# Patient Record
Sex: Male | Born: 1940 | Race: White | Hispanic: No | Marital: Married | Smoking: Never smoker
Health system: Southern US, Community
[De-identification: ages and names within clinical notes are randomized; demographics above are authoritative.]

## PROBLEM LIST (undated history)

## (undated) DIAGNOSIS — N200 Calculus of kidney: Secondary | ICD-10-CM

## (undated) DIAGNOSIS — M1990 Unspecified osteoarthritis, unspecified site: Secondary | ICD-10-CM

## (undated) DIAGNOSIS — I10 Essential (primary) hypertension: Secondary | ICD-10-CM

## (undated) DIAGNOSIS — E039 Hypothyroidism, unspecified: Secondary | ICD-10-CM

## (undated) DIAGNOSIS — E119 Type 2 diabetes mellitus without complications: Secondary | ICD-10-CM

## (undated) DIAGNOSIS — T8859XA Other complications of anesthesia, initial encounter: Secondary | ICD-10-CM

## (undated) DIAGNOSIS — K219 Gastro-esophageal reflux disease without esophagitis: Secondary | ICD-10-CM

## (undated) DIAGNOSIS — I4891 Unspecified atrial fibrillation: Secondary | ICD-10-CM

## (undated) DIAGNOSIS — N183 Chronic kidney disease, stage 3 unspecified: Secondary | ICD-10-CM

## (undated) DIAGNOSIS — I509 Heart failure, unspecified: Secondary | ICD-10-CM

## (undated) HISTORY — DX: Unspecified osteoarthritis, unspecified site: M19.90

## (undated) HISTORY — DX: Type 2 diabetes mellitus without complications: E11.9

---

## 1992-01-21 HISTORY — PX: NASAL FRACTURE SURGERY: SHX718

## 2004-08-26 ENCOUNTER — Ambulatory Visit: Payer: Self-pay | Admitting: Family Medicine

## 2006-12-15 ENCOUNTER — Ambulatory Visit: Payer: Self-pay | Admitting: Unknown Physician Specialty

## 2008-04-15 ENCOUNTER — Emergency Department (HOSPITAL_COMMUNITY): Admission: EM | Admit: 2008-04-15 | Discharge: 2008-04-16 | Payer: Self-pay | Admitting: Emergency Medicine

## 2008-12-11 ENCOUNTER — Ambulatory Visit: Payer: Self-pay | Admitting: Internal Medicine

## 2008-12-20 ENCOUNTER — Ambulatory Visit: Payer: Self-pay | Admitting: Internal Medicine

## 2008-12-29 ENCOUNTER — Ambulatory Visit: Payer: Self-pay | Admitting: Internal Medicine

## 2009-01-10 ENCOUNTER — Ambulatory Visit: Payer: Self-pay | Admitting: Internal Medicine

## 2009-01-24 ENCOUNTER — Ambulatory Visit: Payer: Self-pay | Admitting: Internal Medicine

## 2009-02-05 ENCOUNTER — Ambulatory Visit: Payer: Self-pay | Admitting: General Practice

## 2009-05-24 ENCOUNTER — Ambulatory Visit: Payer: Self-pay | Admitting: Urology

## 2010-05-02 LAB — URINALYSIS, ROUTINE W REFLEX MICROSCOPIC
Nitrite: NEGATIVE
Specific Gravity, Urine: 1.026 (ref 1.005–1.030)
Urobilinogen, UA: 0.2 mg/dL (ref 0.0–1.0)
pH: 5.5 (ref 5.0–8.0)

## 2010-05-02 LAB — POCT I-STAT, CHEM 8
Chloride: 105 mEq/L (ref 96–112)
Creatinine, Ser: 1.6 mg/dL — ABNORMAL HIGH (ref 0.4–1.5)
HCT: 39 % (ref 39.0–52.0)
Hemoglobin: 13.3 g/dL (ref 13.0–17.0)
Potassium: 3.8 mEq/L (ref 3.5–5.1)
Sodium: 140 mEq/L (ref 135–145)

## 2010-05-02 LAB — URINE MICROSCOPIC-ADD ON

## 2011-04-04 DIAGNOSIS — L299 Pruritus, unspecified: Secondary | ICD-10-CM | POA: Diagnosis not present

## 2011-04-04 DIAGNOSIS — L738 Other specified follicular disorders: Secondary | ICD-10-CM | POA: Diagnosis not present

## 2011-04-04 DIAGNOSIS — L280 Lichen simplex chronicus: Secondary | ICD-10-CM | POA: Diagnosis not present

## 2011-04-15 DIAGNOSIS — M25569 Pain in unspecified knee: Secondary | ICD-10-CM | POA: Diagnosis not present

## 2011-04-24 DIAGNOSIS — L280 Lichen simplex chronicus: Secondary | ICD-10-CM | POA: Diagnosis not present

## 2011-05-07 DIAGNOSIS — D649 Anemia, unspecified: Secondary | ICD-10-CM | POA: Diagnosis not present

## 2011-05-07 DIAGNOSIS — G4762 Sleep related leg cramps: Secondary | ICD-10-CM | POA: Diagnosis not present

## 2011-05-07 DIAGNOSIS — M2550 Pain in unspecified joint: Secondary | ICD-10-CM | POA: Diagnosis not present

## 2011-07-09 DIAGNOSIS — N401 Enlarged prostate with lower urinary tract symptoms: Secondary | ICD-10-CM | POA: Diagnosis not present

## 2011-07-09 DIAGNOSIS — N400 Enlarged prostate without lower urinary tract symptoms: Secondary | ICD-10-CM | POA: Diagnosis not present

## 2011-07-09 DIAGNOSIS — N529 Male erectile dysfunction, unspecified: Secondary | ICD-10-CM | POA: Diagnosis not present

## 2011-08-08 DIAGNOSIS — E119 Type 2 diabetes mellitus without complications: Secondary | ICD-10-CM | POA: Diagnosis not present

## 2011-11-10 DIAGNOSIS — E119 Type 2 diabetes mellitus without complications: Secondary | ICD-10-CM | POA: Diagnosis not present

## 2011-11-10 DIAGNOSIS — M549 Dorsalgia, unspecified: Secondary | ICD-10-CM | POA: Diagnosis not present

## 2011-11-12 DIAGNOSIS — H2510 Age-related nuclear cataract, unspecified eye: Secondary | ICD-10-CM | POA: Diagnosis not present

## 2012-03-19 DIAGNOSIS — R109 Unspecified abdominal pain: Secondary | ICD-10-CM | POA: Diagnosis not present

## 2012-03-19 DIAGNOSIS — E119 Type 2 diabetes mellitus without complications: Secondary | ICD-10-CM | POA: Diagnosis not present

## 2012-03-22 DIAGNOSIS — R195 Other fecal abnormalities: Secondary | ICD-10-CM | POA: Diagnosis not present

## 2012-03-22 DIAGNOSIS — K5730 Diverticulosis of large intestine without perforation or abscess without bleeding: Secondary | ICD-10-CM | POA: Diagnosis not present

## 2012-03-22 DIAGNOSIS — R1032 Left lower quadrant pain: Secondary | ICD-10-CM | POA: Diagnosis not present

## 2012-03-30 DIAGNOSIS — R7989 Other specified abnormal findings of blood chemistry: Secondary | ICD-10-CM | POA: Diagnosis not present

## 2012-03-30 DIAGNOSIS — R198 Other specified symptoms and signs involving the digestive system and abdomen: Secondary | ICD-10-CM | POA: Diagnosis not present

## 2012-03-30 DIAGNOSIS — R1032 Left lower quadrant pain: Secondary | ICD-10-CM | POA: Diagnosis not present

## 2012-03-30 DIAGNOSIS — R7982 Elevated C-reactive protein (CRP): Secondary | ICD-10-CM | POA: Diagnosis not present

## 2012-04-01 ENCOUNTER — Ambulatory Visit: Payer: Self-pay | Admitting: Unknown Physician Specialty

## 2012-04-01 DIAGNOSIS — N200 Calculus of kidney: Secondary | ICD-10-CM | POA: Diagnosis not present

## 2012-04-01 DIAGNOSIS — IMO0002 Reserved for concepts with insufficient information to code with codable children: Secondary | ICD-10-CM | POA: Diagnosis not present

## 2012-04-01 DIAGNOSIS — R198 Other specified symptoms and signs involving the digestive system and abdomen: Secondary | ICD-10-CM | POA: Diagnosis not present

## 2012-04-01 DIAGNOSIS — K4090 Unilateral inguinal hernia, without obstruction or gangrene, not specified as recurrent: Secondary | ICD-10-CM | POA: Diagnosis not present

## 2012-04-03 DIAGNOSIS — Z79899 Other long term (current) drug therapy: Secondary | ICD-10-CM | POA: Diagnosis not present

## 2012-04-06 DIAGNOSIS — N200 Calculus of kidney: Secondary | ICD-10-CM | POA: Diagnosis not present

## 2012-04-13 ENCOUNTER — Ambulatory Visit: Payer: Self-pay | Admitting: Urology

## 2012-04-13 DIAGNOSIS — N200 Calculus of kidney: Secondary | ICD-10-CM | POA: Diagnosis not present

## 2012-04-13 DIAGNOSIS — Z01812 Encounter for preprocedural laboratory examination: Secondary | ICD-10-CM | POA: Diagnosis not present

## 2012-04-13 LAB — BASIC METABOLIC PANEL
Chloride: 106 mmol/L (ref 98–107)
Co2: 30 mmol/L (ref 21–32)
EGFR (African American): >60
Osmolality: 282 (ref 275–301)
Sodium: 139 mmol/L (ref 136–145)

## 2012-04-15 ENCOUNTER — Ambulatory Visit: Payer: Self-pay | Admitting: Urology

## 2012-04-15 DIAGNOSIS — Z79899 Other long term (current) drug therapy: Secondary | ICD-10-CM | POA: Diagnosis not present

## 2012-04-15 DIAGNOSIS — N200 Calculus of kidney: Secondary | ICD-10-CM | POA: Diagnosis not present

## 2012-04-15 DIAGNOSIS — E119 Type 2 diabetes mellitus without complications: Secondary | ICD-10-CM | POA: Diagnosis not present

## 2012-04-15 DIAGNOSIS — N400 Enlarged prostate without lower urinary tract symptoms: Secondary | ICD-10-CM | POA: Diagnosis not present

## 2012-04-21 DIAGNOSIS — N200 Calculus of kidney: Secondary | ICD-10-CM | POA: Diagnosis not present

## 2012-05-10 DIAGNOSIS — R109 Unspecified abdominal pain: Secondary | ICD-10-CM | POA: Diagnosis not present

## 2012-05-10 DIAGNOSIS — E119 Type 2 diabetes mellitus without complications: Secondary | ICD-10-CM | POA: Diagnosis not present

## 2012-05-18 DIAGNOSIS — R944 Abnormal results of kidney function studies: Secondary | ICD-10-CM | POA: Diagnosis not present

## 2012-05-18 DIAGNOSIS — E119 Type 2 diabetes mellitus without complications: Secondary | ICD-10-CM | POA: Diagnosis not present

## 2012-05-18 DIAGNOSIS — R109 Unspecified abdominal pain: Secondary | ICD-10-CM | POA: Diagnosis not present

## 2012-05-18 DIAGNOSIS — D649 Anemia, unspecified: Secondary | ICD-10-CM | POA: Diagnosis not present

## 2012-05-26 DIAGNOSIS — Z0189 Encounter for other specified special examinations: Secondary | ICD-10-CM | POA: Diagnosis not present

## 2012-05-26 DIAGNOSIS — Z85828 Personal history of other malignant neoplasm of skin: Secondary | ICD-10-CM | POA: Diagnosis not present

## 2012-05-26 DIAGNOSIS — L570 Actinic keratosis: Secondary | ICD-10-CM | POA: Diagnosis not present

## 2012-05-26 DIAGNOSIS — L821 Other seborrheic keratosis: Secondary | ICD-10-CM | POA: Diagnosis not present

## 2012-06-09 DIAGNOSIS — D649 Anemia, unspecified: Secondary | ICD-10-CM | POA: Diagnosis not present

## 2012-06-09 DIAGNOSIS — R944 Abnormal results of kidney function studies: Secondary | ICD-10-CM | POA: Diagnosis not present

## 2012-08-09 DIAGNOSIS — D649 Anemia, unspecified: Secondary | ICD-10-CM | POA: Diagnosis not present

## 2012-08-09 DIAGNOSIS — E119 Type 2 diabetes mellitus without complications: Secondary | ICD-10-CM | POA: Diagnosis not present

## 2012-08-09 DIAGNOSIS — R109 Unspecified abdominal pain: Secondary | ICD-10-CM | POA: Diagnosis not present

## 2012-11-18 DIAGNOSIS — H2510 Age-related nuclear cataract, unspecified eye: Secondary | ICD-10-CM | POA: Diagnosis not present

## 2012-12-13 DIAGNOSIS — E119 Type 2 diabetes mellitus without complications: Secondary | ICD-10-CM | POA: Diagnosis not present

## 2012-12-13 DIAGNOSIS — M549 Dorsalgia, unspecified: Secondary | ICD-10-CM | POA: Diagnosis not present

## 2012-12-13 DIAGNOSIS — R109 Unspecified abdominal pain: Secondary | ICD-10-CM | POA: Diagnosis not present

## 2013-01-20 HISTORY — PX: URETEROLITHOTOMY: SHX71

## 2013-04-22 DIAGNOSIS — S01309A Unspecified open wound of unspecified ear, initial encounter: Secondary | ICD-10-CM | POA: Diagnosis not present

## 2013-04-22 DIAGNOSIS — Z23 Encounter for immunization: Secondary | ICD-10-CM | POA: Diagnosis not present

## 2013-04-22 DIAGNOSIS — J029 Acute pharyngitis, unspecified: Secondary | ICD-10-CM | POA: Diagnosis not present

## 2013-05-18 DIAGNOSIS — N200 Calculus of kidney: Secondary | ICD-10-CM | POA: Diagnosis not present

## 2013-05-18 DIAGNOSIS — N401 Enlarged prostate with lower urinary tract symptoms: Secondary | ICD-10-CM | POA: Diagnosis not present

## 2013-05-18 DIAGNOSIS — N138 Other obstructive and reflux uropathy: Secondary | ICD-10-CM | POA: Diagnosis not present

## 2013-06-08 DIAGNOSIS — E119 Type 2 diabetes mellitus without complications: Secondary | ICD-10-CM | POA: Diagnosis not present

## 2013-06-08 DIAGNOSIS — R109 Unspecified abdominal pain: Secondary | ICD-10-CM | POA: Diagnosis not present

## 2013-07-18 DIAGNOSIS — M5116 Intervertebral disc disorders with radiculopathy, lumbar region: Secondary | ICD-10-CM | POA: Insufficient documentation

## 2013-07-18 DIAGNOSIS — D638 Anemia in other chronic diseases classified elsewhere: Secondary | ICD-10-CM | POA: Insufficient documentation

## 2013-07-18 DIAGNOSIS — IMO0002 Reserved for concepts with insufficient information to code with codable children: Secondary | ICD-10-CM | POA: Diagnosis not present

## 2013-07-18 DIAGNOSIS — M47817 Spondylosis without myelopathy or radiculopathy, lumbosacral region: Secondary | ICD-10-CM | POA: Diagnosis not present

## 2013-07-18 DIAGNOSIS — M47816 Spondylosis without myelopathy or radiculopathy, lumbar region: Secondary | ICD-10-CM | POA: Insufficient documentation

## 2013-07-18 DIAGNOSIS — D649 Anemia, unspecified: Secondary | ICD-10-CM | POA: Insufficient documentation

## 2013-08-02 DIAGNOSIS — R109 Unspecified abdominal pain: Secondary | ICD-10-CM | POA: Diagnosis not present

## 2013-08-02 DIAGNOSIS — R319 Hematuria, unspecified: Secondary | ICD-10-CM | POA: Diagnosis not present

## 2013-08-02 DIAGNOSIS — N400 Enlarged prostate without lower urinary tract symptoms: Secondary | ICD-10-CM | POA: Diagnosis not present

## 2013-08-10 DIAGNOSIS — N200 Calculus of kidney: Secondary | ICD-10-CM | POA: Diagnosis not present

## 2013-08-11 ENCOUNTER — Ambulatory Visit: Payer: Self-pay | Admitting: Urology

## 2013-08-11 DIAGNOSIS — K5710 Diverticulosis of small intestine without perforation or abscess without bleeding: Secondary | ICD-10-CM | POA: Diagnosis not present

## 2013-08-11 DIAGNOSIS — R109 Unspecified abdominal pain: Secondary | ICD-10-CM | POA: Diagnosis not present

## 2013-08-11 DIAGNOSIS — N1330 Unspecified hydronephrosis: Secondary | ICD-10-CM | POA: Diagnosis not present

## 2013-08-11 DIAGNOSIS — E119 Type 2 diabetes mellitus without complications: Secondary | ICD-10-CM | POA: Diagnosis not present

## 2013-08-11 DIAGNOSIS — Z01810 Encounter for preprocedural cardiovascular examination: Secondary | ICD-10-CM | POA: Diagnosis not present

## 2013-08-11 DIAGNOSIS — Z01812 Encounter for preprocedural laboratory examination: Secondary | ICD-10-CM | POA: Diagnosis not present

## 2013-08-11 LAB — BASIC METABOLIC PANEL
ANION GAP: 6 — AB (ref 7–16)
BUN: 35 mg/dL — ABNORMAL HIGH (ref 7–18)
CALCIUM: 9 mg/dL (ref 8.5–10.1)
CHLORIDE: 102 mmol/L (ref 98–107)
CO2: 31 mmol/L (ref 21–32)
Creatinine: 2.11 mg/dL — ABNORMAL HIGH (ref 0.60–1.30)
GFR CALC AF AMER: 35 — AB
GFR CALC NON AF AMER: 30 — AB
Glucose: 129 mg/dL — ABNORMAL HIGH (ref 65–99)
Osmolality: 287 (ref 275–301)
POTASSIUM: 4 mmol/L (ref 3.5–5.1)
SODIUM: 139 mmol/L (ref 136–145)

## 2013-08-15 ENCOUNTER — Inpatient Hospital Stay: Payer: Self-pay | Admitting: Internal Medicine

## 2013-08-15 DIAGNOSIS — I10 Essential (primary) hypertension: Secondary | ICD-10-CM | POA: Diagnosis not present

## 2013-08-15 DIAGNOSIS — N400 Enlarged prostate without lower urinary tract symptoms: Secondary | ICD-10-CM | POA: Diagnosis not present

## 2013-08-15 DIAGNOSIS — E119 Type 2 diabetes mellitus without complications: Secondary | ICD-10-CM | POA: Diagnosis present

## 2013-08-15 DIAGNOSIS — N201 Calculus of ureter: Secondary | ICD-10-CM | POA: Diagnosis not present

## 2013-08-15 DIAGNOSIS — M129 Arthropathy, unspecified: Secondary | ICD-10-CM | POA: Diagnosis present

## 2013-08-15 DIAGNOSIS — N1330 Unspecified hydronephrosis: Secondary | ICD-10-CM | POA: Diagnosis not present

## 2013-08-15 DIAGNOSIS — N179 Acute kidney failure, unspecified: Secondary | ICD-10-CM | POA: Diagnosis not present

## 2013-08-15 DIAGNOSIS — Z4682 Encounter for fitting and adjustment of non-vascular catheter: Secondary | ICD-10-CM | POA: Diagnosis not present

## 2013-08-15 DIAGNOSIS — J9600 Acute respiratory failure, unspecified whether with hypoxia or hypercapnia: Secondary | ICD-10-CM | POA: Diagnosis not present

## 2013-08-15 DIAGNOSIS — R918 Other nonspecific abnormal finding of lung field: Secondary | ICD-10-CM | POA: Diagnosis not present

## 2013-08-15 DIAGNOSIS — J95821 Acute postprocedural respiratory failure: Secondary | ICD-10-CM | POA: Diagnosis present

## 2013-08-15 LAB — CBC WITH DIFFERENTIAL/PLATELET
Basophil #: 0 10*3/uL (ref 0.0–0.1)
Basophil %: 0.5 %
Eosinophil #: 0.1 10*3/uL (ref 0.0–0.7)
Eosinophil %: 0.7 %
HCT: 38 % — ABNORMAL LOW (ref 40.0–52.0)
HGB: 12.6 g/dL — ABNORMAL LOW (ref 13.0–18.0)
Lymphocyte #: 0.5 10*3/uL — ABNORMAL LOW (ref 1.0–3.6)
Lymphocyte %: 4.7 %
MCH: 31.6 pg (ref 26.0–34.0)
MCHC: 33.1 g/dL (ref 32.0–36.0)
MCV: 96 fL (ref 80–100)
Monocyte #: 0.3 x10 3/mm (ref 0.2–1.0)
Monocyte %: 2.5 %
Neutrophil #: 9.6 10*3/uL — ABNORMAL HIGH (ref 1.4–6.5)
Neutrophil %: 91.6 %
Platelet: 290 10*3/uL (ref 150–440)
RBC: 3.98 10*6/uL — ABNORMAL LOW (ref 4.40–5.90)
RDW: 13.1 % (ref 11.5–14.5)
WBC: 10.5 10*3/uL (ref 3.8–10.6)

## 2013-08-15 LAB — BASIC METABOLIC PANEL
Anion Gap: 9 (ref 7–16)
BUN: 18 mg/dL (ref 7–18)
CALCIUM: 8.4 mg/dL — AB (ref 8.5–10.1)
CHLORIDE: 106 mmol/L (ref 98–107)
Co2: 23 mmol/L (ref 21–32)
Creatinine: 1.58 mg/dL — ABNORMAL HIGH (ref 0.60–1.30)
EGFR (Non-African Amer.): 43 — ABNORMAL LOW
GFR CALC AF AMER: 50 — AB
Glucose: 169 mg/dL — ABNORMAL HIGH (ref 65–99)
Osmolality: 281 (ref 275–301)
Potassium: 3.8 mmol/L (ref 3.5–5.1)
Sodium: 138 mmol/L (ref 136–145)

## 2013-08-15 LAB — MAGNESIUM: Magnesium: 1.8 mg/dL

## 2013-08-16 LAB — BASIC METABOLIC PANEL
Anion Gap: 13 (ref 7–16)
BUN: 23 mg/dL — AB (ref 7–18)
Calcium, Total: 8.3 mg/dL — ABNORMAL LOW (ref 8.5–10.1)
Chloride: 104 mmol/L (ref 98–107)
Co2: 20 mmol/L — ABNORMAL LOW (ref 21–32)
Creatinine: 1.84 mg/dL — ABNORMAL HIGH (ref 0.60–1.30)
EGFR (African American): 42 — ABNORMAL LOW
GFR CALC NON AF AMER: 36 — AB
Glucose: 227 mg/dL — ABNORMAL HIGH (ref 65–99)
Osmolality: 285 (ref 275–301)
Potassium: 4.1 mmol/L (ref 3.5–5.1)
Sodium: 137 mmol/L (ref 136–145)

## 2013-08-16 LAB — CBC WITH DIFFERENTIAL/PLATELET
Basophil #: 0 10*3/uL (ref 0.0–0.1)
Basophil %: 0.1 %
Eosinophil #: 0 10*3/uL (ref 0.0–0.7)
Eosinophil %: 0.1 %
HCT: 34.7 % — AB (ref 40.0–52.0)
HGB: 12 g/dL — AB (ref 13.0–18.0)
LYMPHS PCT: 4 %
Lymphocyte #: 0.4 10*3/uL — ABNORMAL LOW (ref 1.0–3.6)
MCH: 32.4 pg (ref 26.0–34.0)
MCHC: 34.4 g/dL (ref 32.0–36.0)
MCV: 94 fL (ref 80–100)
Monocyte #: 0.2 x10 3/mm (ref 0.2–1.0)
Monocyte %: 1.8 %
NEUTROS PCT: 94 %
Neutrophil #: 8.3 10*3/uL — ABNORMAL HIGH (ref 1.4–6.5)
PLATELETS: 298 10*3/uL (ref 150–440)
RBC: 3.69 10*6/uL — ABNORMAL LOW (ref 4.40–5.90)
RDW: 13 % (ref 11.5–14.5)
WBC: 8.9 10*3/uL (ref 3.8–10.6)

## 2013-08-17 LAB — BASIC METABOLIC PANEL
ANION GAP: 10 (ref 7–16)
BUN: 30 mg/dL — AB (ref 7–18)
CALCIUM: 8.7 mg/dL (ref 8.5–10.1)
CHLORIDE: 107 mmol/L (ref 98–107)
Co2: 21 mmol/L (ref 21–32)
Creatinine: 1.74 mg/dL — ABNORMAL HIGH (ref 0.60–1.30)
GFR CALC AF AMER: 44 — AB
GFR CALC NON AF AMER: 38 — AB
GLUCOSE: 163 mg/dL — AB (ref 65–99)
OSMOLALITY: 285 (ref 275–301)
Potassium: 4.1 mmol/L (ref 3.5–5.1)
Sodium: 138 mmol/L (ref 136–145)

## 2013-08-22 DIAGNOSIS — N201 Calculus of ureter: Secondary | ICD-10-CM | POA: Diagnosis not present

## 2013-08-22 DIAGNOSIS — N1330 Unspecified hydronephrosis: Secondary | ICD-10-CM | POA: Diagnosis not present

## 2013-08-22 DIAGNOSIS — N401 Enlarged prostate with lower urinary tract symptoms: Secondary | ICD-10-CM | POA: Diagnosis not present

## 2013-09-05 DIAGNOSIS — R109 Unspecified abdominal pain: Secondary | ICD-10-CM | POA: Diagnosis not present

## 2013-09-05 DIAGNOSIS — E119 Type 2 diabetes mellitus without complications: Secondary | ICD-10-CM | POA: Diagnosis not present

## 2013-09-05 DIAGNOSIS — R5381 Other malaise: Secondary | ICD-10-CM | POA: Diagnosis not present

## 2013-11-09 DIAGNOSIS — E119 Type 2 diabetes mellitus without complications: Secondary | ICD-10-CM | POA: Diagnosis not present

## 2013-11-09 DIAGNOSIS — Z1389 Encounter for screening for other disorder: Secondary | ICD-10-CM | POA: Diagnosis not present

## 2013-11-09 DIAGNOSIS — R109 Unspecified abdominal pain: Secondary | ICD-10-CM | POA: Diagnosis not present

## 2013-11-09 DIAGNOSIS — M549 Dorsalgia, unspecified: Secondary | ICD-10-CM | POA: Diagnosis not present

## 2013-11-09 DIAGNOSIS — R296 Repeated falls: Secondary | ICD-10-CM | POA: Diagnosis not present

## 2013-11-21 DIAGNOSIS — E139 Other specified diabetes mellitus without complications: Secondary | ICD-10-CM | POA: Diagnosis not present

## 2014-02-21 DIAGNOSIS — M25561 Pain in right knee: Secondary | ICD-10-CM | POA: Diagnosis not present

## 2014-04-17 DIAGNOSIS — R109 Unspecified abdominal pain: Secondary | ICD-10-CM | POA: Diagnosis not present

## 2014-04-17 DIAGNOSIS — E119 Type 2 diabetes mellitus without complications: Secondary | ICD-10-CM | POA: Diagnosis not present

## 2014-05-13 NOTE — H&P (Signed)
PATIENT NAME:  SHOGO, LARKEY MR#:  [id number] DATE OF BIRTH:  06-16-1940  DATE OF ADMISSION:  08/15/2013  REFERRING PHYSICIAN:  Dr. Maryan Puls.   CHIEF COMPLAINT: Respiratory distress.   HISTORY OF PRESENT ILLNESS: A 74 year old male with past history of hypertension, benign prostatic hypertrophy and  diabetes, who had renal stone issues on the right side and was scheduled for outpatient surgery, taken for the procedure today and stenting in the right side ureter was done by Dr. Maryan Puls. Postprocedure, the patient had respiratory distress and had some stridor, as per the nursing staff and anesthesiologist, and so finally they decided to intubate the patient and put him on the ventilator machine and medical consult was called in for further management of this issue. History obtained from patient's wife and daughter and nursing staff and postsurgical unit.    REVIEW OF SYSTEMS:  Unable to get as the patient is intubated on ventilator.   PAST MEDICAL HISTORY: 1.  Benign prostatic hypertrophy.  2.  Renal stone.  3.  Diabetes.  4.  Arthritis.   SOCIAL HISTORY: Obtained from patient's wife; denies smoking, drinking alcohol or doing any drugs. Lives active and healthy life with family.   FAMILY HISTORY: Positive for emphysema in the patient's mother who was a known smoker.   HOME MEDICATIONS:  1.  Tramadol 50 mg oral tablet once a day.  2.  TheraLith XR multivitamin, 2 tablets once a day.  3.  Tamsulosin 0.4 mg oral once a day.  4.  Ondansetron 8 mg oral every 4 hours.  5.  Omeprazole 20 mg oral once a day.  6.  Nucynta 50 mg oral tablet every 6 hours.  7.  Multivitamin with minerals once a day.  8.  Metformin 500 mg oral 2 times a day.  9.  Hyoscine 0.125 mg oral sublingual as needed.  10.  Docusate sodium 50 mg oral 2 times a day.  11.  Acetaminophen 500 mg oral tablet every 6 hours as needed for pain.   PHYSICAL EXAMINATION: VITAL SIGNS: In ER, temperature 97.6, pulse 68,  respirations 18, blood pressure 145/79. Pulse ox is 100% on ventilator support.  GENERAL: The patient is on ventilator and slightly responsive to stimuli. He received sedation medications approximately 30 minutes before my examination and not on any IV drip for sedation. I examined the patient in postprocedure in PACU.  HEENT: Head and neck atraumatic. Conjunctivae pink. Oral mucosa moist. ET tube in place.  RESPIRATORY: Bilateral equal air entry. No crepitation. No wheezing.  CARDIOVASCULAR: S1, S2 present, regular. No murmur.  ABDOMEN: Soft, nontender. Bowel sounds normal. No organomegaly felt.  SKIN: No rashes.  EXTREMITIES:  Legs: No edema.  NEUROLOGICAL: Minimal response to stimuli under the effect of sedating medications after intubation. No rigidity or tremor or abnormal movements noted.  PSYCHIATRIC: Unable to assess because the patient is intubated.   IMPORTANT LABORATORY RESULTS: Glucose 169, BUN 18, creatinine 1.58, sodium 138. Potassium is 3.8. Chloride is 106, CO2 of 23, osmolality 281 and calcium is 8.4. WBC 10.5, hemoglobin 12.6; platelet 290. MCV is 96. On ABG, pH is 7.38. PCO2 is 40 and pO2 is 44 on 40% oxygen; most likely looks like venous blood.   CHEST X-RAY, PORTABLE, SINGLE VIEW: Endotracheal tube noted 6.5 cm above carina.  No acute cardiopulmonary disease. Stable cardiomegaly.   ASSESSMENT AND PLAN: A 74 year old male with history of diabetes, benign prostatic hypertrophy, admitted to medical service after having some respiratory distress postprocedure and so  intubated.  1.  Acute respiratory failure. The patient does not have any history of COPD or respiratory-related issues. He was not a smoker, and in the past, during the procedures like colonoscopy, he had sedating medications, but never had this type of issue before. Maybe this is the effect of anesthetic medications or might have some reaction to some medicines, which is unclear at this time. He is on ventilator and  currently stable. We will continue the same. Start him on some medication for sedation like propofol and keep him in CCU. Pulmonary consult to help with weaning the ventilator support and extubation tomorrow. Discussed with family. Meanwhile, we will give him IV Solu-Medrol and nebulizer treatment to help him, as the nurses noticed some stridor and he received racemic epinephrine postprocedure.  2.  Diabetes. As the patient is intubated on ventilator, will be on steroid. We will just keep him on fingerstick checks and coverage.  3.  Acute renal failure, likely this is secondary to right renal stone and blockage. He is postprocedure now, and we will continue monitoring the renal function.  4.  Benign prostatic hypertrophy. Continue Flomax.  5.  Arthritis. Currently hold all pain medications.   CODE STATUS: FULL CODE.   Total time spent on this admission is 50 minutes critical care.   Plan explained to patient's family in CCU waiting area.    ____________________________ Ceasar Lund. Anselm Jungling, MD vgv:dmm D: 08/15/2013 21:10:00 ET T: 08/15/2013 22:05:58 ET JOB#: [id number]  cc: Ceasar Lund. Anselm Jungling, MD, <Dictator> Vaughan Basta MD ELECTRONICALLY SIGNED 08/16/2013 16:18

## 2014-05-13 NOTE — Op Note (Signed)
PATIENT NAME:  Billy Franco, GERMER MR#:  [id number] DATE OF BIRTH:  April 12, 1940  DATE OF PROCEDURE:  08/15/2013  PREOPERATIVE DIAGNOSIS: Right hydronephrosis.   POSTOPERATIVE DIAGNOSES:  1.  Right hydronephrosis.  2.  Right ureterolithiasis.   PROCEDURES:  1.  Right ureteroscopic ureterolithotomy with holmium laser lithotripsy.  2.  Right retrograde pyelogram.  3.  Right double pigtail stent placement.  4.  Fluoroscopy.   SURGEON: Otelia Limes. Yves Dill, MD   ANESTHETIST: Amy M. Rice, MD  ANESTHETIC METHOD: General.   INDICATIONS: See the dictated history and physical. After informed consent, the patient requests the above procedure.   OPERATIVE SUMMARY: After adequate general anesthesia had been obtained, the patient was placed into dorsal lithotomy position, and the perineum was prepped and draped in the usual fashion. A 21-French cystoscope was coupled with the camera and then visually advanced into the bladder. The patient had numerous less than 1-mm stone fragments present in the bladder. The right ureteral orifice was then cannulated with an 8-French cone-tipped ureteral catheter and retrograde pyelography was performed using fluoroscopy. The patient had 2 stones obstructing the right ureter. The most proximal stone was located at the level of L2 and measured 10 x 10 mm. The distal stone measured 6 x 6 mm and was located in the distal ureter. At this point,  a 0.035 Glidewire was then advanced into fluoroscopically up the right ureter, beyond the stones, and curled into the kidney. A 7 mm x 4 cm balloon ureteral dilating catheter was then advanced over the guidewire and positioned in the intramural ureter. The intramural ureter was then dilated up to 7 mm. The balloon dilating catheter was then removed, taking care to leave the guidewire in position. The cystoscope was also removed. The mini Storz ureteroscope was then coupled with the camera and visually advanced into the ureter. The most distal  stone was encountered. The 325 micron holmium laser fiber was then passed through the scope, and the stone was engaged and fully disintegrated. The ureteroscope was then advanced to the more proximal stone and in likewise fashion it was disintegrated with the holmium laser. At this point, ureteroscope was removed and the cystoscope back loaded over the guidewire. A 6 x 26 cm double pigtail stent with suture attached distally was advanced over the guidewire and positioned in the ureter. Guidewire was then removed taking care to leave the stent in position. The bladder was drained and cystoscope was removed. 10 mL of viscous Xylocaine was instilled within the urethra and the bladder. B and O suppository was placed. The procedure was then terminated and the patient was transferred to the recovery room in stable condition.     ____________________________ Otelia Limes. Yves Dill, MD mrw:am D: 08/15/2013 15:32:01 ET T: 08/16/2013 03:00:49 ET JOB#: [id number]  cc: Otelia Limes. Yves Dill, MD, <Dictator> Royston Cowper MD ELECTRONICALLY SIGNED 08/17/2013 8:45

## 2014-05-13 NOTE — Discharge Summary (Signed)
PATIENT NAME:  Billy Franco, Billy Franco MR#:  [id number] DATE OF BIRTH:  Aug 04, 1940  DATE OF ADMISSION:  08/15/2013 DATE OF DISCHARGE:  08/17/2013  FINAL DIAGNOSES: 1.  Acute respiratory failure.  2.  Right ureterolithiasis, hydronephrosis, acute renal failure.  3.  Weakness.  4.  Diabetes.   MEDICATIONS ON DISCHARGE: Include acetaminophen 500 mg every 6 hours as needed for pain, Colace 50 mg twice a day as needed for constipation, multivitamin with minerals 1 tablet at bedtime, omeprazole 20 mg at bedtime, Flomax 0.4 mg at bedtime, tramadol 50 mg once a day as needed for pain, Nucynta 50 mg q. 6 hours as needed for pain, TheraLith XR multivitamin with minerals 2 tablets once a day, Zofran 8 mg disintegrating tablet every 4 hours as needed for nausea and vomiting, hyoscyamine 125 mg sublingually every 4-6 hours as needed for bladder spasm. Stop taking metformin.  TREATMENT:  Strain urine, save stone.  Do not pull on string.  DIET:  Carbohydrate-controlled diet.  Eat light for the first meal. Diet consistency:  Regular consistency.   REFERRAL: Dr. Yves Dill 1, 1-2 weeks, Dr. Randel Books.   HOSPITAL COURSE: The patient was admitted to the hospital on 08/15/2013 for respiratory distress after procedure by Dr. Yves Dill.  The patient was intubated. No history of respiratory issues, but of note, he did have a tight anatomy and was unable to have an endoscopy a few years back. The patient was admitted to the CCU and watched overnight on the ventilator.  High-dose steroids were given just in case inflammation. The patient was extubated on 08/16/2013, respiratory status stable on 08/17/2013.  Lungs were clear. The patient was discharged home.   LABORATORY AND RADIOLOGICAL DATA: On July 27,  showed a pH of 7.38, pCO2 of 40, pO2 of 44, oxygen saturation 86% on the ventilator. Chest x-ray showed ET tube 6.4 cm above the carina. No acute cardiopulmonary disease. Stable cardiomegaly. Magnesium 1.8, white blood cell count 10.5, H  and H 12.6 and 38.0, platelet count of 290,000.  Glucose 169, BUN 18, creatinine 1.58, sodium 138, potassium 3.8, chloride 106, CO2 23, calcium 8.4. Creatinine upon discharge 1.74, glucose 141.   HOSPITAL COURSE PER PROBLEM LIST:  1.  For the patient's acute respiratory failure on the ventilator on July 27, extubated on July 28, on room air on July 29, stable for discharge.  2.  For the patient's acute renal, right ureterolithiasis and hydronephrosis. The patient was brought to the OR by Dr. Yves Dill on July 27, for right ureteroscopic ureterolithotomy with holmium laser lithotripsy and stent placement under fluoroscopy. Please see operative report for that. As per Dr. Yves Dill, everything went well with the procedure and from a urology standpoint, was stable for discharge when they were medically stable for discharge. Creatinine still borderline discharge so I stopped the patient's metformin. Recommend checking a BMP as an outpatient.  3.  Weakness: PT recommended outpatient physical therapy services which the patient refused.  4.  Diabetes. I held the patient's metformin. Sugars have been good during the hospital course off the Glucophage.  TIME SPENT ON DISCHARGE: 35 minutes.    ____________________________ Tana Conch. Leslye Peer, MD rjw:ds D: 08/17/2013 15:44:14 ET T: 08/17/2013 16:00:05 ET JOB#: [id number]  cc: Tana Conch. Leslye Peer, MD, <Dictator> Marisue Brooklyn MD ELECTRONICALLY SIGNED 08/18/2013 15:46

## 2014-05-13 NOTE — H&P (Signed)
PATIENT NAME:  Billy Franco, TURNEY MR#:  [id number] DATE OF BIRTH:  1940/09/18  DATE OF ADMISSION: 08/11/2013.   DATE OF PLANNED SURGERY: 08/15/2012.   CHIEF COMPLAINT: Right flank pain.   HISTORY OF PRESENT ILLNESS: Billy Franco is a 74 year old white male who presented to the office July 14 with a 10-day history of intermittent right flank pain and hematuria. He was found to have high-grade obstruction of the right ureter on IVP and dilated intramural ureter on cystoscopy. He most likely has a distal ureteral stone. He comes in now for right retrograde pyelogram with ureteroscopic ureterolithotomy with holmium laser lithotripsy.   ALLERGIES: No drug allergies.   CURRENT MEDICATIONS: Include: Tylenol, Colace, metformin, multivitamins, omeprazole, Flomax, tramadol, Nucynta, and TheraLith XR.   PAST SURGICAL HISTORY:  Vasectomy in 1985, ESWL 2011, ESWL 2014.  Left knee repair January 2015.   PAST AND CURRENT MEDICAL CONDITIONS:  1.  Diabetes.  2.  GERD.   REVIEW OF SYSTEMS: The patient denies chest pain, shortness of breath, stroke, or heart disease.   PHYSICAL EXAMINATION:  GENERAL: Elderly white male in no acute distress.  HEENT: Sclerae were clear. Pupils were equally round, reactive to light and accommodation. Extraocular movements were intact.  NECK: Supple. No palpable cervical adenopathy. No audible carotid bruits.  LUNGS: Clear to auscultation.  CARDIOVASCULAR: Regular rhythm and rate without audible murmurs.  ABDOMEN: Soft, nontender abdomen.  GENITOURINARY: Uncircumcised.  Testes atrophic, 18-cc size each.  RECTAL: 40 grams, smooth nontender prostate.  NEUROMUSCULAR: Alert and oriented x 3.   IMPRESSION: Right hydronephrosis due to right distal stone.   PLAN: Cystoscopy with right retrograde pyelogram and ureteroscopic ureterolithotomy with holmium laser lithotripsy.    ____________________________ Otelia Limes. Yves Dill, MD mrw:lt D: 08/11/2013 11:37:00 ET T: 08/11/2013  12:12:01 ET JOB#: [id number]  cc: Otelia Limes. Yves Dill, MD, <Dictator> Royston Cowper MD ELECTRONICALLY SIGNED 08/11/2013 19:09

## 2014-05-19 DIAGNOSIS — N401 Enlarged prostate with lower urinary tract symptoms: Secondary | ICD-10-CM | POA: Diagnosis not present

## 2014-05-19 DIAGNOSIS — N138 Other obstructive and reflux uropathy: Secondary | ICD-10-CM | POA: Diagnosis not present

## 2014-05-22 DIAGNOSIS — R1013 Epigastric pain: Secondary | ICD-10-CM | POA: Diagnosis not present

## 2014-05-22 DIAGNOSIS — E119 Type 2 diabetes mellitus without complications: Secondary | ICD-10-CM | POA: Diagnosis not present

## 2014-05-29 DIAGNOSIS — T6591XA Toxic effect of unspecified substance, accidental (unintentional), initial encounter: Secondary | ICD-10-CM | POA: Diagnosis not present

## 2014-05-29 DIAGNOSIS — T304 Corrosion of unspecified body region, unspecified degree: Secondary | ICD-10-CM | POA: Diagnosis not present

## 2014-07-10 DIAGNOSIS — L570 Actinic keratosis: Secondary | ICD-10-CM | POA: Diagnosis not present

## 2014-07-10 DIAGNOSIS — D489 Neoplasm of uncertain behavior, unspecified: Secondary | ICD-10-CM | POA: Diagnosis not present

## 2014-07-10 DIAGNOSIS — D0422 Carcinoma in situ of skin of left ear and external auricular canal: Secondary | ICD-10-CM | POA: Diagnosis not present

## 2014-07-31 ENCOUNTER — Encounter: Payer: Self-pay | Admitting: Family Medicine

## 2014-07-31 ENCOUNTER — Ambulatory Visit (INDEPENDENT_AMBULATORY_CARE_PROVIDER_SITE_OTHER): Payer: Medicare Other | Admitting: Family Medicine

## 2014-07-31 VITALS — BP 110/60 | HR 65 | Temp 97.6°F | Resp 17 | Ht 69.0 in | Wt 168.9 lb

## 2014-07-31 DIAGNOSIS — E119 Type 2 diabetes mellitus without complications: Secondary | ICD-10-CM

## 2014-07-31 DIAGNOSIS — M545 Low back pain, unspecified: Secondary | ICD-10-CM | POA: Insufficient documentation

## 2014-07-31 DIAGNOSIS — R748 Abnormal levels of other serum enzymes: Secondary | ICD-10-CM

## 2014-07-31 DIAGNOSIS — E785 Hyperlipidemia, unspecified: Secondary | ICD-10-CM | POA: Diagnosis not present

## 2014-07-31 DIAGNOSIS — K219 Gastro-esophageal reflux disease without esophagitis: Secondary | ICD-10-CM | POA: Diagnosis not present

## 2014-07-31 DIAGNOSIS — E1169 Type 2 diabetes mellitus with other specified complication: Secondary | ICD-10-CM

## 2014-07-31 DIAGNOSIS — G8929 Other chronic pain: Secondary | ICD-10-CM | POA: Diagnosis not present

## 2014-07-31 DIAGNOSIS — R7989 Other specified abnormal findings of blood chemistry: Secondary | ICD-10-CM | POA: Insufficient documentation

## 2014-07-31 MED ORDER — OMEPRAZOLE 20 MG PO CPDR: 20.0000 mg | DELAYED_RELEASE_CAPSULE | Freq: Every day | ORAL | Status: DC

## 2014-07-31 MED ORDER — METFORMIN HCL 500 MG PO TABS: 500.0000 mg | ORAL_TABLET | Freq: Two times a day (BID) | ORAL | Status: DC

## 2014-07-31 NOTE — Progress Notes (Signed)
Name: Billy Franco   MRN: [id number]    DOB: 1940/11/26   Date:07/31/2014       Progress Note  Subjective  Chief Complaint  Chief Complaint  Patient presents with  . Follow-up    2 mo  . Diabetes  . Back Pain  . Fatigue  . Abdominal Pain    Diabetes He presents for his follow-up diabetic visit. He has type 2 diabetes mellitus. His disease course has been stable. Pertinent negatives for diabetes include no chest pain, no fatigue, no polydipsia and no polyuria. Diabetic complications include nephropathy. Pertinent negatives for diabetic complications include no CVA or heart disease. Current diabetic treatment includes oral agent (monotherapy). His breakfast blood glucose range is generally 110-130 mg/dl.  Heartburn He reports no abdominal pain, no belching, no chest pain, no coughing, no dysphagia, no heartburn or no sore throat. This is a chronic problem. Pertinent negatives include no fatigue. He has tried a PPI for the symptoms. Past procedures do not include an EGD.  Patient is requesting a DMV handicap placard form to be signed for continuation of his placard. He has been using the placard authorized initially by Dr. Jacqualine Code.    Past Medical History  Diagnosis Date  . Diabetes mellitus without complication   . Arthritis     Past Surgical History  Procedure Laterality Date  . Nasal fracture surgery  1994  . Ureterolithotomy Right 2015    Family History  Problem Relation Age of Onset  . Heart disease Father     History   Social History  . Marital Status: Married    Spouse Name: N/A  . Number of Children: N/A  . Years of Education: N/A   Occupational History  . Not on file.   Social History Main Topics  . Smoking status: Never Smoker   . Smokeless tobacco: Never Used  . Alcohol Use: No  . Drug Use: No  . Sexual Activity: No   Other Topics Concern  . Not on file   Social History Narrative  . No narrative on file     Current outpatient prescriptions:    .  acetaminophen (TYLENOL) 500 MG tablet, Take by mouth., Disp: , Rfl:  .  BLOOD GLUCOSE MONITORING SUPPL, ONETOUCH ULTRA (Device) - Historical Medication Active, Disp: , Rfl:  .  docusate sodium (COLACE) 50 MG capsule, Take by mouth., Disp: , Rfl:  .  glucose blood (ONE TOUCH ULTRA TEST) test strip, , Disp: , Rfl:  .  metFORMIN (GLUCOPHAGE) 500 MG tablet, Take 1 tablet (500 mg total) by mouth 2 (two) times daily with a meal., Disp: 180 tablet, Rfl: 0 .  MULTIPLE VITAMIN PO, Take by mouth., Disp: , Rfl:  .  Nutritional Supplements (THERALITH XR) TABS, Take by mouth., Disp: , Rfl:  .  omeprazole (PRILOSEC) 20 MG capsule, Take 1 capsule (20 mg total) by mouth daily., Disp: 90 capsule, Rfl: 0 .  ONETOUCH DELICA LANCETS 74B MISC, , Disp: , Rfl:  .  silver sulfADIAZINE (SILVADENE) 1 % cream, , Disp: , Rfl:  .  sulfamethoxazole-trimethoprim (BACTRIM DS,SEPTRA DS) 800-160 MG per tablet, , Disp: , Rfl:  .  tamsulosin (FLOMAX) 0.4 MG CAPS capsule, Take 1 capsule by mouth daily., Disp: , Rfl:  .  tapentadol (NUCYNTA) 50 MG TABS tablet, Take 1 tablet by mouth as needed., Disp: , Rfl:  .  traMADol (ULTRAM) 50 MG tablet, 1 po qday prn, Disp: , Rfl:   Allergies  Allergen Reactions  . Codeine  Rash    Per pt "hard on my kidneys"   . Lovastatin Rash     Review of Systems  Constitutional: Negative for fatigue.  HENT: Negative for sore throat.   Respiratory: Negative for cough.   Cardiovascular: Negative for chest pain.  Gastrointestinal: Negative for heartburn, dysphagia and abdominal pain.  Musculoskeletal: Positive for back pain and joint pain.  Endo/Heme/Allergies: Negative for polydipsia.      Objective  Filed Vitals:   07/31/14 0909  BP: 110/60  Pulse: 65  Temp: 97.6 F (36.4 C)  TempSrc: Oral  Resp: 17  Height: 5\' 9"  (1.753 m)  Weight: 168 lb 14.4 oz (76.613 kg)  SpO2: 98%    Physical Exam  Constitutional: He is oriented to person, place, and time and well-developed,  well-nourished, and in no distress.  HENT:  Head: Normocephalic and atraumatic.  Cardiovascular: Normal rate and regular rhythm.   Pulmonary/Chest: Effort normal and breath sounds normal.  Abdominal: Soft. Bowel sounds are normal.  Musculoskeletal:       Lumbar back: He exhibits pain.       Back:  Neurological: He is alert and oriented to person, place, and time.  Nursing note and vitals reviewed.      Assessment & Plan 1. Diabetes mellitus type 2, controlled  - HgB A1c - metFORMIN (GLUCOPHAGE) 500 MG tablet; Take 1 tablet (500 mg total) by mouth 2 (two) times daily with a meal.  Dispense: 180 tablet; Refill: 0  2. Dyslipidemia associated with type 2 diabetes mellitus  - Lipid Profile  3. Elevated serum creatinine  - Comprehensive metabolic panel  4. Gastroesophageal reflux disease, esophagitis presence not specified  - omeprazole (PRILOSEC) 20 MG capsule; Take 1 capsule (20 mg total) by mouth daily.  Dispense: 90 capsule; Refill: 0  5. Chronic low back pain Records reviewed from last PCP including referral to a specialist. 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 Sharlet Salina. DMV form completed and signed.   [name] Asad A. [facility] 07/31/2014 7:09 PM

## 2014-08-01 LAB — COMPREHENSIVE METABOLIC PANEL
ALK PHOS: 112 IU/L (ref 39–117)
ALT: 19 IU/L (ref 0–44)
AST: 19 IU/L (ref 0–40)
Albumin/Globulin Ratio: 1.6 (ref 1.1–2.5)
Albumin: 4.6 g/dL (ref 3.5–4.8)
BUN/Creatinine Ratio: 17 (ref 10–22)
BUN: 18 mg/dL (ref 8–27)
Bilirubin Total: 0.6 mg/dL (ref 0.0–1.2)
CHLORIDE: 102 mmol/L (ref 97–108)
CO2: 25 mmol/L (ref 18–29)
Calcium: 9.7 mg/dL (ref 8.6–10.2)
Creatinine, Ser: 1.09 mg/dL (ref 0.76–1.27)
GFR, EST AFRICAN AMERICAN: 77 mL/min/{1.73_m2} (ref >59)
GFR, EST NON AFRICAN AMERICAN: 67 mL/min/{1.73_m2} (ref >59)
GLOBULIN, TOTAL: 2.8 g/dL (ref 1.5–4.5)
GLUCOSE: 146 mg/dL — AB (ref 65–99)
POTASSIUM: 4.5 mmol/L (ref 3.5–5.2)
SODIUM: 142 mmol/L (ref 134–144)
Total Protein: 7.4 g/dL (ref 6.0–8.5)

## 2014-08-01 LAB — LIPID PANEL
Chol/HDL Ratio: 3.6 ratio units (ref 0.0–5.0)
Cholesterol, Total: 181 mg/dL (ref 100–199)
HDL: 50 mg/dL (ref >39)
LDL Calculated: 113 mg/dL — ABNORMAL HIGH (ref 0–99)
TRIGLYCERIDES: 92 mg/dL (ref 0–149)
VLDL CHOLESTEROL CAL: 18 mg/dL (ref 5–40)

## 2014-08-01 LAB — HEMOGLOBIN A1C
Est. average glucose Bld gHb Est-mCnc: 137 mg/dL
HEMOGLOBIN A1C: 6.4 % — AB (ref 4.8–5.6)

## 2014-08-28 DIAGNOSIS — D049 Carcinoma in situ of skin, unspecified: Secondary | ICD-10-CM | POA: Diagnosis not present

## 2014-10-23 DIAGNOSIS — D489 Neoplasm of uncertain behavior, unspecified: Secondary | ICD-10-CM | POA: Diagnosis not present

## 2014-10-23 DIAGNOSIS — Z85828 Personal history of other malignant neoplasm of skin: Secondary | ICD-10-CM | POA: Diagnosis not present

## 2014-10-23 DIAGNOSIS — L738 Other specified follicular disorders: Secondary | ICD-10-CM | POA: Diagnosis not present

## 2014-10-23 DIAGNOSIS — L570 Actinic keratosis: Secondary | ICD-10-CM | POA: Diagnosis not present

## 2014-10-30 ENCOUNTER — Ambulatory Visit (INDEPENDENT_AMBULATORY_CARE_PROVIDER_SITE_OTHER): Payer: Medicare Other | Admitting: Family Medicine

## 2014-10-30 ENCOUNTER — Encounter: Payer: Self-pay | Admitting: Family Medicine

## 2014-10-30 VITALS — BP 110/60 | HR 54 | Temp 97.7°F | Resp 21 | Ht 69.0 in | Wt 171.6 lb

## 2014-10-30 DIAGNOSIS — E119 Type 2 diabetes mellitus without complications: Secondary | ICD-10-CM

## 2014-10-30 DIAGNOSIS — K219 Gastro-esophageal reflux disease without esophagitis: Secondary | ICD-10-CM | POA: Diagnosis not present

## 2014-10-30 DIAGNOSIS — E1169 Type 2 diabetes mellitus with other specified complication: Secondary | ICD-10-CM

## 2014-10-30 DIAGNOSIS — M47816 Spondylosis without myelopathy or radiculopathy, lumbar region: Secondary | ICD-10-CM | POA: Diagnosis not present

## 2014-10-30 DIAGNOSIS — E785 Hyperlipidemia, unspecified: Secondary | ICD-10-CM | POA: Diagnosis not present

## 2014-10-30 DIAGNOSIS — Z9181 History of falling: Secondary | ICD-10-CM | POA: Insufficient documentation

## 2014-10-30 LAB — POCT GLYCOSYLATED HEMOGLOBIN (HGB A1C): HEMOGLOBIN A1C: 6.3

## 2014-10-30 MED ORDER — DICLOFENAC-MISOPROSTOL 50-0.2 MG PO TBEC: 1.0000 | DELAYED_RELEASE_TABLET | Freq: Two times a day (BID) | ORAL | Status: DC

## 2014-10-30 MED ORDER — METFORMIN HCL 500 MG PO TABS: 500.0000 mg | ORAL_TABLET | Freq: Two times a day (BID) | ORAL | Status: DC

## 2014-10-30 MED ORDER — OMEPRAZOLE 20 MG PO CPDR: 20.0000 mg | DELAYED_RELEASE_CAPSULE | Freq: Every day | ORAL | Status: DC

## 2014-10-30 NOTE — Progress Notes (Signed)
Name: Billy Franco   MRN: [id number]    DOB: May 05, 1940   Date:10/30/2014       Progress Note  Subjective  Chief Complaint  Chief Complaint  Patient presents with  . Follow-up    3 mo  . Diabetes  . Hyperlipidemia  . Medication Refill    metformin 500 mg / omeprazole 20 mg    Diabetes He presents for his follow-up diabetic visit. He has type 2 diabetes mellitus. His disease course has been stable. Pertinent negatives for diabetes include no chest pain and no weight loss. Current diabetic treatment includes oral agent (monotherapy). He is following a diabetic diet. His breakfast blood glucose range is generally 110-130 mg/dl. An ACE inhibitor/angiotensin II receptor blocker is not being taken. Eye exam is current.  Hyperlipidemia This is a chronic problem. Recent lipid tests were reviewed and are high. Pertinent negatives include no chest pain or shortness of breath. He is currently on no antihyperlipidemic treatment. Compliance problems: Pt. does not wish to be started on a stain therapy for primary prevention.   Heartburn He reports no chest pain, no choking, no coughing, no dysphagia, no heartburn or no nausea. This is a chronic problem. Pertinent negatives include no weight loss. He has tried a PPI for the symptoms. Past procedures include an EGD.  Arthritis Presents for follow-up visit. He complains of pain and stiffness. Affected locations include the neck. Pertinent negatives include no fever or weight loss. His past medical history is significant for osteoarthritis.   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 Patient has pain, stiffness, and decreased range of motion. He has in the past taken tramadol with no relief. He is requesting that a different medication be tried for his symptoms. Past Medical History  Diagnosis Date  . Diabetes mellitus without complication ([place])   . Arthritis    Past Surgical History   Procedure Laterality Date  . Nasal fracture surgery  1994  . Ureterolithotomy Right 2015    Family History  Problem Relation Age of Onset  . Heart disease Father     Social History   Social History  . Marital Status: Married    Spouse Name: N/A  . Number of Children: N/A  . Years of Education: N/A   Occupational History  . Not on file.   Social History Main Topics  . Smoking status: Never Smoker   . Smokeless tobacco: Never Used  . Alcohol Use: No  . Drug Use: No  . Sexual Activity: No   Other Topics Concern  . Not on file   Social History Narrative    Current outpatient prescriptions:  .  acetaminophen (TYLENOL) 500 MG tablet, Take by mouth., Disp: , Rfl:  .  BLOOD GLUCOSE MONITORING SUPPL, ONETOUCH ULTRA (Device) - Historical Medication Active, Disp: , Rfl:  .  docusate sodium (COLACE) 50 MG capsule, Take by mouth., Disp: , Rfl:  .  glucose blood (ONE TOUCH ULTRA TEST) test strip, , Disp: , Rfl:  .  metFORMIN (GLUCOPHAGE) 500 MG tablet, Take 1 tablet (500 mg total) by mouth 2 (two) times daily with a meal., Disp: 180 tablet, Rfl: 0 .  MULTIPLE VITAMIN PO, Take by mouth., Disp: , Rfl:  .  Nutritional Supplements (THERALITH XR) TABS, Take by mouth., Disp: , Rfl:  .  omeprazole (PRILOSEC) 20 MG capsule, Take 1 capsule (20 mg total) by mouth daily., Disp: 90 capsule, Rfl: 0 .  ONETOUCH DELICA  LANCETS 33G MISC, , Disp: , Rfl:  .  silver sulfADIAZINE (SILVADENE) 1 % cream, , Disp: , Rfl:  .  sulfamethoxazole-trimethoprim (BACTRIM DS,SEPTRA DS) 800-160 MG per tablet, , Disp: , Rfl:  .  tamsulosin (FLOMAX) 0.4 MG CAPS capsule, Take 1 capsule by mouth daily., Disp: , Rfl:  .  tapentadol (NUCYNTA) 50 MG TABS tablet, Take 1 tablet by mouth as needed., Disp: , Rfl:  .  traMADol (ULTRAM) 50 MG tablet, 1 po qday prn, Disp: , Rfl:   Allergies  Allergen Reactions  . Codeine Rash and Other (See Comments)    Per pt "hard on my kidneys"  Per pt "hard on my kidneys"  Per pt  "hard on my kidneys"   . Lovastatin Rash and Other (See Comments)   Review of Systems  Constitutional: Negative for fever, chills, weight loss and malaise/fatigue.  Respiratory: Negative for cough, choking and shortness of breath.   Cardiovascular: Negative for chest pain and palpitations.  Gastrointestinal: Negative for heartburn, dysphagia and nausea.  Musculoskeletal: Positive for arthritis and stiffness.   Objective  Filed Vitals:   10/30/14 0844  BP: 110/60  Pulse: 54  Temp: 97.7 F (36.5 C)  TempSrc: Oral  Resp: 21  Height: 5\' 9"  (1.753 m)  Weight: 171 lb 9.6 oz (77.837 kg)  SpO2: 98%   Physical Exam  Constitutional: He is well-developed, well-nourished, and in no distress.  Cardiovascular: Normal rate and regular rhythm.   Pulmonary/Chest: Effort normal and breath sounds normal.  Abdominal: Soft. Bowel sounds are normal.  Nursing note and vitals reviewed.  Assessment & Plan  1. Controlled type 2 diabetes mellitus without complication, without long-term current use of insulin (HCC)  A1c is 6.3%, consistent with well-controlled diabetes. Continue on daily metformin therapy and recheck in 4 months. - POCT HgB A1C - POCT Glucose (CBG) - metFORMIN (GLUCOPHAGE) 500 MG tablet; Take 1 tablet (500 mg total) by mouth 2 (two) times daily with a meal.  Dispense: 180 tablet; Refill: 0  2. Gastroesophageal reflux disease, esophagitis presence not specified Symptoms stable on daily PPI therapy. - omeprazole (PRILOSEC) 20 MG capsule; Take 1 capsule (20 mg total) by mouth daily.  Dispense: 90 capsule; Refill: 0  3. Dyslipidemia associated with type 2 diabetes mellitus ([place]) patient does not wish to be started on statin therapy at this time. Repeat fasting lipid panel and follow-up. - Lipid Profile - Comprehensive Metabolic Panel (CMET)  4. Spondylosis of lumbar region without myelopathy or radiculopathy Started patient on Arthrotec for relief of pain and stiffness due to  osteoarthritis. Recommended that the medicine be taken after meals. Continue PPI therapy. Patient educated on adverse effects. Recheck in one month. - Diclofenac-Misoprostol 50-0.2 MG TBEC; Take 1 tablet by mouth 2 (two) times daily after a meal.  Dispense: 60 tablet; Refill: 0    Oluwanifemi Petitti [facility] 10/30/2014 9:07 AM

## 2014-10-31 ENCOUNTER — Ambulatory Visit: Payer: Medicare Other | Admitting: Family Medicine

## 2014-10-31 LAB — LIPID PANEL
CHOL/HDL RATIO: 3.5 ratio (ref 0.0–5.0)
Cholesterol, Total: 175 mg/dL (ref 100–199)
HDL: 50 mg/dL (ref >39)
LDL Calculated: 98 mg/dL (ref 0–99)
Triglycerides: 134 mg/dL (ref 0–149)
VLDL Cholesterol Cal: 27 mg/dL (ref 5–40)

## 2014-10-31 LAB — COMPREHENSIVE METABOLIC PANEL
ALBUMIN: 4.4 g/dL (ref 3.5–4.8)
ALK PHOS: 114 IU/L (ref 39–117)
ALT: 20 IU/L (ref 0–44)
AST: 21 IU/L (ref 0–40)
Albumin/Globulin Ratio: 1.5 (ref 1.1–2.5)
BUN / CREAT RATIO: 19 (ref 10–22)
BUN: 22 mg/dL (ref 8–27)
Bilirubin Total: 0.5 mg/dL (ref 0.0–1.2)
CO2: 27 mmol/L (ref 18–29)
CREATININE: 1.14 mg/dL (ref 0.76–1.27)
Calcium: 10.2 mg/dL (ref 8.6–10.2)
Chloride: 100 mmol/L (ref 97–108)
GFR calc non Af Amer: 63 mL/min/{1.73_m2} (ref >59)
GFR, EST AFRICAN AMERICAN: 73 mL/min/{1.73_m2} (ref >59)
GLUCOSE: 151 mg/dL — AB (ref 65–99)
Globulin, Total: 2.9 g/dL (ref 1.5–4.5)
Potassium: 4.7 mmol/L (ref 3.5–5.2)
Sodium: 140 mmol/L (ref 134–144)
TOTAL PROTEIN: 7.3 g/dL (ref 6.0–8.5)

## 2014-11-09 ENCOUNTER — Telehealth: Payer: Self-pay | Admitting: Family Medicine

## 2014-11-09 DIAGNOSIS — E1169 Type 2 diabetes mellitus with other specified complication: Secondary | ICD-10-CM

## 2014-11-09 DIAGNOSIS — E785 Hyperlipidemia, unspecified: Principal | ICD-10-CM

## 2014-11-09 MED ORDER — ATORVASTATIN CALCIUM 10 MG PO TABS: 10.0000 mg | ORAL_TABLET | Freq: Every day | ORAL | Status: DC

## 2014-11-09 NOTE — Telephone Encounter (Signed)
Routed to Dr. Shah for return call 

## 2014-11-09 NOTE — Telephone Encounter (Signed)
Patient received a call from Dr Manuella Ghazi last night but they where not home. States that Billy Franco (husband) has decided to go ahead and try the cholesterol medication that was suggested. Please send to [place]. Also patient has decided wait on the arthritis prescription. Appointment has been changed to 11-17-14 @ 8:45 to make sure the new cholesterol medication is in his system.

## 2014-11-09 NOTE — Telephone Encounter (Signed)
Spoke with patient's wife and discussed statin therapy for patient for primary prevention. We will start on Lipitor 10 mg at bedtime.

## 2014-11-10 NOTE — Telephone Encounter (Signed)
Dr. Manuella Ghazi has spoken with patient's wife and discussed statin therapy for patient for primary prevention. We will start on Lipitor 10 mg at bedtime.

## 2014-11-17 DIAGNOSIS — T2020XA Burn of second degree of head, face, and neck, unspecified site, initial encounter: Secondary | ICD-10-CM | POA: Diagnosis not present

## 2014-11-17 DIAGNOSIS — T22212A Burn of second degree of left forearm, initial encounter: Secondary | ICD-10-CM | POA: Diagnosis not present

## 2014-11-18 ENCOUNTER — Emergency Department
Admission: EM | Admit: 2014-11-18 | Discharge: 2014-11-18 | Disposition: A | Discharge status: Home / Self Care | Payer: Medicare Other | Attending: Emergency Medicine | Admitting: Emergency Medicine

## 2014-11-18 DIAGNOSIS — T22011A Burn of unspecified degree of right forearm, initial encounter: Secondary | ICD-10-CM | POA: Diagnosis present

## 2014-11-18 DIAGNOSIS — T2029XA Burn of second degree of multiple sites of head, face, and neck, initial encounter: Secondary | ICD-10-CM | POA: Diagnosis not present

## 2014-11-18 DIAGNOSIS — Y9289 Other specified places as the place of occurrence of the external cause: Secondary | ICD-10-CM | POA: Insufficient documentation

## 2014-11-18 DIAGNOSIS — X030XXA Exposure to flames in controlled fire, not in building or structure, initial encounter: Secondary | ICD-10-CM | POA: Insufficient documentation

## 2014-11-18 DIAGNOSIS — Y998 Other external cause status: Secondary | ICD-10-CM | POA: Diagnosis not present

## 2014-11-18 DIAGNOSIS — Z79899 Other long term (current) drug therapy: Secondary | ICD-10-CM | POA: Insufficient documentation

## 2014-11-18 DIAGNOSIS — E119 Type 2 diabetes mellitus without complications: Secondary | ICD-10-CM | POA: Insufficient documentation

## 2014-11-18 DIAGNOSIS — Y9389 Activity, other specified: Secondary | ICD-10-CM | POA: Diagnosis not present

## 2014-11-18 DIAGNOSIS — Z791 Long term (current) use of non-steroidal anti-inflammatories (NSAID): Secondary | ICD-10-CM | POA: Insufficient documentation

## 2014-11-18 DIAGNOSIS — T2020XA Burn of second degree of head, face, and neck, unspecified site, initial encounter: Secondary | ICD-10-CM | POA: Diagnosis not present

## 2014-11-18 DIAGNOSIS — T22211A Burn of second degree of right forearm, initial encounter: Secondary | ICD-10-CM | POA: Insufficient documentation

## 2014-11-18 DIAGNOSIS — Z792 Long term (current) use of antibiotics: Secondary | ICD-10-CM | POA: Diagnosis not present

## 2014-11-18 MED ORDER — MUPIROCIN 2 % EX OINT: TOPICAL_OINTMENT | CUTANEOUS | Status: DC
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
## 2014-11-18 NOTE — Discharge Instructions (Signed)
Burn Care °Your skin is a natural barrier to infection. It is the largest organ of your body. Burns damage this natural protection. To help prevent infection, it is very important to follow your caregiver's instructions in the care of your burn. °Burns are classified as: °· First degree. There is only redness of the skin (erythema). No scarring is expected. °· Second degree. There is blistering of the skin. Scarring may occur with deeper burns. °· Third degree. All layers of the skin are injured, and scarring is expected. °HOME CARE INSTRUCTIONS  °· Wash your hands well before changing your bandage. °· Change your bandage as often as directed by your caregiver. °· Remove the old bandage. If the bandage sticks, you may soak it off with cool, clean water. °· Cleanse the burn thoroughly but gently with mild soap and water. °· Pat the area dry with a clean, dry cloth. °· Apply a thin layer of antibacterial cream to the burn. °· Apply a clean bandage as instructed by your caregiver. °· Keep the bandage as clean and dry as possible. °· Elevate the affected area for the first 24 hours, then as instructed by your caregiver. °· Only take over-the-counter or prescription medicines for pain, discomfort, or fever as directed by your caregiver. °SEEK IMMEDIATE MEDICAL CARE IF:  °· You develop excessive pain. °· You develop redness, tenderness, swelling, or red streaks near the burn. °· The burned area develops yellowish-white fluid (pus) or a bad smell. °· You have a fever. °MAKE SURE YOU:  °· Understand these instructions. °· Will watch your condition. °· Will get help right away if you are not doing well or get worse. °  °This information is not intended to replace advice given to you by your health care provider. Make sure you discuss any questions you have with your health care provider. °  °Document Released: 01/06/2005 Document Revised: 03/31/2011 Document Reviewed: 05/29/2010 °Elsevier Interactive Patient Education ©2016  [employer] ° °Second-Degree Burn °A second-degree burn affects the 2 outer layers of skin. The outer layer (epidermis) and the layer underneath it (dermis) are both burned. Another name for this type of burn is a partial thickness burn. A second-degree burn may be called minor or major. This depends on the size of the burn. It also depends on what parts of the skin are burned. Minor burns may be treated with first aid. Major burns are a medical emergency. °A second-degree burn is worse than a first-degree burn, but not as bad as a third-degree burn. A first-degree burn affects only the epidermis. A third-degree burn goes through all the layers of skin. A second-degree burn usually heals in 3 to 4 weeks. A minor second-degree burn usually does not leave a scar. Deeper second-degree burns may lead to scarring of the skin or contractures over joints. Contractures are scars that form over joints and may lead to reduced mobility at those joints. °CAUSES °· Heat (thermal) injury. This happens when skin comes in contact with something very hot. It could be a flame, a hot object, hot liquid, or steam. Most second-degree burns are thermal injuries. °· Radiation. Sunlight is one type of radiation that can burn the skin. Another type of radiation is used to heat food. Radiation is also used to treat some diseases, such as cancer. All types of radiation can burn the skin. Sunlight usually causes a first-degree burn. Radiation used for heating food or treating a disease can cause a second-degree burn. °· Electricity. Electrical burns can cause more   damage under the skin than on the surface. They should always be treated as major burns. °· Chemicals. Many chemicals can burn the skin. The burn should be flushed with cool water and checked by an emergency caregiver. °SYMPTOMS °Symptoms of second-degree burns include: °· Severe pain. °· Extreme tenderness. °· Deep redness. °· Blistered skin. °· Skin that has changed color. It  might look blotchy, wet, or shiny. °· Swelling. °TREATMENT °Some second-degree burns may need to be treated in a hospital. These include major burns, electrical burns, and chemical burns. Many other second-degree burns can be treated with regular first aid, such as: °· Cooling the burn. Use cool, germ-free (sterile) salt water. Place the burned area of skin into a tub of water, or cover the burned area with clean, wet towels. °· Taking pain medicine. °· Removing the dead skin from broken blisters. A trained caregiver may do this. Do not pop blisters. °· Gently washing your skin with mild soap. °· Covering the burned area with a cream. Silver sulfadiazine is a cream for burns. An antibiotic cream, such as bacitracin, may also be used to fight infection. Do not use other ointments or creams unless your caregiver says it is okay. °· Protecting the burn with a sterile, non-sticky bandage. °· Bandaging fingers and toes separately. This keeps them from sticking together. °· Taking an antibiotic. This can help prevent infection. °· Getting a tetanus shot. °HOME CARE INSTRUCTIONS °Medication °· Take any medicine prescribed by your caregiver. Follow the directions carefully. °· Ask your caregiver if you can take over-the-counter medicine to relieve pain and swelling. Do not give aspirin to children. °· Make sure your caregiver knows about all other medicines you take. This includes over-the-counter medicines. °Burn care °· You will need to change the bandage on your burn. You may need to do this 2 or 3 times each day. °¨ Gently clean the burned area. °¨ Put ointment on it. °¨ Cover the burn with a sterile bandage. °· For some deeper burns or burns that cover a large area, compression garments may be prescribed. These garments can help minimize scarring and protect your mobility. °· Do not put butter or oil on your skin. Use only the cream prescribed by your caregiver. °· Do not put ice on your burn. °· Do not break blisters  on your skin. °· Keep the bandaged area dry. You might need to take a sponge bath for awhile. Ask your caregiver when you can take a shower or a tub bath again. °· Do not scratch an itchy burn. Your caregiver may give you medicine to relieve very bad itching. °· Infection is a big danger after a second-degree burn. Tell your caregiver right away if you have signs of infection, such as: °¨ Redness or changing color in the burned area. °¨ Fluid leaking from the burn. °¨ Swelling in the burn area. °¨ A bad smell coming from the wound. °Follow-up °· Keep all follow-up appointments. This is important. This is how your caregiver can tell if your treatment is working. °· Protect your burn from sunlight. Use sunscreen whenever you go outside. Burned areas may be sensitive to the sun for up to 1 year. Exposure to the sun may also cause permanent darkening of scars. °SEEK MEDICAL CARE IF: °· You have any questions about medicines. °· You have any questions about your treatment. °· You wonder if it is okay to do a particular activity. °· You develop a fever of more than 100.5° F (38.1° C). °SEEK IMMEDIATE MEDICAL CARE IF: °·   You think your burn might be infected. It may change color, become red, leak fluid, swell, or smell bad. °· You develop a fever of more than 102° F (38.9° C). °  °This information is not intended to replace advice given to you by your health care provider. Make sure you discuss any questions you have with your health care provider. °  °Document Released: 06/10/2010 Document Revised: 03/31/2011 Document Reviewed: 06/10/2010 °Elsevier Interactive Patient Education ©2016 [employer] ° °

## 2014-11-20 DIAGNOSIS — T2027XD Burn of second degree of neck, subsequent encounter: Secondary | ICD-10-CM | POA: Diagnosis not present

## 2014-11-20 DIAGNOSIS — E785 Hyperlipidemia, unspecified: Secondary | ICD-10-CM | POA: Diagnosis not present

## 2014-11-20 DIAGNOSIS — T2000XD Burn of unspecified degree of head, face, and neck, unspecified site, subsequent encounter: Secondary | ICD-10-CM | POA: Diagnosis not present

## 2014-11-20 DIAGNOSIS — T310 Burns involving less than 10% of body surface: Secondary | ICD-10-CM | POA: Diagnosis not present

## 2014-11-20 DIAGNOSIS — Z5189 Encounter for other specified aftercare: Secondary | ICD-10-CM | POA: Diagnosis not present

## 2014-11-20 DIAGNOSIS — T22231A Burn of second degree of right upper arm, initial encounter: Secondary | ICD-10-CM | POA: Diagnosis not present

## 2014-11-20 DIAGNOSIS — I998 Other disorder of circulatory system: Secondary | ICD-10-CM | POA: Diagnosis not present

## 2014-11-20 DIAGNOSIS — E119 Type 2 diabetes mellitus without complications: Secondary | ICD-10-CM | POA: Diagnosis not present

## 2014-11-20 DIAGNOSIS — T20211D Burn of second degree of right ear [any part, except ear drum], subsequent encounter: Secondary | ICD-10-CM | POA: Diagnosis not present

## 2014-11-20 DIAGNOSIS — T2020XA Burn of second degree of head, face, and neck, unspecified site, initial encounter: Secondary | ICD-10-CM | POA: Diagnosis not present

## 2014-11-20 DIAGNOSIS — K219 Gastro-esophageal reflux disease without esophagitis: Secondary | ICD-10-CM | POA: Diagnosis not present

## 2014-11-20 DIAGNOSIS — M1991 Primary osteoarthritis, unspecified site: Secondary | ICD-10-CM | POA: Diagnosis not present

## 2014-11-20 DIAGNOSIS — Z87828 Personal history of other (healed) physical injury and trauma: Secondary | ICD-10-CM | POA: Insufficient documentation

## 2014-11-27 DIAGNOSIS — K219 Gastro-esophageal reflux disease without esophagitis: Secondary | ICD-10-CM | POA: Diagnosis not present

## 2014-11-27 DIAGNOSIS — T2027XD Burn of second degree of neck, subsequent encounter: Secondary | ICD-10-CM | POA: Diagnosis not present

## 2014-11-27 DIAGNOSIS — I998 Other disorder of circulatory system: Secondary | ICD-10-CM | POA: Diagnosis not present

## 2014-11-27 DIAGNOSIS — E785 Hyperlipidemia, unspecified: Secondary | ICD-10-CM | POA: Diagnosis not present

## 2014-11-27 DIAGNOSIS — T2026XD Burn of second degree of forehead and cheek, subsequent encounter: Secondary | ICD-10-CM | POA: Diagnosis not present

## 2014-11-27 DIAGNOSIS — T310 Burns involving less than 10% of body surface: Secondary | ICD-10-CM | POA: Diagnosis not present

## 2014-11-27 DIAGNOSIS — M1990 Unspecified osteoarthritis, unspecified site: Secondary | ICD-10-CM | POA: Diagnosis not present

## 2014-11-27 DIAGNOSIS — E119 Type 2 diabetes mellitus without complications: Secondary | ICD-10-CM | POA: Diagnosis not present

## 2014-11-27 DIAGNOSIS — T20211D Burn of second degree of right ear [any part, except ear drum], subsequent encounter: Secondary | ICD-10-CM | POA: Diagnosis not present

## 2014-11-27 DIAGNOSIS — Z6826 Body mass index (BMI) 26.0-26.9, adult: Secondary | ICD-10-CM | POA: Diagnosis not present

## 2014-11-27 DIAGNOSIS — T22231A Burn of second degree of right upper arm, initial encounter: Secondary | ICD-10-CM | POA: Diagnosis not present

## 2014-11-27 DIAGNOSIS — T2020XD Burn of second degree of head, face, and neck, unspecified site, subsequent encounter: Secondary | ICD-10-CM | POA: Diagnosis not present

## 2014-11-27 DIAGNOSIS — T22031D Burn of unspecified degree of right upper arm, subsequent encounter: Secondary | ICD-10-CM | POA: Diagnosis not present

## 2014-12-04 ENCOUNTER — Ambulatory Visit: Payer: Medicare Other | Admitting: Family Medicine

## 2014-12-04 DIAGNOSIS — T22231A Burn of second degree of right upper arm, initial encounter: Secondary | ICD-10-CM | POA: Diagnosis not present

## 2014-12-04 DIAGNOSIS — T22231S Burn of second degree of right upper arm, sequela: Secondary | ICD-10-CM | POA: Diagnosis not present

## 2014-12-04 DIAGNOSIS — L905 Scar conditions and fibrosis of skin: Secondary | ICD-10-CM | POA: Diagnosis not present

## 2014-12-04 DIAGNOSIS — E119 Type 2 diabetes mellitus without complications: Secondary | ICD-10-CM | POA: Diagnosis not present

## 2014-12-04 DIAGNOSIS — T2020XS Burn of second degree of head, face, and neck, unspecified site, sequela: Secondary | ICD-10-CM | POA: Diagnosis not present

## 2014-12-04 DIAGNOSIS — T2020XD Burn of second degree of head, face, and neck, unspecified site, subsequent encounter: Secondary | ICD-10-CM | POA: Diagnosis not present

## 2014-12-04 DIAGNOSIS — E785 Hyperlipidemia, unspecified: Secondary | ICD-10-CM | POA: Diagnosis not present

## 2014-12-04 DIAGNOSIS — I998 Other disorder of circulatory system: Secondary | ICD-10-CM | POA: Diagnosis not present

## 2014-12-04 DIAGNOSIS — Z6826 Body mass index (BMI) 26.0-26.9, adult: Secondary | ICD-10-CM | POA: Diagnosis not present

## 2014-12-18 ENCOUNTER — Ambulatory Visit: Payer: Medicare Other | Admitting: Family Medicine

## 2014-12-18 DIAGNOSIS — T22231A Burn of second degree of right upper arm, initial encounter: Secondary | ICD-10-CM | POA: Diagnosis not present

## 2014-12-18 DIAGNOSIS — T2007XA Burn of unspecified degree of neck, initial encounter: Secondary | ICD-10-CM | POA: Diagnosis not present

## 2014-12-18 DIAGNOSIS — E119 Type 2 diabetes mellitus without complications: Secondary | ICD-10-CM | POA: Diagnosis not present

## 2014-12-18 DIAGNOSIS — B369 Superficial mycosis, unspecified: Secondary | ICD-10-CM | POA: Diagnosis not present

## 2014-12-18 DIAGNOSIS — E785 Hyperlipidemia, unspecified: Secondary | ICD-10-CM | POA: Diagnosis not present

## 2014-12-18 DIAGNOSIS — K219 Gastro-esophageal reflux disease without esophagitis: Secondary | ICD-10-CM | POA: Diagnosis not present

## 2014-12-18 DIAGNOSIS — Z6826 Body mass index (BMI) 26.0-26.9, adult: Secondary | ICD-10-CM | POA: Diagnosis not present

## 2014-12-18 DIAGNOSIS — T2230XA Burn of third degree of shoulder and upper limb, except wrist and hand, unspecified site, initial encounter: Secondary | ICD-10-CM | POA: Diagnosis not present

## 2014-12-18 DIAGNOSIS — M1990 Unspecified osteoarthritis, unspecified site: Secondary | ICD-10-CM | POA: Diagnosis not present

## 2014-12-18 DIAGNOSIS — T20011A Burn of unspecified degree of right ear [any part, except ear drum], initial encounter: Secondary | ICD-10-CM | POA: Diagnosis not present

## 2014-12-18 DIAGNOSIS — T310 Burns involving less than 10% of body surface: Secondary | ICD-10-CM | POA: Diagnosis not present

## 2014-12-18 DIAGNOSIS — T2020XA Burn of second degree of head, face, and neck, unspecified site, initial encounter: Secondary | ICD-10-CM | POA: Diagnosis not present

## 2014-12-20 ENCOUNTER — Encounter: Payer: Self-pay | Admitting: Family Medicine

## 2014-12-20 ENCOUNTER — Ambulatory Visit (INDEPENDENT_AMBULATORY_CARE_PROVIDER_SITE_OTHER): Payer: Medicare Other | Admitting: Family Medicine

## 2014-12-20 VITALS — BP 118/70 | HR 79 | Temp 98.2°F | Resp 17 | Ht 67.0 in | Wt 167.6 lb

## 2014-12-20 DIAGNOSIS — E785 Hyperlipidemia, unspecified: Secondary | ICD-10-CM | POA: Diagnosis not present

## 2014-12-20 DIAGNOSIS — K219 Gastro-esophageal reflux disease without esophagitis: Secondary | ICD-10-CM

## 2014-12-20 DIAGNOSIS — E119 Type 2 diabetes mellitus without complications: Secondary | ICD-10-CM

## 2014-12-20 DIAGNOSIS — E1169 Type 2 diabetes mellitus with other specified complication: Secondary | ICD-10-CM | POA: Diagnosis not present

## 2014-12-20 MED ORDER — ATORVASTATIN CALCIUM 10 MG PO TABS: 10.0000 mg | ORAL_TABLET | Freq: Every day | ORAL | Status: DC

## 2014-12-20 MED ORDER — OMEPRAZOLE 20 MG PO CPDR: 20.0000 mg | DELAYED_RELEASE_CAPSULE | Freq: Every day | ORAL | Status: DC

## 2014-12-20 MED ORDER — METFORMIN HCL 500 MG PO TABS: 500.0000 mg | ORAL_TABLET | Freq: Two times a day (BID) | ORAL | Status: DC

## 2014-12-20 NOTE — Progress Notes (Signed)
Name: Billy Franco   MRN: [id number]    DOB: 1940-10-05   Date:12/20/2014       Progress Note  Subjective  Chief Complaint  Chief Complaint  Patient presents with  . Follow-up    1 mo  . Diabetes  . Hyperlipidemia  . Medication Refill    atorvastatin 10 mg / metformin 500 mg / omeprazole 20 mg    Diabetes He presents for his follow-up diabetic visit. He has type 2 diabetes mellitus. His disease course has been stable. There are no hypoglycemic associated symptoms. Pertinent negatives for diabetes include no chest pain. Current diabetic treatment includes oral agent (monotherapy). He is following a generally healthy diet. His breakfast blood glucose range is generally 110-130 mg/dl.  Hyperlipidemia This is a chronic problem. The problem is controlled. Recent lipid tests were reviewed and are normal. Pertinent negatives include no chest pain, leg pain, myalgias or shortness of breath. Current antihyperlipidemic treatment includes statins.  Heartburn He reports no abdominal pain, no chest pain, no coughing, no dysphagia, no heartburn, no nausea or no sore throat. This is a chronic problem. The problem has been unchanged. He has tried a PPI for the symptoms. The treatment provided significant relief.    Past Medical History  Diagnosis Date  . Diabetes mellitus without complication ([place])   . Arthritis     Past Surgical History  Procedure Laterality Date  . Nasal fracture surgery  1994  . Ureterolithotomy Right 2015    Family History  Problem Relation Age of Onset  . Heart disease Father     Social History   Social History  . Marital Status: Married    Spouse Name: N/A  . Number of Children: N/A  . Years of Education: N/A   Occupational History  . Not on file.   Social History Main Topics  . Smoking status: Never Smoker   . Smokeless tobacco: Never Used  . Alcohol Use: No  . Drug Use: No  . Sexual Activity: No   Other Topics Concern  . Not on file   Social  History Narrative     Current outpatient prescriptions:  .  acetaminophen (TYLENOL) 500 MG tablet, Take by mouth., Disp: , Rfl:  .  atorvastatin (LIPITOR) 10 MG tablet, Take 1 tablet (10 mg total) by mouth daily at 6 PM., Disp: 90 tablet, Rfl: 0 .  BLOOD GLUCOSE MONITORING SUPPL, ONETOUCH ULTRA (Device) - Historical Medication Active, Disp: , Rfl:  .  Diclofenac-Misoprostol 50-0.2 MG TBEC, Take 1 tablet by mouth 2 (two) times daily after a meal., Disp: 60 tablet, Rfl: 0 .  docusate sodium (COLACE) 50 MG capsule, Take by mouth., Disp: , Rfl:  .  doxycycline (VIBRAMYCIN) 100 MG capsule, Take 1 capsule by mouth 2 (two) times daily., Disp: , Rfl:  .  glucose blood (ONE TOUCH ULTRA TEST) test strip, , Disp: , Rfl:  .  HYDROcodone-acetaminophen (NORCO/VICODIN) 5-325 MG tablet, Take 1 tablet by mouth every 4 (four) hours as needed., Disp: , Rfl:  .  metFORMIN (GLUCOPHAGE) 500 MG tablet, Take 1 tablet (500 mg total) by mouth 2 (two) times daily with a meal., Disp: 180 tablet, Rfl: 0 .  MULTIPLE VITAMIN PO, Take by mouth., Disp: , Rfl:  .  mupirocin ointment (BACTROBAN) 2 %, Apply to affected area 3 times daily, Disp: 22 g, Rfl: 0 .  Nutritional Supplements (THERALITH XR) TABS, Take by mouth., Disp: , Rfl:  .  omeprazole (PRILOSEC) 20 MG capsule, Take 1 capsule (  20 mg total) by mouth daily., Disp: 90 capsule, Rfl: 0 .  ONETOUCH DELICA LANCETS [id number] MISC, , Disp: , Rfl:  .  silver sulfADIAZINE (SILVADENE) 1 % cream, , Disp: , Rfl:  .  tamsulosin (FLOMAX) 0.4 MG CAPS capsule, Take 1 capsule by mouth daily., Disp: , Rfl:  .  tapentadol (NUCYNTA) 50 MG TABS tablet, Take 1 tablet by mouth as needed., Disp: , Rfl:   Allergies  Allergen Reactions  . Codeine Rash and Other (See Comments)    Per pt "hard on my kidneys"  Per pt "hard on my kidneys"  Per pt "hard on my kidneys"   . Lovastatin Rash and Other (See Comments)     Review of Systems  Constitutional: Negative for fever and chills.  HENT:  Negative for sore throat.   Respiratory: Negative for cough and shortness of breath.   Cardiovascular: Negative for chest pain.  Gastrointestinal: Negative for heartburn, dysphagia, nausea, vomiting and abdominal pain.  Musculoskeletal: Negative for myalgias.     Objective  Filed Vitals:   12/20/14 0911  BP: 118/70  Pulse: 79  Temp: 98.2 F (36.8 C)  TempSrc: Oral  Resp: 17  Height: 5\' 7"  (1.702 m)  Weight: 167 lb 9.6 oz (76.023 kg)  SpO2: 96%    Physical Exam  Constitutional: He is oriented to person, place, and time and well-developed, well-nourished, and in no distress.  HENT:  Head: Normocephalic and atraumatic.  Cardiovascular: Normal rate, regular rhythm and normal heart sounds.   Pulmonary/Chest: Effort normal and breath sounds normal.  Abdominal: Soft. Bowel sounds are normal.  Musculoskeletal:       Right ankle: He exhibits no swelling.       Left ankle: He exhibits no swelling.  Neurological: He is alert and oriented to person, place, and time.  Nursing note and vitals reviewed.  Assessment & Plan  1. Gastroesophageal reflux disease, esophagitis presence not specified Symptoms responsive to therapy. Continue. - omeprazole (PRILOSEC) 20 MG capsule; Take 1 capsule (20 mg total) by mouth daily.  Dispense: 90 capsule; Refill: 0  2. Controlled type 2 diabetes mellitus without complication, without long-term current use of insulin (HCC) Stable and controlled on therapy. - metFORMIN (GLUCOPHAGE) 500 MG tablet; Take 1 tablet (500 mg total) by mouth 2 (two) times daily with a meal.  Dispense: 180 tablet; Refill: 0  3. Dyslipidemia associated with type 2 diabetes mellitus ([place]) Recheck FLP in March 2017. - atorvastatin (LIPITOR) 10 MG tablet; Take 1 tablet (10 mg total) by mouth daily at 6 PM.  Dispense: 90 tablet; Refill: 0   Billy Franco A. [facility] 12/20/2014 9:22 AM

## 2015-01-01 DIAGNOSIS — T2230XA Burn of third degree of shoulder and upper limb, except wrist and hand, unspecified site, initial encounter: Secondary | ICD-10-CM | POA: Diagnosis not present

## 2015-01-01 DIAGNOSIS — T310 Burns involving less than 10% of body surface: Secondary | ICD-10-CM | POA: Diagnosis not present

## 2015-01-01 DIAGNOSIS — Z6826 Body mass index (BMI) 26.0-26.9, adult: Secondary | ICD-10-CM | POA: Diagnosis not present

## 2015-01-01 DIAGNOSIS — T2230XD Burn of third degree of shoulder and upper limb, except wrist and hand, unspecified site, subsequent encounter: Secondary | ICD-10-CM | POA: Diagnosis not present

## 2015-02-14 DIAGNOSIS — E119 Type 2 diabetes mellitus without complications: Secondary | ICD-10-CM | POA: Diagnosis not present

## 2015-02-19 DIAGNOSIS — R609 Edema, unspecified: Secondary | ICD-10-CM | POA: Diagnosis not present

## 2015-02-19 DIAGNOSIS — T2020XD Burn of second degree of head, face, and neck, unspecified site, subsequent encounter: Secondary | ICD-10-CM | POA: Diagnosis not present

## 2015-02-19 DIAGNOSIS — M1990 Unspecified osteoarthritis, unspecified site: Secondary | ICD-10-CM | POA: Diagnosis not present

## 2015-02-19 DIAGNOSIS — T22231A Burn of second degree of right upper arm, initial encounter: Secondary | ICD-10-CM | POA: Diagnosis not present

## 2015-02-19 DIAGNOSIS — Z7984 Long term (current) use of oral hypoglycemic drugs: Secondary | ICD-10-CM | POA: Diagnosis not present

## 2015-02-19 DIAGNOSIS — L905 Scar conditions and fibrosis of skin: Secondary | ICD-10-CM | POA: Diagnosis not present

## 2015-02-19 DIAGNOSIS — E119 Type 2 diabetes mellitus without complications: Secondary | ICD-10-CM | POA: Diagnosis not present

## 2015-02-19 DIAGNOSIS — T22231D Burn of second degree of right upper arm, subsequent encounter: Secondary | ICD-10-CM | POA: Diagnosis not present

## 2015-02-19 DIAGNOSIS — T2020XA Burn of second degree of head, face, and neck, unspecified site, initial encounter: Secondary | ICD-10-CM | POA: Diagnosis not present

## 2015-02-19 DIAGNOSIS — T2230XA Burn of third degree of shoulder and upper limb, except wrist and hand, unspecified site, initial encounter: Secondary | ICD-10-CM | POA: Diagnosis not present

## 2015-02-19 DIAGNOSIS — Z6826 Body mass index (BMI) 26.0-26.9, adult: Secondary | ICD-10-CM | POA: Diagnosis not present

## 2015-02-19 DIAGNOSIS — E785 Hyperlipidemia, unspecified: Secondary | ICD-10-CM | POA: Diagnosis not present

## 2015-02-19 DIAGNOSIS — Z79899 Other long term (current) drug therapy: Secondary | ICD-10-CM | POA: Diagnosis not present

## 2015-02-19 DIAGNOSIS — T2230XD Burn of third degree of shoulder and upper limb, except wrist and hand, unspecified site, subsequent encounter: Secondary | ICD-10-CM | POA: Diagnosis not present

## 2015-02-19 DIAGNOSIS — Z79891 Long term (current) use of opiate analgesic: Secondary | ICD-10-CM | POA: Diagnosis not present

## 2015-02-20 DIAGNOSIS — J069 Acute upper respiratory infection, unspecified: Secondary | ICD-10-CM | POA: Diagnosis not present

## 2015-02-26 ENCOUNTER — Emergency Department (HOSPITAL_COMMUNITY)
Admission: EM | Admit: 2015-02-26 | Discharge: 2015-02-26 | Disposition: A | Discharge status: Home / Self Care | Payer: Medicare Other | Attending: Emergency Medicine | Admitting: Emergency Medicine

## 2015-02-26 ENCOUNTER — Emergency Department (HOSPITAL_COMMUNITY): Payer: Medicare Other

## 2015-02-26 ENCOUNTER — Encounter (HOSPITAL_COMMUNITY): Payer: Self-pay | Admitting: Cardiology

## 2015-02-26 DIAGNOSIS — E119 Type 2 diabetes mellitus without complications: Secondary | ICD-10-CM | POA: Insufficient documentation

## 2015-02-26 DIAGNOSIS — Z791 Long term (current) use of non-steroidal anti-inflammatories (NSAID): Secondary | ICD-10-CM | POA: Diagnosis not present

## 2015-02-26 DIAGNOSIS — M1990 Unspecified osteoarthritis, unspecified site: Secondary | ICD-10-CM | POA: Diagnosis not present

## 2015-02-26 DIAGNOSIS — R0789 Other chest pain: Secondary | ICD-10-CM | POA: Diagnosis not present

## 2015-02-26 DIAGNOSIS — Z79899 Other long term (current) drug therapy: Secondary | ICD-10-CM | POA: Diagnosis not present

## 2015-02-26 DIAGNOSIS — Z792 Long term (current) use of antibiotics: Secondary | ICD-10-CM | POA: Insufficient documentation

## 2015-02-26 DIAGNOSIS — R079 Chest pain, unspecified: Secondary | ICD-10-CM | POA: Diagnosis present

## 2015-02-26 DIAGNOSIS — Z7984 Long term (current) use of oral hypoglycemic drugs: Secondary | ICD-10-CM | POA: Insufficient documentation

## 2015-02-26 LAB — I-STAT TROPONIN, ED: Troponin i, poc: 0.01 ng/mL (ref 0.00–0.08)

## 2015-02-26 LAB — BASIC METABOLIC PANEL
Anion gap: 13 (ref 5–15)
BUN: 15 mg/dL (ref 6–20)
CO2: 28 mmol/L (ref 22–32)
Calcium: 9.9 mg/dL (ref 8.9–10.3)
Chloride: 103 mmol/L (ref 101–111)
Creatinine, Ser: 1 mg/dL (ref 0.61–1.24)
GFR calc Af Amer: >60 mL/min (ref >60)
GFR calc non Af Amer: >60 mL/min (ref >60)
Glucose, Bld: 173 mg/dL — ABNORMAL HIGH (ref 65–99)
Potassium: 4.2 mmol/L (ref 3.5–5.1)
Sodium: 144 mmol/L (ref 135–145)

## 2015-02-26 LAB — CBC
HEMATOCRIT: 40.5 % (ref 39.0–52.0)
HEMOGLOBIN: 13.5 g/dL (ref 13.0–17.0)
MCH: 31.4 pg (ref 26.0–34.0)
MCHC: 33.3 g/dL (ref 30.0–36.0)
MCV: 94.2 fL (ref 78.0–100.0)
Platelets: 187 10*3/uL (ref 150–400)
RBC: 4.3 MIL/uL (ref 4.22–5.81)
RDW: 13.1 % (ref 11.5–15.5)
WBC: 6.8 10*3/uL (ref 4.0–10.5)

## 2015-02-26 MED ORDER — FENTANYL CITRATE (PF) 100 MCG/2ML IJ SOLN
25.0000 ug | Freq: Once | INTRAMUSCULAR | Status: AC
  Administered 2015-02-26: 25 ug via INTRAVENOUS
  Filled 2015-02-26: qty 2

## 2015-02-26 MED ORDER — ACETAMINOPHEN ER 650 MG PO TBCR: 650.0000 mg | EXTENDED_RELEASE_TABLET | Freq: Three times a day (TID) | ORAL | Status: DC | PRN

## 2015-02-26 NOTE — Discharge Instructions (Signed)
As discussed, today's evaluation has been largely reassuring.  There is no evidence for ongoing heart attack, pneumonia, blood clot.  Your pain is likely due to musculoskeletal causes, like arthritis.  Please use the prescribed medication, and ice packs for relief.  Please be sure to follow-up with your physician this week to ensure improvement in your condition.  Return here for concerning changes in your condition.
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
## 2015-02-26 NOTE — ED Notes (Signed)
Pt reports chest pain intermittently for the past 3 months. States the pain became worse last night. Reports some SOB, and left arm numbness with the pain.

## 2015-03-19 ENCOUNTER — Ambulatory Visit (INDEPENDENT_AMBULATORY_CARE_PROVIDER_SITE_OTHER): Payer: Medicare Other | Admitting: Family Medicine

## 2015-03-19 ENCOUNTER — Encounter: Payer: Self-pay | Admitting: Family Medicine

## 2015-03-19 VITALS — BP 122/68 | HR 61 | Temp 98.6°F | Resp 17 | Ht 67.0 in | Wt 167.3 lb

## 2015-03-19 DIAGNOSIS — K219 Gastro-esophageal reflux disease without esophagitis: Secondary | ICD-10-CM | POA: Diagnosis not present

## 2015-03-19 DIAGNOSIS — E119 Type 2 diabetes mellitus without complications: Secondary | ICD-10-CM

## 2015-03-19 DIAGNOSIS — E785 Hyperlipidemia, unspecified: Secondary | ICD-10-CM

## 2015-03-19 DIAGNOSIS — E1169 Type 2 diabetes mellitus with other specified complication: Secondary | ICD-10-CM | POA: Diagnosis not present

## 2015-03-19 LAB — POCT GLYCOSYLATED HEMOGLOBIN (HGB A1C): HEMOGLOBIN A1C: 7

## 2015-03-19 MED ORDER — METFORMIN HCL 500 MG PO TABS: 500.0000 mg | ORAL_TABLET | Freq: Two times a day (BID) | ORAL | Status: DC

## 2015-03-19 MED ORDER — ATORVASTATIN CALCIUM 10 MG PO TABS: 10.0000 mg | ORAL_TABLET | Freq: Every day | ORAL | Status: DC

## 2015-03-19 MED ORDER — GLUCOSE BLOOD VI STRP: ORAL_STRIP | Status: DC

## 2015-03-19 MED ORDER — OMEPRAZOLE 20 MG PO CPDR: 20.0000 mg | DELAYED_RELEASE_CAPSULE | Freq: Every day | ORAL | Status: DC

## 2015-03-19 NOTE — Progress Notes (Signed)
Name: Billy Franco   MRN: [id number]    DOB: August 11, 1940   Date:03/19/2015       Progress Note  Subjective  Chief Complaint  Chief Complaint  Patient presents with  . Follow-up    3 mo / Discuss stepdown reflux therapy  . Diabetes  . Hyperlipidemia  . Gastroesophageal Reflux    Diabetes He has type 2 diabetes mellitus. His disease course has been stable. There are no hypoglycemic associated symptoms. Pertinent negatives for diabetes include no chest pain, no polydipsia and no polyuria. Symptoms are stable. Current diabetic treatment includes oral agent (monotherapy). His breakfast blood glucose range is generally 90-110 mg/dl. An ACE inhibitor/angiotensin II receptor blocker is not being taken. Eye exam is current.  Hyperlipidemia This is a chronic problem. The problem is controlled. Recent lipid tests were reviewed and are normal. Pertinent negatives include no chest pain, leg pain, myalgias or shortness of breath. Current antihyperlipidemic treatment includes statins.  Gastroesophageal Reflux He reports no abdominal pain, no chest pain, no dysphagia, no heartburn or no sore throat. This is a chronic problem. He has tried a PPI for the symptoms.    Past Medical History  Diagnosis Date  . Diabetes mellitus without complication ([place])   . Arthritis     Past Surgical History  Procedure Laterality Date  . Nasal fracture surgery  1994  . Ureterolithotomy Right 2015    Family History  Problem Relation Age of Onset  . Heart disease Father     Social History   Social History  . Marital Status: Married    Spouse Name: N/A  . Number of Children: N/A  . Years of Education: N/A   Occupational History  . Not on file.   Social History Main Topics  . Smoking status: Never Smoker   . Smokeless tobacco: Never Used  . Alcohol Use: No  . Drug Use: No  . Sexual Activity: No   Other Topics Concern  . Not on file   Social History Narrative     Current outpatient  prescriptions:  .  acetaminophen (TYLENOL) 650 MG CR tablet, Take 1 tablet (650 mg total) by mouth every 8 (eight) hours as needed for pain., Disp: 30 tablet, Rfl: 0 .  atorvastatin (LIPITOR) 10 MG tablet, Take 1 tablet (10 mg total) by mouth daily at 6 PM., Disp: 90 tablet, Rfl: 0 .  B-D ULTRA-FINE 33 LANCETS MISC, , Disp: , Rfl:  .  BLOOD GLUCOSE MONITORING SUPPL, ONETOUCH ULTRA (Device) - Historical Medication Active, Disp: , Rfl:  .  docusate sodium (COLACE) 50 MG capsule, Take 50 mg by mouth daily as needed for mild constipation. , Disp: , Rfl:  .  fluticasone (FLONASE) 50 MCG/ACT nasal spray, Place 1 spray into both nostrils 2 (two) times daily., Disp: , Rfl:  .  glucose blood (ONE TOUCH ULTRA TEST) test strip, , Disp: , Rfl:  .  metFORMIN (GLUCOPHAGE) 500 MG tablet, Take 1 tablet (500 mg total) by mouth 2 (two) times daily with a meal., Disp: 180 tablet, Rfl: 0 .  MULTIPLE VITAMIN PO, Take by mouth., Disp: , Rfl:  .  Nutritional Supplements (THERALITH XR) TABS, Take 2 tablets by mouth daily. , Disp: , Rfl:  .  omeprazole (PRILOSEC) 20 MG capsule, Take 1 capsule (20 mg total) by mouth daily., Disp: 90 capsule, Rfl: 0 .  ONETOUCH DELICA LANCETS [id number] MISC, , Disp: , Rfl:  .  tamsulosin (FLOMAX) 0.4 MG CAPS capsule, Take 1 capsule by mouth  daily., Disp: , Rfl:  .  tapentadol (NUCYNTA) 50 MG TABS tablet, Take 1 tablet by mouth as needed., Disp: , Rfl:   Allergies  Allergen Reactions  . Codeine Rash and Other (See Comments)    Per pt "hard on my kidneys"  Per pt "hard on my kidneys"  Per pt "hard on my kidneys"   . Lovastatin Rash and Other (See Comments)     Review of Systems  HENT: Negative for sore throat.   Respiratory: Negative for shortness of breath.   Cardiovascular: Negative for chest pain.  Gastrointestinal: Negative for heartburn, dysphagia and abdominal pain.  Musculoskeletal: Negative for myalgias.  Endo/Heme/Allergies: Negative for polydipsia.    Objective  Filed  Vitals:   03/19/15 0838  BP: 122/68  Pulse: 61  Temp: 98.6 F (37 C)  TempSrc: Oral  Resp: 17  Height: 5\' 7"  (1.702 m)  Weight: 167 lb 4.8 oz (75.887 kg)  SpO2: 98%    Physical Exam  Constitutional: He is oriented to person, place, and time and well-developed, well-nourished, and in no distress.  HENT:  Head: Normocephalic and atraumatic.  Cardiovascular: Normal rate and regular rhythm.   Pulmonary/Chest: Effort normal and breath sounds normal.  Abdominal: Soft. Bowel sounds are normal.  Neurological: He is alert and oriented to person, place, and time.  Nursing note and vitals reviewed.    Assessment & Plan  1. Controlled type 2 diabetes mellitus without complication, without long-term current use of insulin (HCC)  - metFORMIN (GLUCOPHAGE) 500 MG tablet; Take 1 tablet (500 mg total) by mouth 2 (two) times daily with a meal.  Dispense: 180 tablet; Refill: 0 - POCT HgB A1C - POCT Glucose (CBG) - Urine Microalbumin w/creat. ratio - glucose blood (ONE TOUCH ULTRA TEST) test strip; Use as directed to check BG daily.  Dispense: 100 each; Refill: 0  2. Dyslipidemia associated with type 2 diabetes mellitus (HCC)  - atorvastatin (LIPITOR) 10 MG tablet; Take 1 tablet (10 mg total) by mouth daily at 6 PM.  Dispense: 90 tablet; Refill: 0 - Lipid Profile  3. Gastroesophageal reflux disease, esophagitis presence not specified Discussed the potential for kidney damage with long-term PPI use. Patient's last kidney function was reassuring. Will follow-up - omeprazole (PRILOSEC) 20 MG capsule; Take 1 capsule (20 mg total) by mouth daily.  Dispense: 90 capsule; Refill: 0   Cobin Cadavid Asad [facility] 03/19/2015 8:49 AM

## 2015-03-20 LAB — LIPID PANEL
Chol/HDL Ratio: 2.3 ratio units (ref 0.0–5.0)
Cholesterol, Total: 115 mg/dL (ref 100–199)
HDL: 51 mg/dL (ref >39)
LDL CALC: 44 mg/dL (ref 0–99)
Triglycerides: 99 mg/dL (ref 0–149)
VLDL CHOLESTEROL CAL: 20 mg/dL (ref 5–40)

## 2015-03-20 LAB — MICROALBUMIN / CREATININE URINE RATIO
CREATININE, UR: 89.8 mg/dL
MICROALB/CREAT RATIO: 20.9 mg/g creat (ref 0.0–30.0)
Microalbumin, Urine: 18.8 ug/mL

## 2015-04-16 DIAGNOSIS — T2230XA Burn of third degree of shoulder and upper limb, except wrist and hand, unspecified site, initial encounter: Secondary | ICD-10-CM | POA: Diagnosis not present

## 2015-04-16 DIAGNOSIS — T2230XS Burn of third degree of shoulder and upper limb, except wrist and hand, unspecified site, sequela: Secondary | ICD-10-CM | POA: Diagnosis not present

## 2015-04-16 DIAGNOSIS — L905 Scar conditions and fibrosis of skin: Secondary | ICD-10-CM | POA: Diagnosis not present

## 2015-04-16 DIAGNOSIS — Z6826 Body mass index (BMI) 26.0-26.9, adult: Secondary | ICD-10-CM | POA: Diagnosis not present

## 2015-04-16 DIAGNOSIS — T310 Burns involving less than 10% of body surface: Secondary | ICD-10-CM | POA: Diagnosis not present

## 2015-04-16 DIAGNOSIS — T2020XS Burn of second degree of head, face, and neck, unspecified site, sequela: Secondary | ICD-10-CM | POA: Diagnosis not present

## 2015-04-17 ENCOUNTER — Ambulatory Visit
Admission: RE | Admit: 2015-04-17 | Discharge: 2015-04-17 | Disposition: A | Discharge status: Home / Self Care | Payer: Medicare Other | Source: Ambulatory Visit | Attending: Family Medicine | Admitting: Family Medicine

## 2015-04-17 ENCOUNTER — Other Ambulatory Visit: Payer: Self-pay | Admitting: Family Medicine

## 2015-04-17 DIAGNOSIS — M79661 Pain in right lower leg: Secondary | ICD-10-CM

## 2015-04-17 DIAGNOSIS — M25561 Pain in right knee: Secondary | ICD-10-CM | POA: Diagnosis not present

## 2015-04-17 DIAGNOSIS — M7989 Other specified soft tissue disorders: Secondary | ICD-10-CM | POA: Diagnosis not present

## 2015-05-01 DIAGNOSIS — M25561 Pain in right knee: Secondary | ICD-10-CM | POA: Diagnosis not present

## 2015-05-01 DIAGNOSIS — M2391 Unspecified internal derangement of right knee: Secondary | ICD-10-CM | POA: Diagnosis not present

## 2015-05-14 DIAGNOSIS — T2020XS Burn of second degree of head, face, and neck, unspecified site, sequela: Secondary | ICD-10-CM | POA: Diagnosis not present

## 2015-05-14 DIAGNOSIS — T2230XS Burn of third degree of shoulder and upper limb, except wrist and hand, unspecified site, sequela: Secondary | ICD-10-CM | POA: Diagnosis not present

## 2015-05-14 DIAGNOSIS — N401 Enlarged prostate with lower urinary tract symptoms: Secondary | ICD-10-CM | POA: Diagnosis not present

## 2015-05-14 DIAGNOSIS — N5201 Erectile dysfunction due to arterial insufficiency: Secondary | ICD-10-CM | POA: Diagnosis not present

## 2015-05-14 DIAGNOSIS — I998 Other disorder of circulatory system: Secondary | ICD-10-CM | POA: Diagnosis not present

## 2015-05-14 DIAGNOSIS — L905 Scar conditions and fibrosis of skin: Secondary | ICD-10-CM | POA: Diagnosis not present

## 2015-05-14 DIAGNOSIS — R351 Nocturia: Secondary | ICD-10-CM | POA: Diagnosis not present

## 2015-06-25 ENCOUNTER — Ambulatory Visit (INDEPENDENT_AMBULATORY_CARE_PROVIDER_SITE_OTHER): Payer: Medicare Other | Admitting: Family Medicine

## 2015-06-25 ENCOUNTER — Encounter: Payer: Self-pay | Admitting: Family Medicine

## 2015-06-25 VITALS — BP 122/70 | HR 62 | Temp 97.8°F | Resp 16 | Ht 67.0 in | Wt 164.2 lb

## 2015-06-25 DIAGNOSIS — E1169 Type 2 diabetes mellitus with other specified complication: Secondary | ICD-10-CM

## 2015-06-25 DIAGNOSIS — E785 Hyperlipidemia, unspecified: Secondary | ICD-10-CM | POA: Diagnosis not present

## 2015-06-25 DIAGNOSIS — L905 Scar conditions and fibrosis of skin: Secondary | ICD-10-CM | POA: Diagnosis not present

## 2015-06-25 DIAGNOSIS — I998 Other disorder of circulatory system: Secondary | ICD-10-CM | POA: Diagnosis not present

## 2015-06-25 DIAGNOSIS — E119 Type 2 diabetes mellitus without complications: Secondary | ICD-10-CM | POA: Diagnosis not present

## 2015-06-25 DIAGNOSIS — K219 Gastro-esophageal reflux disease without esophagitis: Secondary | ICD-10-CM | POA: Diagnosis not present

## 2015-06-25 LAB — GLUCOSE, POCT (MANUAL RESULT ENTRY): POC Glucose: 153 mg/dl — AB (ref 70–99)

## 2015-06-25 LAB — POCT GLYCOSYLATED HEMOGLOBIN (HGB A1C): Hemoglobin A1C: 6.6

## 2015-06-25 MED ORDER — GLUCOSE BLOOD VI STRP: ORAL_STRIP | Status: DC

## 2015-06-25 MED ORDER — ATORVASTATIN CALCIUM 10 MG PO TABS: 10.0000 mg | ORAL_TABLET | Freq: Every day | ORAL | Status: DC

## 2015-06-25 MED ORDER — METFORMIN HCL 500 MG PO TABS: 500.0000 mg | ORAL_TABLET | Freq: Two times a day (BID) | ORAL | Status: DC

## 2015-06-25 MED ORDER — OMEPRAZOLE 20 MG PO CPDR: 20.0000 mg | DELAYED_RELEASE_CAPSULE | Freq: Every day | ORAL | Status: DC

## 2015-06-25 NOTE — Progress Notes (Signed)
Name: Billy Franco   MRN: [id number]    DOB: 1940-08-12   Date:06/25/2015       Progress Note  Subjective  Chief Complaint  Chief Complaint  Patient presents with  . Hypertension    medication refills  . Hyperlipidemia  . Diabetes    blood sugar 117 to 133 at home    Hyperlipidemia This is a chronic problem. The problem is controlled. Recent lipid tests were reviewed and are normal. Pertinent negatives include no chest pain, leg pain or myalgias (have leg cramps occasionally but leg cramps were present before started on Lipitor.). Current antihyperlipidemic treatment includes statins. The current treatment provides significant improvement of lipids. There are no compliance problems.   Diabetes He presents for his follow-up diabetic visit. He has type 2 diabetes mellitus. His disease course has been stable. Pertinent negatives for diabetes include no chest pain, no fatigue, no polydipsia and no polyuria. Current diabetic treatment includes oral agent (monotherapy). His breakfast blood glucose range is generally 110-130 mg/dl. An ACE inhibitor/angiotensin II receptor blocker is not being taken. Eye exam is current.  Gastroesophageal Reflux He reports no abdominal pain, no chest pain, no choking, no dysphagia or no heartburn. This is a chronic problem. The problem has been unchanged. Pertinent negatives include no fatigue. He has tried a PPI for the symptoms. The treatment provided significant relief.     Past Medical History  Diagnosis Date  . Diabetes mellitus without complication ([place])   . Arthritis     Past Surgical History  Procedure Laterality Date  . Nasal fracture surgery  1994  . Ureterolithotomy Right 2015    Family History  Problem Relation Age of Onset  . Heart disease Father     Social History   Social History  . Marital Status: Married    Spouse Name: N/A  . Number of Children: N/A  . Years of Education: N/A   Occupational History  . Not on file.    Social History Main Topics  . Smoking status: Never Smoker   . Smokeless tobacco: Never Used  . Alcohol Use: No  . Drug Use: No  . Sexual Activity: No   Other Topics Concern  . Not on file   Social History Narrative     Current outpatient prescriptions:  .  acetaminophen (TYLENOL) 650 MG CR tablet, Take 1 tablet (650 mg total) by mouth every 8 (eight) hours as needed for pain., Disp: 30 tablet, Rfl: 0 .  atorvastatin (LIPITOR) 10 MG tablet, Take 1 tablet (10 mg total) by mouth daily at 6 PM., Disp: 90 tablet, Rfl: 0 .  B-D ULTRA-FINE 33 LANCETS MISC, , Disp: , Rfl:  .  BLOOD GLUCOSE MONITORING SUPPL, ONETOUCH ULTRA (Device) - Historical Medication Active, Disp: , Rfl:  .  docusate sodium (COLACE) 50 MG capsule, Take 50 mg by mouth daily as needed for mild constipation. , Disp: , Rfl:  .  fluticasone (FLONASE) 50 MCG/ACT nasal spray, Place 1 spray into both nostrils 2 (two) times daily., Disp: , Rfl:  .  glucose blood (ONE TOUCH ULTRA TEST) test strip, Use as directed to check BG daily., Disp: 100 each, Rfl: 0 .  metFORMIN (GLUCOPHAGE) 500 MG tablet, Take 1 tablet (500 mg total) by mouth 2 (two) times daily with a meal., Disp: 180 tablet, Rfl: 0 .  MULTIPLE VITAMIN PO, Take by mouth., Disp: , Rfl:  .  Nutritional Supplements (THERALITH XR) TABS, Take 2 tablets by mouth daily. , Disp: , Rfl:  .  omeprazole (PRILOSEC) 20 MG capsule, Take 1 capsule (20 mg total) by mouth daily., Disp: 90 capsule, Rfl: 0 .  ONETOUCH DELICA LANCETS 99991111 MISC, , Disp: , Rfl:  .  tamsulosin (FLOMAX) 0.4 MG CAPS capsule, Take 1 capsule by mouth daily., Disp: , Rfl:  .  tapentadol (NUCYNTA) 50 MG TABS tablet, Take 1 tablet by mouth as needed., Disp: , Rfl:   Allergies  Allergen Reactions  . Codeine Rash and Other (See Comments)    Per pt "hard on my kidneys"  Per pt "hard on my kidneys"  Per pt "hard on my kidneys"   . Lovastatin Rash and Other (See Comments)    Review of Systems  Constitutional:  Negative for fatigue.  Respiratory: Negative for choking.   Cardiovascular: Negative for chest pain.  Gastrointestinal: Negative for heartburn, dysphagia and abdominal pain.  Musculoskeletal: Negative for myalgias (have leg cramps occasionally but leg cramps were present before started on Lipitor.).  Endo/Heme/Allergies: Negative for polydipsia.    Objective  Filed Vitals:   06/25/15 0842  BP: 122/70  Pulse: 62  Temp: 97.8 F (36.6 C)  TempSrc: Oral  Resp: 16  Height: 5\' 7"  (1.702 m)  Weight: 164 lb 3.2 oz (74.481 kg)  SpO2: 94%    Physical Exam  Constitutional: He is well-developed, well-nourished, and in no distress.  Cardiovascular: Normal rate, regular rhythm, S1 normal and S2 normal.   No murmur heard. Pulmonary/Chest: Breath sounds normal. No respiratory distress. He has no decreased breath sounds. He has no wheezes.  Abdominal: Soft. Bowel sounds are normal. There is no tenderness.  Nursing note and vitals reviewed.      Assessment & Plan  1. Controlled type 2 diabetes mellitus without complication, without long-term current use of insulin (HCC) A1c is at goal at 6.6%, refill for metformin provided. - glucose blood (ONE TOUCH ULTRA TEST) test strip; Use as directed to check BG daily.  Dispense: 100 each; Refill: 0 - metFORMIN (GLUCOPHAGE) 500 MG tablet; Take 1 tablet (500 mg total) by mouth 2 (two) times daily with a meal.  Dispense: 180 tablet; Refill: 0 - POCT HgB A1C - POCT Glucose (CBG)  2. Dyslipidemia associated with type 2 diabetes mellitus ([place]) FLP from February 2017 reviewed and is at goal. Refills for Lipitor provided - atorvastatin (LIPITOR) 10 MG tablet; Take 1 tablet (10 mg total) by mouth daily at 6 PM.  Dispense: 90 tablet; Refill: 0  3. Gastroesophageal reflux disease, esophagitis presence not specified  - omeprazole (PRILOSEC) 20 MG capsule; Take 1 capsule (20 mg total) by mouth daily.  Dispense: 90 capsule; Refill: 0   Kavi Almquist Asad A.  [facility] 06/25/2015 8:53 AM

## 2015-10-01 ENCOUNTER — Ambulatory Visit (INDEPENDENT_AMBULATORY_CARE_PROVIDER_SITE_OTHER): Payer: Medicare Other | Admitting: Family Medicine

## 2015-10-01 ENCOUNTER — Encounter: Payer: Self-pay | Admitting: Family Medicine

## 2015-10-01 VITALS — BP 118/70 | HR 68 | Temp 98.3°F | Resp 16 | Ht 67.0 in | Wt 165.0 lb

## 2015-10-01 DIAGNOSIS — E785 Hyperlipidemia, unspecified: Secondary | ICD-10-CM | POA: Diagnosis not present

## 2015-10-01 DIAGNOSIS — E119 Type 2 diabetes mellitus without complications: Secondary | ICD-10-CM | POA: Diagnosis not present

## 2015-10-01 DIAGNOSIS — E1169 Type 2 diabetes mellitus with other specified complication: Secondary | ICD-10-CM | POA: Diagnosis not present

## 2015-10-01 DIAGNOSIS — K219 Gastro-esophageal reflux disease without esophagitis: Secondary | ICD-10-CM

## 2015-10-01 LAB — LIPID PANEL
CHOL/HDL RATIO: 2 ratio (ref <=5.0)
CHOLESTEROL: 111 mg/dL — AB (ref 125–200)
HDL: 55 mg/dL (ref >=40)
LDL Cholesterol: 40 mg/dL (ref <130)
TRIGLYCERIDES: 80 mg/dL (ref <150)
VLDL: 16 mg/dL (ref <30)

## 2015-10-01 LAB — COMPLETE METABOLIC PANEL WITH GFR
ALBUMIN: 4.4 g/dL (ref 3.6–5.1)
ALK PHOS: 85 U/L (ref 40–115)
ALT: 22 U/L (ref 9–46)
AST: 25 U/L (ref 10–35)
BILIRUBIN TOTAL: 0.9 mg/dL (ref 0.2–1.2)
BUN: 20 mg/dL (ref 7–25)
CALCIUM: 9.8 mg/dL (ref 8.6–10.3)
CO2: 29 mmol/L (ref 20–31)
Chloride: 102 mmol/L (ref 98–110)
Creat: 0.9 mg/dL (ref 0.70–1.18)
GFR, EST NON AFRICAN AMERICAN: 83 mL/min (ref >=60)
Glucose, Bld: 144 mg/dL — ABNORMAL HIGH (ref 65–99)
POTASSIUM: 5 mmol/L (ref 3.5–5.3)
Sodium: 139 mmol/L (ref 135–146)
Total Protein: 7.2 g/dL (ref 6.1–8.1)

## 2015-10-01 LAB — POCT GLYCOSYLATED HEMOGLOBIN (HGB A1C): Hemoglobin A1C: 6.6

## 2015-10-01 LAB — GLUCOSE, POCT (MANUAL RESULT ENTRY): POC GLUCOSE: 122 mg/dL — AB (ref 70–99)

## 2015-10-01 MED ORDER — GLUCOSE BLOOD VI STRP: ORAL_STRIP | 0 refills | Status: DC

## 2015-10-01 MED ORDER — OMEPRAZOLE 20 MG PO CPDR: 20.0000 mg | DELAYED_RELEASE_CAPSULE | Freq: Every day | ORAL | 0 refills | Status: DC

## 2015-10-01 MED ORDER — METFORMIN HCL 500 MG PO TABS: 500.0000 mg | ORAL_TABLET | Freq: Two times a day (BID) | ORAL | 0 refills | Status: DC

## 2015-10-01 MED ORDER — ATORVASTATIN CALCIUM 10 MG PO TABS: 10.0000 mg | ORAL_TABLET | Freq: Every day | ORAL | 0 refills | Status: DC

## 2015-10-01 NOTE — Progress Notes (Signed)
Name: Billy Franco   MRN: [id number]    DOB: Jan 13, 1941   Date:10/01/2015       Progress Note  Subjective  Chief Complaint  Chief Complaint  Patient presents with  . Diabetes    3 month follow up, medication refills  . Hypertension  . Hyperlipidemia    Diabetes  He presents for his follow-up diabetic visit. He has type 2 diabetes mellitus. His disease course has been stable. Pertinent negatives for diabetes include no fatigue, no polydipsia and no polyuria. Current diabetic treatment includes oral agent (monotherapy). His breakfast blood glucose range is generally 110-130 mg/dl. An ACE inhibitor/angiotensin II receptor blocker is not being taken. Eye exam is current.  Hyperlipidemia  This is a chronic problem. The problem is controlled. Recent lipid tests were reviewed and are normal. Pertinent negatives include no leg pain or myalgias. Current antihyperlipidemic treatment includes statins. The current treatment provides significant improvement of lipids. There are no compliance problems.   Gastroesophageal Reflux  He reports no abdominal pain, no choking, no dysphagia or no heartburn. This is a chronic problem. The problem has been unchanged. Pertinent negatives include no fatigue. He has tried a PPI for the symptoms. The treatment provided significant relief.     Past Medical History:  Diagnosis Date  . Arthritis   . Diabetes mellitus without complication [facility])     Past Surgical History:  Procedure Laterality Date  . NASAL FRACTURE SURGERY  1994  . URETEROLITHOTOMY Right 2015    Family History  Problem Relation Age of Onset  . Heart disease Father     Social History   Social History  . Marital status: Married    Spouse name: N/A  . Number of children: N/A  . Years of education: N/A   Occupational History  . Not on file.   Social History Main Topics  . Smoking status: Never Smoker  . Smokeless tobacco: Never Used  . Alcohol use No  . Drug use: No  . Sexual  activity: No   Other Topics Concern  . Not on file   Social History Narrative  . No narrative on file     Current Outpatient Prescriptions:  .  acetaminophen (TYLENOL) 650 MG CR tablet, Take 1 tablet (650 mg total) by mouth every 8 (eight) hours as needed for pain., Disp: 30 tablet, Rfl: 0 .  atorvastatin (LIPITOR) 10 MG tablet, Take 1 tablet (10 mg total) by mouth daily at 6 PM., Disp: 90 tablet, Rfl: 0 .  B-D ULTRA-FINE 33 LANCETS MISC, , Disp: , Rfl:  .  BLOOD GLUCOSE MONITORING SUPPL, ONETOUCH ULTRA (Device) - Historical Medication Active, Disp: , Rfl:  .  docusate sodium (COLACE) 50 MG capsule, Take 50 mg by mouth daily as needed for mild constipation. , Disp: , Rfl:  .  fluticasone (FLONASE) 50 MCG/ACT nasal spray, Place 1 spray into both nostrils 2 (two) times daily., Disp: , Rfl:  .  glucose blood (ONE TOUCH ULTRA TEST) test strip, Use as directed to check BG daily., Disp: 100 each, Rfl: 0 .  metFORMIN (GLUCOPHAGE) 500 MG tablet, Take 1 tablet (500 mg total) by mouth 2 (two) times daily with a meal., Disp: 180 tablet, Rfl: 0 .  MULTIPLE VITAMIN PO, Take by mouth., Disp: , Rfl:  .  Nutritional Supplements (THERALITH XR) TABS, Take 2 tablets by mouth daily. , Disp: , Rfl:  .  omeprazole (PRILOSEC) 20 MG capsule, Take 1 capsule (20 mg total) by mouth daily., Disp: 90  capsule, Rfl: 0 .  ONETOUCH DELICA LANCETS 99991111 MISC, , Disp: , Rfl:  .  tamsulosin (FLOMAX) 0.4 MG CAPS capsule, Take 1 capsule by mouth daily., Disp: , Rfl:  .  tapentadol (NUCYNTA) 50 MG TABS tablet, Take 1 tablet by mouth as needed., Disp: , Rfl:   Allergies  Allergen Reactions  . Codeine Rash and Other (See Comments)    Per pt "hard on my kidneys"  Per pt "hard on my kidneys"  Per pt "hard on my kidneys"   . Lovastatin Rash and Other (See Comments)     Review of Systems  Constitutional: Negative for fatigue.  Respiratory: Negative for choking.   Gastrointestinal: Negative for abdominal pain, dysphagia and  heartburn.  Musculoskeletal: Negative for myalgias.  Endo/Heme/Allergies: Negative for polydipsia.    Objective  Vitals:   10/01/15 0818  BP: 118/70  Pulse: 68  Resp: 16  Temp: 98.3 F (36.8 C)  TempSrc: Oral  SpO2: 96%  Weight: 165 lb (74.8 kg)  Height: 5\' 7"  (1.702 m)    Physical Exam  Constitutional: He is well-developed, well-nourished, and in no distress.  Cardiovascular: Normal rate, regular rhythm, S1 normal, S2 normal and normal heart sounds.   No murmur heard. Pulmonary/Chest: Breath sounds normal. No respiratory distress. He has no decreased breath sounds. He has no wheezes.  Abdominal: Soft. Bowel sounds are normal. There is no tenderness.  Musculoskeletal:       Right ankle: He exhibits no swelling.       Left ankle: He exhibits no swelling.  Psychiatric: Mood, memory, affect and judgment normal.  Nursing note and vitals reviewed.    Recent Results (from the past 2160 hour(s))  POCT HgB A1C     Status: None   Collection Time: 10/01/15  8:24 AM  Result Value Ref Range   Hemoglobin A1C 6.6   POCT Glucose (CBG)     Status: Abnormal   Collection Time: 10/01/15  8:24 AM  Result Value Ref Range   POC Glucose 122 (A) 70 - 99 mg/dl     Assessment & Plan 1. Controlled type 2 diabetes mellitus without complication, without long-term current use of insulin (HCC)  - POCT HgB A1C - POCT Glucose (CBG) - glucose blood (ONE TOUCH ULTRA TEST) test strip; Use as directed to check BG daily.  Dispense: 100 each; Refill: 0 - metFORMIN (GLUCOPHAGE) 500 MG tablet; Take 1 tablet (500 mg total) by mouth 2 (two) times daily with a meal.  Dispense: 180 tablet; Refill: 0  2. Dyslipidemia associated with type 2 diabetes mellitus (HCC)  - atorvastatin (LIPITOR) 10 MG tablet; Take 1 tablet (10 mg total) by mouth daily at 6 PM.  Dispense: 90 tablet; Refill: 0 - Lipid Profile - COMPLETE METABOLIC PANEL WITH GFR  3. Gastroesophageal reflux disease, esophagitis presence not  specified  - omeprazole (PRILOSEC) 20 MG capsule; Take 1 capsule (20 mg total) by mouth daily.  Dispense: 90 capsule; Refill: 0   Joellyn Grandt Asad A. [facility] 10/01/2015 8:40 AM

## 2015-12-31 ENCOUNTER — Ambulatory Visit (INDEPENDENT_AMBULATORY_CARE_PROVIDER_SITE_OTHER): Payer: Medicare Other | Admitting: Family Medicine

## 2015-12-31 ENCOUNTER — Encounter: Payer: Self-pay | Admitting: Family Medicine

## 2015-12-31 VITALS — BP 122/76 | HR 76 | Temp 97.9°F | Resp 16 | Ht 67.0 in | Wt 167.0 lb

## 2015-12-31 DIAGNOSIS — E785 Hyperlipidemia, unspecified: Secondary | ICD-10-CM | POA: Diagnosis not present

## 2015-12-31 DIAGNOSIS — E119 Type 2 diabetes mellitus without complications: Secondary | ICD-10-CM

## 2015-12-31 DIAGNOSIS — K219 Gastro-esophageal reflux disease without esophagitis: Secondary | ICD-10-CM

## 2015-12-31 DIAGNOSIS — E1169 Type 2 diabetes mellitus with other specified complication: Secondary | ICD-10-CM

## 2015-12-31 MED ORDER — OMEPRAZOLE 20 MG PO CPDR: 20.0000 mg | DELAYED_RELEASE_CAPSULE | Freq: Every day | ORAL | 0 refills | Status: DC

## 2015-12-31 MED ORDER — ATORVASTATIN CALCIUM 10 MG PO TABS: 10.0000 mg | ORAL_TABLET | Freq: Every day | ORAL | 0 refills | Status: DC

## 2015-12-31 MED ORDER — METFORMIN HCL 500 MG PO TABS: 500.0000 mg | ORAL_TABLET | Freq: Two times a day (BID) | ORAL | 0 refills | Status: DC

## 2015-12-31 NOTE — Progress Notes (Signed)
Name: Billy Franco   MRN: [id number]    DOB: [birth year]   Date:12/31/2015       Progress Note  Subjective  Chief Complaint  Chief Complaint  Patient presents with  . Diabetes    follow up BS at home 135   . Follow-up    medication refills    Diabetes  He presents for his follow-up diabetic visit. He has type 2 diabetes mellitus. His disease course has been stable. Pertinent negatives for diabetes include no fatigue, no polydipsia and no polyuria. Pertinent negatives for diabetic complications include no CVA, heart disease or nephropathy. Current diabetic treatment includes oral agent (monotherapy). He is following a diabetic diet. His breakfast blood glucose range is generally 110-130 mg/dl. An ACE inhibitor/angiotensin II receptor blocker is not being taken. Eye exam is current.  Hyperlipidemia  This is a chronic problem. The problem is controlled. Recent lipid tests were reviewed and are normal. Pertinent negatives include no leg pain or myalgias. Current antihyperlipidemic treatment includes statins. The current treatment provides significant improvement of lipids. There are no compliance problems.   Gastroesophageal Reflux  He reports no abdominal pain, no choking, no dysphagia or no heartburn. This is a chronic problem. The problem has been unchanged. Pertinent negatives include no fatigue. He has tried a PPI for the symptoms. The treatment provided significant relief.     Past Medical History:  Diagnosis Date  . Arthritis   . Diabetes mellitus without complication [facility])     Past Surgical History:  Procedure Laterality Date  . NASAL FRACTURE SURGERY  1994  . URETEROLITHOTOMY Right 2015    Family History  Problem Relation Age of Onset  . Heart disease Father     Social History   Social History  . Marital status: Married    Spouse name: N/A  . Number of children: N/A  . Years of education: N/A   Occupational History  . Not on file.   Social History Main Topics   . Smoking status: Never Smoker  . Smokeless tobacco: Never Used  . Alcohol use No  . Drug use: No  . Sexual activity: No   Other Topics Concern  . Not on file   Social History Narrative  . No narrative on file     Current Outpatient Prescriptions:  .  acetaminophen (TYLENOL) 650 MG CR tablet, Take 1 tablet (650 mg total) by mouth every 8 (eight) hours as needed for pain., Disp: 30 tablet, Rfl: 0 .  atorvastatin (LIPITOR) 10 MG tablet, Take 1 tablet (10 mg total) by mouth daily at 6 PM., Disp: 90 tablet, Rfl: 0 .  B-D ULTRA-FINE 33 LANCETS MISC, , Disp: , Rfl:  .  BLOOD GLUCOSE MONITORING SUPPL, ONETOUCH ULTRA (Device) - Historical Medication Active, Disp: , Rfl:  .  docusate sodium (COLACE) 50 MG capsule, Take 50 mg by mouth daily as needed for mild constipation. , Disp: , Rfl:  .  fluticasone (FLONASE) 50 MCG/ACT nasal spray, Place 1 spray into both nostrils 2 (two) times daily., Disp: , Rfl:  .  glucose blood (ONE TOUCH ULTRA TEST) test strip, Use as directed to check BG daily., Disp: 100 each, Rfl: 0 .  metFORMIN (GLUCOPHAGE) 500 MG tablet, Take 1 tablet (500 mg total) by mouth 2 (two) times daily with a meal., Disp: 180 tablet, Rfl: 0 .  MULTIPLE VITAMIN PO, Take by mouth., Disp: , Rfl:  .  Nutritional Supplements (THERALITH XR) TABS, Take 2 tablets by mouth daily. , Disp: ,  Rfl:  .  omeprazole (PRILOSEC) 20 MG capsule, Take 1 capsule (20 mg total) by mouth daily., Disp: 90 capsule, Rfl: 0 .  ONETOUCH DELICA LANCETS 99991111 MISC, , Disp: , Rfl:  .  tamsulosin (FLOMAX) 0.4 MG CAPS capsule, Take 1 capsule by mouth daily., Disp: , Rfl:  .  tapentadol (NUCYNTA) 50 MG TABS tablet, Take 1 tablet by mouth as needed., Disp: , Rfl:   Allergies  Allergen Reactions  . Codeine Rash and Other (See Comments)    Per pt "hard on my kidneys"  Per pt "hard on my kidneys"  Per pt "hard on my kidneys"   . Lovastatin Rash and Other (See Comments)     Review of Systems  Constitutional: Negative  for fatigue.  Respiratory: Negative for choking.   Gastrointestinal: Negative for abdominal pain, dysphagia and heartburn.  Musculoskeletal: Negative for myalgias.  Endo/Heme/Allergies: Negative for polydipsia.    Objective  Vitals:   12/31/15 0831  BP: 122/76  Pulse: 76  Resp: 16  Temp: 97.9 F (36.6 C)  TempSrc: Oral  SpO2: 94%  Weight: 167 lb (75.8 kg)  Height: 5\' 7"  (1.702 m)    Physical Exam  Constitutional: He is well-developed, well-nourished, and in no distress.  Cardiovascular: Normal rate, regular rhythm, S1 normal, S2 normal and normal heart sounds.   No murmur heard. Pulmonary/Chest: Breath sounds normal. No respiratory distress. He has no decreased breath sounds. He has no wheezes.  Abdominal: Soft. Bowel sounds are normal. There is no tenderness.  Psychiatric: Mood, memory, affect and judgment normal.  Nursing note and vitals reviewed.    Assessment & Plan  1. Controlled type 2 diabetes mellitus without complication, without long-term current use of insulin (HCC) A1c is 6.4%, well-controlled diabetes. Continue on metformin - POCT HgB A1C - metFORMIN (GLUCOPHAGE) 500 MG tablet; Take 1 tablet (500 mg total) by mouth 2 (two) times daily with a meal.  Dispense: 180 tablet; Refill: 0 - POCT Glucose (CBG)  2. Dyslipidemia associated with type 2 diabetes mellitus (HCC)  - atorvastatin (LIPITOR) 10 MG tablet; Take 1 tablet (10 mg total) by mouth daily at 6 PM.  Dispense: 90 tablet; Refill: 0  3. Gastroesophageal reflux disease, esophagitis presence not specified  - omeprazole (PRILOSEC) 20 MG capsule; Take 1 capsule (20 mg total) by mouth daily.  Dispense: 90 capsule; Refill: 0   Akeria Hedstrom Asad A. [facility] 12/31/2015 8:40 AM

## 2016-01-03 LAB — POCT GLYCOSYLATED HEMOGLOBIN (HGB A1C): HEMOGLOBIN A1C: 6.4

## 2016-02-18 DIAGNOSIS — E119 Type 2 diabetes mellitus without complications: Secondary | ICD-10-CM | POA: Diagnosis not present

## 2016-02-25 DIAGNOSIS — Z85828 Personal history of other malignant neoplasm of skin: Secondary | ICD-10-CM | POA: Diagnosis not present

## 2016-02-25 DIAGNOSIS — L570 Actinic keratosis: Secondary | ICD-10-CM | POA: Diagnosis not present

## 2016-03-30 ENCOUNTER — Other Ambulatory Visit: Payer: Self-pay | Admitting: Family Medicine

## 2016-03-30 DIAGNOSIS — E119 Type 2 diabetes mellitus without complications: Secondary | ICD-10-CM

## 2016-04-07 ENCOUNTER — Ambulatory Visit: Payer: Medicare Other | Admitting: Family Medicine

## 2016-04-14 ENCOUNTER — Ambulatory Visit: Payer: Medicare Other | Admitting: Family Medicine

## 2016-04-18 ENCOUNTER — Other Ambulatory Visit: Payer: Self-pay | Admitting: Family Medicine

## 2016-04-18 DIAGNOSIS — E785 Hyperlipidemia, unspecified: Principal | ICD-10-CM

## 2016-04-18 DIAGNOSIS — E1169 Type 2 diabetes mellitus with other specified complication: Secondary | ICD-10-CM

## 2016-04-21 ENCOUNTER — Encounter: Payer: Self-pay | Admitting: Family Medicine

## 2016-04-21 ENCOUNTER — Ambulatory Visit (INDEPENDENT_AMBULATORY_CARE_PROVIDER_SITE_OTHER): Payer: Medicare Other | Admitting: Family Medicine

## 2016-04-21 VITALS — BP 124/72 | HR 80 | Temp 97.4°F | Resp 16 | Ht 67.0 in | Wt 162.2 lb

## 2016-04-21 DIAGNOSIS — E1169 Type 2 diabetes mellitus with other specified complication: Secondary | ICD-10-CM

## 2016-04-21 DIAGNOSIS — E785 Hyperlipidemia, unspecified: Secondary | ICD-10-CM | POA: Diagnosis not present

## 2016-04-21 DIAGNOSIS — K219 Gastro-esophageal reflux disease without esophagitis: Secondary | ICD-10-CM

## 2016-04-21 DIAGNOSIS — E119 Type 2 diabetes mellitus without complications: Secondary | ICD-10-CM

## 2016-04-21 LAB — COMPLETE METABOLIC PANEL WITH GFR
ALBUMIN: 4.5 g/dL (ref 3.6–5.1)
ALK PHOS: 85 U/L (ref 40–115)
ALT: 18 U/L (ref 9–46)
AST: 22 U/L (ref 10–35)
BUN: 21 mg/dL (ref 7–25)
CHLORIDE: 104 mmol/L (ref 98–110)
CO2: 28 mmol/L (ref 20–31)
CREATININE: 1.04 mg/dL (ref 0.70–1.18)
Calcium: 10.2 mg/dL (ref 8.6–10.3)
GFR, Est African American: 81 mL/min (ref >=60)
GFR, Est Non African American: 70 mL/min (ref >=60)
GLUCOSE: 117 mg/dL — AB (ref 65–99)
POTASSIUM: 5.4 mmol/L — AB (ref 3.5–5.3)
SODIUM: 141 mmol/L (ref 135–146)
Total Bilirubin: 0.9 mg/dL (ref 0.2–1.2)
Total Protein: 7.5 g/dL (ref 6.1–8.1)

## 2016-04-21 LAB — LIPID PANEL
CHOL/HDL RATIO: 2.1 ratio (ref <5.0)
Cholesterol: 115 mg/dL (ref <200)
HDL: 56 mg/dL (ref >40)
LDL Cholesterol: 44 mg/dL (ref <100)
Triglycerides: 75 mg/dL (ref <150)
VLDL: 15 mg/dL (ref <30)

## 2016-04-21 LAB — POCT GLYCOSYLATED HEMOGLOBIN (HGB A1C): Hemoglobin A1C: 6.5

## 2016-04-21 LAB — GLUCOSE, POCT (MANUAL RESULT ENTRY): POC GLUCOSE: 98 mg/dL (ref 70–99)

## 2016-04-21 MED ORDER — OMEPRAZOLE 20 MG PO CPDR: 20.0000 mg | DELAYED_RELEASE_CAPSULE | Freq: Every day | ORAL | 0 refills | Status: DC

## 2016-04-21 MED ORDER — ATORVASTATIN CALCIUM 10 MG PO TABS: 10.0000 mg | ORAL_TABLET | Freq: Every day | ORAL | 0 refills | Status: DC

## 2016-04-21 MED ORDER — METFORMIN HCL 500 MG PO TABS: 500.0000 mg | ORAL_TABLET | Freq: Two times a day (BID) | ORAL | 0 refills | Status: DC

## 2016-04-21 NOTE — Progress Notes (Signed)
Name: Billy Franco   MRN: [id number]    DOB: Jul 04, 1940   Date:04/21/2016       Progress Note  Subjective  Chief Complaint  Chief Complaint  Patient presents with  . Diabetes    follow up    Diabetes  He presents for his follow-up diabetic visit. He has type 2 diabetes mellitus. His disease course has been stable. Pertinent negatives for diabetes include no fatigue, no polydipsia and no polyuria. Pertinent negatives for diabetic complications include no CVA, heart disease or nephropathy. Current diabetic treatment includes oral agent (monotherapy). He is following a diabetic diet. His breakfast blood glucose range is generally 110-130 mg/dl. An ACE inhibitor/angiotensin II receptor blocker is not being taken. Eye exam is current.  Hyperlipidemia  This is a chronic problem. The problem is controlled. Recent lipid tests were reviewed and are normal. Pertinent negatives include no leg pain or myalgias. Current antihyperlipidemic treatment includes statins. The current treatment provides significant improvement of lipids. There are no compliance problems.   Gastroesophageal Reflux  He reports no abdominal pain, no choking, no dysphagia or no heartburn. This is a chronic problem. The problem has been unchanged. Pertinent negatives include no fatigue. He has tried a PPI for the symptoms. The treatment provided significant relief.     Past Medical History:  Diagnosis Date  . Arthritis   . Diabetes mellitus without complication [facility])     Past Surgical History:  Procedure Laterality Date  . NASAL FRACTURE SURGERY  1994  . URETEROLITHOTOMY Right 2015    Family History  Problem Relation Age of Onset  . Heart disease Father     Social History   Social History  . Marital status: Married    Spouse name: N/A  . Number of children: N/A  . Years of education: N/A   Occupational History  . Not on file.   Social History Main Topics  . Smoking status: Never Smoker  . Smokeless tobacco:  Never Used  . Alcohol use No  . Drug use: No  . Sexual activity: No   Other Topics Concern  . Not on file   Social History Narrative  . No narrative on file     Current Outpatient Prescriptions:  .  acetaminophen (TYLENOL) 650 MG CR tablet, Take 1 tablet (650 mg total) by mouth every 8 (eight) hours as needed for pain., Disp: 30 tablet, Rfl: 0 .  atorvastatin (LIPITOR) 10 MG tablet, TAKE 1 TABLET (10 MG TOTAL) BY MOUTH DAILY AT 6 PM., Disp: 90 tablet, Rfl: 0 .  B-D ULTRA-FINE 33 LANCETS MISC, , Disp: , Rfl:  .  BLOOD GLUCOSE MONITORING SUPPL, ONETOUCH ULTRA (Device) - Historical Medication Active, Disp: , Rfl:  .  docusate sodium (COLACE) 50 MG capsule, Take 50 mg by mouth daily as needed for mild constipation. , Disp: , Rfl:  .  fluticasone (FLONASE) 50 MCG/ACT nasal spray, Place 1 spray into both nostrils 2 (two) times daily., Disp: , Rfl:  .  glucose blood (ONE TOUCH ULTRA TEST) test strip, Use as directed to check BG daily., Disp: 100 each, Rfl: 0 .  metFORMIN (GLUCOPHAGE) 500 MG tablet, TAKE 1 TABLET (500 MG TOTAL) BY MOUTH 2 (TWO) TIMES DAILY WITH A MEAL., Disp: 180 tablet, Rfl: 0 .  MULTIPLE VITAMIN PO, Take by mouth., Disp: , Rfl:  .  Nutritional Supplements (THERALITH XR) TABS, Take 2 tablets by mouth daily. , Disp: , Rfl:  .  omeprazole (PRILOSEC) 20 MG capsule, Take 1 capsule (  20 mg total) by mouth daily., Disp: 90 capsule, Rfl: 0 .  ONETOUCH DELICA LANCETS 82X MISC, , Disp: , Rfl:  .  tamsulosin (FLOMAX) 0.4 MG CAPS capsule, Take 1 capsule by mouth daily., Disp: , Rfl:   Allergies  Allergen Reactions  . Codeine Rash and Other (See Comments)    Per pt "hard on my kidneys"  Per pt "hard on my kidneys"  Per pt "hard on my kidneys"   . Lovastatin Rash and Other (See Comments)     Review of Systems  Constitutional: Negative for fatigue.  Respiratory: Negative for choking.   Gastrointestinal: Negative for abdominal pain, dysphagia and heartburn.  Musculoskeletal:  Negative for myalgias.  Endo/Heme/Allergies: Negative for polydipsia.    Objective  Vitals:   04/21/16 1114  BP: 124/72  Pulse: 80  Resp: 16  Temp: 97.4 F (36.3 C)  TempSrc: Oral  SpO2: 99%  Weight: 162 lb 3.2 oz (73.6 kg)  Height: 5\' 7"  (1.702 m)    Physical Exam  Constitutional: He is well-developed, well-nourished, and in no distress.  Cardiovascular: Normal rate, regular rhythm, S1 normal, S2 normal and normal heart sounds.   No murmur heard. Pulmonary/Chest: Breath sounds normal. No respiratory distress. He has no decreased breath sounds. He has no wheezes.  Abdominal: Soft. Bowel sounds are normal. There is no tenderness.  Musculoskeletal:       Right ankle: He exhibits no swelling.       Left ankle: He exhibits no swelling.  Psychiatric: Mood, memory, affect and judgment normal.  Nursing note and vitals reviewed.   Recent Results (from the past 2160 hour(s))  POCT HgB A1C     Status: None   Collection Time: 04/21/16 11:18 AM  Result Value Ref Range   Hemoglobin A1C 6.5   POCT Glucose (CBG)     Status: None   Collection Time: 04/21/16 11:18 AM  Result Value Ref Range   POC Glucose 98 70 - 99 mg/dl     Assessment & Plan  1. Controlled type 2 diabetes mellitus without complication, without long-term current use of insulin (HCC) Point-of-care A1c 6.5%, well-controlled diabetes - POCT HgB A1C - POCT Glucose (CBG) - metFORMIN (GLUCOPHAGE) 500 MG tablet; Take 1 tablet (500 mg total) by mouth 2 (two) times daily with a meal.  Dispense: 180 tablet; Refill: 0 - Urine Microalbumin w/creat. ratio  2. Dyslipidemia associated with type 2 diabetes mellitus (HCC)  - atorvastatin (LIPITOR) 10 MG tablet; Take 1 tablet (10 mg total) by mouth daily at 6 PM.  Dispense: 90 tablet; Refill: 0 - Lipid panel - COMPLETE METABOLIC PANEL WITH GFR  3. Gastroesophageal reflux disease, esophagitis presence not specified  - omeprazole (PRILOSEC) 20 MG capsule; Take 1 capsule (20  mg total) by mouth daily.  Dispense: 90 capsule; Refill: 0  Billy Franco Asad A. [facility] 04/21/2016 11:23 AM

## 2016-04-22 LAB — MICROALBUMIN / CREATININE URINE RATIO
CREATININE, URINE: 137 mg/dL (ref 20–370)
MICROALB/CREAT RATIO: 15 ug/mg{creat} (ref <30)
Microalb, Ur: 2 mg/dL

## 2016-05-12 DIAGNOSIS — R351 Nocturia: Secondary | ICD-10-CM | POA: Diagnosis not present

## 2016-05-12 DIAGNOSIS — N401 Enlarged prostate with lower urinary tract symptoms: Secondary | ICD-10-CM | POA: Diagnosis not present

## 2016-05-12 DIAGNOSIS — N5201 Erectile dysfunction due to arterial insufficiency: Secondary | ICD-10-CM | POA: Diagnosis not present

## 2016-06-30 ENCOUNTER — Other Ambulatory Visit: Payer: Self-pay | Admitting: Family Medicine

## 2016-06-30 DIAGNOSIS — E119 Type 2 diabetes mellitus without complications: Secondary | ICD-10-CM

## 2016-07-21 ENCOUNTER — Encounter: Payer: Self-pay | Admitting: Family Medicine

## 2016-07-21 ENCOUNTER — Ambulatory Visit (INDEPENDENT_AMBULATORY_CARE_PROVIDER_SITE_OTHER): Payer: Medicare Other | Admitting: Family Medicine

## 2016-07-21 VITALS — BP 120/78 | HR 67 | Temp 97.5°F | Resp 14 | Ht 67.0 in | Wt 156.3 lb

## 2016-07-21 DIAGNOSIS — E119 Type 2 diabetes mellitus without complications: Secondary | ICD-10-CM

## 2016-07-21 DIAGNOSIS — K219 Gastro-esophageal reflux disease without esophagitis: Secondary | ICD-10-CM

## 2016-07-21 DIAGNOSIS — E785 Hyperlipidemia, unspecified: Secondary | ICD-10-CM | POA: Diagnosis not present

## 2016-07-21 DIAGNOSIS — E1169 Type 2 diabetes mellitus with other specified complication: Secondary | ICD-10-CM

## 2016-07-21 LAB — POCT GLYCOSYLATED HEMOGLOBIN (HGB A1C): Hemoglobin A1C: 6.1

## 2016-07-21 MED ORDER — ATORVASTATIN CALCIUM 10 MG PO TABS: 10.0000 mg | ORAL_TABLET | Freq: Every day | ORAL | 0 refills | Status: DC

## 2016-07-21 MED ORDER — METFORMIN HCL 500 MG PO TABS: 500.0000 mg | ORAL_TABLET | Freq: Two times a day (BID) | ORAL | 0 refills | Status: DC

## 2016-07-21 MED ORDER — OMEPRAZOLE 20 MG PO CPDR: 20.0000 mg | DELAYED_RELEASE_CAPSULE | Freq: Every day | ORAL | 0 refills | Status: DC

## 2016-07-21 NOTE — Progress Notes (Signed)
Name: Billy Franco   MRN: [id number]    DOB: 1940-11-04   Date:07/21/2016       Progress Note  Subjective  Chief Complaint  Chief Complaint  Patient presents with  . Follow-up    3 month  . Diabetes  . Medication Refill    atorvastatin,metformin and omeprazole    Diabetes  He presents for his follow-up diabetic visit. He has type 2 diabetes mellitus. His disease course has been stable. Pertinent negatives for diabetes include no polydipsia and no polyuria. Pertinent negatives for diabetic complications include no CVA, heart disease or nephropathy. Current diabetic treatment includes oral agent (monotherapy). He is following a diabetic diet. His breakfast blood glucose range is generally 110-130 mg/dl. An ACE inhibitor/angiotensin II receptor blocker is not being taken. Eye exam is current.  Gastroesophageal Reflux  He reports no choking, no dysphagia or no heartburn. This is a chronic problem. The problem has been unchanged. He has tried a PPI for the symptoms. The treatment provided significant relief.  Hyperlipidemia  This is a chronic problem. The problem is controlled. Recent lipid tests were reviewed and are normal. Pertinent negatives include no leg pain, myalgias or shortness of breath. Current antihyperlipidemic treatment includes statins.      Past Medical History:  Diagnosis Date  . Arthritis   . Diabetes mellitus without complication [facility])     Past Surgical History:  Procedure Laterality Date  . NASAL FRACTURE SURGERY  1994  . URETEROLITHOTOMY Right 2015    Family History  Problem Relation Age of Onset  . Heart disease Father     Social History   Social History  . Marital status: Married    Spouse name: N/A  . Number of children: N/A  . Years of education: N/A   Occupational History  . Not on file.   Social History Main Topics  . Smoking status: Never Smoker  . Smokeless tobacco: Never Used  . Alcohol use No  . Drug use: No  . Sexual activity: No    Other Topics Concern  . Not on file   Social History Narrative  . No narrative on file     Current Outpatient Prescriptions:  .  ibuprofen (ADVIL,MOTRIN) 200 MG tablet, Take 200 mg by mouth daily., Disp: , Rfl:  .  atorvastatin (LIPITOR) 10 MG tablet, Take 1 tablet (10 mg total) by mouth daily at 6 PM., Disp: 90 tablet, Rfl: 0 .  B-D ULTRA-FINE 33 LANCETS MISC, , Disp: , Rfl:  .  BLOOD GLUCOSE MONITORING SUPPL, ONETOUCH ULTRA (Device) - Historical Medication Active, Disp: , Rfl:  .  docusate sodium (COLACE) 50 MG capsule, Take 50 mg by mouth daily as needed for mild constipation. , Disp: , Rfl:  .  fluticasone (FLONASE) 50 MCG/ACT nasal spray, Place 1 spray into both nostrils 2 (two) times daily., Disp: , Rfl:  .  glucose blood (ONE TOUCH ULTRA TEST) test strip, Use as directed to check BG daily., Disp: 100 each, Rfl: 0 .  metFORMIN (GLUCOPHAGE) 500 MG tablet, Take 1 tablet (500 mg total) by mouth 2 (two) times daily with a meal., Disp: 180 tablet, Rfl: 0 .  metFORMIN (GLUCOPHAGE) 500 MG tablet, TAKE 1 TABLET (500 MG TOTAL) BY MOUTH 2 (TWO) TIMES DAILY WITH A MEAL., Disp: 180 tablet, Rfl: 0 .  MULTIPLE VITAMIN PO, Take by mouth., Disp: , Rfl:  .  Nutritional Supplements (THERALITH XR) TABS, Take 2 tablets by mouth daily. , Disp: , Rfl:  .  omeprazole (PRILOSEC) 20 MG capsule, Take 1 capsule (20 mg total) by mouth daily., Disp: 90 capsule, Rfl: 0 .  ONETOUCH DELICA LANCETS 55M MISC, , Disp: , Rfl:  .  tamsulosin (FLOMAX) 0.4 MG CAPS capsule, Take 1 capsule by mouth daily., Disp: , Rfl:   Allergies  Allergen Reactions  . Codeine Rash and Other (See Comments)    Per pt "hard on my kidneys"  Per pt "hard on my kidneys"  Per pt "hard on my kidneys"   . Lovastatin Rash and Other (See Comments)     Review of Systems  Respiratory: Negative for choking and shortness of breath.   Gastrointestinal: Negative for dysphagia and heartburn.  Musculoskeletal: Negative for myalgias.   Endo/Heme/Allergies: Negative for polydipsia.      Objective  Vitals:   07/21/16 1004  BP: 120/78  Pulse: 67  Resp: 14  Temp: 97.5 F (36.4 C)  TempSrc: Oral  SpO2: 99%  Weight: 156 lb 4.8 oz (70.9 kg)  Height: 5\' 7"  (1.702 m)    Physical Exam  Constitutional: He is well-developed, well-nourished, and in no distress.  Cardiovascular: Normal rate, regular rhythm, S1 normal, S2 normal and normal heart sounds.   No murmur heard. Pulmonary/Chest: Breath sounds normal. No respiratory distress. He has no decreased breath sounds. He has no wheezes.  Abdominal: Soft. Bowel sounds are normal. There is no tenderness.  Musculoskeletal:       Right ankle: He exhibits no swelling.       Left ankle: He exhibits no swelling.  Psychiatric: Mood, memory, affect and judgment normal.  Nursing note and vitals reviewed.      Recent Results (from the past 2160 hour(s))  POCT HgB A1C     Status: Normal   Collection Time: 07/21/16 10:11 AM  Result Value Ref Range   Hemoglobin A1C 6.1      Assessment & Plan  1. Controlled type 2 diabetes mellitus without complication, without long-term current use of insulin (HCC)   A1c 6.1%, well-controlled diabetes - POCT HgB A1C - metFORMIN (GLUCOPHAGE) 500 MG tablet; Take 1 tablet (500 mg total) by mouth 2 (two) times daily with a meal.  Dispense: 180 tablet; Refill: 0  2. Dyslipidemia associated with type 2 diabetes mellitus (HCC)  - atorvastatin (LIPITOR) 10 MG tablet; Take 1 tablet (10 mg total) by mouth daily at 6 PM.  Dispense: 90 tablet; Refill: 0  3. Gastroesophageal reflux disease, esophagitis presence not specified  - omeprazole (PRILOSEC) 20 MG capsule; Take 1 capsule (20 mg total) by mouth daily.  Dispense: 90 capsule; Refill: 0   Joclyn Alsobrook [facility] 07/21/2016 10:17 AM

## 2016-10-27 ENCOUNTER — Encounter: Payer: Self-pay | Admitting: Family Medicine

## 2016-10-27 ENCOUNTER — Ambulatory Visit
Admission: RE | Admit: 2016-10-27 | Discharge: 2016-10-27 | Disposition: A | Discharge status: Home / Self Care | Payer: Medicare Other | Source: Ambulatory Visit | Attending: Family Medicine | Admitting: Family Medicine

## 2016-10-27 ENCOUNTER — Ambulatory Visit (INDEPENDENT_AMBULATORY_CARE_PROVIDER_SITE_OTHER): Payer: Medicare Other | Admitting: Family Medicine

## 2016-10-27 VITALS — BP 118/70 | HR 73 | Temp 97.5°F | Resp 16 | Ht 67.0 in | Wt 155.8 lb

## 2016-10-27 DIAGNOSIS — E785 Hyperlipidemia, unspecified: Secondary | ICD-10-CM | POA: Diagnosis not present

## 2016-10-27 DIAGNOSIS — K219 Gastro-esophageal reflux disease without esophagitis: Secondary | ICD-10-CM | POA: Diagnosis not present

## 2016-10-27 DIAGNOSIS — E1169 Type 2 diabetes mellitus with other specified complication: Secondary | ICD-10-CM | POA: Diagnosis not present

## 2016-10-27 DIAGNOSIS — E119 Type 2 diabetes mellitus without complications: Secondary | ICD-10-CM | POA: Diagnosis not present

## 2016-10-27 DIAGNOSIS — M47816 Spondylosis without myelopathy or radiculopathy, lumbar region: Secondary | ICD-10-CM

## 2016-10-27 DIAGNOSIS — M545 Low back pain: Secondary | ICD-10-CM | POA: Diagnosis not present

## 2016-10-27 LAB — COMPLETE METABOLIC PANEL WITH GFR
AG RATIO: 1.5 (calc) (ref 1.0–2.5)
ALT: 18 U/L (ref 9–46)
AST: 19 U/L (ref 10–35)
Albumin: 4.2 g/dL (ref 3.6–5.1)
Alkaline phosphatase (APISO): 93 U/L (ref 40–115)
BUN: 21 mg/dL (ref 7–25)
CALCIUM: 9.6 mg/dL (ref 8.6–10.3)
CO2: 31 mmol/L (ref 20–32)
Chloride: 105 mmol/L (ref 98–110)
Creat: 0.84 mg/dL (ref 0.70–1.18)
GFR, EST AFRICAN AMERICAN: 99 mL/min/{1.73_m2} (ref >=60)
GFR, EST NON AFRICAN AMERICAN: 85 mL/min/{1.73_m2} (ref >=60)
Globulin: 2.8 g/dL (calc) (ref 1.9–3.7)
Glucose, Bld: 130 mg/dL — ABNORMAL HIGH (ref 65–99)
Potassium: 4.9 mmol/L (ref 3.5–5.3)
Sodium: 141 mmol/L (ref 135–146)
TOTAL PROTEIN: 7 g/dL (ref 6.1–8.1)
Total Bilirubin: 0.6 mg/dL (ref 0.2–1.2)

## 2016-10-27 LAB — LIPID PANEL
Cholesterol: 122 mg/dL (ref <200)
HDL: 60 mg/dL (ref >40)
LDL Cholesterol (Calc): 50 mg/dL (calc)
NON-HDL CHOLESTEROL (CALC): 62 mg/dL (ref <130)
Total CHOL/HDL Ratio: 2 (calc) (ref <5.0)
Triglycerides: 50 mg/dL (ref <150)

## 2016-10-27 LAB — POCT GLYCOSYLATED HEMOGLOBIN (HGB A1C): Hemoglobin A1C: 6.3

## 2016-10-27 MED ORDER — OMEPRAZOLE 20 MG PO CPDR: 20.0000 mg | DELAYED_RELEASE_CAPSULE | Freq: Every day | ORAL | 0 refills | Status: DC

## 2016-10-27 MED ORDER — METFORMIN HCL 500 MG PO TABS: 500.0000 mg | ORAL_TABLET | Freq: Two times a day (BID) | ORAL | 0 refills | Status: DC

## 2016-10-27 MED ORDER — ATORVASTATIN CALCIUM 10 MG PO TABS
10.0000 mg | ORAL_TABLET | Freq: Every day | ORAL | 0 refills | Status: DC
Start: 2016-10-27 — End: 2017-01-25

## 2016-10-27 NOTE — Progress Notes (Signed)
Name: Billy Franco   MRN: [id number]    DOB: 1940/06/07   Date:10/27/2016       Progress Note  Subjective  Chief Complaint  Chief Complaint  Patient presents with  . Diabetes    follow up BS at home 153  . Hyperlipidemia    Diabetes  He presents for his follow-up diabetic visit. He has type 2 diabetes mellitus. His disease course has been stable. There are no hypoglycemic associated symptoms. Pertinent negatives for hypoglycemia include no dizziness, headaches or pallor. Pertinent negatives for diabetes include no chest pain, no fatigue, no foot paresthesias, no polydipsia and no polyuria. Pertinent negatives for diabetic complications include no CVA, heart disease or peripheral neuropathy. He is compliant with treatment all of the time. His weight is stable. He is following a diabetic diet. He rarely participates in exercise. He monitors blood glucose at home 1-2 x per day. His breakfast blood glucose range is generally 140-180 mg/dl. An ACE inhibitor/angiotensin II receptor blocker is being taken. Eye exam is current.  Hyperlipidemia  This is a chronic problem. The problem is controlled. Recent lipid tests were reviewed and are normal. Pertinent negatives include no chest pain, leg pain, myalgias or shortness of breath. Current antihyperlipidemic treatment includes statins.  Gastroesophageal Reflux  He reports no abdominal pain, no belching, no chest pain, no coughing, no heartburn or no sore throat. This is a chronic problem. The problem occurs frequently. The symptoms are aggravated by certain foods. Pertinent negatives include no fatigue. He has tried a PPI for the symptoms. The treatment provided significant relief.    Arthritis Lumbar Spine: Patient has degenerative arthritis of the lumbar spine, has difficulty getting up from bed or chair, getting in and out of a car and walking etc. He has no recent x rays of lumbar spine on file. Takes Advil 2 tabs in morning, which seems to help. No  history of lumbar spine surgery patient would like me to fill out and complete the application for renewal of handicap placard which was originally given in November 2013.Marland Kitchen   Past Medical History:  Diagnosis Date  . Arthritis   . Diabetes mellitus without complication [facility])     Past Surgical History:  Procedure Laterality Date  . NASAL FRACTURE SURGERY  1994  . URETEROLITHOTOMY Right 2015    Family History  Problem Relation Age of Onset  . Heart disease Father     Social History   Social History  . Marital status: Married    Spouse name: N/A  . Number of children: N/A  . Years of education: N/A   Occupational History  . Not on file.   Social History Main Topics  . Smoking status: Never Smoker  . Smokeless tobacco: Never Used  . Alcohol use No  . Drug use: No  . Sexual activity: No   Other Topics Concern  . Not on file   Social History Narrative  . No narrative on file     Current Outpatient Prescriptions:  .  atorvastatin (LIPITOR) 10 MG tablet, Take 1 tablet (10 mg total) by mouth daily at 6 PM., Disp: 90 tablet, Rfl: 0 .  B-D ULTRA-FINE 33 LANCETS MISC, , Disp: , Rfl:  .  BLOOD GLUCOSE MONITORING SUPPL, ONETOUCH ULTRA (Device) - Historical Medication Active, Disp: , Rfl:  .  docusate sodium (COLACE) 50 MG capsule, Take 50 mg by mouth daily as needed for mild constipation. , Disp: , Rfl:  .  fluticasone (FLONASE) 50 MCG/ACT nasal  spray, Place 1 spray into both nostrils 2 (two) times daily., Disp: , Rfl:  .  glucose blood (ONE TOUCH ULTRA TEST) test strip, Use as directed to check BG daily., Disp: 100 each, Rfl: 0 .  ibuprofen (ADVIL,MOTRIN) 200 MG tablet, Take 200 mg by mouth daily., Disp: , Rfl:  .  metFORMIN (GLUCOPHAGE) 500 MG tablet, TAKE 1 TABLET (500 MG TOTAL) BY MOUTH 2 (TWO) TIMES DAILY WITH A MEAL., Disp: 180 tablet, Rfl: 0 .  metFORMIN (GLUCOPHAGE) 500 MG tablet, Take 1 tablet (500 mg total) by mouth 2 (two) times daily with a meal., Disp: 180 tablet,  Rfl: 0 .  MULTIPLE VITAMIN PO, Take by mouth., Disp: , Rfl:  .  Nutritional Supplements (THERALITH XR) TABS, Take 2 tablets by mouth daily. , Disp: , Rfl:  .  omeprazole (PRILOSEC) 20 MG capsule, Take 1 capsule (20 mg total) by mouth daily., Disp: 90 capsule, Rfl: 0 .  ONETOUCH DELICA LANCETS 85O MISC, , Disp: , Rfl:  .  tamsulosin (FLOMAX) 0.4 MG CAPS capsule, Take 1 capsule by mouth daily., Disp: , Rfl:   Allergies  Allergen Reactions  . Codeine Rash and Other (See Comments)    Per pt "hard on my kidneys"  Per pt "hard on my kidneys"  Per pt "hard on my kidneys"   . Lovastatin Rash and Other (See Comments)     Review of Systems  Constitutional: Negative for fatigue.  HENT: Negative for sore throat.   Respiratory: Negative for cough and shortness of breath.   Cardiovascular: Negative for chest pain.  Gastrointestinal: Negative for abdominal pain and heartburn.  Musculoskeletal: Negative for myalgias.  Skin: Negative for pallor.  Neurological: Negative for dizziness and headaches.  Endo/Heme/Allergies: Negative for polydipsia.      Objective  Vitals:   10/27/16 0905  BP: 118/70  Pulse: 73  Resp: 16  Temp: (!) 97.5 F (36.4 C)  TempSrc: Oral  SpO2: 94%  Weight: 155 lb 12.8 oz (70.7 kg)  Height: 5\' 7"  (1.702 m)    Physical Exam  Constitutional: He is well-developed, well-nourished, and in no distress.  Cardiovascular: Normal rate, regular rhythm, S1 normal, S2 normal and normal heart sounds.   No murmur heard. Pulmonary/Chest: Breath sounds normal. No respiratory distress. He has no decreased breath sounds. He has no wheezes.  Abdominal: Soft. Bowel sounds are normal. There is no tenderness.  Musculoskeletal:       Right ankle: He exhibits no swelling.       Left ankle: He exhibits no swelling.  Psychiatric: Mood, memory, affect and judgment normal.  Nursing note and vitals reviewed.      Recent Results (from the past 2160 hour(s))  POCT HgB A1C      Status: None   Collection Time: 10/27/16  9:12 AM  Result Value Ref Range   Hemoglobin A1C 6.3      Assessment & Plan  1. Controlled type 2 diabetes mellitus without complication, without long-term current use of insulin (HCC) A1c 6.3%, well-controlled diabetes - POCT HgB A1C - metFORMIN (GLUCOPHAGE) 500 MG tablet; Take 1 tablet (500 mg total) by mouth 2 (two) times daily with a meal.  Dispense: 180 tablet; Refill: 0  2. Dyslipidemia associated with type 2 diabetes mellitus (HCC) Obtain FLP, continue on statin - atorvastatin (LIPITOR) 10 MG tablet; Take 1 tablet (10 mg total) by mouth daily at 6 PM.  Dispense: 90 tablet; Refill: 0 - Lipid panel - COMPLETE METABOLIC PANEL WITH GFR  3.  Gastroesophageal reflux disease, esophagitis presence not specified Symptoms of acid reflux are stable and controlled on omeprazole - omeprazole (PRILOSEC) 20 MG capsule; Take 1 capsule (20 mg total) by mouth daily.  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 10/27/2016 9:24 AM

## 2016-10-28 ENCOUNTER — Telehealth: Payer: Self-pay

## 2016-10-28 NOTE — Telephone Encounter (Signed)
-----   Message from Roselee Nova, MD sent at 10/27/2016  6:34 PM EDT ----- Fasting lipid panel: normal total cholesterol, HDL, triglycerides and LDL cholesterol CMP: Elevated glucose at 130 mg/dL, normal electrolytes, kidney function and liver enzymes

## 2016-10-28 NOTE — Telephone Encounter (Signed)
Called pt's wife informed her of the labs below, she gave verbal understanding requests that a copy of labs be mailed to her, will mail today.

## 2017-01-25 ENCOUNTER — Other Ambulatory Visit: Payer: Self-pay | Admitting: Family Medicine

## 2017-01-25 DIAGNOSIS — E785 Hyperlipidemia, unspecified: Principal | ICD-10-CM

## 2017-01-25 DIAGNOSIS — E1169 Type 2 diabetes mellitus with other specified complication: Secondary | ICD-10-CM

## 2017-02-02 ENCOUNTER — Ambulatory Visit (INDEPENDENT_AMBULATORY_CARE_PROVIDER_SITE_OTHER): Payer: Medicare Other | Admitting: Family Medicine

## 2017-02-02 ENCOUNTER — Encounter: Payer: Self-pay | Admitting: Family Medicine

## 2017-02-02 VITALS — BP 128/62 | HR 71 | Temp 97.7°F | Resp 14 | Ht 67.0 in | Wt 159.5 lb

## 2017-02-02 DIAGNOSIS — K219 Gastro-esophageal reflux disease without esophagitis: Secondary | ICD-10-CM

## 2017-02-02 DIAGNOSIS — E1169 Type 2 diabetes mellitus with other specified complication: Secondary | ICD-10-CM

## 2017-02-02 DIAGNOSIS — E785 Hyperlipidemia, unspecified: Secondary | ICD-10-CM

## 2017-02-02 DIAGNOSIS — E119 Type 2 diabetes mellitus without complications: Secondary | ICD-10-CM

## 2017-02-02 LAB — GLUCOSE, POCT (MANUAL RESULT ENTRY): POC Glucose: 114 mg/dl — AB (ref 70–99)

## 2017-02-02 LAB — POCT GLYCOSYLATED HEMOGLOBIN (HGB A1C): HEMOGLOBIN A1C: 6.4

## 2017-02-02 MED ORDER — BD LANCET ULTRAFINE 33G MISC: 3 refills | Status: DC

## 2017-02-02 MED ORDER — OMEPRAZOLE 20 MG PO CPDR: 20.0000 mg | DELAYED_RELEASE_CAPSULE | Freq: Every day | ORAL | 0 refills | Status: DC

## 2017-02-02 MED ORDER — GLUCOSE BLOOD VI STRP: ORAL_STRIP | 3 refills | Status: DC

## 2017-02-02 MED ORDER — METFORMIN HCL 500 MG PO TABS: 500.0000 mg | ORAL_TABLET | Freq: Two times a day (BID) | ORAL | 0 refills | Status: DC

## 2017-02-02 NOTE — Progress Notes (Signed)
Name: Billy Franco   MRN: [id number]    DOB: March 20, 1940   Date:02/02/2017       Progress Note  Subjective  Chief Complaint  Chief Complaint  Patient presents with  . Diabetes    Pt checks his sugars every morning fasting. Pt states its up and down depends on what he eats. highest 150; never run higher than that   . Hyperlipidemia  . Medication Refill    Diabetes  He presents for his follow-up diabetic visit. He has type 2 diabetes mellitus. His disease course has been stable. Pertinent negatives for diabetes include no chest pain, no fatigue, no foot paresthesias, no polydipsia and no polyuria. Symptoms are stable. Pertinent negatives for diabetic complications include no CVA, heart disease or nephropathy. Current diabetic treatment includes oral agent (monotherapy). He is following a diabetic and generally healthy diet. He participates in exercise weekly (works three times a week). His breakfast blood glucose range is generally 130-140 mg/dl. An ACE inhibitor/angiotensin II receptor blocker is not being taken. Eye exam is current.  Hyperlipidemia  This is a chronic problem. The problem is controlled. Recent lipid tests were reviewed and are normal. Pertinent negatives include no chest pain, leg pain or myalgias. Current antihyperlipidemic treatment includes statins. The current treatment provides significant improvement of lipids. There are no compliance problems.   Gastroesophageal Reflux  He reports no abdominal pain, no belching, no chest pain, no choking, no dysphagia or no heartburn. This is a chronic problem. The problem has been unchanged. Pertinent negatives include no fatigue. He has tried a PPI for the symptoms. The treatment provided significant relief.     Past Medical History:  Diagnosis Date  . Arthritis   . Diabetes mellitus without complication [facility])     Past Surgical History:  Procedure Laterality Date  . NASAL FRACTURE SURGERY  1994  . URETEROLITHOTOMY Right 2015     Family History  Problem Relation Age of Onset  . Heart disease Father     Social History   Socioeconomic History  . Marital status: Married    Spouse name: Not on file  . Number of children: Not on file  . Years of education: Not on file  . Highest education level: Not on file  Social Needs  . Financial resource strain: Not on file  . Food insecurity - worry: Not on file  . Food insecurity - inability: Not on file  . Transportation needs - medical: Not on file  . Transportation needs - non-medical: Not on file  Occupational History  . Not on file  Tobacco Use  . Smoking status: Never Smoker  . Smokeless tobacco: Never Used  Substance and Sexual Activity  . Alcohol use: No    Alcohol/week: 0.0 oz  . Drug use: No  . Sexual activity: No  Other Topics Concern  . Not on file  Social History Narrative  . Not on file     Current Outpatient Medications:  .  atorvastatin (LIPITOR) 10 MG tablet, TAKE 1 TABLET (10 MG TOTAL) BY MOUTH DAILY AT 6 PM., Disp: 90 tablet, Rfl: 0 .  B-D ULTRA-FINE 33 LANCETS MISC, , Disp: , Rfl:  .  BLOOD GLUCOSE MONITORING SUPPL, ONETOUCH ULTRA (Device) - Historical Medication Active, Disp: , Rfl:  .  glucose blood (ONE TOUCH ULTRA TEST) test strip, Use as directed to check BG daily., Disp: 100 each, Rfl: 0 .  ibuprofen (ADVIL,MOTRIN) 200 MG tablet, Take 200 mg by mouth daily., Disp: , Rfl:  .  metFORMIN (GLUCOPHAGE) 500 MG tablet, Take 1 tablet (500 mg total) by mouth 2 (two) times daily with a meal., Disp: 180 tablet, Rfl: 0 .  MULTIPLE VITAMIN PO, Take by mouth., Disp: , Rfl:  .  Nutritional Supplements (THERALITH XR) TABS, Take 2 tablets by mouth daily. , Disp: , Rfl:  .  omeprazole (PRILOSEC) 20 MG capsule, Take 1 capsule (20 mg total) by mouth daily., Disp: 90 capsule, Rfl: 0 .  ONETOUCH DELICA LANCETS 13K MISC, , Disp: , Rfl:  .  tamsulosin (FLOMAX) 0.4 MG CAPS capsule, Take 1 capsule by mouth daily., Disp: , Rfl:  .  docusate sodium  (COLACE) 50 MG capsule, Take 50 mg by mouth daily as needed for mild constipation. , Disp: , Rfl:  .  fluticasone (FLONASE) 50 MCG/ACT nasal spray, Place 1 spray into both nostrils 2 (two) times daily., Disp: , Rfl:   Allergies  Allergen Reactions  . Codeine Rash and Other (See Comments)    Per pt "hard on my kidneys"  Per pt "hard on my kidneys"  Per pt "hard on my kidneys"   . Lovastatin Rash and Other (See Comments)     Review of Systems  Constitutional: Negative for fatigue.  Respiratory: Negative for choking.   Cardiovascular: Negative for chest pain.  Gastrointestinal: Negative for abdominal pain, dysphagia and heartburn.  Musculoskeletal: Negative for myalgias.  Endo/Heme/Allergies: Negative for polydipsia.     Objective  Vitals:   02/02/17 0922  BP: 128/62  Pulse: 71  Resp: 14  Temp: 97.7 F (36.5 C)  TempSrc: Oral  SpO2: 99%  Weight: 159 lb 8 oz (72.3 kg)  Height: 5\' 7"  (1.702 m)    Physical Exam  Constitutional: He is oriented to person, place, and time and well-developed, well-nourished, and in no distress.  HENT:  Head: Normocephalic and atraumatic.  Cardiovascular: Normal rate, regular rhythm, S1 normal, S2 normal and normal heart sounds.  No murmur heard. Pulmonary/Chest: Breath sounds normal. No respiratory distress. He has no decreased breath sounds. He has no wheezes.  Abdominal: Soft. Bowel sounds are normal. There is no tenderness.  Musculoskeletal: He exhibits no edema.       Right ankle: He exhibits no swelling.       Left ankle: He exhibits no swelling.  Neurological: He is alert and oriented to person, place, and time.  Psychiatric: Mood, memory, affect and judgment normal.  Nursing note and vitals reviewed.      Recent Results (from the past 2160 hour(s))  POCT Glucose (CBG)     Status: Abnormal   Collection Time: 02/02/17  9:25 AM  Result Value Ref Range   POC Glucose 114 (A) 70 - 99 mg/dl  POCT HgB A1C     Status: Abnormal    Collection Time: 02/02/17  9:28 AM  Result Value Ref Range   Hemoglobin A1C 6.4      Assessment & Plan  1. Gastroesophageal reflux disease, esophagitis presence not specified Symptoms of reflux are stable on PPI treatment, refills provided - omeprazole (PRILOSEC) 20 MG capsule; Take 1 capsule (20 mg total) by mouth daily.  Dispense: 90 capsule; Refill: 0  2. Controlled type 2 diabetes mellitus without complication, without long-term current use of insulin (HCC) Point-of-care A1c 6.4%, which is worse from 6.1%, however overall well-controlled diabetes, no change in pharmacotherapy warranted at this time. Refills for lancets, test strips and metformin provided - POCT HgB A1C - POCT Glucose (CBG) - metFORMIN (GLUCOPHAGE) 500 MG tablet; Take 1  tablet (500 mg total) by mouth 2 (two) times daily with a meal.  Dispense: 180 tablet; Refill: 0 - glucose blood (ONE TOUCH ULTRA TEST) test strip; Use as directed to check BG once daily.  Dispense: 90 each; Refill: 3 - B-D ULTRA-FINE 33 LANCETS MISC; USe as directed to check BG once daily  Dispense: 90 each; Refill: 3  3. Dyslipidemia associated with type 2 diabetes mellitus ([place]) FLP at goal from October 2018, continue on statin   Kimberlly Norgard [facility] 02/02/2017 9:35 AM

## 2017-02-23 DIAGNOSIS — H40053 Ocular hypertension, bilateral: Secondary | ICD-10-CM | POA: Diagnosis not present

## 2017-02-23 LAB — HM DIABETES EYE EXAM

## 2017-02-27 ENCOUNTER — Encounter: Payer: Self-pay | Admitting: Family Medicine

## 2017-03-02 DIAGNOSIS — L570 Actinic keratosis: Secondary | ICD-10-CM | POA: Diagnosis not present

## 2017-03-02 DIAGNOSIS — Z85828 Personal history of other malignant neoplasm of skin: Secondary | ICD-10-CM | POA: Diagnosis not present

## 2017-03-19 DIAGNOSIS — J069 Acute upper respiratory infection, unspecified: Secondary | ICD-10-CM | POA: Diagnosis not present

## 2017-03-19 DIAGNOSIS — R05 Cough: Secondary | ICD-10-CM | POA: Diagnosis not present

## 2017-03-19 DIAGNOSIS — R509 Fever, unspecified: Secondary | ICD-10-CM | POA: Diagnosis not present

## 2017-04-20 ENCOUNTER — Other Ambulatory Visit: Payer: Self-pay

## 2017-04-20 DIAGNOSIS — E785 Hyperlipidemia, unspecified: Principal | ICD-10-CM

## 2017-04-20 DIAGNOSIS — E1169 Type 2 diabetes mellitus with other specified complication: Secondary | ICD-10-CM

## 2017-04-20 MED ORDER — ATORVASTATIN CALCIUM 10 MG PO TABS: 10.0000 mg | ORAL_TABLET | Freq: Every day | ORAL | 0 refills | Status: DC

## 2017-04-20 NOTE — Telephone Encounter (Signed)
Refill Request for Cholesterol medication. Atorvastatin to [facility].   Last visit:  02/02/2017   Lab Results  Component Value Date   CHOL 122 10/27/2016   HDL 60 10/27/2016   LDLCALC 50 10/27/2016   TRIG 50 10/27/2016   CHOLHDL 2.0 10/27/2016    Follow up 04/27/2017

## 2017-04-27 ENCOUNTER — Ambulatory Visit (INDEPENDENT_AMBULATORY_CARE_PROVIDER_SITE_OTHER): Payer: Medicare Other | Admitting: Family Medicine

## 2017-04-27 ENCOUNTER — Encounter: Payer: Self-pay | Admitting: Family Medicine

## 2017-04-27 ENCOUNTER — Ambulatory Visit: Payer: Medicare Other | Admitting: Family Medicine

## 2017-04-27 VITALS — BP 130/70 | HR 53 | Resp 16 | Ht 67.0 in | Wt 161.3 lb

## 2017-04-27 DIAGNOSIS — E1169 Type 2 diabetes mellitus with other specified complication: Secondary | ICD-10-CM

## 2017-04-27 DIAGNOSIS — E785 Hyperlipidemia, unspecified: Secondary | ICD-10-CM | POA: Diagnosis not present

## 2017-04-27 DIAGNOSIS — E119 Type 2 diabetes mellitus without complications: Secondary | ICD-10-CM

## 2017-04-27 DIAGNOSIS — M47816 Spondylosis without myelopathy or radiculopathy, lumbar region: Secondary | ICD-10-CM

## 2017-04-27 DIAGNOSIS — K219 Gastro-esophageal reflux disease without esophagitis: Secondary | ICD-10-CM | POA: Diagnosis not present

## 2017-04-27 LAB — POCT UA - MICROALBUMIN: Microalbumin Ur, POC: NEGATIVE mg/L

## 2017-04-27 MED ORDER — ATORVASTATIN CALCIUM 10 MG PO TABS: 10.0000 mg | ORAL_TABLET | Freq: Every day | ORAL | 1 refills | Status: DC

## 2017-04-27 MED ORDER — METFORMIN HCL 500 MG PO TABS: 500.0000 mg | ORAL_TABLET | Freq: Two times a day (BID) | ORAL | 1 refills | Status: DC

## 2017-04-27 MED ORDER — OMEPRAZOLE 20 MG PO CPDR: 20.0000 mg | DELAYED_RELEASE_CAPSULE | Freq: Every day | ORAL | 1 refills | Status: DC
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
## 2017-05-18 DIAGNOSIS — N401 Enlarged prostate with lower urinary tract symptoms: Secondary | ICD-10-CM | POA: Diagnosis not present

## 2017-05-18 DIAGNOSIS — R351 Nocturia: Secondary | ICD-10-CM | POA: Diagnosis not present

## 2017-07-05 ENCOUNTER — Emergency Department
Admission: EM | Admit: 2017-07-05 | Discharge: 2017-07-06 | Disposition: A | Discharge status: Home / Self Care | Payer: Medicare Other | Attending: Emergency Medicine | Admitting: Emergency Medicine

## 2017-07-05 ENCOUNTER — Emergency Department: Payer: Medicare Other

## 2017-07-05 DIAGNOSIS — M62830 Muscle spasm of back: Secondary | ICD-10-CM | POA: Diagnosis not present

## 2017-07-05 DIAGNOSIS — M545 Low back pain, unspecified: Secondary | ICD-10-CM

## 2017-07-05 DIAGNOSIS — Z7984 Long term (current) use of oral hypoglycemic drugs: Secondary | ICD-10-CM | POA: Insufficient documentation

## 2017-07-05 DIAGNOSIS — Y999 Unspecified external cause status: Secondary | ICD-10-CM | POA: Insufficient documentation

## 2017-07-05 DIAGNOSIS — S3992XA Unspecified injury of lower back, initial encounter: Secondary | ICD-10-CM | POA: Diagnosis present

## 2017-07-05 DIAGNOSIS — Z79899 Other long term (current) drug therapy: Secondary | ICD-10-CM | POA: Diagnosis not present

## 2017-07-05 DIAGNOSIS — X500XXA Overexertion from strenuous movement or load, initial encounter: Secondary | ICD-10-CM | POA: Diagnosis not present

## 2017-07-05 DIAGNOSIS — S39012A Strain of muscle, fascia and tendon of lower back, initial encounter: Secondary | ICD-10-CM | POA: Diagnosis not present

## 2017-07-05 DIAGNOSIS — Y939 Activity, unspecified: Secondary | ICD-10-CM | POA: Diagnosis not present

## 2017-07-05 DIAGNOSIS — Y929 Unspecified place or not applicable: Secondary | ICD-10-CM | POA: Insufficient documentation

## 2017-07-05 DIAGNOSIS — E119 Type 2 diabetes mellitus without complications: Secondary | ICD-10-CM | POA: Diagnosis not present

## 2017-07-05 HISTORY — DX: Calculus of kidney: N20.0
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
## 2017-07-05 NOTE — ED Triage Notes (Signed)
Patient c/o lower back pain. Patient denies injury. Patient reports that pain was so severe he has been having problems changing positions.   Patient lifted on Thursday, and drove 6 hour on Friday for a funeral.

## 2017-07-06 DIAGNOSIS — S39012A Strain of muscle, fascia and tendon of lower back, initial encounter: Secondary | ICD-10-CM | POA: Diagnosis not present

## 2017-07-06 MED ORDER — LIDOCAINE 5 % EX PTCH: 1.0000 | MEDICATED_PATCH | CUTANEOUS | 0 refills | Status: DC

## 2017-07-06 MED ORDER — LIDOCAINE 5 % EX PTCH
1.0000 | MEDICATED_PATCH | CUTANEOUS | Status: DC
  Administered 2017-07-06: 1 via TRANSDERMAL
  Filled 2017-07-06: qty 1

## 2017-07-06 MED ORDER — CYCLOBENZAPRINE HCL 5 MG PO TABS
2.5000 mg | ORAL_TABLET | Freq: Once | ORAL | Status: AC
  Administered 2017-07-06: 2.5 mg via ORAL
  Filled 2017-07-06: qty 0.5

## 2017-07-06 MED ORDER — HYDROCODONE-ACETAMINOPHEN 5-325 MG PO TABS: ORAL_TABLET | ORAL | 0 refills | Status: DC

## 2017-07-06 MED ORDER — CYCLOBENZAPRINE HCL 5 MG PO TABS: ORAL_TABLET | ORAL | 0 refills | Status: DC

## 2017-07-06 MED ORDER — KETOROLAC TROMETHAMINE 30 MG/ML IJ SOLN
15.0000 mg | Freq: Once | INTRAMUSCULAR | Status: AC
  Administered 2017-07-06: 15 mg via INTRAMUSCULAR
  Filled 2017-07-06: qty 1

## 2017-07-06 MED ORDER — HYDROCODONE-ACETAMINOPHEN 5-325 MG PO TABS
0.5000 | ORAL_TABLET | Freq: Once | ORAL | Status: AC
  Administered 2017-07-06: 0.5 via ORAL
  Filled 2017-07-06: qty 1

## 2017-07-06 NOTE — Discharge Instructions (Signed)
1.  You may continue to take ibuprofen as needed for pain. 2.  In addition, you may take Norco and Flexeril as needed for pain and muscle spasms.  Start with 1/2 tablet of each; you may advance to a whole tablet as tolerated. 3.  Apply lidocaine patch to affected area as directed. 4.  Apply moist heat to affected area several times daily to reduce pain and spasms. 5.  Return to the ER for worsening symptoms, persistent vomiting, difficulty breathing or other concerns.

## 2017-07-08 ENCOUNTER — Encounter: Payer: Self-pay | Admitting: Nurse Practitioner

## 2017-07-08 ENCOUNTER — Ambulatory Visit (INDEPENDENT_AMBULATORY_CARE_PROVIDER_SITE_OTHER): Payer: Medicare Other | Admitting: Nurse Practitioner

## 2017-07-08 VITALS — BP 130/70 | HR 91 | Temp 97.9°F | Resp 14 | Ht 67.0 in | Wt 156.5 lb

## 2017-07-08 DIAGNOSIS — G8929 Other chronic pain: Secondary | ICD-10-CM

## 2017-07-08 DIAGNOSIS — M545 Low back pain: Secondary | ICD-10-CM | POA: Diagnosis not present

## 2017-07-08 MED ORDER — CYCLOBENZAPRINE HCL 5 MG PO TABS: ORAL_TABLET | ORAL | 0 refills | Status: DC
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
## 2017-07-08 NOTE — Patient Instructions (Addendum)
- Drink plenty of water so your urine is clear or pale yellow - Continue to take medications as prescribed and use heat and start light stretching (should not cause pain) - Do not take more than 3,000mg  of acetaminophen/tylenol a day - Do not take more than 2,400mg  of advil a day ( can use but use sparingly)   Back Exercises If you have pain in your back, do these exercises 2-3 times each day or as told by your doctor. When the pain goes away, do the exercises once each day, but repeat the steps more times for each exercise (do more repetitions). If you do not have pain in your back, do these exercises once each day or as told by your doctor. Exercises Single Knee to Chest  Do these steps 3-5 times in a row for each leg: 1. Lie on your back on a firm bed or the floor with your legs stretched out. 2. Bring one knee to your chest. 3. Hold your knee to your chest by grabbing your knee or thigh. 4. Pull on your knee until you feel a gentle stretch in your lower back. 5. Keep doing the stretch for 10-30 seconds. 6. Slowly let go of your leg and straighten it.  Pelvic Tilt  Do these steps 5-10 times in a row: 1. Lie on your back on a firm bed or the floor with your legs stretched out. 2. Bend your knees so they point up to the ceiling. Your feet should be flat on the floor. 3. Tighten your lower belly (abdomen) muscles to press your lower back against the floor. This will make your tailbone point up to the ceiling instead of pointing down to your feet or the floor. 4. Stay in this position for 5-10 seconds while you gently tighten your muscles and breathe evenly.  Cat-Cow  Do these steps until your lower back bends more easily: 1. Get on your hands and knees on a firm surface. Keep your hands under your shoulders, and keep your knees under your hips. You may put padding under your knees. 2. Let your head hang down, and make your tailbone point down to the floor so your lower back is round  like the back of a cat. 3. Stay in this position for 5 seconds. 4. Slowly lift your head and make your tailbone point up to the ceiling so your back hangs low (sags) like the back of a cow. 5. Stay in this position for 5 seconds.  Press-Ups  Do these steps 5-10 times in a row: 1. Lie on your belly (face-down) on the floor. 2. Place your hands near your head, about shoulder-width apart. 3. While you keep your back relaxed and keep your hips on the floor, slowly straighten your arms to raise the top half of your body and lift your shoulders. Do not use your back muscles. To make yourself more comfortable, you may change where you place your hands. 4. Stay in this position for 5 seconds. 5. Slowly return to lying flat on the floor.  Bridges  Do these steps 10 times in a row: 1. Lie on your back on a firm surface. 2. Bend your knees so they point up to the ceiling. Your feet should be flat on the floor. 3. Tighten your butt muscles and lift your butt off of the floor until your waist is almost as high as your knees. If you do not feel the muscles working in your butt and the back  of your thighs, slide your feet 1-2 inches farther away from your butt. 4. Stay in this position for 3-5 seconds. 5. Slowly lower your butt to the floor, and let your butt muscles relax.  If this exercise is too easy, try doing it with your arms crossed over your chest. Belly Crunches  Do these steps 5-10 times in a row: 1. Lie on your back on a firm bed or the floor with your legs stretched out. 2. Bend your knees so they point up to the ceiling. Your feet should be flat on the floor. 3. Cross your arms over your chest. 4. Tip your chin a little bit toward your chest but do not bend your neck. 5. Tighten your belly muscles and slowly raise your chest just enough to lift your shoulder blades a tiny bit off of the floor. 6. Slowly lower your chest and your head to the floor.  Back Lifts Do these steps 5-10  times in a row: 1. Lie on your belly (face-down) with your arms at your sides, and rest your forehead on the floor. 2. Tighten the muscles in your legs and your butt. 3. Slowly lift your chest off of the floor while you keep your hips on the floor. Keep the back of your head in line with the curve in your back. Look at the floor while you do this. 4. Stay in this position for 3-5 seconds. 5. Slowly lower your chest and your face to the floor.  Contact a doctor if:  Your back pain gets a lot worse when you do an exercise.  Your back pain does not lessen 2 hours after you exercise. If you have any of these problems, stop doing the exercises. Do not do them again unless your doctor says it is okay. Get help right away if:  You have sudden, very bad back pain. If this happens, stop doing the exercises. Do not do them again unless your doctor says it is okay. This information is not intended to replace advice given to you by your health care provider. Make sure you discuss any questions you have with your health care provider. Document Released: 02/08/2010 Document Revised: 06/14/2015 Document Reviewed: 03/02/2014 Elsevier Interactive Patient Education  [employer].

## 2017-07-14 ENCOUNTER — Telehealth: Payer: Self-pay | Admitting: Family Medicine

## 2017-07-14 NOTE — Telephone Encounter (Signed)
Copied from [facility] [id number]. Topic: Quick Communication - See Telephone Encounter >> Jul 14, 2017 12:24 PM Hewitt Shorts wrote: Pt calling stating that the hydrocodone and a flexeril generic and the lidocaine patch for his back issues has helped some but pt was told if not better provider will call him in a steriod  Best number [phone]  [facility]

## 2017-07-15 ENCOUNTER — Other Ambulatory Visit: Payer: Self-pay | Admitting: Nurse Practitioner

## 2017-07-15 DIAGNOSIS — M545 Low back pain: Principal | ICD-10-CM

## 2017-07-15 DIAGNOSIS — G8929 Other chronic pain: Secondary | ICD-10-CM

## 2017-07-15 MED ORDER — PREDNISONE 10 MG (21) PO TBPK: ORAL_TABLET | ORAL | 0 refills | Status: DC

## 2017-07-15 NOTE — Telephone Encounter (Signed)
Please inform patient [facility]. Please make sure he is eating healthy foods, and drinking lots of water as this will increase his blood sugar as well. If pain is still uncontrolled he can use [facility] walk in clinic, or I can refer him to Pain management, Ortho or PT

## 2017-07-16 NOTE — Telephone Encounter (Signed)
Patient wife notified.

## 2017-08-24 DIAGNOSIS — H40053 Ocular hypertension, bilateral: Secondary | ICD-10-CM | POA: Diagnosis not present

## 2017-08-31 ENCOUNTER — Encounter: Payer: Self-pay | Admitting: Family Medicine

## 2017-08-31 ENCOUNTER — Ambulatory Visit (INDEPENDENT_AMBULATORY_CARE_PROVIDER_SITE_OTHER): Payer: Medicare Other | Admitting: Family Medicine

## 2017-08-31 VITALS — BP 108/70 | HR 68 | Temp 97.4°F | Resp 16 | Ht 67.0 in | Wt 150.0 lb

## 2017-08-31 DIAGNOSIS — Z23 Encounter for immunization: Secondary | ICD-10-CM

## 2017-08-31 DIAGNOSIS — M160 Bilateral primary osteoarthritis of hip: Secondary | ICD-10-CM | POA: Diagnosis not present

## 2017-08-31 DIAGNOSIS — M545 Low back pain: Secondary | ICD-10-CM | POA: Diagnosis not present

## 2017-08-31 DIAGNOSIS — K219 Gastro-esophageal reflux disease without esophagitis: Secondary | ICD-10-CM

## 2017-08-31 DIAGNOSIS — E785 Hyperlipidemia, unspecified: Secondary | ICD-10-CM | POA: Diagnosis not present

## 2017-08-31 DIAGNOSIS — Z862 Personal history of diseases of the blood and blood-forming organs and certain disorders involving the immune mechanism: Secondary | ICD-10-CM

## 2017-08-31 DIAGNOSIS — Z79899 Other long term (current) drug therapy: Secondary | ICD-10-CM

## 2017-08-31 DIAGNOSIS — R634 Abnormal weight loss: Secondary | ICD-10-CM | POA: Diagnosis not present

## 2017-08-31 DIAGNOSIS — E119 Type 2 diabetes mellitus without complications: Secondary | ICD-10-CM | POA: Diagnosis not present

## 2017-08-31 DIAGNOSIS — G8929 Other chronic pain: Secondary | ICD-10-CM

## 2017-08-31 DIAGNOSIS — E1169 Type 2 diabetes mellitus with other specified complication: Secondary | ICD-10-CM | POA: Diagnosis not present

## 2017-08-31 DIAGNOSIS — D649 Anemia, unspecified: Secondary | ICD-10-CM | POA: Diagnosis not present

## 2017-08-31 LAB — POCT GLYCOSYLATED HEMOGLOBIN (HGB A1C): HEMOGLOBIN A1C: 6.2 % — AB (ref 4.0–5.6)

## 2017-08-31 MED ORDER — ATORVASTATIN CALCIUM 10 MG PO TABS: 10.0000 mg | ORAL_TABLET | Freq: Every day | ORAL | 1 refills | Status: DC

## 2017-08-31 MED ORDER — OMEPRAZOLE 20 MG PO CPDR: 20.0000 mg | DELAYED_RELEASE_CAPSULE | Freq: Every day | ORAL | 1 refills | Status: DC

## 2017-08-31 MED ORDER — METFORMIN HCL 500 MG PO TABS: 500.0000 mg | ORAL_TABLET | Freq: Two times a day (BID) | ORAL | 1 refills | Status: DC
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
## 2017-09-03 LAB — COMPLETE METABOLIC PANEL WITH GFR
AG Ratio: 1.8 (calc) (ref 1.0–2.5)
ALT: 16 U/L (ref 9–46)
AST: 20 U/L (ref 10–35)
Albumin: 4.5 g/dL (ref 3.6–5.1)
Alkaline phosphatase (APISO): 89 U/L (ref 40–115)
BILIRUBIN TOTAL: 0.7 mg/dL (ref 0.2–1.2)
BUN: 20 mg/dL (ref 7–25)
CHLORIDE: 103 mmol/L (ref 98–110)
CO2: 29 mmol/L (ref 20–32)
CREATININE: 0.82 mg/dL (ref 0.70–1.18)
Calcium: 9.7 mg/dL (ref 8.6–10.3)
GFR, EST AFRICAN AMERICAN: 100 mL/min/{1.73_m2} (ref >=60)
GFR, Est Non African American: 86 mL/min/{1.73_m2} (ref >=60)
GLUCOSE: 130 mg/dL — AB (ref 65–99)
Globulin: 2.5 g/dL (calc) (ref 1.9–3.7)
Potassium: 4.4 mmol/L (ref 3.5–5.3)
Sodium: 139 mmol/L (ref 135–146)
TOTAL PROTEIN: 7 g/dL (ref 6.1–8.1)

## 2017-09-03 LAB — CBC WITH DIFFERENTIAL/PLATELET
Basophils Absolute: 51 cells/uL (ref 0–200)
Basophils Relative: 0.8 %
Eosinophils Absolute: 250 cells/uL (ref 15–500)
Eosinophils Relative: 3.9 %
HCT: 36.7 % — ABNORMAL LOW (ref 38.5–50.0)
Hemoglobin: 12.6 g/dL — ABNORMAL LOW (ref 13.2–17.1)
Lymphs Abs: 1075 cells/uL (ref 850–3900)
MCH: 31.4 pg (ref 27.0–33.0)
MCHC: 34.3 g/dL (ref 32.0–36.0)
MCV: 91.5 fL (ref 80.0–100.0)
MPV: 9.9 fL (ref 7.5–12.5)
Monocytes Relative: 8.6 %
Neutro Abs: 4474 cells/uL (ref 1500–7800)
Neutrophils Relative %: 69.9 %
PLATELETS: 219 10*3/uL (ref 140–400)
RBC: 4.01 10*6/uL — ABNORMAL LOW (ref 4.20–5.80)
RDW: 12.8 % (ref 11.0–15.0)
TOTAL LYMPHOCYTE: 16.8 %
WBC: 6.4 10*3/uL (ref 3.8–10.8)
WBCMIX: 550 {cells}/uL (ref 200–950)

## 2017-09-03 LAB — LIPID PANEL
CHOL/HDL RATIO: 2.2 (calc) (ref <5.0)
Cholesterol: 130 mg/dL (ref <200)
HDL: 60 mg/dL (ref >40)
LDL CHOLESTEROL (CALC): 56 mg/dL
Non-HDL Cholesterol (Calc): 70 mg/dL (calc) (ref <130)
TRIGLYCERIDES: 63 mg/dL (ref <150)

## 2017-09-03 LAB — IRON,TIBC AND FERRITIN PANEL
%SAT: 27 % (ref 20–48)
FERRITIN: 192 ng/mL (ref 24–380)
IRON: 89 ug/dL (ref 50–180)
TIBC: 335 ug/dL (ref 250–425)

## 2017-09-03 LAB — TEST AUTHORIZATION

## 2018-01-04 ENCOUNTER — Encounter: Payer: Self-pay | Admitting: Family Medicine

## 2018-01-04 ENCOUNTER — Ambulatory Visit (INDEPENDENT_AMBULATORY_CARE_PROVIDER_SITE_OTHER): Payer: Medicare Other | Admitting: Family Medicine

## 2018-01-04 VITALS — BP 108/68 | HR 85 | Temp 98.5°F | Resp 16 | Ht 67.0 in | Wt 158.9 lb

## 2018-01-04 DIAGNOSIS — Z23 Encounter for immunization: Secondary | ICD-10-CM

## 2018-01-04 DIAGNOSIS — Z862 Personal history of diseases of the blood and blood-forming organs and certain disorders involving the immune mechanism: Secondary | ICD-10-CM | POA: Diagnosis not present

## 2018-01-04 DIAGNOSIS — Z87828 Personal history of other (healed) physical injury and trauma: Secondary | ICD-10-CM | POA: Diagnosis not present

## 2018-01-04 DIAGNOSIS — G8929 Other chronic pain: Secondary | ICD-10-CM

## 2018-01-04 DIAGNOSIS — R21 Rash and other nonspecific skin eruption: Secondary | ICD-10-CM

## 2018-01-04 DIAGNOSIS — E119 Type 2 diabetes mellitus without complications: Secondary | ICD-10-CM

## 2018-01-04 DIAGNOSIS — K219 Gastro-esophageal reflux disease without esophagitis: Secondary | ICD-10-CM

## 2018-01-04 DIAGNOSIS — M160 Bilateral primary osteoarthritis of hip: Secondary | ICD-10-CM | POA: Diagnosis not present

## 2018-01-04 DIAGNOSIS — E785 Hyperlipidemia, unspecified: Secondary | ICD-10-CM

## 2018-01-04 DIAGNOSIS — M545 Low back pain: Secondary | ICD-10-CM

## 2018-01-04 DIAGNOSIS — L570 Actinic keratosis: Secondary | ICD-10-CM | POA: Diagnosis not present

## 2018-01-04 DIAGNOSIS — X32XXXA Exposure to sunlight, initial encounter: Secondary | ICD-10-CM

## 2018-01-04 DIAGNOSIS — D649 Anemia, unspecified: Secondary | ICD-10-CM | POA: Diagnosis not present

## 2018-01-04 DIAGNOSIS — E1169 Type 2 diabetes mellitus with other specified complication: Secondary | ICD-10-CM | POA: Diagnosis not present

## 2018-01-04 LAB — POCT GLYCOSYLATED HEMOGLOBIN (HGB A1C): HbA1c, POC (controlled diabetic range): 6.4 % (ref 0.0–7.0)

## 2018-01-04 MED ORDER — METFORMIN HCL 500 MG PO TABS: 500.0000 mg | ORAL_TABLET | Freq: Two times a day (BID) | ORAL | 1 refills | Status: DC

## 2018-01-04 MED ORDER — ATORVASTATIN CALCIUM 10 MG PO TABS: 10.0000 mg | ORAL_TABLET | Freq: Every day | ORAL | 1 refills | Status: DC

## 2018-01-04 MED ORDER — OMEPRAZOLE 20 MG PO CPDR: 20.0000 mg | DELAYED_RELEASE_CAPSULE | Freq: Every day | ORAL | 1 refills | Status: DC

## 2018-01-04 MED ORDER — TRIAMCINOLONE ACETONIDE 0.1 % EX CREA: 1.0000 "application " | TOPICAL_CREAM | Freq: Two times a day (BID) | CUTANEOUS | 0 refills | Status: DC
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
## 2018-03-01 DIAGNOSIS — E119 Type 2 diabetes mellitus without complications: Secondary | ICD-10-CM | POA: Diagnosis not present

## 2018-04-14 ENCOUNTER — Other Ambulatory Visit: Payer: Self-pay

## 2018-04-14 DIAGNOSIS — E119 Type 2 diabetes mellitus without complications: Secondary | ICD-10-CM

## 2018-04-14 MED ORDER — GLUCOSE BLOOD VI STRP: ORAL_STRIP | 3 refills | Status: DC

## 2018-04-14 NOTE — Telephone Encounter (Signed)
Refill request for general medication. One Touch Ultra Blue Test Strip   Last office visit 01/04/2018   Follow up on 07/19/2018

## 2018-04-26 ENCOUNTER — Telehealth: Payer: Self-pay | Admitting: Family Medicine

## 2018-04-26 NOTE — Telephone Encounter (Signed)
Copied from [place] [id number]. Topic: General - Inquiry >> Apr 26, 2018  8:13 AM Margot Ables wrote: Reason for CRM: pt called stating he is having pain in right foot and up leg a bit. He said that he believes it is another gout attack. He said it happens about 1/year. Pt was prescribed prednisone last June for gout. He is requesting again. Please advise.  [id number] - [place], [place] - [address] [phone] (Phone) [phone] (Fax)

## 2018-04-27 ENCOUNTER — Encounter: Payer: Self-pay | Admitting: Family Medicine

## 2018-04-27 ENCOUNTER — Ambulatory Visit (INDEPENDENT_AMBULATORY_CARE_PROVIDER_SITE_OTHER): Payer: Medicare Other | Admitting: Family Medicine

## 2018-04-27 ENCOUNTER — Other Ambulatory Visit: Payer: Self-pay

## 2018-04-27 VITALS — Ht 67.0 in | Wt 152.0 lb

## 2018-04-27 DIAGNOSIS — M79671 Pain in right foot: Secondary | ICD-10-CM | POA: Diagnosis not present

## 2018-04-27 MED ORDER — DICLOFENAC SODIUM 75 MG PO TBEC: 75.0000 mg | DELAYED_RELEASE_TABLET | Freq: Two times a day (BID) | ORAL | 0 refills | Status: DC
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
## 2018-05-03 DIAGNOSIS — M25561 Pain in right knee: Secondary | ICD-10-CM | POA: Diagnosis not present

## 2018-05-03 DIAGNOSIS — M2391 Unspecified internal derangement of right knee: Secondary | ICD-10-CM | POA: Diagnosis not present

## 2018-05-26 ENCOUNTER — Other Ambulatory Visit: Payer: Self-pay

## 2018-05-26 MED ORDER — ONETOUCH DELICA PLUS LANCET33G MISC: 100.0000 | Freq: Every day | 1 refills | Status: DC

## 2018-05-26 NOTE — Telephone Encounter (Signed)
Refill request for general medication. One Touch Delica Plus 33 G  Last office visit 04/27/2018   Follow up on 07/19/2018

## 2018-06-18 ENCOUNTER — Ambulatory Visit: Payer: Self-pay | Admitting: Family Medicine

## 2018-06-18 NOTE — Chronic Care Management (AMB) (Signed)
Chronic Care Management   Note  06/18/2018 Name: Billy Franco MRN: [id number] DOB: [birth year]  Billy Franco is a 78 y.o. year old male who is a primary care patient of Steele Sizer, MD. I reached out to [name] by phone today in response to a referral sent by Billy Franco's health plan.    [name] was given information about Chronic Care Management services today including:  1. CCM service includes personalized support from designated clinical staff supervised by his physician, including individualized plan of care and coordination with other care providers 2. 24/7 contact phone numbers for assistance for urgent and routine care needs. 3. Service will only be billed when office clinical staff spend 20 minutes or more in a month to coordinate care. 4. Only one practitioner may furnish and bill the service in a calendar month. 5. The patient may stop CCM services at any time (effective at the end of the month) by phone call to the office staff. 6. The patient will be responsible for cost sharing (co-pay) of up to 20% of the service fee (after annual deductible is met).  Patient did not agree to enrollment in care management services and does not wish to consider at this time.  Follow up plan: The patient has been provided with contact information for the chronic care management team and has been advised to call with any health related questions or concerns.   [place]  [email or web address]   ??[phone]

## 2018-06-21 DIAGNOSIS — E119 Type 2 diabetes mellitus without complications: Secondary | ICD-10-CM | POA: Diagnosis not present

## 2018-06-21 LAB — HM DIABETES EYE EXAM

## 2018-07-01 ENCOUNTER — Other Ambulatory Visit: Payer: Self-pay | Admitting: Family Medicine

## 2018-07-01 DIAGNOSIS — K219 Gastro-esophageal reflux disease without esophagitis: Secondary | ICD-10-CM

## 2018-07-11 ENCOUNTER — Other Ambulatory Visit: Payer: Self-pay | Admitting: Family Medicine

## 2018-07-11 DIAGNOSIS — E1169 Type 2 diabetes mellitus with other specified complication: Secondary | ICD-10-CM

## 2018-07-19 ENCOUNTER — Encounter: Payer: Self-pay | Admitting: Family Medicine

## 2018-07-19 ENCOUNTER — Other Ambulatory Visit: Payer: Self-pay

## 2018-07-19 ENCOUNTER — Ambulatory Visit (INDEPENDENT_AMBULATORY_CARE_PROVIDER_SITE_OTHER): Payer: Medicare Other | Admitting: Family Medicine

## 2018-07-19 VITALS — BP 122/66 | HR 88 | Temp 96.2°F | Resp 16 | Ht 67.0 in | Wt 152.9 lb

## 2018-07-19 DIAGNOSIS — E119 Type 2 diabetes mellitus without complications: Secondary | ICD-10-CM | POA: Diagnosis not present

## 2018-07-19 DIAGNOSIS — E785 Hyperlipidemia, unspecified: Secondary | ICD-10-CM | POA: Diagnosis not present

## 2018-07-19 DIAGNOSIS — L247 Irritant contact dermatitis due to plants, except food: Secondary | ICD-10-CM | POA: Diagnosis not present

## 2018-07-19 DIAGNOSIS — Z79899 Other long term (current) drug therapy: Secondary | ICD-10-CM

## 2018-07-19 DIAGNOSIS — E1169 Type 2 diabetes mellitus with other specified complication: Secondary | ICD-10-CM

## 2018-07-19 DIAGNOSIS — K219 Gastro-esophageal reflux disease without esophagitis: Secondary | ICD-10-CM | POA: Diagnosis not present

## 2018-07-19 DIAGNOSIS — Z8739 Personal history of other diseases of the musculoskeletal system and connective tissue: Secondary | ICD-10-CM

## 2018-07-19 DIAGNOSIS — D649 Anemia, unspecified: Secondary | ICD-10-CM

## 2018-07-19 LAB — POCT GLYCOSYLATED HEMOGLOBIN (HGB A1C): Hemoglobin A1C: 6.3 % — AB (ref 4.0–5.6)

## 2018-07-19 MED ORDER — OMEPRAZOLE 20 MG PO CPDR: 20.0000 mg | DELAYED_RELEASE_CAPSULE | ORAL | 0 refills | Status: DC

## 2018-07-19 MED ORDER — METFORMIN HCL 500 MG PO TABS: 500.0000 mg | ORAL_TABLET | Freq: Two times a day (BID) | ORAL | 1 refills | Status: DC

## 2018-07-19 MED ORDER — ATORVASTATIN CALCIUM 10 MG PO TABS: 10.0000 mg | ORAL_TABLET | Freq: Every day | ORAL | 1 refills | Status: DC

## 2018-07-19 MED ORDER — PREDNISONE 10 MG (21) PO TBPK: ORAL_TABLET | ORAL | 0 refills | Status: DC
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
## 2018-07-23 LAB — COMPLETE METABOLIC PANEL WITH GFR
AG Ratio: 1.9 (calc) (ref 1.0–2.5)
ALT: 19 U/L (ref 9–46)
AST: 22 U/L (ref 10–35)
Albumin: 4.5 g/dL (ref 3.6–5.1)
Alkaline phosphatase (APISO): 89 U/L (ref 35–144)
BUN/Creatinine Ratio: 31 (calc) — ABNORMAL HIGH (ref 6–22)
BUN: 30 mg/dL — ABNORMAL HIGH (ref 7–25)
CO2: 30 mmol/L (ref 20–32)
Calcium: 10.2 mg/dL (ref 8.6–10.3)
Chloride: 107 mmol/L (ref 98–110)
Creat: 0.96 mg/dL (ref 0.70–1.18)
GFR, Est African American: 88 mL/min/{1.73_m2} (ref >=60)
GFR, Est Non African American: 76 mL/min/{1.73_m2} (ref >=60)
Globulin: 2.4 g/dL (calc) (ref 1.9–3.7)
Glucose, Bld: 139 mg/dL — ABNORMAL HIGH (ref 65–99)
Potassium: 5.1 mmol/L (ref 3.5–5.3)
Sodium: 141 mmol/L (ref 135–146)
Total Bilirubin: 0.8 mg/dL (ref 0.2–1.2)
Total Protein: 6.9 g/dL (ref 6.1–8.1)

## 2018-07-23 LAB — METHYLMALONIC ACID, SERUM: Methylmalonic Acid, Quant: 96 nmol/L (ref 87–318)

## 2018-07-23 LAB — CBC WITH DIFFERENTIAL/PLATELET
Absolute Monocytes: 780 cells/uL (ref 200–950)
Basophils Absolute: 58 cells/uL (ref 0–200)
Basophils Relative: 0.7 %
Eosinophils Absolute: 324 cells/uL (ref 15–500)
Eosinophils Relative: 3.9 %
HCT: 38.7 % (ref 38.5–50.0)
Hemoglobin: 12.8 g/dL — ABNORMAL LOW (ref 13.2–17.1)
Lymphs Abs: 1428 cells/uL (ref 850–3900)
MCH: 31.4 pg (ref 27.0–33.0)
MCHC: 33.1 g/dL (ref 32.0–36.0)
MCV: 94.9 fL (ref 80.0–100.0)
MPV: 10.2 fL (ref 7.5–12.5)
Monocytes Relative: 9.4 %
Neutro Abs: 5710 cells/uL (ref 1500–7800)
Neutrophils Relative %: 68.8 %
Platelets: 224 10*3/uL (ref 140–400)
RBC: 4.08 10*6/uL — ABNORMAL LOW (ref 4.20–5.80)
RDW: 12.7 % (ref 11.0–15.0)
Total Lymphocyte: 17.2 %
WBC: 8.3 10*3/uL (ref 3.8–10.8)

## 2018-07-23 LAB — LIPID PANEL
Cholesterol: 120 mg/dL (ref <200)
HDL: 56 mg/dL (ref >=40)
LDL Cholesterol (Calc): 50 mg/dL (calc)
Non-HDL Cholesterol (Calc): 64 mg/dL (calc) (ref <130)
Total CHOL/HDL Ratio: 2.1 (calc) (ref <5.0)
Triglycerides: 68 mg/dL (ref <150)

## 2018-07-23 LAB — IRON,TIBC AND FERRITIN PANEL
%SAT: 33 % (calc) (ref 20–48)
Ferritin: 214 ng/mL (ref 24–380)
Iron: 107 ug/dL (ref 50–180)
TIBC: 329 mcg/dL (calc) (ref 250–425)

## 2018-07-23 LAB — MICROALBUMIN / CREATININE URINE RATIO
Creatinine, Urine: 119 mg/dL (ref 20–320)
Microalb Creat Ratio: 15 mcg/mg creat (ref <30)
Microalb, Ur: 1.8 mg/dL

## 2018-07-23 LAB — URIC ACID: Uric Acid, Serum: 4.8 mg/dL (ref 4.0–8.0)

## 2018-08-02 DIAGNOSIS — Z125 Encounter for screening for malignant neoplasm of prostate: Secondary | ICD-10-CM | POA: Diagnosis not present

## 2018-08-02 DIAGNOSIS — N5201 Erectile dysfunction due to arterial insufficiency: Secondary | ICD-10-CM | POA: Diagnosis not present

## 2018-08-02 DIAGNOSIS — N401 Enlarged prostate with lower urinary tract symptoms: Secondary | ICD-10-CM | POA: Diagnosis not present

## 2018-11-27 ENCOUNTER — Other Ambulatory Visit: Payer: Self-pay | Admitting: Family Medicine

## 2019-01-06 ENCOUNTER — Other Ambulatory Visit: Payer: Self-pay | Admitting: Family Medicine

## 2019-01-06 DIAGNOSIS — K219 Gastro-esophageal reflux disease without esophagitis: Secondary | ICD-10-CM

## 2019-01-24 ENCOUNTER — Other Ambulatory Visit: Payer: Self-pay

## 2019-01-24 ENCOUNTER — Ambulatory Visit (INDEPENDENT_AMBULATORY_CARE_PROVIDER_SITE_OTHER): Payer: Medicare Other | Admitting: Family Medicine

## 2019-01-24 ENCOUNTER — Encounter: Payer: Self-pay | Admitting: Family Medicine

## 2019-01-24 VITALS — BP 130/72 | HR 74 | Temp 97.1°F | Resp 16 | Ht 67.0 in | Wt 163.0 lb

## 2019-01-24 DIAGNOSIS — Z23 Encounter for immunization: Secondary | ICD-10-CM | POA: Diagnosis not present

## 2019-01-24 DIAGNOSIS — E785 Hyperlipidemia, unspecified: Secondary | ICD-10-CM

## 2019-01-24 DIAGNOSIS — M160 Bilateral primary osteoarthritis of hip: Secondary | ICD-10-CM

## 2019-01-24 DIAGNOSIS — D649 Anemia, unspecified: Secondary | ICD-10-CM | POA: Diagnosis not present

## 2019-01-24 DIAGNOSIS — E119 Type 2 diabetes mellitus without complications: Secondary | ICD-10-CM

## 2019-01-24 DIAGNOSIS — M47816 Spondylosis without myelopathy or radiculopathy, lumbar region: Secondary | ICD-10-CM

## 2019-01-24 DIAGNOSIS — G8929 Other chronic pain: Secondary | ICD-10-CM

## 2019-01-24 DIAGNOSIS — E1169 Type 2 diabetes mellitus with other specified complication: Secondary | ICD-10-CM | POA: Diagnosis not present

## 2019-01-24 DIAGNOSIS — M545 Low back pain: Secondary | ICD-10-CM

## 2019-01-24 LAB — POCT GLYCOSYLATED HEMOGLOBIN (HGB A1C): Hemoglobin A1C: 6.5 % — AB (ref 4.0–5.6)

## 2019-01-24 MED ORDER — METFORMIN HCL 500 MG PO TABS: 500.0000 mg | ORAL_TABLET | Freq: Every day | ORAL | 1 refills | Status: DC

## 2019-01-24 MED ORDER — CELECOXIB 200 MG PO CAPS: 200.0000 mg | ORAL_CAPSULE | Freq: Every day | ORAL | 1 refills | Status: DC

## 2019-01-24 MED ORDER — GLUCOSE BLOOD VI STRP: ORAL_STRIP | 3 refills | Status: DC

## 2019-01-24 MED ORDER — ATORVASTATIN CALCIUM 10 MG PO TABS: 10.0000 mg | ORAL_TABLET | Freq: Every day | ORAL | 1 refills | Status: DC
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
## 2019-03-03 ENCOUNTER — Other Ambulatory Visit: Payer: Self-pay | Admitting: Family Medicine

## 2019-03-03 DIAGNOSIS — E1169 Type 2 diabetes mellitus with other specified complication: Secondary | ICD-10-CM

## 2019-03-14 ENCOUNTER — Encounter: Payer: Self-pay | Admitting: Family Medicine

## 2019-03-14 DIAGNOSIS — H2513 Age-related nuclear cataract, bilateral: Secondary | ICD-10-CM | POA: Diagnosis not present

## 2019-03-14 LAB — HM DIABETES EYE EXAM

## 2019-04-29 ENCOUNTER — Other Ambulatory Visit: Payer: Self-pay

## 2019-04-29 ENCOUNTER — Emergency Department
Admission: EM | Admit: 2019-04-29 | Discharge: 2019-04-29 | Disposition: A | Discharge status: Home / Self Care | Payer: Medicare Other | Attending: Emergency Medicine | Admitting: Emergency Medicine

## 2019-04-29 ENCOUNTER — Encounter: Payer: Self-pay | Admitting: Emergency Medicine

## 2019-04-29 DIAGNOSIS — R531 Weakness: Secondary | ICD-10-CM | POA: Diagnosis not present

## 2019-04-29 DIAGNOSIS — Z5321 Procedure and treatment not carried out due to patient leaving prior to being seen by health care provider: Secondary | ICD-10-CM | POA: Diagnosis not present

## 2019-04-29 LAB — BASIC METABOLIC PANEL
Anion gap: 9 (ref 5–15)
BUN: 23 mg/dL (ref 8–23)
CO2: 27 mmol/L (ref 22–32)
Calcium: 9.8 mg/dL (ref 8.9–10.3)
Chloride: 108 mmol/L (ref 98–111)
Creatinine, Ser: 1.09 mg/dL (ref 0.61–1.24)
GFR calc Af Amer: >60 mL/min (ref >60)
GFR calc non Af Amer: >60 mL/min (ref >60)
Glucose, Bld: 126 mg/dL — ABNORMAL HIGH (ref 70–99)
Potassium: 4.6 mmol/L (ref 3.5–5.1)
Sodium: 144 mmol/L (ref 135–145)

## 2019-04-29 LAB — CBC
HCT: 38 % — ABNORMAL LOW (ref 39.0–52.0)
Hemoglobin: 12.3 g/dL — ABNORMAL LOW (ref 13.0–17.0)
MCH: 31 pg (ref 26.0–34.0)
MCHC: 32.4 g/dL (ref 30.0–36.0)
MCV: 95.7 fL (ref 80.0–100.0)
Platelets: 194 10*3/uL (ref 150–400)
RBC: 3.97 MIL/uL — ABNORMAL LOW (ref 4.22–5.81)
RDW: 13.4 % (ref 11.5–15.5)
WBC: 7.5 10*3/uL (ref 4.0–10.5)
nRBC: 0 % (ref 0.0–0.2)

## 2019-04-29 MED ORDER — SODIUM CHLORIDE 0.9% FLUSH: 3.0000 mL | Freq: Once | INTRAVENOUS | Status: DC

## 2019-04-29 NOTE — ED Triage Notes (Signed)
Pt to ED via POV from [facility] clinic for being off balance x 2 months and having back pain x years. Pt states that he has fallen 4 times in the last 4-5 days. Pt is A & O.

## 2019-05-02 ENCOUNTER — Encounter: Payer: Self-pay | Admitting: Emergency Medicine

## 2019-05-02 ENCOUNTER — Emergency Department
Admission: EM | Admit: 2019-05-02 | Discharge: 2019-05-02 | Disposition: A | Discharge status: Home / Self Care | Payer: Medicare Other | Attending: Emergency Medicine | Admitting: Emergency Medicine

## 2019-05-02 ENCOUNTER — Emergency Department: Payer: Medicare Other

## 2019-05-02 ENCOUNTER — Other Ambulatory Visit: Payer: Self-pay

## 2019-05-02 DIAGNOSIS — R109 Unspecified abdominal pain: Secondary | ICD-10-CM | POA: Diagnosis not present

## 2019-05-02 DIAGNOSIS — Z7984 Long term (current) use of oral hypoglycemic drugs: Secondary | ICD-10-CM | POA: Diagnosis not present

## 2019-05-02 DIAGNOSIS — R42 Dizziness and giddiness: Secondary | ICD-10-CM | POA: Insufficient documentation

## 2019-05-02 DIAGNOSIS — E119 Type 2 diabetes mellitus without complications: Secondary | ICD-10-CM | POA: Diagnosis not present

## 2019-05-02 DIAGNOSIS — Z79899 Other long term (current) drug therapy: Secondary | ICD-10-CM | POA: Insufficient documentation

## 2019-05-02 DIAGNOSIS — M25559 Pain in unspecified hip: Secondary | ICD-10-CM | POA: Insufficient documentation

## 2019-05-02 DIAGNOSIS — N390 Urinary tract infection, site not specified: Secondary | ICD-10-CM | POA: Diagnosis not present

## 2019-05-02 DIAGNOSIS — R296 Repeated falls: Secondary | ICD-10-CM | POA: Diagnosis not present

## 2019-05-02 DIAGNOSIS — M545 Low back pain: Secondary | ICD-10-CM | POA: Diagnosis not present

## 2019-05-02 DIAGNOSIS — N200 Calculus of kidney: Secondary | ICD-10-CM | POA: Diagnosis not present

## 2019-05-02 LAB — BASIC METABOLIC PANEL
Anion gap: 7 (ref 5–15)
BUN: 21 mg/dL (ref 8–23)
CO2: 26 mmol/L (ref 22–32)
Calcium: 9.1 mg/dL (ref 8.9–10.3)
Chloride: 105 mmol/L (ref 98–111)
Creatinine, Ser: 1.04 mg/dL (ref 0.61–1.24)
GFR calc Af Amer: >60 mL/min (ref >60)
GFR calc non Af Amer: >60 mL/min (ref >60)
Glucose, Bld: 152 mg/dL — ABNORMAL HIGH (ref 70–99)
Potassium: 5.4 mmol/L — ABNORMAL HIGH (ref 3.5–5.1)
Sodium: 138 mmol/L (ref 135–145)

## 2019-05-02 LAB — URINALYSIS, COMPLETE (UACMP) WITH MICROSCOPIC
Bacteria, UA: NONE SEEN
Bilirubin Urine: NEGATIVE
Glucose, UA: NEGATIVE mg/dL
Ketones, ur: NEGATIVE mg/dL
Nitrite: POSITIVE — AB
Protein, ur: NEGATIVE mg/dL
Specific Gravity, Urine: 1.016 (ref 1.005–1.030)
Squamous Epithelial / HPF: NONE SEEN (ref 0–5)
pH: 5 (ref 5.0–8.0)

## 2019-05-02 LAB — CBC
HCT: 39.8 % (ref 39.0–52.0)
Hemoglobin: 12.8 g/dL — ABNORMAL LOW (ref 13.0–17.0)
MCH: 30.8 pg (ref 26.0–34.0)
MCHC: 32.2 g/dL (ref 30.0–36.0)
MCV: 95.7 fL (ref 80.0–100.0)
Platelets: 187 10*3/uL (ref 150–400)
RBC: 4.16 MIL/uL — ABNORMAL LOW (ref 4.22–5.81)
RDW: 13.4 % (ref 11.5–15.5)
WBC: 8.8 10*3/uL (ref 4.0–10.5)
nRBC: 0 % (ref 0.0–0.2)

## 2019-05-02 LAB — POTASSIUM: Potassium: 4.5 mmol/L (ref 3.5–5.1)

## 2019-05-02 MED ORDER — SODIUM CHLORIDE 0.9 % IV SOLN
1.0000 g | Freq: Once | INTRAVENOUS | Status: AC
  Administered 2019-05-02: 1 g via INTRAVENOUS
  Filled 2019-05-02: qty 10

## 2019-05-02 MED ORDER — TRAMADOL HCL 50 MG PO TABS: 50.0000 mg | ORAL_TABLET | Freq: Four times a day (QID) | ORAL | 0 refills | Status: DC | PRN

## 2019-05-02 MED ORDER — SODIUM CHLORIDE 0.9 % IV BOLUS
1000.0000 mL | Freq: Once | INTRAVENOUS | Status: AC
  Administered 2019-05-02: 10:00:00 1000 mL via INTRAVENOUS

## 2019-05-02 MED ORDER — FENTANYL CITRATE (PF) 100 MCG/2ML IJ SOLN
50.0000 ug | Freq: Once | INTRAMUSCULAR | Status: AC
  Administered 2019-05-02: 13:00:00 50 ug via INTRAVENOUS
  Filled 2019-05-02: qty 2

## 2019-05-02 MED ORDER — CEPHALEXIN 500 MG PO CAPS: 500.0000 mg | ORAL_CAPSULE | Freq: Three times a day (TID) | ORAL | 0 refills | Status: DC
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
## 2019-05-02 NOTE — ED Triage Notes (Signed)
Pt reports has had back problems for awhile and it continues to hurt. Pt reports pain is to his lower to mid back. Pt also reports for the past couple of months he has been getting dizzy spells and has difficulty keeping his balance. Pt states he fell Friday and skinned himself up from the dizziness.

## 2019-05-02 NOTE — ED Notes (Signed)
Pt given pain medication per MD order. IV abx hung. Pt comfortable in chair, wife bedside.

## 2019-05-02 NOTE — ED Notes (Signed)
Fluids finished, blood drawn and sent to lab. Pt up in chair at this time.

## 2019-07-07 ENCOUNTER — Other Ambulatory Visit: Payer: Self-pay | Admitting: Family Medicine

## 2019-07-07 DIAGNOSIS — K219 Gastro-esophageal reflux disease without esophagitis: Secondary | ICD-10-CM

## 2019-07-25 ENCOUNTER — Other Ambulatory Visit: Payer: Self-pay | Admitting: Family Medicine

## 2019-08-01 ENCOUNTER — Ambulatory Visit: Payer: Medicare Other | Admitting: Family Medicine

## 2019-08-02 DIAGNOSIS — Z125 Encounter for screening for malignant neoplasm of prostate: Secondary | ICD-10-CM | POA: Diagnosis not present

## 2019-08-02 DIAGNOSIS — N401 Enlarged prostate with lower urinary tract symptoms: Secondary | ICD-10-CM | POA: Diagnosis not present

## 2019-08-08 ENCOUNTER — Other Ambulatory Visit: Payer: Self-pay | Admitting: Family Medicine

## 2019-08-08 DIAGNOSIS — E1169 Type 2 diabetes mellitus with other specified complication: Secondary | ICD-10-CM

## 2019-09-13 ENCOUNTER — Other Ambulatory Visit: Payer: Self-pay | Admitting: Family Medicine

## 2019-09-13 MED ORDER — ONETOUCH DELICA LANCETS 33G MISC: 1.0000 | Freq: Every day | 11 refills | Status: DC

## 2019-09-13 NOTE — Telephone Encounter (Signed)
Pt request refill  OneTouch Delica Lancets 98V MISC   Pharmacy states: DX Code Needed .  [id number] - [place], [place] - [address] Phone:  [phone]  Fax:  [phone]     Pt has been waiting since July and no response.  Now he only has 4 left.

## 2019-09-19 ENCOUNTER — Other Ambulatory Visit: Payer: Self-pay

## 2019-09-19 ENCOUNTER — Ambulatory Visit (INDEPENDENT_AMBULATORY_CARE_PROVIDER_SITE_OTHER): Payer: Medicare Other | Admitting: Family Medicine

## 2019-09-19 ENCOUNTER — Encounter: Payer: Self-pay | Admitting: Family Medicine

## 2019-09-19 VITALS — BP 116/70 | HR 69 | Temp 97.7°F | Resp 16 | Ht 67.0 in | Wt 159.2 lb

## 2019-09-19 DIAGNOSIS — M47816 Spondylosis without myelopathy or radiculopathy, lumbar region: Secondary | ICD-10-CM | POA: Diagnosis not present

## 2019-09-19 DIAGNOSIS — E1169 Type 2 diabetes mellitus with other specified complication: Secondary | ICD-10-CM

## 2019-09-19 DIAGNOSIS — I700 Atherosclerosis of aorta: Secondary | ICD-10-CM | POA: Diagnosis not present

## 2019-09-19 DIAGNOSIS — M5441 Lumbago with sciatica, right side: Secondary | ICD-10-CM

## 2019-09-19 DIAGNOSIS — Z8739 Personal history of other diseases of the musculoskeletal system and connective tissue: Secondary | ICD-10-CM

## 2019-09-19 DIAGNOSIS — E119 Type 2 diabetes mellitus without complications: Secondary | ICD-10-CM

## 2019-09-19 DIAGNOSIS — M160 Bilateral primary osteoarthritis of hip: Secondary | ICD-10-CM

## 2019-09-19 DIAGNOSIS — G8929 Other chronic pain: Secondary | ICD-10-CM

## 2019-09-19 DIAGNOSIS — Z1159 Encounter for screening for other viral diseases: Secondary | ICD-10-CM | POA: Diagnosis not present

## 2019-09-19 DIAGNOSIS — D692 Other nonthrombocytopenic purpura: Secondary | ICD-10-CM

## 2019-09-19 DIAGNOSIS — E785 Hyperlipidemia, unspecified: Secondary | ICD-10-CM

## 2019-09-19 LAB — POCT GLYCOSYLATED HEMOGLOBIN (HGB A1C): Hemoglobin A1C: 6.4 % — AB (ref 4.0–5.6)

## 2019-09-19 MED ORDER — ATORVASTATIN CALCIUM 10 MG PO TABS: 10.0000 mg | ORAL_TABLET | Freq: Every day | ORAL | 1 refills | Status: DC

## 2019-09-19 MED ORDER — METFORMIN HCL ER 500 MG PO TB24: 500.0000 mg | ORAL_TABLET | Freq: Every day | ORAL | 1 refills | Status: DC
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
## 2019-09-20 LAB — COMPLETE METABOLIC PANEL WITH GFR
AG Ratio: 1.5 (calc) (ref 1.0–2.5)
ALT: 19 U/L (ref 9–46)
AST: 23 U/L (ref 10–35)
Albumin: 4.4 g/dL (ref 3.6–5.1)
Alkaline phosphatase (APISO): 101 U/L (ref 35–144)
BUN: 25 mg/dL (ref 7–25)
CO2: 27 mmol/L (ref 20–32)
Calcium: 9.9 mg/dL (ref 8.6–10.3)
Chloride: 103 mmol/L (ref 98–110)
Creat: 1.08 mg/dL (ref 0.70–1.18)
GFR, Est African American: 75 mL/min/{1.73_m2} (ref >=60)
GFR, Est Non African American: 65 mL/min/{1.73_m2} (ref >=60)
Globulin: 2.9 g/dL (calc) (ref 1.9–3.7)
Glucose, Bld: 133 mg/dL — ABNORMAL HIGH (ref 65–99)
Potassium: 4.9 mmol/L (ref 3.5–5.3)
Sodium: 141 mmol/L (ref 135–146)
Total Bilirubin: 0.8 mg/dL (ref 0.2–1.2)
Total Protein: 7.3 g/dL (ref 6.1–8.1)

## 2019-09-20 LAB — LIPID PANEL
Cholesterol: 122 mg/dL (ref <200)
HDL: 60 mg/dL (ref >=40)
LDL Cholesterol (Calc): 48 mg/dL (calc)
Non-HDL Cholesterol (Calc): 62 mg/dL (calc) (ref <130)
Total CHOL/HDL Ratio: 2 (calc) (ref <5.0)
Triglycerides: 64 mg/dL (ref <150)

## 2019-09-20 LAB — HEPATITIS C ANTIBODY
Hepatitis C Ab: NONREACTIVE
SIGNAL TO CUT-OFF: 0.03 (ref <1.00)

## 2019-09-22 DIAGNOSIS — E785 Hyperlipidemia, unspecified: Secondary | ICD-10-CM | POA: Diagnosis not present

## 2019-09-22 DIAGNOSIS — Z1159 Encounter for screening for other viral diseases: Secondary | ICD-10-CM | POA: Diagnosis not present

## 2019-09-22 DIAGNOSIS — E1169 Type 2 diabetes mellitus with other specified complication: Secondary | ICD-10-CM | POA: Diagnosis not present

## 2019-09-22 DIAGNOSIS — E119 Type 2 diabetes mellitus without complications: Secondary | ICD-10-CM | POA: Diagnosis not present

## 2019-09-23 LAB — MICROALBUMIN / CREATININE URINE RATIO
Creatinine, Urine: 114 mg/dL (ref 20–320)
Microalb Creat Ratio: 15 mcg/mg creat (ref <30)
Microalb, Ur: 1.7 mg/dL

## 2019-09-27 ENCOUNTER — Encounter: Payer: Self-pay | Admitting: Family Medicine

## 2019-09-27 DIAGNOSIS — E1169 Type 2 diabetes mellitus with other specified complication: Secondary | ICD-10-CM | POA: Insufficient documentation

## 2019-10-03 ENCOUNTER — Telehealth: Payer: Self-pay | Admitting: *Deleted

## 2019-10-03 NOTE — Chronic Care Management (AMB) (Signed)
  Chronic Care Management   Note  10/03/2019 Name: Billy Franco MRN: [id number] DOB: 1940/07/24  Billy Franco is a 79 y.o. year old male who is a primary care patient of Steele Sizer, MD. I reached out to [name] by phone today in response to a referral sent by Billy Franco's health plan.     Billy Franco was given information about Chronic Care Management services today including:  1. CCM service includes personalized support from designated clinical staff supervised by his physician, including individualized plan of care and coordination with other care providers 2. 24/7 contact phone numbers for assistance for urgent and routine care needs. 3. Service will only be billed when office clinical staff spend 20 minutes or more in a month to coordinate care. 4. Only one practitioner may furnish and bill the service in a calendar month. 5. The patient may stop CCM services at any time (effective at the end of the month) by phone call to the office staff. 6. The patient will be responsible for cost sharing (co-pay) of up to 20% of the service fee (after annual deductible is met).  Patient did not agree to enrollment in care management services and does not wish to consider at this time.  Follow up plan:  The care management team is available to follow up with the patient after provider conversation with the patient regarding recommendation for care management engagement and subsequent re-referral to the care management team.   [facility]

## 2019-11-10 ENCOUNTER — Other Ambulatory Visit: Payer: Self-pay

## 2019-11-10 ENCOUNTER — Ambulatory Visit
Payer: Medicare Other | Attending: Student in an Organized Health Care Education/Training Program | Admitting: Student in an Organized Health Care Education/Training Program

## 2019-11-10 ENCOUNTER — Encounter: Payer: Self-pay | Admitting: Student in an Organized Health Care Education/Training Program

## 2019-11-10 VITALS — BP 123/64 | HR 55 | Temp 97.7°F | Resp 18 | Ht 67.0 in | Wt 159.0 lb

## 2019-11-10 DIAGNOSIS — G8929 Other chronic pain: Secondary | ICD-10-CM | POA: Diagnosis not present

## 2019-11-10 DIAGNOSIS — M5416 Radiculopathy, lumbar region: Secondary | ICD-10-CM | POA: Diagnosis not present

## 2019-11-10 DIAGNOSIS — M5116 Intervertebral disc disorders with radiculopathy, lumbar region: Secondary | ICD-10-CM | POA: Insufficient documentation

## 2019-11-10 DIAGNOSIS — G894 Chronic pain syndrome: Secondary | ICD-10-CM | POA: Diagnosis not present

## 2019-11-10 NOTE — Patient Instructions (Signed)
Epidural Steroid Injection Patient Information  Description: The epidural space surrounds the nerves as they exit the spinal cord.  In some patients, the nerves can be compressed and inflamed by a bulging disc or a tight spinal canal (spinal stenosis).  By injecting steroids into the epidural space, we can bring irritated nerves into direct contact with a potentially helpful medication.  These steroids act directly on the irritated nerves and can reduce swelling and inflammation which often leads to decreased pain.  Epidural steroids may be injected anywhere along the spine and from the neck to the low back depending upon the location of your pain.   After numbing the skin with local anesthetic (like Novocaine), a small needle is passed into the epidural space slowly.  You may experience a sensation of pressure while this is being done.  The entire block usually last less than 10 minutes.  Conditions which may be treated by epidural steroids:   Low back and leg pain  Neck and arm pain  Spinal stenosis  Post-laminectomy syndrome  Herpes zoster (shingles) pain  Pain from compression fractures  Preparation for the injection:  1. Do not eat any solid food or dairy products within 8 hours of your appointment.  2. You may drink clear liquids up to 3 hours before appointment.  Clear liquids include water, black coffee, juice or soda.  No milk or cream please. 3. You may take your regular medication, including pain medications, with a sip of water before your appointment  Diabetics should hold regular insulin (if taken separately) and take 1/2 normal NPH dos the morning of the procedure.  Carry some sugar containing items with you to your appointment. 4. A driver must accompany you and be prepared to drive you home after your procedure.  5. Bring all your current medications with your. 6. An IV may be inserted and sedation may be given at the discretion of the physician.   7. A blood pressure  cuff, EKG and other monitors will often be applied during the procedure.  Some patients may need to have extra oxygen administered for a short period. 8. You will be asked to provide medical information, including your allergies, prior to the procedure.  We must know immediately if you are taking blood thinners (like Coumadin/Warfarin)  Or if you are allergic to IV iodine contrast (dye). We must know if you could possible be pregnant.  Possible side-effects:  Bleeding from needle site  Infection (rare, may require surgery)  Nerve injury (rare)  Numbness & tingling (temporary)  Difficulty urinating (rare, temporary)  Spinal headache ( a headache worse with upright posture)  Light -headedness (temporary)  Pain at injection site (several days)  Decreased blood pressure (temporary)  Weakness in arm/leg (temporary)  Pressure sensation in back/neck (temporary)  Call if you experience:  Fever/chills associated with headache or increased back/neck pain.  Headache worsened by an upright position.  New onset weakness or numbness of an extremity below the injection site  Hives or difficulty breathing (go to the emergency room)  Inflammation or drainage at the infection site  Severe back/neck pain  Any new symptoms which are concerning to you  Please note:  Although the local anesthetic injected can often make your back or neck feel good for several hours after the injection, the pain will likely return.  It takes 3-7 days for steroids to work in the epidural space.  You may not notice any pain relief for at least that one week.    If effective, we will often do a series of three injections spaced 3-6 weeks apart to maximally decrease your pain.  After the initial series, we generally will wait several months before considering a repeat injection of the same type.  If you have any questions, please call [phone] [facility] 
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
## 2019-11-28 ENCOUNTER — Encounter: Payer: Self-pay | Admitting: Student in an Organized Health Care Education/Training Program

## 2019-11-28 ENCOUNTER — Ambulatory Visit
Admission: RE | Admit: 2019-11-28 | Discharge: 2019-11-28 | Disposition: A | Discharge status: Home / Self Care | Payer: Medicare Other | Source: Ambulatory Visit | Attending: Student in an Organized Health Care Education/Training Program | Admitting: Student in an Organized Health Care Education/Training Program

## 2019-11-28 ENCOUNTER — Other Ambulatory Visit: Payer: Self-pay

## 2019-11-28 ENCOUNTER — Ambulatory Visit (HOSPITAL_BASED_OUTPATIENT_CLINIC_OR_DEPARTMENT_OTHER): Payer: Medicare Other | Admitting: Student in an Organized Health Care Education/Training Program

## 2019-11-28 VITALS — BP 135/93 | HR 82 | Temp 97.7°F | Resp 26 | Ht 67.0 in | Wt 159.0 lb

## 2019-11-28 DIAGNOSIS — M5416 Radiculopathy, lumbar region: Secondary | ICD-10-CM

## 2019-11-28 DIAGNOSIS — G8929 Other chronic pain: Secondary | ICD-10-CM

## 2019-11-28 DIAGNOSIS — G894 Chronic pain syndrome: Secondary | ICD-10-CM | POA: Insufficient documentation

## 2019-11-28 MED ORDER — DEXAMETHASONE SODIUM PHOSPHATE 10 MG/ML IJ SOLN
10.0000 mg | Freq: Once | INTRAMUSCULAR | Status: AC
  Administered 2019-11-28: 10 mg
  Filled 2019-11-28: qty 1

## 2019-11-28 MED ORDER — LIDOCAINE HCL 2 % IJ SOLN
20.0000 mL | Freq: Once | INTRAMUSCULAR | Status: AC
  Administered 2019-11-28: 400 mg
  Filled 2019-11-28: qty 20

## 2019-11-28 MED ORDER — ROPIVACAINE HCL 2 MG/ML IJ SOLN
2.0000 mL | Freq: Once | INTRAMUSCULAR | Status: AC
  Administered 2019-11-28: 2 mL via EPIDURAL
  Filled 2019-11-28: qty 10

## 2019-11-28 MED ORDER — SODIUM CHLORIDE 0.9% FLUSH
2.0000 mL | Freq: Once | INTRAVENOUS | Status: AC
  Administered 2019-11-28: 2 mL

## 2019-11-28 MED ORDER — IOHEXOL 180 MG/ML  SOLN
10.0000 mL | Freq: Once | INTRAMUSCULAR | Status: AC
  Administered 2019-11-28: 10 mL via EPIDURAL

## 2019-11-28 MED ORDER — SODIUM CHLORIDE (PF) 0.9 % IJ SOLN
INTRAMUSCULAR | Status: AC
  Filled 2019-11-28: qty 10

## 2019-11-28 NOTE — Patient Instructions (Signed)
____________________________________________________________________________________________  Post-Procedure Discharge Instructions  Instructions:  Apply ice:   Purpose: This will minimize any swelling and discomfort after procedure.   When: Day of procedure, as soon as you get home.  How: Fill a plastic sandwich bag with crushed ice. Cover it with a small towel and apply to injection site.  How long: (15 min on, 15 min off) Apply for 15 minutes then remove x 15 minutes.  Repeat sequence on day of procedure, until you go to bed.  Apply heat:   Purpose: To treat any soreness and discomfort from the procedure.  When: Starting the next day after the procedure.  How: Apply heat to procedure site starting the day following the procedure.  How long: May continue to repeat daily, until discomfort goes away.  Food intake: Start with clear liquids (like water) and advance to regular food, as tolerated.   Physical activities: Keep activities to a minimum for the first 8 hours after the procedure. After that, then as tolerated.  Driving: If you have received any sedation, be responsible and do not drive. You are not allowed to drive for 24 hours after having sedation.  Blood thinner: (Applies only to those taking blood thinners) You may restart your blood thinner 6 hours after your procedure.  Insulin: (Applies only to Diabetic patients taking insulin) As soon as you can eat, you may resume your normal dosing schedule.  Infection prevention: Keep procedure site clean and dry. Shower daily and clean area with soap and water.  Post-procedure Pain Diary: Extremely important that this be done correctly and accurately. Recorded information will be used to determine the next step in treatment. For the purpose of accuracy, follow these rules:  Evaluate only the area treated. Do not report or include pain from an untreated area. For the purpose of this evaluation, ignore all other areas of pain,  except for the treated area.  After your procedure, avoid taking a long nap and attempting to complete the pain diary after you wake up. Instead, set your alarm clock to go off every hour, on the hour, for the initial 8 hours after the procedure. Document the duration of the numbing medicine, and the relief you are getting from it.  Do not go to sleep and attempt to complete it later. It will not be accurate. If you received sedation, it is likely that you were given a medication that may cause amnesia. Because of this, completing the diary at a later time may cause the information to be inaccurate. This information is needed to plan your care.  Follow-up appointment: Keep your post-procedure follow-up evaluation appointment after the procedure (usually 2 weeks for most procedures, 6 weeks for radiofrequencies). DO NOT FORGET to bring you pain diary with you.   Expect: (What should I expect to see with my procedure?)  From numbing medicine (AKA: Local Anesthetics): Numbness or decrease in pain. You may also experience some weakness, which if present, could last for the duration of the local anesthetic.  Onset: Full effect within 15 minutes of injected.  Duration: It will depend on the type of local anesthetic used. On the average, 1 to 8 hours.   From steroids (Applies only if steroids were used): Decrease in swelling or inflammation. Once inflammation is improved, relief of the pain will follow.  Onset of benefits: Depends on the amount of swelling present. The more swelling, the longer it will take for the benefits to be seen. In some cases, up to 10 days.    Duration: Steroids will stay in the system x 2 weeks. Duration of benefits will depend on multiple posibilities including persistent irritating factors.  Side-effects: If present, they may typically last 2 weeks (the duration of the steroids).  Frequent: Cramps (if they occur, drink Gatorade and take over-the-counter Magnesium 450-500 mg  once to twice a day); water retention with temporary weight gain; increases in blood sugar; decreased immune system response; increased appetite.  Occasional: Facial flushing (red, warm cheeks); mood swings; menstrual changes.  Uncommon: Long-term decrease or suppression of natural hormones; bone thinning. (These are more common with higher doses or more frequent use. This is why we prefer that our patients avoid having any injection therapies in other practices.)   Very Rare: Severe mood changes; psychosis; aseptic necrosis.  From procedure: Some discomfort is to be expected once the numbing medicine wears off. This should be minimal if ice and heat are applied as instructed.  Call if: (When should I call?)  You experience numbness and weakness that gets worse with time, as opposed to wearing off.  New onset bowel or bladder incontinence. (Applies only to procedures done in the spine)  Emergency Numbers:  Durning business hours (Monday - Thursday, 8:00 AM - 4:00 PM) (Friday, 9:00 AM - 12:00 Noon): [phone]  After hours: [phone]  NOTE: If you are having a problem and are unable connect with, or to talk to a provider, then go to your nearest urgent care or emergency department. If the problem is serious and urgent, please call 911. ____________________________________________________________________________________________  Epidural Steroid Injection  An epidural steroid injection is a shot of steroid medicine and numbing medicine that is given into the space between the spinal cord and the bones of the back (epidural space). The shot helps relieve pain caused by an irritated or swollen nerve root. The amount of pain relief you get from the injection depends on what is causing the nerve to be swollen and irritated, and how long your pain lasts. You are more likely to benefit from this injection if your pain is strong and comes on suddenly rather than if you have had long-term  (chronic) pain. Tell a health care provider about:  Any allergies you have.  All medicines you are taking, including vitamins, herbs, eye drops, creams, and over-the-counter medicines.  Any problems you or family members have had with anesthetic medicines.  Any blood disorders you have.  Any surgeries you have had.  Any medical conditions you have.  Whether you are pregnant or may be pregnant. What are the risks? Generally, this is a safe procedure. However, problems may occur, including:  Headache.  Bleeding.  Infection.  Allergic reaction to medicines.  Nerve damage. What happens before the procedure? Staying hydrated Follow instructions from your health care provider about hydration, which may include:  Up to 2 hours before the procedure - you may continue to drink clear liquids, such as water, clear fruit juice, black coffee, and plain tea. Eating and drinking restrictions Follow instructions from your health care provider about eating and drinking, which may include:  8 hours before the procedure - stop eating heavy meals or foods, such as meat, fried foods, or fatty foods.  6 hours before the procedure - stop eating light meals or foods, such as toast or cereal.  6 hours before the procedure - stop drinking milk or drinks that contain milk.  2 hours before the procedure - stop drinking clear liquids. Medicines  You may be given medicines  to lower anxiety.  Ask your health care provider about: ? Changing or stopping your regular medicines. This is especially important if you are taking diabetes medicines or blood thinners. ? Taking medicines such as aspirin and ibuprofen. These medicines can thin your blood. Do not take these medicines unless your health care provider tells you to take them. ? Taking over-the-counter medicines, vitamins, herbs, and supplements.  Ask your health care provider what steps will be taken to prevent infection. General  instructions  Plan to have someone take you home from the hospital or clinic.  If you will be going home right after the procedure, plan to have someone with you for 24 hours. What happens during the procedure?  An IV will be inserted into one of your veins.  You will be given one or more of the following: ? A medicine to help you relax (sedative). ? A medicine to numb the area (local anesthetic).  You will be asked to lie on your abdomen or sit.  The injection site will be cleaned.  A needle will be inserted through your skin into the epidural space. This may cause you some discomfort. An X-ray machine will be used to guide the needle as close as possible to the affected nerve.  A steroid medicine and a local anesthetic will be injected into the epidural space.  The needle and IV will be removed.  A bandage (dressing) will be put over the injection site. The procedure may vary among health care providers and hospitals. What can I expect after the procedure? Follow these instructions at home: Injection site care  You may remove the bandage (dressing) after 24 hours.  Check your injection site every day for signs of infection. Check for: ? Redness, swelling, or pain. ? Fluid or blood. ? Warmth. ? Pus or a bad smell. Managing pain, stiffness, and swelling  For 24 hours after the procedure: ? Avoid using heat on the injection site. ? Do not take baths, swim, or use a hot tub until your health care provider approves. Ask your health care provider if you may take a shower. You may only be allowed to take sponge baths.  If directed, put ice on the injection site. To do this: ? Put ice in a plastic bag. ? Place a towel between your skin and the bag. ? Leave the ice on for 20 minutes, 2-3 times a day.  Activity  Do not drive for 24 hours if you were given a sedative during your procedure.  Return to your normal activities as told by your health care provider. Ask your  health care provider what activities are safe for you. General instructions  Your blood pressure, heart rate, breathing rate, and blood oxygen level will be monitored until you leave the hospital or clinic.  Your arm or leg may feel weak or numb for a few hours.  The injection site may feel sore.  Take over-the-counter and prescription medicines only as told by your health care provider.  Drink enough fluid to keep your urine pale yellow.  Keep all follow-up visits as told by your health care provider. This is important. Contact a health care provider if:  You have any of these signs of infection: ? Redness, swelling, or pain around your injection site. ? Fluid or blood coming from your injection site. ? Warmth coming from your injection site. ? Pus or a bad smell coming from your injection site. ? A fever.  You continue to have  pain and soreness around the injection site, even after taking over-the-counter pain medicine.  You have severe, sudden, or lasting nausea or vomiting. Get help right away if:  You have severe pain at the injection site that is not relieved by medicines.  You develop a severe headache or a stiff neck.  You become sensitive to light.  You have any new numbness or weakness in your legs or arms.  You lose control of your bladder or bowel movements.  You have trouble breathing. Summary  An epidural steroid injection is a shot of steroid medicine and numbing medicine that is given into the epidural space.  The shot helps relieve pain caused by an irritated or swollen nerve root.  You are more likely to benefit from this injection if your pain is strong and comes on suddenly rather than if you have had chronic pain. This information is not intended to replace advice given to you by your health care provider. Make sure you discuss any questions you have with your health care provider. Document Revised: 07/19/2018 Document Reviewed: 07/19/2018 [employer]  Patient Education  [place].

## 2019-11-28 NOTE — Progress Notes (Signed)
Safety precautions to be maintained throughout the outpatient stay will include: orient to surroundings, keep bed in low position, maintain call bell within reach at all times, provide assistance with transfer out of bed and ambulation.  

## 2019-11-28 NOTE — Progress Notes (Signed)
PROVIDER NOTE: Information contained herein reflects review and annotations entered in association with encounter. Interpretation of such information and data should be left to medically-trained personnel. Information provided to patient can be located elsewhere in the medical record under "Patient Instructions". Document created using STT-dictation technology, any transcriptional errors that may result from process are unintentional.    Patient: Billy Franco  Service Category: Procedure  Provider: Gillis Santa, MD  DOB: 10/23/1940  DOS: 11/28/2019  Location: [facility]  MRN: [id number]  Setting: Ambulatory - outpatient  Referring Provider: Steele Sizer, MD  Type: Established Patient  Specialty: Interventional Pain Management  PCP: Steele Sizer, MD   Primary Reason for Visit: Interventional Pain Management Treatment. CC: Back Pain (low right)  Procedure:          Anesthesia, Analgesia, Anxiolysis:  Type: Diagnostic Inter-Laminar Epidural Steroid Injection  #1  Region: Lumbar Level: L5-S1 Level. Laterality: Right-Sided         Type: Local Anesthesia  Local Anesthetic: Lidocaine 1-2%  Position: Prone with head of the table was raised to facilitate breathing.   Indications: 1. Chronic radicular lumbar pain (right)   2. Lumbar radiculopathy   3. Chronic pain syndrome    Pain Score: Pre-procedure: 8 /10 Post-procedure: 8 /10   Pre-op Assessment:  Billy Franco is a 79 y.o. (year old), male patient, seen today for interventional treatment. He  has a past surgical history that includes Nasal fracture surgery (1994) and Ureterolithotomy (Right, 2015). Billy Franco has a current medication list which includes the following prescription(s): atorvastatin, blood glucose monitoring suppl, docusate sodium, glucose blood, ibuprofen, metformin, multiple vitamin, theralith xr, omeprazole, onetouch delica lancets 40J, sildenafil, tamsulosin, and triamcinolone cream, and the  following Facility-Administered Medications: dexamethasone, iohexol, lidocaine, ropivacaine (pf) 2 mg/ml (0.2%), and sodium chloride flush. His primarily concern today is the Back Pain (low right)  Initial Vital Signs:  Pulse/HCG Rate: 64  Temp: 97.7 F (36.5 C) Resp: 16 BP: 130/71 SpO2: 100 %  BMI: Estimated body mass index is 24.9 kg/m as calculated from the following:   Height as of this encounter: 5\' 7"  (1.702 m).   Weight as of this encounter: 159 lb (72.1 kg).  Risk Assessment: Allergies: Reviewed. He is allergic to codeine and lovastatin.  Allergy Precautions: None required Coagulopathies: Reviewed. None identified.  Blood-thinner therapy: None at this time Active Infection(s): Reviewed. None identified. Billy Franco is afebrile  Site Confirmation: Billy Franco was asked to confirm the procedure and laterality before marking the site Procedure checklist: Completed Consent: Before the procedure and under the influence of no sedative(s), amnesic(s), or anxiolytics, the patient was informed of the treatment options, risks and possible complications. To fulfill our ethical and legal obligations, as recommended by the American Medical Association's Code of Ethics, I have informed the patient of my clinical impression; the nature and purpose of the treatment or procedure; the risks, benefits, and possible complications of the intervention; the alternatives, including doing nothing; the risk(s) and benefit(s) of the alternative treatment(s) or procedure(s); and the risk(s) and benefit(s) of doing nothing. The patient was provided information about the general risks and possible complications associated with the procedure. These may include, but are not limited to: failure to achieve desired goals, infection, bleeding, organ or nerve damage, allergic reactions, paralysis, and death. In addition, the patient was informed of those risks and complications associated to Spine-related procedures,  such as failure to decrease pain; infection (i.e.: Meningitis, epidural or intraspinal abscess); bleeding (i.e.: epidural hematoma, subarachnoid  hemorrhage, or any other type of intraspinal or peri-dural bleeding); organ or nerve damage (i.e.: Any type of peripheral nerve, nerve root, or spinal cord injury) with subsequent damage to sensory, motor, and/or autonomic systems, resulting in permanent pain, numbness, and/or weakness of one or several areas of the body; allergic reactions; (i.e.: anaphylactic reaction); and/or death. Furthermore, the patient was informed of those risks and complications associated with the medications. These include, but are not limited to: allergic reactions (i.e.: anaphylactic or anaphylactoid reaction(s)); adrenal axis suppression; blood sugar elevation that in diabetics may result in ketoacidosis or comma; water retention that in patients with history of congestive heart failure may result in shortness of breath, pulmonary edema, and decompensation with resultant heart failure; weight gain; swelling or edema; medication-induced neural toxicity; particulate matter embolism and blood vessel occlusion with resultant organ, and/or nervous system infarction; and/or aseptic necrosis of one or more joints. Finally, the patient was informed that Medicine is not an exact science; therefore, there is also the possibility of unforeseen or unpredictable risks and/or possible complications that may result in a catastrophic outcome. The patient indicated having understood very clearly. We have given the patient no guarantees and we have made no promises. Enough time was given to the patient to ask questions, all of which were answered to the patient's satisfaction. Billy Franco has indicated that he wanted to continue with the procedure. Attestation: I, the ordering provider, attest that I have discussed with the patient the benefits, risks, side-effects, alternatives, likelihood of achieving  goals, and potential problems during recovery for the procedure that I have provided informed consent. Date  Time: 11/28/2019 11:17 AM  Pre-Procedure Preparation:  Monitoring: As per clinic protocol. Respiration, ETCO2, SpO2, BP, heart rate and rhythm monitor placed and checked for adequate function Safety Precautions: Patient was assessed for positional comfort and pressure points before starting the procedure. Time-out: I initiated and conducted the "Time-out" before starting the procedure, as per protocol. The patient was asked to participate by confirming the accuracy of the "Time Out" information. Verification of the correct person, site, and procedure were performed and confirmed by me, the nursing staff, and the patient. "Time-out" conducted as per Joint Commission's Universal Protocol (UP.01.01.01). Time: 1207  Description of Procedure:          Target Area: The interlaminar space, initially targeting the lower laminar border of the superior vertebral body. Approach: Paramedial approach. Area Prepped: Entire Posterior Lumbar Region DuraPrep (Iodine Povacrylex [0.7% available iodine] and Isopropyl Alcohol, 74% w/w) Safety Precautions: Aspiration looking for blood return was conducted prior to all injections. At no point did we inject any substances, as a needle was being advanced. No attempts were made at seeking any paresthesias. Safe injection practices and needle disposal techniques used. Medications properly checked for expiration dates. SDV (single dose vial) medications used. Description of the Procedure: Protocol guidelines were followed. The procedure needle was introduced through the skin, ipsilateral to the reported pain, and advanced to the target area. Bone was contacted and the needle walked caudad, until the lamina was cleared. The epidural space was identified using "loss-of-resistance technique" with 2-3 ml of PF-NaCl (0.9% NSS), in a 5cc LOR glass syringe.  Vitals:    11/28/19 1121 11/28/19 1205 11/28/19 1210 11/28/19 1218  BP: 130/71 (!) 148/79 (!) 144/77 (!) 135/93  Pulse: 64 82 83 82  Resp: 16 (!) 24 (!) 26   Temp: 97.7 F (36.5 C)     SpO2: 100% 100% 100%   Weight: 159  lb (72.1 kg)     Height: 5\' 7"  (1.702 m)       Start Time: 1207 hrs. End Time: 1215 hrs.  Materials:  Needle(s) Type: Epidural needle Gauge: 22G Length: 3.5-in Medication(s): Please see orders for medications and dosing details. 6 cc solution made of 3 cc of preservative-free saline, 2 cc of 0.2% ropivacaine, 1 cc of Decadron 10 mg/cc.  Imaging Guidance (Spinal):          Type of Imaging Technique: Fluoroscopy Guidance (Spinal) Indication(s): Assistance in needle guidance and placement for procedures requiring needle placement in or near specific anatomical locations not easily accessible without such assistance. Exposure Time: Please see nurses notes. Contrast: Before injecting any contrast, we confirmed that the patient did not have an allergy to iodine, shellfish, or radiological contrast. Once satisfactory needle placement was completed at the desired level, radiological contrast was injected. Contrast injected under live fluoroscopy. No contrast complications. See chart for type and volume of contrast used. Fluoroscopic Guidance: I was personally present during the use of fluoroscopy. "Tunnel Vision Technique" used to obtain the best possible view of the target area. Parallax error corrected before commencing the procedure. "Direction-depth-direction" technique used to introduce the needle under continuous pulsed fluoroscopy. Once target was reached, antero-posterior, oblique, and lateral fluoroscopic projection used confirm needle placement in all planes. Images permanently stored in EMR. Interpretation: I personally interpreted the imaging intraoperatively. Adequate needle placement confirmed in multiple planes. Appropriate spread of contrast into desired area was observed. No  evidence of afferent or efferent intravascular uptake. No intrathecal or subarachnoid spread observed. Permanent images saved into the patient's record.  Antibiotic Prophylaxis:   Anti-infectives (From admission, onward)   None     Indication(s): None identified  Post-operative Assessment:  Post-procedure Vital Signs:  Pulse/HCG Rate: 82 (sv)  Temp: 97.7 F (36.5 C) Resp: (!) 26 BP: (!) 135/93 SpO2: 100 %  EBL: None  Complications: No immediate post-treatment complications observed by team, or reported by patient.  Note: The patient tolerated the entire procedure well. A repeat set of vitals were taken after the procedure and the patient was kept under observation following institutional policy, for this type of procedure. Post-procedural neurological assessment was performed, showing return to baseline, prior to discharge. The patient was provided with post-procedure discharge instructions, including a section on how to identify potential problems. Should any problems arise concerning this procedure, the patient was given instructions to immediately contact us, at any time, without hesitation. In any case, we plan to contact the patient by telephone for a follow-up status report regarding this interventional procedure.  Comments:  No additional relevant information.  Plan of Care  Orders:  Orders Placed This Encounter  Procedures  . [facility] C-ARM 1-60 MIN NO REPORT    Intraoperative interpretation by procedural physician at [facility].    Standing Status:   Standing    Number of Occurrences:   1    Order Specific Question:   Reason for exam:    Answer:   Assistance in needle guidance and placement for procedures requiring needle placement in or near specific anatomical locations not easily accessible without such assistance.   Medications ordered for procedure: Meds ordered this encounter  Medications  . iohexol (OMNIPAQUE) 180 MG/ML injection 10 mL     Must be Myelogram-compatible. If not available, you may substitute with a water-soluble, non-ionic, hypoallergenic, myelogram-compatible radiological contrast medium.  Marland Kitchen lidocaine (XYLOCAINE) 2 % (with pres) injection 400 mg  .  ropivacaine (PF) 2 mg/mL (0.2%) (NAROPIN) injection 2 mL  . sodium chloride flush (NS) 0.9 % injection 2 mL  . dexamethasone (DECADRON) injection 10 mg   Medications administered: Billy Franco had no medications administered during this visit.  See the medical record for exact dosing, route, and time of administration.  Follow-up plan:   Return in about 5 weeks (around 01/02/2020) for Post Procedure Evaluation, virtual.      RIGHT L5/S1 ESI 11/28/19   Recent Visits Date Type Provider Dept  11/10/19 Office Visit Gillis Santa, MD [facility]  Showing recent visits within past 90 days and meeting all other requirements Today's Visits Date Type Provider Dept  11/28/19 Procedure visit Gillis Santa, MD [facility]  Showing today's visits and meeting all other requirements Future Appointments Date Type Provider Dept  01/02/20 Appointment Gillis Santa, MD [facility]  Showing future appointments within next 90 days and meeting all other requirements  Disposition: Discharge home  Discharge (Date  Time): 11/28/2019;   hrs.   Primary Care Physician: Steele Sizer, MD Location: [facility] Note by: Gillis Santa, MD Date: 11/28/2019; Time: 12:29 PM  Disclaimer:  Medicine is not an exact science. The only guarantee in medicine is that nothing is guaranteed. It is important to note that the decision to proceed with this intervention was based on the information collected from the patient. The Data and conclusions were drawn from the patient's questionnaire, the interview, and the physical examination. Because the information was provided in large part by the patient, it cannot be guaranteed that it has not  been purposely or unconsciously manipulated. Every effort has been made to obtain as much relevant data as possible for this evaluation. It is important to note that the conclusions that lead to this procedure are derived in large part from the available data. Always take into account that the treatment will also be dependent on availability of resources and existing treatment guidelines, considered by other Pain Management Practitioners as being common knowledge and practice, at the time of the intervention. For Medico-Legal purposes, it is also important to point out that variation in procedural techniques and pharmacological choices are the acceptable norm. The indications, contraindications, technique, and results of the above procedure should only be interpreted and judged by a Board-Certified Interventional Pain Specialist with extensive familiarity and expertise in the same exact procedure and technique.

## 2019-11-29 ENCOUNTER — Telehealth: Payer: Self-pay

## 2019-11-29 NOTE — Telephone Encounter (Signed)
Called PP. Wife states his BS was elevated to about 296. Informed her that we gave patient steroid and that could be the reason. Denies any other needs.  Instructed to call if needed.

## 2019-12-13 DIAGNOSIS — N390 Urinary tract infection, site not specified: Secondary | ICD-10-CM | POA: Diagnosis not present

## 2019-12-13 DIAGNOSIS — R1032 Left lower quadrant pain: Secondary | ICD-10-CM | POA: Diagnosis not present

## 2019-12-14 ENCOUNTER — Other Ambulatory Visit: Payer: Self-pay | Admitting: Urology

## 2019-12-14 DIAGNOSIS — R1032 Left lower quadrant pain: Secondary | ICD-10-CM

## 2019-12-14 DIAGNOSIS — N200 Calculus of kidney: Secondary | ICD-10-CM

## 2019-12-19 ENCOUNTER — Ambulatory Visit: Payer: Self-pay

## 2019-12-19 NOTE — Telephone Encounter (Signed)
Patient and wife called and says that since Friday they noticed his ankles and feet swelling. He says that the swelling is not to the lower legs, just the ankles and feet, no pain, no redness. His wife says the ankles are so swollen the bones are not visible. She says his feet are swollen that he wears his shoes looser than before. He also says he has abdominal swelling that's causing him to have difficulty breathing at times. He says his urologist prescribed antibiotics for possible kidney stone infection and advised him to see his PCP for the abdominal swelling. No availability with PCP, appointment scheduled for Wednesday, 12/21/19 at 1040 with Dr. Roxan Hockey, care advice given, they both verbalized understanding.  Reason for Disposition . Swollen ankle joint  (Exception: area of localized swelling which is itchy)  Answer Assessment - Initial Assessment Questions 1. LOCATION: "Which ankle is swollen?" "Where is the swelling?"     Both feet and ankles 2. ONSET: "When did the swelling start?"     Friday it was noticed 3. SIZE: "How large is the swelling?"     Ankles unable to see the bone, feet swollen so much shoes are looser than normal 4. PAIN: "Is there any pain?" If Yes, ask: "How bad is it?" (Scale 1-10; or mild, moderate, severe)   - NONE (0): no pain.   - MILD (1-3): doesn't interfere with normal activities.    - MODERATE (4-7): interferes with normal activities (e.g., work or school) or awakens from sleep, limping.    - SEVERE (8-10): excruciating pain, unable to do any normal activities, unable to walk.      No 5. CAUSE: "What do you think caused the ankle swelling?"      No idea 6. OTHER SYMPTOMS: "Do you have any other symptoms?" (e.g., fever, chest pain, difficulty breathing, calf pain)     No 7. PREGNANCY: "Is there any chance you are pregnant?" "When was your last menstrual period?"     N/A  Protocols used: ANKLE SWELLING-A-AH

## 2019-12-21 ENCOUNTER — Inpatient Hospital Stay
Admission: EM | Admit: 2019-12-21 | Discharge: 2019-12-25 | DRG: 308 | Disposition: A | Discharge status: Home / Self Care | Payer: Medicare Other | Attending: Internal Medicine | Admitting: Internal Medicine

## 2019-12-21 ENCOUNTER — Encounter: Payer: Self-pay | Admitting: Internal Medicine

## 2019-12-21 ENCOUNTER — Encounter: Payer: Self-pay | Admitting: Family Medicine

## 2019-12-21 ENCOUNTER — Other Ambulatory Visit: Payer: Self-pay

## 2019-12-21 ENCOUNTER — Emergency Department: Payer: Medicare Other

## 2019-12-21 ENCOUNTER — Encounter: Payer: Self-pay | Admitting: Emergency Medicine

## 2019-12-21 ENCOUNTER — Ambulatory Visit (INDEPENDENT_AMBULATORY_CARE_PROVIDER_SITE_OTHER): Payer: Medicare Other | Admitting: Internal Medicine

## 2019-12-21 VITALS — BP 110/82 | HR 114 | Temp 97.4°F | Resp 16 | Ht 67.0 in | Wt 169.8 lb

## 2019-12-21 DIAGNOSIS — R778 Other specified abnormalities of plasma proteins: Secondary | ICD-10-CM | POA: Diagnosis not present

## 2019-12-21 DIAGNOSIS — G8929 Other chronic pain: Secondary | ICD-10-CM | POA: Diagnosis not present

## 2019-12-21 DIAGNOSIS — R0602 Shortness of breath: Secondary | ICD-10-CM | POA: Diagnosis not present

## 2019-12-21 DIAGNOSIS — N200 Calculus of kidney: Secondary | ICD-10-CM | POA: Diagnosis not present

## 2019-12-21 DIAGNOSIS — R600 Localized edema: Secondary | ICD-10-CM

## 2019-12-21 DIAGNOSIS — N179 Acute kidney failure, unspecified: Secondary | ICD-10-CM | POA: Diagnosis not present

## 2019-12-21 DIAGNOSIS — I5023 Acute on chronic systolic (congestive) heart failure: Secondary | ICD-10-CM | POA: Diagnosis not present

## 2019-12-21 DIAGNOSIS — E1169 Type 2 diabetes mellitus with other specified complication: Secondary | ICD-10-CM

## 2019-12-21 DIAGNOSIS — I130 Hypertensive heart and chronic kidney disease with heart failure and stage 1 through stage 4 chronic kidney disease, or unspecified chronic kidney disease: Secondary | ICD-10-CM | POA: Diagnosis present

## 2019-12-21 DIAGNOSIS — G894 Chronic pain syndrome: Secondary | ICD-10-CM

## 2019-12-21 DIAGNOSIS — I4891 Unspecified atrial fibrillation: Secondary | ICD-10-CM | POA: Diagnosis not present

## 2019-12-21 DIAGNOSIS — Z888 Allergy status to other drugs, medicaments and biological substances status: Secondary | ICD-10-CM

## 2019-12-21 DIAGNOSIS — N182 Chronic kidney disease, stage 2 (mild): Secondary | ICD-10-CM | POA: Diagnosis not present

## 2019-12-21 DIAGNOSIS — N400 Enlarged prostate without lower urinary tract symptoms: Secondary | ICD-10-CM | POA: Diagnosis present

## 2019-12-21 DIAGNOSIS — Z79899 Other long term (current) drug therapy: Secondary | ICD-10-CM

## 2019-12-21 DIAGNOSIS — K219 Gastro-esophageal reflux disease without esophagitis: Secondary | ICD-10-CM

## 2019-12-21 DIAGNOSIS — M545 Low back pain, unspecified: Secondary | ICD-10-CM | POA: Diagnosis not present

## 2019-12-21 DIAGNOSIS — Z87442 Personal history of urinary calculi: Secondary | ICD-10-CM

## 2019-12-21 DIAGNOSIS — E1122 Type 2 diabetes mellitus with diabetic chronic kidney disease: Secondary | ICD-10-CM | POA: Diagnosis present

## 2019-12-21 DIAGNOSIS — Z20822 Contact with and (suspected) exposure to covid-19: Secondary | ICD-10-CM | POA: Diagnosis not present

## 2019-12-21 DIAGNOSIS — I10 Essential (primary) hypertension: Secondary | ICD-10-CM

## 2019-12-21 DIAGNOSIS — I959 Hypotension, unspecified: Secondary | ICD-10-CM | POA: Diagnosis present

## 2019-12-21 DIAGNOSIS — I248 Other forms of acute ischemic heart disease: Secondary | ICD-10-CM | POA: Diagnosis present

## 2019-12-21 DIAGNOSIS — I480 Paroxysmal atrial fibrillation: Secondary | ICD-10-CM

## 2019-12-21 DIAGNOSIS — E785 Hyperlipidemia, unspecified: Secondary | ICD-10-CM

## 2019-12-21 DIAGNOSIS — I700 Atherosclerosis of aorta: Secondary | ICD-10-CM | POA: Diagnosis present

## 2019-12-21 DIAGNOSIS — D631 Anemia in chronic kidney disease: Secondary | ICD-10-CM | POA: Diagnosis present

## 2019-12-21 DIAGNOSIS — Z7984 Long term (current) use of oral hypoglycemic drugs: Secondary | ICD-10-CM

## 2019-12-21 DIAGNOSIS — Z885 Allergy status to narcotic agent status: Secondary | ICD-10-CM

## 2019-12-21 DIAGNOSIS — Z8249 Family history of ischemic heart disease and other diseases of the circulatory system: Secondary | ICD-10-CM

## 2019-12-21 DIAGNOSIS — R300 Dysuria: Secondary | ICD-10-CM | POA: Diagnosis present

## 2019-12-21 DIAGNOSIS — R109 Unspecified abdominal pain: Secondary | ICD-10-CM | POA: Diagnosis not present

## 2019-12-21 DIAGNOSIS — J90 Pleural effusion, not elsewhere classified: Secondary | ICD-10-CM | POA: Diagnosis not present

## 2019-12-21 LAB — BASIC METABOLIC PANEL
Anion gap: 12 (ref 5–15)
BUN: 19 mg/dL (ref 8–23)
CO2: 24 mmol/L (ref 22–32)
Calcium: 9.8 mg/dL (ref 8.9–10.3)
Chloride: 103 mmol/L (ref 98–111)
Creatinine, Ser: 1.48 mg/dL — ABNORMAL HIGH (ref 0.61–1.24)
GFR, Estimated: 48 mL/min — ABNORMAL LOW (ref >60)
Glucose, Bld: 123 mg/dL — ABNORMAL HIGH (ref 70–99)
Potassium: 4.9 mmol/L (ref 3.5–5.1)
Sodium: 139 mmol/L (ref 135–145)

## 2019-12-21 LAB — CBC
HCT: 37.6 % — ABNORMAL LOW (ref 39.0–52.0)
Hemoglobin: 12 g/dL — ABNORMAL LOW (ref 13.0–17.0)
MCH: 30.9 pg (ref 26.0–34.0)
MCHC: 31.9 g/dL (ref 30.0–36.0)
MCV: 96.9 fL (ref 80.0–100.0)
Platelets: 193 10*3/uL (ref 150–400)
RBC: 3.88 MIL/uL — ABNORMAL LOW (ref 4.22–5.81)
RDW: 13.5 % (ref 11.5–15.5)
WBC: 6.1 10*3/uL (ref 4.0–10.5)
nRBC: 0 % (ref 0.0–0.2)

## 2019-12-21 LAB — RESP PANEL BY RT-PCR (FLU A&B, COVID) ARPGX2
Influenza A by PCR: NEGATIVE
Influenza B by PCR: NEGATIVE
SARS Coronavirus 2 by RT PCR: NEGATIVE

## 2019-12-21 LAB — TROPONIN I (HIGH SENSITIVITY): Troponin I (High Sensitivity): 19 ng/L — ABNORMAL HIGH (ref <18)

## 2019-12-21 LAB — BRAIN NATRIURETIC PEPTIDE: B Natriuretic Peptide: 564.1 pg/mL — ABNORMAL HIGH (ref 0.0–100.0)

## 2019-12-21 MED ORDER — INSULIN ASPART 100 UNIT/ML ~~LOC~~ SOLN
0.0000 [IU] | Freq: Three times a day (TID) | SUBCUTANEOUS | Status: DC
  Administered 2019-12-22 – 2019-12-23 (×3): 2 [IU] via SUBCUTANEOUS
  Administered 2019-12-23 – 2019-12-24 (×2): 3 [IU] via SUBCUTANEOUS
  Administered 2019-12-24: 2 [IU] via SUBCUTANEOUS
  Administered 2019-12-25: 5 [IU] via SUBCUTANEOUS
  Filled 2019-12-21 (×7): qty 1

## 2019-12-21 MED ORDER — ACETAMINOPHEN 325 MG PO TABS
325.0000 mg | ORAL_TABLET | ORAL | Status: DC | PRN
  Administered 2019-12-22: 325 mg via ORAL
  Filled 2019-12-21: qty 1

## 2019-12-21 MED ORDER — SODIUM CHLORIDE 0.9% FLUSH
3.0000 mL | Freq: Two times a day (BID) | INTRAVENOUS | Status: DC
  Administered 2019-12-21 – 2019-12-24 (×5): 3 mL via INTRAVENOUS

## 2019-12-21 MED ORDER — ATORVASTATIN CALCIUM 10 MG PO TABS
10.0000 mg | ORAL_TABLET | Freq: Every day | ORAL | Status: DC
  Administered 2019-12-22 – 2019-12-24 (×3): 10 mg via ORAL
  Filled 2019-12-21 (×3): qty 1

## 2019-12-21 MED ORDER — ENOXAPARIN SODIUM 40 MG/0.4ML ~~LOC~~ SOLN
40.0000 mg | SUBCUTANEOUS | Status: DC
  Administered 2019-12-21 – 2019-12-22 (×2): 40 mg via SUBCUTANEOUS
  Filled 2019-12-21 (×2): qty 0.4

## 2019-12-21 MED ORDER — DILTIAZEM HCL 60 MG PO TABS
30.0000 mg | ORAL_TABLET | Freq: Once | ORAL | Status: AC
  Administered 2019-12-21: 30 mg via ORAL
  Filled 2019-12-21: qty 1

## 2019-12-21 MED ORDER — ONDANSETRON HCL 4 MG/2ML IJ SOLN: 4.0000 mg | Freq: Four times a day (QID) | INTRAMUSCULAR | Status: DC | PRN

## 2019-12-21 MED ORDER — SODIUM CHLORIDE 0.9 % IV SOLN: 250.0000 mL | INTRAVENOUS | Status: DC | PRN

## 2019-12-21 MED ORDER — SODIUM CHLORIDE 0.9% FLUSH: 3.0000 mL | INTRAVENOUS | Status: DC | PRN

## 2019-12-21 MED ORDER — DOCUSATE SODIUM 50 MG/5ML PO LIQD
50.0000 mg | Freq: Every day | ORAL | Status: DC | PRN
  Filled 2019-12-21: qty 10

## 2019-12-21 MED ORDER — FUROSEMIDE 10 MG/ML IJ SOLN: 60.0000 mg | Freq: Every day | INTRAMUSCULAR | Status: DC

## 2019-12-21 MED ORDER — TAMSULOSIN HCL 0.4 MG PO CAPS
0.4000 mg | ORAL_CAPSULE | Freq: Every day | ORAL | Status: DC
  Administered 2019-12-21 – 2019-12-25 (×5): 0.4 mg via ORAL
  Filled 2019-12-21 (×5): qty 1

## 2019-12-21 MED ORDER — HYDROCODONE-ACETAMINOPHEN 10-325 MG PO TABS: 1.0000 | ORAL_TABLET | Freq: Four times a day (QID) | ORAL | Status: DC | PRN

## 2019-12-21 MED ORDER — DILTIAZEM HCL 25 MG/5ML IV SOLN
5.0000 mg | Freq: Once | INTRAVENOUS | Status: AC
  Administered 2019-12-21: 5 mg via INTRAVENOUS
  Filled 2019-12-21: qty 5

## 2019-12-21 MED ORDER — FUROSEMIDE 10 MG/ML IJ SOLN
60.0000 mg | Freq: Every day | INTRAMUSCULAR | Status: DC
  Administered 2019-12-21 – 2019-12-23 (×3): 60 mg via INTRAVENOUS
  Filled 2019-12-21 (×2): qty 8
  Filled 2019-12-21: qty 6

## 2019-12-21 MED ORDER — INSULIN ASPART 100 UNIT/ML ~~LOC~~ SOLN: 0.0000 [IU] | Freq: Every day | SUBCUTANEOUS | Status: DC

## 2019-12-21 MED ORDER — PANTOPRAZOLE SODIUM 40 MG PO TBEC
40.0000 mg | DELAYED_RELEASE_TABLET | Freq: Every day | ORAL | Status: DC
  Administered 2019-12-21 – 2019-12-25 (×5): 40 mg via ORAL
  Filled 2019-12-21 (×5): qty 1

## 2019-12-21 NOTE — H&P (Signed)
History and Physical   Billy Franco WYO:[id number] DOB: 01/20/1941 DOA: 12/21/2019  PCP: Steele Sizer, MD  Outpatient Specialists: none Patient coming from: home via private vehicle  I have personally briefly reviewed patient's old medical records in [place].  Chief Concern: Shortness of breath and leg swelling  HPI: Billy Franco is a 79 y.o. male with medical history significant for chronic back pain, hyperlipidemia, non-insulin-dependent diabetes mellitus,  Presented to PCP for chief concerns of  shortness of breath with with exertion for about 1 month and then bilateral lower extremity swelling that started 12/16/19.  At PCPs office he was found to be in A. fib with RVR and was sent to the emergency department.  Additionally, he reports that two weeks ago he had constipation and abdominal pressured pain (peraumbilical) and burning and pain when he urinated.  He denies hematuria and blood in stool.  ROS was negative for headache, vision changes, cough, nausea, vomiting, dysphagia, ondynophagia, fever, chills. He endorses baseline white phlegm productive in the AM.  ED Course: Discussed with ED provider, requesting admission for suspicion of new heart failure onset and A. fib with RVR.  Review of Systems: As per HPI otherwise 10 point review of systems negative.  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 per ED with improvement of A. Fib -Patient denies chest pain, no ST changes on EKG, no clinical suspicion for ACS at this time -Furosemide 60 mg IV daily -Checking TSH -Complete echo -Strict I's and O's -Daily weights -Primary team to consult cardiology if indicated  Non-insulin-dependent diabetes mellitus -Holding home Metformin due to acute kidney injury -Insulin SSI -Checking A1c  Dysuria-check UA with reflex microscopy  Mild normocytic normochromic anemia-suspect secondary to chronic disease -No clinical suspicion for bleeding at this time -Outpatient work-up  Chronic low back pain-resumed home Norco 10 tablet p.o. every 6 hours as needed for moderate pain  BPH-resumed home Flomax  As needed medications: Acetaminophen and ondansetron  Chart reviewed.   DVT prophylaxis: Enoxaparin Code Status: Full code Diet: Heart healthy/carb modified Family Communication: Updated spouse at bedside Disposition Plan: Pending clinical course Consults called: none at this time Admission status: Observation with telemetry  Past Medical History:  Diagnosis Date  . Arthritis   . Diabetes mellitus without complication ([place])   . Nephrolithiasis    Past Surgical History:  Procedure Laterality Date  . NASAL FRACTURE SURGERY  1994  . URETEROLITHOTOMY Right 2015   Social History:  reports that he has never smoked. He has never used smokeless tobacco. He reports that he does not drink alcohol and does not use drugs.  Allergies  Allergen Reactions  . Codeine Rash and Other (See Comments)    Per pt "hard on my kidneys"  Per pt "hard on my kidneys"  Per pt "hard on my kidneys"   . Lovastatin Rash and Other (See Comments)   Family History  Problem Relation Age of Onset  .  Heart disease Father    Family history: Family history reviewed and not pertinent  Prior to Admission medications   Medication Sig Start Date End Date Taking? Authorizing Provider  atorvastatin  (LIPITOR) 10 MG tablet Take 1 tablet (10 mg total) by mouth daily at 6 PM. 09/19/19   Ancil Boozer, Drue Stager, MD  BLOOD GLUCOSE MONITORING SUPPL ONETOUCH ULTRA (Device) - Historical Medication Active    [provider]  docusate sodium (COLACE) 50 MG capsule Take 50 mg by mouth daily as needed for mild constipation.     [provider]  glucose blood (ONE TOUCH ULTRA TEST) test strip Use as directed to check BG once daily. 01/24/19   Steele Sizer, MD  HYDROcodone-acetaminophen (NORCO) 10-325 MG tablet Take 1 tablet by mouth 4 (four) times daily as needed. 12/13/19   [provider]  ibuprofen (ADVIL) 200 MG tablet Take 200 mg by mouth every 6 (six) hours as needed.    [provider]  metFORMIN (GLUCOPHAGE XR) 500 MG 24 hr tablet Take 1 tablet (500 mg total) by mouth daily with breakfast. 09/19/19   Steele Sizer, MD  MULTIPLE VITAMIN PO Take by mouth.    [provider]  Nutritional Supplements (THERALITH XR) TABS Take 2 tablets by mouth daily.     [provider]  omeprazole (PRILOSEC) 20 MG capsule TAKE 1 CAPSULE (20 MG TOTAL) BY MOUTH EVERY OTHER DAY. 07/07/19   Steele Sizer, MD  OneTouch Delica Lancets 62M MISC 1 each by Subdermal route daily. as directed 09/13/19   Steele Sizer, MD  sildenafil (VIAGRA) 100 MG tablet Take 100 mg by mouth daily as needed. 08/08/19   Royston Cowper, MD  tamsulosin (FLOMAX) 0.4 MG CAPS capsule Take 1 capsule by mouth daily.    Jolaine Click, MD  triamcinolone cream (KENALOG) 0.1 % Apply 1 application topically 2 (two) times daily. 01/04/18   Steele Sizer, MD   Physical Exam: Vitals:   12/21/19 1800 12/21/19 1908 12/21/19 1930 12/21/19 2000  BP: 120/70 109/84 105/84 109/82  Pulse: 96 (!) 134 99 (!) 114  Resp: 19 (!) 26 (!) 22 (!) 23  Temp:      TempSrc:      SpO2: 100% 100% 100% 99%  Weight:      Height:       Constitutional: appears age-appropriate, NAD, calm, comfortable Eyes: PERRL, lids and  conjunctivae normal ENMT: Mucous membranes are moist. Posterior pharynx clear of any exudate or lesions. Age-appropriate dentition. Hearing appropriate Neck: normal, supple, no masses, no thyromegaly Respiratory: clear to auscultation bilaterally, no wheezing, no crackles. Normal respiratory effort. No accessory muscle use.  Cardiovascular: Regular rate and rhythm, no murmurs / rubs / gallops.  Bilateral lower extremity edema 1+. 2+ pedal pulses. No carotid bruits.  Abdomen: no tenderness, no masses palpated, no hepatosplenomegaly. Bowel sounds positive.  Musculoskeletal: no clubbing / cyanosis. No joint deformity upper and lower extremities. Good ROM, no contractures, no atrophy. Normal muscle tone.  Skin: no rashes, lesions, ulcers. No induration Neurologic: Sensation intact. Strength 5/5 in all 4.  Psychiatric: Normal judgment and insight. Alert and oriented x 3. Normal mood.   EKG: Independently reviewed, showing atrial fibrillation with RVR, rate of 130, QTc 479.  No previous EKG with A. fib on file  Chest x-ray on Admission: Personally reviewed and I agree with radiologist reading as below.  DG Chest 2 View  Result Date: 12/21/2019 CLINICAL DATA:  Bilateral lower extremity swelling, shortness of breath. EXAM: CHEST -  2 VIEW COMPARISON:  February 26, 2015. FINDINGS: Stable cardiomediastinal silhouette. Both lungs are clear. No pneumothorax or pleural effusion is noted. The visualized skeletal structures are unremarkable. IMPRESSION: No active cardiopulmonary disease. Electronically Signed   By: Marijo Conception M.D.   On: 12/21/2019 12:45   Labs on Admission: I have personally reviewed following labs  CBC: Recent Labs  Lab 12/21/19 1218  WBC 6.1  HGB 12.0*  HCT 37.6*  MCV 96.9  PLT 267   Basic Metabolic Panel: Recent Labs  Lab 12/21/19 1218  NA 139  K 4.9  CL 103  CO2 24  GLUCOSE 123*  BUN 19  CREATININE 1.48*  CALCIUM 9.8   Urine analysis:    Component Value  Date/Time   COLORURINE YELLOW (A) 05/02/2019 0852   APPEARANCEUR CLEAR (A) 05/02/2019 0852   LABSPEC 1.016 05/02/2019 0852   PHURINE 5.0 05/02/2019 0852   GLUCOSEU NEGATIVE 05/02/2019 0852   HGBUR MODERATE (A) 05/02/2019 0852   BILIRUBINUR NEGATIVE 05/02/2019 0852   KETONESUR NEGATIVE 05/02/2019 0852   PROTEINUR NEGATIVE 05/02/2019 0852   UROBILINOGEN 0.2 04/16/2008 0202   NITRITE POSITIVE (A) 05/02/2019 0852   LEUKOCYTESUR TRACE (A) 05/02/2019 0852   Brix Brearley N Lateshia Schmoker D.O. [facility]  If 12AM-7AM, please contact overnight-coverage provider If 7AM-7PM, please contact day coverage provider [email or web address]  12/21/2019, 9:54 PM

## 2019-12-21 NOTE — ED Notes (Signed)
Pt sitting on side of bed, offered [place] sandwich, states that he doesn't eat [place], wife states that she will go get him some food. Pt with no c/o at this time. Pt and wife given glasses of coffee and informed that to use caution, was hot. Verbalized understanding.

## 2019-12-21 NOTE — H&P (Signed)
History and Physical   Nemiah Kissner LGX:[id number] DOB: 01-11-1941 DOA: 12/21/2019  PCP: Steele Sizer, MD  Outpatient Specialists: none Patient coming from: home via private vehicle  I have personally briefly reviewed patient's old medical records in [place].  Chief Concern: Shortness of breath and leg swelling  HPI: Billy Franco is a 79 y.o. male with medical history significant for chronic back pain, hyperlipidemia, non-insulin-dependent diabetes mellitus,  Presented to PCP for chief concerns of  shortness of breath with with exertion for about 1 month and then bilateral lower extremity swelling that started 12/16/19.  At PCPs office he was found to be in A. fib with RVR and was sent to the emergency department.  Additionally, he reports that two weeks ago he had constipation and abdominal pressured pain (peraumbilical) and burning and pain when he urinated.  He denies hematuria and blood in stool.  ROS was negative for headache, vision changes, cough, nausea, vomiting, dysphagia, ondynophagia, fever, chills. He endorses baseline white phlegm productive in the AM.  ED Course: Discussed with ED provider, requesting admission for suspicion of new heart failure onset and A. fib with RVR.  Review of Systems: As per HPI otherwise 10 point review of systems negative.  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 per ED with improvement of A. Fib -Patient denies chest pain, no ST changes on EKG, no clinical suspicion for ACS at this time -Furosemide 60 mg IV daily -Checking TSH -Complete echo -Strict I's and O's -Daily weights -Primary team to consult cardiology if indicated  Acute on chronic kidney disease, stage II -suspect secondary to cardiorenal versus UTI -UA ordered -Echo -Lasix -BMP in the a.m.  Non-insulin-dependent diabetes mellitus -Holding home Metformin due to acute kidney injury -Insulin SSI -Checking A1c  Mild normocytic normochromic anemia-suspect secondary to chronic disease -Outpatient work-up  Chronic low back pain-resumed home Norco 10 tablet p.o. every 6 hours as needed for moderate pain  BPH-resumed home Flomax  As needed medications: Acetaminophen and ondansetron  Chart reviewed.   DVT prophylaxis: Enoxaparin Code Status: Full code Diet: Heart healthy/carb modified Family Communication: Updated spouse at bedside Disposition Plan: Pending clinical course Consults called: none at this time Admission status: Observation with telemetry  Past Medical History:  Diagnosis Date  . Arthritis   . Diabetes mellitus without complication ([place])   . Nephrolithiasis    Past Surgical History:  Procedure Laterality Date  . NASAL FRACTURE SURGERY  1994  . URETEROLITHOTOMY Right 2015   Social History:  reports that he has never smoked. He has never used smokeless tobacco. He reports that he does not drink alcohol and does not use drugs.  Allergies  Allergen Reactions  . Codeine Rash and Other (See Comments)    Per pt "hard on my kidneys"  Per pt "hard on my kidneys"  Per pt "hard on my kidneys"   . Lovastatin Rash and Other (See Comments)   Family History  Problem Relation Age of Onset  . Heart disease Father    Family history: Family history reviewed and not pertinent  Prior to Admission medications   Medication Sig Start Date End Date Taking?  Authorizing Provider  atorvastatin (LIPITOR) 10 MG tablet Take 1 tablet (10 mg total) by mouth daily at 6 PM. 09/19/19   Ancil Boozer, Drue Stager, MD  BLOOD GLUCOSE MONITORING SUPPL ONETOUCH ULTRA (Device) - Historical Medication Active    [provider]  docusate sodium (COLACE) 50 MG capsule Take 50 mg by mouth daily as needed for mild constipation.     [provider]  glucose blood (ONE TOUCH ULTRA TEST) test strip Use as directed to check BG once daily. 01/24/19   Steele Sizer, MD  HYDROcodone-acetaminophen (NORCO) 10-325 MG tablet Take 1 tablet by mouth 4 (four) times daily as needed. 12/13/19   [provider]  ibuprofen (ADVIL) 200 MG tablet Take 200 mg by mouth every 6 (six) hours as needed.    [provider]  metFORMIN (GLUCOPHAGE XR) 500 MG 24 hr tablet Take 1 tablet (500 mg total) by mouth daily with breakfast. 09/19/19   Steele Sizer, MD  MULTIPLE VITAMIN PO Take by mouth.    [provider]  Nutritional Supplements (THERALITH XR) TABS Take 2 tablets by mouth daily.     [provider]  omeprazole (PRILOSEC) 20 MG capsule TAKE 1 CAPSULE (20 MG TOTAL) BY MOUTH EVERY OTHER DAY. 07/07/19   Steele Sizer, MD  OneTouch Delica Lancets 40G MISC 1 each by Subdermal route daily. as directed 09/13/19   Steele Sizer, MD  sildenafil (VIAGRA) 100 MG tablet Take 100 mg by mouth daily as needed. 08/08/19   Royston Cowper, MD  tamsulosin (FLOMAX) 0.4 MG CAPS capsule Take 1 capsule by mouth daily.    Jolaine Click, MD  triamcinolone cream (KENALOG) 0.1 % Apply 1 application topically 2 (two) times daily. 01/04/18   Steele Sizer, MD   Physical Exam: Vitals:   12/21/19 1800 12/21/19 1908 12/21/19 1930 12/21/19 2000  BP: 120/70 109/84 105/84 109/82  Pulse: 96 (!) 134 99 (!) 114  Resp: 19 (!) 26 (!) 22 (!) 23  Temp:      TempSrc:      SpO2: 100% 100% 100% 99%  Weight:      Height:       Constitutional: appears age-appropriate, NAD, calm,  comfortable Eyes: PERRL, lids and conjunctivae normal ENMT: Mucous membranes are moist. Posterior pharynx clear of any exudate or lesions. Age-appropriate dentition. Hearing appropriate Neck: normal, supple, no masses, no thyromegaly Respiratory: clear to auscultation bilaterally, no wheezing, no crackles. Normal respiratory effort. No accessory muscle use.  Cardiovascular: Regular rate and rhythm, no murmurs / rubs / gallops.  Bilateral lower extremity edema 1+. 2+ pedal pulses. No carotid bruits.  Abdomen: no tenderness, no masses palpated, no hepatosplenomegaly. Bowel sounds positive.  Musculoskeletal: no clubbing / cyanosis. No joint deformity upper and lower extremities. Good ROM, no contractures, no atrophy. Normal muscle tone.  Skin: no rashes, lesions, ulcers. No induration Neurologic: Sensation intact. Strength 5/5 in all 4.  Psychiatric: Normal judgment and insight. Alert and oriented x 3. Normal mood.   EKG: Independently reviewed, showing atrial fibrillation with RVR, rate of 130, QTc 479.  No previous EKG with A. fib on file  Chest x-ray on Admission: Personally reviewed and I agree with radiologist reading as below.  DG Chest 2 View  Result Date: 12/21/2019 CLINICAL DATA:  Bilateral lower extremity  swelling, shortness of breath. EXAM: CHEST - 2 VIEW COMPARISON:  February 26, 2015. FINDINGS: Stable cardiomediastinal silhouette. Both lungs are clear. No pneumothorax or pleural effusion is noted. The visualized skeletal structures are unremarkable. IMPRESSION: No active cardiopulmonary disease. Electronically Signed   By: Marijo Conception M.D.   On: 12/21/2019 12:45   Labs on Admission: I have personally reviewed following labs  CBC: Recent Labs  Lab 12/21/19 1218  WBC 6.1  HGB 12.0*  HCT 37.6*  MCV 96.9  PLT 150   Basic Metabolic Panel: Recent Labs  Lab 12/21/19 1218  NA 139  K 4.9  CL 103  CO2 24  GLUCOSE 123*  BUN 19  CREATININE 1.48*  CALCIUM 9.8   Urine  analysis:    Component Value Date/Time   COLORURINE YELLOW (A) 05/02/2019 0852   APPEARANCEUR CLEAR (A) 05/02/2019 0852   LABSPEC 1.016 05/02/2019 0852   PHURINE 5.0 05/02/2019 0852   GLUCOSEU NEGATIVE 05/02/2019 0852   HGBUR MODERATE (A) 05/02/2019 0852   BILIRUBINUR NEGATIVE 05/02/2019 0852   KETONESUR NEGATIVE 05/02/2019 0852   PROTEINUR NEGATIVE 05/02/2019 0852   UROBILINOGEN 0.2 04/16/2008 0202   NITRITE POSITIVE (A) 05/02/2019 0852   LEUKOCYTESUR TRACE (A) 05/02/2019 0852   Tesla Bochicchio N Ipek Westra D.O. [facility]  If 12AM-7AM, please contact overnight-coverage provider If 7AM-7PM, please contact day coverage provider [email or web address]  12/21/2019, 9:54 PM
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
## 2019-12-21 NOTE — ED Triage Notes (Signed)
C/O bilateral leg swelling x 1 week.  Went to PCP, who sent patient to ED due to elevated HR.  Patient also c/o SOB x 1-2 months.  Patient AAOx3.  Skin warm and dry.  No SOB/ DOE.  NAD

## 2019-12-21 NOTE — Patient Instructions (Signed)
The EKG showed atrial fibrillation with a rapid ventricular response rate  You need to proceed to the emergency room for further management immediately, and your wife who is here today can take you there given its very close proximity.

## 2019-12-21 NOTE — ED Notes (Signed)
ED Provider at bedside. 
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
## 2019-12-22 ENCOUNTER — Observation Stay
Admit: 2019-12-22 | Discharge: 2019-12-22 | Disposition: A | Discharge status: Home / Self Care | Payer: Medicare Other | Attending: Internal Medicine | Admitting: Internal Medicine

## 2019-12-22 DIAGNOSIS — I959 Hypotension, unspecified: Secondary | ICD-10-CM | POA: Diagnosis present

## 2019-12-22 DIAGNOSIS — E785 Hyperlipidemia, unspecified: Secondary | ICD-10-CM | POA: Diagnosis present

## 2019-12-22 DIAGNOSIS — I10 Essential (primary) hypertension: Secondary | ICD-10-CM

## 2019-12-22 DIAGNOSIS — N400 Enlarged prostate without lower urinary tract symptoms: Secondary | ICD-10-CM | POA: Diagnosis present

## 2019-12-22 DIAGNOSIS — Z888 Allergy status to other drugs, medicaments and biological substances status: Secondary | ICD-10-CM | POA: Diagnosis not present

## 2019-12-22 DIAGNOSIS — N182 Chronic kidney disease, stage 2 (mild): Secondary | ICD-10-CM | POA: Diagnosis present

## 2019-12-22 DIAGNOSIS — I4892 Unspecified atrial flutter: Secondary | ICD-10-CM | POA: Diagnosis not present

## 2019-12-22 DIAGNOSIS — G8929 Other chronic pain: Secondary | ICD-10-CM | POA: Diagnosis not present

## 2019-12-22 DIAGNOSIS — Z20822 Contact with and (suspected) exposure to covid-19: Secondary | ICD-10-CM | POA: Diagnosis present

## 2019-12-22 DIAGNOSIS — I700 Atherosclerosis of aorta: Secondary | ICD-10-CM | POA: Diagnosis present

## 2019-12-22 DIAGNOSIS — I480 Paroxysmal atrial fibrillation: Secondary | ICD-10-CM | POA: Diagnosis not present

## 2019-12-22 DIAGNOSIS — N179 Acute kidney failure, unspecified: Secondary | ICD-10-CM | POA: Diagnosis present

## 2019-12-22 DIAGNOSIS — E1169 Type 2 diabetes mellitus with other specified complication: Secondary | ICD-10-CM | POA: Diagnosis present

## 2019-12-22 DIAGNOSIS — I130 Hypertensive heart and chronic kidney disease with heart failure and stage 1 through stage 4 chronic kidney disease, or unspecified chronic kidney disease: Secondary | ICD-10-CM | POA: Diagnosis present

## 2019-12-22 DIAGNOSIS — D631 Anemia in chronic kidney disease: Secondary | ICD-10-CM | POA: Diagnosis present

## 2019-12-22 DIAGNOSIS — R300 Dysuria: Secondary | ICD-10-CM | POA: Diagnosis present

## 2019-12-22 DIAGNOSIS — Z8249 Family history of ischemic heart disease and other diseases of the circulatory system: Secondary | ICD-10-CM | POA: Diagnosis not present

## 2019-12-22 DIAGNOSIS — I4891 Unspecified atrial fibrillation: Secondary | ICD-10-CM | POA: Diagnosis present

## 2019-12-22 DIAGNOSIS — Z7984 Long term (current) use of oral hypoglycemic drugs: Secondary | ICD-10-CM | POA: Diagnosis not present

## 2019-12-22 DIAGNOSIS — G894 Chronic pain syndrome: Secondary | ICD-10-CM | POA: Diagnosis present

## 2019-12-22 DIAGNOSIS — Z79899 Other long term (current) drug therapy: Secondary | ICD-10-CM | POA: Diagnosis not present

## 2019-12-22 DIAGNOSIS — I248 Other forms of acute ischemic heart disease: Secondary | ICD-10-CM | POA: Diagnosis present

## 2019-12-22 DIAGNOSIS — Z87442 Personal history of urinary calculi: Secondary | ICD-10-CM | POA: Diagnosis not present

## 2019-12-22 DIAGNOSIS — I5022 Chronic systolic (congestive) heart failure: Secondary | ICD-10-CM | POA: Diagnosis not present

## 2019-12-22 DIAGNOSIS — M545 Low back pain, unspecified: Secondary | ICD-10-CM | POA: Diagnosis present

## 2019-12-22 DIAGNOSIS — E1122 Type 2 diabetes mellitus with diabetic chronic kidney disease: Secondary | ICD-10-CM | POA: Diagnosis present

## 2019-12-22 DIAGNOSIS — K219 Gastro-esophageal reflux disease without esophagitis: Secondary | ICD-10-CM | POA: Diagnosis present

## 2019-12-22 DIAGNOSIS — R0600 Dyspnea, unspecified: Secondary | ICD-10-CM | POA: Diagnosis not present

## 2019-12-22 DIAGNOSIS — I5023 Acute on chronic systolic (congestive) heart failure: Secondary | ICD-10-CM | POA: Diagnosis present

## 2019-12-22 DIAGNOSIS — Z885 Allergy status to narcotic agent status: Secondary | ICD-10-CM | POA: Diagnosis not present

## 2019-12-22 LAB — BASIC METABOLIC PANEL
Anion gap: 13 (ref 5–15)
BUN: 18 mg/dL (ref 8–23)
CO2: 25 mmol/L (ref 22–32)
Calcium: 9.6 mg/dL (ref 8.9–10.3)
Chloride: 99 mmol/L (ref 98–111)
Creatinine, Ser: 1.53 mg/dL — ABNORMAL HIGH (ref 0.61–1.24)
GFR, Estimated: 46 mL/min — ABNORMAL LOW (ref >60)
Glucose, Bld: 137 mg/dL — ABNORMAL HIGH (ref 70–99)
Potassium: 3.7 mmol/L (ref 3.5–5.1)
Sodium: 137 mmol/L (ref 135–145)

## 2019-12-22 LAB — URINALYSIS, ROUTINE W REFLEX MICROSCOPIC
Bilirubin Urine: NEGATIVE
Glucose, UA: NEGATIVE mg/dL
Hgb urine dipstick: NEGATIVE
Ketones, ur: NEGATIVE mg/dL
Leukocytes,Ua: NEGATIVE
Nitrite: NEGATIVE
Protein, ur: NEGATIVE mg/dL
Specific Gravity, Urine: 1.004 — ABNORMAL LOW (ref 1.005–1.030)
pH: 7 (ref 5.0–8.0)

## 2019-12-22 LAB — ECHOCARDIOGRAM COMPLETE
AR max vel: 1.68 cm2
AV Area VTI: 1.65 cm2
AV Area mean vel: 1.43 cm2
AV Mean grad: 4 mmHg
AV Peak grad: 7 mmHg
Ao pk vel: 1.32 m/s
Area-P 1/2: 4.09 cm2
Height: 67 in
S' Lateral: 4.82 cm
Weight: 2716.07 oz

## 2019-12-22 LAB — CBG MONITORING, ED
Glucose-Capillary: 139 mg/dL — ABNORMAL HIGH (ref 70–99)
Glucose-Capillary: 144 mg/dL — ABNORMAL HIGH (ref 70–99)
Glucose-Capillary: 162 mg/dL — ABNORMAL HIGH (ref 70–99)
Glucose-Capillary: 77 mg/dL (ref 70–99)

## 2019-12-22 LAB — HEMOGLOBIN A1C
Hgb A1c MFr Bld: 7 % — ABNORMAL HIGH (ref 4.8–5.6)
Mean Plasma Glucose: 154.2 mg/dL

## 2019-12-22 LAB — GLUCOSE, CAPILLARY: Glucose-Capillary: 135 mg/dL — ABNORMAL HIGH (ref 70–99)

## 2019-12-22 LAB — TSH: TSH: 8.252 u[IU]/mL — ABNORMAL HIGH (ref 0.350–4.500)

## 2019-12-22 MED ORDER — DIGOXIN 0.25 MG/ML IJ SOLN
0.2500 mg | Freq: Once | INTRAMUSCULAR | Status: AC
  Administered 2019-12-22: 0.25 mg via INTRAVENOUS
  Filled 2019-12-22 (×2): qty 2

## 2019-12-22 MED ORDER — CARVEDILOL 3.125 MG PO TABS: 3.1250 mg | ORAL_TABLET | Freq: Two times a day (BID) | ORAL | Status: DC

## 2019-12-22 MED ORDER — SACUBITRIL-VALSARTAN 49-51 MG PO TABS: 0.5000 | ORAL_TABLET | Freq: Two times a day (BID) | ORAL | Status: DC

## 2019-12-22 MED ORDER — DILTIAZEM HCL 30 MG PO TABS
30.0000 mg | ORAL_TABLET | Freq: Four times a day (QID) | ORAL | Status: DC
  Administered 2019-12-22: 30 mg via ORAL
  Filled 2019-12-22 (×4): qty 1

## 2019-12-22 MED ORDER — DIGOXIN 0.25 MG/ML IJ SOLN
0.2500 mg | Freq: Once | INTRAMUSCULAR | Status: DC
  Filled 2019-12-22: qty 2

## 2019-12-22 MED ORDER — POTASSIUM CHLORIDE CRYS ER 20 MEQ PO TBCR
20.0000 meq | EXTENDED_RELEASE_TABLET | Freq: Every day | ORAL | Status: DC
  Administered 2019-12-22 – 2019-12-25 (×4): 20 meq via ORAL
  Filled 2019-12-22 (×4): qty 1

## 2019-12-22 MED ORDER — DIGOXIN 0.25 MG/ML IJ SOLN
0.5000 mg | Freq: Once | INTRAMUSCULAR | Status: DC
  Filled 2019-12-22: qty 2

## 2019-12-22 NOTE — Progress Notes (Signed)
*  PRELIMINARY RESULTS* Echocardiogram 2D Echocardiogram has been performed.  Billy Franco 12/22/2019, 10:47 AM

## 2019-12-22 NOTE — ED Notes (Addendum)
PT stretching on side of bed. NAD at this time. Menu provided for breakfast options.

## 2019-12-22 NOTE — ED Notes (Signed)
Patient encouraged to lay back on the stretcher and not sit on the side to improve blood pressure. Patient prefers to sit on the side of the stretcher. Billy Fredrickson RN messaged Dr. Louanne Belton with concerns about the patient's tachycardia and hypotension who is referring it to cardiology.

## 2019-12-22 NOTE — ED Notes (Signed)
Pt awake with wife at bedside reporting he is unable to sleep with leg cramps.

## 2019-12-22 NOTE — Progress Notes (Addendum)
PROGRESS NOTE  Billy Franco PIR:[id number] DOB: Feb 25, 1940 DOA: 12/21/2019 PCP: Steele Sizer, MD   LOS: 0 days   Brief narrative: As per HPI,   Assessment/Plan:  Principal Problem:   Afib ([place]) Active Problems:   Dyslipidemia associated with type 2 diabetes mellitus ([place])   GERD (gastroesophageal reflux disease)   Chronic low back pain   Type 2 diabetes mellitus with other specified complication (HCC)   Chronic pain syndrome   Essential hypertension   Elevated troponin   AKI (acute kidney injury) ([place])   CKD (chronic kidney disease), stage II  Atrial fibrillation with RVR Still RVR.Marland Kitchen  Patient received 1 dose of IV Cardizem and p.o. x1.  Will resume p.o. again this morning.  Check 2D echocardiogram.  TSH elevated at 8.2.  We will add T4.  Denies any chest pain.  EKG without any acute changes except for A. fib.  Patient received Lasix 60 mg in the ED with improvement in breathing.  Strict intake and output charting.  Daily weights.  Will consult cardiology due to decompensated heart failure with A. fib RVR.  Possible congestive heart failure likely exacerbated by atrial fibrillation with RVR.  Will check 2D echocardiogram.  Received 60 mg of Lasix with improvement in breathing.  Will closely monitor.  Cardiology will be consulted.  Continue fluid restriction, intake output charting, low-salt diet, daily weights.  Respiratory panel was negative.  BNP elevated at 564.  Chest x-ray did not show any acute findings.  On IV Lasix of 60 mg daily.  Possible mild acute acute on chronic kidney disease, stage II  Creatinine 09/19/2019 at 1.08.  Creatinine on presentation at 1.4.  Urinalysis is bland.  Continue Lasix.  Closely monitor renal function.  Type II diabetes mellitus Continue sliding scale insulin.  A1c of 7.0.  A1c 3 months back was 6.4.  Continue to monitor closely.  Diabetic diet.    Chronic low back pain-on Norco at home.  BPH-continue Flomax  DVT prophylaxis: enoxaparin  (LOVENOX) injection 40 mg Start: 12/21/19 2200 Place TED hose Start: 12/21/19 2148   Code Status: Full code  Family Communication: Spoke with the patient's family at bedside  Status is: Observation  The patient will require care spanning > 2 midnights and should be moved to inpatient because: IV treatments appropriate due to intensity of illness or inability to take PO, Inpatient level of care appropriate due to severity of illness and Decompensated heart failure, atrial fibrillation with RVR, cardiology consultation.  Dispo: The patient is from: Home              Anticipated d/c is to: Home              Anticipated d/c date is: 2 days              Patient currently is not medically stable to d/c.   Consultants:  Cardiology  Procedures:  None  Antibiotics:  . None  Anti-infectives (From admission, onward)   None     Subjective: Today, patient was seen and examined at bedside.  Patient denies any chest pain, palpitation but has mild shortness of breath.  Feels little better after Lasix.  Denies dizziness lightheadedness or chest pain.  Telemetry monitor showing heartbeat ranging from 140-160.  Objective: Vitals:   12/22/19 1000 12/22/19 1100  BP: (!) 103/53 109/88  Pulse: (!) 56 (!) 131  Resp: (!) 23 18  Temp:    SpO2: 100% 99%    Intake/Output Summary (Last 24 hours)  at 12/22/2019 1207 Last data filed at 12/21/2019 2227 Gross per 24 hour  Intake --  Output 350 ml  Net -350 ml   Filed Weights   12/21/19 1204  Weight: 77 kg   Body mass index is 26.59 kg/m.   Physical Exam: GENERAL: Patient is alert awake and oriented. Not in obvious distress. HENT: No scleral pallor or icterus. Pupils equally reactive to light. Oral mucosa is moist NECK: is supple, no gross swelling noted. CHEST:  Diminished breath sounds bilaterally. CVS: S1 and S2 heard, no murmur.  Irregularly irregular rhythm. ABDOMEN: Soft, non-tender, bowel sounds are present. EXTREMITIES:  Bilateral lower extremity edema noted. CNS: Cranial nerves are intact. No focal motor deficits. SKIN: warm and dry without rashes.  Data Review: I have personally reviewed the following laboratory data and studies,  CBC: Recent Labs  Lab 12/21/19 1218  WBC 6.1  HGB 12.0*  HCT 37.6*  MCV 96.9  PLT 914   Basic Metabolic Panel: Recent Labs  Lab 12/21/19 1218 12/22/19 0504  NA 139 137  K 4.9 3.7  CL 103 99  CO2 24 25  GLUCOSE 123* 137*  BUN 19 18  CREATININE 1.48* 1.53*  CALCIUM 9.8 9.6   Liver Function Tests: No results for input(s): AST, ALT, ALKPHOS, BILITOT, PROT, ALBUMIN in the last 168 hours. No results for input(s): LIPASE, AMYLASE in the last 168 hours. No results for input(s): AMMONIA in the last 168 hours. Cardiac Enzymes: No results for input(s): CKTOTAL, CKMB, CKMBINDEX, TROPONINI in the last 168 hours. BNP (last 3 results) Recent Labs    12/21/19 1218  BNP 564.1*    ProBNP (last 3 results) No results for input(s): PROBNP in the last 8760 hours.  CBG: Recent Labs  Lab 12/22/19 0009 12/22/19 0944  GLUCAP 162* 139*   Recent Results (from the past 240 hour(s))  Resp Panel by RT-PCR (Flu A&B, Covid) Nasopharyngeal Swab     Status: None   Collection Time: 12/21/19  1:11 PM   Specimen: Nasopharyngeal Swab; Nasopharyngeal(NP) swabs in vial transport medium  Result Value Ref Range Status   SARS Coronavirus 2 by RT PCR NEGATIVE NEGATIVE Final    Comment: (NOTE) SARS-CoV-2 target nucleic acids are NOT DETECTED.  The SARS-CoV-2 RNA is generally detectable in upper respiratory specimens during the acute phase of infection. The lowest concentration of SARS-CoV-2 viral copies this assay can detect is 138 copies/mL. A negative result does not preclude SARS-Cov-2 infection and should not be used as the sole basis for treatment or other patient management decisions. A negative result may occur with  improper specimen collection/handling, submission of  specimen other than nasopharyngeal swab, presence of viral mutation(s) within the areas targeted by this assay, and inadequate number of viral copies(<138 copies/mL). A negative result must be combined with clinical observations, patient history, and epidemiological information. The expected result is Negative.  Fact Sheet for Patients:  EntrepreneurPulse.com.au  Fact Sheet for Healthcare Providers:  IncredibleEmployment.be  This test is no t yet approved or cleared by the [place] FDA and  has been authorized for detection and/or diagnosis of SARS-CoV-2 by FDA under an Emergency Use Authorization (EUA). This EUA will remain  in effect (meaning this test can be used) for the duration of the COVID-19 declaration under Section 564(b)(1) of the Act, 21 U.S.C.section 360bbb-3(b)(1), unless the authorization is terminated  or revoked sooner.       Influenza A by PCR NEGATIVE NEGATIVE Final   Influenza B by PCR NEGATIVE NEGATIVE  Final    Comment: (NOTE) The Xpert Xpress SARS-CoV-2/FLU/RSV plus assay is intended as an aid in the diagnosis of influenza from Nasopharyngeal swab specimens and should not be used as a sole basis for treatment. Nasal washings and aspirates are unacceptable for Xpert Xpress SARS-CoV-2/FLU/RSV testing.  Fact Sheet for Patients: EntrepreneurPulse.com.au  Fact Sheet for Healthcare Providers: IncredibleEmployment.be  This test is not yet approved or cleared by the [place] FDA and has been authorized for detection and/or diagnosis of SARS-CoV-2 by FDA under an Emergency Use Authorization (EUA). This EUA will remain in effect (meaning this test can be used) for the duration of the COVID-19 declaration under Section 564(b)(1) of the Act, 21 U.S.C. section 360bbb-3(b)(1), unless the authorization is terminated or revoked.  Performed at [facility], [address] [place], [place] [address]      Studies: DG Chest 2 View  Result Date: 12/21/2019 CLINICAL DATA:  Bilateral lower extremity swelling, shortness of breath. EXAM: CHEST - 2 VIEW COMPARISON:  February 26, 2015. FINDINGS: Stable cardiomediastinal silhouette. Both lungs are clear. No pneumothorax or pleural effusion is noted. The visualized skeletal structures are unremarkable. IMPRESSION: No active cardiopulmonary disease. Electronically Signed   By: Marijo Conception M.D.   On: 12/21/2019 12:45      Flora Lipps, MD  [facility] 12/22/2019

## 2019-12-22 NOTE — Progress Notes (Signed)
CARDIOLOGY CONSULT NOTE               Patient ID: Billy Franco MRN: [id number] DOB/AGE: 1940/10/12 79 y.o.  Admit date: 12/21/2019 Referring Physician: Flora Lipps, MD Primary Physician: Steele Sizer, MD  Primary Cardiologist: none  Reason for Consultation: Atrial fibrillation with RVR  HPI: Billy Franco is a 79 year old male with PMH significant for atherosclerosis of the aorta,  HLD, DM Type II, GERD, BPH, chronic osteoarthritis and chronic back pain who presented to [facility] ED for new onset atrial fibrillation with RVR.   ED course: The patient was sent to the ED from Internal Medicine at [facility] due to new onset atrial fibrillation with RVR confirmed by ECG. The patient's ECG in the Ed revealed atrial fib with HR of 130 bpm. The patient's BNP was elevated at 564.1, high sensitivity troponin was elevated at 19, creatinine was elevated at 1.48 and TSH elevated at 8.252. CXR revealed no active cardiopulmonary disease. The patient was given 5mg  of diltiazem IV last night and 30mg  of oral diltiazem on today with minimal controlled of HR. The patient presented with c/o dyspnea and BLE edema and heart failure was suspected; thus, Cardiology was consulted for further evalution.  The patient reports that his symptoms of dyspnea began a few months ago, however, the BLE edema started 6 days ago with worsening upon arrival. He denies having any aggravating factors or associated symptoms including cough, fever, chills, night sweats or palpitations. He also denies having any chest pain, orthopnea, syncope or dizziness. The patient denies having any previous cardiac history or any family history of MI, CHF or CVA. He reports that he has never been told that he had atrial fibrillation or any irregular heart rhythm.  The patient reports that he follows up with his PCP regularly and is compliant with all of his current medications. The patient is sitting on the side of the bed and is wife remains at the  bedside.   Review of systems complete and found to be negative unless listed above    Past Medical History:  Diagnosis Date  . Arthritis   . Diabetes mellitus without complication ([place])   . Nephrolithiasis     Past Surgical History:  Procedure Laterality Date  . NASAL FRACTURE SURGERY  1994  . URETEROLITHOTOMY Right 2015    (Not in a hospital admission)  Social History   Socioeconomic History  . Marital status: Married    Spouse name: Dennie Bible   . Number of children: 3  . Years of education: Not on file  . Highest education level: Not on file  Occupational History  . Occupation: retired     Comment: Radiographer, therapeutic at a local [place] Use  . Smoking status: Never Smoker  . Smokeless tobacco: Never Used  Vaping Use  . Vaping Use: Never used  Substance and Sexual Activity  . Alcohol use: No    Alcohol/week: 0.0 standard drinks  . Drug use: No  . Sexual activity: Not Currently  Other Topics Concern  . Not on file  Social History Narrative   Married, had 3 children, one died at a MVA at age 69   Pastor of a church    Social Determinants of Radio broadcast assistant Strain: Low Risk   . Difficulty of Paying Living Expenses: Not hard at all  Food Insecurity: No Food Insecurity  . Worried About Charity fundraiser in the Last Year: Never true  . Ran Out  of Food in the Last Year: Never true  Transportation Needs: No Transportation Needs  . Lack of Transportation (Medical): No  . Lack of Transportation (Non-Medical): No  Physical Activity: Sufficiently Active  . Days of Exercise per Week: 5 days  . Minutes of Exercise per Session: 60 min  Stress: No Stress Concern Present  . Feeling of Stress : Not at all  Social Connections: Socially Integrated  . Frequency of Communication with Friends and Family: More than three times a week  . Frequency of Social Gatherings with Friends and Family: More than three times a week  . Attends Religious Services: More than 4  times per year  . Active Member of Clubs or Organizations: Yes  . Attends Archivist Meetings: More than 4 times per year  . Marital Status: Married  Human resources officer Violence: Not At Risk  . Fear of Current or Ex-Partner: No  . Emotionally Abused: No  . Physically Abused: No  . Sexually Abused: No    Family History  Problem Relation Age of Onset  . Heart disease Father       Review of systems complete and found to be negative unless listed above   PHYSICAL EXAM  General: Well developed, well nourished, in no acute distress HEENT:  Normocephalic and atraumatic. PERRLA. Neck:  No JVD. +2 carotid pulses, negative for bruit.  Lungs: upper lobes clear bilaterally to auscultation, lower lobes sounds are diminished bilaterally to ausculation. Chest expansion is symmetrical. Normal effort of breathing noted.  Heart: irregular RR . Normal S1 and S2 without gallops or murmurs.  Abdomen:  abdomen soft and non-tender and non-distended. Msk:  Back normal. Normal strength and tone for age. Extremities: +2 pitting BLE edema. No clubbing or cyanosis.   Neuro: Alert and oriented X 3. Psych:  Good affect, responds appropriately  Labs:   Lab Results  Component Value Date   WBC 6.1 12/21/2019   HGB 12.0 (L) 12/21/2019   HCT 37.6 (L) 12/21/2019   MCV 96.9 12/21/2019   PLT 193 12/21/2019    Recent Labs  Lab 12/22/19 0504  NA 137  K 3.7  CL 99  CO2 25  BUN 18  CREATININE 1.53*  CALCIUM 9.6  GLUCOSE 137*   No results found for: CKTOTAL, CKMB, CKMBINDEX, TROPONINI  Lab Results  Component Value Date   CHOL 122 09/19/2019   CHOL 120 07/19/2018   CHOL 130 08/31/2017   Lab Results  Component Value Date   HDL 60 09/19/2019   HDL 56 07/19/2018   HDL 60 08/31/2017   Lab Results  Component Value Date   LDLCALC 48 09/19/2019   LDLCALC 50 07/19/2018   LDLCALC 56 08/31/2017   Lab Results  Component Value Date   TRIG 64 09/19/2019   TRIG 68 07/19/2018   TRIG 63  08/31/2017   Lab Results  Component Value Date   CHOLHDL 2.0 09/19/2019   CHOLHDL 2.1 07/19/2018   CHOLHDL 2.2 08/31/2017   No results found for: LDLDIRECT    Radiology: DG Chest 2 View  Result Date: 12/21/2019 CLINICAL DATA:  Bilateral lower extremity swelling, shortness of breath. EXAM: CHEST - 2 VIEW COMPARISON:  February 26, 2015. FINDINGS: Stable cardiomediastinal silhouette. Both lungs are clear. No pneumothorax or pleural effusion is noted. The visualized skeletal structures are unremarkable. IMPRESSION: No active cardiopulmonary disease. Electronically Signed   By: Marijo Conception M.D.   On: 12/21/2019 12:45   DG PAIN CLINIC C-ARM 1-60 MIN NO REPORT  Result Date: 11/28/2019 Fluoro was used, but no Radiologist interpretation will be provided. Please refer to "NOTES" tab for provider progress note.  ECHOCARDIOGRAM COMPLETE  Result Date: 12/22/2019    ECHOCARDIOGRAM REPORT   Patient Name:   ARMON ORVIS Date of Exam: 12/22/2019 Medical Rec #:  [id number]      Height:       67.0 in Accession #:    [id number]     Weight:       169.8 lb Date of Birth:  Apr 04, 1940      BSA:          1.886 m Patient Age:    73 years       BP:           104/67 mmHg Patient Gender: M              HR:           145 bpm. Exam Location:  [facility] Procedure: 2D Echo, Color Doppler and Cardiac Doppler Indications:     R06.00 Dyspnea  History:         Patient has no prior history of Echocardiogram examinations.                  Risk Factors:Diabetes.  Sonographer:     Charmayne Sheer RDCS (AE) Referring Phys:  [id number] AMY N COX Diagnosing Phys: Yolonda Kida MD  Sonographer Comments: Suboptimal apical window. IMPRESSIONS  1. Left ventricular ejection fraction, by estimation, is 20 to 25%. 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 The left ventricle demonstrates global hypokinesis. The left ventricular internal cavity size was mildly to moderately dilated. Left ventricular diastolic parameters were normal.  2. Right  ventricular systolic function is moderately reduced. The right ventricular size is moderately enlarged.  3. Left atrial size was mildly dilated.  4. Right atrial size was mildly dilated.  5. The mitral valve is grossly normal. Mild to moderate mitral valve regurgitation.  6. Tricuspid valve regurgitation is mild to moderate.  7. The aortic valve is grossly normal. Aortic valve regurgitation is not visualized. FINDINGS  Left Ventricle: Left ventricular ejection fraction, by estimation, is 20 to 25%. 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 The left ventricle demonstrates global hypokinesis. The left ventricular internal cavity size was mildly to moderately dilated. There is no left ventricular hypertrophy. Left ventricular diastolic parameters were normal. Right Ventricle: The right ventricular size is moderately enlarged. No increase in right ventricular wall thickness. Right ventricular systolic function is moderately reduced. Left Atrium: Left atrial size was mildly dilated. Right Atrium: Right atrial size was mildly dilated. Pericardium: There is no evidence of pericardial effusion. Mitral Valve: The mitral valve is grossly normal. Mild to moderate mitral valve regurgitation. MV peak gradient, 3.8 mmHg. The mean mitral valve gradient is 1.0 mmHg. Tricuspid Valve: The tricuspid valve is normal in structure. Tricuspid valve regurgitation is mild to moderate. Aortic Valve: The aortic valve is grossly normal. Aortic valve regurgitation is not visualized. Aortic valve mean gradient measures 4.0 mmHg. Aortic valve peak gradient measures 7.0 mmHg. Aortic valve area, by VTI measures 1.65 cm. Pulmonic Valve: The pulmonic valve was grossly normal. Pulmonic valve regurgitation is not visualized. Aorta: The aortic arch was not well visualized. IAS/Shunts: No atrial level shunt detected by color flow Doppler.  LEFT VENTRICLE PLAX 2D LVIDd:         5.42 cm  Diastology LVIDs:         4.82 cm  LV e' medial:  9.03  cm/s LV PW:         0.89 cm  LV E/e' medial:  8.7 LV IVS:        0.68 cm  LV e' lateral:   15.00 cm/s LVOT diam:     2.00 cm  LV E/e' lateral: 5.3 LV SV:         31 LV SV Index:   17 LVOT Area:     3.14 cm  RIGHT VENTRICLE RV Basal diam:  5.05 cm LEFT ATRIUM           Index       RIGHT ATRIUM           Index LA diam:      3.70 cm 1.96 cm/m  RA Area:     21.60 cm LA Vol (A4C): 39.9 ml 21.15 ml/m RA Volume:   66.00 ml  34.99 ml/m  AORTIC VALVE                   PULMONIC VALVE AV Area (Vmax):    1.68 cm    PV Vmax:       0.91 m/s AV Area (Vmean):   1.43 cm    PV Vmean:      66.700 cm/s AV Area (VTI):     1.65 cm    PV VTI:        0.129 m AV Vmax:           132.00 cm/s PV Peak grad:  3.3 mmHg AV Vmean:          91.100 cm/s PV Mean grad:  2.0 mmHg AV VTI:            0.189 m AV Peak Grad:      7.0 mmHg AV Mean Grad:      4.0 mmHg LVOT Vmax:         70.50 cm/s LVOT Vmean:        41.400 cm/s LVOT VTI:          0.099 m LVOT/AV VTI ratio: 0.53  AORTA Ao Root diam: 3.20 cm MITRAL VALVE               TRICUSPID VALVE MV Area (PHT): 4.09 cm    TR Peak grad:   22.8 mmHg MV Peak grad:  3.8 mmHg    TR Vmax:        239.00 cm/s MV Mean grad:  1.0 mmHg MV Vmax:       0.98 m/s    SHUNTS MV Vmean:      55.2 cm/s   Systemic VTI:  0.10 m MV Decel Time: 186 msec    Systemic Diam: 2.00 cm MV E velocity: 78.97 cm/s Dwayne Prince Rome MD Electronically signed by Yolonda Kida MD Signature Date/Time: 12/22/2019/1:34:27 PM    Final     EKG: Atrial fibrillation with RVR; HR of 130 bpm  ASSESSMENT AND PLAN:  1.  Atrial fibrillation with RVR, new onset, fairly stable, patient remains asymptomatic  -Etiology of atrial fibrillation caused by HFrEF or HFrEF caused by atrial fibrillation remains unclear.  -Agree with admit to telemetry unit.   - HR was minimally responsive to diltiazem therapy. We will discontinue diltiazem thearpy completely due to contraindications in HFrEF.   -We will give Digoxin 0.25mg  IV x1 dose in the  presence of borderline hypotension as this medication should not affect the blood presure; May repeat dose tonight x1.   -We will consider adding amiodarone  therapy if needed.   -CHA2DS2/VAS Score is 3 (age 64, diabetes mellitus), we will consider anticoagulation.   -Continuous Telemetry monitoring.  -Goal is to maintain HR around 120 bpm or less (60-120bpm)  -HFrEF management as stated below.    2.  HFrEF, uncompensated, unclear etiology: patient symptomatic with dyspnea and BLE edema, BNP elevated at 564.1,  Echocardiogram reveals severe LV systolic dysfunction with an estimated EF of 20-25%, global hypokinesis, mild-moderate dilatation of the LV; moderately reduced RV systolic function, mild dilation of the LA and RA; mild-moderate valvular regurgitation.  - Recommend digoxin as stated above.   - Start Entresto or ACEi and beta blocker once blood pressure improves.   -continue IV lasix; may need to reduce to 40mg  of lasix if hypotension persists.     -Monitor strict I&O's, daily standing weights, low sodium diet, apply bilateral compression wraps to BLE.    3.  Hypotension, fairly stable  -Hold off starting Entresto/ACEi and beta blocker at this time.   -Recommend monitoring bp q1h.   -Consider decreasing lasix.   -Fall precautions.   4.  DM Type II, stable  -agree with sliding scale insulin management per protocol.   5.  Mild acute on chronic kidney disease, fairly stable; creatinine= 1.48 (H)>>1.53(H)  -agree with trend CMP.   -consider decreasing diuretic therapy.   6.  DVT prophylaxis  -Agree with lovenox therapy.   Signed: Shanedra Lave ACNPC-AG 12/22/2019, 5:09 PM

## 2019-12-22 NOTE — ED Notes (Signed)
Coffee given at this time

## 2019-12-22 NOTE — Consult Note (Signed)
I have changes digoxin to 0.5 mg x 1 We may give more later to control his heart rate I am hopeful to start Entresto or an ACE inhibitor later once his blood pressure tolerates Patient will also probably need to be on a beta-blocker and/or amiodarone His severely depressed left ventricular function and low blood pressure is limited our ability to add some of these medication If his rate improves after 0.5 I may give an additional 0.25 later today Did should not affect the blood pressure thus far have chosen it I am hoping to have his pressure come up to between 90 and 100 We may need to reduce diuretic therapy Full consult note to follow

## 2019-12-22 NOTE — ED Notes (Signed)
Patient is sitting on side of the bed due to leg cramping. Patient was encouraged to lay on stretcher to bring down his heart rate.

## 2019-12-22 NOTE — ED Notes (Signed)
Dr. Clayborn Bigness at bedside.

## 2019-12-22 NOTE — ED Notes (Signed)
Messaged pharmacy regarding missing dose of cardizem at this time

## 2019-12-22 NOTE — ED Notes (Signed)
Patient placed in a semi-Fowlers position. Patient c/o frequent leg cramps, but declined tylenol earlier and Norco earlier. MD aware.

## 2019-12-22 NOTE — ED Notes (Signed)
Patient is alert and oriented. Dietary was called for dinner preferences. Wife is not at bedside.

## 2019-12-23 LAB — BASIC METABOLIC PANEL
Anion gap: 14 (ref 5–15)
BUN: 19 mg/dL (ref 8–23)
CO2: 27 mmol/L (ref 22–32)
Calcium: 9.8 mg/dL (ref 8.9–10.3)
Chloride: 97 mmol/L — ABNORMAL LOW (ref 98–111)
Creatinine, Ser: 1.73 mg/dL — ABNORMAL HIGH (ref 0.61–1.24)
GFR, Estimated: 40 mL/min — ABNORMAL LOW (ref >60)
Glucose, Bld: 129 mg/dL — ABNORMAL HIGH (ref 70–99)
Potassium: 4.4 mmol/L (ref 3.5–5.1)
Sodium: 138 mmol/L (ref 135–145)

## 2019-12-23 LAB — GLUCOSE, CAPILLARY
Glucose-Capillary: 130 mg/dL — ABNORMAL HIGH (ref 70–99)
Glucose-Capillary: 188 mg/dL — ABNORMAL HIGH (ref 70–99)
Glucose-Capillary: 117 mg/dL — ABNORMAL HIGH (ref 70–99)
Glucose-Capillary: 166 mg/dL — ABNORMAL HIGH (ref 70–99)

## 2019-12-23 LAB — CBC
HCT: 41.2 % (ref 39.0–52.0)
Hemoglobin: 13.6 g/dL (ref 13.0–17.0)
MCH: 31.6 pg (ref 26.0–34.0)
MCHC: 33 g/dL (ref 30.0–36.0)
MCV: 95.8 fL (ref 80.0–100.0)
Platelets: 237 10*3/uL (ref 150–400)
RBC: 4.3 MIL/uL (ref 4.22–5.81)
RDW: 13.2 % (ref 11.5–15.5)
WBC: 7.6 10*3/uL (ref 4.0–10.5)
nRBC: 0 % (ref 0.0–0.2)

## 2019-12-23 LAB — HEPARIN LEVEL (UNFRACTIONATED): Heparin Unfractionated: 0.47 IU/mL (ref 0.30–0.70)

## 2019-12-23 LAB — MAGNESIUM: Magnesium: 1.8 mg/dL (ref 1.7–2.4)

## 2019-12-23 LAB — PHOSPHORUS: Phosphorus: 3.6 mg/dL (ref 2.5–4.6)

## 2019-12-23 MED ORDER — AMIODARONE HCL IN DEXTROSE 360-4.14 MG/200ML-% IV SOLN
30.0000 mg/h | INTRAVENOUS | Status: DC
  Administered 2019-12-23 – 2019-12-24 (×3): 30 mg/h via INTRAVENOUS
  Filled 2019-12-23 (×2): qty 200

## 2019-12-23 MED ORDER — HEPARIN (PORCINE) 25000 UT/250ML-% IV SOLN
900.0000 [IU]/h | INTRAVENOUS | Status: DC
  Administered 2019-12-23: 1000 [IU]/h via INTRAVENOUS
  Filled 2019-12-23: qty 250

## 2019-12-23 MED ORDER — AMIODARONE LOAD VIA INFUSION
150.0000 mg | Freq: Once | INTRAVENOUS | Status: AC
  Administered 2019-12-23: 150 mg via INTRAVENOUS
  Filled 2019-12-23: qty 83.34

## 2019-12-23 MED ORDER — SACUBITRIL-VALSARTAN 24-26 MG PO TABS
1.0000 | ORAL_TABLET | Freq: Two times a day (BID) | ORAL | Status: DC
  Administered 2019-12-23 – 2019-12-24 (×4): 1 via ORAL
  Filled 2019-12-23 (×4): qty 1

## 2019-12-23 MED ORDER — AMIODARONE HCL IN DEXTROSE 360-4.14 MG/200ML-% IV SOLN
60.0000 mg/h | INTRAVENOUS | Status: AC
  Administered 2019-12-23 (×2): 60 mg/h via INTRAVENOUS
  Filled 2019-12-23 (×2): qty 200

## 2019-12-23 MED ORDER — FUROSEMIDE 40 MG PO TABS: 40.0000 mg | ORAL_TABLET | Freq: Every day | ORAL | Status: DC

## 2019-12-23 MED ORDER — METOPROLOL TARTRATE 5 MG/5ML IV SOLN: 2.5000 mg | INTRAVENOUS | Status: DC | PRN

## 2019-12-23 MED ORDER — FUROSEMIDE 40 MG PO TABS
40.0000 mg | ORAL_TABLET | Freq: Every day | ORAL | Status: DC
  Administered 2019-12-24: 40 mg via ORAL
  Filled 2019-12-23: qty 1

## 2019-12-23 NOTE — Progress Notes (Signed)
PROGRESS NOTE    Billy Franco  YSA:[id number]  DOB: 10/07/1940  DOA: 12/21/2019 PCP: Steele Sizer, MD Outpatient Specialists:   Hospital course:  79 year old male with DM2 was admitted 12/21/2019 with lower extremity edema and DOE.  Work-up revealed new onset atrial fibrillation with RVR.  Patient was noted to have mild elevation of his troponin.  Echocardiogram however showed new decrease in his ejection fraction now down to 20 to 25%.  Cardiology was consulted and they have been managing patient's atrial fibrillation with RVR as well as his new decompensated HFrEF.   Subjective:  Patient states he feels fine and he would like to go home.  Denies any shortness of breath.  Denies chest pain or palpitations.  States he will go home as soon as he is allowed.  Objective: Vitals:   12/23/19 0937 12/23/19 1053 12/23/19 1127 12/23/19 1609  BP: 116/69 130/89 124/82 95/78  Pulse: 86 89 76 99  Resp:   18 18  Temp:   97.8 F (36.6 C) 97.7 F (36.5 C)  TempSrc:      SpO2:   99% 100%  Weight:      Height:        Intake/Output Summary (Last 24 hours) at 12/23/2019 1901 Last data filed at 12/23/2019 1607 Gross per 24 hour  Intake 240 ml  Output 1075 ml  Net -835 ml   Filed Weights   12/21/19 1204 12/22/19 2128 12/23/19 0427  Weight: 77 kg 71.2 kg 71.2 kg     Exam:  General: Somewhat tired appearing man looking stated age lying in bed at 30 degrees in no acute respiratory distress. Eyes: sclera anicteric, conjuctiva mild injection bilaterally CVS: S1-S2, irregular  Respiratory: Patient has rales at bases bilaterally. GI: NABS, soft, NT  LE: 1+ edema bilaterally Neuro:  grossly nonfocal.  Psych:  mood and affect appropriate to situation.   Assessment & Plan:   New onset atrial fibrillation with RVR Patient did not respond well to the diltiazem, minimal decrease in heart rate Was subsequently treated with digoxin and then started on amiodarone tonight. Plan is  for transition to oral amiodarone once heart rate is under control. Chads vas 2 score is 3, presently on heparin drip. Can be transitioned to Eliquis prior to discharge.  New onset HFrEF BNP is 564, patient has DOE and bilateral lower extremity edema. Patient is presently getting diuresed with Lasix 40 mg daily. Cardiology, plan is to initiate Entresto if blood pressure tolerates it Further work-up including cardiac catheterization or stress test per cardiology as warranted Patient is not yet on a beta-blocker or spironolactone, these can be added as warranted per cardiology  DM2 Reasonable control of blood sugars under present management  Chronic LBP Continue home pain medication  BPH Continue Flomax    DVT prophylaxis: Heparin drip Code Status: Full Family Communication: Patient's wife was at bedside throughout Disposition Plan:   Patient is from: Home  Anticipated Discharge Location: Home  Barriers to Discharge: RVR  Is patient medically stable for Discharge: No   Consultants:  Cardiology  Procedures:  None  Antimicrobials:  None   Data Reviewed:  Basic Metabolic Panel: Recent Labs  Lab 12/21/19 1218 12/22/19 0504 12/23/19 0502  NA 139 137 138  K 4.9 3.7 4.4  CL 103 99 97*  CO2 24 25 27   GLUCOSE 123* 137* 129*  BUN 19 18 19   CREATININE 1.48* 1.53* 1.73*  CALCIUM 9.8 9.6 9.8  MG  --   --  1.8  PHOS  --   --  3.6   Liver Function Tests: No results for input(s): AST, ALT, ALKPHOS, BILITOT, PROT, ALBUMIN in the last 168 hours. No results for input(s): LIPASE, AMYLASE in the last 168 hours. No results for input(s): AMMONIA in the last 168 hours. CBC: Recent Labs  Lab 12/21/19 1218 12/23/19 0502  WBC 6.1 7.6  HGB 12.0* 13.6  HCT 37.6* 41.2  MCV 96.9 95.8  PLT 193 237   Cardiac Enzymes: No results for input(s): CKTOTAL, CKMB, CKMBINDEX, TROPONINI in the last 168 hours. BNP (last 3 results) No results for input(s): PROBNP in the last 8760  hours. CBG: Recent Labs  Lab 12/22/19 1752 12/22/19 2129 12/23/19 0820 12/23/19 1126 12/23/19 1627  GLUCAP 77 135* 130* 188* 117*    Recent Results (from the past 240 hour(s))  Resp Panel by RT-PCR (Flu A&B, Covid) Nasopharyngeal Swab     Status: None   Collection Time: 12/21/19  1:11 PM   Specimen: Nasopharyngeal Swab; Nasopharyngeal(NP) swabs in vial transport medium  Result Value Ref Range Status   SARS Coronavirus 2 by RT PCR NEGATIVE NEGATIVE Final    Comment: (NOTE) SARS-CoV-2 target nucleic acids are NOT DETECTED.  The SARS-CoV-2 RNA is generally detectable in upper respiratory specimens during the acute phase of infection. The lowest concentration of SARS-CoV-2 viral copies this assay can detect is 138 copies/mL. A negative result does not preclude SARS-Cov-2 infection and should not be used as the sole basis for treatment or other patient management decisions. A negative result may occur with  improper specimen collection/handling, submission of specimen other than nasopharyngeal swab, presence of viral mutation(s) within the areas targeted by this assay, and inadequate number of viral copies(<138 copies/mL). A negative result must be combined with clinical observations, patient history, and epidemiological information. The expected result is Negative.  Fact Sheet for Patients:  EntrepreneurPulse.com.au  Fact Sheet for Healthcare Providers:  IncredibleEmployment.be  This test is no t yet approved or cleared by the [place] FDA and  has been authorized for detection and/or diagnosis of SARS-CoV-2 by FDA under an Emergency Use Authorization (EUA). This EUA will remain  in effect (meaning this test can be used) for the duration of the COVID-19 declaration under Section 564(b)(1) of the Act, 21 U.S.C.section 360bbb-3(b)(1), unless the authorization is terminated  or revoked sooner.       Influenza A by PCR NEGATIVE  NEGATIVE Final   Influenza B by PCR NEGATIVE NEGATIVE Final    Comment: (NOTE) The Xpert Xpress SARS-CoV-2/FLU/RSV plus assay is intended as an aid in the diagnosis of influenza from Nasopharyngeal swab specimens and should not be used as a sole basis for treatment. Nasal washings and aspirates are unacceptable for Xpert Xpress SARS-CoV-2/FLU/RSV testing.  Fact Sheet for Patients: EntrepreneurPulse.com.au  Fact Sheet for Healthcare Providers: IncredibleEmployment.be  This test is not yet approved or cleared by the [place] FDA and has been authorized for detection and/or diagnosis of SARS-CoV-2 by FDA under an Emergency Use Authorization (EUA). This EUA will remain in effect (meaning this test can be used) for the duration of the COVID-19 declaration under Section 564(b)(1) of the Act, 21 U.S.C. section 360bbb-3(b)(1), unless the authorization is terminated or revoked.  Performed at [facility], [address] [place], [place] [address]       Studies: ECHOCARDIOGRAM COMPLETE  Result Date: 12/22/2019    ECHOCARDIOGRAM REPORT   Patient Name:   SUHAN PACI Date of Exam: 12/22/2019 Medical Rec #:  [id number]      Height:       67.0 in Accession #:    [id number]     Weight:       169.8 lb Date of Birth:  [birth year]      BSA:          1.886 m Patient Age:    40 years       BP:           104/67 mmHg Patient Gender: M              HR:           145 bpm. Exam Location:  [facility] Procedure: 2D Echo, Color Doppler and Cardiac Doppler Indications:     R06.00 Dyspnea  History:         Patient has no prior history of Echocardiogram examinations.                  Risk Factors:Diabetes.  Sonographer:     Charmayne Sheer RDCS (AE) Referring Phys:  [id number] AMY N COX Diagnosing Phys: Yolonda Kida MD  Sonographer Comments: Suboptimal apical window. IMPRESSIONS  1. Left ventricular ejection fraction, by estimation, is 20 to 25%. The left ventricle has  severely decreased function. The left ventricle demonstrates global hypokinesis. The left ventricular internal cavity size was mildly to moderately dilated. Left ventricular diastolic parameters were normal.  2. Right ventricular systolic function is moderately reduced. The right ventricular size is moderately enlarged.  3. Left atrial size was mildly dilated.  4. Right atrial size was mildly dilated.  5. The mitral valve is grossly normal. Mild to moderate mitral valve regurgitation.  6. Tricuspid valve regurgitation is mild to moderate.  7. The aortic valve is grossly normal. Aortic valve regurgitation is not visualized. FINDINGS  Left Ventricle: Left ventricular ejection fraction, by estimation, is 20 to 25%. 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 The left ventricle demonstrates global hypokinesis. The left ventricular internal cavity size was mildly to moderately dilated. There is no left ventricular hypertrophy. Left ventricular diastolic parameters were normal. Right Ventricle: The right ventricular size is moderately enlarged. No increase in right ventricular wall thickness. Right ventricular systolic function is moderately reduced. Left Atrium: Left atrial size was mildly dilated. Right Atrium: Right atrial size was mildly dilated. Pericardium: There is no evidence of pericardial effusion. Mitral Valve: The mitral valve is grossly normal. Mild to moderate mitral valve regurgitation. MV peak gradient, 3.8 mmHg. The mean mitral valve gradient is 1.0 mmHg. Tricuspid Valve: The tricuspid valve is normal in structure. Tricuspid valve regurgitation is mild to moderate. Aortic Valve: The aortic valve is grossly normal. Aortic valve regurgitation is not visualized. Aortic valve mean gradient measures 4.0 mmHg. Aortic valve peak gradient measures 7.0 mmHg. Aortic valve area, by VTI measures 1.65 cm. Pulmonic Valve: The pulmonic valve was grossly normal. Pulmonic valve regurgitation is not visualized.  Aorta: The aortic arch was not well visualized. IAS/Shunts: No atrial level shunt detected by color flow Doppler.  LEFT VENTRICLE PLAX 2D LVIDd:         5.42 cm  Diastology LVIDs:         4.82 cm  LV e' medial:    9.03 cm/s LV PW:         0.89 cm  LV E/e' medial:  8.7 LV IVS:        0.68 cm  LV e' lateral:   15.00 cm/s LVOT diam:     2.00  cm  LV E/e' lateral: 5.3 LV SV:         31 LV SV Index:   17 LVOT Area:     3.14 cm  RIGHT VENTRICLE RV Basal diam:  5.05 cm LEFT ATRIUM           Index       RIGHT ATRIUM           Index LA diam:      3.70 cm 1.96 cm/m  RA Area:     21.60 cm LA Vol (A4C): 39.9 ml 21.15 ml/m RA Volume:   66.00 ml  34.99 ml/m  AORTIC VALVE                   PULMONIC VALVE AV Area (Vmax):    1.68 cm    PV Vmax:       0.91 m/s AV Area (Vmean):   1.43 cm    PV Vmean:      66.700 cm/s AV Area (VTI):     1.65 cm    PV VTI:        0.129 m AV Vmax:           132.00 cm/s PV Peak grad:  3.3 mmHg AV Vmean:          91.100 cm/s PV Mean grad:  2.0 mmHg AV VTI:            0.189 m AV Peak Grad:      7.0 mmHg AV Mean Grad:      4.0 mmHg LVOT Vmax:         70.50 cm/s LVOT Vmean:        41.400 cm/s LVOT VTI:          0.099 m LVOT/AV VTI ratio: 0.53  AORTA Ao Root diam: 3.20 cm MITRAL VALVE               TRICUSPID VALVE MV Area (PHT): 4.09 cm    TR Peak grad:   22.8 mmHg MV Peak grad:  3.8 mmHg    TR Vmax:        239.00 cm/s MV Mean grad:  1.0 mmHg MV Vmax:       0.98 m/s    SHUNTS MV Vmean:      55.2 cm/s   Systemic VTI:  0.10 m MV Decel Time: 186 msec    Systemic Diam: 2.00 cm MV E velocity: 78.97 cm/s Dwayne Prince Rome MD Electronically signed by Yolonda Kida MD Signature Date/Time: 12/22/2019/1:34:27 PM    Final      Scheduled Meds: . atorvastatin  10 mg Oral q1800  . [START ON 12/24/2019] furosemide  40 mg Oral Daily  . insulin aspart  0-15 Units Subcutaneous TID WC  . insulin aspart  0-5 Units Subcutaneous QHS  . pantoprazole  40 mg Oral Daily  . potassium chloride  20 mEq Oral Daily  .  sacubitril-valsartan  1 tablet Oral BID  . sodium chloride flush  3 mL Intravenous Q12H  . tamsulosin  0.4 mg Oral Daily   Continuous Infusions: . sodium chloride    . amiodarone 30 mg/hr (12/23/19 1609)  . heparin 1,000 Units/hr (12/23/19 1230)    Principal Problem:   Afib ([place]) Active Problems:   Dyslipidemia associated with type 2 diabetes mellitus (HCC)   GERD (gastroesophageal reflux disease)   Chronic low back pain   Type 2 diabetes mellitus with other specified complication (HCC)   Chronic pain syndrome  Essential hypertension   Elevated troponin   AKI (acute kidney injury) ([place])   CKD (chronic kidney disease), stage II   A-fib ([place])     Yamil Dougher Derek Jack, [facility]  If 7PM-7AM, please contact night-coverage [email or web address] Password [email or web address] 12/23/2019, 7:01 PM    LOS: 1 day

## 2019-12-23 NOTE — Consult Note (Signed)
[place] for Heparin Indication: atrial fibrillation  Allergies  Allergen Reactions  . Codeine Rash and Other (See Comments)    Per pt "hard on my kidneys"  Per pt "hard on my kidneys"  Per pt "hard on my kidneys"   . Lovastatin Rash and Other (See Comments)    Patient Measurements: Height: 5\' 7"  (170.2 cm) Weight: 71.2 kg (157 lb) IBW/kg (Calculated) : 66.1 Heparin Dosing Weight: 71.2 kg  Vital Signs: Temp: 97.7 F (36.5 C) (12/03 1609) Temp Source: Oral (12/03 0822) BP: 95/78 (12/03 1609) Pulse Rate: 99 (12/03 1609)  Labs: Recent Labs    12/21/19 1218 12/22/19 0504 12/23/19 0502 12/23/19 1741  HGB 12.0*  --  13.6  --   HCT 37.6*  --  41.2  --   PLT 193  --  237  --   HEPARINUNFRC  --   --   --  0.47  CREATININE 1.48* 1.53* 1.73*  --   TROPONINIHS 19*  --   --   --     Estimated Creatinine Clearance: 32.4 mL/min (A) (by C-G formula based on SCr of 1.73 mg/dL (H)).   Medical History: Past Medical History:  Diagnosis Date  . Arthritis   . Diabetes mellitus without complication ([place])   . Nephrolithiasis     Medications:  Medications Prior to Admission  Medication Sig Dispense Refill Last Dose  . atorvastatin (LIPITOR) 10 MG tablet Take 1 tablet (10 mg total) by mouth daily at 6 PM. 90 tablet 1 12/20/2019 at Unknown time  . docusate sodium (COLACE) 50 MG capsule Take 50 mg by mouth daily as needed for mild constipation.    prn at prn  . HYDROcodone-acetaminophen (NORCO) 10-325 MG tablet Take 1 tablet by mouth 4 (four) times daily as needed.   prn at prn  . ibuprofen (ADVIL) 200 MG tablet Take 200 mg by mouth every 6 (six) hours as needed.   prn at prn  . metFORMIN (GLUCOPHAGE XR) 500 MG 24 hr tablet Take 1 tablet (500 mg total) by mouth daily with breakfast. 90 tablet 1 12/21/2019 at 0700  . omeprazole (PRILOSEC) 20 MG capsule TAKE 1 CAPSULE (20 MG TOTAL) BY MOUTH EVERY OTHER DAY. 45 capsule 1 12/20/2019 at Unknown time   . tamsulosin (FLOMAX) 0.4 MG CAPS capsule Take 1 capsule by mouth daily.   12/20/2019 at Unknown time   Scheduled:  . atorvastatin  10 mg Oral q1800  . [START ON 12/24/2019] furosemide  40 mg Oral Daily  . insulin aspart  0-15 Units Subcutaneous TID WC  . insulin aspart  0-5 Units Subcutaneous QHS  . pantoprazole  40 mg Oral Daily  . potassium chloride  20 mEq Oral Daily  . sacubitril-valsartan  1 tablet Oral BID  . sodium chloride flush  3 mL Intravenous Q12H  . tamsulosin  0.4 mg Oral Daily   Infusions:  . sodium chloride    . amiodarone 30 mg/hr (12/23/19 1609)  . heparin 1,000 Units/hr (12/23/19 1230)   PRN: sodium chloride, acetaminophen, docusate, HYDROcodone-acetaminophen, metoprolol tartrate, ondansetron (ZOFRAN) IV, sodium chloride flush Anti-infectives (From admission, onward)   None      Assessment: Pharmacy consulted to start heparin for new onset afib. CHADSVASc on 4. No DOAC PTA.   Goal of Therapy:  Heparin level 0.3-0.7 units/ml Monitor platelets by anticoagulation protocol: Yes   Plan:  Last HL therapeutic x1 at 0.47 (12/3 1741) Continue with heparin infusion at 1000 units/hr Recheck anti-Xa  level in 8 hours (~0200 12/4) and daily while on heparin. Continue to monitor H&H and platelets  Lorna Dibble, PharmD, BCPS 12/23/2019,6:11 PM

## 2019-12-23 NOTE — Progress Notes (Signed)
[facility] Cardiology  Patient Description: [name] is a 79 year old male with PMH significant for atherosclerosis of the aorta,  HLD, DM Type II, GERD, BPH, chronic osteoarthritis and chronic back pain who was admitted for atrial fibrillation with RVR and found to have HFrEF (EF estimated at 20-25%).  SUBJECTIVE: The patient reports to be doing much better on today and states that he is ready to be discharged. The patient states that his previous symptoms of dyspnea and BLE edema have completely resolved at this time. He continues to deny having any chest pain, palpitations, syncope or dizziness.   OBJECTIVE: The patient is resting comfortably in bed and is able to move around the room independently. Bedside Telemetry reveals atrial fibrillation with a HR between 109-141 bpm. The patient continues to be asymptomatic. His blood pressure has improved since his admission and his is normotensive. BLE edema has completely resolved. The patient has normal effort of breathing with a oxygen saturation of 99% on room air.   Vitals:   12/22/19 2030 12/22/19 2128 12/23/19 0427 12/23/19 0822  BP:  113/84 113/70 103/73  Pulse: 95 (!) 107 (!) 57 (!) 110  Resp: (!) 25 18 18 18   Temp:  99.2 F (37.3 C) 97.6 F (36.4 C) 97.8 F (36.6 C)  TempSrc:  Oral Oral Oral  SpO2: 99% 98% 98% 99%  Weight:  71.2 kg 71.2 kg   Height:  5\' 7"  (1.702 m)       Intake/Output Summary (Last 24 hours) at 12/23/2019 0844 Last data filed at 12/23/2019 9242 Gross per 24 hour  Intake --  Output 325 ml  Net -325 ml      PHYSICAL EXAM  General: Well developed, well nourished, in no acute distress HEENT:  Normocephalic and atraumatic. PERRLA  Neck:  No JVD.  +2 carotid pulses. negative for bruit.  Lungs: Clear bilaterally to auscultation. Chest expansion is symmetrical. Normal effort of breathing.  Heart: irregular RR . Normal S1 and S2 without gallops or murmurs.  Abdomen: Bowel sounds are positive, abdomen soft and non-tender   Msk: slight kyphosis of back Back, normal gait. Normal strength and tone for age. Extremities: No clubbing, cyanosis or edema.   Neuro: Alert and oriented X 3. Psych:  Good affect, responds appropriately   LABS: Basic Metabolic Panel: Recent Labs    12/22/19 0504 12/23/19 0502  NA 137 138  K 3.7 4.4  CL 99 97*  CO2 25 27  GLUCOSE 137* 129*  BUN 18 19  CREATININE 1.53* 1.73*  CALCIUM 9.6 9.8  MG  --  1.8  PHOS  --  3.6   Liver Function Tests: No results for input(s): AST, ALT, ALKPHOS, BILITOT, PROT, ALBUMIN in the last 72 hours. No results for input(s): LIPASE, AMYLASE in the last 72 hours. CBC: Recent Labs    12/21/19 1218 12/23/19 0502  WBC 6.1 7.6  HGB 12.0* 13.6  HCT 37.6* 41.2  MCV 96.9 95.8  PLT 193 237   Cardiac Enzymes: No results for input(s): CKTOTAL, CKMB, CKMBINDEX, TROPONINI in the last 72 hours. BNP: Invalid input(s): POCBNP D-Dimer: No results for input(s): DDIMER in the last 72 hours. Hemoglobin A1C: Recent Labs    12/22/19 0504  HGBA1C 7.0*   Fasting Lipid Panel: No results for input(s): CHOL, HDL, LDLCALC, TRIG, CHOLHDL, LDLDIRECT in the last 72 hours. Thyroid Function Tests: Recent Labs    12/22/19 0504  TSH 8.252*   Anemia Panel: No results for input(s): VITAMINB12, FOLATE, FERRITIN, TIBC, IRON, RETICCTPCT  in the last 72 hours.  DG Chest 2 View  Result Date: 12/21/2019 CLINICAL DATA:  Bilateral lower extremity swelling, shortness of breath. EXAM: CHEST - 2 VIEW COMPARISON:  February 26, 2015. FINDINGS: Stable cardiomediastinal silhouette. Both lungs are clear. No pneumothorax or pleural effusion is noted. The visualized skeletal structures are unremarkable. IMPRESSION: No active cardiopulmonary disease. Electronically Signed   By: Marijo Conception M.D.   On: 12/21/2019 12:45   ECHOCARDIOGRAM COMPLETE  Result Date: 12/22/2019    ECHOCARDIOGRAM REPORT   Patient Name:   Billy Franco Date of Exam: 12/22/2019 Medical Rec #:  [id number]       Height:       67.0 in Accession #:    [id number]     Weight:       169.8 lb Date of Birth:  12-Jan-1941      BSA:          1.886 m Patient Age:    16 years       BP:           104/67 mmHg Patient Gender: M              HR:           145 bpm. Exam Location:  [facility] Procedure: 2D Echo, Color Doppler and Cardiac Doppler Indications:     R06.00 Dyspnea  History:         Patient has no prior history of Echocardiogram examinations.                  Risk Factors:Diabetes.  Sonographer:     Charmayne Sheer RDCS (AE) Referring Phys:  [id number] AMY N COX Diagnosing Phys: Yolonda Kida MD  Sonographer Comments: Suboptimal apical window. IMPRESSIONS  1. Left ventricular ejection fraction, by estimation, is 20 to 25%. 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 The left ventricle demonstrates global hypokinesis. The left ventricular internal cavity size was mildly to moderately dilated. Left ventricular diastolic parameters were normal.  2. Right ventricular systolic function is moderately reduced. The right ventricular size is moderately enlarged.  3. Left atrial size was mildly dilated.  4. Right atrial size was mildly dilated.  5. The mitral valve is grossly normal. Mild to moderate mitral valve regurgitation.  6. Tricuspid valve regurgitation is mild to moderate.  7. The aortic valve is grossly normal. Aortic valve regurgitation is not visualized. FINDINGS  Left Ventricle: Left ventricular ejection fraction, by estimation, is 20 to 25%. 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 The left ventricle demonstrates global hypokinesis. The left ventricular internal cavity size was mildly to moderately dilated. There is no left ventricular hypertrophy. Left ventricular diastolic parameters were normal. Right Ventricle: The right ventricular size is moderately enlarged. No increase in right ventricular wall thickness. Right ventricular systolic function is moderately reduced. Left Atrium: Left atrial size was mildly  dilated. Right Atrium: Right atrial size was mildly dilated. Pericardium: There is no evidence of pericardial effusion. Mitral Valve: The mitral valve is grossly normal. Mild to moderate mitral valve regurgitation. MV peak gradient, 3.8 mmHg. The mean mitral valve gradient is 1.0 mmHg. Tricuspid Valve: The tricuspid valve is normal in structure. Tricuspid valve regurgitation is mild to moderate. Aortic Valve: The aortic valve is grossly normal. Aortic valve regurgitation is not visualized. Aortic valve mean gradient measures 4.0 mmHg. Aortic valve peak gradient measures 7.0 mmHg. Aortic valve area, by VTI measures 1.65 cm. Pulmonic Valve: The pulmonic valve was grossly normal. Pulmonic valve  regurgitation is not visualized. Aorta: The aortic arch was not well visualized. IAS/Shunts: No atrial level shunt detected by color flow Doppler.  LEFT VENTRICLE PLAX 2D LVIDd:         5.42 cm  Diastology LVIDs:         4.82 cm  LV e' medial:    9.03 cm/s LV PW:         0.89 cm  LV E/e' medial:  8.7 LV IVS:        0.68 cm  LV e' lateral:   15.00 cm/s LVOT diam:     2.00 cm  LV E/e' lateral: 5.3 LV SV:         31 LV SV Index:   17 LVOT Area:     3.14 cm  RIGHT VENTRICLE RV Basal diam:  5.05 cm LEFT ATRIUM           Index       RIGHT ATRIUM           Index LA diam:      3.70 cm 1.96 cm/m  RA Area:     21.60 cm LA Vol (A4C): 39.9 ml 21.15 ml/m RA Volume:   66.00 ml  34.99 ml/m  AORTIC VALVE                   PULMONIC VALVE AV Area (Vmax):    1.68 cm    PV Vmax:       0.91 m/s AV Area (Vmean):   1.43 cm    PV Vmean:      66.700 cm/s AV Area (VTI):     1.65 cm    PV VTI:        0.129 m AV Vmax:           132.00 cm/s PV Peak grad:  3.3 mmHg AV Vmean:          91.100 cm/s PV Mean grad:  2.0 mmHg AV VTI:            0.189 m AV Peak Grad:      7.0 mmHg AV Mean Grad:      4.0 mmHg LVOT Vmax:         70.50 cm/s LVOT Vmean:        41.400 cm/s LVOT VTI:          0.099 m LVOT/AV VTI ratio: 0.53  AORTA Ao Root diam: 3.20 cm MITRAL  VALVE               TRICUSPID VALVE MV Area (PHT): 4.09 cm    TR Peak grad:   22.8 mmHg MV Peak grad:  3.8 mmHg    TR Vmax:        239.00 cm/s MV Mean grad:  1.0 mmHg MV Vmax:       0.98 m/s    SHUNTS MV Vmean:      55.2 cm/s   Systemic VTI:  0.10 m MV Decel Time: 186 msec    Systemic Diam: 2.00 cm MV E velocity: 78.97 cm/s Dwayne D Callwood MD Electronically signed by Yolonda Kida MD Signature Date/Time: 12/22/2019/1:34:27 PM    Final      Echo: severe LV systolic dysfunction with estimated EF of 20-25%, LV global hypokinesis, mild-moderate dilatation of the LV; moderately reduced RV systolic function, mild dilation of the LA and RA; mild-moderate valvular regurgitation.   TELEMETRY: Atrial fibrillation   ASSESSMENT AND PLAN:  Principal Problem:   Afib ([place])  Active Problems:   Dyslipidemia associated with type 2 diabetes mellitus (HCC)   GERD (gastroesophageal reflux disease)   Chronic low back pain   Type 2 diabetes mellitus with other specified complication (HCC)   Chronic pain syndrome   Essential hypertension   Elevated troponin   AKI (acute kidney injury) ([place])   CKD (chronic kidney disease), stage II   A-fib ([place])   1.  Atrial fibrillation with RVR, new onset, fairly stable, patient remains asymptomatic, initially treated with diltiazem with minimal response and upon awareness of HFrEF diltiazem was discontinued; the patient was given 2 doses of 0.25mg  of IV digoxin with some improvement in HR that was short-lived.              -Etiology of atrial fibrillation caused by HFrEF or HFrEF caused by atrial fibrillation remains unclear.             -Continuous telemetry monitoring.              - We will start amiodarone gtt at this time to achieve target HR control (60- 120 bpm). Transition to oral amiodarone once HR controlled.   -Recommend pulmonary function test for baseline. CXR completed. AST/ALT=23/19 from 09/19/19; will order repeat.   -IV metoprolol 2.5mg  as needed for  added HR control.               -CHA2DS2/VAS Score is 3 (age 52, diabetes mellitus).   -Continue heparin gtt; we will look to transition the patient to eliquis later today or early tomorrow morning.              -Continuous Telemetry monitoring.             -HFrEF management as stated below.      -start beta-blocker as outpatient.      -Outpatient stress test recommended. Deferred at this time in the absence of chest pain and high sensitivity troponin of 19 (slight elevation)  2.  HFrEF, uncompensated, unclear etiology as stated above: patient symptomatic with dyspnea and BLE edema, BNP elevated at 564.1,  Echocardiogram reveals severe LV systolic dysfunction with an estimated EF of 20-25%, global hypokinesis, mild-moderate dilatation of the LV; moderately reduced RV systolic function, mild dilation of the LA and RA; mild-moderate valvular regurgitation.             - Start Entresto 24/26mg  twice daily as blood pressure tolerates. (BUN/creat=19/1.73(H))  -trend CMP for kidney functioning.   -Consider adding beta blocker and spirolactone as outpatient.              -transition to oral lasix 40mg  once daily.                           -Monitor strict I&O's, daily standing weights, low sodium diet, apply bilateral compression wraps to BLE.    3.  Hypotension, resolved at this time, patient is normotensive at this time             -Recommend monitoring bp q4h and prior to entresto therapy. Hold entresto if patient's bp is <110/59.               -Fall precautions.   4.  DM Type II, stable             -agree with sliding scale insulin management per protocol.   5.  Mild acute on chronic kidney disease, fairly stable; creatinine= 1.48 (H)>>1.53(H)>>1.73  (H)             -  agree with trend CMP.            -transition to oral lasix 40mg  once daily.      [place], [id number]-AG  12/23/2019 8:44 AM

## 2019-12-23 NOTE — Consult Note (Signed)
[place] for Heparin Indication: atrial fibrillation  Allergies  Allergen Reactions  . Codeine Rash and Other (See Comments)    Per pt "hard on my kidneys"  Per pt "hard on my kidneys"  Per pt "hard on my kidneys"   . Lovastatin Rash and Other (See Comments)    Patient Measurements: Height: 5\' 7"  (170.2 cm) Weight: 71.2 kg (157 lb) IBW/kg (Calculated) : 66.1 Heparin Dosing Weight: 71.2 kg  Vital Signs: Temp: 97.8 F (36.6 C) (12/03 0822) Temp Source: Oral (12/03 0822) BP: 116/69 (12/03 0937) Pulse Rate: 86 (12/03 0937)  Labs: Recent Labs    12/21/19 1218 12/22/19 0504 12/23/19 0502  HGB 12.0*  --  13.6  HCT 37.6*  --  41.2  PLT 193  --  237  CREATININE 1.48* 1.53* 1.73*  TROPONINIHS 19*  --   --     Estimated Creatinine Clearance: 32.4 mL/min (A) (by C-G formula based on SCr of 1.73 mg/dL (H)).   Medical History: Past Medical History:  Diagnosis Date  . Arthritis   . Diabetes mellitus without complication ([place])   . Nephrolithiasis     Medications:  Medications Prior to Admission  Medication Sig Dispense Refill Last Dose  . atorvastatin (LIPITOR) 10 MG tablet Take 1 tablet (10 mg total) by mouth daily at 6 PM. 90 tablet 1 12/20/2019 at Unknown time  . docusate sodium (COLACE) 50 MG capsule Take 50 mg by mouth daily as needed for mild constipation.    prn at prn  . HYDROcodone-acetaminophen (NORCO) 10-325 MG tablet Take 1 tablet by mouth 4 (four) times daily as needed.   prn at prn  . ibuprofen (ADVIL) 200 MG tablet Take 200 mg by mouth every 6 (six) hours as needed.   prn at prn  . metFORMIN (GLUCOPHAGE XR) 500 MG 24 hr tablet Take 1 tablet (500 mg total) by mouth daily with breakfast. 90 tablet 1 12/21/2019 at 0700  . omeprazole (PRILOSEC) 20 MG capsule TAKE 1 CAPSULE (20 MG TOTAL) BY MOUTH EVERY OTHER DAY. 45 capsule 1 12/20/2019 at Unknown time  . tamsulosin (FLOMAX) 0.4 MG CAPS capsule Take 1 capsule by mouth daily.    12/20/2019 at Unknown time   Scheduled:  . atorvastatin  10 mg Oral q1800  . furosemide  60 mg Intravenous Daily  . insulin aspart  0-15 Units Subcutaneous TID WC  . insulin aspart  0-5 Units Subcutaneous QHS  . pantoprazole  40 mg Oral Daily  . potassium chloride  20 mEq Oral Daily  . sodium chloride flush  3 mL Intravenous Q12H  . tamsulosin  0.4 mg Oral Daily   Infusions:  . sodium chloride    . amiodarone 60 mg/hr (12/23/19 6811)   Followed by  . amiodarone     PRN: sodium chloride, acetaminophen, docusate, HYDROcodone-acetaminophen, metoprolol tartrate, ondansetron (ZOFRAN) IV, sodium chloride flush Anti-infectives (From admission, onward)   None      Assessment: Pharmacy consulted to start heparin for new onset afib. CHADSVASc on 4. No DOAC PTA.   Goal of Therapy:  Heparin level 0.3-0.7 units/ml Monitor platelets by anticoagulation protocol: Yes   Plan:  No bolus. Pt received enoxaparin last night.  Start heparin infusion at 1000 units/hr Check anti-Xa level in 8 hours and daily while on heparin Continue to monitor H&H and platelets  Oswald Hillock, PharmD, BCPS 12/23/2019,9:53 AM

## 2019-12-24 LAB — BASIC METABOLIC PANEL
Anion gap: 12 (ref 5–15)
BUN: 28 mg/dL — ABNORMAL HIGH (ref 8–23)
CO2: 26 mmol/L (ref 22–32)
Calcium: 9.4 mg/dL (ref 8.9–10.3)
Chloride: 96 mmol/L — ABNORMAL LOW (ref 98–111)
Creatinine, Ser: 1.74 mg/dL — ABNORMAL HIGH (ref 0.61–1.24)
GFR, Estimated: 39 mL/min — ABNORMAL LOW (ref >60)
Glucose, Bld: 149 mg/dL — ABNORMAL HIGH (ref 70–99)
Potassium: 3.6 mmol/L (ref 3.5–5.1)
Sodium: 134 mmol/L — ABNORMAL LOW (ref 135–145)

## 2019-12-24 LAB — CBC
HCT: 42 % (ref 39.0–52.0)
Hemoglobin: 13.8 g/dL (ref 13.0–17.0)
MCH: 30.9 pg (ref 26.0–34.0)
MCHC: 32.9 g/dL (ref 30.0–36.0)
MCV: 94.2 fL (ref 80.0–100.0)
Platelets: 243 10*3/uL (ref 150–400)
RBC: 4.46 MIL/uL (ref 4.22–5.81)
RDW: 12.9 % (ref 11.5–15.5)
WBC: 9.1 10*3/uL (ref 4.0–10.5)
nRBC: 0 % (ref 0.0–0.2)

## 2019-12-24 LAB — HEPARIN LEVEL (UNFRACTIONATED): Heparin Unfractionated: 0.76 IU/mL — ABNORMAL HIGH (ref 0.30–0.70)

## 2019-12-24 LAB — GLUCOSE, CAPILLARY
Glucose-Capillary: 146 mg/dL — ABNORMAL HIGH (ref 70–99)
Glucose-Capillary: 185 mg/dL — ABNORMAL HIGH (ref 70–99)
Glucose-Capillary: 131 mg/dL — ABNORMAL HIGH (ref 70–99)
Glucose-Capillary: 172 mg/dL — ABNORMAL HIGH (ref 70–99)

## 2019-12-24 MED ORDER — DIGOXIN 0.25 MG/ML IJ SOLN
0.5000 mg | Freq: Once | INTRAMUSCULAR | Status: AC
  Administered 2019-12-24: 0.5 mg via INTRAVENOUS
  Filled 2019-12-24: qty 2

## 2019-12-24 MED ORDER — AMIODARONE HCL 200 MG PO TABS
200.0000 mg | ORAL_TABLET | Freq: Two times a day (BID) | ORAL | Status: DC
  Administered 2019-12-24 – 2019-12-25 (×3): 200 mg via ORAL
  Filled 2019-12-24 (×3): qty 1

## 2019-12-24 MED ORDER — DIGOXIN 125 MCG PO TABS
0.1250 mg | ORAL_TABLET | Freq: Every day | ORAL | Status: DC
  Administered 2019-12-25: 0.125 mg via ORAL
  Filled 2019-12-24: qty 1

## 2019-12-24 MED ORDER — DIGOXIN 0.25 MG/ML IJ SOLN
0.2500 mg | Freq: Once | INTRAMUSCULAR | Status: AC
  Administered 2019-12-24: 0.25 mg via INTRAVENOUS
  Filled 2019-12-24: qty 2

## 2019-12-24 MED ORDER — DIAZEPAM 5 MG PO TABS
5.0000 mg | ORAL_TABLET | Freq: Two times a day (BID) | ORAL | Status: DC | PRN
  Administered 2019-12-24 – 2019-12-25 (×3): 5 mg via ORAL
  Filled 2019-12-24 (×3): qty 1

## 2019-12-24 MED ORDER — APIXABAN 5 MG PO TABS
5.0000 mg | ORAL_TABLET | Freq: Two times a day (BID) | ORAL | Status: DC
  Administered 2019-12-24 – 2019-12-25 (×3): 5 mg via ORAL
  Filled 2019-12-24 (×3): qty 1

## 2019-12-24 NOTE — Progress Notes (Signed)
[facility] Cardiology    SUBJECTIVE: Patient feels much better denies any chest pain no shortness of breath mild leg edema which is significantly improved.  Will increase activity in anticipation of hopeful discharge soon   Vitals:   12/23/19 2310 12/24/19 0349 12/24/19 0739 12/24/19 1153  BP: 101/64 124/64 113/77 (!) 84/54  Pulse: 75 69 (!) 106 60  Resp: 18 18 16 16   Temp: 98 F (36.7 C) 97.7 F (36.5 C) 98 F (36.7 C) (!) 97.5 F (36.4 C)  TempSrc: Oral Oral Oral Oral  SpO2: 97% 100% 99% 100%  Weight:  69.2 kg    Height:         Intake/Output Summary (Last 24 hours) at 12/24/2019 1221 Last data filed at 12/24/2019 0930 Gross per 24 hour  Intake 773.02 ml  Output 1225 ml  Net -451.98 ml      PHYSICAL EXAM  General: Well developed, well nourished, in no acute distress HEENT:  Normocephalic and atramatic Neck:  No JVD.  Lungs: Clear bilaterally to auscultation and percussion. Heart: Irregular irregular normal S1 and S2 without gallops or murmurs.  Abdomen: Bowel sounds are positive, abdomen soft and non-tender  Msk:  Back normal, normal gait. Normal strength and tone for age. Extremities: No clubbing, cyanosis or edema.   Neuro: Alert and oriented X 3. Psych:  Good affect, responds appropriately   LABS: Basic Metabolic Panel: Recent Labs    12/23/19 0502 12/24/19 0209  NA 138 134*  K 4.4 3.6  CL 97* 96*  CO2 27 26  GLUCOSE 129* 149*  BUN 19 28*  CREATININE 1.73* 1.74*  CALCIUM 9.8 9.4  MG 1.8  --   PHOS 3.6  --    Liver Function Tests: No results for input(s): AST, ALT, ALKPHOS, BILITOT, PROT, ALBUMIN in the last 72 hours. No results for input(s): LIPASE, AMYLASE in the last 72 hours. CBC: Recent Labs    12/23/19 0502 12/24/19 0209  WBC 7.6 9.1  HGB 13.6 13.8  HCT 41.2 42.0  MCV 95.8 94.2  PLT 237 243   Cardiac Enzymes: No results for input(s): CKTOTAL, CKMB, CKMBINDEX, TROPONINI in the last 72 hours. BNP: Invalid input(s): POCBNP D-Dimer: No  results for input(s): DDIMER in the last 72 hours. Hemoglobin A1C: Recent Labs    12/22/19 0504  HGBA1C 7.0*   Fasting Lipid Panel: No results for input(s): CHOL, HDL, LDLCALC, TRIG, CHOLHDL, LDLDIRECT in the last 72 hours. Thyroid Function Tests: Recent Labs    12/22/19 0504  TSH 8.252*   Anemia Panel: No results for input(s): VITAMINB12, FOLATE, FERRITIN, TIBC, IRON, RETICCTPCT in the last 72 hours.  No results found.   Echo severely depressed overall left ventricular function ejection fraction of around 25%  TELEMETRY: Atrial fibrillation rate of about 110  ASSESSMENT AND PLAN:  Principal Problem:   Afib ([place]) Active Problems:   Dyslipidemia associated with type 2 diabetes mellitus (HCC)   GERD (gastroesophageal reflux disease)   Chronic low back pain   Type 2 diabetes mellitus with other specified complication (HCC)   Chronic pain syndrome   Essential hypertension   Elevated troponin   AKI (acute kidney injury) ([place])   CKD (chronic kidney disease), stage II   A-fib ([place])    Plan Agree with continuing telemetry for atrial fibrillation Switch amiodarone to p.o. load Recommend heart failure therapy Entresto Coreg Discontinue heparin and switch to Eliquis Consider nephrology for renal insufficiency Continue hypertension management and control Maintain diabetes management Increase activity ambulate in  [place] Medical therapy for hyperlipidemia as needed Continue management for GERD type symptoms Hopefully discharge home soon if medically stable   Yolonda Kida, MD 12/24/2019 12:21 PM

## 2019-12-24 NOTE — Consult Note (Signed)
[place] for apixiban Indication: atrial fibrillation  Allergies  Allergen Reactions  . Codeine Rash and Other (See Comments)    Per pt "hard on my kidneys"  Per pt "hard on my kidneys"  Per pt "hard on my kidneys"   . Lovastatin Rash and Other (See Comments)    Patient Measurements: Height: 5\' 7"  (170.2 cm) Weight: 69.2 kg (152 lb 8 oz) IBW/kg (Calculated) : 66.1 Heparin Dosing Weight: 71.2 kg  Vital Signs: Temp: 98 F (36.7 C) (12/04 0739) Temp Source: Oral (12/04 0739) BP: 113/77 (12/04 0739) Pulse Rate: 106 (12/04 0739)  Labs: Recent Labs    12/21/19 1218 12/21/19 1218 12/22/19 0504 12/23/19 0502 12/23/19 1741 12/24/19 0209  HGB 12.0*   < >  --  13.6  --  13.8  HCT 37.6*  --   --  41.2  --  42.0  PLT 193  --   --  237  --  243  HEPARINUNFRC  --   --   --   --  0.47 0.76*  CREATININE 1.48*   < > 1.53* 1.73*  --  1.74*  TROPONINIHS 19*  --   --   --   --   --    < > = values in this interval not displayed.    Estimated Creatinine Clearance: 32.2 mL/min (A) (by C-G formula based on SCr of 1.74 mg/dL (H)).   Medical History: Past Medical History:  Diagnosis Date  . Arthritis   . Diabetes mellitus without complication ([place])   . Nephrolithiasis     Medications:  Medications Prior to Admission  Medication Sig Dispense Refill Last Dose  . atorvastatin (LIPITOR) 10 MG tablet Take 1 tablet (10 mg total) by mouth daily at 6 PM. 90 tablet 1 12/20/2019 at Unknown time  . docusate sodium (COLACE) 50 MG capsule Take 50 mg by mouth daily as needed for mild constipation.    prn at prn  . HYDROcodone-acetaminophen (NORCO) 10-325 MG tablet Take 1 tablet by mouth 4 (four) times daily as needed.   prn at prn  . ibuprofen (ADVIL) 200 MG tablet Take 200 mg by mouth every 6 (six) hours as needed.   prn at prn  . metFORMIN (GLUCOPHAGE XR) 500 MG 24 hr tablet Take 1 tablet (500 mg total) by mouth daily with breakfast. 90 tablet 1  12/21/2019 at 0700  . omeprazole (PRILOSEC) 20 MG capsule TAKE 1 CAPSULE (20 MG TOTAL) BY MOUTH EVERY OTHER DAY. 45 capsule 1 12/20/2019 at Unknown time  . tamsulosin (FLOMAX) 0.4 MG CAPS capsule Take 1 capsule by mouth daily.   12/20/2019 at Unknown time   Scheduled:  . amiodarone  200 mg Oral BID  . apixaban  5 mg Oral BID  . atorvastatin  10 mg Oral q1800  . furosemide  40 mg Oral Daily  . insulin aspart  0-15 Units Subcutaneous TID WC  . insulin aspart  0-5 Units Subcutaneous QHS  . pantoprazole  40 mg Oral Daily  . potassium chloride  20 mEq Oral Daily  . sacubitril-valsartan  1 tablet Oral BID  . sodium chloride flush  3 mL Intravenous Q12H  . tamsulosin  0.4 mg Oral Daily   Infusions:  . sodium chloride     PRN: sodium chloride, acetaminophen, docusate, HYDROcodone-acetaminophen, metoprolol tartrate, ondansetron (ZOFRAN) IV, sodium chloride flush Anti-infectives (From admission, onward)   None      Assessment: Pharmacy consulted to start [place] for new onset  afib.   CHADSVASc on 4. No DOAC PTA - transitioning from heparin drip.  Goal of Therapy:  Monitor platelets by anticoagulation protocol: Yes   Plan:  Age < 22, Weight > 60kg - will start Eliquis 5mg  bid for A fib.  Heparin drip stopped, f/u labs cancelled  Lu Duffel, PharmD, BCPS Clinical Pharmacist 12/24/2019 10:26 AM

## 2019-12-24 NOTE — Consult Note (Signed)
[place] for Heparin Indication: atrial fibrillation  Allergies  Allergen Reactions  . Codeine Rash and Other (See Comments)    Per pt "hard on my kidneys"  Per pt "hard on my kidneys"  Per pt "hard on my kidneys"   . Lovastatin Rash and Other (See Comments)    Patient Measurements: Height: 5\' 7"  (170.2 cm) Weight: 71.2 kg (157 lb) IBW/kg (Calculated) : 66.1 Heparin Dosing Weight: 71.2 kg  Vital Signs: Temp: 98 F (36.7 C) (12/03 2310) Temp Source: Oral (12/03 2310) BP: 101/64 (12/03 2310) Pulse Rate: 75 (12/03 2310)  Labs: Recent Labs    12/21/19 1218 12/21/19 1218 12/22/19 0504 12/23/19 0502 12/23/19 1741 12/24/19 0209  HGB 12.0*   < >  --  13.6  --  13.8  HCT 37.6*  --   --  41.2  --  42.0  PLT 193  --   --  237  --  243  HEPARINUNFRC  --   --   --   --  0.47 0.76*  CREATININE 1.48*   < > 1.53* 1.73*  --  1.74*  TROPONINIHS 19*  --   --   --   --   --    < > = values in this interval not displayed.    Estimated Creatinine Clearance: 32.2 mL/min (A) (by C-G formula based on SCr of 1.74 mg/dL (H)).   Medical History: Past Medical History:  Diagnosis Date  . Arthritis   . Diabetes mellitus without complication ([place])   . Nephrolithiasis     Medications:  Medications Prior to Admission  Medication Sig Dispense Refill Last Dose  . atorvastatin (LIPITOR) 10 MG tablet Take 1 tablet (10 mg total) by mouth daily at 6 PM. 90 tablet 1 12/20/2019 at Unknown time  . docusate sodium (COLACE) 50 MG capsule Take 50 mg by mouth daily as needed for mild constipation.    prn at prn  . HYDROcodone-acetaminophen (NORCO) 10-325 MG tablet Take 1 tablet by mouth 4 (four) times daily as needed.   prn at prn  . ibuprofen (ADVIL) 200 MG tablet Take 200 mg by mouth every 6 (six) hours as needed.   prn at prn  . metFORMIN (GLUCOPHAGE XR) 500 MG 24 hr tablet Take 1 tablet (500 mg total) by mouth daily with breakfast. 90 tablet 1 12/21/2019 at  0700  . omeprazole (PRILOSEC) 20 MG capsule TAKE 1 CAPSULE (20 MG TOTAL) BY MOUTH EVERY OTHER DAY. 45 capsule 1 12/20/2019 at Unknown time  . tamsulosin (FLOMAX) 0.4 MG CAPS capsule Take 1 capsule by mouth daily.   12/20/2019 at Unknown time   Scheduled:  . atorvastatin  10 mg Oral q1800  . furosemide  40 mg Oral Daily  . insulin aspart  0-15 Units Subcutaneous TID WC  . insulin aspart  0-5 Units Subcutaneous QHS  . pantoprazole  40 mg Oral Daily  . potassium chloride  20 mEq Oral Daily  . sacubitril-valsartan  1 tablet Oral BID  . sodium chloride flush  3 mL Intravenous Q12H  . tamsulosin  0.4 mg Oral Daily   Infusions:  . sodium chloride    . amiodarone 30 mg/hr (12/23/19 2016)  . heparin 1,000 Units/hr (12/23/19 1230)   PRN: sodium chloride, acetaminophen, docusate, HYDROcodone-acetaminophen, metoprolol tartrate, ondansetron (ZOFRAN) IV, sodium chloride flush Anti-infectives (From admission, onward)   None      Assessment: Pharmacy consulted to start heparin for new onset afib. CHADSVASc on 4.  No DOAC PTA.   Goal of Therapy:  Heparin level 0.3-0.7 units/ml Monitor platelets by anticoagulation protocol: Yes   Plan:  12/4:  HL @ 0209 = 0.76 Will decrease 900 units/hr and recheck HL 8 hrs after rate change.   Jerney Baksh D, PharmD 12/24/2019,3:41 AM

## 2019-12-24 NOTE — Progress Notes (Signed)
PROGRESS NOTE    Billy Franco  EYC:[id number]  DOB: May 08, 1940  DOA: 12/21/2019 PCP: Steele Sizer, MD Outpatient Specialists:   Hospital course:  79 year old male with DM2 was admitted 12/21/2019 with lower extremity edema and DOE.  Work-up revealed new onset atrial fibrillation with RVR.  Patient was noted to have mild elevation of his troponin.  Echocardiogram however showed new decrease in his ejection fraction now down to 20 to 25%.  Cardiology was consulted and they have been managing patient's atrial fibrillation with RVR as well as his new decompensated HFrEF.   Subjective:  Patient is once again eager to go home.  Patient's wife states that he has had traumatic experiences in the past in the hospital so hospitals make him anxious.  Patient states he feels well but is disheartened that his heart rate is still running high.   Objective: Vitals:   12/24/19 0349 12/24/19 0739 12/24/19 1153 12/24/19 1637  BP: 124/64 113/77 (!) 84/54 (!) 86/53  Pulse: 69 (!) 106 60 66  Resp: 18 16 16 17   Temp: 97.7 F (36.5 C) 98 F (36.7 C) (!) 97.5 F (36.4 C) 98.4 F (36.9 C)  TempSrc: Oral Oral Oral Oral  SpO2: 100% 99% 100% 98%  Weight: 69.2 kg     Height:        Intake/Output Summary (Last 24 hours) at 12/24/2019 1716 Last data filed at 12/24/2019 0930 Gross per 24 hour  Intake 533.02 ml  Output 675 ml  Net -141.98 ml   Filed Weights   12/22/19 2128 12/23/19 0427 12/24/19 0349  Weight: 71.2 kg 71.2 kg 69.2 kg     Exam:  General: Patient looking the same as yesterday, lying in bed at 30 degrees in no distress.  Looking tired.  Eyes: sclera anicteric, conjuctiva mild injection bilaterally CVS: S1-S2, irregular  Respiratory: Patient has rales at bases bilaterally. GI: NABS, soft, NT  LE: 1+ edema bilaterally Neuro:  grossly nonfocal.  Psych:  mood and affect appropriate to situation.   Assessment & Plan:   New onset atrial fibrillation with RVR Heart rate is  still 110 to 135 on amiodarone drip with low blood pressure. Per cardiology, plan is to transition him from IV to p.o. amiodarone  Chads vas 2 score is 3, heparin drip transition to Eliquis today as well  New onset HFrEF BNP is 564, patient has DOE and bilateral lower extremity edema. Patient is presently getting diuresed with Lasix 40 mg daily. Cardiology, plan is to initiate Entresto if blood pressure tolerates it Further work-up including cardiac catheterization or stress test per cardiology as warranted Patient is not yet on a beta-blocker or spironolactone, these can be added as warranted per cardiology  Renal insufficiency Is likely secondary to hypotension and ongoing diuresis Although patient is only on Lasix 40 mg daily Can consider referral to nephrology if creatinine does not improve with improving blood pressures. Patient still has significant lower extremity edema so I do not think he is intravascularly volume depleted.  DM2 Reasonable control of blood sugars under present management  Chronic LBP Continue home pain medication  BPH Continue Flomax    DVT prophylaxis: Heparin drip Code Status: Full Family Communication: Patient's wife was at bedside throughout Disposition Plan:   Patient is from: Home  Anticipated Discharge Location: Home  Barriers to Discharge: RVR  Is patient medically stable for Discharge: No   Consultants:  Cardiology  Procedures:  None  Antimicrobials:  None   Data Reviewed:  Basic  Metabolic Panel: Recent Labs  Lab 12/21/19 1218 12/22/19 0504 12/23/19 0502 12/24/19 0209  NA 139 137 138 134*  K 4.9 3.7 4.4 3.6  CL 103 99 97* 96*  CO2 24 25 27 26   GLUCOSE 123* 137* 129* 149*  BUN 19 18 19  28*  CREATININE 1.48* 1.53* 1.73* 1.74*  CALCIUM 9.8 9.6 9.8 9.4  MG  --   --  1.8  --   PHOS  --   --  3.6  --    Liver Function Tests: No results for input(s): AST, ALT, ALKPHOS, BILITOT, PROT, ALBUMIN in the last 168 hours. No  results for input(s): LIPASE, AMYLASE in the last 168 hours. No results for input(s): AMMONIA in the last 168 hours. CBC: Recent Labs  Lab 12/21/19 1218 12/23/19 0502 12/24/19 0209  WBC 6.1 7.6 9.1  HGB 12.0* 13.6 13.8  HCT 37.6* 41.2 42.0  MCV 96.9 95.8 94.2  PLT 193 237 243   Cardiac Enzymes: No results for input(s): CKTOTAL, CKMB, CKMBINDEX, TROPONINI in the last 168 hours. BNP (last 3 results) No results for input(s): PROBNP in the last 8760 hours. CBG: Recent Labs  Lab 12/23/19 1627 12/23/19 2012 12/24/19 0739 12/24/19 1155 12/24/19 1647  GLUCAP 117* 166* 146* 185* 131*    Recent Results (from the past 240 hour(s))  Resp Panel by RT-PCR (Flu A&B, Covid) Nasopharyngeal Swab     Status: None   Collection Time: 12/21/19  1:11 PM   Specimen: Nasopharyngeal Swab; Nasopharyngeal(NP) swabs in vial transport medium  Result Value Ref Range Status   SARS Coronavirus 2 by RT PCR NEGATIVE NEGATIVE Final    Comment: (NOTE) SARS-CoV-2 target nucleic acids are NOT DETECTED.  The SARS-CoV-2 RNA is generally detectable in upper respiratory specimens during the acute phase of infection. The lowest concentration of SARS-CoV-2 viral copies this assay can detect is 138 copies/mL. A negative result does not preclude SARS-Cov-2 infection and should not be used as the sole basis for treatment or other patient management decisions. A negative result may occur with  improper specimen collection/handling, submission of specimen other than nasopharyngeal swab, presence of viral mutation(s) within the areas targeted by this assay, and inadequate number of viral copies(<138 copies/mL). A negative result must be combined with clinical observations, patient history, and epidemiological information. The expected result is Negative.  Fact Sheet for Patients:  EntrepreneurPulse.com.au  Fact Sheet for Healthcare Providers:  IncredibleEmployment.be  This  test is no t yet approved or cleared by the [place] FDA and  has been authorized for detection and/or diagnosis of SARS-CoV-2 by FDA under an Emergency Use Authorization (EUA). This EUA will remain  in effect (meaning this test can be used) for the duration of the COVID-19 declaration under Section 564(b)(1) of the Act, 21 U.S.C.section 360bbb-3(b)(1), unless the authorization is terminated  or revoked sooner.       Influenza A by PCR NEGATIVE NEGATIVE Final   Influenza B by PCR NEGATIVE NEGATIVE Final    Comment: (NOTE) The Xpert Xpress SARS-CoV-2/FLU/RSV plus assay is intended as an aid in the diagnosis of influenza from Nasopharyngeal swab specimens and should not be used as a sole basis for treatment. Nasal washings and aspirates are unacceptable for Xpert Xpress SARS-CoV-2/FLU/RSV testing.  Fact Sheet for Patients: EntrepreneurPulse.com.au  Fact Sheet for Healthcare Providers: IncredibleEmployment.be  This test is not yet approved or cleared by the [place] FDA and has been authorized for detection and/or diagnosis of SARS-CoV-2 by FDA under an Emergency Use  Authorization (EUA). This EUA will remain in effect (meaning this test can be used) for the duration of the COVID-19 declaration under Section 564(b)(1) of the Act, 21 U.S.C. section 360bbb-3(b)(1), unless the authorization is terminated or revoked.  Performed at [facility], [address], [place], [place] [address]       Studies: No results found.   Scheduled Meds: . amiodarone  200 mg Oral BID  . apixaban  5 mg Oral BID  . atorvastatin  10 mg Oral q1800  . furosemide  40 mg Oral Daily  . insulin aspart  0-15 Units Subcutaneous TID WC  . insulin aspart  0-5 Units Subcutaneous QHS  . pantoprazole  40 mg Oral Daily  . potassium chloride  20 mEq Oral Daily  . sacubitril-valsartan  1 tablet Oral BID  . sodium chloride flush  3 mL Intravenous Q12H  .  tamsulosin  0.4 mg Oral Daily   Continuous Infusions: . sodium chloride      Principal Problem:   Afib (HCC) Active Problems:   Dyslipidemia associated with type 2 diabetes mellitus (HCC)   GERD (gastroesophageal reflux disease)   Chronic low back pain   Type 2 diabetes mellitus with other specified complication (HCC)   Chronic pain syndrome   Essential hypertension   Elevated troponin   AKI (acute kidney injury) ([place])   CKD (chronic kidney disease), stage II   A-fib ([place])     Minah Axelrod Tublu Zarek Relph, [facility]  If 7PM-7AM, please contact night-coverage [email or web address] Password [email or web address] 12/24/2019, 5:16 PM    LOS: 2 days

## 2019-12-25 LAB — BASIC METABOLIC PANEL
Anion gap: 12 (ref 5–15)
BUN: 32 mg/dL — ABNORMAL HIGH (ref 8–23)
CO2: 26 mmol/L (ref 22–32)
Calcium: 9.5 mg/dL (ref 8.9–10.3)
Chloride: 98 mmol/L (ref 98–111)
Creatinine, Ser: 1.49 mg/dL — ABNORMAL HIGH (ref 0.61–1.24)
GFR, Estimated: 47 mL/min — ABNORMAL LOW (ref >60)
Glucose, Bld: 145 mg/dL — ABNORMAL HIGH (ref 70–99)
Potassium: 4.3 mmol/L (ref 3.5–5.1)
Sodium: 136 mmol/L (ref 135–145)

## 2019-12-25 LAB — GLUCOSE, CAPILLARY
Glucose-Capillary: 123 mg/dL — ABNORMAL HIGH (ref 70–99)
Glucose-Capillary: 230 mg/dL — ABNORMAL HIGH (ref 70–99)

## 2019-12-25 MED ORDER — LOSARTAN POTASSIUM 25 MG PO TABS: 25.0000 mg | ORAL_TABLET | Freq: Every day | ORAL | Status: DC

## 2019-12-25 MED ORDER — AMIODARONE HCL 200 MG PO TABS
ORAL_TABLET | ORAL | 0 refills | Status: DC
Start: 2019-12-25 — End: 2020-04-30

## 2019-12-25 MED ORDER — FUROSEMIDE 40 MG PO TABS: 40.0000 mg | ORAL_TABLET | Freq: Every day | ORAL | 0 refills | Status: DC

## 2019-12-25 MED ORDER — CARVEDILOL 3.125 MG PO TABS: 3.1250 mg | ORAL_TABLET | Freq: Two times a day (BID) | ORAL | 0 refills | Status: DC

## 2019-12-25 MED ORDER — APIXABAN 5 MG PO TABS
5.0000 mg | ORAL_TABLET | Freq: Two times a day (BID) | ORAL | 0 refills | Status: DC
Start: 2019-12-25 — End: 2020-04-30

## 2019-12-25 MED ORDER — CARVEDILOL 3.125 MG PO TABS: 3.1250 mg | ORAL_TABLET | Freq: Two times a day (BID) | ORAL | Status: DC

## 2019-12-25 MED ORDER — LOSARTAN POTASSIUM 25 MG PO TABS
25.0000 mg | ORAL_TABLET | Freq: Every day | ORAL | 0 refills | Status: DC
Start: 2019-12-25 — End: 2023-05-22

## 2019-12-25 MED ORDER — DIGOXIN 125 MCG PO TABS: 0.1250 mg | ORAL_TABLET | Freq: Every day | ORAL | 0 refills | Status: DC

## 2019-12-25 MED ORDER — DIAZEPAM 5 MG PO TABS: 5.0000 mg | ORAL_TABLET | Freq: Two times a day (BID) | ORAL | 0 refills | Status: AC | PRN

## 2019-12-25 NOTE — Discharge Instructions (Signed)

## 2019-12-25 NOTE — Plan of Care (Signed)
  Problem: Education: Goal: Ability to demonstrate management of disease process will improve Outcome: Adequate for Discharge Goal: Ability to verbalize understanding of medication therapies will improve Outcome: Adequate for Discharge Goal: Individualized Educational Video(s) Outcome: Adequate for Discharge   Problem: Activity: Goal: Capacity to carry out activities will improve Outcome: Adequate for Discharge   Problem: Cardiac: Goal: Ability to achieve and maintain adequate cardiopulmonary perfusion will improve Outcome: Adequate for Discharge   Problem: Education: Goal: Knowledge of General Education information will improve Description: Including pain rating scale, medication(s)/side effects and non-pharmacologic comfort measures Outcome: Adequate for Discharge   Problem: Clinical Measurements: Goal: Ability to maintain clinical measurements within normal limits will improve Outcome: Adequate for Discharge Goal: Will remain free from infection Outcome: Adequate for Discharge Goal: Diagnostic test results will improve Outcome: Adequate for Discharge Goal: Respiratory complications will improve Outcome: Adequate for Discharge Goal: Cardiovascular complication will be avoided Outcome: Adequate for Discharge   Problem: Elimination: Goal: Will not experience complications related to bowel motility Outcome: Adequate for Discharge Goal: Will not experience complications related to urinary retention Outcome: Adequate for Discharge   Problem: Coping: Goal: Level of anxiety will decrease Outcome: Adequate for Discharge   Problem: Pain Managment: Goal: General experience of comfort will improve Outcome: Adequate for Discharge   Problem: Safety: Goal: Ability to remain free from injury will improve Outcome: Adequate for Discharge   Problem: Skin Integrity: Goal: Risk for impaired skin integrity will decrease Outcome: Adequate for Discharge

## 2019-12-25 NOTE — Discharge Summary (Signed)
Billy Franco DJS:[id number] DOB: 05-Aug-1940 DOA: 12/21/2019  PCP: Steele Sizer, MD  Admit date: 12/21/2019  Discharge date: 12/25/2019  Admitted From: Home   disposition: Home   Recommendations for Outpatient Follow-up:   Follow up with Dr. Clayborn Bigness as per his recommendations.  Home Health: None Equipment/Devices: None Consultations: Cardiology Discharge Condition: Improved CODE STATUS: Full Diet Recommendation: Heart Healthy low-sodium carb modified  Diet Order            Diet - low sodium heart healthy           Diet Carb Modified           Diet heart healthy/carb modified Room service appropriate? Yes; Fluid consistency: Thin; Fluid restriction: 1500 mL Fluid  Diet effective now                  Chief Complaint  Patient presents with  . Atrial Fibrillation  . Shortness of Breath  . Leg Swelling     Brief history of present illness from the day of admission and additional interim summary    Billy Franco is a 79 y.o. male with medical history significant for chronic back pain, hyperlipidemia, non-insulin-dependent diabetes mellitus,  Presented to PCP for chief concerns of  shortness of breath with with exertion for about 1 month and then bilateral lower extremity swelling that started 12/16/19.  At PCPs office he was found to be in A. fib with RVR and was sent to the emergency department.  Additionally, he reports that two weeks ago he had constipation and abdominal pressured pain (peraumbilical) and burning and pain when he urinated.  He denies hematuria and blood in stool.  Patient was admitted for new onset atrial fibrillation with RVR.  Echocardiogram revealed new onset HFrEF with decreased ejection fraction from last echocardiogram.                                                                     Hospital Course   Patient was followed closely by cardiology for treatment of atrial fibrillation with RVR and decompensated HFrEF.  Heart rate was difficult to control given relatively low blood pressure which is his baseline.  Patient was unable to tolerate IV amiodarone and this was changed to p.o. amiodarone.  Patient in addition was unable to tolerate Entresto given persistently low blood pressures.  Patient's blood pressures were asymptomatic and he and his wife both stated multiple times that he is blood pressures usually run low.  Even when his blood pressure was 80s over 60s, patient was walking very quickly around the nursing station without any dizziness or shortness of breath.  Patient was discharged home on new medication regimen as below to follow-up with Dr. Clayborn Bigness.   New onset atrial fibrillation with RVR Heart rate under much  better control on oral amiodarone and initiation of Coreg 3.125 Chads vas 2 score is 3, heparin drip transition to Eliquis today as well  New onset HFrEF Patient is diuresing well on Lasix 40 mg daily  Patient was unable to tolerate Entresto due to hypotension He was started on Coreg 3.125 twice daily and losartan 25 mg daily Further work-up including cardiac catheterization or stress test per cardiology as warranted  Renal insufficiency Creatinine was improved on day of discharge most likely secondary to optimized [place] forces with optimization of cardiac function.  DM2 Reasonable control of blood sugars under present management  Chronic LBP Continue home pain medication  BPH Continue Flomax    Discharge diagnosis     Principal Problem:   Afib ([place]) Active Problems:   Dyslipidemia associated with type 2 diabetes mellitus (HCC)   GERD (gastroesophageal reflux disease)   Chronic low back pain   Type 2 diabetes mellitus with other specified complication (HCC)   Chronic pain syndrome   Essential hypertension    Elevated troponin   AKI (acute kidney injury) ([place])   CKD (chronic kidney disease), stage II   A-fib ([place])    Discharge instructions    Discharge Instructions    (HEART FAILURE PATIENTS) Call MD:  Anytime you have any of the following symptoms: 1) 3 pound weight gain in 24 hours or 5 pounds in 1 week 2) shortness of breath, with or without a dry hacking cough 3) swelling in the hands, feet or stomach 4) if you have to sleep on extra pillows at night in order to breathe.   Complete by: As directed    Call MD for:  extreme fatigue   Complete by: As directed    Call MD for:  persistant dizziness or light-headedness   Complete by: As directed    Diet - low sodium heart healthy   Complete by: As directed    Diet Carb Modified   Complete by: As directed    Discharge instructions   Complete by: As directed    1.  Make sure you know exactly your new heart medications are before you leave the hospital. 2.  You should be on losartan, Coreg, Eliquis, Lasix, digoxin, Lipitor and amiodarone. 3.  Please pay attention to the amiodarone dose: Take 1 tablet TWICE a day for TWO WEEKS and then DECREASE down to 1 tablet ONCE a day and stay on that. 4.  Follow-up with Dr. Clayborn Bigness as per his instructions.   Increase activity slowly   Complete by: As directed       Discharge Medications   Allergies as of 12/25/2019      Reactions   Codeine Rash, Other (See Comments)   Per pt "hard on my kidneys"  Per pt "hard on my kidneys"  Per pt "hard on my kidneys"    Lovastatin Rash, Other (See Comments)      Medication List    STOP taking these medications   ibuprofen 200 MG tablet Commonly known as: ADVIL     TAKE these medications   amiodarone 200 MG tablet Commonly known as: PACERONE Take 1 tablet (200 mg) TWICE a day for 2 weeks.  Then decrease to 1 tablet (200 mg) ONCE a day and stay on that.   apixaban 5 MG Tabs tablet Commonly known as: ELIQUIS Take 1 tablet (5 mg total) by mouth 2  (two) times daily.   atorvastatin 10 MG tablet Commonly known as: LIPITOR Take 1 tablet (10 mg total)  by mouth daily at 6 PM.   carvedilol 3.125 MG tablet Commonly known as: COREG Take 1 tablet (3.125 mg total) by mouth 2 (two) times daily with a meal.   diazepam 5 MG tablet Commonly known as: VALIUM Take 1 tablet (5 mg total) by mouth every 12 (twelve) hours as needed for up to 7 days for anxiety.   digoxin 0.125 MG tablet Commonly known as: LANOXIN Take 1 tablet (0.125 mg total) by mouth daily. Start taking on: December 26, 2019   docusate sodium 50 MG capsule Commonly known as: COLACE Take 50 mg by mouth daily as needed for mild constipation.   furosemide 40 MG tablet Commonly known as: LASIX Take 1 tablet (40 mg total) by mouth daily. Start taking on: December 26, 2019   HYDROcodone-acetaminophen 10-325 MG tablet Commonly known as: NORCO Take 1 tablet by mouth 4 (four) times daily as needed.   losartan 25 MG tablet Commonly known as: COZAAR Take 1 tablet (25 mg total) by mouth at bedtime.   metFORMIN 500 MG 24 hr tablet Commonly known as: Glucophage XR Take 1 tablet (500 mg total) by mouth daily with breakfast.   omeprazole 20 MG capsule Commonly known as: PRILOSEC TAKE 1 CAPSULE (20 MG TOTAL) BY MOUTH EVERY OTHER DAY.   tamsulosin 0.4 MG Caps capsule Commonly known as: FLOMAX Take 1 capsule by mouth daily.         Major procedures and Radiology Reports - PLEASE review detailed and final reports thoroughly  -       DG Chest 2 View  Result Date: 12/21/2019 CLINICAL DATA:  Bilateral lower extremity swelling, shortness of breath. EXAM: CHEST - 2 VIEW COMPARISON:  February 26, 2015. FINDINGS: Stable cardiomediastinal silhouette. Both lungs are clear. No pneumothorax or pleural effusion is noted. The visualized skeletal structures are unremarkable. IMPRESSION: No active cardiopulmonary disease. Electronically Signed   By: Marijo Conception M.D.   On: 12/21/2019  12:45   DG PAIN CLINIC C-ARM 1-60 MIN NO REPORT  Result Date: 11/28/2019 Fluoro was used, but no Radiologist interpretation will be provided. Please refer to "NOTES" tab for provider progress note.  ECHOCARDIOGRAM COMPLETE  Result Date: 12/22/2019    ECHOCARDIOGRAM REPORT   Patient Name:   Billy Franco Date of Exam: 12/22/2019 Medical Rec #:  [id number]      Height:       67.0 in Accession #:    [id number]     Weight:       169.8 lb Date of Birth:  08-08-1940      BSA:          1.886 m Patient Age:    69 years       BP:           104/67 mmHg Patient Gender: M              HR:           145 bpm. Exam Location:  [facility] Procedure: 2D Echo, Color Doppler and Cardiac Doppler Indications:     R06.00 Dyspnea  History:         Patient has no prior history of Echocardiogram examinations.                  Risk Factors:Diabetes.  Sonographer:     Charmayne Sheer RDCS (AE) Referring Phys:  [id number] AMY N COX Diagnosing Phys: Yolonda Kida MD  Sonographer Comments: Suboptimal apical window. IMPRESSIONS  1. Left ventricular ejection fraction,  by estimation, is 20 to 25%. 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 The left ventricle demonstrates global hypokinesis. The left ventricular internal cavity size was mildly to moderately dilated. Left ventricular diastolic parameters were normal.  2. Right ventricular systolic function is moderately reduced. The right ventricular size is moderately enlarged.  3. Left atrial size was mildly dilated.  4. Right atrial size was mildly dilated.  5. The mitral valve is grossly normal. Mild to moderate mitral valve regurgitation.  6. Tricuspid valve regurgitation is mild to moderate.  7. The aortic valve is grossly normal. Aortic valve regurgitation is not visualized. FINDINGS  Left Ventricle: Left ventricular ejection fraction, by estimation, is 20 to 25%. 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 The left ventricle demonstrates global hypokinesis. The left ventricular  internal cavity size was mildly to moderately dilated. There is no left ventricular hypertrophy. Left ventricular diastolic parameters were normal. Right Ventricle: The right ventricular size is moderately enlarged. No increase in right ventricular wall thickness. Right ventricular systolic function is moderately reduced. Left Atrium: Left atrial size was mildly dilated. Right Atrium: Right atrial size was mildly dilated. Pericardium: There is no evidence of pericardial effusion. Mitral Valve: The mitral valve is grossly normal. Mild to moderate mitral valve regurgitation. MV peak gradient, 3.8 mmHg. The mean mitral valve gradient is 1.0 mmHg. Tricuspid Valve: The tricuspid valve is normal in structure. Tricuspid valve regurgitation is mild to moderate. Aortic Valve: The aortic valve is grossly normal. Aortic valve regurgitation is not visualized. Aortic valve mean gradient measures 4.0 mmHg. Aortic valve peak gradient measures 7.0 mmHg. Aortic valve area, by VTI measures 1.65 cm. Pulmonic Valve: The pulmonic valve was grossly normal. Pulmonic valve regurgitation is not visualized. Aorta: The aortic arch was not well visualized. IAS/Shunts: No atrial level shunt detected by color flow Doppler.  LEFT VENTRICLE PLAX 2D LVIDd:         5.42 cm  Diastology LVIDs:         4.82 cm  LV e' medial:    9.03 cm/s LV PW:         0.89 cm  LV E/e' medial:  8.7 LV IVS:        0.68 cm  LV e' lateral:   15.00 cm/s LVOT diam:     2.00 cm  LV E/e' lateral: 5.3 LV SV:         31 LV SV Index:   17 LVOT Area:     3.14 cm  RIGHT VENTRICLE RV Basal diam:  5.05 cm LEFT ATRIUM           Index       RIGHT ATRIUM           Index LA diam:      3.70 cm 1.96 cm/m  RA Area:     21.60 cm LA Vol (A4C): 39.9 ml 21.15 ml/m RA Volume:   66.00 ml  34.99 ml/m  AORTIC VALVE                   PULMONIC VALVE AV Area (Vmax):    1.68 cm    PV Vmax:       0.91 m/s AV Area (Vmean):   1.43 cm    PV Vmean:      66.700 cm/s AV Area (VTI):     1.65 cm    PV  VTI:        0.129 m AV Vmax:  132.00 cm/s PV Peak grad:  3.3 mmHg AV Vmean:          91.100 cm/s PV Mean grad:  2.0 mmHg AV VTI:            0.189 m AV Peak Grad:      7.0 mmHg AV Mean Grad:      4.0 mmHg LVOT Vmax:         70.50 cm/s LVOT Vmean:        41.400 cm/s LVOT VTI:          0.099 m LVOT/AV VTI ratio: 0.53  AORTA Ao Root diam: 3.20 cm MITRAL VALVE               TRICUSPID VALVE MV Area (PHT): 4.09 cm    TR Peak grad:   22.8 mmHg MV Peak grad:  3.8 mmHg    TR Vmax:        239.00 cm/s MV Mean grad:  1.0 mmHg MV Vmax:       0.98 m/s    SHUNTS MV Vmean:      55.2 cm/s   Systemic VTI:  0.10 m MV Decel Time: 186 msec    Systemic Diam: 2.00 cm MV E velocity: 78.97 cm/s Billy Prince [place] MD Electronically signed by Yolonda Kida MD Signature Date/Time: 12/22/2019/1:34:27 PM    Final     Micro Results    Recent Results (from the past 240 hour(s))  Resp Panel by RT-PCR (Flu A&B, Covid) Nasopharyngeal Swab     Status: None   Collection Time: 12/21/19  1:11 PM   Specimen: Nasopharyngeal Swab; Nasopharyngeal(NP) swabs in vial transport medium  Result Value Ref Range Status   SARS Coronavirus 2 by RT PCR NEGATIVE NEGATIVE Final    Comment: (NOTE) SARS-CoV-2 target nucleic acids are NOT DETECTED.  The SARS-CoV-2 RNA is generally detectable in upper respiratory specimens during the acute phase of infection. The lowest concentration of SARS-CoV-2 viral copies this assay can detect is 138 copies/mL. A negative result does not preclude SARS-Cov-2 infection and should not be used as the sole basis for treatment or other patient management decisions. A negative result may occur with  improper specimen collection/handling, submission of specimen other than nasopharyngeal swab, presence of viral mutation(s) within the areas targeted by this assay, and inadequate number of viral copies(<138 copies/mL). A negative result must be combined with clinical observations, patient history, and  epidemiological information. The expected result is Negative.  Fact Sheet for Patients:  EntrepreneurPulse.com.au  Fact Sheet for Healthcare Providers:  IncredibleEmployment.be  This test is no t yet approved or cleared by the [place] FDA and  has been authorized for detection and/or diagnosis of SARS-CoV-2 by FDA under an Emergency Use Authorization (EUA). This EUA will remain  in effect (meaning this test can be used) for the duration of the COVID-19 declaration under Section 564(b)(1) of the Act, 21 U.S.C.section 360bbb-3(b)(1), unless the authorization is terminated  or revoked sooner.       Influenza A by PCR NEGATIVE NEGATIVE Final   Influenza B by PCR NEGATIVE NEGATIVE Final    Comment: (NOTE) The Xpert Xpress SARS-CoV-2/FLU/RSV plus assay is intended as an aid in the diagnosis of influenza from Nasopharyngeal swab specimens and should not be used as a sole basis for treatment. Nasal washings and aspirates are unacceptable for Xpert Xpress SARS-CoV-2/FLU/RSV testing.  Fact Sheet for Patients: EntrepreneurPulse.com.au  Fact Sheet for Healthcare Providers: IncredibleEmployment.be  This test is not yet approved  or cleared by the [place] and has been authorized for detection and/or diagnosis of SARS-CoV-2 by FDA under an Emergency Use Authorization (EUA). This EUA will remain in effect (meaning this test can be used) for the duration of the COVID-19 declaration under Section 564(b)(1) of the Act, 21 U.S.C. section 360bbb-3(b)(1), unless the authorization is terminated or revoked.  Performed at [facility], [address] [place], [place] [address]     Today   Subjective    Billy Franco feels much improved since admission.  Feels ready to go home.  Denies chest pain, shortness of breath or abdominal pain.  Feels they can take care of themselves with the resources  they have at home.  Objective   Blood pressure 100/60, pulse 61, temperature 97.8 F (36.6 C), temperature source Oral, resp. rate 19, height 5\' 7"  (1.702 m), weight 70.7 kg, SpO2 99 %.   Intake/Output Summary (Last 24 hours) at 12/25/2019 1805 Last data filed at 12/25/2019 1003 Gross per 24 hour  Intake --  Output 500 ml  Net -500 ml    Exam General: Patient appears well and in good spirits sitting up in bed in no acute distress.  Eyes: sclera anicteric, conjuctiva mild injection bilaterally CVS: S1-S2, regular  Respiratory:  decreased air entry bilaterally secondary to decreased inspiratory effort, rales at bases  GI: NABS, soft, NT  LE: No edema.  Neuro: A/O x 3, Moving all extremities equally with normal strength, CN 3-12 intact, grossly nonfocal.  Psych: patient is logical and coherent, judgement and insight appear normal, mood and affect appropriate to situation.    Data Review   CBC w Diff:  Lab Results  Component Value Date   WBC 9.1 12/24/2019   HGB 13.8 12/24/2019   HGB 12.0 (L) 08/16/2013   HCT 42.0 12/24/2019   HCT 34.7 (L) 08/16/2013   PLT 243 12/24/2019   PLT 298 08/16/2013   LYMPHOPCT 4.0 08/16/2013   MONOPCT 9.4 07/19/2018   MONOPCT 1.8 08/16/2013   EOSPCT 3.9 07/19/2018   EOSPCT 0.1 08/16/2013   BASOPCT 0.7 07/19/2018   BASOPCT 0.1 08/16/2013    CMP:  Lab Results  Component Value Date   NA 136 12/25/2019   NA 140 10/30/2014   NA 138 08/17/2013   K 4.3 12/25/2019   K 4.1 08/17/2013   CL 98 12/25/2019   CL 107 08/17/2013   CO2 26 12/25/2019   CO2 21 08/17/2013   BUN 32 (H) 12/25/2019   BUN 22 10/30/2014   BUN 30 (H) 08/17/2013   CREATININE 1.49 (H) 12/25/2019   CREATININE 1.08 09/19/2019   PROT 7.3 09/19/2019   PROT 7.3 10/30/2014   ALBUMIN 4.5 04/21/2016   ALBUMIN 4.4 10/30/2014   BILITOT 0.8 09/19/2019   BILITOT 0.5 10/30/2014   ALKPHOS 85 04/21/2016   AST 23 09/19/2019   ALT 19 09/19/2019  .   Total Time in preparing paper  work, data evaluation and todays exam - 35 minutes  Vashti Hey M.D on 12/25/2019 at 6:05 PM  [facility]   Office  [phone]

## 2019-12-25 NOTE — Progress Notes (Addendum)
[facility] Cardiology    SUBJECTIVE: Patient states he feels reasonably well no shortness of breath or dyspnea at rest no leg edema still has some dyspnea on exertion denies any palpitations or tachycardia no chest pain feels well enough to go home   Vitals:   12/24/19 2011 12/25/19 0428 12/25/19 0852 12/25/19 1034  BP: 90/62 123/68 (!) 85/64 106/65  Pulse: 71 86 86   Resp:   16   Temp: 98.3 F (36.8 C) 97.9 F (36.6 C) 98.2 F (36.8 C)   TempSrc: Oral Oral Oral   SpO2: 98% 99% 100%   Weight:  70.7 kg    Height:         Intake/Output Summary (Last 24 hours) at 12/25/2019 1036 Last data filed at 12/25/2019 1003 Gross per 24 hour  Intake --  Output 500 ml  Net -500 ml      PHYSICAL EXAM  General: Well developed, well nourished, in no acute distress HEENT:  Normocephalic and atramatic Neck:  No JVD.  Lungs: Clear bilaterally to auscultation and percussion. Heart: HRRR . Normal S1 and S2 without gallops or murmurs.  Abdomen: Bowel sounds are positive, abdomen soft and non-tender  Msk:  Back normal, normal gait. Normal strength and tone for age. Extremities: No clubbing, cyanosis or edema.   Neuro: Alert and oriented X 3. Psych:  Good affect, responds appropriately   LABS: Basic Metabolic Panel: Recent Labs    12/23/19 0502 12/23/19 0502 12/24/19 0209 12/25/19 0649  NA 138   < > 134* 136  K 4.4   < > 3.6 4.3  CL 97*   < > 96* 98  CO2 27   < > 26 26  GLUCOSE 129*   < > 149* 145*  BUN 19   < > 28* 32*  CREATININE 1.73*   < > 1.74* 1.49*  CALCIUM 9.8   < > 9.4 9.5  MG 1.8  --   --   --   PHOS 3.6  --   --   --    < > = values in this interval not displayed.   Liver Function Tests: No results for input(s): AST, ALT, ALKPHOS, BILITOT, PROT, ALBUMIN in the last 72 hours. No results for input(s): LIPASE, AMYLASE in the last 72 hours. CBC: Recent Labs    12/23/19 0502 12/24/19 0209  WBC 7.6 9.1  HGB 13.6 13.8  HCT 41.2 42.0  MCV 95.8 94.2  PLT 237 243    Cardiac Enzymes: No results for input(s): CKTOTAL, CKMB, CKMBINDEX, TROPONINI in the last 72 hours. BNP: Invalid input(s): POCBNP D-Dimer: No results for input(s): DDIMER in the last 72 hours. Hemoglobin A1C: No results for input(s): HGBA1C in the last 72 hours. Fasting Lipid Panel: No results for input(s): CHOL, HDL, LDLCALC, TRIG, CHOLHDL, LDLDIRECT in the last 72 hours. Thyroid Function Tests: No results for input(s): TSH, T4TOTAL, T3FREE, THYROIDAB in the last 72 hours.  Invalid input(s): FREET3 Anemia Panel: No results for input(s): VITAMINB12, FOLATE, FERRITIN, TIBC, IRON, RETICCTPCT in the last 72 hours.  No results found.   Echo severely depressed left ventricular function less than 25% globally  TELEMETRY: Atrial fibrillation heart rate 1 10-130:  ASSESSMENT AND PLAN:  Principal Problem:   Afib ([place]) Active Problems:   Dyslipidemia associated with type 2 diabetes mellitus (HCC)   GERD (gastroesophageal reflux disease)   Chronic low back pain   Type 2 diabetes mellitus with other specified complication (HCC)   Chronic pain syndrome  Essential hypertension   Elevated troponin   AKI (acute kidney injury) ([place])   CKD (chronic kidney disease), stage II   A-fib ([place])   Plan We will hopefully try to discharge the patient home today Will discontinue Entresto because of hypotension and switch to an ARB like losartan 25 mg daily To heart failure clinic We will consider LifeVest or AICD as an outpatient Refer the patient to nephrology as an outpatient for CKD 3 Have the patient follow-up with cardiology this week [place]  Will start  losartan 25 mg QHS Coreg 3.125 twice daily Eliquis 5 mg twice a day Amiodarone 200 mg twice daily x2 weeks then 200 mg a day Lasix 40 mg p.o. daily Digoxin 0.125 mg daily Lipitor 10 mg daily  Yolonda Kida, MD 12/25/2019 10:36 AM

## 2019-12-26 ENCOUNTER — Ambulatory Visit: Payer: Medicare Other

## 2019-12-29 ENCOUNTER — Other Ambulatory Visit: Payer: Self-pay | Admitting: Family Medicine

## 2019-12-29 DIAGNOSIS — K219 Gastro-esophageal reflux disease without esophagitis: Secondary | ICD-10-CM

## 2020-01-02 ENCOUNTER — Other Ambulatory Visit: Payer: Self-pay

## 2020-01-02 ENCOUNTER — Encounter: Payer: Self-pay | Admitting: Student in an Organized Health Care Education/Training Program

## 2020-01-02 ENCOUNTER — Ambulatory Visit
Payer: Medicare Other | Attending: Student in an Organized Health Care Education/Training Program | Admitting: Student in an Organized Health Care Education/Training Program

## 2020-01-02 DIAGNOSIS — I959 Hypotension, unspecified: Secondary | ICD-10-CM | POA: Diagnosis not present

## 2020-01-02 DIAGNOSIS — G894 Chronic pain syndrome: Secondary | ICD-10-CM

## 2020-01-02 DIAGNOSIS — M5116 Intervertebral disc disorders with radiculopathy, lumbar region: Secondary | ICD-10-CM

## 2020-01-02 DIAGNOSIS — I480 Paroxysmal atrial fibrillation: Secondary | ICD-10-CM | POA: Diagnosis not present

## 2020-01-02 DIAGNOSIS — I10 Essential (primary) hypertension: Secondary | ICD-10-CM | POA: Diagnosis not present

## 2020-01-02 DIAGNOSIS — E782 Mixed hyperlipidemia: Secondary | ICD-10-CM | POA: Diagnosis not present

## 2020-01-02 DIAGNOSIS — Z23 Encounter for immunization: Secondary | ICD-10-CM | POA: Diagnosis not present

## 2020-01-02 DIAGNOSIS — M5416 Radiculopathy, lumbar region: Secondary | ICD-10-CM | POA: Diagnosis not present

## 2020-01-02 DIAGNOSIS — I5021 Acute systolic (congestive) heart failure: Secondary | ICD-10-CM | POA: Diagnosis not present

## 2020-01-02 DIAGNOSIS — G8929 Other chronic pain: Secondary | ICD-10-CM | POA: Diagnosis not present

## 2020-01-02 DIAGNOSIS — E119 Type 2 diabetes mellitus without complications: Secondary | ICD-10-CM | POA: Diagnosis not present

## 2020-01-02 DIAGNOSIS — K219 Gastro-esophageal reflux disease without esophagitis: Secondary | ICD-10-CM | POA: Diagnosis not present

## 2020-01-02 DIAGNOSIS — I429 Cardiomyopathy, unspecified: Secondary | ICD-10-CM | POA: Diagnosis not present

## 2020-01-02 NOTE — Progress Notes (Signed)
Patient: Billy Franco  Service Category: E/M  Provider: Gillis Santa, MD  DOB: [birth year]  DOS: 01/02/2020  Location: Office  MRN: [id number]  Setting: Ambulatory outpatient  Referring Provider: Steele Sizer, MD  Type: Established Patient  Specialty: Interventional Pain Management  PCP: Steele Sizer, MD  Location: Home  Delivery: TeleHealth     Virtual Encounter - Pain Management PROVIDER NOTE: Information contained herein reflects review and annotations entered in association with encounter. Interpretation of such information and data should be left to medically-trained personnel. Information provided to patient can be located elsewhere in the medical record under "Patient Instructions". Document created using STT-dictation technology, any transcriptional errors that may result from process are unintentional.    Contact & Pharmacy Preferred: [phone] Home: [phone] (home) Mobile: [phone] (mobile) E-mail: No e-mail address on record  [id number] [address]Phone: [phone]: [phone]  Pre-screening  Mr. MBaswelloffered "in-person" vs "virtual" encounter. He indicated preferring virtual for this encounter.   Reason COVID-19*  Social distancing based on CDC and AMA recommendations.   I contacted URupert Stackson 01/02/2020 via telephone.      I clearly identified myself as BGillis Santa MD. I verified that I was speaking with the correct person using two identifiers (Name: URichie Bonanno and date of birth: [birth year].  Consent I sought verbal advanced consent from URupert Stacksfor virtual visit interactions. I informed Mr. MQuesinberryof possible security and privacy concerns, risks, and limitations associated with providing "not-in-person" medical evaluation and management services. I also informed Mr. MMilanesof the availability of "in-person" appointments. Finally, I informed him  that there would be a charge for the virtual visit and that he could be  personally, fully or partially, financially responsible for it. Mr. MVandenbergheexpressed understanding and agreed to proceed.   Historic Elements   Mr. UDeidrick Raineyis a 79y.o. year old, male patient evaluated today after our last contact on 11/28/2019. Mr. MWinterton has a past medical history of Arthritis, Diabetes mellitus without complication ([place], and Nephrolithiasis. He also  has a past surgical history that includes Nasal fracture surgery (1994) and Ureterolithotomy (Right, 2015). Mr. MHickamhas a current medication list which includes the following prescription(s): amiodarone, apixaban, atorvastatin, carvedilol, digoxin, docusate sodium, furosemide, hydrocodone-acetaminophen, losartan, metformin, omeprazole, and tamsulosin. He  reports that he has never smoked. He has never used smokeless tobacco. He reports that he does not drink alcohol and does not use drugs. Mr. MSteibis allergic to codeine and lovastatin.   HPI  Today, he is being contacted for a post-procedure assessment.   Post-Procedure Evaluation  Procedure (11/28/2019):   Type: Diagnostic Inter-Laminar Epidural Steroid Injection  #1  Region: Lumbar Level: L5-S1 Level. Laterality: Right-Sided     Sedation: Please see nurses note.  Effectiveness during initial hour after procedure(Ultra-Short Term Relief): 50 %   Local anesthetic used: Long-acting (4-6 hours) Effectiveness: Defined as any analgesic benefit obtained secondary to the administration of local anesthetics. This carries significant diagnostic value as to the etiological location, or anatomical origin, of the pain. Duration of benefit is expected to coincide with the duration of the local anesthetic used.  Effectiveness during initial 4-6 hours after procedure(Short-Term Relief): 40 %  Long-term benefit: Defined as any relief past the pharmacologic duration of the local anesthetics.   Effectiveness past the initial 6 hours after procedure(Long-Term Relief): 40 %   Current benefits: Defined as benefit  that persist at this time.   Analgesia: 40- 50% improved Function: Mr. Hoagland reports improvement in function ROM: Somewhat improved   Laboratory Chemistry Profile   Renal Lab Results  Component Value Date   BUN 32 (H) 12/25/2019   CREATININE 1.49 (H) 12/25/2019   LABCREA 114 40/08/6759   BCR NOT APPLICABLE 95/09/3265   GFRAA 75 09/19/2019   GFRNONAA 47 (L) 12/25/2019     Hepatic Lab Results  Component Value Date   AST 23 09/19/2019   ALT 19 09/19/2019   ALBUMIN 4.5 04/21/2016   ALKPHOS 85 04/21/2016     Electrolytes Lab Results  Component Value Date   NA 136 12/25/2019   K 4.3 12/25/2019   CL 98 12/25/2019   CALCIUM 9.5 12/25/2019   MG 1.8 12/23/2019   PHOS 3.6 12/23/2019     Bone No results found for: VD25OH, VD125OH2TOT, TI4580DX8, PJ8250NL9, 25OHVITD1, 25OHVITD2, 25OHVITD3, TESTOFREE, TESTOSTERONE   Inflammation (CRP: Acute Phase) (ESR: Chronic Phase) No results found for: CRP, ESRSEDRATE, LATICACIDVEN     Note: Above Lab results reviewed.  Imaging  ECHOCARDIOGRAM COMPLETE    ECHOCARDIOGRAM REPORT       Patient Name:   JEFFRE ENRIQUES Date of Exam: 12/22/2019 Medical Rec #:  [id number]      Height:       67.0 in Accession #:    [id number]     Weight:       169.8 lb Date of Birth:  [birth year]      BSA:          1.886 m Patient Age:    79 years       BP:           104/67 mmHg Patient Gender: M              HR:           145 bpm. Exam Location:  [facility]  Procedure: 2D Echo, Color Doppler and Cardiac Doppler  Indications:     R06.00 Dyspnea   History:         Patient has no prior history of Echocardiogram examinations.                  Risk Factors:Diabetes.   Sonographer:     Charmayne Sheer RDCS (AE) Referring Phys:  [id number] AMY N [facility] Diagnosing Phys: Yolonda Kida MD    Sonographer Comments: Suboptimal apical  window. IMPRESSIONS   1. Left ventricular ejection fraction, by estimation, is 20 to 25%. 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 The left ventricle demonstrates global hypokinesis. The left ventricular internal cavity size was mildly to moderately dilated. Left  ventricular diastolic parameters were normal.  2. Right ventricular systolic function is moderately reduced. The right ventricular size is moderately enlarged.  3. Left atrial size was mildly dilated.  4. Right atrial size was mildly dilated.  5. The mitral valve is grossly normal. Mild to moderate mitral valve regurgitation.  6. Tricuspid valve regurgitation is mild to moderate.  7. The aortic valve is grossly normal. Aortic valve regurgitation is not visualized.  FINDINGS  Left Ventricle: Left ventricular ejection fraction, by estimation, is 20 to 25%. 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 The left ventricle demonstrates global hypokinesis. The left ventricular internal cavity size was mildly to moderately  dilated. There is no left ventricular hypertrophy. Left ventricular diastolic parameters were normal.  Right Ventricle: The right ventricular size is moderately enlarged. No increase in right ventricular wall thickness.  Right ventricular systolic function is moderately reduced.  Left Atrium: Left atrial size was mildly dilated.  Right Atrium: Right atrial size was mildly dilated.  Pericardium: There is no evidence of pericardial effusion.  Mitral Valve: The mitral valve is grossly normal. Mild to moderate mitral valve regurgitation. MV peak gradient, 3.8 mmHg. The mean mitral valve gradient is 1.0 mmHg.  Tricuspid Valve: The tricuspid valve is normal in structure. Tricuspid valve regurgitation is mild to moderate.  Aortic Valve: The aortic valve is grossly normal. Aortic valve regurgitation is not visualized. Aortic valve mean gradient measures 4.0 mmHg. Aortic valve peak gradient measures 7.0 mmHg.  Aortic valve area, by VTI measures 1.65 cm.  Pulmonic Valve: The pulmonic valve was grossly normal. Pulmonic valve regurgitation is not visualized.  Aorta: The aortic arch was not well visualized.  IAS/Shunts: No atrial level shunt detected by color flow Doppler.    LEFT VENTRICLE PLAX 2D LVIDd:         5.42 cm  Diastology LVIDs:         4.82 cm  LV e' medial:    9.03 cm/s LV PW:         0.89 cm  LV E/e' medial:  8.7 LV IVS:        0.68 cm  LV e' lateral:   15.00 cm/s LVOT diam:     2.00 cm  LV E/e' lateral: 5.3 LV SV:         31 LV SV Index:   17 LVOT Area:     3.14 cm    RIGHT VENTRICLE RV Basal diam:  5.05 cm  LEFT ATRIUM           Index       RIGHT ATRIUM           Index LA diam:      3.70 cm 1.96 cm/m  RA Area:     21.60 cm LA Vol (A4C): 39.9 ml 21.15 ml/m RA Volume:   66.00 ml  34.99 ml/m  AORTIC VALVE                   PULMONIC VALVE AV Area (Vmax):    1.68 cm    PV Vmax:       0.91 m/s AV Area (Vmean):   1.43 cm    PV Vmean:      66.700 cm/s AV Area (VTI):     1.65 cm    PV VTI:        0.129 m AV Vmax:           132.00 cm/s PV Peak grad:  3.3 mmHg AV Vmean:          91.100 cm/s PV Mean grad:  2.0 mmHg AV VTI:            0.189 m AV Peak Grad:      7.0 mmHg AV Mean Grad:      4.0 mmHg LVOT Vmax:         70.50 cm/s LVOT Vmean:        41.400 cm/s LVOT VTI:          0.099 m LVOT/AV VTI ratio: 0.53   AORTA Ao Root diam: 3.20 cm  MITRAL VALVE               TRICUSPID VALVE MV Area (PHT): 4.09 cm    TR Peak grad:   22.8 mmHg MV Peak grad:  3.8 mmHg    TR Vmax:  239.00 cm/s MV Mean grad:  1.0 mmHg MV Vmax:       0.98 m/s    SHUNTS MV Vmean:      55.2 cm/s   Systemic VTI:  0.10 m MV Decel Time: 186 msec    Systemic Diam: 2.00 cm MV E velocity: 78.97 cm/s  Yolonda Kida MD Electronically signed by Yolonda Kida MD Signature Date/Time: 12/22/2019/1:34:27 PM      Final    Assessment  The primary encounter diagnosis was Chronic radicular  lumbar pain (right). Diagnoses of Lumbar radiculopathy, Neuritis 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, and Chronic pain syndrome were also pertinent to this visit.  Plan of Care  Mr. Thayden Lemire has a current medication list which includes the following long-term medication(s): amiodarone, apixaban, atorvastatin, carvedilol, digoxin, furosemide, losartan, metformin, and omeprazole.  Follow-up as needed for repeat lumbar epidural steroid injection.  I notified the patient that he will need clearance from Dr. Clayborn Bigness, his cardiologist to stop Eliquis 3 days prior to his scheduled procedure.  Orders:  Orders Placed This Encounter  Procedures  . Lumbar Epidural Injection    Standing Status:   Standing    Number of Occurrences:   9    Standing Expiration Date:   01/01/2021    Scheduling Instructions:     Purpose: Palliative     Indication: Lower extremity pain/Sciatica unspecified side (M54.30).     Side: Midline     Level: TBD     Sedation: without     TIMEFRAME: PRN procedure. (Mr. Mczeal will call when needed.)     Blood thinner protocol.  Patient will need clearance from his cardiologist to stop Eliquis 3 days prior to scheduled procedure.    Order Specific Question:   Where will this procedure be performed?    Answer:   [facility]   Follow-up plan:   Return for PRN R L5/S1 ESI , Blood Thinner Protocol.     RIGHT L5/S1 ESI 11/28/19 helped significantly, repeat as needed.    Recent Visits Date Type Provider Dept  11/28/19 Procedure visit Gillis Santa, MD [facility]  11/10/19 Office Visit Gillis Santa, MD [facility]  Showing recent visits within past 90 days and meeting all other requirements Today's Visits Date Type Provider Dept  01/02/20 Telemedicine Gillis Santa, MD [facility]  Showing today's visits and meeting all other requirements Future Appointments No visits were found meeting these  conditions. Showing future appointments within next 90 days and meeting all other requirements  I discussed the assessment and treatment plan with the patient. The patient was provided an opportunity to ask questions and all were answered. The patient agreed with the plan and demonstrated an understanding of the instructions.  Patient advised to call back or seek an in-person evaluation if the symptoms or condition worsens.  Duration of encounter:20 minutes.  Note by: Gillis Santa, MD Date: 01/02/2020; Time: 2:16 PM

## 2020-01-18 DIAGNOSIS — I4891 Unspecified atrial fibrillation: Secondary | ICD-10-CM | POA: Diagnosis not present

## 2020-01-18 DIAGNOSIS — E119 Type 2 diabetes mellitus without complications: Secondary | ICD-10-CM | POA: Diagnosis not present

## 2020-01-18 DIAGNOSIS — Z79899 Other long term (current) drug therapy: Secondary | ICD-10-CM | POA: Diagnosis not present

## 2020-01-18 DIAGNOSIS — R001 Bradycardia, unspecified: Secondary | ICD-10-CM | POA: Diagnosis not present

## 2020-01-18 DIAGNOSIS — I5023 Acute on chronic systolic (congestive) heart failure: Secondary | ICD-10-CM | POA: Diagnosis not present

## 2020-01-18 DIAGNOSIS — I10 Essential (primary) hypertension: Secondary | ICD-10-CM | POA: Diagnosis not present

## 2020-01-18 DIAGNOSIS — K219 Gastro-esophageal reflux disease without esophagitis: Secondary | ICD-10-CM | POA: Diagnosis not present

## 2020-01-18 DIAGNOSIS — R319 Hematuria, unspecified: Secondary | ICD-10-CM | POA: Diagnosis not present

## 2020-01-18 DIAGNOSIS — I429 Cardiomyopathy, unspecified: Secondary | ICD-10-CM | POA: Diagnosis not present

## 2020-01-23 ENCOUNTER — Other Ambulatory Visit: Payer: Self-pay | Admitting: Family Medicine

## 2020-01-23 DIAGNOSIS — E119 Type 2 diabetes mellitus without complications: Secondary | ICD-10-CM

## 2020-01-30 ENCOUNTER — Ambulatory Visit (INDEPENDENT_AMBULATORY_CARE_PROVIDER_SITE_OTHER): Payer: Medicare Other | Admitting: Family Medicine

## 2020-01-30 ENCOUNTER — Other Ambulatory Visit: Payer: Self-pay

## 2020-01-30 ENCOUNTER — Encounter: Payer: Self-pay | Admitting: Family Medicine

## 2020-01-30 VITALS — BP 128/62 | HR 58 | Temp 97.8°F | Resp 16 | Ht 67.0 in | Wt 159.1 lb

## 2020-01-30 DIAGNOSIS — R7989 Other specified abnormal findings of blood chemistry: Secondary | ICD-10-CM

## 2020-01-30 DIAGNOSIS — G8929 Other chronic pain: Secondary | ICD-10-CM

## 2020-01-30 DIAGNOSIS — D692 Other nonthrombocytopenic purpura: Secondary | ICD-10-CM | POA: Insufficient documentation

## 2020-01-30 DIAGNOSIS — E1169 Type 2 diabetes mellitus with other specified complication: Secondary | ICD-10-CM

## 2020-01-30 DIAGNOSIS — I4891 Unspecified atrial fibrillation: Secondary | ICD-10-CM

## 2020-01-30 DIAGNOSIS — E1122 Type 2 diabetes mellitus with diabetic chronic kidney disease: Secondary | ICD-10-CM

## 2020-01-30 DIAGNOSIS — I700 Atherosclerosis of aorta: Secondary | ICD-10-CM | POA: Insufficient documentation

## 2020-01-30 DIAGNOSIS — N1831 Chronic kidney disease, stage 3a: Secondary | ICD-10-CM

## 2020-01-30 DIAGNOSIS — M160 Bilateral primary osteoarthritis of hip: Secondary | ICD-10-CM

## 2020-01-30 DIAGNOSIS — L989 Disorder of the skin and subcutaneous tissue, unspecified: Secondary | ICD-10-CM | POA: Diagnosis not present

## 2020-01-30 DIAGNOSIS — I5022 Chronic systolic (congestive) heart failure: Secondary | ICD-10-CM | POA: Insufficient documentation

## 2020-01-30 DIAGNOSIS — R319 Hematuria, unspecified: Secondary | ICD-10-CM

## 2020-01-30 DIAGNOSIS — E785 Hyperlipidemia, unspecified: Secondary | ICD-10-CM

## 2020-01-30 DIAGNOSIS — E1121 Type 2 diabetes mellitus with diabetic nephropathy: Secondary | ICD-10-CM | POA: Insufficient documentation

## 2020-01-30 DIAGNOSIS — M5441 Lumbago with sciatica, right side: Secondary | ICD-10-CM

## 2020-01-30 DIAGNOSIS — N183 Chronic kidney disease, stage 3 unspecified: Secondary | ICD-10-CM

## 2020-01-30 LAB — CBC WITH DIFFERENTIAL/PLATELET
Absolute Monocytes: 620 cells/uL (ref 200–950)
Basophils Absolute: 62 cells/uL (ref 0–200)
Basophils Relative: 1 %
Eosinophils Absolute: 329 cells/uL (ref 15–500)
Eosinophils Relative: 5.3 %
HCT: 40.1 % (ref 38.5–50.0)
Hemoglobin: 13.4 g/dL (ref 13.2–17.1)
Lymphs Abs: 1426 cells/uL (ref 850–3900)
MCH: 31.3 pg (ref 27.0–33.0)
MCHC: 33.4 g/dL (ref 32.0–36.0)
MCV: 93.7 fL (ref 80.0–100.0)
MPV: 10.2 fL (ref 7.5–12.5)
Monocytes Relative: 10 %
Neutro Abs: 3763 cells/uL (ref 1500–7800)
Neutrophils Relative %: 60.7 %
Platelets: 219 10*3/uL (ref 140–400)
RBC: 4.28 10*6/uL (ref 4.20–5.80)
RDW: 13.1 % (ref 11.0–15.0)
Total Lymphocyte: 23 %
WBC: 6.2 10*3/uL (ref 3.8–10.8)

## 2020-01-30 LAB — COMPLETE METABOLIC PANEL WITH GFR
AG Ratio: 1.7 (calc) (ref 1.0–2.5)
ALT: 20 U/L (ref 9–46)
AST: 20 U/L (ref 10–35)
Albumin: 4.5 g/dL (ref 3.6–5.1)
Alkaline phosphatase (APISO): 74 U/L (ref 35–144)
BUN: 24 mg/dL (ref 7–25)
CO2: 31 mmol/L (ref 20–32)
Calcium: 10.2 mg/dL (ref 8.6–10.3)
Chloride: 101 mmol/L (ref 98–110)
Creat: 1.11 mg/dL (ref 0.70–1.18)
GFR, Est African American: 73 mL/min/{1.73_m2} (ref >=60)
GFR, Est Non African American: 63 mL/min/{1.73_m2} (ref >=60)
Globulin: 2.7 g/dL (calc) (ref 1.9–3.7)
Glucose, Bld: 169 mg/dL — ABNORMAL HIGH (ref 65–99)
Potassium: 5.3 mmol/L (ref 3.5–5.3)
Sodium: 139 mmol/L (ref 135–146)
Total Bilirubin: 0.8 mg/dL (ref 0.2–1.2)
Total Protein: 7.2 g/dL (ref 6.1–8.1)

## 2020-01-30 LAB — TSH: TSH: 13.73 mIU/L — ABNORMAL HIGH (ref 0.40–4.50)

## 2020-01-30 MED ORDER — ONETOUCH ULTRA VI STRP: ORAL_STRIP | 2 refills | Status: DC

## 2020-01-30 MED ORDER — METFORMIN HCL ER 500 MG PO TB24: 500.0000 mg | ORAL_TABLET | Freq: Every day | ORAL | 1 refills | Status: DC

## 2020-01-30 MED ORDER — ATORVASTATIN CALCIUM 10 MG PO TABS: 10.0000 mg | ORAL_TABLET | Freq: Every day | ORAL | 1 refills | Status: DC

## 2020-01-30 MED ORDER — METFORMIN HCL ER 500 MG PO TB24: 1000.0000 mg | ORAL_TABLET | Freq: Every day | ORAL | 1 refills | Status: DC

## 2020-01-30 NOTE — Progress Notes (Addendum)
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 09/19/2019  Falls in the past year? 0 1 0 0 1  Number falls in past yr: 0 1 - 0 1  Injury with Fall? 0 0 - 0 1     Functional Status Survey: Is the patient deaf or have difficulty hearing?: No Does the patient have difficulty seeing, even when wearing glasses/contacts?: No Does the patient have difficulty concentrating, remembering, or making decisions?: No Does the patient have difficulty walking or climbing stairs?: Yes Does the patient have difficulty  dressing or bathing?: No Does the patient have difficulty doing errands alone such as visiting a doctor's office or shopping?: No    Assessment & Plan  1. Dyslipidemia associated with type 2 diabetes mellitus (HCC)  - atorvastatin (LIPITOR) 10 MG tablet; Take 1 tablet (10 mg total) by mouth daily at 6 PM.  Dispense: 90 tablet; Refill: 1 - glucose blood (ONETOUCH ULTRA) test strip; Use as instructed  Dispense: 100 each; Refill: 2  2. Glomerular disorder associated with diabetes mellitus with stage 3 chronic kidney disease (HCC)  - metFORMIN (GLUCOPHAGE XR) 500 MG 24 hr tablet; Take 2 tablets (1,000 mg total) by mouth daily with breakfast.  Dispense: 180 tablet; Refill: 1  3. Senile purpura ([place])   4. Atherosclerosis of aorta ([place])   5. Chronic right-sided low back pain with right-sided sciatica   6. Primary osteoarthritis of both hips   7. Hematuria, unspecified type  - CBC with Differential/Platelet  8. Stage 3a chronic kidney disease (HCC)  - COMPLETE METABOLIC PANEL WITH GFR  9. Chronic systolic congestive heart failure ([place])   10. Elevated TSH  - TSH  11. Atrial fibrillation, controlled (HCC)  - TSH.

## 2020-01-30 NOTE — Addendum Note (Signed)
Addended by: Steele Sizer F on: 01/30/2020 09:55 AM   Modules accepted: Orders

## 2020-01-31 ENCOUNTER — Other Ambulatory Visit: Payer: Self-pay | Admitting: Family Medicine

## 2020-01-31 DIAGNOSIS — R1032 Left lower quadrant pain: Secondary | ICD-10-CM | POA: Diagnosis not present

## 2020-01-31 DIAGNOSIS — N202 Calculus of kidney with calculus of ureter: Secondary | ICD-10-CM | POA: Diagnosis not present

## 2020-01-31 DIAGNOSIS — R310 Gross hematuria: Secondary | ICD-10-CM | POA: Diagnosis not present

## 2020-01-31 DIAGNOSIS — E039 Hypothyroidism, unspecified: Secondary | ICD-10-CM

## 2020-01-31 MED ORDER — LEVOTHYROXINE SODIUM 25 MCG PO TABS: 25.0000 ug | ORAL_TABLET | Freq: Every day | ORAL | 0 refills | Status: DC

## 2020-02-01 ENCOUNTER — Telehealth: Payer: Self-pay

## 2020-02-01 ENCOUNTER — Other Ambulatory Visit: Payer: Self-pay | Admitting: Family Medicine

## 2020-02-01 DIAGNOSIS — R7989 Other specified abnormal findings of blood chemistry: Secondary | ICD-10-CM

## 2020-02-01 DIAGNOSIS — E039 Hypothyroidism, unspecified: Secondary | ICD-10-CM

## 2020-02-01 NOTE — Telephone Encounter (Signed)
Copied from [place] [id number]. Topic: General - Inquiry >> Feb 01, 2020 12:02 PM Gillis Ends D wrote: Reason for CRM: Patient's wife called and would like someone to go over his labs with her before she picks up the synthroid from the pharmacy. She cna be reached at [phone]. Please advise

## 2020-02-14 ENCOUNTER — Ambulatory Visit
Admission: RE | Admit: 2020-02-14 | Discharge: 2020-02-14 | Disposition: A | Discharge status: Home / Self Care | Payer: Medicare Other | Source: Ambulatory Visit | Attending: Urology | Admitting: Urology

## 2020-02-14 ENCOUNTER — Other Ambulatory Visit: Payer: Self-pay

## 2020-02-14 DIAGNOSIS — N200 Calculus of kidney: Secondary | ICD-10-CM | POA: Diagnosis not present

## 2020-02-14 DIAGNOSIS — R1032 Left lower quadrant pain: Secondary | ICD-10-CM | POA: Insufficient documentation

## 2020-02-14 MED ORDER — IOHEXOL 300 MG/ML  SOLN
125.0000 mL | Freq: Once | INTRAMUSCULAR | Status: AC | PRN
  Administered 2020-02-14: 110 mL via INTRAVENOUS

## 2020-02-17 DIAGNOSIS — R1032 Left lower quadrant pain: Secondary | ICD-10-CM | POA: Diagnosis not present

## 2020-02-17 DIAGNOSIS — R310 Gross hematuria: Secondary | ICD-10-CM | POA: Diagnosis not present

## 2020-02-17 DIAGNOSIS — Z23 Encounter for immunization: Secondary | ICD-10-CM | POA: Diagnosis not present

## 2020-02-21 ENCOUNTER — Other Ambulatory Visit
Admission: RE | Admit: 2020-02-21 | Discharge: 2020-02-21 | Disposition: A | Discharge status: Home / Self Care | Payer: Medicare Other | Source: Ambulatory Visit | Attending: Student | Admitting: Student

## 2020-02-21 DIAGNOSIS — I10 Essential (primary) hypertension: Secondary | ICD-10-CM | POA: Diagnosis not present

## 2020-02-21 DIAGNOSIS — E785 Hyperlipidemia, unspecified: Secondary | ICD-10-CM | POA: Diagnosis not present

## 2020-02-21 DIAGNOSIS — Z23 Encounter for immunization: Secondary | ICD-10-CM | POA: Diagnosis not present

## 2020-02-21 DIAGNOSIS — R001 Bradycardia, unspecified: Secondary | ICD-10-CM | POA: Diagnosis not present

## 2020-02-21 DIAGNOSIS — I700 Atherosclerosis of aorta: Secondary | ICD-10-CM | POA: Diagnosis not present

## 2020-02-21 DIAGNOSIS — I4891 Unspecified atrial fibrillation: Secondary | ICD-10-CM | POA: Insufficient documentation

## 2020-02-21 DIAGNOSIS — Z79899 Other long term (current) drug therapy: Secondary | ICD-10-CM | POA: Insufficient documentation

## 2020-02-21 DIAGNOSIS — I429 Cardiomyopathy, unspecified: Secondary | ICD-10-CM | POA: Diagnosis not present

## 2020-02-21 DIAGNOSIS — I5020 Unspecified systolic (congestive) heart failure: Secondary | ICD-10-CM | POA: Diagnosis not present

## 2020-02-21 DIAGNOSIS — Z01818 Encounter for other preprocedural examination: Secondary | ICD-10-CM | POA: Diagnosis not present

## 2020-02-21 DIAGNOSIS — N1831 Chronic kidney disease, stage 3a: Secondary | ICD-10-CM | POA: Diagnosis not present

## 2020-02-21 DIAGNOSIS — I110 Hypertensive heart disease with heart failure: Secondary | ICD-10-CM | POA: Diagnosis not present

## 2020-02-21 DIAGNOSIS — E1122 Type 2 diabetes mellitus with diabetic chronic kidney disease: Secondary | ICD-10-CM | POA: Diagnosis not present

## 2020-02-21 DIAGNOSIS — I5022 Chronic systolic (congestive) heart failure: Secondary | ICD-10-CM | POA: Diagnosis not present

## 2020-02-21 DIAGNOSIS — R310 Gross hematuria: Secondary | ICD-10-CM | POA: Diagnosis not present

## 2020-02-21 LAB — BRAIN NATRIURETIC PEPTIDE: B Natriuretic Peptide: 208.4 pg/mL — ABNORMAL HIGH (ref 0.0–100.0)

## 2020-02-22 DIAGNOSIS — I5020 Unspecified systolic (congestive) heart failure: Secondary | ICD-10-CM | POA: Insufficient documentation

## 2020-02-22 DIAGNOSIS — R001 Bradycardia, unspecified: Secondary | ICD-10-CM | POA: Insufficient documentation

## 2020-03-06 ENCOUNTER — Other Ambulatory Visit
Admission: RE | Admit: 2020-03-06 | Discharge: 2020-03-06 | Disposition: A | Discharge status: Home / Self Care | Payer: Medicare Other | Source: Ambulatory Visit | Attending: Internal Medicine | Admitting: Internal Medicine

## 2020-03-06 ENCOUNTER — Other Ambulatory Visit: Payer: Self-pay

## 2020-03-06 DIAGNOSIS — Z20822 Contact with and (suspected) exposure to covid-19: Secondary | ICD-10-CM | POA: Insufficient documentation

## 2020-03-06 DIAGNOSIS — Z01812 Encounter for preprocedural laboratory examination: Secondary | ICD-10-CM | POA: Insufficient documentation

## 2020-03-06 LAB — SARS CORONAVIRUS 2 (TAT 6-24 HRS): SARS Coronavirus 2: NEGATIVE

## 2020-03-08 ENCOUNTER — Encounter: Payer: Self-pay | Admitting: Internal Medicine

## 2020-03-08 ENCOUNTER — Other Ambulatory Visit: Payer: Self-pay

## 2020-03-08 ENCOUNTER — Observation Stay
Admission: RE | Admit: 2020-03-08 | Discharge: 2020-03-09 | Disposition: A | Discharge status: Home / Self Care | Payer: Medicare Other | Attending: Internal Medicine | Admitting: Internal Medicine

## 2020-03-08 ENCOUNTER — Encounter
Admission: RE | Disposition: A | Discharge status: Home / Self Care | Payer: Self-pay | Source: Home / Self Care | Attending: Internal Medicine

## 2020-03-08 DIAGNOSIS — I209 Angina pectoris, unspecified: Secondary | ICD-10-CM | POA: Diagnosis not present

## 2020-03-08 DIAGNOSIS — K219 Gastro-esophageal reflux disease without esophagitis: Secondary | ICD-10-CM | POA: Diagnosis not present

## 2020-03-08 DIAGNOSIS — I110 Hypertensive heart disease with heart failure: Secondary | ICD-10-CM | POA: Diagnosis not present

## 2020-03-08 DIAGNOSIS — N400 Enlarged prostate without lower urinary tract symptoms: Secondary | ICD-10-CM | POA: Insufficient documentation

## 2020-03-08 DIAGNOSIS — I2510 Atherosclerotic heart disease of native coronary artery without angina pectoris: Secondary | ICD-10-CM | POA: Insufficient documentation

## 2020-03-08 DIAGNOSIS — I5020 Unspecified systolic (congestive) heart failure: Principal | ICD-10-CM

## 2020-03-08 DIAGNOSIS — I4891 Unspecified atrial fibrillation: Secondary | ICD-10-CM | POA: Diagnosis not present

## 2020-03-08 DIAGNOSIS — I429 Cardiomyopathy, unspecified: Secondary | ICD-10-CM | POA: Diagnosis not present

## 2020-03-08 DIAGNOSIS — I5022 Chronic systolic (congestive) heart failure: Secondary | ICD-10-CM | POA: Diagnosis not present

## 2020-03-08 DIAGNOSIS — Z955 Presence of coronary angioplasty implant and graft: Secondary | ICD-10-CM

## 2020-03-08 HISTORY — PX: CORONARY STENT INTERVENTION: CATH118234

## 2020-03-08 HISTORY — PX: LEFT HEART CATH AND CORONARY ANGIOGRAPHY: CATH118249

## 2020-03-08 HISTORY — DX: Gastro-esophageal reflux disease without esophagitis: K21.9

## 2020-03-08 LAB — POCT ACTIVATED CLOTTING TIME
Activated Clotting Time: 285 seconds
Activated Clotting Time: 303 seconds

## 2020-03-08 LAB — GLUCOSE, CAPILLARY: Glucose-Capillary: 139 mg/dL — ABNORMAL HIGH (ref 70–99)

## 2020-03-08 SURGERY — LEFT HEART CATH AND CORONARY ANGIOGRAPHY
Anesthesia: Moderate Sedation

## 2020-03-08 MED ORDER — SODIUM CHLORIDE 0.9 % WEIGHT BASED INFUSION: 3.0000 mL/kg/h | INTRAVENOUS | Status: AC

## 2020-03-08 MED ORDER — MIDAZOLAM HCL 2 MG/2ML IJ SOLN
INTRAMUSCULAR | Status: DC | PRN
  Administered 2020-03-08: 1 mg via INTRAVENOUS

## 2020-03-08 MED ORDER — IOHEXOL 300 MG/ML  SOLN
INTRAMUSCULAR | Status: DC | PRN
  Administered 2020-03-08: 130 mL

## 2020-03-08 MED ORDER — SODIUM CHLORIDE 0.9 % WEIGHT BASED INFUSION
3.0000 mL/kg/h | INTRAVENOUS | Status: DC
  Administered 2020-03-08: 3 mL/kg/h via INTRAVENOUS

## 2020-03-08 MED ORDER — FENTANYL CITRATE (PF) 100 MCG/2ML IJ SOLN
INTRAMUSCULAR | Status: DC | PRN
  Administered 2020-03-08: 25 ug via INTRAVENOUS

## 2020-03-08 MED ORDER — ASPIRIN 81 MG PO CHEW
CHEWABLE_TABLET | ORAL | Status: DC | PRN
  Administered 2020-03-08: 243 mg via ORAL

## 2020-03-08 MED ORDER — SODIUM CHLORIDE 0.9 % IV SOLN: 250.0000 mL | INTRAVENOUS | Status: DC | PRN

## 2020-03-08 MED ORDER — CLOPIDOGREL BISULFATE 75 MG PO TABS
75.0000 mg | ORAL_TABLET | Freq: Every day | ORAL | Status: DC
  Administered 2020-03-09: 75 mg via ORAL
  Filled 2020-03-08: qty 1

## 2020-03-08 MED ORDER — ONDANSETRON HCL 4 MG/2ML IJ SOLN: 4.0000 mg | Freq: Four times a day (QID) | INTRAMUSCULAR | Status: DC | PRN

## 2020-03-08 MED ORDER — SODIUM CHLORIDE 0.9% FLUSH: 3.0000 mL | INTRAVENOUS | Status: DC | PRN

## 2020-03-08 MED ORDER — HEPARIN SODIUM (PORCINE) 1000 UNIT/ML IJ SOLN
INTRAMUSCULAR | Status: DC | PRN
  Administered 2020-03-08: 3000 [IU] via INTRAVENOUS
  Administered 2020-03-08: 3500 [IU] via INTRAVENOUS
  Administered 2020-03-08: 4000 [IU] via INTRAVENOUS

## 2020-03-08 MED ORDER — HEPARIN (PORCINE) IN NACL 1000-0.9 UT/500ML-% IV SOLN
INTRAVENOUS | Status: AC
  Filled 2020-03-08: qty 1000

## 2020-03-08 MED ORDER — SODIUM CHLORIDE 0.9 % WEIGHT BASED INFUSION
1.0000 mL/kg/h | INTRAVENOUS | Status: AC
  Administered 2020-03-08: 1 mL/kg/h via INTRAVENOUS

## 2020-03-08 MED ORDER — HEPARIN (PORCINE) IN NACL 1000-0.9 UT/500ML-% IV SOLN
INTRAVENOUS | Status: DC | PRN
  Administered 2020-03-08: 500 mL

## 2020-03-08 MED ORDER — SODIUM CHLORIDE 0.9% FLUSH
3.0000 mL | Freq: Two times a day (BID) | INTRAVENOUS | Status: DC
  Administered 2020-03-08: 3 mL via INTRAVENOUS

## 2020-03-08 MED ORDER — VERAPAMIL HCL 2.5 MG/ML IV SOLN
INTRAVENOUS | Status: AC
  Filled 2020-03-08: qty 2

## 2020-03-08 MED ORDER — ACETAMINOPHEN 325 MG PO TABS: 650.0000 mg | ORAL_TABLET | ORAL | Status: DC | PRN

## 2020-03-08 MED ORDER — SODIUM CHLORIDE 0.9 % WEIGHT BASED INFUSION: 1.0000 mL/kg/h | INTRAVENOUS | Status: DC

## 2020-03-08 MED ORDER — MIDAZOLAM HCL 2 MG/2ML IJ SOLN
INTRAMUSCULAR | Status: AC
  Filled 2020-03-08: qty 2

## 2020-03-08 MED ORDER — ASPIRIN 81 MG PO CHEW: 81.0000 mg | CHEWABLE_TABLET | ORAL | Status: DC

## 2020-03-08 MED ORDER — CLOPIDOGREL BISULFATE 75 MG PO TABS
ORAL_TABLET | ORAL | Status: DC | PRN
  Administered 2020-03-08: 600 mg via ORAL

## 2020-03-08 MED ORDER — SODIUM CHLORIDE 0.9% FLUSH: 3.0000 mL | Freq: Two times a day (BID) | INTRAVENOUS | Status: DC

## 2020-03-08 MED ORDER — ASPIRIN 81 MG PO CHEW
81.0000 mg | CHEWABLE_TABLET | Freq: Every day | ORAL | Status: DC
  Administered 2020-03-09: 81 mg via ORAL
  Filled 2020-03-08: qty 1

## 2020-03-08 MED ORDER — HYDRALAZINE HCL 20 MG/ML IJ SOLN: 10.0000 mg | INTRAMUSCULAR | Status: AC | PRN

## 2020-03-08 MED ORDER — ASPIRIN 81 MG PO CHEW
CHEWABLE_TABLET | ORAL | Status: AC
  Filled 2020-03-08: qty 3

## 2020-03-08 MED ORDER — HEPARIN SODIUM (PORCINE) 1000 UNIT/ML IJ SOLN
INTRAMUSCULAR | Status: AC
  Filled 2020-03-08: qty 1

## 2020-03-08 MED ORDER — LABETALOL HCL 5 MG/ML IV SOLN: 10.0000 mg | INTRAVENOUS | Status: AC | PRN

## 2020-03-08 MED ORDER — VERAPAMIL HCL 2.5 MG/ML IV SOLN
INTRAVENOUS | Status: DC | PRN
  Administered 2020-03-08: 2.5 mg via INTRAVENOUS

## 2020-03-08 MED ORDER — FENTANYL CITRATE (PF) 100 MCG/2ML IJ SOLN
INTRAMUSCULAR | Status: AC
  Filled 2020-03-08: qty 2

## 2020-03-08 MED ORDER — LIDOCAINE HCL (PF) 1 % IJ SOLN
INTRAMUSCULAR | Status: DC | PRN
  Administered 2020-03-08: 2 mL

## 2020-03-08 MED ORDER — CLOPIDOGREL BISULFATE 75 MG PO TABS
ORAL_TABLET | ORAL | Status: AC
  Filled 2020-03-08: qty 8

## 2020-03-08 MED ORDER — LIDOCAINE HCL (PF) 1 % IJ SOLN
INTRAMUSCULAR | Status: AC
  Filled 2020-03-08: qty 30

## 2020-03-08 MED ORDER — ASPIRIN 81 MG PO CHEW
CHEWABLE_TABLET | ORAL | Status: AC
  Filled 2020-03-08: qty 1

## 2020-03-08 SURGICAL SUPPLY — 16 items
BALLN TREK RX 2.5X15 (BALLOONS) ×3
BALLN ~~LOC~~ EUPHORA RX 2.75X15 (BALLOONS) ×3
BALLOON TREK RX 2.5X15 (BALLOONS) ×2 IMPLANT
BALLOON ~~LOC~~ EUPHORA RX 2.75X15 (BALLOONS) ×2 IMPLANT
CATH INFINITI 5 FR JL3.5 (CATHETERS) ×3 IMPLANT
CATH INFINITI JR4 5F (CATHETERS) ×3 IMPLANT
CATH VISTA GUIDE 6FR XB3.5 (CATHETERS) ×3 IMPLANT
DEVICE RAD TR BAND REGULAR (VASCULAR PRODUCTS) ×3 IMPLANT
GLIDESHEATH SLEND SS 6F .021 (SHEATH) ×3 IMPLANT
GUIDEWIRE INQWIRE 1.5J.035X260 (WIRE) ×2 IMPLANT
INQWIRE 1.5J .035X260CM (WIRE) ×3
KIT ENCORE 26 ADVANTAGE (KITS) ×3 IMPLANT
KIT MANI 3VAL PERCEP (MISCELLANEOUS) ×3 IMPLANT
PACK CARDIAC CATH (CUSTOM PROCEDURE TRAY) ×3 IMPLANT
STENT RESOLUTE ONYX 2.75X18 (Permanent Stent) ×3 IMPLANT
WIRE G HI TQ BMW 190 (WIRE) ×3 IMPLANT

## 2020-03-08 NOTE — Plan of Care (Signed)

## 2020-03-09 ENCOUNTER — Encounter: Payer: Self-pay | Admitting: Internal Medicine

## 2020-03-09 DIAGNOSIS — I5020 Unspecified systolic (congestive) heart failure: Secondary | ICD-10-CM | POA: Diagnosis not present

## 2020-03-09 DIAGNOSIS — I429 Cardiomyopathy, unspecified: Secondary | ICD-10-CM | POA: Diagnosis not present

## 2020-03-09 DIAGNOSIS — I2510 Atherosclerotic heart disease of native coronary artery without angina pectoris: Secondary | ICD-10-CM | POA: Diagnosis not present

## 2020-03-09 DIAGNOSIS — N400 Enlarged prostate without lower urinary tract symptoms: Secondary | ICD-10-CM | POA: Diagnosis not present

## 2020-03-09 DIAGNOSIS — I110 Hypertensive heart disease with heart failure: Secondary | ICD-10-CM | POA: Diagnosis not present

## 2020-03-09 DIAGNOSIS — I4891 Unspecified atrial fibrillation: Secondary | ICD-10-CM | POA: Diagnosis not present

## 2020-03-09 MED ORDER — CLOPIDOGREL BISULFATE 75 MG PO TABS: 75.0000 mg | ORAL_TABLET | Freq: Every day | ORAL | 6 refills | Status: DC

## 2020-03-09 NOTE — Discharge Instructions (Signed)
Continue Plavix and not interrupted 75 mg a day Aspirin 81 mg a day for 30 days then discontinue Resume Eliquis in 48 hours twice a day Follow-up in the office in 2 weeks

## 2020-03-09 NOTE — Consult Note (Signed)
   Heart Failure Nurse Navigator Note  HFrEF 35 to 40%.  By echocardiogram performed in December 2021 ejection fraction noted to be 20 to 25%.  He was admitted for elective left heart catheterization.  Comorbidities:   Coronary artery disease with stent placement Atrial fibrillation Hypertension GERD  Medications:  Aspirin 81 mg daily Plavix 75 mg daily Eliquis 5 mg 2 times a day    Assessment:  General-she is awake and alert sitting up in the chair at bedside in no acute distress.   HEENT-pupils are equal, no JVD noted.  Cardiac-heart tones of regular rate and rhythm.  Chest-lungs are clear to posterior auscultation.  Abdomen-soft nontender  Legs-there is no lower extremity edema noted.  Psych-he is pleasant and appropriate.   Neurologic-speech is clear moves all extremities.    Initial meeting with patient and his wife.  She states that home that she already uses a low-sodium seasoning.  They very rarely eat out in restaurants, she states when she makes homemade soup that she uses for low sodium broth.  Also discussed the importance of weighing daily and reporting 2 to 3 pound weight gain overnight or 5 pounds within a week.  Also discussed reporting symptoms of increasing shortness of breath, PND orthopnea.  Discussed fluid restrictions, she states that he does not like to drink water does drink coffee throughout the day.  Instructed to drink no more than 64 ounces in a days time.  Discussed the importance of keeping appointments at the outpatient heart failure clinic as this been shown to reduce readmissions to the hospital for heart failure.  They have an appointment for March 3.  He was given the heart failure booklet along with zone magnet and information about the heart failure clinic.   Pricilla Riffle RN, CHFN

## 2020-03-09 NOTE — Final Progress Note (Signed)
[facility]    SUBJECTIVE: Patient seems to doing reasonably well status post cardiac cath as an outpatient yesterday because of positive functional study and was well his heart failure now feels reasonably well procedure was done radially patient feels well enough to go home denies any pain or discomfort   Vitals:   03/08/20 2005 03/09/20 0349 03/09/20 0448 03/09/20 0731  BP: (!) 117/50 (!) 136/55  139/60  Pulse: (!) 55 (!) 53  (!) 56  Resp: 16 20  18  Temp: 97.7 F (36.5 C) 97.6 F (36.4 C)  98 F (36.7 C)  TempSrc: Oral Oral  Oral  SpO2: 99% 98%  100%  Weight:   74.6 kg   Height:         Intake/Output Summary (Last 24 hours) at 03/09/2020 0921 Last data filed at 03/09/2020 0713 Gross per 24 hour  Intake 1145.32 ml  Output 700 ml  Net 445.32 ml      PHYSICAL EXAM  General: Well developed, well nourished, in no acute distress HEENT:  Normocephalic and atramatic Neck:  No JVD.  Lungs: Clear bilaterally to auscultation and percussion. Heart: HRRR . Normal S1 and S2 without gallops or murmurs.  Abdomen: Bowel sounds are positive, abdomen soft and non-tender  Msk:  Back normal, normal gait. Normal strength and tone for age. Extremities: No clubbing, cyanosis or edema.  Right radial wrist bandaged minimal bruising Neuro: Alert and oriented X 3. Psych:  Good affect, responds appropriately   LABS: Basic Metabolic Panel: No results for input(s): NA, K, CL, CO2, GLUCOSE, BUN, CREATININE, CALCIUM, MG, PHOS in the last 72 hours. Liver Function Tests: No results for input(s): AST, ALT, ALKPHOS, BILITOT, PROT, ALBUMIN in the last 72 hours. No results for input(s): LIPASE, AMYLASE in the last 72 hours. CBC: No results for input(s): WBC, NEUTROABS, HGB, HCT, MCV, PLT in the last 72 hours. Cardiac Enzymes: No results for input(s): CKTOTAL, CKMB, CKMBINDEX, TROPONINI in the last 72 hours. BNP: Invalid input(s): POCBNP D-Dimer: No results for input(s): DDIMER in the last 72  hours. Hemoglobin A1C: No results for input(s): HGBA1C in the last 72 hours. Fasting Lipid Panel: No results for input(s): CHOL, HDL, LDLCALC, TRIG, CHOLHDL, LDLDIRECT in the last 72 hours. Thyroid Function Tests: No results for input(s): TSH, T4TOTAL, T3FREE, THYROIDAB in the last 72 hours.  Invalid input(s): FREET3 Anemia Panel: No results for input(s): VITAMINB12, FOLATE, FERRITIN, TIBC, IRON, RETICCTPCT in the last 72 hours.  CARDIAC CATHETERIZATION  Result Date: 03/08/2020  Mid LAD lesion is 75% stenosed.  Post intervention, there is a 0% residual stenosis.  A stent was successfully placed.  Moderately depressed left ventricular function ejection fraction between 35 and 40%  Circumflex free of disease  RCA free of disease  PCI and stent DES to proximal LAD  Conclusion Successful diagnostic cardiac cath and radial position Left ventricular function was globally depressed moderately 35 to 40% Coronaries Left main large free of disease LAD large rhythm proximal to mid 75% lesion TIMI-3 flow Circumflex medium in size free of disease RCA medium size free of disease Intervention Successful PCI and stent of proximal to mid LAD with DES 2.75 x 18 mm resolute Onyx Postdilated with a 2.75 x 15 mm [place] euphoria to 16 atm Lesion was reduced from 75 down to 0% TIMI-3 flow was maintained throughout the case TR band was applied to right radial area Patient is expected to take aspirin 81 mg for 4 weeks Patient instructed to take Plavix 75   mg a day for at least 12 months Patient is instructed to resume Eliquis and 3 to 5 days     Echo previous echo in the office as an outpatient with improve left ventricular function around 40 to 45%  TELEMETRY: Normal sinus rhythm nonspecific findings:  ASSESSMENT AND PLAN:  Active Problems:   S/P drug eluting coronary stent placement Congestive heart failure Cardiomyopathy nonischemic Coronary artery disease Atrial  fibrillation Hypertension BPH GERD  Plan Status post cardiac cath PCI and stent to LAD with DES continue Plavix aspirin Recommend triple therapy for 30 days then discontinue aspirin Maintain Eliquis and Plavix for at least 12 months Continue heart failure therapy losartan Lasix Coreg Increase activity with routine daily exercise Follow-up with cardiac rehab Continue diabetes management maintain Metformin Follow-up with cardiology in 2 weeks   Dwayne D Callwood, MD 03/09/2020 9:21 AM     

## 2020-03-09 NOTE — Progress Notes (Signed)
Discharge teaching reviewed at bedside with patient and wife Billy Franco. Questions answered and also I reached out to MD [facility] for pain medication clarification for patient's arthritis. MD Callwood at bedside at this time answering additional questions for patient. Timsy RN to review cardiac teaching at beside when [facility] finished. Patient will be ready for discharge home once teachings complete.

## 2020-03-13 ENCOUNTER — Telehealth: Payer: Self-pay

## 2020-03-15 DIAGNOSIS — E039 Hypothyroidism, unspecified: Secondary | ICD-10-CM | POA: Diagnosis not present

## 2020-03-16 ENCOUNTER — Other Ambulatory Visit: Payer: Self-pay

## 2020-03-16 LAB — TSH: TSH: 17.75 mIU/L — ABNORMAL HIGH (ref 0.40–4.50)

## 2020-03-16 MED ORDER — LEVOTHYROXINE SODIUM 25 MCG PO TABS
25.0000 ug | ORAL_TABLET | Freq: Every day | ORAL | 0 refills | Status: DC
Start: 2020-03-16 — End: 2020-06-21

## 2020-03-16 NOTE — Progress Notes (Signed)
Spoke with patient's daughter and advised per Dr. Ancil Boozer to have him take his thyroid medication every day and we will see him back in April to recheck his levels at that time.

## 2020-03-19 DIAGNOSIS — E119 Type 2 diabetes mellitus without complications: Secondary | ICD-10-CM | POA: Diagnosis not present

## 2020-03-19 LAB — HM DIABETES EYE EXAM

## 2020-03-22 ENCOUNTER — Ambulatory Visit: Payer: Medicare Other | Admitting: Family

## 2020-03-27 DIAGNOSIS — I10 Essential (primary) hypertension: Secondary | ICD-10-CM | POA: Diagnosis not present

## 2020-03-27 DIAGNOSIS — E785 Hyperlipidemia, unspecified: Secondary | ICD-10-CM | POA: Diagnosis not present

## 2020-03-27 DIAGNOSIS — E782 Mixed hyperlipidemia: Secondary | ICD-10-CM | POA: Diagnosis not present

## 2020-03-27 DIAGNOSIS — N1831 Chronic kidney disease, stage 3a: Secondary | ICD-10-CM | POA: Diagnosis not present

## 2020-03-27 DIAGNOSIS — E1122 Type 2 diabetes mellitus with diabetic chronic kidney disease: Secondary | ICD-10-CM | POA: Diagnosis not present

## 2020-03-27 DIAGNOSIS — I700 Atherosclerosis of aorta: Secondary | ICD-10-CM | POA: Diagnosis not present

## 2020-03-27 DIAGNOSIS — R001 Bradycardia, unspecified: Secondary | ICD-10-CM | POA: Diagnosis not present

## 2020-03-27 DIAGNOSIS — I509 Heart failure, unspecified: Secondary | ICD-10-CM | POA: Diagnosis not present

## 2020-03-27 DIAGNOSIS — I429 Cardiomyopathy, unspecified: Secondary | ICD-10-CM | POA: Diagnosis not present

## 2020-03-27 DIAGNOSIS — I2510 Atherosclerotic heart disease of native coronary artery without angina pectoris: Secondary | ICD-10-CM | POA: Diagnosis not present

## 2020-03-27 DIAGNOSIS — K219 Gastro-esophageal reflux disease without esophagitis: Secondary | ICD-10-CM | POA: Diagnosis not present

## 2020-03-27 DIAGNOSIS — I4891 Unspecified atrial fibrillation: Secondary | ICD-10-CM | POA: Diagnosis not present

## 2020-03-28 ENCOUNTER — Other Ambulatory Visit: Payer: Self-pay | Admitting: Family Medicine

## 2020-03-28 DIAGNOSIS — E785 Hyperlipidemia, unspecified: Secondary | ICD-10-CM

## 2020-03-28 DIAGNOSIS — E1169 Type 2 diabetes mellitus with other specified complication: Secondary | ICD-10-CM

## 2020-04-07 ENCOUNTER — Other Ambulatory Visit: Payer: Self-pay | Admitting: Family Medicine

## 2020-04-07 DIAGNOSIS — M160 Bilateral primary osteoarthritis of hip: Secondary | ICD-10-CM

## 2020-04-07 DIAGNOSIS — G8929 Other chronic pain: Secondary | ICD-10-CM

## 2020-04-07 DIAGNOSIS — M47816 Spondylosis without myelopathy or radiculopathy, lumbar region: Secondary | ICD-10-CM

## 2020-04-20 ENCOUNTER — Other Ambulatory Visit: Payer: Self-pay | Admitting: Family Medicine

## 2020-04-20 DIAGNOSIS — E785 Hyperlipidemia, unspecified: Secondary | ICD-10-CM

## 2020-04-20 DIAGNOSIS — E1169 Type 2 diabetes mellitus with other specified complication: Secondary | ICD-10-CM

## 2020-04-24 ENCOUNTER — Other Ambulatory Visit: Payer: Self-pay | Admitting: Family Medicine
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
## 2020-04-30 ENCOUNTER — Other Ambulatory Visit: Payer: Self-pay

## 2020-04-30 ENCOUNTER — Ambulatory Visit (INDEPENDENT_AMBULATORY_CARE_PROVIDER_SITE_OTHER): Payer: Medicare Other | Admitting: Family Medicine

## 2020-04-30 ENCOUNTER — Encounter: Payer: Self-pay | Admitting: Family Medicine

## 2020-04-30 VITALS — BP 126/70 | HR 87 | Temp 98.0°F | Resp 16 | Ht 67.0 in | Wt 164.0 lb

## 2020-04-30 DIAGNOSIS — M5441 Lumbago with sciatica, right side: Secondary | ICD-10-CM

## 2020-04-30 DIAGNOSIS — I4891 Unspecified atrial fibrillation: Secondary | ICD-10-CM

## 2020-04-30 DIAGNOSIS — I5022 Chronic systolic (congestive) heart failure: Secondary | ICD-10-CM | POA: Diagnosis not present

## 2020-04-30 DIAGNOSIS — E039 Hypothyroidism, unspecified: Secondary | ICD-10-CM | POA: Diagnosis not present

## 2020-04-30 DIAGNOSIS — E1122 Type 2 diabetes mellitus with diabetic chronic kidney disease: Secondary | ICD-10-CM | POA: Diagnosis not present

## 2020-04-30 DIAGNOSIS — I700 Atherosclerosis of aorta: Secondary | ICD-10-CM

## 2020-04-30 DIAGNOSIS — M160 Bilateral primary osteoarthritis of hip: Secondary | ICD-10-CM | POA: Diagnosis not present

## 2020-04-30 DIAGNOSIS — E785 Hyperlipidemia, unspecified: Secondary | ICD-10-CM | POA: Diagnosis not present

## 2020-04-30 DIAGNOSIS — M109 Gout, unspecified: Secondary | ICD-10-CM

## 2020-04-30 DIAGNOSIS — G8929 Other chronic pain: Secondary | ICD-10-CM

## 2020-04-30 DIAGNOSIS — D692 Other nonthrombocytopenic purpura: Secondary | ICD-10-CM

## 2020-04-30 DIAGNOSIS — N1831 Chronic kidney disease, stage 3a: Secondary | ICD-10-CM

## 2020-04-30 DIAGNOSIS — I129 Hypertensive chronic kidney disease with stage 1 through stage 4 chronic kidney disease, or unspecified chronic kidney disease: Secondary | ICD-10-CM

## 2020-04-30 DIAGNOSIS — E1169 Type 2 diabetes mellitus with other specified complication: Secondary | ICD-10-CM

## 2020-04-30 MED ORDER — METFORMIN HCL ER 500 MG PO TB24: 1000.0000 mg | ORAL_TABLET | Freq: Every day | ORAL | 1 refills | Status: DC

## 2020-04-30 MED ORDER — ATORVASTATIN CALCIUM 10 MG PO TABS: 10.0000 mg | ORAL_TABLET | Freq: Every day | ORAL | 1 refills | Status: DC

## 2020-05-01 ENCOUNTER — Other Ambulatory Visit: Payer: Self-pay | Admitting: Family Medicine

## 2020-05-01 DIAGNOSIS — E039 Hypothyroidism, unspecified: Secondary | ICD-10-CM

## 2020-05-01 LAB — HEMOGLOBIN A1C
Hgb A1c MFr Bld: 6.5 % of total Hgb — ABNORMAL HIGH (ref <5.7)
Mean Plasma Glucose: 140 mg/dL
eAG (mmol/L): 7.7 mmol/L

## 2020-05-01 LAB — BASIC METABOLIC PANEL WITH GFR
BUN: 24 mg/dL (ref 7–25)
CO2: 30 mmol/L (ref 20–32)
Calcium: 9.4 mg/dL (ref 8.6–10.3)
Chloride: 105 mmol/L (ref 98–110)
Creat: 1.07 mg/dL (ref 0.70–1.18)
GFR, Est African American: 76 mL/min/{1.73_m2} (ref >=60)
GFR, Est Non African American: 66 mL/min/{1.73_m2} (ref >=60)
Glucose, Bld: 141 mg/dL — ABNORMAL HIGH (ref 65–99)
Potassium: 4.3 mmol/L (ref 3.5–5.3)
Sodium: 143 mmol/L (ref 135–146)

## 2020-05-01 LAB — TSH: TSH: 7.97 mIU/L — ABNORMAL HIGH (ref 0.40–4.50)

## 2020-05-14 DIAGNOSIS — I429 Cardiomyopathy, unspecified: Secondary | ICD-10-CM | POA: Diagnosis not present

## 2020-05-14 DIAGNOSIS — I509 Heart failure, unspecified: Secondary | ICD-10-CM | POA: Diagnosis not present

## 2020-05-14 DIAGNOSIS — I2510 Atherosclerotic heart disease of native coronary artery without angina pectoris: Secondary | ICD-10-CM | POA: Diagnosis not present

## 2020-05-21 ENCOUNTER — Telehealth: Payer: Self-pay

## 2020-05-21 DIAGNOSIS — R001 Bradycardia, unspecified: Secondary | ICD-10-CM | POA: Diagnosis not present

## 2020-05-21 DIAGNOSIS — I10 Essential (primary) hypertension: Secondary | ICD-10-CM | POA: Diagnosis not present

## 2020-05-21 DIAGNOSIS — R319 Hematuria, unspecified: Secondary | ICD-10-CM | POA: Diagnosis not present

## 2020-05-21 DIAGNOSIS — I4891 Unspecified atrial fibrillation: Secondary | ICD-10-CM | POA: Diagnosis not present

## 2020-05-21 DIAGNOSIS — E1122 Type 2 diabetes mellitus with diabetic chronic kidney disease: Secondary | ICD-10-CM | POA: Diagnosis not present

## 2020-05-21 DIAGNOSIS — E782 Mixed hyperlipidemia: Secondary | ICD-10-CM | POA: Diagnosis not present

## 2020-05-21 DIAGNOSIS — I429 Cardiomyopathy, unspecified: Secondary | ICD-10-CM | POA: Diagnosis not present

## 2020-05-21 DIAGNOSIS — N1831 Chronic kidney disease, stage 3a: Secondary | ICD-10-CM | POA: Diagnosis not present

## 2020-05-21 DIAGNOSIS — I5022 Chronic systolic (congestive) heart failure: Secondary | ICD-10-CM | POA: Diagnosis not present

## 2020-05-21 DIAGNOSIS — K219 Gastro-esophageal reflux disease without esophagitis: Secondary | ICD-10-CM | POA: Diagnosis not present

## 2020-05-21 NOTE — Telephone Encounter (Signed)
Copied from [place] [id number]. Topic: General - Other >> May 21, 2020 11:56 AM Yvette Rack wrote: Reason for CRM: Pt wife requests lab order to have pt cholesterol checked.

## 2020-06-01 DIAGNOSIS — J40 Bronchitis, not specified as acute or chronic: Secondary | ICD-10-CM | POA: Diagnosis not present

## 2020-06-01 DIAGNOSIS — J019 Acute sinusitis, unspecified: Secondary | ICD-10-CM | POA: Diagnosis not present

## 2020-06-01 DIAGNOSIS — Z03818 Encounter for observation for suspected exposure to other biological agents ruled out: Secondary | ICD-10-CM | POA: Diagnosis not present

## 2020-06-20 DIAGNOSIS — E039 Hypothyroidism, unspecified: Secondary | ICD-10-CM | POA: Diagnosis not present

## 2020-06-21 ENCOUNTER — Other Ambulatory Visit: Payer: Self-pay | Admitting: Family Medicine

## 2020-06-21 LAB — TSH: TSH: 4.71 mIU/L — ABNORMAL HIGH (ref 0.40–4.50)

## 2020-06-21 MED ORDER — LEVOTHYROXINE SODIUM 25 MCG PO TABS: 25.0000 ug | ORAL_TABLET | Freq: Every day | ORAL | 1 refills | Status: DC

## 2020-06-25 DIAGNOSIS — D2262 Melanocytic nevi of left upper limb, including shoulder: Secondary | ICD-10-CM | POA: Diagnosis not present

## 2020-06-25 DIAGNOSIS — L570 Actinic keratosis: Secondary | ICD-10-CM | POA: Diagnosis not present

## 2020-06-25 DIAGNOSIS — D485 Neoplasm of uncertain behavior of skin: Secondary | ICD-10-CM | POA: Diagnosis not present

## 2020-06-25 DIAGNOSIS — D0461 Carcinoma in situ of skin of right upper limb, including shoulder: Secondary | ICD-10-CM | POA: Diagnosis not present

## 2020-06-25 DIAGNOSIS — D2271 Melanocytic nevi of right lower limb, including hip: Secondary | ICD-10-CM | POA: Diagnosis not present

## 2020-06-25 DIAGNOSIS — D225 Melanocytic nevi of trunk: Secondary | ICD-10-CM | POA: Diagnosis not present

## 2020-06-25 DIAGNOSIS — X32XXXA Exposure to sunlight, initial encounter: Secondary | ICD-10-CM | POA: Diagnosis not present

## 2020-06-25 DIAGNOSIS — D2261 Melanocytic nevi of right upper limb, including shoulder: Secondary | ICD-10-CM | POA: Diagnosis not present

## 2020-06-25 DIAGNOSIS — Z85828 Personal history of other malignant neoplasm of skin: Secondary | ICD-10-CM | POA: Diagnosis not present

## 2020-07-12 DIAGNOSIS — R110 Nausea: Secondary | ICD-10-CM | POA: Diagnosis not present

## 2020-07-12 DIAGNOSIS — M160 Bilateral primary osteoarthritis of hip: Secondary | ICD-10-CM | POA: Diagnosis not present

## 2020-07-12 DIAGNOSIS — R140 Abdominal distension (gaseous): Secondary | ICD-10-CM | POA: Diagnosis not present

## 2020-07-12 DIAGNOSIS — U071 COVID-19: Secondary | ICD-10-CM | POA: Diagnosis not present

## 2020-07-12 DIAGNOSIS — R630 Anorexia: Secondary | ICD-10-CM | POA: Diagnosis not present

## 2020-07-12 DIAGNOSIS — D649 Anemia, unspecified: Secondary | ICD-10-CM | POA: Diagnosis not present

## 2020-07-12 DIAGNOSIS — N179 Acute kidney failure, unspecified: Secondary | ICD-10-CM | POA: Diagnosis not present

## 2020-07-12 DIAGNOSIS — R059 Cough, unspecified: Secondary | ICD-10-CM | POA: Diagnosis not present

## 2020-07-12 DIAGNOSIS — Z03818 Encounter for observation for suspected exposure to other biological agents ruled out: Secondary | ICD-10-CM | POA: Diagnosis not present

## 2020-07-12 DIAGNOSIS — R634 Abnormal weight loss: Secondary | ICD-10-CM | POA: Diagnosis not present

## 2020-07-13 ENCOUNTER — Emergency Department
Admission: EM | Admit: 2020-07-13 | Discharge: 2020-07-13 | Disposition: A | Discharge status: Home / Self Care | Payer: Medicare Other | Attending: Emergency Medicine | Admitting: Emergency Medicine

## 2020-07-13 ENCOUNTER — Emergency Department: Payer: Medicare Other

## 2020-07-13 ENCOUNTER — Other Ambulatory Visit: Payer: Self-pay

## 2020-07-13 DIAGNOSIS — U071 COVID-19: Secondary | ICD-10-CM | POA: Insufficient documentation

## 2020-07-13 DIAGNOSIS — E860 Dehydration: Secondary | ICD-10-CM | POA: Insufficient documentation

## 2020-07-13 DIAGNOSIS — Z79899 Other long term (current) drug therapy: Secondary | ICD-10-CM | POA: Diagnosis not present

## 2020-07-13 DIAGNOSIS — E785 Hyperlipidemia, unspecified: Secondary | ICD-10-CM | POA: Insufficient documentation

## 2020-07-13 DIAGNOSIS — E1122 Type 2 diabetes mellitus with diabetic chronic kidney disease: Secondary | ICD-10-CM | POA: Diagnosis not present

## 2020-07-13 DIAGNOSIS — I5022 Chronic systolic (congestive) heart failure: Secondary | ICD-10-CM | POA: Insufficient documentation

## 2020-07-13 DIAGNOSIS — R0602 Shortness of breath: Secondary | ICD-10-CM | POA: Diagnosis not present

## 2020-07-13 DIAGNOSIS — R059 Cough, unspecified: Secondary | ICD-10-CM | POA: Diagnosis present

## 2020-07-13 DIAGNOSIS — E1169 Type 2 diabetes mellitus with other specified complication: Secondary | ICD-10-CM | POA: Diagnosis not present

## 2020-07-13 DIAGNOSIS — I130 Hypertensive heart and chronic kidney disease with heart failure and stage 1 through stage 4 chronic kidney disease, or unspecified chronic kidney disease: Secondary | ICD-10-CM | POA: Diagnosis not present

## 2020-07-13 DIAGNOSIS — N183 Chronic kidney disease, stage 3 unspecified: Secondary | ICD-10-CM | POA: Diagnosis not present

## 2020-07-13 DIAGNOSIS — R531 Weakness: Secondary | ICD-10-CM | POA: Diagnosis not present

## 2020-07-13 DIAGNOSIS — Z7902 Long term (current) use of antithrombotics/antiplatelets: Secondary | ICD-10-CM | POA: Diagnosis not present

## 2020-07-13 DIAGNOSIS — Z955 Presence of coronary angioplasty implant and graft: Secondary | ICD-10-CM | POA: Diagnosis not present

## 2020-07-13 DIAGNOSIS — Z7984 Long term (current) use of oral hypoglycemic drugs: Secondary | ICD-10-CM | POA: Insufficient documentation

## 2020-07-13 DIAGNOSIS — R9431 Abnormal electrocardiogram [ECG] [EKG]: Secondary | ICD-10-CM | POA: Diagnosis not present

## 2020-07-13 LAB — URINALYSIS, COMPLETE (UACMP) WITH MICROSCOPIC
Bacteria, UA: NONE SEEN
Bilirubin Urine: NEGATIVE
Glucose, UA: NEGATIVE mg/dL
Ketones, ur: NEGATIVE mg/dL
Leukocytes,Ua: NEGATIVE
Nitrite: NEGATIVE
Protein, ur: NEGATIVE mg/dL
Specific Gravity, Urine: 1.012 (ref 1.005–1.030)
Squamous Epithelial / HPF: NONE SEEN (ref 0–5)
pH: 5 (ref 5.0–8.0)

## 2020-07-13 LAB — CBC WITH DIFFERENTIAL/PLATELET
Abs Immature Granulocytes: 0.04 10*3/uL (ref 0.00–0.07)
Basophils Absolute: 0.1 10*3/uL (ref 0.0–0.1)
Basophils Relative: 1 %
Eosinophils Absolute: 0.4 10*3/uL (ref 0.0–0.5)
Eosinophils Relative: 4 %
HCT: 31.5 % — ABNORMAL LOW (ref 39.0–52.0)
Hemoglobin: 10.5 g/dL — ABNORMAL LOW (ref 13.0–17.0)
Immature Granulocytes: 0 %
Lymphocytes Relative: 12 %
Lymphs Abs: 1.1 10*3/uL (ref 0.7–4.0)
MCH: 31.7 pg (ref 26.0–34.0)
MCHC: 33.3 g/dL (ref 30.0–36.0)
MCV: 95.2 fL (ref 80.0–100.0)
Monocytes Absolute: 0.8 10*3/uL (ref 0.1–1.0)
Monocytes Relative: 8 %
Neutro Abs: 7.1 10*3/uL (ref 1.7–7.7)
Neutrophils Relative %: 75 %
Platelets: 276 10*3/uL (ref 150–400)
RBC: 3.31 MIL/uL — ABNORMAL LOW (ref 4.22–5.81)
RDW: 13.2 % (ref 11.5–15.5)
WBC: 9.5 10*3/uL (ref 4.0–10.5)
nRBC: 0 % (ref 0.0–0.2)

## 2020-07-13 LAB — COMPREHENSIVE METABOLIC PANEL
ALT: 19 U/L (ref 0–44)
AST: 31 U/L (ref 15–41)
Albumin: 3.6 g/dL (ref 3.5–5.0)
Alkaline Phosphatase: 86 U/L (ref 38–126)
Anion gap: 12 (ref 5–15)
BUN: 28 mg/dL — ABNORMAL HIGH (ref 8–23)
CO2: 26 mmol/L (ref 22–32)
Calcium: 9.5 mg/dL (ref 8.9–10.3)
Chloride: 100 mmol/L (ref 98–111)
Creatinine, Ser: 2.11 mg/dL — ABNORMAL HIGH (ref 0.61–1.24)
GFR, Estimated: 31 mL/min — ABNORMAL LOW (ref >60)
Glucose, Bld: 136 mg/dL — ABNORMAL HIGH (ref 70–99)
Potassium: 3.9 mmol/L (ref 3.5–5.1)
Sodium: 138 mmol/L (ref 135–145)
Total Bilirubin: 1 mg/dL (ref 0.3–1.2)
Total Protein: 7.7 g/dL (ref 6.5–8.1)

## 2020-07-13 LAB — LACTIC ACID, PLASMA
Lactic Acid, Venous: 2.3 mmol/L (ref 0.5–1.9)
Lactic Acid, Venous: 2.2 mmol/L (ref 0.5–1.9)

## 2020-07-13 LAB — PROCALCITONIN: Procalcitonin: 0.1 ng/mL

## 2020-07-13 MED ORDER — ONDANSETRON 4 MG PO TBDP: 4.0000 mg | ORAL_TABLET | Freq: Three times a day (TID) | ORAL | 0 refills | Status: DC | PRN

## 2020-07-13 MED ORDER — LACTATED RINGERS IV BOLUS
1000.0000 mL | Freq: Once | INTRAVENOUS | Status: AC
  Administered 2020-07-13: 1000 mL via INTRAVENOUS

## 2020-07-13 MED ORDER — NIRMATRELVIR/RITONAVIR (PAXLOVID) TABLET (RENAL DOSING): 2.0000 | ORAL_TABLET | Freq: Two times a day (BID) | ORAL | 0 refills | Status: AC

## 2020-07-13 NOTE — Discharge Instructions (Addendum)
For now, drink plenty of fluids.   HOLD your lasix dose for the next 24 hours, then resume  Drink at least 6-8 glasses of water daily  Do not take ibuprofen or other NSAIDs. Stick to tylenol as needed.  Take the Zofran as needed for nausea

## 2020-07-13 NOTE — ED Triage Notes (Addendum)
Pt referred from [facility] , pt dx with covid and had abnormal kidney function noted in his labs from yesterday. Pt is a/ox4 on arrival with a steady gait, in NAD.  Pt states he has not been feeling well for the past 5 weeks
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
## 2020-07-16 ENCOUNTER — Encounter: Payer: Self-pay | Admitting: Family Medicine

## 2020-07-16 ENCOUNTER — Telehealth (INDEPENDENT_AMBULATORY_CARE_PROVIDER_SITE_OTHER): Payer: Medicare Other | Admitting: Family Medicine

## 2020-07-16 VITALS — BP 144/60 | HR 57 | Wt 149.0 lb

## 2020-07-16 DIAGNOSIS — U071 COVID-19: Secondary | ICD-10-CM

## 2020-07-16 DIAGNOSIS — I5020 Unspecified systolic (congestive) heart failure: Secondary | ICD-10-CM | POA: Diagnosis not present

## 2020-07-16 DIAGNOSIS — I5022 Chronic systolic (congestive) heart failure: Secondary | ICD-10-CM

## 2020-07-16 DIAGNOSIS — N1831 Chronic kidney disease, stage 3a: Secondary | ICD-10-CM

## 2020-07-16 NOTE — Patient Instructions (Signed)
It was great to see you!  Our plans for today:  - Continue paxlovid and doxycycline until course completed. - Take probiotics or eat yogurt for your diarrhea. - Remain quarantined until you are 10 days from symptom onset. - Keep an eye on your blood pressure as you are having diarrhea. If the top number is less than 100, hold the lasix and call the doctor.  Take care and seek immediate care sooner if you develop any concerns.   Dr. Ky Barban

## 2020-07-16 NOTE — Progress Notes (Signed)
Virtual Visit Note  I connected with Billy Franco on 07/16/20 at 11:40 AM EDT by phone and verified that I am speaking with the correct person using two identifiers.  Location: Patient: home Provider: [facility] Wife also on call.   I discussed the limitations of evaluation and management by telemedicine and the availability of in person appointments. The patient expressed understanding and agreed to proceed.  History of Present Illness:  COVID INFECTION - COVID+ 6/23 at PCP visit, also with dehydration, mild AKI. Seen at [facility] ED 6/24, gave IVF and paxlovid. - symptom onset ~1 week ago. - fully vaccinated - taking doxycycline since 6/23, has 2 days left. - 4 doses of paxlovid left. - held lasix on Saturday after received IVF. Resumed on Sunday. BP 120s past few days.   Worst symptom: diarrhea Fever: no Cough: no Shortness of breath: no Wheezing: no Chest pain: no Chest tightness: no Chest congestion: no Nasal congestion: no Runny nose: no Sore throat: no Sinus pressure: no Headache: no Ear pain: no  Ear pressure: no  Vomiting: no Context: better Relief with OTC cold/cough medications: no     Observations/Objective:  Audio only, speaks in full sentences, no resp distress.  Assessment and Plan:  COVID-19 Improved, doing well with mild sx. Continues on doxycycline and paxlovid. Reviewed OTC symptom relief, self-quarantine guidelines, and emergency precautions. Recommend probiotics for diarrhea. F/u prn.    I discussed the assessment and treatment plan with the patient. The patient was provided an opportunity to ask questions and all were answered. The patient agreed with the plan and demonstrated an understanding of the instructions.   The patient was advised to call back or seek an in-person evaluation if the symptoms worsen or if the condition fails to improve as anticipated.  I provided 17 minutes of non-face-to-face time during this encounter.   Myles Gip,  DO

## 2020-07-18 LAB — CULTURE, BLOOD (SINGLE)
Culture: NO GROWTH
Special Requests: ADEQUATE

## 2020-08-06 DIAGNOSIS — N200 Calculus of kidney: Secondary | ICD-10-CM | POA: Diagnosis not present

## 2020-08-06 DIAGNOSIS — Z79899 Other long term (current) drug therapy: Secondary | ICD-10-CM | POA: Diagnosis not present

## 2020-08-06 DIAGNOSIS — N401 Enlarged prostate with lower urinary tract symptoms: Secondary | ICD-10-CM | POA: Diagnosis not present

## 2020-08-27 DIAGNOSIS — R319 Hematuria, unspecified: Secondary | ICD-10-CM | POA: Diagnosis not present

## 2020-08-27 DIAGNOSIS — I429 Cardiomyopathy, unspecified: Secondary | ICD-10-CM | POA: Diagnosis not present

## 2020-08-27 DIAGNOSIS — U071 COVID-19: Secondary | ICD-10-CM | POA: Diagnosis not present

## 2020-08-27 DIAGNOSIS — R001 Bradycardia, unspecified: Secondary | ICD-10-CM | POA: Diagnosis not present

## 2020-08-27 DIAGNOSIS — I700 Atherosclerosis of aorta: Secondary | ICD-10-CM | POA: Diagnosis not present

## 2020-08-27 DIAGNOSIS — I480 Paroxysmal atrial fibrillation: Secondary | ICD-10-CM | POA: Diagnosis not present

## 2020-08-27 DIAGNOSIS — R0609 Other forms of dyspnea: Secondary | ICD-10-CM | POA: Diagnosis not present

## 2020-08-27 DIAGNOSIS — E1122 Type 2 diabetes mellitus with diabetic chronic kidney disease: Secondary | ICD-10-CM | POA: Diagnosis not present

## 2020-08-27 DIAGNOSIS — Z955 Presence of coronary angioplasty implant and graft: Secondary | ICD-10-CM | POA: Diagnosis not present

## 2020-08-27 DIAGNOSIS — E785 Hyperlipidemia, unspecified: Secondary | ICD-10-CM | POA: Diagnosis not present

## 2020-08-27 DIAGNOSIS — I5020 Unspecified systolic (congestive) heart failure: Secondary | ICD-10-CM | POA: Diagnosis not present

## 2020-08-27 DIAGNOSIS — I10 Essential (primary) hypertension: Secondary | ICD-10-CM | POA: Diagnosis not present

## 2020-08-29 ENCOUNTER — Other Ambulatory Visit: Payer: Self-pay | Admitting: Family Medicine

## 2020-08-29 DIAGNOSIS — K219 Gastro-esophageal reflux disease without esophagitis: Secondary | ICD-10-CM

## 2020-08-29 NOTE — Telephone Encounter (Signed)
Requested Prescriptions  Pending Prescriptions Disp Refills  . omeprazole (PRILOSEC) 20 MG capsule [Pharmacy Med Name: OMEPRAZOLE DR 20 MG CAPSULE] 45 capsule 0    Sig: TAKE 1 CAPSULE (20 MG TOTAL) BY MOUTH EVERY OTHER DAY.     Gastroenterology: Proton Pump Inhibitors Passed - 08/29/2020  4:34 PM      Passed - Valid encounter within last 12 months    Recent Outpatient Visits          1 month ago [place], DO   4 months ago Hypothyroidism, acquired   [facility] Steele Sizer, MD   7 months ago Senile purpura [facility])   [facility] [place], Drue Stager, MD   8 months ago Atrial fibrillation with rapid ventricular response [facility])   [facility] [place] Conners D, MD   11 months ago Controlled type 2 diabetes mellitus without complication, without long-term current use of insulin [facility])   [facility] Steele Sizer, MD      Future Appointments            In 1 week Steele Sizer, MD [facility] [place]

## 2020-09-03 DIAGNOSIS — D0461 Carcinoma in situ of skin of right upper limb, including shoulder: Secondary | ICD-10-CM | POA: Diagnosis not present
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
## 2020-09-10 ENCOUNTER — Ambulatory Visit (INDEPENDENT_AMBULATORY_CARE_PROVIDER_SITE_OTHER): Payer: Medicare Other | Admitting: Family Medicine

## 2020-09-10 ENCOUNTER — Encounter: Payer: Self-pay | Admitting: Family Medicine

## 2020-09-10 ENCOUNTER — Other Ambulatory Visit: Payer: Self-pay

## 2020-09-10 VITALS — BP 110/68 | HR 54 | Temp 97.6°F | Resp 16 | Ht 67.0 in | Wt 146.8 lb

## 2020-09-10 DIAGNOSIS — I700 Atherosclerosis of aorta: Secondary | ICD-10-CM | POA: Diagnosis not present

## 2020-09-10 DIAGNOSIS — E1169 Type 2 diabetes mellitus with other specified complication: Secondary | ICD-10-CM | POA: Diagnosis not present

## 2020-09-10 DIAGNOSIS — E1122 Type 2 diabetes mellitus with diabetic chronic kidney disease: Secondary | ICD-10-CM

## 2020-09-10 DIAGNOSIS — I5020 Unspecified systolic (congestive) heart failure: Secondary | ICD-10-CM

## 2020-09-10 DIAGNOSIS — E785 Hyperlipidemia, unspecified: Secondary | ICD-10-CM | POA: Diagnosis not present

## 2020-09-10 DIAGNOSIS — M47816 Spondylosis without myelopathy or radiculopathy, lumbar region: Secondary | ICD-10-CM | POA: Diagnosis not present

## 2020-09-10 DIAGNOSIS — I5022 Chronic systolic (congestive) heart failure: Secondary | ICD-10-CM

## 2020-09-10 DIAGNOSIS — E039 Hypothyroidism, unspecified: Secondary | ICD-10-CM | POA: Diagnosis not present

## 2020-09-10 DIAGNOSIS — I4891 Unspecified atrial fibrillation: Secondary | ICD-10-CM

## 2020-09-10 DIAGNOSIS — E441 Mild protein-calorie malnutrition: Secondary | ICD-10-CM

## 2020-09-10 DIAGNOSIS — D649 Anemia, unspecified: Secondary | ICD-10-CM

## 2020-09-10 DIAGNOSIS — Z8739 Personal history of other diseases of the musculoskeletal system and connective tissue: Secondary | ICD-10-CM

## 2020-09-10 DIAGNOSIS — N200 Calculus of kidney: Secondary | ICD-10-CM

## 2020-09-10 DIAGNOSIS — I129 Hypertensive chronic kidney disease with stage 1 through stage 4 chronic kidney disease, or unspecified chronic kidney disease: Secondary | ICD-10-CM

## 2020-09-10 DIAGNOSIS — D692 Other nonthrombocytopenic purpura: Secondary | ICD-10-CM

## 2020-09-10 DIAGNOSIS — N1831 Chronic kidney disease, stage 3a: Secondary | ICD-10-CM

## 2020-09-10 DIAGNOSIS — R42 Dizziness and giddiness: Secondary | ICD-10-CM

## 2020-09-11 ENCOUNTER — Other Ambulatory Visit: Payer: Self-pay | Admitting: Family Medicine

## 2020-09-11 DIAGNOSIS — I129 Hypertensive chronic kidney disease with stage 1 through stage 4 chronic kidney disease, or unspecified chronic kidney disease: Secondary | ICD-10-CM

## 2020-09-11 DIAGNOSIS — I700 Atherosclerosis of aorta: Secondary | ICD-10-CM

## 2020-09-11 DIAGNOSIS — E039 Hypothyroidism, unspecified: Secondary | ICD-10-CM

## 2020-09-11 DIAGNOSIS — N1831 Chronic kidney disease, stage 3a: Secondary | ICD-10-CM

## 2020-09-11 DIAGNOSIS — E1169 Type 2 diabetes mellitus with other specified complication: Secondary | ICD-10-CM

## 2020-09-11 LAB — HEMOGLOBIN A1C
Hgb A1c MFr Bld: 6.4 % of total Hgb — ABNORMAL HIGH (ref <5.7)
Mean Plasma Glucose: 137 mg/dL
eAG (mmol/L): 7.6 mmol/L

## 2020-09-11 LAB — CBC WITH DIFFERENTIAL/PLATELET
Absolute Monocytes: 611 cells/uL (ref 200–950)
Basophils Absolute: 50 cells/uL (ref 0–200)
Basophils Relative: 0.7 %
Eosinophils Absolute: 348 cells/uL (ref 15–500)
Eosinophils Relative: 4.9 %
HCT: 35.8 % — ABNORMAL LOW (ref 38.5–50.0)
Hemoglobin: 11.8 g/dL — ABNORMAL LOW (ref 13.2–17.1)
Lymphs Abs: 1314 cells/uL (ref 850–3900)
MCH: 32.2 pg (ref 27.0–33.0)
MCHC: 33 g/dL (ref 32.0–36.0)
MCV: 97.5 fL (ref 80.0–100.0)
MPV: 9.8 fL (ref 7.5–12.5)
Monocytes Relative: 8.6 %
Neutro Abs: 4778 cells/uL (ref 1500–7800)
Neutrophils Relative %: 67.3 %
Platelets: 224 10*3/uL (ref 140–400)
RBC: 3.67 10*6/uL — ABNORMAL LOW (ref 4.20–5.80)
RDW: 13.6 % (ref 11.0–15.0)
Total Lymphocyte: 18.5 %
WBC: 7.1 10*3/uL (ref 3.8–10.8)

## 2020-09-11 LAB — LIPID PANEL
Cholesterol: 125 mg/dL (ref <200)
HDL: 54 mg/dL (ref >=40)
LDL Cholesterol (Calc): 54 mg/dL (calc)
Non-HDL Cholesterol (Calc): 71 mg/dL (calc) (ref <130)
Total CHOL/HDL Ratio: 2.3 (calc) (ref <5.0)
Triglycerides: 83 mg/dL (ref <150)

## 2020-09-11 LAB — COMPLETE METABOLIC PANEL WITH GFR
AG Ratio: 1.5 (calc) (ref 1.0–2.5)
ALT: 18 U/L (ref 9–46)
AST: 21 U/L (ref 10–35)
Albumin: 4.5 g/dL (ref 3.6–5.1)
Alkaline phosphatase (APISO): 66 U/L (ref 35–144)
BUN: 22 mg/dL (ref 7–25)
CO2: 30 mmol/L (ref 20–32)
Calcium: 10.2 mg/dL (ref 8.6–10.3)
Chloride: 102 mmol/L (ref 98–110)
Creat: 1.2 mg/dL (ref 0.70–1.28)
Globulin: 3 g/dL (calc) (ref 1.9–3.7)
Glucose, Bld: 151 mg/dL — ABNORMAL HIGH (ref 65–99)
Potassium: 4.4 mmol/L (ref 3.5–5.3)
Sodium: 139 mmol/L (ref 135–146)
Total Bilirubin: 0.7 mg/dL (ref 0.2–1.2)
Total Protein: 7.5 g/dL (ref 6.1–8.1)
eGFR: 62 mL/min/{1.73_m2} (ref >=60)

## 2020-09-11 LAB — IRON,TIBC AND FERRITIN PANEL
%SAT: 20 % (calc) (ref 20–48)
Ferritin: 168 ng/mL (ref 24–380)
Iron: 75 ug/dL (ref 50–180)
TIBC: 367 mcg/dL (calc) (ref 250–425)

## 2020-09-11 LAB — VITAMIN D 25 HYDROXY (VIT D DEFICIENCY, FRACTURES): Vit D, 25-Hydroxy: 24 ng/mL — ABNORMAL LOW (ref 30–100)

## 2020-09-11 LAB — MICROALBUMIN / CREATININE URINE RATIO
Creatinine, Urine: 86 mg/dL (ref 20–320)
Microalb Creat Ratio: 13 mcg/mg creat (ref <30)
Microalb, Ur: 1.1 mg/dL

## 2020-09-11 LAB — TSH: TSH: 3.05 mIU/L (ref 0.40–4.50)

## 2020-09-11 MED ORDER — METFORMIN HCL ER 500 MG PO TB24: 1000.0000 mg | ORAL_TABLET | Freq: Every day | ORAL | 1 refills | Status: DC

## 2020-09-11 MED ORDER — ATORVASTATIN CALCIUM 10 MG PO TABS: 10.0000 mg | ORAL_TABLET | Freq: Every day | ORAL | 1 refills | Status: DC

## 2020-09-11 MED ORDER — LEVOTHYROXINE SODIUM 25 MCG PO TABS: 25.0000 ug | ORAL_TABLET | Freq: Every day | ORAL | 1 refills | Status: DC

## 2020-09-17 ENCOUNTER — Telehealth: Payer: Self-pay

## 2020-09-17 NOTE — Telephone Encounter (Signed)
Copied from [place] [id number]. Topic: General - Other >> Sep 17, 2020  2:51 PM Valere Dross wrote: Reason for CRM: Pts wife called in stating they were waiting to get his lab results,a see what needs to be done. Please advise.

## 2020-09-18 ENCOUNTER — Telehealth: Payer: Self-pay

## 2020-09-18 NOTE — Telephone Encounter (Signed)
Spoke with patient's wife and relayed lab results per Dr Ancil Boozer. She stated patient declines ENT referral at this time. She was pleasant and verbalized understanding. She had no questions or concerns at this time but was advised to give [place] a call back should any arise.

## 2020-09-19 NOTE — Telephone Encounter (Signed)
Wife updated. She was pleasant and had no further questions at this time.

## 2020-11-13 ENCOUNTER — Ambulatory Visit (INDEPENDENT_AMBULATORY_CARE_PROVIDER_SITE_OTHER): Payer: Medicare Other | Admitting: Internal Medicine

## 2020-11-13 ENCOUNTER — Other Ambulatory Visit: Payer: Self-pay

## 2020-11-13 ENCOUNTER — Encounter: Payer: Self-pay | Admitting: Internal Medicine

## 2020-11-13 ENCOUNTER — Ambulatory Visit: Payer: Self-pay | Admitting: *Deleted

## 2020-11-13 VITALS — BP 126/68 | HR 88 | Temp 98.0°F | Resp 18 | Ht 67.0 in | Wt 154.7 lb

## 2020-11-13 DIAGNOSIS — Z23 Encounter for immunization: Secondary | ICD-10-CM

## 2020-11-13 DIAGNOSIS — W540XXA Bitten by dog, initial encounter: Secondary | ICD-10-CM

## 2020-11-13 DIAGNOSIS — S81811A Laceration without foreign body, right lower leg, initial encounter: Secondary | ICD-10-CM | POA: Diagnosis not present

## 2020-11-13 MED ORDER — AMOXICILLIN-POT CLAVULANATE 875-125 MG PO TABS: 1.0000 | ORAL_TABLET | Freq: Two times a day (BID) | ORAL | 0 refills | Status: AC

## 2020-11-13 MED ORDER — AMOXICILLIN-POT CLAVULANATE 875-125 MG PO TABS: 1.0000 | ORAL_TABLET | Freq: Two times a day (BID) | ORAL | 0 refills | Status: DC

## 2020-11-13 NOTE — Telephone Encounter (Signed)
Reason for Disposition  [1] Last tetanus shot > 5 years ago AND [2] any wound (e.g., cut, scrape)  Answer Assessment - Initial Assessment Questions 1. ANIMAL: "What type of animal caused the bite?" "Is the injury from a bite or a claw?" If the animal is a dog or a cat, ask: "Was it a pet or a stray?" "Was it acting ill or behaving strangely?"     Wife is not with patient now . Wife reports patient bit by a dog 2. LOCATION: "Where is the bite located?"      unknown 3. SIZE: "How big is the bite?" "What does it look like?"      na 4. ONSET: "When did the bite happen?" (Minutes or hours ago)      30 minutes ago  5. CIRCUMSTANCES: "Tell me how this happened."      Patient was mowing and got dog bit 6. TETANUS: "When was the last tetanus booster?"     ? 2015 Tdap 7. PREGNANCY: "Is there any chance you are pregnant?" "When was your last menstrual period?"     na  Protocols used: Animal Bite-A-AH

## 2020-11-13 NOTE — Patient Instructions (Signed)
It was great seeing you today!  Plan discussed at today's visit: -Keep area covered for 24 hours, ok to shower tomorrow but do not submerge the wound -Can use Neosporin or topical antibiotic on wound -Take oral antibiotics twice a day for 5 days  -We gave Tdap vaccine today -Find out who owns the dog and make sure it is up to date with vaccines or can be watched for 10 days and is not displaying signs of rabies. If not, do not need rabies vaccine but if you can't find the owner or the dog is displaying symptoms, go to health department or ER immediately to start rabies series.    Animal Bite, Adult Animal bite wounds can be mild or serious. It is important to get medical treatment to prevent infection. Ask your doctor if you need treatment to prevent an infection that can spread from animals to humans (rabies). Follow these instructions at home: Wound care Follow instructions from your doctor about how to take care of your wound. Make sure you: Wash your hands with soap and water before you change your bandage (dressing). If you cannot use soap and water, use hand sanitizer. Change your bandage as told by your doctor. Leave stitches (sutures), skin glue, or skin tape (adhesive) strips in place. They may need to stay in place for 2 weeks or longer. If tape strips get loose and curl up, you may trim the loose edges. Do not remove tape strips completely unless your doctor says it is okay. Check your wound every day for signs of infection. Check for: More redness, swelling, or pain. More fluid or blood. Warmth. Pus or a bad smell. Medicines Take or apply over-the-counter and prescription medicines only as told by your doctor. If you were prescribed an antibiotic, take or apply it as told by your doctor. Do not stop using the antibiotic even if your wound gets better. General instructions Keep the injured area raised (elevated) above the level of your heart while you are sitting or lying  down. If directed, put ice on the injured area. Put ice in a plastic bag. Place a towel between your skin and the bag. Leave the ice on for 20 minutes, 2-3 times per day. Keep all follow-up visits as told by your doctor. This is important. Contact a doctor if: You have more redness, swelling, or pain around your wound. Your wound feels warm to the touch. You have a fever or chills. You have a general feeling of sickness (malaise). You feel sick to your stomach (nauseous). You throw up (vomit). You have pain that does not get better. Get help right away if: You have a red streak going away from your wound. You have any of these coming from your wound: Non-clear fluid. More blood. Pus or a bad smell. You have trouble moving your injured area. You lose feeling (have numbness) or feel tingling anywhere on your body. Summary It is important to get the right medical treatment for animal bites. Treatment can help you to not get an infection. Ask your doctor if you need treatment to prevent an infection that can spread from animals to humans (rabies). Check your wound every day for signs of infection, such as more redness or swelling instead of less. If you have a red streak going away from your wound, get medical help right away. This information is not intended to replace advice given to you by your health care provider. Make sure you discuss any questions you have  with your health care provider. Document Revised: 11/01/2019 Document Reviewed: 11/01/2019 Elsevier Patient Education  2022 [place].    Follow up in: 1 week  Take care and let us know if you have any questions or concerns prior to your next visit.  Dr. Rosana Berger

## 2020-11-13 NOTE — Progress Notes (Signed)
Established Patient Office Visit  Subjective:  Patient ID: Billy Franco, male    DOB: [birth year]  Age: 80 y.o. MRN: [id number]  CC:  Chief Complaint  Patient presents with   Animal Bite    Right Leg    HPI Billy Franco presents for dog bite. Chronic medical conditions include HTN, DM2, A.fib, HFrEF.  He was mowing grass and a dog bit his posterior aspect of right leg, happened 2 hours ago.  The dog was wearing a collar, however the patient does not know the owner of the dog.  Apparently this dog that a young child in the neighborhood recently as well.  The patient states the wound bled heavily initially, he is on Plavix.  The bleeding has since stopped with pressure.  The bite is 5 out of 10 painful.  The patient denies any systemic symptoms, no fevers chills, dizziness, headache, change in vision, GI symptoms.  The patient was able to drive himself to this appointment and is ambulating without pain.  His last tetanus shot was in 2015.  The patient reports no other questions or concerns today.  Past Medical History:  Diagnosis Date   Arthritis    Diabetes mellitus without complication (HCC)    GERD (gastroesophageal reflux disease)    Nephrolithiasis     Past Surgical History:  Procedure Laterality Date   CORONARY STENT INTERVENTION N/A 03/08/2020   Procedure: CORONARY STENT INTERVENTION;  Surgeon: Yolonda Kida, MD;  Location: [facility];  Service: Cardiovascular;  Laterality: N/A;   LEFT HEART CATH AND CORONARY ANGIOGRAPHY Left 03/08/2020   Procedure: LEFT HEART CATH AND CORONARY ANGIOGRAPHY;  Surgeon: Yolonda Kida, MD;  Location: [facility];  Service: Cardiovascular;  Laterality: Left;   NASAL FRACTURE SURGERY  1994   URETEROLITHOTOMY Right 2015    Family History  Problem Relation Age of Onset   Heart disease Father     Social History   Socioeconomic History   Marital status: Married    Spouse name: Publishing copy    Number of children: 3    Years of education: Not on file   Highest education level: Not on file  Occupational History   Occupation: retired     Comment: Radiographer, therapeutic at a [employer]   Tobacco Use   Smoking status: Never   Smokeless tobacco: Never  Vaping Use   Vaping Use: Never used  Substance and Sexual Activity   Alcohol use: No    Alcohol/week: 0.0 standard drinks   Drug use: No   Sexual activity: Not Currently  Other Topics Concern   Not on file  Social History Narrative   Married, had 3 children, one died at a MVA at age 32   Pastor of a church    Social Determinants of Radio broadcast assistant Strain: Not on file  Food Insecurity: Not on file  Transportation Needs: Not on file  Physical Activity: Not on file  Stress: Not on file  Social Connections: Not on file  Intimate Partner Violence: Not on file    Outpatient Medications Prior to Visit  Medication Sig Dispense Refill   acetaminophen (TYLENOL) 650 MG CR tablet Take 650 mg by mouth every 8 (eight) hours as needed for pain.     atorvastatin (LIPITOR) 10 MG tablet Take 1 tablet (10 mg total) by mouth daily at 6 PM. 90 tablet 1   carvedilol (COREG) 3.125 MG tablet Take 1 tablet (3.125 mg total) by mouth 2 (  two) times daily with a meal. 60 tablet 0   clopidogrel (PLAVIX) 75 MG tablet Take 1 tablet (75 mg total) by mouth daily with breakfast for 30 doses. 30 tablet 6   Cyanocobalamin (B-12) 1000 MCG TBCR Take 1,000 mcg by mouth daily.     docusate sodium (COLACE) 50 MG capsule Take 100 mg by mouth 2 (two) times daily as needed for mild constipation.     fexofenadine-pseudoephedrine (ALLEGRA-D 24) 180-240 MG 24 hr tablet Take 1 tablet by mouth daily as needed (allergies).     furosemide (LASIX) 20 MG tablet Take 20 mg by mouth daily.     glucose blood (ONETOUCH ULTRA) test strip USE AS DIRECTED TO CHECK BG ONCE DAILY. 100 strip 3   levothyroxine (SYNTHROID) 25 MCG tablet Take 1 tablet (25 mcg total) by mouth daily before breakfast. 96  tablet 1   losartan (COZAAR) 25 MG tablet Take 1 tablet (25 mg total) by mouth at bedtime. 30 tablet 0   metFORMIN (GLUCOPHAGE XR) 500 MG 24 hr tablet Take 2 tablets (1,000 mg total) by mouth daily with breakfast. 180 tablet 1   Nutritional Supplements (THERALITH XR PO) Take 2 tablets by mouth at bedtime.     omeprazole (PRILOSEC) 20 MG capsule TAKE 1 CAPSULE (20 MG TOTAL) BY MOUTH EVERY OTHER DAY. 45 capsule 0   tamsulosin (FLOMAX) 0.4 MG CAPS capsule Take 0.4 mg by mouth every evening.     triamcinolone (KENALOG) 0.1 % Apply 1 application topically 2 (two) times daily as needed (rash). (Patient not taking: Reported on 09/10/2020)     No facility-administered medications prior to visit.    Allergies  Allergen Reactions   Codeine Rash and Other (See Comments)    Per pt "hard on my kidneys"     Lovastatin Rash and Other (See Comments)    ROS Review of Systems  Constitutional:  Negative for chills and fever.  Eyes:  Negative for visual disturbance.  Gastrointestinal:  Negative for nausea and vomiting.  Musculoskeletal:  Negative for arthralgias, gait problem and myalgias.  Skin:  Positive for wound. Negative for rash.  Neurological:  Negative for dizziness and weakness.     Objective:    Physical Exam Constitutional:      Appearance: Normal appearance.  HENT:     Head: Normocephalic and atraumatic.  Eyes:     Conjunctiva/sclera: Conjunctivae normal.  Cardiovascular:     Rate and Rhythm: Normal rate and regular rhythm.  Pulmonary:     Effort: Pulmonary effort is normal.     Breath sounds: Normal breath sounds.  Musculoskeletal:     Right lower leg: No edema.     Left lower leg: No edema.  Skin:    General: Skin is warm.     Comments: About 1 inch long smooth laceration with surrounding ecchymosis on posterior/lateral aspect of right leg without drainage or local erythema.  Neurological:     Mental Status: He is alert.    There were no vitals taken for this visit. Wt  Readings from Last 3 Encounters:  09/10/20 146 lb 12.8 oz (66.6 kg)  07/16/20 149 lb (67.6 kg)  07/13/20 149 lb (67.6 kg)     Health Maintenance Due  Topic Date Due   Zoster Vaccines- Shingrix (1 of 2) Never done   COVID-19 Vaccine (4 - Booster for Moderna series) 04/13/2020   INFLUENZA VACCINE  Never done    There are no preventive care reminders to display for this patient.  Lab Results  Component Value Date   TSH 3.05 09/10/2020   Lab Results  Component Value Date   WBC 7.1 09/10/2020   HGB 11.8 (L) 09/10/2020   HCT 35.8 (L) 09/10/2020   MCV 97.5 09/10/2020   PLT 224 09/10/2020   Lab Results  Component Value Date   NA 139 09/10/2020   K 4.4 09/10/2020   CO2 30 09/10/2020   GLUCOSE 151 (H) 09/10/2020   BUN 22 09/10/2020   CREATININE 1.20 09/10/2020   BILITOT 0.7 09/10/2020   ALKPHOS 86 07/13/2020   AST 21 09/10/2020   ALT 18 09/10/2020   PROT 7.5 09/10/2020   ALBUMIN 3.6 07/13/2020   CALCIUM 10.2 09/10/2020   ANIONGAP 12 07/13/2020   EGFR 62 09/10/2020   Lab Results  Component Value Date   CHOL 125 09/10/2020   Lab Results  Component Value Date   HDL 54 09/10/2020   Lab Results  Component Value Date   LDLCALC 54 09/10/2020   Lab Results  Component Value Date   TRIG 83 09/10/2020   Lab Results  Component Value Date   CHOLHDL 2.3 09/10/2020   Lab Results  Component Value Date   HGBA1C 6.4 (H) 09/10/2020      Assessment & Plan:   1. Dog bite, initial encounter: As we do not currently have local anesthetic in this office, the wound was cleaned with alcohol and iodine, dressed with triple antibiotic ointment and approximated with a Steri-Strip.  The wound was then covered in a nonadhesive bandage and wrapped.  Patient was given instructions to leave the wound wrapped for 24 hours, tomorrow he can change the bandage and use topical antibiotic ointment.  He was given a Tdap booster today and he will be started on oral Augmentin x5 days.  He was  instructed to attempt to find the owner of the dog, if this cannot be completed in 24 hours or if the dog is unvaccinated or displaying symptoms of rabies, the patient was advised to present to the emergency room or health department to begin rabies vaccination series immediately.  He voices understanding and agreement.  He will follow-up in the office in 1 week for wound check.  - Tdap vaccine greater than or equal to 7yo IM - amoxicillin-clavulanate (AUGMENTIN) 875-125 MG tablet; Take 1 tablet by mouth 2 (two) times daily.  Dispense: 20 tablet; Refill: 0   Follow-up: Return in about 1 week (around 11/20/2020) for wound check.    Teodora Medici, DO

## 2020-11-13 NOTE — Telephone Encounter (Signed)
Patient's wife called to get appt with PCP due to patient was mowing and got dog bit. Wife not with patient and unable to give specific information other than patient was bit greater then 30 minutes ago and someone helped patient with a bandage. Instructed patient's wife to call back when patient available . Contacted clinic and appt scheduled for today at 1:00. Care advise given . Patient's wife verbalized understanding of care advise and to call back or go to [facility] or ED if symptoms worsen.

## 2020-11-16 NOTE — Progress Notes (Signed)
Acute Office Visit  Subjective:    Patient ID: Billy Franco, male    DOB: Apr 14, 1940, 80 y.o.   MRN: [id number]  Chief Complaint  Patient presents with   Follow-up    1 week for animal bite    HPI Patient is in today for follow up on dog bite. Last week he was mowing grass when a dog bit the posterior aspect of his right leg. The wound was cleaned and approximated with one steri-strip. He was given a Tdap booster and was started on Augmentin for 5 days. He was able to contact the owner of the dog and find out that the dog is fully vaccinated. Today he states that the wound is improving. He's been cleaning with alcohol and Neosporin and keeping it wrapped. The steri-strip fell off after about 4-5 days. He did complete is antibiotic 2 days ago. He did develop some diarrhea with that, which did resolve after finishing the abx. He denies pain with wound, difficultly ambulating, drainage, redness or fevers. Overall he is doing well and has no other questions or concerns today.   Past Medical History:  Diagnosis Date   Arthritis    Diabetes mellitus without complication (HCC)    GERD (gastroesophageal reflux disease)    Nephrolithiasis     Past Surgical History:  Procedure Laterality Date   CORONARY STENT INTERVENTION N/A 03/08/2020   Procedure: CORONARY STENT INTERVENTION;  Surgeon: Yolonda Kida, MD;  Location: [facility];  Service: Cardiovascular;  Laterality: N/A;   LEFT HEART CATH AND CORONARY ANGIOGRAPHY Left 03/08/2020   Procedure: LEFT HEART CATH AND CORONARY ANGIOGRAPHY;  Surgeon: Yolonda Kida, MD;  Location: [facility];  Service: Cardiovascular;  Laterality: Left;   NASAL FRACTURE SURGERY  1994   URETEROLITHOTOMY Right 2015    Family History  Problem Relation Age of Onset   Heart disease Father     Social History   Socioeconomic History   Marital status: Married    Spouse name: Publishing copy    Number of children: 3   Years of education: Not on  file   Highest education level: Not on file  Occupational History   Occupation: retired     Comment: Radiographer, therapeutic at a Animal [employer]   Tobacco Use   Smoking status: Never   Smokeless tobacco: Never  Vaping Use   Vaping Use: Never used  Substance and Sexual Activity   Alcohol use: No    Alcohol/week: 0.0 standard drinks   Drug use: No   Sexual activity: Not Currently  Other Topics Concern   Not on file  Social History Narrative   Married, had 3 children, one died at a MVA at age 85   Pastor of a church    Social Determinants of Radio broadcast assistant Strain: Not on file  Food Insecurity: Not on file  Transportation Needs: Not on file  Physical Activity: Not on file  Stress: Not on file  Social Connections: Not on file  Intimate Partner Violence: Not on file    Outpatient Medications Prior to Visit  Medication Sig Dispense Refill   acetaminophen (TYLENOL) 650 MG CR tablet Take 650 mg by mouth every 8 (eight) hours as needed for pain.     amoxicillin-clavulanate (AUGMENTIN) 875-125 MG tablet Take 1 tablet by mouth 2 (two) times daily for 5 days. 10 tablet 0   atorvastatin (LIPITOR) 10 MG tablet Take 1 tablet (10 mg total) by mouth daily at 6  PM. 90 tablet 1   carvedilol (COREG) 3.125 MG tablet Take 1 tablet (3.125 mg total) by mouth 2 (two) times daily with a meal. 60 tablet 0   clopidogrel (PLAVIX) 75 MG tablet Take 1 tablet (75 mg total) by mouth daily with breakfast for 30 doses. 30 tablet 6   Cyanocobalamin (B-12) 1000 MCG TBCR Take 1,000 mcg by mouth daily.     docusate sodium (COLACE) 50 MG capsule Take 100 mg by mouth 2 (two) times daily as needed for mild constipation.     fexofenadine-pseudoephedrine (ALLEGRA-D 24) 180-240 MG 24 hr tablet Take 1 tablet by mouth daily as needed (allergies).     furosemide (LASIX) 20 MG tablet Take 20 mg by mouth daily.     glucose blood (ONETOUCH ULTRA) test strip USE AS DIRECTED TO CHECK BG ONCE DAILY. 100 strip 3   levothyroxine  (SYNTHROID) 25 MCG tablet Take 1 tablet (25 mcg total) by mouth daily before breakfast. 96 tablet 1   losartan (COZAAR) 25 MG tablet Take 1 tablet (25 mg total) by mouth at bedtime. 30 tablet 0   metFORMIN (GLUCOPHAGE XR) 500 MG 24 hr tablet Take 2 tablets (1,000 mg total) by mouth daily with breakfast. 180 tablet 1   Nutritional Supplements (THERALITH XR PO) Take 2 tablets by mouth at bedtime.     omeprazole (PRILOSEC) 20 MG capsule TAKE 1 CAPSULE (20 MG TOTAL) BY MOUTH EVERY OTHER DAY. 45 capsule 0   tamsulosin (FLOMAX) 0.4 MG CAPS capsule Take 0.4 mg by mouth every evening.     triamcinolone (KENALOG) 0.1 % Apply 1 application topically 2 (two) times daily as needed (rash).     No facility-administered medications prior to visit.    Allergies  Allergen Reactions   Codeine Rash and Other (See Comments)    Per pt "hard on my kidneys"     Lovastatin Rash and Other (See Comments)    Review of Systems  Constitutional:  Negative for activity change, chills and fever.  Respiratory:  Negative for shortness of breath.   Cardiovascular:  Negative for chest pain.  Musculoskeletal: Negative.   Skin:  Positive for wound.      Objective:    Physical Exam Constitutional:      Appearance: Normal appearance.  HENT:     Head: Normocephalic and atraumatic.  Eyes:     Conjunctiva/sclera: Conjunctivae normal.  Cardiovascular:     Rate and Rhythm: Normal rate and regular rhythm.  Pulmonary:     Effort: Pulmonary effort is normal.     Breath sounds: Normal breath sounds.  Musculoskeletal:     Right lower leg: No edema.     Left lower leg: No edema.  Skin:    General: Skin is warm and dry.     Comments: Approximately 1 inch laceration with scabbing and surrounding hematoma but laceration is cleaning without erythema or drainage. No signs of infection, healing well.   Neurological:     Mental Status: He is alert.    BP 126/78   Pulse 96   Temp 98.7 F (37.1 C)   Resp 16   Ht 5' 7"   (1.702 m)   Wt 154 lb (69.9 kg)   SpO2 97%   BMI 24.12 kg/m  Wt Readings from Last 3 Encounters:  11/13/20 154 lb 11.2 oz (70.2 kg)  09/10/20 146 lb 12.8 oz (66.6 kg)  07/16/20 149 lb (67.6 kg)    Health Maintenance Due  Topic Date Due   Zoster  Vaccines- Shingrix (1 of 2) Never done   COVID-19 Vaccine (4 - Booster for Moderna series) 04/13/2020    There are no preventive care reminders to display for this patient.   Lab Results  Component Value Date   TSH 3.05 09/10/2020   Lab Results  Component Value Date   WBC 7.1 09/10/2020   HGB 11.8 (L) 09/10/2020   HCT 35.8 (L) 09/10/2020   MCV 97.5 09/10/2020   PLT 224 09/10/2020   Lab Results  Component Value Date   NA 139 09/10/2020   K 4.4 09/10/2020   CO2 30 09/10/2020   GLUCOSE 151 (H) 09/10/2020   BUN 22 09/10/2020   CREATININE 1.20 09/10/2020   BILITOT 0.7 09/10/2020   ALKPHOS 86 07/13/2020   AST 21 09/10/2020   ALT 18 09/10/2020   PROT 7.5 09/10/2020   ALBUMIN 3.6 07/13/2020   CALCIUM 10.2 09/10/2020   ANIONGAP 12 07/13/2020   EGFR 62 09/10/2020   Lab Results  Component Value Date   CHOL 125 09/10/2020   Lab Results  Component Value Date   HDL 54 09/10/2020   Lab Results  Component Value Date   LDLCALC 54 09/10/2020   Lab Results  Component Value Date   TRIG 83 09/10/2020   Lab Results  Component Value Date   CHOLHDL 2.3 09/10/2020   Lab Results  Component Value Date   HGBA1C 6.4 (H) 09/10/2020       Assessment & Plan:   1. Laceration of right lower extremity, subsequent encounter/Dog bite of left lower leg, subsequent encounter: Improving. Antibiotics finished, will continue to keep clean and use topical antibiotic ointment. If fever, erythema, drainage or pain begin, he will call the office immediately. He has a regularly scheduled follow up in 6 weeks, recheck at that time.   Teodora Medici, DO

## 2020-11-19 ENCOUNTER — Ambulatory Visit (INDEPENDENT_AMBULATORY_CARE_PROVIDER_SITE_OTHER): Payer: Medicare Other | Admitting: Internal Medicine

## 2020-11-19 ENCOUNTER — Encounter: Payer: Self-pay | Admitting: Internal Medicine

## 2020-11-19 ENCOUNTER — Other Ambulatory Visit: Payer: Self-pay

## 2020-11-19 VITALS — BP 126/78 | HR 96 | Temp 98.7°F | Resp 16 | Ht 67.0 in | Wt 154.0 lb

## 2020-11-19 DIAGNOSIS — S81852D Open bite, left lower leg, subsequent encounter: Secondary | ICD-10-CM

## 2020-11-19 DIAGNOSIS — S81811D Laceration without foreign body, right lower leg, subsequent encounter: Secondary | ICD-10-CM

## 2020-11-19 DIAGNOSIS — W540XXD Bitten by dog, subsequent encounter: Secondary | ICD-10-CM | POA: Diagnosis not present

## 2020-11-19 NOTE — Patient Instructions (Signed)
It was great seeing you today!  Plan discussed at today's visit: -Keep doing what you're doing, if the wound becomes more painful, red or has pus or if you have fevers, please let us know immediately.   Follow up in: 6 weeks  Take care and let us know if you have any questions or concerns prior to your next visit.  Dr. Rosana Berger

## 2020-11-20 ENCOUNTER — Ambulatory Visit: Payer: Medicare Other | Admitting: Internal Medicine

## 2020-11-21 ENCOUNTER — Telehealth: Payer: Self-pay

## 2020-11-21 NOTE — Telephone Encounter (Signed)
Patients wife called states last night there was pus on dog bite area and was told to notify us if this happened.  Pt had another rx for Augmentin and started it yesterday. Pt was told to come in for evaluation but denied.  Pt was told to continue antibiotic for another 4 days if got worse, developes a fever or leg because painful he would need to be seen.

## 2020-12-03 ENCOUNTER — Other Ambulatory Visit: Payer: Self-pay

## 2020-12-03 ENCOUNTER — Emergency Department
Admission: EM | Admit: 2020-12-03 | Discharge: 2020-12-03 | Disposition: A | Discharge status: Home / Self Care | Payer: Medicare Other | Attending: Emergency Medicine | Admitting: Emergency Medicine

## 2020-12-03 ENCOUNTER — Emergency Department: Payer: Medicare Other

## 2020-12-03 DIAGNOSIS — W540XXD Bitten by dog, subsequent encounter: Secondary | ICD-10-CM | POA: Insufficient documentation

## 2020-12-03 DIAGNOSIS — Z7902 Long term (current) use of antithrombotics/antiplatelets: Secondary | ICD-10-CM | POA: Diagnosis not present

## 2020-12-03 DIAGNOSIS — I130 Hypertensive heart and chronic kidney disease with heart failure and stage 1 through stage 4 chronic kidney disease, or unspecified chronic kidney disease: Secondary | ICD-10-CM | POA: Insufficient documentation

## 2020-12-03 DIAGNOSIS — I10 Essential (primary) hypertension: Secondary | ICD-10-CM | POA: Diagnosis not present

## 2020-12-03 DIAGNOSIS — I2510 Atherosclerotic heart disease of native coronary artery without angina pectoris: Secondary | ICD-10-CM | POA: Insufficient documentation

## 2020-12-03 DIAGNOSIS — E785 Hyperlipidemia, unspecified: Secondary | ICD-10-CM | POA: Diagnosis not present

## 2020-12-03 DIAGNOSIS — N1831 Chronic kidney disease, stage 3a: Secondary | ICD-10-CM | POA: Diagnosis not present

## 2020-12-03 DIAGNOSIS — Z79899 Other long term (current) drug therapy: Secondary | ICD-10-CM | POA: Diagnosis not present

## 2020-12-03 DIAGNOSIS — I700 Atherosclerosis of aorta: Secondary | ICD-10-CM | POA: Diagnosis not present

## 2020-12-03 DIAGNOSIS — I5022 Chronic systolic (congestive) heart failure: Secondary | ICD-10-CM | POA: Diagnosis not present

## 2020-12-03 DIAGNOSIS — S81851D Open bite, right lower leg, subsequent encounter: Secondary | ICD-10-CM | POA: Diagnosis not present

## 2020-12-03 DIAGNOSIS — E1169 Type 2 diabetes mellitus with other specified complication: Secondary | ICD-10-CM | POA: Diagnosis not present

## 2020-12-03 DIAGNOSIS — S81801A Unspecified open wound, right lower leg, initial encounter: Secondary | ICD-10-CM | POA: Diagnosis not present

## 2020-12-03 DIAGNOSIS — I429 Cardiomyopathy, unspecified: Secondary | ICD-10-CM | POA: Diagnosis not present

## 2020-12-03 DIAGNOSIS — R001 Bradycardia, unspecified: Secondary | ICD-10-CM | POA: Diagnosis not present

## 2020-12-03 DIAGNOSIS — Z7984 Long term (current) use of oral hypoglycemic drugs: Secondary | ICD-10-CM | POA: Diagnosis not present

## 2020-12-03 DIAGNOSIS — K219 Gastro-esophageal reflux disease without esophagitis: Secondary | ICD-10-CM | POA: Diagnosis not present

## 2020-12-03 DIAGNOSIS — E1122 Type 2 diabetes mellitus with diabetic chronic kidney disease: Secondary | ICD-10-CM | POA: Insufficient documentation

## 2020-12-03 DIAGNOSIS — I5020 Unspecified systolic (congestive) heart failure: Secondary | ICD-10-CM | POA: Diagnosis not present

## 2020-12-03 DIAGNOSIS — R0609 Other forms of dyspnea: Secondary | ICD-10-CM | POA: Diagnosis not present

## 2020-12-03 DIAGNOSIS — Z955 Presence of coronary angioplasty implant and graft: Secondary | ICD-10-CM | POA: Diagnosis not present

## 2020-12-03 DIAGNOSIS — I480 Paroxysmal atrial fibrillation: Secondary | ICD-10-CM | POA: Diagnosis not present

## 2020-12-03 MED ORDER — DOXYCYCLINE MONOHYDRATE 100 MG PO TABS: 100.0000 mg | ORAL_TABLET | Freq: Two times a day (BID) | ORAL | 0 refills | Status: DC

## 2020-12-03 NOTE — Discharge Instructions (Signed)
Wash the area with soap and water daily.  Do not apply Neosporin.  Do not use alcohol on the wound.  Do not use hydrogen peroxide on the wound. Follow-up with the wound care center to ensure the area heals Take the doxycycline as prescribed Return if worsening

## 2020-12-03 NOTE — ED Triage Notes (Signed)
Pt reports dog bite a few weeks ago and has been on 2 rounds of antibiotics, wife is concerned that it isn't healing the way it should and pt has a hx of diabetes

## 2020-12-03 NOTE — ED Triage Notes (Signed)
First Nurse Note:  C/O dog bite to right lower leg 3 weeks ago.  States wound not improving.  Has been seen through [facility] twice for same.

## 2020-12-03 NOTE — ED Notes (Signed)
See triage note  presents with dog bite to right lower leg  states wound has been there for about 3 weeks   area is red
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
## 2020-12-17 ENCOUNTER — Telehealth: Payer: Self-pay | Admitting: Family Medicine

## 2020-12-17 DIAGNOSIS — E039 Hypothyroidism, unspecified: Secondary | ICD-10-CM

## 2020-12-17 DIAGNOSIS — E1169 Type 2 diabetes mellitus with other specified complication: Secondary | ICD-10-CM

## 2020-12-17 DIAGNOSIS — I700 Atherosclerosis of aorta: Secondary | ICD-10-CM

## 2020-12-17 NOTE — Telephone Encounter (Unsigned)
Copied from [place] [phone]. Topic: Quick Communication - Rx Refill/Question >> Dec 17, 2020  9:22 AM Yvette Rack wrote: Medication: atorvastatin (LIPITOR) 10 MG tablet, glucose blood (ONETOUCH ULTRA) test strip, B-D ULTRA-FINE 33 LANCETS, and levothyroxine (SYNTHROID) 25 MCG tablet  Has the patient contacted their pharmacy? Yes.  Previous pharmacy closed and Rx was not forwarded to new pharmacy (Agent: If no, request that the patient contact the pharmacy for the refill. If patient does not wish to contact the pharmacy document the reason why and proceed with request.) (Agent: If yes, when and what did the pharmacy advise?)  Preferred Pharmacy (with phone number or street name): [id number] - [place], [place]  Phone: [phone]  Fax: [phone]  Has the patient been seen for an appointment in the last year OR does the patient have an upcoming appointment? Yes.    Agent: Please be advised that RX refills may take up to 3 business days. We ask that you follow-up with your pharmacy.

## 2020-12-24 ENCOUNTER — Encounter: Payer: Medicare Other | Attending: Physician Assistant | Admitting: Physician Assistant

## 2020-12-24 ENCOUNTER — Other Ambulatory Visit: Payer: Self-pay

## 2020-12-24 DIAGNOSIS — E1151 Type 2 diabetes mellitus with diabetic peripheral angiopathy without gangrene: Secondary | ICD-10-CM | POA: Diagnosis not present

## 2020-12-24 DIAGNOSIS — W540XXA Bitten by dog, initial encounter: Secondary | ICD-10-CM | POA: Insufficient documentation

## 2020-12-24 DIAGNOSIS — A499 Bacterial infection, unspecified: Secondary | ICD-10-CM | POA: Diagnosis not present

## 2020-12-24 DIAGNOSIS — I5042 Chronic combined systolic (congestive) and diastolic (congestive) heart failure: Secondary | ICD-10-CM | POA: Insufficient documentation

## 2020-12-24 DIAGNOSIS — E11622 Type 2 diabetes mellitus with other skin ulcer: Secondary | ICD-10-CM | POA: Insufficient documentation

## 2020-12-24 DIAGNOSIS — I110 Hypertensive heart disease with heart failure: Secondary | ICD-10-CM | POA: Insufficient documentation

## 2020-12-24 DIAGNOSIS — I10 Essential (primary) hypertension: Secondary | ICD-10-CM | POA: Diagnosis not present

## 2020-12-24 DIAGNOSIS — L97812 Non-pressure chronic ulcer of other part of right lower leg with fat layer exposed: Secondary | ICD-10-CM | POA: Insufficient documentation

## 2020-12-25 DIAGNOSIS — R1110 Vomiting, unspecified: Secondary | ICD-10-CM | POA: Diagnosis not present

## 2020-12-25 DIAGNOSIS — N401 Enlarged prostate with lower urinary tract symptoms: Secondary | ICD-10-CM | POA: Diagnosis not present

## 2020-12-25 DIAGNOSIS — R1032 Left lower quadrant pain: Secondary | ICD-10-CM | POA: Diagnosis not present

## 2020-12-25 DIAGNOSIS — N200 Calculus of kidney: Secondary | ICD-10-CM | POA: Diagnosis not present

## 2020-12-25 DIAGNOSIS — N23 Unspecified renal colic: Secondary | ICD-10-CM | POA: Diagnosis not present

## 2020-12-25 NOTE — Progress Notes (Signed)
Billy, Franco ([id number]) Visit Report for 12/24/2020 Allergy List Details Patient Name: Billy Franco, Billy Franco Date of Service: 12/24/2020 8:45 AM Medical Record Number: [id number] Patient Account Number: [id number] Date of Birth/Sex: [birth year] (80 y.o. M) Treating RN: Billy Franco Primary Care Billy Franco: Billy Franco Other Clinician: Referring Billy Franco: Billy Franco Treating Billy Franco/Extender: Billy Franco Weeks in Treatment: 0 Allergies Active Allergies codeine lovastatin Allergy Notes Electronic Signature(s) Signed: 12/25/2020 10:19:34 AM By: Billy Coria RN Entered By: Billy Franco on 12/24/2020 09:06:43 Billy Franco ([id number]) -------------------------------------------------------------------------------- Arrival Information Details Patient Name: Billy Franco Date of Service: 12/24/2020 8:45 AM Medical Record Number: [id number] Patient Account Number: [id number] Date of Birth/Sex: [birth year] (80 y.o. M) Treating RN: Billy Franco Primary Care Billy Franco: Billy Franco Other Clinician: Referring Billy Franco: Billy Franco Treating Billy Franco/Extender: Billy Franco in Treatment: 0 Visit Information Patient Arrived: Ambulatory Arrival Time: 08:58 Accompanied By: wife Transfer Assistance: None Patient Identification Verified: Yes Secondary Verification Process Completed: Yes Patient Requires Transmission-Based No Precautions: Patient Has Alerts: Yes Patient Alerts: Patient on Blood Thinner Electronic Signature(s) Signed: 12/25/2020 10:19:34 AM By: Billy Coria RN Entered By: Billy Franco on 12/24/2020 09:04:59 Billy Franco ([id number]) -------------------------------------------------------------------------------- Clinic Level of Care Assessment Details Patient Name: Billy Franco Date of Service: 12/24/2020 8:45 AM Medical Record Number: [id number] Patient Account Number: [id number] Date of Birth/Sex: 04/15/1940 (80 y.o. M) Treating RN: Billy Franco Primary  Care Billy Franco: Billy Franco Other Clinician: Referring Billy Franco: Billy Franco Treating Billy Franco/Extender: Billy Franco in Treatment: 0 Clinic Level of Care Assessment Items TOOL 1 Quantity Score X - Use when EandM and Procedure is performed on INITIAL visit 1 0 ASSESSMENTS - Nursing Assessment / Reassessment X - General Physical Exam (combine w/ comprehensive assessment (listed just below) when performed on new 1 20 pt. evals) X- 1 25 Comprehensive Assessment (HX, ROS, Risk Assessments, Wounds Hx, etc.) ASSESSMENTS - Wound and Skin Assessment / Reassessment []  - Dermatologic / Skin Assessment (not related to wound area) 0 ASSESSMENTS - Ostomy and/or Continence Assessment and Care []  - Incontinence Assessment and Management 0 []  - 0 Ostomy Care Assessment and Management (repouching, etc.) PROCESS - Coordination of Care X - Simple Patient / Family Education for ongoing care 1 15 []  - 0 Complex (extensive) Patient / Family Education for ongoing care X- 1 10 Staff obtains Programmer, systems, Records, Test Results / Process Orders []  - 0 Staff telephones HHA, Nursing Homes / Clarify orders / etc []  - 0 Routine Transfer to another Facility (non-emergent condition) []  - 0 Routine Hospital Admission (non-emergent condition) X- 1 15 New Admissions / Biomedical engineer / Ordering NPWT, Apligraf, etc. []  - 0 Emergency Hospital Admission (emergent condition) PROCESS - Special Needs []  - Pediatric / Minor Patient Management 0 []  - 0 Isolation Patient Management []  - 0 Hearing / Language / Visual special needs []  - 0 Assessment of Community assistance (transportation, D/C planning, etc.) []  - 0 Additional assistance / Altered mentation []  - 0 Support Surface(s) Assessment (bed, cushion, seat, etc.) INTERVENTIONS - Miscellaneous []  - External ear exam 0 []  - 0 Patient Transfer (multiple staff / Civil Service fast streamer / Similar devices) []  - 0 Simple Staple / Suture removal (25 or  less) []  - 0 Complex Staple / Suture removal (26 or more) []  - 0 Hypo/Hyperglycemic Management (do not check if billed separately) X- 1 15 Ankle / Brachial Index (ABI) - do not check if billed separately Has the patient been seen at the hospital within the last three years: Yes Total Score: 100 Level Of Care: New/Established -  Level 3 HOLTON, SIDMAN ([id number]) Electronic Signature(s) Signed: 12/25/2020 10:19:34 AM By: Billy Coria RN Entered By: Billy Franco on 12/24/2020 09:50:58 Billy Franco ([id number]) -------------------------------------------------------------------------------- Encounter Discharge Information Details Patient Name: Billy Franco Date of Service: 12/24/2020 8:45 AM Medical Record Number: [id number] Patient Account Number: [id number] Date of Birth/Sex: 03-11-1940 (80 y.o. M) Treating RN: Billy Franco Primary Care Billy Franco: Billy Franco Other Clinician: Referring Billy Franco: Billy Franco Treating Billy Franco/Extender: Billy Franco in Treatment: 0 Encounter Discharge Information Items Post Procedure Vitals Discharge Condition: Stable Temperature (F): 97.6 Ambulatory Status: Ambulatory Pulse (bpm): 64 Discharge Destination: Home Respiratory Rate (breaths/min): 18 Transportation: Private Auto Blood Pressure (mmHg): 100/59 Accompanied By: wife Schedule Follow-up Appointment: Yes Clinical Summary of Care: Patient Declined Electronic Signature(s) Signed: 12/25/2020 10:19:34 AM By: Billy Coria RN Entered By: Billy Franco on 12/24/2020 09:52:26 Billy Franco ([id number]) -------------------------------------------------------------------------------- Lower Extremity Assessment Details Patient Name: Billy Franco Date of Service: 12/24/2020 8:45 AM Medical Record Number: [id number] Patient Account Number: [id number] Date of Birth/Sex: [birth year] (80 y.o. M) Treating RN: Billy Franco Primary Care Aiyden Lauderback: Billy Franco Other  Clinician: Referring Billy Franco: Billy Franco Treating Tenleigh Byer/Extender: Billy Franco Weeks in Treatment: 0 Edema Assessment Assessed: [Left: No] [Right: No] [Left: Edema] [Right: :] Calf Left: Right: Point of Measurement: 36 cm From Medial Instep 32.5 cm Ankle Left: Right: Point of Measurement: 10 cm From Medial Instep 22 cm Knee To Floor Left: Right: From Medial Instep 44 cm Vascular Assessment Blood Pressure: Brachial: [Right:100] Ankle: [Right:Dorsalis Pedis: 130 1.30] Electronic Signature(s) Signed: 12/25/2020 10:19:34 AM By: Billy Coria RN Entered By: Billy Franco on 12/24/2020 09:31:29 Billy Franco ([id number]) -------------------------------------------------------------------------------- Multi Wound Chart Details Patient Name: Billy Franco Date of Service: 12/24/2020 8:45 AM Medical Record Number: [id number] Patient Account Number: [id number] Date of Birth/Sex: 1940/05/15 (80 y.o. M) Treating RN: Billy Franco Primary Care Terianne Thaker: Billy Franco Other Clinician: Referring Jonell Brumbaugh: Billy Franco Treating Biruk Troia/Extender: Billy Franco in Treatment: 0 Vital Signs Height(in): 67 Capillary Blood Glucose 150 (mg/dl): Weight(lbs): 150 Pulse(bpm): 64 Body Mass Index(BMI): 23 Blood Pressure(mmHg): 100/59 Temperature(F): 97.6 Respiratory Rate(breaths/min): 18 Photos: [N/A:N/A] Wound Location: Right, Lateral Lower Leg N/A N/A Wounding Event: Bite N/A N/A Primary Etiology: Diabetic Wound/Ulcer of the Lower N/A N/A Extremity Comorbid History: Congestive Heart Failure, N/A N/A Hypertension, Type II Diabetes Date Acquired: 11/16/2020 N/A N/A Weeks of Treatment: 0 N/A N/A Wound Status: Open N/A N/A Measurements L x W x D (cm) 2x1.2x0.5 N/A N/A Area (cm) : 1.885 N/A N/A Volume (cm) : 0.942 N/A N/A % Reduction in Area: 0.00% N/A N/A % Reduction in Volume: 0.00% N/A N/A Classification: Grade 2 N/A N/A Exudate Amount: Medium N/A N/A Exudate Type:  Serosanguineous N/A N/A Exudate Color: red, brown N/A N/A Granulation Amount: None Present (0%) N/A N/A Necrotic Amount: Large (67-100%) N/A N/A Necrotic Tissue: Eschar, Adherent Slough N/A N/A Exposed Structures: Fascia: No N/A N/A Fat Layer (Subcutaneous Tissue): No Tendon: No Muscle: No Joint: No Bone: No Epithelialization: None N/A N/A Treatment Notes JAGER, KOSKA ([id number]) Electronic Signature(s) Signed: 12/25/2020 10:19:34 AM By: Billy Coria RN Entered By: Billy Franco on 12/24/2020 09:44:04 Billy Franco ([id number]) -------------------------------------------------------------------------------- Multi-Disciplinary Care Plan Details Patient Name: Billy Franco Date of Service: 12/24/2020 8:45 AM Medical Record Number: [id number] Patient Account Number: [id number] Date of Birth/Sex: 02-26-1940 (80 y.o. M) Treating RN: Billy Franco Primary Care Nakeesha Bowler: Billy Franco Other Clinician: Referring Mikaele Stecher: Billy Franco Treating Lilian Fuhs/Extender: Billy Franco in Treatment: 0 Active Inactive Wound/Skin Impairment Nursing Diagnoses: Knowledge deficit related to ulceration/compromised skin integrity Goals: Patient/caregiver  will verbalize understanding of skin care regimen Date Initiated: 12/24/2020 Target Resolution Date: 01/24/2021 Goal Status: Active Ulcer/skin breakdown will have a volume reduction of 30% by week 4 Date Initiated: 12/24/2020 Target Resolution Date: 02/24/2021 Goal Status: Active Ulcer/skin breakdown will have a volume reduction of 50% by week 8 Date Initiated: 12/24/2020 Target Resolution Date: 03/24/2021 Goal Status: Active Ulcer/skin breakdown will have a volume reduction of 80% by week 12 Date Initiated: 12/24/2020 Target Resolution Date: 04/24/2021 Goal Status: Active Ulcer/skin breakdown will heal within 14 weeks Date Initiated: 12/24/2020 Target Resolution Date: 05/24/2021 Goal Status: Active Interventions: Assess patient/caregiver  ability to obtain necessary supplies Assess patient/caregiver ability to perform ulcer/skin care regimen upon admission and as needed Assess ulceration(s) every visit Notes: Electronic Signature(s) Signed: 12/25/2020 10:19:34 AM By: Billy Coria RN Entered By: Billy Franco on 12/24/2020 09:43:32 Billy Franco ([id number]) -------------------------------------------------------------------------------- Pain Assessment Details Patient Name: Billy Franco Date of Service: 12/24/2020 8:45 AM Medical Record Number: [id number] Patient Account Number: [id number] Date of Birth/Sex: [birth year] (80 y.o. M) Treating RN: Billy Franco Primary Care Giankarlo Leamer: Billy Franco Other Clinician: Referring Amara Manalang: Billy Franco Treating Orissa Arreaga/Extender: Billy Franco in Treatment: 0 Active Problems Location of Pain Severity and Description of Pain Patient Has Paino No Site Locations Pain Management and Medication Current Pain Management: Electronic Signature(s) Signed: 12/25/2020 10:19:34 AM By: Billy Coria RN Entered By: Billy Franco on 12/24/2020 09:05:45 Billy Franco ([id number]) -------------------------------------------------------------------------------- Patient/Caregiver Education Details Patient Name: Billy Franco Date of Service: 12/24/2020 8:45 AM Medical Record Number: [id number] Patient Account Number: [id number] Date of Birth/Gender: [birth year] (80 y.o. M) Treating RN: Billy Franco Primary Care Physician: Billy Franco Other Clinician: Referring Physician: Hulan Franco Treating Physician/Extender: Billy Franco in Treatment: 0 Education Assessment Education Provided To: Patient Education Topics Provided Wound/Skin Impairment: Methods: Explain/Verbal Responses: State content correctly Electronic Signature(s) Signed: 12/25/2020 10:19:34 AM By: Billy Coria RN Entered By: Billy Franco on 12/24/2020 Ardoch, Semaje  ([id number]) -------------------------------------------------------------------------------- Wound Assessment Details Patient Name: Billy Franco Date of Service: 12/24/2020 8:45 AM Medical Record Number: [id number] Patient Account Number: [id number] Date of Birth/Sex: [birth year] (80 y.o. M) Treating RN: Billy Franco Primary Care Harlem Thresher: Billy Franco Other Clinician: Referring Saphyre Cillo: Billy Franco Treating Annleigh Knueppel/Extender: Billy Franco Weeks in Treatment: 0 Wound Status Wound Number: 1 Primary Etiology: Diabetic Wound/Ulcer of the Lower Extremity Wound Location: Right, Lateral Lower Leg Wound Status: Open Wounding Event: Bite Comorbid Congestive Heart Failure, Hypertension, Type II History: Diabetes Date Acquired: 11/16/2020 Weeks Of Treatment: 0 Clustered Wound: No Photos Wound Measurements Length: (cm) 2 % Redu Width: (cm) 1.2 % Redu Depth: (cm) 0.5 Epithe Area: (cm) 1.885 Tunne Volume: (cm) 0.942 Under ction in Area: 0% ction in Volume: 0% lialization: None ling: No mining: No Wound Description Classification: Grade 2 Foul O Exudate Amount: Medium Slough Exudate Type: Serosanguineous Exudate Color: red, brown dor After Cleansing: No /Fibrino Yes Wound Bed Granulation Amount: None Present (0%) Exposed Structure Necrotic Amount: Large (67-100%) Fascia Exposed: No Necrotic Quality: Eschar, Adherent Slough Fat Layer (Subcutaneous Tissue) Exposed: No Tendon Exposed: No Muscle Exposed: No Joint Exposed: No Mcilhenny, Oshua ([id number]) Bone Exposed: No Treatment Notes Wound #1 (Lower Leg) Wound Laterality: Right, Lateral Cleanser Byram Ancillary Kit - 15 Day Supply Discharge Instruction: Use supplies as instructed; Kit contains: (15) Saline Bullets; (15) 3x3 Gauze; 15 pr Gloves Soap and Water Discharge Instruction: Gently cleanse wound with antibacterial soap, rinse and pat dry prior to dressing wounds Peri-Wound Care Topical Primary Dressing Prisma  4.34 (in) Discharge Instruction: Moisten w/normal  saline or sterile water; Cover wound as directed. Do not remove from wound bed. Secondary Dressing ABD Pad 5x9 (in/in) Discharge Instruction: Cover with ABD pad Secured With 74M Medipore H Soft Cloth Surgical Tape, 2x2 (in/yd) Compression Wrap tubi grip Discharge Instruction: size c Compression Stockings Add-Ons Electronic Signature(s) Signed: 12/24/2020 10:15:58 AM By: Billy Coria RN Previous Signature: 12/24/2020 10:15:21 AM Version By: Billy Coria RN Previous Signature: 12/24/2020 10:14:41 AM Version By: Billy Coria RN Previous Signature: 12/24/2020 10:14:07 AM Version By: Billy Coria RN Previous Signature: 12/24/2020 10:12:32 AM Version By: Billy Coria RN Entered By: Billy Franco on 12/24/2020 10:15:58 Billy Franco ([id number]) -------------------------------------------------------------------------------- Vitals Details Patient Name: Billy Franco Date of Service: 12/24/2020 8:45 AM Medical Record Number: [id number] Patient Account Number: [id number] Date of Birth/Sex: 07-31-1940 (80 y.o. M) Treating RN: Billy Franco Primary Care Adaleigh Warf: Billy Franco Other Clinician: Referring Taner Rzepka: Billy Franco Treating Rakeya Glab/Extender: Billy Franco in Treatment: 0 Vital Signs Time Taken: 09:05 Temperature (F): 97.6 Height (in): 67 Pulse (bpm): 64 Source: Stated Respiratory Rate (breaths/min): 18 Weight (lbs): 150 Blood Pressure (mmHg): 100/59 Source: Stated Capillary Blood Glucose (mg/dl): 150 Body Mass Index (BMI): 23.5 Reference Range: 80 - 120 mg / dl Notes CBG per patient Electronic Signature(s) Signed: 12/25/2020 10:19:34 AM By: Billy Coria RN Entered By: Billy Franco on 12/24/2020 09:06:23

## 2020-12-25 NOTE — Progress Notes (Signed)
KORBEN, CARCIONE ([id number]) Visit Report for 12/24/2020 Chief Complaint Document Details Patient Name: Billy Franco, Billy Franco Date of Service: 12/24/2020 8:45 AM Medical Record Number: [id number] Patient Account Number: [id number] Date of Birth/Sex: Jan 06, 1941 (80 y.o. M) Treating RN: Carlene Coria Primary Care Provider: Steele Sizer Other Clinician: Referring Provider: Hulan Saas Treating Provider/Extender: Skipper Cliche in Treatment: 0 Information Obtained from: Patient Chief Complaint Right Leg Dog Bite Electronic Signature(s) Signed: 12/24/2020 9:41:12 AM By: Worthy Keeler PA-C Previous Signature: 12/24/2020 9:40:48 AM Version By: Worthy Keeler PA-C Entered By: Worthy Keeler on 12/24/2020 Page, [place] ([id number]) -------------------------------------------------------------------------------- Debridement Details Patient Name: Billy Franco Date of Service: 12/24/2020 8:45 AM Medical Record Number: [id number] Patient Account Number: [id number] Date of Birth/Sex: [birth year] (80 y.o. M) Treating RN: Carlene Coria Primary Care Provider: Steele Sizer Other Clinician: Referring Provider: Hulan Saas Treating Provider/Extender: Skipper Cliche in Treatment: 0 Debridement Performed for Wound #1 Right,Lateral Lower Leg Assessment: Performed By: Physician Tommie Sams., PA-C Debridement Type: Debridement Severity of Tissue Pre Debridement: Fat layer exposed Level of Consciousness (Pre- Awake and Alert procedure): Pre-procedure Verification/Time Out Yes - 09:43 Taken: Start Time: 09:43 Pain Control: Lidocaine 4% Topical Solution Total Area Debrided (L x W): 2 (cm) x 1.2 (cm) = 2.4 (cm) Tissue and other material Viable, Non-Viable, Eschar, Slough, Subcutaneous, Skin: Dermis , Skin: Epidermis, Slough debrided: Level: Skin/Subcutaneous Tissue Debridement Description: Excisional Instrument: Curette Bleeding: Moderate Hemostasis Achieved: Pressure End  Time: 09:47 Procedural Pain: 0 Post Procedural Pain: 0 Response to Treatment: Procedure was tolerated well Level of Consciousness (Post- Awake and Alert procedure): Post Debridement Measurements of Total Wound Length: (cm) 2 Width: (cm) 1.2 Depth: (cm) 0.5 Volume: (cm) 0.942 Character of Wound/Ulcer Post Debridement: Improved Severity of Tissue Post Debridement: Fat layer exposed Post Procedure Diagnosis Same as Pre-procedure Electronic Signature(s) Signed: 12/25/2020 9:26:06 AM By: Worthy Keeler PA-C Signed: 12/25/2020 10:19:34 AM By: Carlene Coria RN Entered By: Carlene Coria on 12/24/2020 09:45:35 Billy Franco ([id number]) -------------------------------------------------------------------------------- HPI Details Patient Name: Billy Franco Date of Service: 12/24/2020 8:45 AM Medical Record Number: [id number] Patient Account Number: [id number] Date of Birth/Sex: [birth year] (80 y.o. M) Treating RN: Carlene Coria Primary Care Provider: Steele Sizer Other Clinician: Referring Provider: Hulan Saas Treating Provider/Extender: Skipper Cliche in Treatment: 0 History of Present Illness HPI Description: 12/24/2020 upon evaluation today patient appears to be doing somewhat poorly in regard to the wound that actually occurred around November 13 2020. This was actually a dog bite he is unsure of what type of dog it was it was a small wound that wound was not a familiar dog to him. Subsequently he ended up being seen by his primary care provider in October 28 by his primary care provider. Subsequently they originally placed him on 2 rounds of amoxicillin he is also been on doxycycline. Unfortunately this just does not seem to be getting better despite the treatment up to this point they have been initially cleaning with alcohol although that has been discontinued when he went to the ER on November 14. They actually recommended cleaning with just soap and water. There is really not  been any specific dressings that were recommended at this time. Subsequently the patient does have an ABI of 1.3 here in the clinic today. This is on the right. Subsequently also has a recent hemoglobin A1c from when he was at the ER of 6.3. He really is not having any significant pain which is good news he does have some swelling which  is going to need to be addressed as well. This is minimal however I do not think he has to have a compression wrap I think Tubigrip would probably be appropriate. Patient does have a history of diabetes mellitus type 2, congestive heart failure, atrial fibrillation, and hypertension. Electronic Signature(s) Signed: 12/24/2020 5:24:56 PM By: Worthy Keeler PA-C Previous Signature: 12/24/2020 5:20:36 PM Version By: Worthy Keeler PA-C Entered By: Worthy Keeler on 12/24/2020 17:24:56 Billy Franco ([id number]) -------------------------------------------------------------------------------- Physical Exam Details Patient Name: Billy Franco Date of Service: 12/24/2020 8:45 AM Medical Record Number: [id number] Patient Account Number: [id number] Date of Birth/Sex: November 07, 1940 (80 y.o. M) Treating RN: Carlene Coria Primary Care Provider: Steele Sizer Other Clinician: Referring Provider: Hulan Saas Treating Provider/Extender: Skipper Cliche in Treatment: 0 Constitutional sitting or standing blood pressure is within target range for patient.. pulse regular and within target range for patient.Marland Kitchen respirations regular, non- labored and within target range for patient.Marland Kitchen temperature within target range for patient.. Well-nourished and well-hydrated in no acute distress. Eyes conjunctiva clear no eyelid edema noted. pupils equal round and reactive to light and accommodation. Ears, Nose, Mouth, and Throat no gross abnormality of ear auricles or external auditory canals. normal hearing noted during conversation. mucus membranes moist. Respiratory normal breathing  without difficulty. Cardiovascular 2+ dorsalis pedis/posterior tibialis pulses. no clubbing, cyanosis, significant edema, <3 sec cap refill. Musculoskeletal normal gait and posture. no significant deformity or arthritic changes, no loss or range of motion, no clubbing. Psychiatric this patient is able to make decisions and demonstrates good insight into disease process. Alert and Oriented x 3. pleasant and cooperative. Notes Upon inspection patient's wound bed actually showed signs of significant necrotic tissue that did need to be removed today. Subsequently I was able to perform sharp debridement using a curette to clear this away there was a little bit of a hematoma with some of the areas of undermining. He tolerated the debridement without any significant pain which was also great news. Overall again he does not have too much swelling but I think a Tubigrip could be beneficial for him. Electronic Signature(s) Signed: 12/24/2020 5:25:33 PM By: Worthy Keeler PA-C Entered By: Worthy Keeler on 12/24/2020 17:25:33 Billy Franco ([id number]) -------------------------------------------------------------------------------- Physician Orders Details Patient Name: Billy Franco Date of Service: 12/24/2020 8:45 AM Medical Record Number: [id number] Patient Account Number: [id number] Date of Birth/Sex: 07-22-1940 (80 y.o. M) Treating RN: Carlene Coria Primary Care Provider: Steele Sizer Other Clinician: Referring Provider: Hulan Saas Treating Provider/Extender: Skipper Cliche in Treatment: 0 Verbal / Phone Orders: No Diagnosis Coding ICD-10 Coding Code Description W54.0XXA Bitten by dog, initial encounter [phone] Non-pressure chronic ulcer of other part of right lower leg with fat layer exposed E11.622 Type 2 diabetes mellitus with other skin ulcer I50.42 Chronic combined systolic (congestive) and diastolic (congestive) heart failure I48.0 Paroxysmal atrial fibrillation I10  Essential (primary) hypertension Follow-up Appointments o Return Appointment in 1 week. Bathing/ Shower/ Hygiene o May shower; gently cleanse wound with antibacterial soap, rinse and pat dry prior to dressing wounds - may shower on days dressing changed, remove dressing, shower, reapply dressing Edema Control - Lymphedema / Segmental Compressive Device / Other o Tubigrip single layer applied. - side c o Elevate, Exercise Daily and Avoid Standing for Long Periods of Time. o Elevate legs to the level of the heart and pump ankles as often as possible o Elevate leg(s) parallel to the floor when sitting. Wound Treatment Wound #1 - Lower Leg Wound Laterality: Right, Lateral  Cleanser: Byram Ancillary Kit - 15 Day Supply (DME) (Generic) 3 x Per Week/30 Days Discharge Instructions: Use supplies as instructed; Kit contains: (15) Saline Bullets; (15) 3x3 Gauze; 15 pr Gloves Cleanser: Soap and Water 3 x Per Week/30 Days Discharge Instructions: Gently cleanse wound with antibacterial soap, rinse and pat dry prior to dressing wounds Primary Dressing: Prisma 4.34 (in) (DME) (Generic) 3 x Per Week/30 Days Discharge Instructions: Moisten w/normal saline or sterile water; Cover wound as directed. Do not remove from wound bed. Secondary Dressing: ABD Pad 5x9 (in/in) (DME) (Generic) 3 x Per Week/30 Days Discharge Instructions: Cover with ABD pad Secured With: 37M Medipore H Soft Cloth Surgical Tape, 2x2 (in/yd) (DME) (Generic) 3 x Per Week/30 Days Compression Wrap: tubi grip 3 x Per Week/30 Days Discharge Instructions: size c Electronic Signature(s) Signed: 12/25/2020 9:26:06 AM By: Worthy Keeler PA-C Signed: 12/25/2020 10:19:34 AM By: Carlene Coria RN Entered By: Carlene Coria on 12/24/2020 10:01:43 Billy Franco ([id number]) -------------------------------------------------------------------------------- Problem List Details Patient Name: Billy Franco Date of Service: 12/24/2020 8:45  AM Medical Record Number: [id number] Patient Account Number: [id number] Date of Birth/Sex: 1940-07-18 (80 y.o. M) Treating RN: Carlene Coria Primary Care Provider: Steele Sizer Other Clinician: Referring Provider: Hulan Saas Treating Provider/Extender: Skipper Cliche in Treatment: 0 Active Problems ICD-10 Encounter Code Description Active Date MDM Diagnosis W54.0XXA Bitten by dog, initial encounter 12/24/2020 No Yes L97.812 Non-pressure chronic ulcer of other part of right lower leg with fat layer 12/24/2020 No Yes exposed E11.622 Type 2 diabetes mellitus with other skin ulcer 12/24/2020 No Yes I50.42 Chronic combined systolic (congestive) and diastolic (congestive) heart 12/24/2020 No Yes failure I48.0 Paroxysmal atrial fibrillation 12/24/2020 No Yes I10 Essential (primary) hypertension 12/24/2020 No Yes Inactive Problems Resolved Problems Electronic Signature(s) Signed: 12/24/2020 9:39:53 AM By: Worthy Keeler PA-C Entered By: Worthy Keeler on 12/24/2020 09:39:53 Billy Franco ([id number]) -------------------------------------------------------------------------------- Progress Note Details Patient Name: Billy Franco Date of Service: 12/24/2020 8:45 AM Medical Record Number: [id number] Patient Account Number: [id number] Date of Birth/Sex: 03-Jun-1940 (80 y.o. M) Treating RN: Carlene Coria Primary Care Provider: Steele Sizer Other Clinician: Referring Provider: Hulan Saas Treating Provider/Extender: Skipper Cliche in Treatment: 0 Subjective Chief Complaint Information obtained from Patient Right Leg Dog Bite History of Present Illness (HPI) 12/24/2020 upon evaluation today patient appears to be doing somewhat poorly in regard to the wound that actually occurred around November 13 2020. This was actually a dog bite he is unsure of what type of dog it was it was a small wound that wound was not a familiar dog to him. Subsequently he ended up being seen by his  primary care provider in October 28 by his primary care provider. Subsequently they originally placed him on 2 rounds of amoxicillin he is also been on doxycycline. Unfortunately this just does not seem to be getting better despite the treatment up to this point they have been initially cleaning with alcohol although that has been discontinued when he went to the ER on November 14. They actually recommended cleaning with just soap and water. There is really not been any specific dressings that were recommended at this time. Subsequently the patient does have an ABI of 1.3 here in the clinic today. This is on the right. Subsequently also has a recent hemoglobin A1c from when he was at the ER of 6.3. He really is not having any significant pain which is good news he does have some swelling which is going to need to be addressed as  well. This is minimal however I do not think he has to have a compression wrap I think Tubigrip would probably be appropriate. Patient does have a history of diabetes mellitus type 2, congestive heart failure, atrial fibrillation, and hypertension. Patient History Information obtained from Patient. Allergies codeine, lovastatin Social History Never smoker, Marital Status - Married, Alcohol Use - Never, Drug Use - No History, Caffeine Use - Daily. Medical History Cardiovascular Patient has history of Congestive Heart Failure, Hypertension Endocrine Patient has history of Type II Diabetes Patient is treated with Oral Agents. Blood sugar is tested. Blood sugar results noted at the following times: Breakfast - 130. Review of Systems (ROS) Constitutional Symptoms (General Health) Denies complaints or symptoms of Fatigue, Fever, Chills, Marked Weight Change. Integumentary (Skin) Complains or has symptoms of Wounds. Objective Constitutional sitting or standing blood pressure is within target range for patient.. pulse regular and within target range for patient.Marland Kitchen  respirations regular, non- labored and within target range for patient.Marland Kitchen temperature within target range for patient.. Well-nourished and well-hydrated in no acute distress. Vitals Time Taken: 9:05 AM, Height: 67 in, Source: Stated, Weight: 150 lbs, Source: Stated, BMI: 23.5, Temperature: 97.6 F, Pulse: 64 bpm, Respiratory Rate: 18 breaths/min, Blood Pressure: 100/59 mmHg, Capillary Blood Glucose: 150 mg/dl. General Notes: CBG per patient Eyes Hamberger, Lori ([id number]) conjunctiva clear no eyelid edema noted. pupils equal round and reactive to light and accommodation. Ears, Nose, Mouth, and Throat no gross abnormality of ear auricles or external auditory canals. normal hearing noted during conversation. mucus membranes moist. Respiratory normal breathing without difficulty. Cardiovascular 2+ dorsalis pedis/posterior tibialis pulses. no clubbing, cyanosis, significant edema, Musculoskeletal normal gait and posture. no significant deformity or arthritic changes, no loss or range of motion, no clubbing. Psychiatric this patient is able to make decisions and demonstrates good insight into disease process. Alert and Oriented x 3. pleasant and cooperative. General Notes: Upon inspection patient's wound bed actually showed signs of significant necrotic tissue that did need to be removed today. Subsequently I was able to perform sharp debridement using a curette to clear this away there was a little bit of a hematoma with some of the areas of undermining. He tolerated the debridement without any significant pain which was also great news. Overall again he does not have too much swelling but I think a Tubigrip could be beneficial for him. Integumentary (Hair, Skin) Wound #1 status is Open. Original cause of wound was Bite. The date acquired was: 11/16/2020. The wound is located on the Right,Lateral Lower Leg. The wound measures 2cm length x 1.2cm width x 0.5cm depth; 1.885cm^2 area and 0.942cm^3  volume. There is no tunneling or undermining noted. There is a medium amount of serosanguineous drainage noted. There is no granulation within the wound bed. There is a large (67-100%) amount of necrotic tissue within the wound bed including Eschar and Adherent Slough. Assessment Active Problems ICD-10 Bitten by dog, initial encounter Non-pressure chronic ulcer of other part of right lower leg with fat layer exposed Type 2 diabetes mellitus with other skin ulcer Chronic combined systolic (congestive) and diastolic (congestive) heart failure Paroxysmal atrial fibrillation Essential (primary) hypertension Procedures Wound #1 Pre-procedure diagnosis of Wound #1 is a Diabetic Wound/Ulcer of the Lower Extremity located on the Right,Lateral Lower Leg .Severity of Tissue Pre Debridement is: Fat layer exposed. There was a Excisional Skin/Subcutaneous Tissue Debridement with a total area of 2.4 sq cm performed by Tommie Sams., PA-C. With the following instrument(s): Curette to remove Viable and  Non-Viable tissue/material. Material removed includes Eschar, Subcutaneous Tissue, Slough, Skin: Dermis, and Skin: Epidermis after achieving pain control using Lidocaine 4% Topical Solution. No specimens were taken. A time out was conducted at 09:43, prior to the start of the procedure. A Moderate amount of bleeding was controlled with Pressure. The procedure was tolerated well with a pain level of 0 throughout and a pain level of 0 following the procedure. Post Debridement Measurements: 2cm length x 1.2cm width x 0.5cm depth; 0.942cm^3 volume. Character of Wound/Ulcer Post Debridement is improved. Severity of Tissue Post Debridement is: Fat layer exposed. Post procedure Diagnosis Wound #1: Same as Pre-Procedure Plan Follow-up Appointments: Return Appointment in 1 week. Bathing/ Shower/ Hygiene: May shower; gently cleanse wound with antibacterial soap, rinse and pat dry prior to dressing wounds - may  shower on days dressing changed, remove dressing, shower, reapply dressing Edema Control - Lymphedema / Segmental Compressive Device / Other: Tubigrip single layer applied. - side c Billy Franco, Billy Franco ([id number]) Elevate, Exercise Daily and Avoid Standing for Long Periods of Time. Elevate legs to the level of the heart and pump ankles as often as possible Elevate leg(s) parallel to the floor when sitting. WOUND #1: - Lower Leg Wound Laterality: Right, Lateral Cleanser: Byram Ancillary Kit - 15 Day Supply (DME) (Generic) 3 x Per Week/30 Days Discharge Instructions: Use supplies as instructed; Kit contains: (15) Saline Bullets; (15) 3x3 Gauze; 15 pr Gloves Cleanser: Soap and Water 3 x Per Week/30 Days Discharge Instructions: Gently cleanse wound with antibacterial soap, rinse and pat dry prior to dressing wounds Primary Dressing: Prisma 4.34 (in) (DME) (Generic) 3 x Per Week/30 Days Discharge Instructions: Moisten w/normal saline or sterile water; Cover wound as directed. Do not remove from wound bed. Secondary Dressing: ABD Pad 5x9 (in/in) (DME) (Generic) 3 x Per Week/30 Days Discharge Instructions: Cover with ABD pad Secured With: 101M Medipore H Soft Cloth Surgical Tape, 2x2 (in/yd) (DME) (Generic) 3 x Per Week/30 Days Compression Wrap: tubi grip 3 x Per Week/30 Days Discharge Instructions: size c 1. Would recommend currently that we actually initiate treatment with a silver collagen dressing especially tucked into the areas of undermining I think this can be beneficial. 2. We will cover with an ABD pad and roll gauze to secure in place followed by Tubigrip. 3. I am also can recommend the patient continue to elevate his leg to make sure this does not swell. 4. I am also can recommend that we not place him on any additional antibiotics we will continue to monitor for signs of infection if anything changes he should let me know. 5. MolecuLight imaging really did not show any significant signals  that I think need to be addressed there was a slight amount of blush in the perimeter of the wound but postdebridement this was cleaned away quite readily. We will see patient back for reevaluation in 1 week here in the clinic. If anything worsens or changes patient will contact our office for additional recommendations. MolecuLight DX: 1st Scanned Wound The following wound was scanned with MolecuLight DX): Right lateral lower leg Fluorescence bacterial imaging was medically necessary today due to Initial Evaluation of the wound with MolecuLightDX to determine (Indication): baseline bacterial bioburden level MolecuLight Results Blush Colors, Green Colors The indicated colors were noted in the following area(s). In the periphery of the wound As a result of todayos scan, the following treatment plans were put in Targeted debridement performed no oral antibiotics indicated place. MolecuLight Procedure The MolecularLight DX device was  cleaned with a disinfectant wipe prior to use., The correct patient profile was confirmed and correct wound was verified., Range finder sensor used to ensure appropriate distance The following was completed: selected between imaging unit and wound bed, Room lights were turned off and the ambient light sensor was checked., Blue circle appeared around the lightbulb., The fluorescence icon was selected. Screen was tapped to enhance focus and the image was captured. Potential ICD-10 Codes ICD-10 A49.9 Bacterial infection unspecified (Red/Blush/Yellow Color) Additional Scanned Wounds Did you scan any additional Woundso No Electronic Signature(s) Signed: 12/24/2020 5:27:15 PM By: Worthy Keeler PA-C Entered By: Worthy Keeler on 12/24/2020 17:27:15 Billy Franco ([id number]) -------------------------------------------------------------------------------- ROS/PFSH Details Patient Name: Billy Franco Date of Service: 12/24/2020 8:45 AM Medical Record Number:  [id number] Patient Account Number: [id number] Date of Birth/Sex: Feb 29, 1940 (80 y.o. M) Treating RN: Carlene Coria Primary Care Provider: Steele Sizer Other Clinician: Referring Provider: Hulan Saas Treating Provider/Extender: Skipper Cliche in Treatment: 0 Information Obtained From Patient Constitutional Symptoms (General Health) Complaints and Symptoms: Negative for: Fatigue; Fever; Chills; Marked Weight Change Integumentary (Skin) Complaints and Symptoms: Positive for: Wounds Cardiovascular Medical History: Positive for: Congestive Heart Failure; Hypertension Endocrine Medical History: Positive for: Type II Diabetes Time with diabetes: 20 Treated with: Oral agents Blood sugar tested every day: Yes Tested : daily Blood sugar testing results: Breakfast: 130 Immunizations Pneumococcal Vaccine: Received Pneumococcal Vaccination: Yes Received Pneumococcal Vaccination On or After 60th Birthday: Yes Implantable Devices No devices added Family and Social History Never smoker; Marital Status - Married; Alcohol Use: Never; Drug Use: No History; Caffeine Use: Daily; Financial Concerns: No; Food, Clothing or Shelter Needs: No; Support System Lacking: No; Transportation Concerns: No Electronic Signature(s) Signed: 12/25/2020 9:26:06 AM By: Worthy Keeler PA-C Signed: 12/25/2020 10:19:34 AM By: Carlene Coria RN Entered By: Carlene Coria on 12/24/2020 09:09:28 Billy Franco ([id number]) -------------------------------------------------------------------------------- SuperBill Details Patient Name: Billy Franco Date of Service: 12/24/2020 Medical Record Number: [id number] Patient Account Number: [id number] Date of Birth/Sex: Apr 03, 1940 (80 y.o. M) Treating RN: Carlene Coria Primary Care Provider: Steele Sizer Other Clinician: Referring Provider: Hulan Saas Treating Provider/Extender: Skipper Cliche in Treatment: 0 Diagnosis Coding ICD-10 Codes Code  Description [phone] Bitten by dog, initial encounter [phone] Non-pressure chronic ulcer of other part of right lower leg with fat layer exposed E11.622 Type 2 diabetes mellitus with other skin ulcer I50.42 Chronic combined systolic (congestive) and diastolic (congestive) heart failure I48.0 Paroxysmal atrial fibrillation I10 Essential (primary) hypertension Facility Procedures CPT4 Code: [id number] Description: 99213 - WOUND CARE VISIT-LEV 3 EST PT Modifier: Quantity: 1 CPT4 Code: [id number] Description: 84665 - DEB SUBQ TISSUE 20 SQ CM/< Modifier: Quantity: 1 CPT4 Code: Description: ICD-10 Diagnosis Description E11.622 Type 2 diabetes mellitus with other skin ulcer Modifier: Quantity: CPT4 Code: [id number] Description: NONCNTACT RT FLORO WND 1ST STE (CDM [id number]) Modifier: Quantity: 1 CPT4 Code: Description: ICD-10 Diagnosis Description P23.300 Non-pressure chronic ulcer of other part of right lower leg with fat layer Modifier: exposed Quantity: Physician Procedures CPT4 Code: 7622633 Description: 35456 - WC PHYS LEVEL 4 - NEW PT Modifier: 25 Quantity: 1 CPT4 Code: Description: ICD-10 Diagnosis Description W54.0XXA Bitten by dog, initial encounter [phone] 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 (congestive) heart fa Modifier: exposed ilure Quantity: CPT4 Code: 3734287 Description: 11042 - WC PHYS SUBQ TISS 20 SQ CM Modifier: Quantity: 1 CPT4 Code: Description: ICD-10 Diagnosis  Description E11.622 Type 2 diabetes mellitus with other skin ulcer Modifier: Quantity: CPT4 Code: 3570V Description: HC MOLECULIGHT WND IMG 1ST ANATOMIC SITE Modifier: Quantity: 1 CPT4 Code: Description: ICD-10 Diagnosis Description X79.390 Non-pressure chronic ulcer of other part of right lower leg with fat layer Modifier:  exposed Quantity: Electronic Signature(s) Signed: 12/24/2020 5:28:12 PM By: Worthy Keeler PA-C Entered By: Worthy Keeler on 12/24/2020 17:28:12

## 2020-12-25 NOTE — Progress Notes (Signed)
Billy Franco, Billy Franco ([id number]) Visit Report for 12/24/2020 Abuse/Suicide Risk Screen Details Patient Name: Billy Franco, Billy Franco Date of Service: 12/24/2020 8:45 AM Medical Record Number: [id number] Patient Account Number: [id number] Date of Birth/Sex: [birth year] (80 y.o. M) Treating RN: Carlene Coria Primary Care Aqua Denslow: Steele Sizer Other Clinician: Referring Josedejesus Marcum: Hulan Saas Treating Lamyra Malcolm/Extender: Skipper Cliche in Treatment: 0 Abuse/Suicide Risk Screen Items Answer ABUSE RISK SCREEN: Has anyone close to you tried to hurt or harm you recentlyo No Do you feel uncomfortable with anyone in your familyo No Has anyone forced you do things that you didnot want to doo No Electronic Signature(s) Signed: 12/25/2020 10:19:34 AM By: Carlene Coria RN Entered By: Carlene Coria on 12/24/2020 09:09:35 Billy Franco ([id number]) -------------------------------------------------------------------------------- Activities of Daily Living Details Patient Name: Billy Franco Date of Service: 12/24/2020 8:45 AM Medical Record Number: [id number] Patient Account Number: [id number] Date of Birth/Sex: Aug 04, 1940 (80 y.o. M) Treating RN: Carlene Coria Primary Care Deyra Perdomo: Steele Sizer Other Clinician: Referring Tiffony Kite: Hulan Saas Treating Asucena Galer/Extender: Skipper Cliche in Treatment: 0 Activities of Daily Living Items Answer Activities of Daily Living (Please select one for each item) Drive Automobile Completely Able Take Medications Completely Able Use Telephone Completely Able Care for Appearance Completely Able Use Toilet Completely Able Bath / Shower Completely Able Dress Self Completely Able Feed Self Completely Able Walk Completely Able Get In / Out Bed Completely Able Housework Completely Able Prepare Meals Completely [place] for Self Completely Able Electronic Signature(s) Signed: 12/25/2020 10:19:34 AM By: Carlene Coria RN Entered  By: Carlene Coria on 12/24/2020 [place], [place] ([id number]) -------------------------------------------------------------------------------- Education Screening Details Patient Name: Billy Franco Date of Service: 12/24/2020 8:45 AM Medical Record Number: [id number] Patient Account Number: [id number] Date of Birth/Sex: Aug 22, 1940 (80 y.o. M) Treating RN: Carlene Coria Primary Care Eliya Geiman: Steele Sizer Other Clinician: Referring Deldrick Linch: Hulan Saas Treating Luca Dyar/Extender: Skipper Cliche in Treatment: 0 Learning Preferences/Education Level/Primary Language Learning Preference: Explanation Highest Education Level: Grade School Preferred Language: English Cognitive Barrier Language Barrier: No Translator Needed: No Memory Deficit: No Emotional Barrier: No Cultural/Religious Beliefs Affecting Medical Care: No Physical Barrier Impaired Vision: No Impaired Hearing: No Decreased Hand dexterity: No Knowledge/Comprehension Knowledge Level: Medium Comprehension Level: High Ability to understand written instructions: Medium Ability to understand verbal instructions: High Motivation Anxiety Level: Calm Cooperation: Cooperative Education Importance: Acknowledges Need Interest in Health Problems: Asks Questions Perception: Coherent Willingness to Engage in Self-Management High Activities: Readiness to Engage in Self-Management High Activities: Electronic Signature(s) Signed: 12/25/2020 10:19:34 AM By: Carlene Coria RN Entered By: Carlene Coria on 12/24/2020 09:10:35 Billy Franco ([id number]) -------------------------------------------------------------------------------- Fall Risk Assessment Details Patient Name: Billy Franco Date of Service: 12/24/2020 8:45 AM Medical Record Number: [id number] Patient Account Number: [id number] Date of Birth/Sex: February 01, 1940 (80 y.o. M) Treating RN: Carlene Coria Primary Care Lainy Wrobleski: Steele Sizer Other  Clinician: Referring Stanley [name]: Hulan Saas Treating Skyylar Kopf/Extender: Skipper Cliche in Treatment: 0 Fall Risk Assessment Items Have you had 2 or more falls in the last 12 monthso 0 No Have you had any fall that resulted in injury in the last 12 monthso 0 No FALLS RISK SCREEN History of falling - immediate or within 3 months 0 No Secondary diagnosis (Do you have 2 or more medical diagnoseso) 0 No Ambulatory aid None/bed rest/wheelchair/nurse 0 No Crutches/cane/walker 0 No Furniture 0 No Intravenous therapy Access/Saline/Heparin Lock 0 No Gait/Transferring Normal/ bed rest/ wheelchair 0 No Weak (short steps with or without shuffle, stooped but able to lift head while walking, may 0  No seek support from furniture) Impaired (short steps with shuffle, may have difficulty arising from chair, head down, impaired 0 No balance) Mental Status Oriented to own ability 0 No Electronic Signature(s) Signed: 12/25/2020 10:19:34 AM By: Carlene Coria RN Entered By: Carlene Coria on 12/24/2020 [place], [place] ([id number]) -------------------------------------------------------------------------------- Foot Assessment Details Patient Name: Billy Franco Date of Service: 12/24/2020 8:45 AM Medical Record Number: [id number] Patient Account Number: [id number] Date of Birth/Sex: 05/26/1940 (80 y.o. M) Treating RN: Carlene Coria Primary Care Keyonna [place]un[place]: Steele Sizer Other Clinician: Referring Haru Shaff: Hulan Saas Treating Laval Cafaro/Extender: Skipper Cliche in Treatment: 0 Foot Assessment Items Site Locations + = Sensation present, - = Sensation absent, C = Callus, U = Ulcer R = Redness, W = Warmth, M = Maceration, PU = Pre-ulcerative lesion F = Fissure, S = Swelling, D = Dryness Assessment Right: Left: Other Deformity: No No Prior Foot Ulcer: No No Prior Amputation: No No Charcot Joint: No No Ambulatory Status: Gait: Electronic Signature(s) Signed: 12/25/2020 10:19:34 AM  By: Carlene Coria RN Entered By: Carlene Coria on 12/24/2020 09:20:03 Billy Franco ([id number]) -------------------------------------------------------------------------------- Nutrition Risk Screening Details Patient Name: Billy Franco Date of Service: 12/24/2020 8:45 AM Medical Record Number: [id number] Patient Account Number: [id number] Date of Birth/Sex: 09-Aug-1940 (80 y.o. M) Treating RN: Carlene Coria Primary Care Lorre Opdahl: Steele Sizer Other Clinician: Referring Nika Yazzie: Hulan Saas Treating Taquanna Borras/Extender: Skipper Cliche in Treatment: 0 Height (in): 67 Weight (lbs): 150 Body Mass Index (BMI): 23.5 Nutrition Risk Screening Items Score Screening NUTRITION RISK SCREEN: I have an illness or condition that made me change the kind and/or amount of food I eat 0 No I eat fewer than two meals per day 0 No I eat few fruits and vegetables, or milk products 0 No I have three or more drinks of beer, liquor or wine almost every day 0 No I have tooth or mouth problems that make it hard for me to eat 0 No I don't always have enough money to buy the food I need 0 No I eat alone most of the time 0 No I take three or more different prescribed or over-the-counter drugs a day 1 Yes Without wanting to, I have lost or gained 10 pounds in the last six months 0 No I am not always physically able to shop, cook and/or feed myself 0 No Nutrition Protocols Good Risk Protocol 0 No interventions needed Moderate Risk Protocol High Risk Proctocol Risk Level: Good Risk Score: 1 Electronic Signature(s) Signed: 12/25/2020 10:19:34 AM By: Carlene Coria RN Entered By: Carlene Coria on 12/24/2020 09:11:31
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
## 2020-12-31 ENCOUNTER — Encounter: Payer: Self-pay | Admitting: Family Medicine

## 2020-12-31 ENCOUNTER — Other Ambulatory Visit: Payer: Self-pay

## 2020-12-31 ENCOUNTER — Ambulatory Visit (INDEPENDENT_AMBULATORY_CARE_PROVIDER_SITE_OTHER): Payer: Medicare Other | Admitting: Family Medicine

## 2020-12-31 VITALS — BP 130/70 | HR 81 | Temp 98.3°F | Resp 16 | Ht 67.0 in | Wt 153.0 lb

## 2020-12-31 DIAGNOSIS — N200 Calculus of kidney: Secondary | ICD-10-CM | POA: Diagnosis not present

## 2020-12-31 DIAGNOSIS — I700 Atherosclerosis of aorta: Secondary | ICD-10-CM

## 2020-12-31 DIAGNOSIS — I5022 Chronic systolic (congestive) heart failure: Secondary | ICD-10-CM | POA: Diagnosis not present

## 2020-12-31 DIAGNOSIS — I129 Hypertensive chronic kidney disease with stage 1 through stage 4 chronic kidney disease, or unspecified chronic kidney disease: Secondary | ICD-10-CM

## 2020-12-31 DIAGNOSIS — S81801D Unspecified open wound, right lower leg, subsequent encounter: Secondary | ICD-10-CM | POA: Diagnosis not present

## 2020-12-31 DIAGNOSIS — E1122 Type 2 diabetes mellitus with diabetic chronic kidney disease: Secondary | ICD-10-CM | POA: Diagnosis not present

## 2020-12-31 DIAGNOSIS — D692 Other nonthrombocytopenic purpura: Secondary | ICD-10-CM | POA: Diagnosis not present

## 2020-12-31 DIAGNOSIS — I4891 Unspecified atrial fibrillation: Secondary | ICD-10-CM | POA: Diagnosis not present

## 2020-12-31 DIAGNOSIS — E1169 Type 2 diabetes mellitus with other specified complication: Secondary | ICD-10-CM

## 2020-12-31 DIAGNOSIS — E441 Mild protein-calorie malnutrition: Secondary | ICD-10-CM

## 2020-12-31 DIAGNOSIS — Z23 Encounter for immunization: Secondary | ICD-10-CM | POA: Diagnosis not present

## 2020-12-31 DIAGNOSIS — N1831 Chronic kidney disease, stage 3a: Secondary | ICD-10-CM

## 2020-12-31 DIAGNOSIS — E785 Hyperlipidemia, unspecified: Secondary | ICD-10-CM | POA: Diagnosis not present

## 2020-12-31 DIAGNOSIS — E039 Hypothyroidism, unspecified: Secondary | ICD-10-CM | POA: Diagnosis not present

## 2020-12-31 LAB — POCT GLYCOSYLATED HEMOGLOBIN (HGB A1C): Hemoglobin A1C: 6.2 % — AB (ref 4.0–5.6)

## 2020-12-31 MED ORDER — METFORMIN HCL ER 500 MG PO TB24: 1000.0000 mg | ORAL_TABLET | Freq: Every day | ORAL | 1 refills | Status: DC

## 2020-12-31 MED ORDER — LANCETS 33G MISC: 1.0000 | Freq: Every day | 1 refills | Status: DC

## 2020-12-31 MED ORDER — ATORVASTATIN CALCIUM 10 MG PO TABS: 10.0000 mg | ORAL_TABLET | Freq: Every day | ORAL | 1 refills | Status: DC

## 2020-12-31 MED ORDER — LEVOTHYROXINE SODIUM 25 MCG PO TABS: 25.0000 ug | ORAL_TABLET | Freq: Every day | ORAL | 1 refills | Status: DC

## 2020-12-31 MED ORDER — ONETOUCH ULTRA VI STRP: ORAL_STRIP | 3 refills | Status: DC

## 2020-12-31 MED ORDER — SHINGRIX 50 MCG/0.5ML IM SUSR: 0.5000 mL | Freq: Once | INTRAMUSCULAR | 1 refills | Status: AC

## 2021-01-01 ENCOUNTER — Encounter: Payer: Medicare Other | Admitting: Physician Assistant

## 2021-01-01 DIAGNOSIS — I5042 Chronic combined systolic (congestive) and diastolic (congestive) heart failure: Secondary | ICD-10-CM | POA: Diagnosis not present

## 2021-01-01 DIAGNOSIS — I110 Hypertensive heart disease with heart failure: Secondary | ICD-10-CM | POA: Diagnosis not present

## 2021-01-01 DIAGNOSIS — L97812 Non-pressure chronic ulcer of other part of right lower leg with fat layer exposed: Secondary | ICD-10-CM | POA: Diagnosis not present

## 2021-01-01 DIAGNOSIS — E11622 Type 2 diabetes mellitus with other skin ulcer: Secondary | ICD-10-CM | POA: Diagnosis not present

## 2021-01-01 DIAGNOSIS — E1151 Type 2 diabetes mellitus with diabetic peripheral angiopathy without gangrene: Secondary | ICD-10-CM | POA: Diagnosis not present

## 2021-01-01 NOTE — Progress Notes (Addendum)
LORAS, GRIESHOP ([id number]) Visit Report for 01/01/2021 Chief Complaint Document Details Patient Name: Billy Franco, Billy Franco Date of Service: 01/01/2021 11:00 AM Medical Record Number: [id number] Patient Account Number: [id number] Date of Birth/Sex: 08/09/1940 (80 y.o. M) Treating RN: Carlene Coria Primary Care Provider: Steele Sizer Other Clinician: Referring Provider: Steele Sizer Treating Provider/Extender: Skipper Cliche in Treatment: 1 Information Obtained from: Patient Chief Complaint Right Leg Dog Bite Electronic Signature(s) Signed: 01/01/2021 10:41:29 AM By: Worthy Keeler PA-C Entered By: Worthy Keeler on 01/01/2021 10:41:29 Billy Franco ([id number]) -------------------------------------------------------------------------------- Debridement Details Patient Name: Billy Franco Date of Service: 01/01/2021 11:00 AM Medical Record Number: [id number] Patient Account Number: [id number] Date of Birth/Sex: 1941/01/10 (80 y.o. M) Treating RN: Carlene Coria Primary Care Provider: Steele Sizer Other Clinician: Referring Provider: Steele Sizer Treating Provider/Extender: Skipper Cliche in Treatment: 1 Debridement Performed for Wound #1 Right,Lateral Lower Leg Assessment: Performed By: Physician Tommie Sams., PA-C Debridement Type: Debridement Severity of Tissue Pre Debridement: Fat layer exposed Level of Consciousness (Pre- Awake and Alert procedure): Pre-procedure Verification/Time Out Yes - 11:55 Taken: Start Time: 11:55 Pain Control: Lidocaine 4% Topical Solution Total Area Debrided (L x W): 1.4 (cm) x 0.8 (cm) = 1.12 (cm) Tissue and other material Viable, Non-Viable, Slough, Subcutaneous, Biofilm, Slough debrided: Level: Skin/Subcutaneous Tissue Debridement Description: Excisional Instrument: Curette Bleeding: Minimum Hemostasis Achieved: Pressure End Time: 11:59 Procedural Pain: 0 Post Procedural Pain: 0 Response to Treatment: Procedure was  tolerated well Level of Consciousness (Post- Awake and Alert procedure): Post Debridement Measurements of Total Wound Length: (cm) 1.4 Width: (cm) 0.8 Depth: (cm) 0.4 Volume: (cm) 0.352 Character of Wound/Ulcer Post Debridement: Improved Severity of Tissue Post Debridement: Fat layer exposed Post Procedure Diagnosis Same as Pre-procedure Electronic Signature(s) Signed: 01/01/2021 5:37:18 PM By: Worthy Keeler PA-C Signed: 01/02/2021 4:05:54 PM By: Carlene Coria RN Entered By: Carlene Coria on 01/01/2021 11:59:48 Billy Franco ([id number]) -------------------------------------------------------------------------------- HPI Details Patient Name: Billy Franco Date of Service: 01/01/2021 11:00 AM Medical Record Number: [id number] Patient Account Number: [id number] Date of Birth/Sex: 07/26/1940 (80 y.o. M) Treating RN: Carlene Coria Primary Care Provider: Steele Sizer Other Clinician: Referring Provider: Steele Sizer Treating Provider/Extender: Skipper Cliche in Treatment: 1 History of Present Illness HPI Description: 12/24/2020 upon evaluation today patient appears to be doing somewhat poorly in regard to the wound that actually occurred around November 13 2020. This was actually a dog bite he is unsure of what type of dog it was it was a small wound that wound was not a familiar dog to him. Subsequently he ended up being seen by his primary care provider in October 28 by his primary care provider. Subsequently they originally placed him on 2 rounds of amoxicillin he is also been on doxycycline. Unfortunately this just does not seem to be getting better despite the treatment up to this point they have been initially cleaning with alcohol although that has been discontinued when he went to the ER on November 14. They actually recommended cleaning with just soap and water. There is really not been any specific dressings that were recommended at this time. Subsequently the  patient does have an ABI of 1.3 here in the clinic today. This is on the right. Subsequently also has a recent hemoglobin A1c from when he was at the ER of 6.3. He really is not having any significant pain which is good news he does have some swelling which is going to need to be addressed as well. This is minimal however I do not  think he has to have a compression wrap I think Tubigrip would probably be appropriate. Patient does have a history of diabetes mellitus type 2, congestive heart failure, atrial fibrillation, and hypertension. 01/01/2021 upon evaluation today patient appears to be doing pretty well in regard to his wound. Fortunately I do not see any signs of infection and in 1 week's time I feel like we have made some good progress here. No fevers, chills, nausea, vomiting, or diarrhea. Electronic Signature(s) Signed: 01/01/2021 5:10:56 PM By: Worthy Keeler PA-C Entered By: Worthy Keeler on 01/01/2021 17:10:56 Billy Franco ([id number]) -------------------------------------------------------------------------------- Physical Exam Details Patient Name: Billy Franco Date of Service: 01/01/2021 11:00 AM Medical Record Number: [id number] Patient Account Number: [id number] Date of Birth/Sex: 08-29-1940 (80 y.o. M) Treating RN: Carlene Coria Primary Care Provider: Steele Sizer Other Clinician: Referring Provider: Steele Sizer Treating Provider/Extender: Skipper Cliche in Treatment: 1 Constitutional Well-nourished and well-hydrated in no acute distress. Respiratory normal breathing without difficulty. Psychiatric this patient is able to make decisions and demonstrates good insight into disease process. Alert and Oriented x 3. pleasant and cooperative. Notes Upon inspection patient's wound bed did require some debridement to clear away some of the necrotic debris he tolerated that without complication and postdebridement wound bed appears to be doing much better which  is great news. Electronic Signature(s) Signed: 01/01/2021 5:11:30 PM By: Worthy Keeler PA-C Entered By: Worthy Keeler on 01/01/2021 17:11:30 Billy Franco ([id number]) -------------------------------------------------------------------------------- Physician Orders Details Patient Name: Billy Franco Date of Service: 01/01/2021 11:00 AM Medical Record Number: [id number] Patient Account Number: [id number] Date of Birth/Sex: 26-Mar-1940 (80 y.o. M) Treating RN: Carlene Coria Primary Care Provider: Steele Sizer Other Clinician: Referring Provider: Steele Sizer Treating Provider/Extender: Skipper Cliche in Treatment: 1 Verbal / Phone Orders: No Diagnosis Coding ICD-10 Coding Code Description W54.0XXA Bitten by dog, initial encounter [phone] Non-pressure chronic ulcer of other part of right lower leg with fat layer exposed E11.622 Type 2 diabetes mellitus with other skin ulcer I50.42 Chronic combined systolic (congestive) and diastolic (congestive) heart failure I48.0 Paroxysmal atrial fibrillation I10 Essential (primary) hypertension Follow-up Appointments o Return Appointment in 1 week. Bathing/ Shower/ Hygiene o May shower; gently cleanse wound with antibacterial soap, rinse and pat dry prior to dressing wounds - may shower on days dressing changed, remove dressing, shower, reapply dressing Edema Control - Lymphedema / Segmental Compressive Device / Other o Tubigrip single layer applied. - side c o Elevate, Exercise Daily and Avoid Standing for Long Periods of Time. o Elevate legs to the level of the heart and pump ankles as often as possible o Elevate leg(s) parallel to the floor when sitting. Wound Treatment Wound #1 - Lower Leg Wound Laterality: Right, Lateral Cleanser: Byram Ancillary Kit - 15 Day Supply (Generic) 3 x Per Week/30 Days Discharge Instructions: Use supplies as instructed; Kit contains: (15) Saline Bullets; (15) 3x3 Gauze; 15 pr  Gloves Cleanser: Soap and Water 3 x Per Week/30 Days Discharge Instructions: Gently cleanse wound with antibacterial soap, rinse and pat dry prior to dressing wounds Primary Dressing: Prisma 4.34 (in) (Generic) 3 x Per Week/30 Days Discharge Instructions: Moisten w/normal saline or sterile water; Cover wound as directed. Do not remove from wound bed. Secondary Dressing: ABD Pad 5x9 (in/in) (Generic) 3 x Per Week/30 Days Discharge Instructions: Cover with ABD pad Secured With: 35M Medipore H Soft Cloth Surgical Tape, 2x2 (in/yd) (Generic) 3 x Per Week/30 Days Compression Wrap: tubi grip 3 x Per Week/30 Days Discharge Instructions: size c  Electronic Signature(s) Signed: 01/01/2021 5:37:18 PM By: Worthy Keeler PA-C Signed: 01/02/2021 4:05:54 PM By: Carlene Coria RN Entered By: Carlene Coria on 01/01/2021 11:56:17 Billy Franco ([id number]) -------------------------------------------------------------------------------- Problem List Details Patient Name: Billy Franco Date of Service: 01/01/2021 11:00 AM Medical Record Number: [id number] Patient Account Number: [id number] Date of Birth/Sex: 1940-08-13 (80 y.o. M) Treating RN: Carlene Coria Primary Care Provider: Steele Sizer Other Clinician: Referring Provider: Steele Sizer Treating Provider/Extender: Skipper Cliche in Treatment: 1 Active Problems ICD-10 Encounter Code Description Active Date MDM Diagnosis W54.0XXA Bitten by dog, initial encounter 12/24/2020 No Yes L97.812 Non-pressure chronic ulcer of other part of right lower leg with fat layer 12/24/2020 No Yes exposed E11.622 Type 2 diabetes mellitus with other skin ulcer 12/24/2020 No Yes I50.42 Chronic combined systolic (congestive) and diastolic (congestive) heart 12/24/2020 No Yes failure I48.0 Paroxysmal atrial fibrillation 12/24/2020 No Yes I10 Essential (primary) hypertension 12/24/2020 No Yes Inactive Problems Resolved Problems Electronic Signature(s) Signed:  01/01/2021 10:41:22 AM By: Worthy Keeler PA-C Entered By: Worthy Keeler on 01/01/2021 10:41:22 Billy Franco ([id number]) -------------------------------------------------------------------------------- Progress Note Details Patient Name: Billy Franco Date of Service: 01/01/2021 11:00 AM Medical Record Number: [id number] Patient Account Number: [id number] Date of Birth/Sex: 06-Jul-1940 (80 y.o. M) Treating RN: Carlene Coria Primary Care Provider: Steele Sizer Other Clinician: Referring Provider: Steele Sizer Treating Provider/Extender: Skipper Cliche in Treatment: 1 Subjective Chief Complaint Information obtained from Patient Right Leg Dog Bite History of Present Illness (HPI) 12/24/2020 upon evaluation today patient appears to be doing somewhat poorly in regard to the wound that actually occurred around November 13 2020. This was actually a dog bite he is unsure of what type of dog it was it was a small wound that wound was not a familiar dog to him. Subsequently he ended up being seen by his primary care provider in October 28 by his primary care provider. Subsequently they originally placed him on 2 rounds of amoxicillin he is also been on doxycycline. Unfortunately this just does not seem to be getting better despite the treatment up to this point they have been initially cleaning with alcohol although that has been discontinued when he went to the ER on November 14. They actually recommended cleaning with just soap and water. There is really not been any specific dressings that were recommended at this time. Subsequently the patient does have an ABI of 1.3 here in the clinic today. This is on the right. Subsequently also has a recent hemoglobin A1c from when he was at the ER of 6.3. He really is not having any significant pain which is good news he does have some swelling which is going to need to be addressed as well. This is minimal however I do not think he has to have a  compression wrap I think Tubigrip would probably be appropriate. Patient does have a history of diabetes mellitus type 2, congestive heart failure, atrial fibrillation, and hypertension. 01/01/2021 upon evaluation today patient appears to be doing pretty well in regard to his wound. Fortunately I do not see any signs of infection and in 1 week's time I feel like we have made some good progress here. No fevers, chills, nausea, vomiting, or diarrhea. Objective Constitutional Well-nourished and well-hydrated in no acute distress. Vitals Time Taken: 11:29 AM, Height: 67 in, Weight: 150 lbs, BMI: 23.5, Temperature: 98.1 F, Pulse: 67 bpm, Respiratory Rate: 18 breaths/min, Blood Pressure: 119/72 mmHg. Respiratory normal breathing without difficulty. Psychiatric this patient is able to make decisions and  demonstrates good insight into disease process. Alert and Oriented x 3. pleasant and cooperative. General Notes: Upon inspection patient's wound bed did require some debridement to clear away some of the necrotic debris he tolerated that without complication and postdebridement wound bed appears to be doing much better which is great news. Integumentary (Hair, Skin) Wound #1 status is Open. Original cause of wound was Bite. The date acquired was: 11/16/2020. The wound has been in treatment 1 weeks. The wound is located on the Right,Lateral Lower Leg. The wound measures 1.4cm length x 0.8cm width x 0.4cm depth; 0.88cm^2 area and 0.352cm^3 volume. There is no tunneling or undermining noted. There is a medium amount of serosanguineous drainage noted. There is small (1-33%) red granulation within the wound bed. There is a large (67-100%) amount of necrotic tissue within the wound bed including Adherent Slough. Assessment Active Problems DAYMIEN, GOTH ([id number]) ICD-10 Bitten by dog, initial encounter Non-pressure chronic ulcer of other part of right lower leg with fat layer exposed Type 2  diabetes mellitus with other skin ulcer Chronic combined systolic (congestive) and diastolic (congestive) heart failure Paroxysmal atrial fibrillation Essential (primary) hypertension Procedures Wound #1 Pre-procedure diagnosis of Wound #1 is a Diabetic Wound/Ulcer of the Lower Extremity located on the Right,Lateral Lower Leg .Severity of Tissue Pre Debridement is: Fat layer exposed. There was a Excisional Skin/Subcutaneous Tissue Debridement with a total area of 1.12 sq cm performed by Tommie Sams., PA-C. With the following instrument(s): Curette to remove Viable and Non-Viable tissue/material. Material removed includes Subcutaneous Tissue, Slough, and Biofilm after achieving pain control using Lidocaine 4% Topical Solution. No specimens were taken. A time out was conducted at 11:55, prior to the start of the procedure. A Minimum amount of bleeding was controlled with Pressure. The procedure was tolerated well with a pain level of 0 throughout and a pain level of 0 following the procedure. Post Debridement Measurements: 1.4cm length x 0.8cm width x 0.4cm depth; 0.352cm^3 volume. Character of Wound/Ulcer Post Debridement is improved. Severity of Tissue Post Debridement is: Fat layer exposed. Post procedure Diagnosis Wound #1: Same as Pre-Procedure Plan Follow-up Appointments: Return Appointment in 1 week. Bathing/ Shower/ Hygiene: May shower; gently cleanse wound with antibacterial soap, rinse and pat dry prior to dressing wounds - may shower on days dressing changed, remove dressing, shower, reapply dressing Edema Control - Lymphedema / Segmental Compressive Device / Other: Tubigrip single layer applied. - side c Elevate, Exercise Daily and Avoid Standing for Long Periods of Time. Elevate legs to the level of the heart and pump ankles as often as possible Elevate leg(s) parallel to the floor when sitting. WOUND #1: - Lower Leg Wound Laterality: Right, Lateral Cleanser: Byram Ancillary  Kit - 15 Day Supply (Generic) 3 x Per Week/30 Days Discharge Instructions: Use supplies as instructed; Kit contains: (15) Saline Bullets; (15) 3x3 Gauze; 15 pr Gloves Cleanser: Soap and Water 3 x Per Week/30 Days Discharge Instructions: Gently cleanse wound with antibacterial soap, rinse and pat dry prior to dressing wounds Primary Dressing: Prisma 4.34 (in) (Generic) 3 x Per Week/30 Days Discharge Instructions: Moisten w/normal saline or sterile water; Cover wound as directed. Do not remove from wound bed. Secondary Dressing: ABD Pad 5x9 (in/in) (Generic) 3 x Per Week/30 Days Discharge Instructions: Cover with ABD pad Secured With: 53M Medipore H Soft Cloth Surgical Tape, 2x2 (in/yd) (Generic) 3 x Per Week/30 Days Compression Wrap: tubi grip 3 x Per Week/30 Days Discharge Instructions: size c 1. I would recommend currently that  we actually continue with the silver collagen which I think is still probably can be the best way to go his wife is taking excellent care of him. 2. I am also can recommend that we have the patient continue with the ABD pad to cover followed by roll gauze to secure in place with Tubigrip. We will see patient back for reevaluation in 1 week here in the clinic. If anything worsens or changes patient will contact our office for additional recommendations. Electronic Signature(s) Signed: 01/01/2021 5:11:57 PM By: Worthy Keeler PA-C Entered By: Worthy Keeler on 01/01/2021 17:11:57 Billy Franco ([id number]) -------------------------------------------------------------------------------- SuperBill Details Patient Name: Billy Franco Date of Service: 01/01/2021 Medical Record Number: [id number] Patient Account Number: [id number] Date of Birth/Sex: [birth year] (80 y.o. M) Treating RN: Carlene Coria Primary Care Provider: Steele Sizer Other Clinician: Referring Provider: Steele Sizer Treating Provider/Extender: Skipper Cliche in Treatment: 1 Diagnosis  Coding ICD-10 Codes Code Description [phone] Bitten by dog, initial encounter [phone] Non-pressure chronic ulcer of other part of right lower leg with fat layer exposed E11.622 Type 2 diabetes mellitus with other skin ulcer I50.42 Chronic combined systolic (congestive) and diastolic (congestive) heart failure I48.0 Paroxysmal atrial fibrillation I10 Essential (primary) hypertension Facility Procedures CPT4 Code: 70721711 Description: 65461 - DEB SUBQ TISSUE 20 SQ CM/< Modifier: Quantity: 1 CPT4 Code: Description: ICD-10 Diagnosis Description K43.275 Non-pressure chronic ulcer of other part of right lower leg with fat lay Modifier: er exposed Quantity: Physician Procedures CPT4 Code: 5623921 Description: 11042 - WC PHYS SUBQ TISS 20 SQ CM Modifier: Quantity: 1 CPT4 Code: Description: ICD-10 Diagnosis Description L15.826 Non-pressure chronic ulcer of other part of right lower leg with fat lay Modifier: er exposed Quantity: Electronic Signature(s) Signed: 01/01/2021 5:12:40 PM By: Worthy Keeler PA-C Previous Signature: 01/01/2021 3:47:58 PM Version By: Carlene Coria RN Entered By: Worthy Keeler on 01/01/2021 17:12:40

## 2021-01-01 NOTE — Progress Notes (Addendum)
Billy Franco, Billy Franco ([id number]) Visit Report for 01/01/2021 Arrival Information Details Patient Name: Billy Franco Date of Service: 01/01/2021 11:00 AM Medical Record Number: [id number] Patient Account Number: [id number] Date of Birth/Sex: 01-19-1941 (80 y.o. M) Treating RN: Carlene Coria Primary Care Tolbert Matheson: Steele Sizer Other Clinician: Referring Shanaiya Bene: Steele Sizer Treating Nevaen Tredway/Extender: Skipper Cliche in Treatment: 1 Visit Information History Since Last Visit All ordered tests and consults were completed: No Patient Arrived: Ambulatory Added or deleted any medications: No Arrival Time: 11:25 Any new allergies or adverse reactions: No Accompanied By: wife Had a fall or experienced change in No Transfer Assistance: None activities of daily living that may affect Patient Identification Verified: Yes risk of falls: Secondary Verification Process Completed: Yes Signs or symptoms of abuse/neglect since last visito No Patient Requires Transmission-Based No Hospitalized since last visit: No Precautions: Implantable device outside of the clinic excluding No Patient Has Alerts: Yes cellular tissue based products placed in the center Patient Alerts: Patient on Blood since last visit: Thinner Has Dressing in Place as Prescribed: Yes Has Compression in Place as Prescribed: Yes Pain Present Now: No Electronic Signature(s) Signed: 01/02/2021 4:05:54 PM By: Carlene Coria RN Entered By: Carlene Coria on 01/01/2021 11:29:13 Billy Franco ([id number]) -------------------------------------------------------------------------------- Clinic Level of Care Assessment Details Patient Name: Billy Franco Date of Service: 01/01/2021 11:00 AM Medical Record Number: [id number] Patient Account Number: [id number] Date of Birth/Sex: 09/13/1940 (80 y.o. M) Treating RN: Carlene Coria Primary Care Shye Doty: Steele Sizer Other Clinician: Referring Cammeron Greis: Steele Sizer Treating  Lanier Felty/Extender: Skipper Cliche in Treatment: 1 Clinic Level of Care Assessment Items TOOL 1 Quantity Score _0  - Use when EandM and Procedure is performed on INITIAL visit 0 ASSESSMENTS - Nursing Assessment / Reassessment _1  - General Physical Exam (combine w/ comprehensive assessment (listed just below) when performed on new 0 pt. evals) _2  - 0 Comprehensive Assessment (HX, ROS, Risk Assessments, Wounds Hx, etc.) ASSESSMENTS - Wound and Skin Assessment / Reassessment _3  - Dermatologic / Skin Assessment (not related to wound area) 0 ASSESSMENTS - Ostomy and/or Continence Assessment and Care _4  - Incontinence Assessment and Management 0 _5  - 0 Ostomy Care Assessment and Management (repouching, etc.) PROCESS - Coordination of Care _6  - Simple Patient / Family Education for ongoing care 0 _7  - 0 Complex (extensive) Patient / Family Education for ongoing care _8  - 0 Staff obtains Programmer, systems, Records, Test Results / Process Orders _9  - 0 Staff telephones HHA, Nursing Homes / Clarify orders / etc _10  - 0 Routine Transfer to another Facility (non-emergent condition) _11  - 0 Routine Hospital Admission (non-emergent condition) _12  - 0 New Admissions / Biomedical engineer / Ordering NPWT, Apligraf, etc. _13  - 0 Emergency Hospital Admission (emergent condition) PROCESS - Special Needs _14  - Pediatric / Minor Patient Management 0 _15  - 0 Isolation Patient Management _16  - 0 Hearing / Language / Visual special needs _17  - 0 Assessment of Community assistance (transportation, D/C planning, etc.) _18  - 0 Additional assistance / Altered mentation _19  - 0 Support Surface(s) Assessment (bed, cushion, seat, etc.) INTERVENTIONS - Miscellaneous _20  - External ear exam 0 _21  - 0 Patient Transfer (multiple staff / Civil Service fast streamer / Similar devices) _22  - 0 Simple Staple / Suture removal (25 or less) _23  - 0 Complex Staple / Suture removal (26 or more) _24  - 0 Hypo/Hyperglycemic Management (do not  check if billed separately) _25  - 0 Ankle / Brachial Index (ABI) - do not check if billed separately Has the patient been seen at the hospital within  the last three years: Yes Total Score: 0 Level Of Care: ____ Billy Franco ([id number]) Electronic Signature(s) Signed: 01/02/2021 4:05:54 PM By: Carlene Coria RN Entered By: Carlene Coria on 01/01/2021 12:33:13 Billy Franco ([id number]) -------------------------------------------------------------------------------- Encounter Discharge Information Details Patient Name: Billy Franco Date of Service: 01/01/2021 11:00 AM Medical Record Number: [id number] Patient Account Number: [id number] Date of Birth/Sex: 1940/02/23 (80 y.o. M) Treating RN: Carlene Coria Primary Care Ahleah Simko: Steele Sizer Other Clinician: Referring Jayleena Stille: Steele Sizer Treating Rada Zegers/Extender: Skipper Cliche in Treatment: 1 Encounter Discharge Information Items Post Procedure Vitals Discharge Condition: Stable Temperature (F): 98.1 Ambulatory Status: Ambulatory Pulse (bpm): 67 Discharge Destination: Home Respiratory Rate (breaths/min): 18 Transportation: Private Auto Blood Pressure (mmHg): 119/72 Accompanied By: wife Schedule Follow-up Appointment: Yes Clinical Summary of Care: Patient Declined Electronic Signature(s) Signed: 01/01/2021 12:34:55 PM By: Carlene Coria RN Entered By: Carlene Coria on 01/01/2021 12:34:55 Billy Franco ([id number]) -------------------------------------------------------------------------------- Lower Extremity Assessment Details Patient Name: Billy Franco Date of Service: 01/01/2021 11:00 AM Medical Record Number: [id number] Patient Account Number: [id number] Date of Birth/Sex: 02-19-1940 (80 y.o. M) Treating RN: Carlene Coria Primary Care Laakea Pereira: Steele Sizer Other Clinician: Referring Laney Louderback: Steele Sizer Treating [name]/Extender: Skipper Cliche in Treatment: 1 Edema Assessment Assessed:  [Left: No] [Right: No] Edema: [Left: Ye] [Right: s] Calf Left: Right: Point of Measurement: 36 cm From Medial Instep 31 cm Ankle Left: Right: Point of Measurement: 10 cm From Medial Instep 22 cm Vascular Assessment Pulses: Dorsalis Pedis Palpable: [Right:Yes] Electronic Signature(s) Signed: 01/02/2021 4:05:54 PM By: Carlene Coria RN Entered By: Carlene Coria on 01/01/2021 11:37:15 Billy Franco ([id number]) -------------------------------------------------------------------------------- Multi Wound Chart Details Patient Name: Billy Franco Date of Service: 01/01/2021 11:00 AM Medical Record Number: [id number] Patient Account Number: [id number] Date of Birth/Sex: Jun 07, 1940 (80 y.o. M) Treating RN: Carlene Coria Primary Care Leilanie Rauda: Steele Sizer Other Clinician: Referring Laydon Martis: Steele Sizer Treating Anayia Eugene/Extender: Skipper Cliche in Treatment: 1 Vital Signs Height(in): 67 Pulse(bpm): 36 Weight(lbs): 150 Blood Pressure(mmHg): 119/72 Body Mass Index(BMI): 23 Temperature(F): 98.1 Respiratory Rate(breaths/min): 18 Photos: [N/A:N/A] Wound Location: Right, Lateral Lower Leg N/A N/A Wounding Event: Bite N/A N/A Primary Etiology: Diabetic Wound/Ulcer of the Lower N/A N/A Extremity Comorbid History: Congestive Heart Failure, N/A N/A Hypertension, Type II Diabetes Date Acquired: 11/16/2020 N/A N/A Weeks of Treatment: 1 N/A N/A Wound Status: Open N/A N/A Measurements L x W x D (cm) 1.4x0.8x0.4 N/A N/A Area (cm) : 0.88 N/A N/A Volume (cm) : 0.352 N/A N/A % Reduction in Area: 53.30% N/A N/A % Reduction in Volume: 62.60% N/A N/A Classification: Grade 2 N/A N/A Exudate Amount: Medium N/A N/A Exudate Type: Serosanguineous N/A N/A Exudate Color: red, brown N/A N/A Granulation Amount: Small (1-33%) N/A N/A Granulation Quality: Red N/A N/A Necrotic Amount: Large (67-100%) N/A N/A Exposed Structures: Fascia: No N/A N/A Fat Layer (Subcutaneous  Tissue): No Tendon: No Muscle: No Joint: No Bone: No Epithelialization: None N/A N/A Treatment Notes Electronic Signature(s) Signed: 01/02/2021 4:05:54 PM By: Carlene Coria RN Entered By: Carlene Coria on 01/01/2021 11:55:38 Billy Franco ([id number]) -------------------------------------------------------------------------------- Multi-Disciplinary Care Plan Details Patient Name: Billy Franco Date of Service: 01/01/2021 11:00 AM Medical Record Number: [id number] Patient Account Number: [id number] Date of Birth/Sex: 27-Mar-1940 (80 y.o. M) Treating RN: Carlene Coria Primary Care Brighid Koch: Steele Sizer Other Clinician: Referring Toini Failla: Steele Sizer Treating Parthenia Tellefsen/Extender: Skipper Cliche in Treatment: 1 Active Inactive Wound/Skin Impairment Nursing Diagnoses: Knowledge deficit related to ulceration/compromised skin integrity Goals: Patient/caregiver will verbalize understanding of skin care regimen Date Initiated: 12/24/2020 Target Resolution Date: 01/24/2021  Goal Status: Active Ulcer/skin breakdown will have a volume reduction of 30% by week 4 Date Initiated: 12/24/2020 Target Resolution Date: 02/24/2021 Goal Status: Active Ulcer/skin breakdown will have a volume reduction of 50% by week 8 Date Initiated: 12/24/2020 Target Resolution Date: 03/24/2021 Goal Status: Active Ulcer/skin breakdown will have a volume reduction of 80% by week 12 Date Initiated: 12/24/2020 Target Resolution Date: 04/24/2021 Goal Status: Active Ulcer/skin breakdown will heal within 14 weeks Date Initiated: 12/24/2020 Target Resolution Date: 05/24/2021 Goal Status: Active Interventions: Assess patient/caregiver ability to obtain necessary supplies Assess patient/caregiver ability to perform ulcer/skin care regimen upon admission and as needed Assess ulceration(s) every visit Notes: Electronic Signature(s) Signed: 01/02/2021 4:05:54 PM By: Carlene Coria RN Entered By: Carlene Coria on 01/01/2021  11:55:28 Billy Franco ([id number]) -------------------------------------------------------------------------------- Pain Assessment Details Patient Name: Billy Franco Date of Service: 01/01/2021 11:00 AM Medical Record Number: [id number] Patient Account Number: [id number] Date of Birth/Sex: [birth year] (80 y.o. M) Treating RN: Carlene Coria Primary Care Delvis Kau: Steele Sizer Other Clinician: Referring Dylyn Mclaren: Steele Sizer Treating Emelyn Roen/Extender: Skipper Cliche in Treatment: 1 Active Problems Location of Pain Severity and Description of Pain Patient Has Paino No Site Locations Pain Management and Medication Current Pain Management: Electronic Signature(s) Signed: 01/02/2021 4:05:54 PM By: Carlene Coria RN Entered By: Carlene Coria on 01/01/2021 11:31:05 Billy Franco ([id number]) -------------------------------------------------------------------------------- Patient/Caregiver Education Details Patient Name: Billy Franco Date of Service: 01/01/2021 11:00 AM Medical Record Number: [id number] Patient Account Number: [id number] Date of Birth/Gender: 1940-02-23 (80 y.o. M) Treating RN: Carlene Coria Primary Care Physician: Steele Sizer Other Clinician: Referring Physician: Steele Sizer Treating Physician/Extender: Skipper Cliche in Treatment: 1 Education Assessment Education Provided To: Patient Education Topics Provided Wound/Skin Impairment: Methods: Explain/Verbal Responses: State content correctly Electronic Signature(s) Signed: 01/02/2021 4:05:54 PM By: Carlene Coria RN Entered By: Carlene Coria on 01/01/2021 12:33:29 Billy Franco ([id number]) -------------------------------------------------------------------------------- Wound Assessment Details Patient Name: Billy Franco Date of Service: 01/01/2021 11:00 AM Medical Record Number: [id number] Patient Account Number: [id number] Date of Birth/Sex: 1940-07-09 (80 y.o. M) Treating RN: Carlene Coria Primary Care Garrus Gauthreaux: Steele Sizer Other Clinician: Referring Renalda Locklin: Steele Sizer Treating Robbyn Hodkinson/Extender: Skipper Cliche in Treatment: 1 Wound Status Wound Number: 1 Primary Etiology: Diabetic Wound/Ulcer of the Lower Extremity Wound Location: Right, Lateral Lower Leg Wound Status: Open Wounding Event: Bite Comorbid Congestive Heart Failure, Hypertension, Type II History: Diabetes Date Acquired: 11/16/2020 Weeks Of Treatment: 1 Clustered Wound: No Photos Wound Measurements Length: (cm) 1.4 Width: (cm) 0.8 Depth: (cm) 0.4 Area: (cm) 0.88 Volume: (cm) 0.352 % Reduction in Area: 53.3% % Reduction in Volume: 62.6% Epithelialization: None Tunneling: No Undermining: No Wound Description Classification: Grade 2 Exudate Amount: Medium Exudate Type: Serosanguineous Exudate Color: red, brown Foul Odor After Cleansing: No Slough/Fibrino Yes Wound Bed Granulation Amount: Small (1-33%) Exposed Structure Granulation Quality: Red Fascia Exposed: No Necrotic Amount: Large (67-100%) Fat Layer (Subcutaneous Tissue) Exposed: No Necrotic Quality: Adherent Slough Tendon Exposed: No Muscle Exposed: No Joint Exposed: No Bone Exposed: No Treatment Notes Wound #1 (Lower Leg) Wound Laterality: Right, Lateral Cleanser [place]ram Ancillary Kit - 15 Day Supply Discharge Instruction: Use supplies as instructed; Kit contains: (15) Saline Bullets; (15) 3x3 Gauze; 15 pr Gloves Soap and Water Discharge Instruction: Gently cleanse wound with antibacterial soap, rinse and pat dry prior to dressing wounds Hoak, Preet ([id number]) Peri-Wound Care Topical Primary Dressing Prisma 4.34 (in) Discharge Instruction: Moisten w/normal saline or sterile water; Cover wound as directed. Do not remove from wound bed. Secondary Dressing ABD Pad 5x9 (in/in) Discharge  Instruction: Cover with ABD pad Secured With 4M Medipore H Soft Cloth Surgical Tape, 2x2 (in/yd) Compression  Wrap tubi grip Discharge Instruction: size c Compression Stockings Add-Ons Electronic Signature(s) Signed: 01/02/2021 4:05:54 PM By: Carlene Coria RN Entered By: Carlene Coria on 01/01/2021 11:36:28 Billy Franco ([id number]) -------------------------------------------------------------------------------- Vitals Details Patient Name: Billy Franco Date of Service: 01/01/2021 11:00 AM Medical Record Number: [id number] Patient Account Number: [id number] Date of Birth/Sex: 02/11/1940 (80 y.o. M) Treating RN: Carlene Coria Primary Care Hailee Hollick: Steele Sizer Other Clinician: Referring Nyx Keady: Steele Sizer Treating Prerana Strayer/Extender: Skipper Cliche in Treatment: 1 Vital Signs Time Taken: 11:29 Temperature (F): 98.1 Height (in): 67 Pulse (bpm): 67 Weight (lbs): 150 Respiratory Rate (breaths/min): 18 Body Mass Index (BMI): 23.5 Blood Pressure (mmHg): 119/72 Reference Range: 80 - 120 mg / dl Electronic Signature(s) Signed: 01/02/2021 4:05:54 PM By: Carlene Coria RN Entered By: Carlene Coria on 01/01/2021 11:29:54

## 2021-01-02 DIAGNOSIS — N200 Calculus of kidney: Secondary | ICD-10-CM | POA: Diagnosis not present

## 2021-01-02 DIAGNOSIS — N401 Enlarged prostate with lower urinary tract symptoms: Secondary | ICD-10-CM | POA: Diagnosis not present

## 2021-01-07 DIAGNOSIS — L821 Other seborrheic keratosis: Secondary | ICD-10-CM | POA: Diagnosis not present

## 2021-01-07 DIAGNOSIS — Z85828 Personal history of other malignant neoplasm of skin: Secondary | ICD-10-CM | POA: Diagnosis not present

## 2021-01-07 DIAGNOSIS — D485 Neoplasm of uncertain behavior of skin: Secondary | ICD-10-CM | POA: Diagnosis not present

## 2021-01-07 DIAGNOSIS — L570 Actinic keratosis: Secondary | ICD-10-CM | POA: Diagnosis not present

## 2021-01-07 DIAGNOSIS — D2262 Melanocytic nevi of left upper limb, including shoulder: Secondary | ICD-10-CM | POA: Diagnosis not present

## 2021-01-07 DIAGNOSIS — D225 Melanocytic nevi of trunk: Secondary | ICD-10-CM | POA: Diagnosis not present

## 2021-01-07 DIAGNOSIS — D2261 Melanocytic nevi of right upper limb, including shoulder: Secondary | ICD-10-CM | POA: Diagnosis not present

## 2021-01-07 DIAGNOSIS — D0462 Carcinoma in situ of skin of left upper limb, including shoulder: Secondary | ICD-10-CM | POA: Diagnosis not present

## 2021-01-07 DIAGNOSIS — X32XXXA Exposure to sunlight, initial encounter: Secondary | ICD-10-CM | POA: Diagnosis not present

## 2021-01-08 ENCOUNTER — Other Ambulatory Visit: Payer: Self-pay

## 2021-01-08 ENCOUNTER — Ambulatory Visit: Payer: Medicare Other | Admitting: Family Medicine

## 2021-01-08 ENCOUNTER — Encounter: Payer: Medicare Other | Admitting: Physician Assistant

## 2021-01-08 DIAGNOSIS — I110 Hypertensive heart disease with heart failure: Secondary | ICD-10-CM | POA: Diagnosis not present

## 2021-01-08 DIAGNOSIS — E11622 Type 2 diabetes mellitus with other skin ulcer: Secondary | ICD-10-CM | POA: Diagnosis not present

## 2021-01-08 DIAGNOSIS — I5042 Chronic combined systolic (congestive) and diastolic (congestive) heart failure: Secondary | ICD-10-CM | POA: Diagnosis not present

## 2021-01-08 DIAGNOSIS — L97812 Non-pressure chronic ulcer of other part of right lower leg with fat layer exposed: Secondary | ICD-10-CM | POA: Diagnosis not present

## 2021-01-08 DIAGNOSIS — E1151 Type 2 diabetes mellitus with diabetic peripheral angiopathy without gangrene: Secondary | ICD-10-CM | POA: Diagnosis not present

## 2021-01-08 NOTE — Progress Notes (Addendum)
Billy Franco, Billy Franco ([id number]) Visit Report for 01/08/2021 Chief Complaint Document Details Patient Name: Billy Franco Date of Service: 01/08/2021 11:00 AM Medical Record Number: [id number] Patient Account Number: [id number] Date of Birth/Sex: 02/18/1940 (80 y.o. M) Treating RN: Carlene Coria Primary Care Provider: Steele Sizer Other Clinician: Referring Provider: Steele Sizer Treating Provider/Extender: Skipper Cliche in Treatment: 2 Information Obtained from: Patient Chief Complaint Right Leg Dog Bite Electronic Signature(s) Signed: 01/08/2021 11:40:04 AM By: Worthy Keeler PA-C Entered By: Worthy Keeler on 01/08/2021 11:40:04 Billy Franco ([id number]) -------------------------------------------------------------------------------- Debridement Details Patient Name: Billy Franco Date of Service: 01/08/2021 11:00 AM Medical Record Number: [id number] Patient Account Number: [id number] Date of Birth/Sex: 1940-08-29 (80 y.o. M) Treating RN: Carlene Coria Primary Care Provider: Steele Sizer Other Clinician: Referring Provider: Steele Sizer Treating Provider/Extender: Skipper Cliche in Treatment: 2 Debridement Performed for Wound #1 Right,Lateral Lower Leg Assessment: Performed By: Physician Tommie Sams., PA-C Debridement Type: Debridement Severity of Tissue Pre Debridement: Fat layer exposed Level of Consciousness (Pre- Awake and Alert procedure): Pre-procedure Verification/Time Out Yes - 11:40 Taken: Start Time: 11:40 Pain Control: Lidocaine 4% Topical Solution Total Area Debrided (L x W): 2 (cm) x 0.8 (cm) = 1.6 (cm) Tissue and other material Viable, Non-Viable, Slough, Subcutaneous, Skin: Dermis , Skin: Epidermis, Biofilm, Slough debrided: Level: Skin/Subcutaneous Tissue Debridement Description: Excisional Instrument: Curette Bleeding: Minimum Hemostasis Achieved: Pressure End Time: 11:47 Procedural Pain: 0 Post Procedural Pain: 0 Response  to Treatment: Procedure was tolerated well Level of Consciousness (Post- Awake and Alert procedure): Post Debridement Measurements of Total Wound Length: (cm) 2 Width: (cm) 0.8 Depth: (cm) 0.4 Volume: (cm) 0.503 Character of Wound/Ulcer Post Debridement: Improved Severity of Tissue Post Debridement: Fat layer exposed Post Procedure Diagnosis Same as Pre-procedure Electronic Signature(s) Signed: 01/08/2021 4:40:54 PM By: Worthy Keeler PA-C Signed: 01/09/2021 10:49:38 AM By: Carlene Coria RN Entered By: Carlene Coria on 01/08/2021 11:46:30 Billy Franco ([id number]) -------------------------------------------------------------------------------- HPI Details Patient Name: Billy Franco Date of Service: 01/08/2021 11:00 AM Medical Record Number: [id number] Patient Account Number: [id number] Date of Birth/Sex: Oct 14, 1940 (80 y.o. M) Treating RN: Carlene Coria Primary Care Provider: Steele Sizer Other Clinician: Referring Provider: Steele Sizer Treating Provider/Extender: Skipper Cliche in Treatment: 2 History of Present Illness HPI Description: 12/24/2020 upon evaluation today patient appears to be doing somewhat poorly in regard to the wound that actually occurred around November 13 2020. This was actually a dog bite he is unsure of what type of dog it was it was a small wound that wound was not a familiar dog to him. Subsequently he ended up being seen by his primary care provider in October 28 by his primary care provider. Subsequently they originally placed him on 2 rounds of amoxicillin he is also been on doxycycline. Unfortunately this just does not seem to be getting better despite the treatment up to this point they have been initially cleaning with alcohol although that has been discontinued when he went to the ER on November 14. They actually recommended cleaning with just soap and water. There is really not been any specific dressings that were recommended at this  time. Subsequently the patient does have an ABI of 1.3 here in the clinic today. This is on the right. Subsequently also has a recent hemoglobin A1c from when he was at the ER of 6.3. He really is not having any significant pain which is good news he does have some swelling which is going to need to be addressed as well. This is  minimal however I do not think he has to have a compression wrap I think Tubigrip would probably be appropriate. Patient does have a history of diabetes mellitus type 2, congestive heart failure, atrial fibrillation, and hypertension. 01/01/2021 upon evaluation today patient appears to be doing pretty well in regard to his wound. Fortunately I do not see any signs of infection and in 1 week's time I feel like we have made some good progress here. No fevers, chills, nausea, vomiting, or diarrhea. 01/08/2021 upon evaluation today patient's wound at this point is showing signs of doing really about the same. I am not seeing as much improvement as I would like to see. I think at this point that I would recommend we see about actually using a different medication I am thinking Iodosorb could be beneficial for him. He is in agreement with given something else to try here. Electronic Signature(s) Signed: 01/08/2021 3:06:08 PM By: Worthy Keeler PA-C Entered By: Worthy Keeler on 01/08/2021 15:06:08 [name], [name] ([id number]) -------------------------------------------------------------------------------- Physical Exam Details Patient Name: Billy Franco Date of Service: 01/08/2021 11:00 AM Medical Record Number: [id number] Patient Account Number: [id number] Date of Birth/Sex: June 28, 1940 (80 y.o. M) Treating RN: Carlene Coria Primary Care Provider: Steele Sizer Other Clinician: Referring Provider: Steele Sizer Treating Provider/Extender: Skipper Cliche in Treatment: 2 Constitutional Well-nourished and well-hydrated in no acute distress. Respiratory normal  breathing without difficulty. Psychiatric this patient is able to make decisions and demonstrates good insight into disease process. Alert and Oriented x 3. pleasant and cooperative. Notes Upon inspection patient's wound bed did require some sharp debridement he still had some necrotic tissue noted over the surface of the wound. He tolerated the debridement today without complication postdebridement the wound bed appears to be doing much better which is great news. Electronic Signature(s) Signed: 01/08/2021 3:06:31 PM By: Worthy Keeler PA-C Entered By: Worthy Keeler on 01/08/2021 15:06:30 Billy Franco ([id number]) -------------------------------------------------------------------------------- Physician Orders Details Patient Name: Billy Franco Date of Service: 01/08/2021 11:00 AM Medical Record Number: [id number] Patient Account Number: [id number] Date of Birth/Sex: [birth year] (80 y.o. M) Treating RN: Carlene Coria Primary Care Provider: Steele Sizer Other Clinician: Referring Provider: Steele Sizer Treating Provider/Extender: Skipper Cliche in Treatment: 2 Verbal / Phone Orders: No Diagnosis Coding ICD-10 Coding Code Description W54.0XXA Bitten by dog, initial encounter [phone] Non-pressure chronic ulcer of other part of right lower leg with fat layer exposed E11.622 Type 2 diabetes mellitus with other skin ulcer I50.42 Chronic combined systolic (congestive) and diastolic (congestive) heart failure I48.0 Paroxysmal atrial fibrillation I10 Essential (primary) hypertension Follow-up Appointments o Return Appointment in 1 week. Bathing/ Shower/ Hygiene o May shower; gently cleanse wound with antibacterial soap, rinse and pat dry prior to dressing wounds - may shower on days dressing changed, remove dressing, shower, reapply dressing Edema Control - Lymphedema / Segmental Compressive Device / Other o Tubigrip single layer applied. - side c o Elevate,  Exercise Daily and Avoid Standing for Long Periods of Time. o Elevate legs to the level of the heart and pump ankles as often as possible o Elevate leg(s) parallel to the floor when sitting. Wound Treatment Wound #1 - Lower Leg Wound Laterality: Right, Lateral Cleanser: Byram Ancillary Kit - 15 Day Supply (Generic) 3 x Per Week/30 Days Discharge Instructions: Use supplies as instructed; Kit contains: (15) Saline Bullets; (15) 3x3 Gauze; 15 pr Gloves Cleanser: Soap and Water 3 x Per Week/30 Days Discharge Instructions: Gently cleanse wound with antibacterial soap, rinse and pat dry  prior to dressing wounds Primary Dressing: IODOFLEX 0.9% Cadexomer Iodine Pad 3 x Per Week/30 Days Discharge Instructions: Apply Iodoflex to wound bed only as directed. Secondary Dressing: ABD Pad 5x9 (in/in) (Generic) 3 x Per Week/30 Days Discharge Instructions: Cover with ABD pad Secured With: 25M Medipore H Soft Cloth Surgical Tape, 2x2 (in/yd) (Generic) 3 x Per Week/30 Days Compression Wrap: tubi grip 3 x Per Week/30 Days Discharge Instructions: size c Electronic Signature(s) Signed: 01/08/2021 4:40:54 PM By: Worthy Keeler PA-C Signed: 01/09/2021 10:49:38 AM By: Carlene Coria RN Entered By: Carlene Coria on 01/08/2021 11:44:45 Billy Franco ([id number]) -------------------------------------------------------------------------------- Problem List Details Patient Name: Billy Franco Date of Service: 01/08/2021 11:00 AM Medical Record Number: [id number] Patient Account Number: [id number] Date of Birth/Sex: 01-12-1941 (80 y.o. M) Treating RN: Carlene Coria Primary Care Provider: Steele Sizer Other Clinician: Referring Provider: Steele Sizer Treating Provider/Extender: Skipper Cliche in Treatment: 2 Active Problems ICD-10 Encounter Code Description Active Date MDM Diagnosis W54.0XXA Bitten by dog, initial encounter 12/24/2020 No Yes L97.812 Non-pressure chronic ulcer of other part of right  lower leg with fat layer 12/24/2020 No Yes exposed E11.622 Type 2 diabetes mellitus with other skin ulcer 12/24/2020 No Yes I50.42 Chronic combined systolic (congestive) and diastolic (congestive) heart 12/24/2020 No Yes failure I48.0 Paroxysmal atrial fibrillation 12/24/2020 No Yes I10 Essential (primary) hypertension 12/24/2020 No Yes Inactive Problems Resolved Problems Electronic Signature(s) Signed: 01/08/2021 11:39:57 AM By: Worthy Keeler PA-C Entered By: Worthy Keeler on 01/08/2021 11:39:56 Billy Franco ([id number]) -------------------------------------------------------------------------------- Progress Note Details Patient Name: Billy Franco Date of Service: 01/08/2021 11:00 AM Medical Record Number: [id number] Patient Account Number: [id number] Date of Birth/Sex: March 09, 1940 (80 y.o. M) Treating RN: Carlene Coria Primary Care Provider: Steele Sizer Other Clinician: Referring Provider: Steele Sizer Treating Provider/Extender: Skipper Cliche in Treatment: 2 Subjective Chief Complaint Information obtained from Patient Right Leg Dog Bite History of Present Illness (HPI) 12/24/2020 upon evaluation today patient appears to be doing somewhat poorly in regard to the wound that actually occurred around November 13 2020. This was actually a dog bite he is unsure of what type of dog it was it was a small wound that wound was not a familiar dog to him. Subsequently he ended up being seen by his primary care provider in October 28 by his primary care provider. Subsequently they originally placed him on 2 rounds of amoxicillin he is also been on doxycycline. Unfortunately this just does not seem to be getting better despite the treatment up to this point they have been initially cleaning with alcohol although that has been discontinued when he went to the ER on November 14. They actually recommended cleaning with just soap and water. There is really not been any specific dressings  that were recommended at this time. Subsequently the patient does have an ABI of 1.3 here in the clinic today. This is on the right. Subsequently also has a recent hemoglobin A1c from when he was at the ER of 6.3. He really is not having any significant pain which is good news he does have some swelling which is going to need to be addressed as well. This is minimal however I do not think he has to have a compression wrap I think Tubigrip would probably be appropriate. Patient does have a history of diabetes mellitus type 2, congestive heart failure, atrial fibrillation, and hypertension. 01/01/2021 upon evaluation today patient appears to be doing pretty well in regard to his wound. Fortunately I do not see any signs  of infection and in 1 week's time I feel like we have made some good progress here. No fevers, chills, nausea, vomiting, or diarrhea. 01/08/2021 upon evaluation today patient's wound at this point is showing signs of doing really about the same. I am not seeing as much improvement as I would like to see. I think at this point that I would recommend we see about actually using a different medication I am thinking Iodosorb could be beneficial for him. He is in agreement with given something else to try here. Objective Constitutional Well-nourished and well-hydrated in no acute distress. Vitals Time Taken: 11:29 AM, Height: 67 in, Weight: 150 lbs, BMI: 23.5, Temperature: 97.9 F, Pulse: 71 bpm, Respiratory Rate: 18 breaths/min, Blood Pressure: 112/66 mmHg. Respiratory normal breathing without difficulty. Psychiatric this patient is able to make decisions and demonstrates good insight into disease process. Alert and Oriented x 3. pleasant and cooperative. General Notes: Upon inspection patient's wound bed did require some sharp debridement he still had some necrotic tissue noted over the surface of the wound. He tolerated the debridement today without complication postdebridement the  wound bed appears to be doing much better which is great news. Integumentary (Hair, Skin) Wound #1 status is Open. Original cause of wound was Bite. The date acquired was: 11/16/2020. The wound has been in treatment 2 weeks. The wound is located on the Right,Lateral Lower Leg. The wound measures 2cm length x 0.8cm width x 0.4cm depth; 1.257cm^2 area and 0.503cm^3 volume. There is no tunneling or undermining noted. There is a medium amount of serosanguineous drainage noted. There is small (1-33%) red granulation within the wound bed. There is a large (67-100%) amount of necrotic tissue within the wound bed including Adherent Slough. ANTHONIO, MIZZELL ([id number]) Assessment Active Problems ICD-10 Bitten by dog, initial encounter Non-pressure chronic ulcer of other part of right lower leg with fat layer exposed Type 2 diabetes mellitus with other skin ulcer Chronic combined systolic (congestive) and diastolic (congestive) heart failure Paroxysmal atrial fibrillation Essential (primary) hypertension Procedures Wound #1 Pre-procedure diagnosis of Wound #1 is a Diabetic Wound/Ulcer of the Lower Extremity located on the Right,Lateral Lower Leg .Severity of Tissue Pre Debridement is: Fat layer exposed. There was a Excisional Skin/Subcutaneous Tissue Debridement with a total area of 1.6 sq cm performed by Tommie Sams., PA-C. With the following instrument(s): Curette to remove Viable and Non-Viable tissue/material. Material removed includes Subcutaneous Tissue, Slough, Skin: Dermis, Skin: Epidermis, and Biofilm after achieving pain control using Lidocaine 4% Topical Solution. No specimens were taken. A time out was conducted at 11:40, prior to the start of the procedure. A Minimum amount of bleeding was controlled with Pressure. The procedure was tolerated well with a pain level of 0 throughout and a pain level of 0 following the procedure. Post Debridement Measurements: 2cm length x 0.8cm width x  0.4cm depth; 0.503cm^3 volume. Character of Wound/Ulcer Post Debridement is improved. Severity of Tissue Post Debridement is: Fat layer exposed. Post procedure Diagnosis Wound #1: Same as Pre-Procedure Plan Follow-up Appointments: Return Appointment in 1 week. Bathing/ Shower/ Hygiene: May shower; gently cleanse wound with antibacterial soap, rinse and pat dry prior to dressing wounds - may shower on days dressing changed, remove dressing, shower, reapply dressing Edema Control - Lymphedema / Segmental Compressive Device / Other: Tubigrip single layer applied. - side c Elevate, Exercise Daily and Avoid Standing for Long Periods of Time. Elevate legs to the level of the heart and pump ankles as often as possible Elevate leg(s)  parallel to the floor when sitting. WOUND #1: - Lower Leg Wound Laterality: Right, Lateral Cleanser: Byram Ancillary Kit - 15 Day Supply (Generic) 3 x Per Week/30 Days Discharge Instructions: Use supplies as instructed; Kit contains: (15) Saline Bullets; (15) 3x3 Gauze; 15 pr Gloves Cleanser: Soap and Water 3 x Per Week/30 Days Discharge Instructions: Gently cleanse wound with antibacterial soap, rinse and pat dry prior to dressing wounds Primary Dressing: IODOFLEX 0.9% Cadexomer Iodine Pad 3 x Per Week/30 Days Discharge Instructions: Apply Iodoflex to wound bed only as directed. Secondary Dressing: ABD Pad 5x9 (in/in) (Generic) 3 x Per Week/30 Days Discharge Instructions: Cover with ABD pad Secured With: 67M Medipore H Soft Cloth Surgical Tape, 2x2 (in/yd) (Generic) 3 x Per Week/30 Days Compression Wrap: tubi grip 3 x Per Week/30 Days Discharge Instructions: size c 1. I would recommend currently that we actually switch over to Iodosorb which I think is can be a much better option treatment wise at this point. I think that the patient is having a lot of issues here currently with his slough buildup and again I think this may be a superior product to help with that we  can always go back to the collagen later if need be but right now I think this is a improvement overall in the plan. 2. I am also can recommend at this time that we have the patient continue with the Tubigrip to help with edema control I think that is doing quite well. 3. I am also can recommend that we have the patient continue to elevate his legs much as possible to help with edema control. We will see patient back for reevaluation in 1 week here in the clinic. If anything worsens or changes patient will contact our office for additional recommendations. AUSTIN, HERD ([id number]) Electronic Signature(s) Signed: 01/08/2021 3:07:21 PM By: Worthy Keeler PA-C Entered By: Worthy Keeler on 01/08/2021 15:07:20 Billy Franco ([id number]) -------------------------------------------------------------------------------- SuperBill Details Patient Name: Billy Franco Date of Service: 01/08/2021 Medical Record Number: [id number] Patient Account Number: [id number] Date of Birth/Sex: 19-Jul-1940 (80 y.o. M) Treating RN: Carlene Coria Primary Care Provider: Steele Sizer Other Clinician: Referring Provider: Steele Sizer Treating Provider/Extender: Skipper Cliche in Treatment: 2 Diagnosis Coding ICD-10 Codes Code Description [phone] Bitten by dog, initial encounter [phone] Non-pressure chronic ulcer of other part of right lower leg with fat layer exposed E11.622 Type 2 diabetes mellitus with other skin ulcer I50.42 Chronic combined systolic (congestive) and diastolic (congestive) heart failure I48.0 Paroxysmal atrial fibrillation I10 Essential (primary) hypertension Facility Procedures CPT4 Code: [id number] Description: 14431 - DEB SUBQ TISSUE 20 SQ CM/< Modifier: Quantity: 1 CPT4 Code: Description: ICD-10 Diagnosis Description V40.086 Non-pressure chronic ulcer of other part of right lower leg with fat lay Modifier: er exposed Quantity: Physician Procedures CPT4 Code:  [id number] Description: 11042 - WC PHYS SUBQ TISS 20 SQ CM Modifier: Quantity: 1 CPT4 Code: Description: ICD-10 Diagnosis Description T26.712 Non-pressure chronic ulcer of other part of right lower leg with fat lay Modifier: er exposed Quantity: Electronic Signature(s) Signed: 01/08/2021 3:07:44 PM By: Worthy Keeler PA-C Entered By: Worthy Keeler on 01/08/2021 15:07:44

## 2021-01-09 NOTE — Progress Notes (Signed)
Billy Franco ([id number]) Visit Report for 01/08/2021 Arrival Information Details Patient Name: Billy Franco Date of Service: 01/08/2021 11:00 AM Medical Record Number: [id number] Patient Account Number: [id number] Date of Birth/Sex: [birth year] (80 y.o. M) Treating RN: Carlene Coria Primary Care Myrl Bynum: Steele Sizer Other Clinician: Referring Gabrella Stroh: Steele Sizer Treating Shylo Zamor/Extender: Skipper Cliche in Treatment: 2 Visit Information History Since Last Visit All ordered tests and consults were completed: No Patient Arrived: Ambulatory Added or deleted any medications: No Arrival Time: 11:24 Any new allergies or adverse reactions: No Accompanied By: wife Had a fall or experienced change in No Transfer Assistance: None activities of daily living that may affect Patient Identification Verified: Yes risk of falls: Secondary Verification Process Completed: Yes Signs or symptoms of abuse/neglect since last visito No Patient Requires Transmission-Based No Hospitalized since last visit: No Precautions: Implantable device outside of the clinic excluding No Patient Has Alerts: Yes cellular tissue based products placed in the center Patient Alerts: Patient on Blood since last visit: Thinner Has Dressing in Place as Prescribed: Yes Has Compression in Place as Prescribed: Yes Pain Present Now: No Electronic Signature(s) Signed: 01/09/2021 10:49:38 AM By: Carlene Coria RN Entered By: Carlene Coria on 01/08/2021 11:28:40 Billy Franco ([id number]) -------------------------------------------------------------------------------- Clinic Level of Care Assessment Details Patient Name: Billy Franco Date of Service: 01/08/2021 11:00 AM Medical Record Number: [id number] Patient Account Number: [id number] Date of Birth/Sex: December 03, 1940 (80 y.o. M) Treating RN: Carlene Coria Primary Care Tavarious Freel: Steele Sizer Other Clinician: Referring Marques Ericson: Steele Sizer Treating [place]/Extender: Skipper Cliche in Treatment: 2 Clinic Level of Care Assessment Items TOOL 4 Quantity Score [] - Use when only an EandM is performed on FOLLOW-UP visit 0 ASSESSMENTS - Nursing Assessment / Reassessment [] - Reassessment of Co-morbidities (includes updates in patient status) 0 [] - 0 Reassessment of Adherence to Treatment Plan ASSESSMENTS - Wound and Skin Assessment / Reassessment [] - Simple Wound Assessment / Reassessment - one wound 0 [] - 0 Complex Wound Assessment / Reassessment - multiple wounds [] - 0 Dermatologic / Skin Assessment (not related to wound area) ASSESSMENTS - Focused Assessment [] - Circumferential Edema Measurements - multi extremities 0 [] - 0 Nutritional Assessment / Counseling / Intervention [] - 0 Lower Extremity Assessment (monofilament, tuning fork, pulses) [] - 0 Peripheral Arterial Disease Assessment (using hand held doppler) ASSESSMENTS - Ostomy and/or Continence Assessment and Care [] - Incontinence Assessment and Management 0 [] - 0 Ostomy Care Assessment and Management (repouching, etc.) PROCESS - Coordination of Care [] - Simple Patient / Family Education for ongoing care 0 [] - 0 Complex (extensive) Patient / Family Education for ongoing care [] - 0 Staff obtains Programmer, systems, Records, Test Results / Process Orders [] - 0 Staff telephones HHA, Nursing Homes / Clarify orders / etc [] - 0 Routine Transfer to another Facility (non-emergent condition) [] - 0 Routine Hospital Admission (non-emergent condition) [] - 0 New Admissions / Biomedical engineer / Ordering NPWT, Apligraf, etc. [] - 0 Emergency Hospital Admission (emergent condition) [] - 0 Simple Discharge Coordination [] - 0 Complex (extensive) Discharge Coordination PROCESS - Special Needs [] - Pediatric / Minor Patient Management 0 [] - 0 Isolation Patient Management [] - 0 Hearing / Language / Visual special needs [] - 0 Assessment  of Community assistance (transportation, D/C planning, etc.) [] - 0 Additional assistance / Altered mentation [] - 0 Support Surface(s) Assessment (bed, cushion, seat, etc.) INTERVENTIONS - Wound Cleansing / Measurement Billy Franco ([id number]) [] - 0  Simple Wound Cleansing - one wound [] - 0 Complex Wound Cleansing - multiple wounds [] - 0 Wound Imaging (photographs - any number of wounds) [] - 0 Wound Tracing (instead of photographs) [] - 0 Simple Wound Measurement - one wound [] - 0 Complex Wound Measurement - multiple wounds INTERVENTIONS - Wound Dressings [] - Small Wound Dressing one or multiple wounds 0 [] - 0 Medium Wound Dressing one or multiple wounds [] - 0 Large Wound Dressing one or multiple wounds [] - 0 Application of Medications - topical [] - 0 Application of Medications - injection INTERVENTIONS - Miscellaneous [] - External ear exam 0 [] - 0 Specimen Collection (cultures, biopsies, blood, body fluids, etc.) [] - 0 Specimen(s) / Culture(s) sent or taken to Lab for analysis [] - 0 Patient Transfer (multiple staff / [employer] fast streamer / Similar devices) [] - 0 Simple Staple / Suture removal (25 or less) [] - 0 Complex Staple / Suture removal (26 or more) [] - 0 Hypo / Hyperglycemic Management (close monitor of Blood Glucose) [] - 0 Ankle / Brachial Index (ABI) - do not check if billed separately [] - 0 Vital Signs Has the patient been seen at the hospital within the last three years: Yes Total Score: 0 Level Of Care: ____ Electronic Signature(s) Signed: 01/09/2021 10:49:38 AM By: Carlene Coria RN Entered By: Carlene Coria on 01/08/2021 11:56:38 Billy Franco ([id number]) -------------------------------------------------------------------------------- Encounter Discharge Information Details Patient Name: Billy Franco Date of Service: 01/08/2021 11:00 AM Medical Record Number: [id number] Patient Account Number: [id number] Date of Birth/Sex:  Jun 02, 1940 (80 y.o. M) Treating RN: Carlene Coria Primary Care Provider: Steele Sizer Other Clinician: Referring Provider: Steele Sizer Treating Provider/Extender: Skipper Cliche in Treatment: 2 Encounter Discharge Information Items Post Procedure Vitals Discharge Condition: Stable Temperature (F): 97.9 Ambulatory Status: Ambulatory Pulse (bpm): 71 Discharge Destination: Home Respiratory Rate (breaths/min): 18 Transportation: Private Auto Blood Pressure (mmHg): 112/66 Accompanied By: self Schedule Follow-up Appointment: Yes Clinical Summary of Care: Patient Declined Electronic Signature(s) Signed: 01/08/2021 12:00:05 PM By: Carlene Coria RN Entered By: Carlene Coria on 01/08/2021 12:00:05 Billy Franco ([id number]) -------------------------------------------------------------------------------- Lower Extremity Assessment Details Patient Name: Billy Franco Date of Service: 01/08/2021 11:00 AM Medical Record Number: [id number] Patient Account Number: [id number] Date of Birth/Sex: [birth year] (80 y.o. M) Treating RN: Carlene Coria Primary Care Provider: Steele Sizer Other Clinician: Referring Provider: Steele Sizer Treating Provider/Extender: Skipper Cliche in Treatment: 2 Edema Assessment Assessed: [Left: No] [Right: No] Edema: [Left: Ye] [Right: s] Calf Left: Right: Point of Measurement: 36 cm From Medial Instep 31 cm Ankle Left: Right: Point of Measurement: 10 cm From Medial Instep 22 cm Vascular Assessment Pulses: Dorsalis Pedis Palpable: [Right:Yes] Electronic Signature(s) Signed: 01/09/2021 10:49:38 AM By: Carlene Coria RN Entered By: Carlene Coria on 01/08/2021 11:36:16 Billy Franco ([id number]) -------------------------------------------------------------------------------- Multi Wound Chart Details Patient Name: Billy Franco Date of Service: 01/08/2021 11:00 AM Medical Record Number: [id number] Patient Account Number: [id number] Date of  Birth/Sex: [birth year] (80 y.o. M) Treating RN: Carlene Coria Primary Care Provider: Steele Sizer Other Clinician: Referring Provider: Steele Sizer Treating Provider/Extender: Skipper Cliche in Treatment: 2 Vital Signs Height(in): 98 Pulse(bpm): 79 Weight(lbs): 150 Blood Pressure(mmHg): 112/66 Body Mass Index(BMI): 23 Temperature(F): 97.9 Respiratory Rate(breaths/min): 18 Photos: [N/A:N/A] Wound Location: Right, Lateral Lower Leg N/A N/A Wounding Event: Bite N/A N/A Primary Etiology: Diabetic Wound/Ulcer of the Lower N/A N/A Extremity Comorbid History: Congestive Heart Failure, N/A N/A Hypertension, Type II Diabetes Date Acquired: 11/16/2020 N/A N/A Weeks of Treatment: 2  N/A N/A Wound Status: Open N/A N/A Measurements L x W x D (cm) 2x0.8x0.4 N/A N/A Area (cm) : 1.257 N/A N/A Volume (cm) : 0.503 N/A N/A % Reduction in Area: 33.30% N/A N/A % Reduction in Volume: 46.60% N/A N/A Classification: Grade 2 N/A N/A Exudate Amount: Medium N/A N/A Exudate Type: Serosanguineous N/A N/A Exudate Color: red, brown N/A N/A Granulation Amount: Small (1-33%) N/A N/A Granulation Quality: Red N/A N/A Necrotic Amount: Large (67-100%) N/A N/A Exposed Structures: Fascia: No N/A N/A Fat Layer (Subcutaneous Tissue): No Tendon: No Muscle: No Joint: No Bone: No Epithelialization: None N/A N/A Treatment Notes Electronic Signature(s) Signed: 01/09/2021 10:49:38 AM By: Carlene Coria RN Entered By: Carlene Coria on 01/08/2021 11:42:21 Billy Franco ([id number]) -------------------------------------------------------------------------------- Multi-Disciplinary Care Plan Details Patient Name: Billy Franco Date of Service: 01/08/2021 11:00 AM Medical Record Number: [id number] Patient Account Number: [id number] Date of Birth/Sex: November 22, 1940 (80 y.o. M) Treating RN: Carlene Coria Primary Care [place] Yarbro: Steele Sizer Other Clinician: Referring Lashell Moffitt: Steele Sizer Treating  Evanny Ellerbe/Extender: Skipper Cliche in Treatment: 2 Active Inactive Wound/Skin Impairment Nursing Diagnoses: Knowledge deficit related to ulceration/compromised skin integrity Goals: Patient/caregiver will verbalize understanding of skin care regimen Date Initiated: 12/24/2020 Target Resolution Date: 01/24/2021 Goal Status: Active Ulcer/skin breakdown will have a volume reduction of 30% by week 4 Date Initiated: 12/24/2020 Target Resolution Date: 02/24/2021 Goal Status: Active Ulcer/skin breakdown will have a volume reduction of 50% by week 8 Date Initiated: 12/24/2020 Target Resolution Date: 03/24/2021 Goal Status: Active Ulcer/skin breakdown will have a volume reduction of 80% by week 12 Date Initiated: 12/24/2020 Target Resolution Date: 04/24/2021 Goal Status: Active Ulcer/skin breakdown will heal within 14 weeks Date Initiated: 12/24/2020 Target Resolution Date: 05/24/2021 Goal Status: Active Interventions: Assess patient/caregiver ability to obtain necessary supplies Assess patient/caregiver ability to perform ulcer/skin care regimen upon admission and as needed Assess ulceration(s) every visit Notes: Electronic Signature(s) Signed: 01/09/2021 10:49:38 AM By: Carlene Coria RN Entered By: Carlene Coria on 01/08/2021 Towns, Ben ([id number]) -------------------------------------------------------------------------------- Pain Assessment Details Patient Name: Billy Franco Date of Service: 01/08/2021 11:00 AM Medical Record Number: [id number] Patient Account Number: [id number] Date of Birth/Sex: 01-30-1940 (80 y.o. M) Treating RN: Carlene Coria Primary Care Brianna Esson: Steele Sizer Other Clinician: Referring Aza Dantes: Steele Sizer Treating Masayo Fera/Extender: Skipper Cliche in Treatment: 2 Active Problems Location of Pain Severity and Description of Pain Patient Has Paino No Site Locations Pain Management and Medication Current Pain Management: Electronic  Signature(s) Signed: 01/09/2021 10:49:38 AM By: Carlene Coria RN Entered By: Carlene Coria on 01/08/2021 11:30:19 Billy Franco ([id number]) -------------------------------------------------------------------------------- Patient/Caregiver Education Details Patient Name: Billy Franco Date of Service: 01/08/2021 11:00 AM Medical Record Number: [id number] Patient Account Number: [id number] Date of Birth/Gender: December 26, 1940 (80 y.o. M) Treating RN: Carlene Coria Primary Care Physician: Steele Sizer Other Clinician: Referring Physician: Steele Sizer Treating Physician/Extender: Skipper Cliche in Treatment: 2 Education Assessment Education Provided To: Patient Education Topics Provided Wound/Skin Impairment: Methods: Explain/Verbal Responses: State content correctly Electronic Signature(s) Signed: 01/09/2021 10:49:38 AM By: Carlene Coria RN Entered By: Carlene Coria on 01/08/2021 11:57:09 Billy Franco ([id number]) -------------------------------------------------------------------------------- Wound Assessment Details Patient Name: Billy Franco Date of Service: 01/08/2021 11:00 AM Medical Record Number: [id number] Patient Account Number: [id number] Date of Birth/Sex: 02-21-1940 (80 y.o. M) Treating RN: Carlene Coria Primary Care Elisandro Jarrett: Steele Sizer Other Clinician: Referring Carrolyn Hilmes: Steele Sizer Treating Ciel Yanes/Extender: Skipper Cliche in Treatment: 2 Wound Status Wound Number: 1 Primary Etiology: Diabetic Wound/Ulcer of the Lower Extremity Wound Location: Right, Lateral Lower Leg Wound Status: Open  Wounding Event: Bite Comorbid Congestive Heart Failure, Hypertension, Type II History: Diabetes Date Acquired: 11/16/2020 Weeks Of Treatment: 2 Clustered Wound: No Photos Wound Measurements Length: (cm) 2 Width: (cm) 0.8 Depth: (cm) 0.4 Area: (cm) 1.257 Volume: (cm) 0.503 % Reduction in Area: 33.3% % Reduction in Volume: 46.6% Epithelialization:  None Tunneling: No Undermining: No Wound Description Classification: Grade 2 Exudate Amount: Medium Exudate Type: Serosanguineous Exudate Color: red, brown Foul Odor After Cleansing: No Slough/Fibrino Yes Wound Bed Granulation Amount: Small (1-33%) Exposed Structure Granulation Quality: Red Fascia Exposed: No Necrotic Amount: Large (67-100%) Fat Layer (Subcutaneous Tissue) Exposed: No Necrotic Quality: Adherent Slough Tendon Exposed: No Muscle Exposed: No Joint Exposed: No Bone Exposed: No Treatment Notes Wound #1 (Lower Leg) Wound Laterality: Right, Lateral Cleanser [place] Ancillary Kit - 15 Day Supply Discharge Instruction: Use supplies as instructed; Kit contains: (15) Saline Bullets; (15) 3x3 Gauze; 15 pr Gloves Soap and Water Discharge Instruction: Gently cleanse wound with antibacterial soap, rinse and pat dry prior to dressing wounds Schlesinger, Narayan ([id number]) Peri-Wound Care Topical Primary Dressing IODOFLEX 0.9% Cadexomer Iodine Pad Discharge Instruction: Apply Iodoflex to wound bed only as directed. Secondary Dressing ABD Pad 5x9 (in/in) Discharge Instruction: Cover with ABD pad Secured With 47M [place] Surgical Tape, 2x2 (in/yd) Compression Wrap tubi grip Discharge Instruction: size c Compression Stockings Add-Ons Electronic Signature(s) Signed: 01/09/2021 10:49:38 AM By: Carlene Coria RN Entered By: Carlene Coria on 01/08/2021 11:34:33 Billy Franco ([id number]) -------------------------------------------------------------------------------- Vitals Details Patient Name: Billy Franco Date of Service: 01/08/2021 11:00 AM Medical Record Number: [id number] Patient Account Number: [id number] Date of Birth/Sex: [birth year] (80 y.o. M) Treating RN: Carlene Coria Primary Care Provider: Steele Sizer Other Clinician: Referring Provider: Steele Sizer Treating Provider/Extender: Skipper Cliche in Treatment: 2 Vital Signs Time Taken:  11:29 Temperature (F): 97.9 Height (in): 67 Pulse (bpm): 71 Weight (lbs): 150 Respiratory Rate (breaths/min): 18 Body Mass Index (BMI): 23.5 Blood Pressure (mmHg): 112/66 Reference Range: 80 - 120 mg / dl Electronic Signature(s) Signed: 01/09/2021 10:49:38 AM By: Carlene Coria RN Entered By: Carlene Coria on 01/08/2021 11:29:58

## 2021-01-15 ENCOUNTER — Other Ambulatory Visit: Payer: Self-pay

## 2021-01-15 ENCOUNTER — Encounter: Payer: Medicare Other | Admitting: Internal Medicine

## 2021-01-15 DIAGNOSIS — I5042 Chronic combined systolic (congestive) and diastolic (congestive) heart failure: Secondary | ICD-10-CM | POA: Diagnosis not present

## 2021-01-15 DIAGNOSIS — L97812 Non-pressure chronic ulcer of other part of right lower leg with fat layer exposed: Secondary | ICD-10-CM | POA: Diagnosis not present

## 2021-01-15 DIAGNOSIS — I110 Hypertensive heart disease with heart failure: Secondary | ICD-10-CM | POA: Diagnosis not present

## 2021-01-15 DIAGNOSIS — E1151 Type 2 diabetes mellitus with diabetic peripheral angiopathy without gangrene: Secondary | ICD-10-CM | POA: Diagnosis not present

## 2021-01-15 DIAGNOSIS — I10 Essential (primary) hypertension: Secondary | ICD-10-CM | POA: Diagnosis not present

## 2021-01-15 DIAGNOSIS — E11622 Type 2 diabetes mellitus with other skin ulcer: Secondary | ICD-10-CM | POA: Diagnosis not present

## 2021-01-18 NOTE — Progress Notes (Signed)
DARRIS, STAIGER ([id number]) Visit Report for 01/15/2021 HPI Details Patient Name: Billy Franco, Billy Franco Date of Service: 01/15/2021 2:45 PM Medical Record Number: [id number] Patient Account Number: [id number] Date of Birth/Sex: 1940/01/28 (80 y.o. M) Treating RN: Carlene Coria Primary Care Provider: Steele Sizer Other Clinician: Referring Provider: Steele Sizer Treating Provider/Extender: Tito Dine in Treatment: 3 History of Present Illness HPI Description: 12/24/2020 upon evaluation today patient appears to be doing somewhat poorly in regard to the wound that actually occurred around November 13 2020. This was actually a dog bite he is unsure of what type of dog it was it was a small wound that wound was not a familiar dog to him. Subsequently he ended up being seen by his primary care provider in October 28 by his primary care provider. Subsequently they originally placed him on 2 rounds of amoxicillin he is also been on doxycycline. Unfortunately this just does not seem to be getting better despite the treatment up to this point they have been initially cleaning with alcohol although that has been discontinued when he went to the ER on November 14. They actually recommended cleaning with just soap and water. There is really not been any specific dressings that were recommended at this time. Subsequently the patient does have an ABI of 1.3 here in the clinic today. This is on the right. Subsequently also has a recent hemoglobin A1c from when he was at the ER of 6.3. He really is not having any significant pain which is good news he does have some swelling which is going to need to be addressed as well. This is minimal however I do not think he has to have a compression wrap I think Tubigrip would probably be appropriate. Patient does have a history of diabetes mellitus type 2, congestive heart failure, atrial fibrillation, and hypertension. 01/01/2021 upon evaluation today patient  appears to be doing pretty well in regard to his wound. Fortunately I do not see any signs of infection and in 1 week's time I feel like we have made some good progress here. No fevers, chills, nausea, vomiting, or diarrhea. 01/08/2021 upon evaluation today patient's wound at this point is showing signs of doing really about the same. I am not seeing as much improvement as I would like to see. I think at this point that I would recommend we see about actually using a different medication I am thinking Iodosorb could be beneficial for him. He is in agreement with given something else to try here. 12/27; use Iodoflex starting last week. Wound surface looks better but no major change in dimensions Electronic Signature(s) Signed: 01/15/2021 4:21:37 PM By: Linton Ham MD Entered By: Linton Ham on 01/15/2021 16:04:10 Billy Franco ([id number]) -------------------------------------------------------------------------------- Physical Exam Details Patient Name: Billy Franco Date of Service: 01/15/2021 2:45 PM Medical Record Number: [id number] Patient Account Number: [id number] Date of Birth/Sex: 15-Sep-1940 (80 y.o. M) Treating RN: Carlene Coria Primary Care Provider: Steele Sizer Other Clinician: Referring Provider: Steele Sizer Treating Provider/Extender: Tito Dine in Treatment: 3 Constitutional Sitting or standing Blood Pressure is within target range for patient.. Pulse regular and within target range for patient.Marland Kitchen Respirations regular, non- labored and within target range.. Temperature is normal and within the target range for the patient.Marland Kitchen appears in no distress. Cardiovascular Pedal pulses palpable. Notes Wound exam; no mechanical debridement. The surface actually looks quite good. No evidence of surrounding infection. Edema control is excellent. Electronic Signature(s) Signed: 01/15/2021 4:21:37 PM By: Linton Ham MD Entered By: Dellia Nims,  Euleta Belson on  01/15/2021 16:05:11 Billy Franco, Billy Franco ([id number]) -------------------------------------------------------------------------------- Physician Orders Details Patient Name: Billy Franco, Billy Franco Date of Service: 01/15/2021 2:45 PM Medical Record Number: [id number] Patient Account Number: [id number] Date of Birth/Sex: 03/09/1940 (80 y.o. M) Treating RN: Carlene Coria Primary Care Provider: Steele Sizer Other Clinician: Referring Provider: Steele Sizer Treating Provider/Extender: Tito Dine in Treatment: 3 Verbal / Phone Orders: No Diagnosis Coding Follow-up Appointments o Return Appointment in 1 week. Bathing/ Shower/ Hygiene o May shower; gently cleanse wound with antibacterial soap, rinse and pat dry prior to dressing wounds - may shower on days dressing changed, remove dressing, shower, reapply dressing Edema Control - Lymphedema / Segmental Compressive Device / Other o Tubigrip single layer applied. - side c o Elevate, Exercise Daily and Avoid Standing for Long Periods of Time. o Elevate legs to the level of the heart and pump ankles as often as possible o Elevate leg(s) parallel to the floor when sitting. Wound Treatment Wound #1 - Lower Leg Wound Laterality: Right, Lateral Cleanser: Byram Ancillary Kit - 15 Day Supply (Generic) 3 x Per Week/30 Days Discharge Instructions: Use supplies as instructed; Kit contains: (15) Saline Bullets; (15) 3x3 Gauze; 15 pr Gloves Cleanser: Soap and Water 3 x Per Week/30 Days Discharge Instructions: Gently cleanse wound with antibacterial soap, rinse and pat dry prior to dressing wounds Primary Dressing: IODOFLEX 0.9% Cadexomer Iodine Pad 3 x Per Week/30 Days Discharge Instructions: Apply Iodoflex to wound bed only as directed. Secondary Dressing: ABD Pad 5x9 (in/in) (Generic) 3 x Per Week/30 Days Discharge Instructions: Cover with ABD pad Secured With: 62M Medipore H Soft Cloth Surgical Tape, 2x2 (in/yd) (Generic) 3 x Per  Week/30 Days Compression Wrap: tubi grip 3 x Per Week/30 Days Discharge Instructions: size c Electronic Signature(s) Signed: 01/15/2021 4:21:37 PM By: Linton Ham MD Signed: 01/18/2021 4:17:41 PM By: Carlene Coria RN Entered By: Carlene Coria on 01/15/2021 15:32:22 Billy Franco ([id number]) -------------------------------------------------------------------------------- Problem List Details Patient Name: Billy Franco Date of Service: 01/15/2021 2:45 PM Medical Record Number: [id number] Patient Account Number: [id number] Date of Birth/Sex: Apr 25, 1940 (80 y.o. 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 M) Treating RN: Carlene Coria Primary Care Provider: Steele Sizer Other Clinician: Referring Provider: Steele Sizer Treating Provider/Extender:  Tito Dine in Treatment: 3 Subjective History of Present Illness (HPI) 12/24/2020 upon evaluation today patient appears to be doing somewhat poorly in regard to the wound that actually occurred around November 13 2020. This was actually a dog bite he is unsure of what type of dog it was it was a small wound that wound was not a familiar dog to him. Subsequently he ended up being  seen by his primary care provider in October 28 by his primary care provider. Subsequently they originally placed him on 2 rounds of amoxicillin he is also been on doxycycline. Unfortunately this just does not seem to be getting better despite the treatment up to this point they have been initially cleaning with alcohol although that has been discontinued when he went to the ER on November 14. They actually recommended cleaning with just soap and water. There is really not been any specific dressings that were recommended at this time. Subsequently the patient does have an ABI of 1.3 here in the clinic today. This is on the right. Subsequently also has a recent hemoglobin A1c from when he was at the ER of 6.3. He really is not having any significant pain which is good news he does have some swelling which is going to need to be addressed as well. This is minimal however I do not think he has to have a compression wrap I think Tubigrip would probably be appropriate. Patient does have a history of diabetes mellitus type 2, congestive heart failure, atrial fibrillation, and hypertension. 01/01/2021 upon evaluation today patient appears to be doing pretty well in regard to his wound. Fortunately I do not see any signs of infection and in 1 week's time I feel like we have made some good progress here. No fevers, chills, nausea, vomiting, or diarrhea. 01/08/2021 upon evaluation today patient's wound at this point is showing signs of doing really about the same. I am not seeing as much improvement as I would like to  see. I think at this point that I would recommend we see about actually using a different medication I am thinking Iodosorb could be beneficial for him. He is in agreement with given something else to try here. 12/27; use Iodoflex starting last week. Wound surface looks better but no major change in dimensions Objective Constitutional Sitting or standing Blood Pressure is within target range for patient.. Pulse regular and within target range for patient.Marland Kitchen Respirations regular, non- labored and within target range.. Temperature is normal and within the target range for the patient.Marland Kitchen appears in no distress. Vitals Time Taken: 3:10 PM, Height: 67 in, Weight: 150 lbs, BMI: 23.5, Temperature: 98.1 F, Pulse: 62 bpm, Respiratory Rate: 18 breaths/min, Blood Pressure: 130/56 mmHg. Cardiovascular Pedal pulses palpable. General Notes: Wound exam; no mechanical debridement. The surface actually looks quite good. No evidence of surrounding infection. Edema control is excellent. Integumentary (Hair, Skin) Wound #1 status is Open. Original cause of wound was Bite. The date acquired was: 11/16/2020. The wound has been in treatment 3 weeks. The wound is located on the Right,Lateral Lower Leg. The wound measures 1.7cm length x 1cm width x 0.4cm depth; 1.335cm^2 area and 0.534cm^3 volume. There is Fat Layer (Subcutaneous Tissue) exposed. There is no tunneling or undermining noted. There is a medium amount of serosanguineous drainage noted. There is large (67-100%) red granulation within the wound bed. There is a small (1-33%) amount of necrotic tissue within the wound bed including Adherent Slough. 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 Bathing/ Shower/ Hygiene: May shower; gently cleanse wound with antibacterial soap, rinse and pat dry prior to dressing wounds -  may shower on days dressing changed, remove dressing, shower, reapply dressing Edema Control - Lymphedema / Segmental Compressive Device / Other: Tubigrip single layer applied. - side c Elevate, Exercise Daily and Avoid Standing for Long Periods of Time. Elevate legs to the level of the heart and pump ankles as often as possible Elevate leg(s) parallel to the floor when sitting. WOUND #1: - Lower Leg Wound Laterality: Right, Lateral Cleanser: Byram Ancillary Kit - 15 Day Supply (Generic) 3 x Per Week/30 Days Discharge Instructions: Use supplies as instructed; Kit contains: (15) Saline Bullets; (15) 3x3 Gauze; 15 pr Gloves Cleanser: Soap and Water 3 x Per Week/30 Days Discharge Instructions: Gently cleanse wound with antibacterial soap, rinse and pat dry prior to dressing wounds Primary Dressing: IODOFLEX 0.9% Cadexomer Iodine Pad 3 x Per Week/30 Days Discharge Instructions: Apply Iodoflex to wound bed only as directed. Secondary Dressing: ABD Pad 5x9 (in/in) (Generic) 3 x Per Week/30 Days Discharge Instructions: Cover with ABD pad Secured With: 41M Medipore H Soft Cloth Surgical Tape, 2x2 (in/yd) (Generic) 3 x Per Week/30 Days Compression Wrap: tubi grip 3 x Per Week/30 Days Discharge Instructions: size c 1. Still using Iodoflex with Tubigrip changed 3 times per week. 2. Surface looks better today. If the dimensions are unchanged next week perhaps changeo Endoform Electronic Signature(s) Signed: 01/15/2021 4:21:37 PM By: Linton Ham MD Entered By: Linton Ham on 01/15/2021 [place], [place] ([id number]) -------------------------------------------------------------------------------- SuperBill Details Patient Name:  Billy Franco Date of Service: 01/15/2021 Medical Record Number: [id number] Patient Account Number: [id number] Date of Birth/Sex: Dec 29, 1940 (80 y.o. M) Treating RN: Carlene Coria Primary Care Provider: Steele Sizer Other Clinician: Referring Provider: Steele Sizer Treating Provider/Extender: Tito Dine in Treatment: 3 Diagnosis Coding ICD-10 Codes Code Description W54.0XXA Bitten by dog, initial encounter [phone] Non-pressure chronic ulcer of other part of right lower leg with fat layer exposed E11.622 Type 2 diabetes mellitus with other skin ulcer I50.42 Chronic combined systolic (congestive) and diastolic (congestive) heart failure I48.0 Paroxysmal atrial fibrillation I10 Essential (primary) hypertension Facility Procedures CPT4 Code: [id number] Description: 99213 - WOUND CARE VISIT-LEV 3 EST PT Modifier: Quantity: 1 Physician Procedures CPT4 Code: [id number] Description: 18288 - WC PHYS LEVEL 3 - EST PT Modifier: Quantity: 1 CPT4 Code: Description: ICD-10 Diagnosis Description F37.445 Non-pressure chronic ulcer of other part of right lower leg with fat la W54.0XXA Bitten by dog, initial encounter E11.622 Type 2 diabetes mellitus with other skin ulcer Modifier: yer exposed Quantity: Electronic Signature(s) Signed: 01/15/2021 4:21:37 PM By: Linton Ham MD Previous Signature: 01/15/2021 3:41:36 PM Version By: Carlene Coria RN Entered By: Linton Ham on 01/15/2021 16:06:33

## 2021-01-18 NOTE — Progress Notes (Signed)
[name], [name] ([id number]) Visit Report for 01/15/2021 Arrival Information Details Patient Name: Billy Franco, Billy Franco Date of Service: 01/15/2021 2:45 PM Medical Record Number: [id number] Patient Account Number: [id number] Date of Birth/Sex: 08-Feb-1940 (80 y.o. M) Treating RN: Carlene Coria Primary Care Aundria Bitterman: Steele Sizer Other Clinician: Referring Lafonda Patron: Steele Sizer Treating Ransom Nickson/Extender: Tito Dine in Treatment: 3 Visit Information History Since Last Visit All ordered tests and consults were completed: No Patient Arrived: Ambulatory Added or deleted any medications: No Arrival Time: 15:08 Any new allergies or adverse reactions: No Accompanied By: wife Had a fall or experienced change in No Transfer Assistance: None activities of daily living that may affect Patient Identification Verified: Yes risk of falls: Secondary Verification Process Completed: Yes Signs or symptoms of abuse/neglect since last visito No Patient Requires Transmission-Based No Hospitalized since last visit: No Precautions: Implantable device outside of the clinic excluding No Patient Has Alerts: Yes cellular tissue based products placed in the center Patient Alerts: Patient on Blood since last visit: Thinner Has Dressing in Place as Prescribed: Yes Has Compression in Place as Prescribed: Yes Pain Present Now: No Electronic Signature(s) Signed: 01/18/2021 4:17:41 PM By: Carlene Coria RN Entered By: Carlene Coria on 01/15/2021 15:10:23 Billy Franco ([id number]) -------------------------------------------------------------------------------- Clinic Level of Care Assessment Details Patient Name: Billy Franco Date of Service: 01/15/2021 2:45 PM Medical Record Number: [id number] Patient Account Number: [id number] Date of Birth/Sex: 1940/03/22 (80 y.o. M) Treating RN: Carlene Coria Primary Care Maigan Bittinger: Steele Sizer Other Clinician: Referring Shantella Blubaugh: Steele Sizer Treating Amaal Dimartino/Extender: Tito Dine in Treatment: 3 Clinic Level of Care Assessment Items TOOL 4 Quantity Score X - Use when only an EandM is performed on FOLLOW-UP visit 1 0 ASSESSMENTS - Nursing Assessment / Reassessment X - Reassessment of Co-morbidities (includes updates in patient status) 1 10 X- 1 5 Reassessment of Adherence to Treatment Plan ASSESSMENTS - Wound and Skin Assessment / Reassessment X - Simple Wound Assessment / Reassessment - one wound 1 5 []  - 0 Complex Wound Assessment / Reassessment - multiple wounds []  - 0 Dermatologic / Skin Assessment (not related to wound area) ASSESSMENTS - Focused Assessment []  - Circumferential Edema Measurements - multi extremities 0 []  - 0 Nutritional Assessment / Counseling / Intervention []  - 0 Lower Extremity Assessment (monofilament, tuning fork, pulses) []  - 0 Peripheral Arterial Disease Assessment (using hand held doppler) ASSESSMENTS - Ostomy and/or Continence Assessment and Care []  - Incontinence Assessment and Management 0 []  - 0 Ostomy Care Assessment and Management (repouching, etc.) PROCESS - Coordination of Care X - Simple Patient / Family Education for ongoing care 1 15 []  - 0 Complex (extensive) Patient / Family Education for ongoing care []  - 0 Staff obtains Programmer, systems, Records, Test Results / Process Orders []  - 0 Staff telephones HHA, Nursing Homes / Clarify orders / etc []  - 0 Routine Transfer to another Facility (non-emergent condition) []  - 0 Routine Hospital Admission (non-emergent condition) []  - 0 New Admissions / Biomedical engineer / Ordering NPWT, Apligraf, etc. []  - 0 Emergency Hospital Admission (emergent condition) X- 1 10 Simple Discharge Coordination []  - 0 Complex (extensive) Discharge Coordination PROCESS - Special Needs []  - Pediatric / Minor Patient Management 0 []  - 0 Isolation Patient Management []  - 0 Hearing / Language / Visual special needs []  -  0 Assessment of Community assistance (transportation, D/C planning, etc.) []  - 0 Additional assistance / Altered mentation []  - 0 Support Surface(s) Assessment (bed, cushion, seat, etc.) INTERVENTIONS - Wound Cleansing / Measurement  Billy Franco, Billy Franco ([id number]) X- 1 5 Simple Wound Cleansing - one wound []  - 0 Complex Wound Cleansing - multiple wounds X- 1 5 Wound Imaging (photographs - any number of wounds) []  - 0 Wound Tracing (instead of photographs) X- 1 5 Simple Wound Measurement - one wound []  - 0 Complex Wound Measurement - multiple wounds INTERVENTIONS - Wound Dressings X - Small Wound Dressing one or multiple wounds 1 10 []  - 0 Medium Wound Dressing one or multiple wounds []  - 0 Large Wound Dressing one or multiple wounds X- 1 5 Application of Medications - topical []  - 0 Application of Medications - injection INTERVENTIONS - Miscellaneous []  - External ear exam 0 []  - 0 Specimen Collection (cultures, biopsies, blood, body fluids, etc.) []  - 0 Specimen(s) / Culture(s) sent or taken to Lab for analysis []  - 0 Patient Transfer (multiple staff / Civil Service fast streamer / Similar devices) []  - 0 Simple Staple / Suture removal (25 or less) []  - 0 Complex Staple / Suture removal (26 or more) []  - 0 Hypo / Hyperglycemic Management (close monitor of Blood Glucose) []  - 0 Ankle / Brachial Index (ABI) - do not check if billed separately X- 1 5 Vital Signs Has the patient been seen at the hospital within the last three years: Yes Total Score: 80 Level Of Care: New/Established - Level 3 Electronic Signature(s) Signed: 01/18/2021 4:17:41 PM By: Carlene Coria RN Entered By: Carlene Coria on 01/15/2021 15:41:16 Billy Franco ([id number]) -------------------------------------------------------------------------------- Encounter Discharge Information Details Patient Name: Billy Franco Date of Service: 01/15/2021 2:45 PM Medical Record Number: [id number] Patient Account Number:  [id number] Date of Birth/Sex: February 05, 1940 (80 y.o. M) Treating RN: Carlene Coria Primary Care Robynne Roat: Steele Sizer Other Clinician: Referring Richard Ritchey: Steele Sizer Treating Obie Silos/Extender: Tito Dine in Treatment: 3 Encounter Discharge Information Items Discharge Condition: Stable Ambulatory Status: Ambulatory Discharge Destination: Home Transportation: Private Auto Accompanied By: self Schedule Follow-up Appointment: Yes Clinical Summary of Care: Patient Declined Electronic Signature(s) Signed: 01/15/2021 3:42:28 PM By: Carlene Coria RN Entered By: Carlene Coria on 01/15/2021 15:42:27 Billy Franco ([id number]) -------------------------------------------------------------------------------- Lower Extremity Assessment Details Patient Name: Billy Franco Date of Service: 01/15/2021 2:45 PM Medical Record Number: [id number] Patient Account Number: [id number] Date of Birth/Sex: 08-04-1940 (80 y.o. M) Treating RN: Carlene Coria Primary Care Elah Avellino: Steele Sizer Other Clinician: Referring Shivansh Hardaway: Steele Sizer Treating Deloise Marchant/Extender: Tito Dine in Treatment: 3 Edema Assessment Assessed: [Left: Yes] [Right: No] Edema: [Left: Ye] [Right: s] Calf Left: Right: Point of Measurement: 36 cm From Medial Instep 31 cm Ankle Left: Right: Point of Measurement: 10 cm From Medial Instep 22 cm Vascular Assessment Pulses: Dorsalis Pedis Palpable: [Right:Yes] Electronic Signature(s) Signed: 01/18/2021 4:17:41 PM By: Carlene Coria RN Entered By: Carlene Coria on 01/15/2021 15:18:54 Billy Franco ([id number]) -------------------------------------------------------------------------------- Multi Wound Chart Details Patient Name: Billy Franco Date of Service: 01/15/2021 2:45 PM Medical Record Number: [id number] Patient Account Number: [id number] Date of Birth/Sex: [birth year] (80 y.o. M) Treating RN: Carlene Coria Primary Care Quenisha Lovins: Steele Sizer Other Clinician: Referring Lateesha Bezold: Steele Sizer Treating Arda Keadle/Extender: Tito Dine in Treatment: 3 Vital Signs Height(in): 67 Pulse(bpm): 45 Weight(lbs): 150 Blood Pressure(mmHg): 130/56 Body Mass Index(BMI): 23 Temperature(F): 98.1 Respiratory Rate(breaths/min): 18 Photos: [N/A:N/A] Wound Location: Right, Lateral Lower Leg N/A N/A Wounding Event: Bite N/A N/A Primary Etiology: Diabetic Wound/Ulcer of the Lower N/A N/A Extremity Comorbid History: Congestive Heart Failure, N/A N/A Hypertension, Type II Diabetes Date Acquired: 11/16/2020 N/A N/A Weeks of Treatment: 3 N/A N/A Wound Status:  Open N/A N/A Measurements L x W x D (cm) 1.7x1x0.4 N/A N/A Area (cm) : 1.335 N/A N/A Volume (cm) : 0.534 N/A N/A % Reduction in Area: 29.20% N/A N/A % Reduction in Volume: 43.30% N/A N/A Classification: Grade 2 N/A N/A Exudate Amount: Medium N/A N/A Exudate Type: Serosanguineous N/A N/A Exudate Color: red, brown N/A N/A Granulation Amount: Large (67-100%) N/A N/A Granulation Quality: Red N/A N/A Necrotic Amount: Small (1-33%) N/A N/A Exposed Structures: Fat Layer (Subcutaneous Tissue): N/A N/A Yes Fascia: No Tendon: No Muscle: No Joint: No Bone: No Epithelialization: None N/A N/A Treatment Notes Electronic Signature(s) Signed: 01/18/2021 4:17:41 PM By: Carlene Coria RN Entered By: Carlene Coria on 01/15/2021 15:31:43 Billy Franco ([id number]) -------------------------------------------------------------------------------- Multi-Disciplinary Care Plan Details Patient Name: Billy Franco Date of Service: 01/15/2021 2:45 PM Medical Record Number: [id number] Patient Account Number: [id number] Date of Birth/Sex: 09/09/1940 (80 y.o. M) Treating RN: Carlene Coria Primary Care Shatha Hooser: Steele Sizer Other Clinician: Referring Maven Varelas: Steele Sizer Treating Estanislado Surgeon/Extender: Tito Dine in Treatment: 3 Active Inactive Wound/Skin  Impairment Nursing Diagnoses: Knowledge deficit related to ulceration/compromised skin integrity Goals: Patient/caregiver will verbalize understanding of skin care regimen Date Initiated: 12/24/2020 Target Resolution Date: 01/24/2021 Goal Status: Active Ulcer/skin breakdown will have a volume reduction of 30% by week 4 Date Initiated: 12/24/2020 Target Resolution Date: 02/24/2021 Goal Status: Active Ulcer/skin breakdown will have a volume reduction of 50% by week 8 Date Initiated: 12/24/2020 Target Resolution Date: 03/24/2021 Goal Status: Active Ulcer/skin breakdown will have a volume reduction of 80% by week 12 Date Initiated: 12/24/2020 Target Resolution Date: 04/24/2021 Goal Status: Active Ulcer/skin breakdown will heal within 14 weeks Date Initiated: 12/24/2020 Target Resolution Date: 05/24/2021 Goal Status: Active Interventions: Assess patient/caregiver ability to obtain necessary supplies Assess patient/caregiver ability to perform ulcer/skin care regimen upon admission and as needed Assess ulceration(s) every visit Notes: Electronic Signature(s) Signed: 01/18/2021 4:17:41 PM By: Carlene Coria RN Entered By: Carlene Coria on 01/15/2021 15:31:17 Billy Franco ([id number]) -------------------------------------------------------------------------------- Pain Assessment Details Patient Name: Billy Franco Date of Service: 01/15/2021 2:45 PM Medical Record Number: [id number] Patient Account Number: [id number] Date of Birth/Sex: [birth year] (80 y.o. M) Treating RN: Carlene Coria Primary Care Afua Hoots: Steele Sizer Other Clinician: Referring Marsel Gail: Steele Sizer Treating Saori Umholtz/Extender: Tito Dine in Treatment: 3 Active Problems Location of Pain Severity and Description of Pain Patient Has Paino No Site Locations Pain Management and Medication Current Pain Management: Electronic Signature(s) Signed: 01/18/2021 4:17:41 PM By: Carlene Coria RN Entered By: Carlene Coria on 01/15/2021 15:11:09 Billy Franco ([id number]) -------------------------------------------------------------------------------- Patient/Caregiver Education Details Patient Name: Billy Franco Date of Service: 01/15/2021 2:45 PM Medical Record Number: [id number] Patient Account Number: [id number] Date of Birth/Gender: [birth year] (80 y.o. M) Treating RN: Carlene Coria Primary Care Physician: Steele Sizer Other Clinician: Referring Physician: Steele Sizer Treating Physician/Extender: Tito Dine in Treatment: 3 Education Assessment Education Provided To: Patient Education Topics Provided Wound/Skin Impairment: Methods: Explain/Verbal Responses: State content correctly Electronic Signature(s) Signed: 01/18/2021 4:17:41 PM By: Carlene Coria RN Entered By: Carlene Coria on 01/15/2021 15:55:30 Billy Franco ([id number]) -------------------------------------------------------------------------------- Wound Assessment Details Patient Name: Billy Franco Date of Service: 01/15/2021 2:45 PM Medical Record Number: [id number] Patient Account Number: [id number] Date of Birth/Sex: Jun 01, 1940 (80 y.o. M) Treating RN: Carlene Coria Primary Care Trenyce Loera: Steele Sizer Other Clinician: Referring Chantele Corado: Steele Sizer Treating Aki Abalos/Extender: Tito Dine in Treatment: 3 Wound Status Wound Number: 1 Primary Etiology: Diabetic Wound/Ulcer of the Lower Extremity Wound Location: Right, Lateral Lower Leg Wound Status: Open  Wounding Event: Bite Comorbid Congestive Heart Failure, Hypertension, Type II History: Diabetes Date Acquired: 11/16/2020 Weeks Of Treatment: 3 Clustered Wound: No Photos Wound Measurements Length: (cm) 1.7 Width: (cm) 1 Depth: (cm) 0.4 Area: (cm) 1.335 Volume: (cm) 0.534 % Reduction in Area: 29.2% % Reduction in Volume: 43.3% Epithelialization: None Tunneling: No Undermining: No Wound Description Classification:  Grade 2 Exudate Amount: Medium Exudate Type: Serosanguineous Exudate Color: red, brown Foul Odor After Cleansing: No Slough/Fibrino Yes Wound Bed Granulation Amount: Large (67-100%) Exposed Structure Granulation Quality: Red Fascia Exposed: No Necrotic Amount: Small (1-33%) Fat Layer (Subcutaneous Tissue) Exposed: Yes Necrotic Quality: Adherent Slough Tendon Exposed: No Muscle Exposed: No Joint Exposed: No Bone Exposed: No Treatment Notes Wound #1 (Lower Leg) Wound Laterality: Right, Lateral Cleanser [place] Ancillary Kit - 15 Day Supply Discharge Instruction: Use supplies as instructed; Kit contains: (15) Saline Bullets; (15) 3x3 Gauze; 15 pr Gloves Soap and Water Discharge Instruction: Gently cleanse wound with antibacterial soap, rinse and pat dry prior to dressing wounds Billy Franco, Billy Franco ([id number]) Peri-Wound Care Topical Primary Dressing IODOFLEX 0.9% Cadexomer Iodine Pad Discharge Instruction: Apply Iodoflex to wound bed only as directed. Secondary Dressing ABD Pad 5x9 (in/in) Discharge Instruction: Cover with ABD pad Secured With 25M [place] Surgical Tape, 2x2 (in/yd) Compression Wrap tubi grip Discharge Instruction: size c Compression Stockings Add-Ons Electronic Signature(s) Signed: 01/18/2021 4:17:41 PM By: Carlene Coria RN Entered By: Carlene Coria on 01/15/2021 15:18:04 Billy Franco ([id number]) -------------------------------------------------------------------------------- Vitals Details Patient Name: Billy Franco Date of Service: 01/15/2021 2:45 PM Medical Record Number: [id number] Patient Account Number: [id number] Date of Birth/Sex: September 10, 1940 (80 y.o. M) Treating RN: Carlene Coria Primary Care Saray Capasso: Steele Sizer Other Clinician: Referring Willamina Grieshop: Steele Sizer Treating Turquoise Esch/Extender: Tito Dine in Treatment: 3 Vital Signs Time Taken: 15:10 Temperature (F): 98.1 Height (in): 67 Pulse (bpm): 62 Weight  (lbs): 150 Respiratory Rate (breaths/min): 18 Body Mass Index (BMI): 23.5 Blood Pressure (mmHg): 130/56 Reference Range: 80 - 120 mg / dl Electronic Signature(s) Signed: 01/18/2021 4:17:41 PM By: Carlene Coria RN Entered By: Carlene Coria on 01/15/2021 15:10:44

## 2021-01-24 ENCOUNTER — Encounter: Payer: Medicare Other | Attending: Physician Assistant | Admitting: Physician Assistant

## 2021-01-24 ENCOUNTER — Other Ambulatory Visit: Payer: Self-pay

## 2021-01-24 DIAGNOSIS — E11622 Type 2 diabetes mellitus with other skin ulcer: Secondary | ICD-10-CM | POA: Insufficient documentation

## 2021-01-24 DIAGNOSIS — W540XXA Bitten by dog, initial encounter: Secondary | ICD-10-CM | POA: Diagnosis not present

## 2021-01-24 DIAGNOSIS — L97812 Non-pressure chronic ulcer of other part of right lower leg with fat layer exposed: Secondary | ICD-10-CM | POA: Insufficient documentation

## 2021-01-24 DIAGNOSIS — I5042 Chronic combined systolic (congestive) and diastolic (congestive) heart failure: Secondary | ICD-10-CM | POA: Insufficient documentation

## 2021-01-24 DIAGNOSIS — I480 Paroxysmal atrial fibrillation: Secondary | ICD-10-CM | POA: Insufficient documentation

## 2021-01-24 DIAGNOSIS — S81851A Open bite, right lower leg, initial encounter: Secondary | ICD-10-CM | POA: Diagnosis not present

## 2021-01-24 DIAGNOSIS — I110 Hypertensive heart disease with heart failure: Secondary | ICD-10-CM | POA: Diagnosis not present

## 2021-01-24 DIAGNOSIS — I10 Essential (primary) hypertension: Secondary | ICD-10-CM | POA: Diagnosis not present

## 2021-01-24 NOTE — Progress Notes (Addendum)
KAMERON, BLETHEN ([id number]) Visit Report for 01/24/2021 Chief Complaint Document Details Patient Name: Billy Franco, Billy Franco Date of Service: 01/24/2021 2:00 PM Medical Record Number: [id number] Patient Account Number: [id number] Date of Birth/Sex: 24-Mar-1940 (81 y.o. M) Treating RN: Carlene Coria Primary Care Provider: Steele Sizer Other Clinician: Referring Provider: Steele Sizer Treating Provider/Extender: Skipper Cliche in Treatment: 4 Information Obtained from: Patient Chief Complaint Right Leg Dog Bite Electronic Signature(s) Signed: 01/24/2021 1:53:49 PM By: Worthy Keeler PA-C Entered By: Worthy Keeler on 01/24/2021 13:53:48 Billy Franco ([id number]) -------------------------------------------------------------------------------- HPI Details Patient Name: Billy Franco Date of Service: 01/24/2021 2:00 PM Medical Record Number: [id number] Patient Account Number: [id number] Date of Birth/Sex: 1940/02/20 (80 y.o. M) Treating RN: Carlene Coria Primary Care Provider: Steele Sizer Other Clinician: Referring Provider: Steele Sizer Treating Provider/Extender: Skipper Cliche in Treatment: 4 History of Present Illness HPI Description: 12/24/2020 upon evaluation today patient appears to be doing somewhat poorly in regard to the wound that actually occurred around November 13 2020. This was actually a dog bite he is unsure of what type of dog it was it was a small wound that wound was not a familiar dog to him. Subsequently he ended up being seen by his primary care provider in October 28 by his primary care provider. Subsequently they originally placed him on 2 rounds of amoxicillin he is also been on doxycycline. Unfortunately this just does not seem to be getting better despite the treatment up to this point they have been initially cleaning with alcohol although that has been discontinued when he went to the ER on November 14. They actually recommended cleaning with just soap  and water. There is really not been any specific dressings that were recommended at this time. Subsequently the patient does have an ABI of 1.3 here in the clinic today. This is on the right. Subsequently also has a recent hemoglobin A1c from when he was at the ER of 6.3. He really is not having any significant pain which is good news he does have some swelling which is going to need to be addressed as well. This is minimal however I do not think he has to have a compression wrap I think Tubigrip would probably be appropriate. Patient does have a history of diabetes mellitus type 2, congestive heart failure, atrial fibrillation, and hypertension. 01/01/2021 upon evaluation today patient appears to be doing pretty well in regard to his wound. Fortunately I do not see any signs of infection and in 1 week's time I feel like we have made some good progress here. No fevers, chills, nausea, vomiting, or diarrhea. 01/08/2021 upon evaluation today patient's wound at this point is showing signs of doing really about the same. I am not seeing as much improvement as I would like to see. I think at this point that I would recommend we see about actually using a different medication I am thinking Iodosorb could be beneficial for him. He is in agreement with given something else to try here. 12/27; use Iodoflex starting last week. Wound surface looks better but no major change in dimensions 01/24/2021 upon evaluation today patient appears to be doing well with regard to his leg ulcer. I am actually seeing signs of significant improvement and very pleased in this regard. I do not see any evidence of active infection which is great news. No fevers, chills, nausea, vomiting, or diarrhea. Electronic Signature(s) Signed: 01/24/2021 5:15:16 PM By: Worthy Keeler PA-C Entered By: Worthy Keeler on 01/24/2021 17:15:15 Human,  Claris Gladden  ([id number]) -------------------------------------------------------------------------------- Physical Exam Details Patient Name: Billy Franco Date of Service: 01/24/2021 2:00 PM Medical Record Number: [id number] Patient Account Number: [id number] Date of Birth/Sex: 03-Feb-1940 (81 y.o. M) Treating RN: Carlene Coria Primary Care Provider: Steele Sizer Other Clinician: Referring Provider: Steele Sizer Treating Provider/Extender: Skipper Cliche in Treatment: 4 Constitutional Well-nourished and well-hydrated in no acute distress. Respiratory normal breathing without difficulty. Psychiatric this patient is able to make decisions and demonstrates good insight into disease process. Alert and Oriented x 3. pleasant and cooperative. Notes Upon inspection patient's wound bed shows to be very clean I do not see any evidence of need for sharp debridement today which is also I do believe the Iodoflex is doing a great job for him. Electronic Signature(s) Signed: 01/24/2021 5:15:33 PM By: Worthy Keeler PA-C Entered By: Worthy Keeler on 01/24/2021 17:15:33 Billy Franco ([id number]) -------------------------------------------------------------------------------- Physician Orders Details Patient Name: Billy Franco Date of Service: 01/24/2021 2:00 PM Medical Record Number: [id number] Patient Account Number: [id number] Date of Birth/Sex: [birth year] (80 y.o. M) Treating RN: Carlene Coria Primary Care Provider: Steele Sizer Other Clinician: Referring Provider: Steele Sizer Treating Provider/Extender: Skipper Cliche in Treatment: 4 Verbal / Phone Orders: No Diagnosis Coding ICD-10 Coding Code Description W54.0XXA Bitten by dog, initial encounter [phone] Non-pressure chronic ulcer of other part of right lower leg with fat layer exposed E11.622 Type 2 diabetes mellitus with other skin ulcer I50.42 Chronic combined systolic (congestive) and diastolic (congestive) heart  failure I48.0 Paroxysmal atrial fibrillation I10 Essential (primary) hypertension Follow-up Appointments o Return Appointment in 1 week. Bathing/ Shower/ Hygiene o May shower; gently cleanse wound with antibacterial soap, rinse and pat dry prior to dressing wounds - may shower on days dressing changed, remove dressing, shower, reapply dressing Edema Control - Lymphedema / Segmental Compressive Device / Other o Tubigrip single layer applied. - side c o Elevate, Exercise Daily and Avoid Standing for Long Periods of Time. o Elevate legs to the level of the heart and pump ankles as often as possible o Elevate leg(s) parallel to the floor when sitting. Wound Treatment Wound #1 - Lower Leg Wound Laterality: Right, Lateral Cleanser: Byram Ancillary Kit - 15 Day Supply (Generic) 3 x Per Week/30 Days Discharge Instructions: Use supplies as instructed; Kit contains: (15) Saline Bullets; (15) 3x3 Gauze; 15 pr Gloves Cleanser: Soap and Water 3 x Per Week/30 Days Discharge Instructions: Gently cleanse wound with antibacterial soap, rinse and pat dry prior to dressing wounds Primary Dressing: IODOFLEX 0.9% Cadexomer Iodine Pad 3 x Per Week/30 Days Discharge Instructions: Apply Iodoflex to wound bed only as directed. Secondary Dressing: ABD Pad 5x9 (in/in) (Generic) 3 x Per Week/30 Days Discharge Instructions: Cover with ABD pad Secured With: 31M Medipore H Soft Cloth Surgical Tape, 2x2 (in/yd) (Generic) 3 x Per Week/30 Days Compression Wrap: tubi grip 3 x Per Week/30 Days Discharge Instructions: size c Electronic Signature(s) Signed: 01/24/2021 4:38:58 PM By: Carlene Coria RN Signed: 01/25/2021 5:21:10 PM By: Worthy Keeler PA-C Entered By: Carlene Coria on 01/24/2021 16:38:58 Billy Franco ([id number]) -------------------------------------------------------------------------------- Problem List Details Patient Name: Billy Franco Date of Service: 01/24/2021 2:00 PM Medical Record Number:  [id number] Patient Account Number: [id number] Date of Birth/Sex: 13-Jan-1941 (80 y.o. M) Treating RN: Carlene Coria Primary Care Provider: Steele Sizer Other Clinician: Referring Provider: Steele Sizer Treating Provider/Extender: Skipper Cliche in Treatment: 4 Active Problems ICD-10 Encounter Code Description Active Date MDM Diagnosis W54.0XXA Bitten by dog, initial encounter 12/24/2020 No Yes L97.812 Non-pressure chronic ulcer  of other part of right lower leg with fat layer 12/24/2020 No Yes exposed E11.622 Type 2 diabetes mellitus with other skin ulcer 12/24/2020 No Yes I50.42 Chronic combined systolic (congestive) and diastolic (congestive) heart 12/24/2020 No Yes failure I48.0 Paroxysmal atrial fibrillation 12/24/2020 No Yes I10 Essential (primary) hypertension 12/24/2020 No Yes Inactive Problems Resolved Problems Electronic Signature(s) Signed: 01/24/2021 1:53:37 PM By: Worthy Keeler PA-C Entered By: Worthy Keeler on 01/24/2021 13:53:37 Billy Franco ([id number]) -------------------------------------------------------------------------------- Progress Note Details Patient Name: Billy Franco Date of Service: 01/24/2021 2:00 PM Medical Record Number: [id number] Patient Account Number: [id number] Date of Birth/Sex: Jul 28, 1940 (80 y.o. M) Treating RN: Carlene Coria Primary Care Provider: Steele Sizer Other Clinician: Referring Provider: Steele Sizer Treating Provider/Extender: Skipper Cliche in Treatment: 4 Subjective Chief Complaint Information obtained from Patient Right Leg Dog Bite History of Present Illness (HPI) 12/24/2020 upon evaluation today patient appears to be doing somewhat poorly in regard to the wound that actually occurred around November 13 2020. This was actually a dog bite he is unsure of what type of dog it was it was a small wound that wound was not a familiar dog to him. Subsequently he ended up being seen by his primary care provider in  October 28 by his primary care provider. Subsequently they originally placed him on 2 rounds of amoxicillin he is also been on doxycycline. Unfortunately this just does not seem to be getting better despite the treatment up to this point they have been initially cleaning with alcohol although that has been discontinued when he went to the ER on November 14. They actually recommended cleaning with just soap and water. There is really not been any specific dressings that were recommended at this time. Subsequently the patient does have an ABI of 1.3 here in the clinic today. This is on the right. Subsequently also has a recent hemoglobin A1c from when he was at the ER of 6.3. He really is not having any significant pain which is good news he does have some swelling which is going to need to be addressed as well. This is minimal however I do not think he has to have a compression wrap I think Tubigrip would probably be appropriate. Patient does have a history of diabetes mellitus type 2, congestive heart failure, atrial fibrillation, and hypertension. 01/01/2021 upon evaluation today patient appears to be doing pretty well in regard to his wound. Fortunately I do not see any signs of infection and in 1 week's time I feel like we have made some good progress here. No fevers, chills, nausea, vomiting, or diarrhea. 01/08/2021 upon evaluation today patient's wound at this point is showing signs of doing really about the same. I am not seeing as much improvement as I would like to see. I think at this point that I would recommend we see about actually using a different medication I am thinking Iodosorb could be beneficial for him. He is in agreement with given something else to try here. 12/27; use Iodoflex starting last week. Wound surface looks better but no major change in dimensions 01/24/2021 upon evaluation today patient appears to be doing well with regard to his leg ulcer. I am actually seeing signs of  significant improvement and very pleased in this regard. I do not see any evidence of active infection which is great news. No fevers, chills, nausea, vomiting, or diarrhea. Objective Constitutional Well-nourished and well-hydrated in no acute distress. Vitals Time Taken: 2:24 PM, Height: 67 in, Weight: 150 lbs,  BMI: 23.5, Temperature: 98.1 F, Pulse: 77 bpm, Respiratory Rate: 16 breaths/min, Blood Pressure: 110/62 mmHg. Respiratory normal breathing without difficulty. Psychiatric this patient is able to make decisions and demonstrates good insight into disease process. Alert and Oriented x 3. pleasant and cooperative. General Notes: Upon inspection patient's wound bed shows to be very clean I do not see any evidence of need for sharp debridement today which is also I do believe the Iodoflex is doing a great job for him. Integumentary (Hair, Skin) Wound #1 status is Open. Original cause of wound was Bite. The date acquired was: 11/16/2020. The wound has been in treatment 4 weeks. The wound is located on the Right,Lateral Lower Leg. The wound measures 1.7cm length x 1cm width x 0.3cm depth; 1.335cm^2 area and 0.401cm^3 volume. There is Fat Layer (Subcutaneous Tissue) exposed. There is no tunneling or undermining noted. There is a medium amount of serosanguineous drainage noted. There is large (67-100%) red granulation within the wound bed. There is a small (1-33%) amount of necrotic Ranieri, Ellwood ([id number]) tissue within the wound bed including Adherent Slough. Assessment Active Problems ICD-10 Bitten by dog, initial encounter Non-pressure chronic ulcer of other part of right lower leg with fat layer exposed Type 2 diabetes mellitus with other skin ulcer Chronic combined systolic (congestive) and diastolic (congestive) heart failure Paroxysmal atrial fibrillation Essential (primary) hypertension Plan Follow-up Appointments: Return Appointment in 1 week. Bathing/ Shower/  Hygiene: May shower; gently cleanse wound with antibacterial soap, rinse and pat dry prior to dressing wounds - may shower on days dressing changed, remove dressing, shower, reapply dressing Edema Control - Lymphedema / Segmental Compressive Device / Other: Tubigrip single layer applied. - side c Elevate, Exercise Daily and Avoid Standing for Long Periods of Time. Elevate legs to the level of the heart and pump ankles as often as possible Elevate leg(s) parallel to the floor when sitting. WOUND #1: - Lower Leg Wound Laterality: Right, Lateral Cleanser: Byram Ancillary Kit - 15 Day Supply (Generic) 3 x Per Week/30 Days Discharge Instructions: Use supplies as instructed; Kit contains: (15) Saline Bullets; (15) 3x3 Gauze; 15 pr Gloves Cleanser: Soap and Water 3 x Per Week/30 Days Discharge Instructions: Gently cleanse wound with antibacterial soap, rinse and pat dry prior to dressing wounds Primary Dressing: IODOFLEX 0.9% Cadexomer Iodine Pad 3 x Per Week/30 Days Discharge Instructions: Apply Iodoflex to wound bed only as directed. Secondary Dressing: ABD Pad 5x9 (in/in) (Generic) 3 x Per Week/30 Days Discharge Instructions: Cover with ABD pad Secured With: 32M Medipore H Soft Cloth Surgical Tape, 2x2 (in/yd) (Generic) 3 x Per Week/30 Days Compression Wrap: tubi grip 3 x Per Week/30 Days Discharge Instructions: size c 1. I am going to suggest that we go ahead and continue with the Iodoflex at this time since he is doing so well the patient is in agreement the plan is watched on a great job taking care of this. 2. We will continue to cover with dry gauze secured with tape and following this they are also using the Tubigrip which I think is doing awesome for him. We will see patient back for reevaluation in 1 week here in the clinic. If anything worsens or changes patient will contact our office for additional recommendations. Electronic Signature(s) Signed: 01/24/2021 5:15:52 PM By: Worthy Keeler PA-C Entered By: Worthy Keeler on 01/24/2021 17:15:51 Billy Franco ([id number]) -------------------------------------------------------------------------------- SuperBill Details Patient Name: Billy Franco Date of Service: 01/24/2021 Medical Record Number: [id number] Patient Account Number: [id number] Date of  Birth/Sex: [birth year] (81 y.o. M) Treating RN: Carlene Coria Primary Care Provider: Steele Sizer Other Clinician: Referring Provider: Steele Sizer Treating Provider/Extender: Skipper Cliche in Treatment: 4 Diagnosis Coding ICD-10 Codes Code Description [phone] Bitten by dog, initial encounter [phone] Non-pressure chronic ulcer of other part of right lower leg with fat layer exposed E11.622 Type 2 diabetes mellitus with other skin ulcer I50.42 Chronic combined systolic (congestive) and diastolic (congestive) heart failure I48.0 Paroxysmal atrial fibrillation I10 Essential (primary) hypertension Facility Procedures CPT4 Code: [id number] Description: 99213 - WOUND CARE VISIT-LEV 3 EST PT Modifier: Quantity: 1 Physician Procedures CPT4 Code: [id number] Description: 61607 - WC PHYS LEVEL 3 - EST PT Modifier: Quantity: 1 CPT4 Code: Description: ICD-10 Diagnosis Description W54.0XXA Bitten by dog, initial encounter [phone] Non-pressure chronic ulcer of other part of right lower leg with fat la E11.622 Type 2 diabetes mellitus with other skin ulcer I50.42 Chronic combined systolic  (congestive) and diastolic (congestive) heart Modifier: yer exposed failure Quantity: Electronic Signature(s) Signed: 01/24/2021 5:25:05 PM By: Worthy Keeler PA-C Previous Signature: 01/24/2021 4:39:48 PM Version By: Carlene Coria RN Entered By: Worthy Keeler on 01/24/2021 17:25:05

## 2021-01-24 NOTE — Progress Notes (Addendum)
HOSIE, SHARMAN ([id number]) Visit Report for 01/24/2021 Arrival Information Details Patient Name: Billy, Franco Date of Service: 01/24/2021 2:00 PM Medical Record Number: [id number] Patient Account Number: [id number] Date of Birth/Sex: [birth year] (81 y.o. M) Treating RN: Carlene Coria Primary Care Orla Estrin: Steele Sizer Other Clinician: Referring Domani Bakos: Steele Sizer Treating Tasha Jindra/Extender: Skipper Cliche in Treatment: 4 Visit Information History Since Last Visit All ordered tests and consults were completed: No Patient Arrived: Ambulatory Added or deleted any medications: No Arrival Time: 14:19 Any new allergies or adverse reactions: No Accompanied By: wife Had a fall or experienced change in No Transfer Assistance: None activities of daily living that may affect Patient Identification Verified: Yes risk of falls: Secondary Verification Process Completed: Yes Signs or symptoms of abuse/neglect since last visito No Patient Requires Transmission-Based No Hospitalized since last visit: No Precautions: Implantable device outside of the clinic excluding No Patient Has Alerts: Yes cellular tissue based products placed in the center Patient Alerts: Patient on Blood since last visit: Thinner Has Dressing in Place as Prescribed: Yes Pain Present Now: No Electronic Signature(s) Signed: 01/25/2021 4:33:01 PM By: Carlene Coria RN Entered By: Carlene Coria on 01/24/2021 14:23:59 Billy Franco ([id number]) -------------------------------------------------------------------------------- Clinic Level of Care Assessment Details Patient Name: Billy Franco Date of Service: 01/24/2021 2:00 PM Medical Record Number: [id number] Patient Account Number: [id number] Date of Birth/Sex: 03-19-1940 (80 y.o. M) Treating RN: Carlene Coria Primary Care Leannah Guse: Steele Sizer Other Clinician: Referring Ronav Furney: Steele Sizer Treating Kmya Placide/Extender: Skipper Cliche in Treatment:  4 Clinic Level of Care Assessment Items TOOL 4 Quantity Score X - Use when only an EandM is performed on FOLLOW-UP visit 1 0 ASSESSMENTS - Nursing Assessment / Reassessment X - Reassessment of Co-morbidities (includes updates in patient status) 1 10 X- 1 5 Reassessment of Adherence to Treatment Plan ASSESSMENTS - Wound and Skin Assessment / Reassessment X - Simple Wound Assessment / Reassessment - one wound 1 5 []  - 0 Complex Wound Assessment / Reassessment - multiple wounds []  - 0 Dermatologic / Skin Assessment (not related to wound area) ASSESSMENTS - Focused Assessment []  - Circumferential Edema Measurements - multi extremities 0 []  - 0 Nutritional Assessment / Counseling / Intervention []  - 0 Lower Extremity Assessment (monofilament, tuning fork, pulses) []  - 0 Peripheral Arterial Disease Assessment (using hand held doppler) ASSESSMENTS - Ostomy and/or Continence Assessment and Care []  - Incontinence Assessment and Management 0 []  - 0 Ostomy Care Assessment and Management (repouching, etc.) PROCESS - Coordination of Care X - Simple Patient / Family Education for ongoing care 1 15 []  - 0 Complex (extensive) Patient / Family Education for ongoing care []  - 0 Staff obtains Programmer, systems, Records, Test Results / Process Orders []  - 0 Staff telephones HHA, Nursing Homes / Clarify orders / etc []  - 0 Routine Transfer to another Facility (non-emergent condition) []  - 0 Routine Hospital Admission (non-emergent condition) []  - 0 New Admissions / Biomedical engineer / Ordering NPWT, Apligraf, etc. []  - 0 Emergency Hospital Admission (emergent condition) X- 1 10 Simple Discharge Coordination []  - 0 Complex (extensive) Discharge Coordination PROCESS - Special Needs []  - Pediatric / Minor Patient Management 0 []  - 0 Isolation Patient Management []  - 0 Hearing / Language / Visual special needs []  - 0 Assessment of Community assistance (transportation, D/C planning,  etc.) []  - 0 Additional assistance / Altered mentation []  - 0 Support Surface(s) Assessment (bed, cushion, seat, etc.) INTERVENTIONS - Wound Cleansing / Measurement Billy, Franco ([id number]) X- 1 5 Simple Wound Cleansing -  one wound []  - 0 Complex Wound Cleansing - multiple wounds X- 1 5 Wound Imaging (photographs - any number of wounds) []  - 0 Wound Tracing (instead of photographs) X- 1 5 Simple Wound Measurement - one wound []  - 0 Complex Wound Measurement - multiple wounds INTERVENTIONS - Wound Dressings X - Small Wound Dressing one or multiple wounds 1 10 []  - 0 Medium Wound Dressing one or multiple wounds []  - 0 Large Wound Dressing one or multiple wounds X- 1 5 Application of Medications - topical []  - 0 Application of Medications - injection INTERVENTIONS - Miscellaneous []  - External ear exam 0 []  - 0 Specimen Collection (cultures, biopsies, blood, body fluids, etc.) []  - 0 Specimen(s) / Culture(s) sent or taken to Lab for analysis []  - 0 Patient Transfer (multiple staff / Civil Service fast streamer / Similar devices) []  - 0 Simple Staple / Suture removal (25 or less) []  - 0 Complex Staple / Suture removal (26 or more) []  - 0 Hypo / Hyperglycemic Management (close monitor of Blood Glucose) []  - 0 Ankle / Brachial Index (ABI) - do not check if billed separately X- 1 5 Vital Signs Has the patient been seen at the hospital within the last three years: Yes Total Score: 80 Level Of Care: New/Established - Level 3 Electronic Signature(s) Signed: 01/25/2021 4:33:01 PM By: Carlene Coria RN Entered By: Carlene Coria on 01/24/2021 16:39:34 Billy Franco ([id number]) -------------------------------------------------------------------------------- Encounter Discharge Information Details Patient Name: Billy Franco Date of Service: 01/24/2021 2:00 PM Medical Record Number: [id number] Patient Account Number: [id number] Date of Birth/Sex: 08/11/1940 (80 y.o. M) Treating RN: Carlene Coria Primary Care Cadence Haslam: Steele Sizer Other Clinician: Referring Nalanie Winiecki: Steele Sizer Treating Rylie Limburg/Extender: Skipper Cliche in Treatment: 4 Encounter Discharge Information Items Discharge Condition: Stable Ambulatory Status: Ambulatory Discharge Destination: Home Transportation: Private Auto Accompanied By: wife Schedule Follow-up Appointment: Yes Clinical Summary of Care: Patient Declined Electronic Signature(s) Signed: 01/24/2021 4:41:26 PM By: Carlene Coria RN Entered By: Carlene Coria on 01/24/2021 16:41:26 Billy Franco ([id number]) -------------------------------------------------------------------------------- Lower Extremity Assessment Details Patient Name: Billy Franco Date of Service: 01/24/2021 2:00 PM Medical Record Number: [id number] Patient Account Number: [id number] Date of Birth/Sex: Mar 22, 1940 (80 y.o. M) Treating RN: Carlene Coria Primary Care Tavius Turgeon: Steele Sizer Other Clinician: Referring Yadir Zentner: Steele Sizer Treating Dawnielle Christiana/Extender: Skipper Cliche in Treatment: 4 Edema Assessment Assessed: [Left: No] [Right: No] Edema: [Left: Ye] [Right: s] Calf Left: Right: Point of Measurement: 36 cm From Medial Instep 32 cm Ankle Left: Right: Point of Measurement: 10 cm From Medial Instep 22 cm Vascular Assessment Pulses: Dorsalis Pedis Palpable: [Right:Yes] Electronic Signature(s) Signed: 01/25/2021 4:33:01 PM By: Carlene Coria RN Entered By: Carlene Coria on 01/24/2021 14:31:36 Billy Franco ([id number]) -------------------------------------------------------------------------------- Multi Wound Chart Details Patient Name: Billy Franco Date of Service: 01/24/2021 2:00 PM Medical Record Number: [id number] Patient Account Number: [id number] Date of Birth/Sex: 07-22-1940 (80 y.o. M) Treating RN: Carlene Coria Primary Care Kweli Grassel: Steele Sizer Other Clinician: Referring Sanjith Siwek: Steele Sizer Treating Mayank Teuscher/Extender:  Skipper Cliche in Treatment: 4 Vital Signs Height(in): 67 Pulse(bpm): 45 Weight(lbs): 150 Blood Pressure(mmHg): 110/62 Body Mass Index(BMI): 23 Temperature(F): 98.1 Respiratory Rate(breaths/min): 16 Photos: [N/A:N/A] Wound Location: Right, Lateral Lower Leg N/A N/A Wounding Event: Bite N/A N/A Primary Etiology: Diabetic Wound/Ulcer of the Lower N/A N/A Extremity Comorbid History: Congestive Heart Failure, N/A N/A Hypertension, Type II Diabetes Date Acquired: 11/16/2020 N/A N/A Weeks of Treatment: 4 N/A N/A Wound Status: Open N/A N/A Measurements L x W x D (cm) 1.7x1x0.3 N/A N/A  Area (cm) : 1.335 N/A N/A Volume (cm) : 0.401 N/A N/A % Reduction in Area: 29.20% N/A N/A % Reduction in Volume: 57.40% N/A N/A Classification: Grade 2 N/A N/A Exudate Amount: Medium N/A N/A Exudate Type: Serosanguineous N/A N/A Exudate Color: red, brown N/A N/A Granulation Amount: Large (67-100%) N/A N/A Granulation Quality: Red N/A N/A Necrotic Amount: Small (1-33%) N/A N/A Exposed Structures: Fat Layer (Subcutaneous Tissue): N/A N/A Yes Fascia: No Tendon: No Muscle: No Joint: No Bone: No Epithelialization: None N/A N/A Treatment Notes Electronic Signature(s) Signed: 01/25/2021 4:33:01 PM By: Carlene Coria RN Entered By: Carlene Coria on 01/24/2021 15:15:13 Billy Franco ([id number]) -------------------------------------------------------------------------------- Multi-Disciplinary Care Plan Details Patient Name: Billy Franco Date of Service: 01/24/2021 2:00 PM Medical Record Number: [id number] Patient Account Number: [id number] Date of Birth/Sex: [birth year] (80 y.o. M) Treating RN: Carlene Coria Primary Care Kavitha Lansdale: Steele Sizer Other Clinician: Referring Quientin Jent: Steele Sizer Treating Jeanette Moffatt/Extender: Skipper Cliche in Treatment: 4 Active Inactive Wound/Skin Impairment Nursing Diagnoses: Knowledge deficit related to ulceration/compromised skin  integrity Goals: Patient/caregiver will verbalize understanding of skin care regimen Date Initiated: 12/24/2020 Target Resolution Date: 01/24/2021 Goal Status: Active Ulcer/skin breakdown will have a volume reduction of 30% by week 4 Date Initiated: 12/24/2020 Target Resolution Date: 02/24/2021 Goal Status: Active Ulcer/skin breakdown will have a volume reduction of 50% by week 8 Date Initiated: 12/24/2020 Target Resolution Date: 03/24/2021 Goal Status: Active Ulcer/skin breakdown will have a volume reduction of 80% by week 12 Date Initiated: 12/24/2020 Target Resolution Date: 04/24/2021 Goal Status: Active Ulcer/skin breakdown will heal within 14 weeks Date Initiated: 12/24/2020 Target Resolution Date: 05/24/2021 Goal Status: Active Interventions: Assess patient/caregiver ability to obtain necessary supplies Assess patient/caregiver ability to perform ulcer/skin care regimen upon admission and as needed Assess ulceration(s) every visit Notes: Electronic Signature(s) Signed: 01/25/2021 4:33:01 PM By: Carlene Coria RN Entered By: Carlene Coria on 01/24/2021 15:14:58 Billy Franco ([id number]) -------------------------------------------------------------------------------- Pain Assessment Details Patient Name: Billy Franco Date of Service: 01/24/2021 2:00 PM Medical Record Number: [id number] Patient Account Number: [id number] Date of Birth/Sex: [birth year] (80 y.o. M) Treating RN: Carlene Coria Primary Care Adaira Centola: Steele Sizer Other Clinician: Referring Shanetha Bradham: Steele Sizer Treating Mikael Debell/Extender: Skipper Cliche in Treatment: 4 Active Problems Location of Pain Severity and Description of Pain Patient Has Paino No Site Locations Pain Management and Medication Current Pain Management: Electronic Signature(s) Signed: 01/25/2021 4:33:01 PM By: Carlene Coria RN Entered By: Carlene Coria on 01/24/2021 14:24:31 Billy Franco  ([id number]) -------------------------------------------------------------------------------- Patient/Caregiver Education Details Patient Name: Billy Franco Date of Service: 01/24/2021 2:00 PM Medical Record Number: [id number] Patient Account Number: [id number] Date of Birth/Gender: February 05, 1940 (80 y.o. M) Treating RN: Carlene Coria Primary Care Physician: Steele Sizer Other Clinician: Referring Physician: Steele Sizer Treating Physician/Extender: Skipper Cliche in Treatment: 4 Education Assessment Education Provided To: Patient Education Topics Provided Wound/Skin Impairment: Methods: Explain/Verbal Responses: State content correctly Electronic Signature(s) Signed: 01/25/2021 4:33:01 PM By: Carlene Coria RN Entered By: Carlene Coria on 01/24/2021 [place], [place] ([id number]) -------------------------------------------------------------------------------- Wound Assessment Details Patient Name: Billy Franco Date of Service: 01/24/2021 2:00 PM Medical Record Number: [id number] Patient Account Number: [id number] Date of Birth/Sex: 1940/04/14 (80 y.o. M) Treating RN: Carlene Coria Primary Care Reannah Totten: Steele Sizer Other Clinician: Referring Siyah Mault: Steele Sizer Treating Kili Gracy/Extender: Skipper Cliche in Treatment: 4 Wound Status Wound Number: 1 Primary Etiology: Diabetic Wound/Ulcer of the Lower Extremity Wound Location: Right, Lateral Lower Leg Wound Status: Open Wounding Event: Bite Comorbid Congestive Heart Failure, Hypertension, Type II History: Diabetes Date Acquired: 11/16/2020 Weeks Of  Treatment: 4 Clustered Wound: No Photos Wound Measurements Length: (cm) 1.7 Width: (cm) 1 Depth: (cm) 0.3 Area: (cm) 1.335 Volume: (cm) 0.401 % Reduction in Area: 29.2% % Reduction in Volume: 57.4% Epithelialization: None Tunneling: No Undermining: No Wound Description Classification: Grade 2 Exudate Amount: Medium Exudate Type:  Serosanguineous Exudate Color: red, brown Foul Odor After Cleansing: No Slough/Fibrino Yes Wound Bed Granulation Amount: Large (67-100%) Exposed Structure Granulation Quality: Red Fascia Exposed: No Necrotic Amount: Small (1-33%) Fat Layer (Subcutaneous Tissue) Exposed: Yes Necrotic Quality: Adherent Slough Tendon Exposed: No Muscle Exposed: No Joint Exposed: No Bone Exposed: No Treatment Notes Wound #1 (Lower Leg) Wound Laterality: Right, Lateral Cleanser [place]ram Ancillary Kit - 15 Day Supply Discharge Instruction: Use supplies as instructed; Kit contains: (15) Saline Bullets; (15) 3x3 Gauze; 15 pr Gloves Soap and Water Discharge Instruction: Gently cleanse wound with antibacterial soap, rinse and pat dry prior to dressing wounds Billy, Franco ([id number]) Peri-Wound Care Topical Primary Dressing IODOFLEX 0.9% Cadexomer Iodine Pad Discharge Instruction: Apply Iodoflex to wound bed only as directed. Secondary Dressing ABD Pad 5x9 (in/in) Discharge Instruction: Cover with ABD pad Secured With 77M [employer] Surgical Tape, 2x2 (in/yd) Compression Wrap tubi grip Discharge Instruction: size c Compression Stockings Add-Ons Electronic Signature(s) Signed: 01/25/2021 4:33:01 PM By: Carlene Coria RN Entered By: Carlene Coria on 01/24/2021 14:30:35 Billy Franco ([id number]) -------------------------------------------------------------------------------- Vitals Details Patient Name: Billy Franco Date of Service: 01/24/2021 2:00 PM Medical Record Number: [id number] Patient Account Number: [id number] Date of Birth/Sex: 05-21-1940 (80 y.o. M) Treating RN: Carlene Coria Primary Care Keaghan Bowens: Steele Sizer Other Clinician: Referring Sole Lengacher: Steele Sizer Treating Mandeep Ferch/Extender: Skipper Cliche in Treatment: 4 Vital Signs Time Taken: 14:24 Temperature (F): 98.1 Height (in): 67 Pulse (bpm): 77 Weight (lbs): 150 Respiratory Rate (breaths/min): 16 Body  Mass Index (BMI): 23.5 Blood Pressure (mmHg): 110/62 Reference Range: 80 - 120 mg / dl Electronic Signature(s) Signed: 01/25/2021 4:33:01 PM By: Carlene Coria RN Entered By: Carlene Coria on 01/24/2021 14:24:22

## 2021-01-29 ENCOUNTER — Encounter: Payer: Medicare Other | Admitting: Physician Assistant

## 2021-01-29 ENCOUNTER — Other Ambulatory Visit: Payer: Self-pay

## 2021-01-29 DIAGNOSIS — E11622 Type 2 diabetes mellitus with other skin ulcer: Secondary | ICD-10-CM | POA: Diagnosis not present

## 2021-01-29 DIAGNOSIS — I480 Paroxysmal atrial fibrillation: Secondary | ICD-10-CM | POA: Diagnosis not present

## 2021-01-29 DIAGNOSIS — L97812 Non-pressure chronic ulcer of other part of right lower leg with fat layer exposed: Secondary | ICD-10-CM | POA: Diagnosis not present

## 2021-01-29 DIAGNOSIS — I110 Hypertensive heart disease with heart failure: Secondary | ICD-10-CM | POA: Diagnosis not present

## 2021-01-29 DIAGNOSIS — I5042 Chronic combined systolic (congestive) and diastolic (congestive) heart failure: Secondary | ICD-10-CM | POA: Diagnosis not present

## 2021-01-29 DIAGNOSIS — S81851A Open bite, right lower leg, initial encounter: Secondary | ICD-10-CM | POA: Diagnosis not present

## 2021-01-29 DIAGNOSIS — I10 Essential (primary) hypertension: Secondary | ICD-10-CM | POA: Diagnosis not present

## 2021-01-29 NOTE — Progress Notes (Addendum)
TORRES, HARDENBROOK ([id number]) Visit Report for 01/29/2021 Arrival Information Details Patient Name: Billy Franco, Billy Franco Date of Service: 01/29/2021 1:15 PM Medical Record Number: [id number] Patient Account Number: [id number] Date of Birth/Sex: [birth year] (81 y.o. M) Treating RN: Carlene Coria Primary Care Millenia Waldvogel: Steele Sizer Other Clinician: Referring Kelan Pritt: Steele Sizer Treating Ronen Bromwell/Extender: Skipper Cliche in Treatment: 5 Visit Information History Since Last Visit All ordered tests and consults were completed: No Patient Arrived: Ambulatory Added or deleted any medications: No Arrival Time: 13:14 Any new allergies or adverse reactions: No Accompanied By: self Had a fall or experienced change in No Transfer Assistance: None activities of daily living that may affect Patient Identification Verified: Yes risk of falls: Secondary Verification Process Completed: Yes Signs or symptoms of abuse/neglect since last visito No Patient Requires Transmission-Based No Hospitalized since last visit: No Precautions: Implantable device outside of the clinic excluding No Patient Has Alerts: Yes cellular tissue based products placed in the center Patient Alerts: Patient on Blood since last visit: Thinner Has Dressing in Place as Prescribed: Yes Pain Present Now: No Electronic Signature(s) Signed: 01/30/2021 6:47:24 PM By: Carlene Coria RN Entered By: Carlene Coria on 01/29/2021 13:19:55 Billy Franco ([id number]) -------------------------------------------------------------------------------- Clinic Level of Care Assessment Details Patient Name: Billy Franco Date of Service: 01/29/2021 1:15 PM Medical Record Number: [id number] Patient Account Number: [id number] Date of Birth/Sex: 08/18/1940 (80 y.o. M) Treating RN: Carlene Coria Primary Care Audwin Semper: Steele Sizer Other Clinician: Referring Madyx Delfin: Steele Sizer Treating Laddie Naeem/Extender: Skipper Cliche in  Treatment: 5 Clinic Level of Care Assessment Items TOOL 4 Quantity Score X - Use when only an EandM is performed on FOLLOW-UP visit 1 0 ASSESSMENTS - Nursing Assessment / Reassessment X - Reassessment of Co-morbidities (includes updates in patient status) 1 10 X- 1 5 Reassessment of Adherence to Treatment Plan ASSESSMENTS - Wound and Skin Assessment / Reassessment X - Simple Wound Assessment / Reassessment - one wound 1 5 []  - 0 Complex Wound Assessment / Reassessment - multiple wounds []  - 0 Dermatologic / Skin Assessment (not related to wound area) ASSESSMENTS - Focused Assessment []  - Circumferential Edema Measurements - multi extremities 0 []  - 0 Nutritional Assessment / Counseling / Intervention []  - 0 Lower Extremity Assessment (monofilament, tuning fork, pulses) []  - 0 Peripheral Arterial Disease Assessment (using hand held doppler) ASSESSMENTS - Ostomy and/or Continence Assessment and Care []  - Incontinence Assessment and Management 0 []  - 0 Ostomy Care Assessment and Management (repouching, etc.) PROCESS - Coordination of Care X - Simple Patient / Family Education for ongoing care 1 15 []  - 0 Complex (extensive) Patient / Family Education for ongoing care []  - 0 Staff obtains Programmer, systems, Records, Test Results / Process Orders []  - 0 Staff telephones HHA, Nursing Homes / Clarify orders / etc []  - 0 Routine Transfer to another Facility (non-emergent condition) []  - 0 Routine Hospital Admission (non-emergent condition) []  - 0 New Admissions / Biomedical engineer / Ordering NPWT, Apligraf, etc. []  - 0 Emergency Hospital Admission (emergent condition) X- 1 10 Simple Discharge Coordination []  - 0 Complex (extensive) Discharge Coordination PROCESS - Special Needs []  - Pediatric / Minor Patient Management 0 []  - 0 Isolation Patient Management []  - 0 Hearing / Language / Visual special needs []  - 0 Assessment of Community assistance (transportation, D/C  planning, etc.) []  - 0 Additional assistance / Altered mentation []  - 0 Support Surface(s) Assessment (bed, cushion, seat, etc.) INTERVENTIONS - Wound Cleansing / Measurement Cuervo, Michaeljohn ([id number]) X- 1 5 Simple Wound Cleansing -  one wound []  - 0 Complex Wound Cleansing - multiple wounds X- 1 5 Wound Imaging (photographs - any number of wounds) []  - 0 Wound Tracing (instead of photographs) X- 1 5 Simple Wound Measurement - one wound []  - 0 Complex Wound Measurement - multiple wounds INTERVENTIONS - Wound Dressings X - Small Wound Dressing one or multiple wounds 1 10 []  - 0 Medium Wound Dressing one or multiple wounds []  - 0 Large Wound Dressing one or multiple wounds []  - 0 Application of Medications - topical []  - 0 Application of Medications - injection INTERVENTIONS - Miscellaneous []  - External ear exam 0 []  - 0 Specimen Collection (cultures, biopsies, blood, body fluids, etc.) []  - 0 Specimen(s) / Culture(s) sent or taken to Lab for analysis []  - 0 Patient Transfer (multiple staff / Civil Service fast streamer / Similar devices) []  - 0 Simple Staple / Suture removal (25 or less) []  - 0 Complex Staple / Suture removal (26 or more) []  - 0 Hypo / Hyperglycemic Management (close monitor of Blood Glucose) []  - 0 Ankle / Brachial Index (ABI) - do not check if billed separately X- 1 5 Vital Signs Has the patient been seen at the hospital within the last three years: Yes Total Score: 75 Level Of Care: New/Established - Level 2 Electronic Signature(s) Signed: 01/30/2021 6:47:24 PM By: Carlene Coria RN Entered By: Carlene Coria on 01/29/2021 13:56:57 Billy Franco ([id number]) -------------------------------------------------------------------------------- Encounter Discharge Information Details Patient Name: Billy Franco Date of Service: 01/29/2021 1:15 PM Medical Record Number: [id number] Patient Account Number: [id number] Date of Birth/Sex: 1940/05/21 (80 y.o. M) Treating  RN: Carlene Coria Primary Care Kenslei Hearty: Steele Sizer Other Clinician: Referring Blondell Laperle: Steele Sizer Treating Ivin Rosenbloom/Extender: Skipper Cliche in Treatment: 5 Encounter Discharge Information Items Discharge Condition: Stable Ambulatory Status: Ambulatory Discharge Destination: Home Transportation: Private Auto Accompanied By: self Schedule Follow-up Appointment: Yes Clinical Summary of Care: Patient Declined Electronic Signature(s) Signed: 01/30/2021 6:47:24 PM By: Carlene Coria RN Entered By: Carlene Coria on 01/29/2021 14:03:44 Billy Franco ([id number]) -------------------------------------------------------------------------------- Lower Extremity Assessment Details Patient Name: Billy Franco Date of Service: 01/29/2021 1:15 PM Medical Record Number: [id number] Patient Account Number: [id number] Date of Birth/Sex: 09/01/1940 (80 y.o. M) Treating RN: Carlene Coria Primary Care Cj Edgell: Steele Sizer Other Clinician: Referring Deuntae Kocsis: Steele Sizer Treating Gerson Fauth/Extender: Skipper Cliche in Treatment: 5 Edema Assessment Assessed: [Left: No] [Right: No] Edema: [Left: Ye] [Right: s] Calf Left: Right: Point of Measurement: 36 cm From Medial Instep 31 cm Ankle Left: Right: Point of Measurement: 10 cm From Medial Instep 22 cm Vascular Assessment Pulses: Dorsalis Pedis Palpable: [Right:Yes] Electronic Signature(s) Signed: 01/30/2021 6:47:24 PM By: Carlene Coria RN Entered By: Carlene Coria on 01/29/2021 [place], Demitri ([id number]) -------------------------------------------------------------------------------- Multi Wound Chart Details Patient Name: Billy Franco Date of Service: 01/29/2021 1:15 PM Medical Record Number: [id number] Patient Account Number: [id number] Date of Birth/Sex: 17-Nov-1940 (80 y.o. M) Treating RN: Carlene Coria Primary Care Bassam Dresch: Steele Sizer Other Clinician: Referring Neyra Pettie: Steele Sizer Treating  Barbra Miner/Extender: Skipper Cliche in Treatment: 5 Vital Signs Height(in): 67 Pulse(bpm): 34 Weight(lbs): 150 Blood Pressure(mmHg): 127/63 Body Mass Index(BMI): 23 Temperature(F): 97.9 Respiratory Rate(breaths/min): 18 Photos: [N/A:N/A] Wound Location: Right, Lateral Lower Leg N/A N/A Wounding Event: Bite N/A N/A Primary Etiology: Diabetic Wound/Ulcer of the Lower N/A N/A Extremity Comorbid History: Congestive Heart Failure, N/A N/A Hypertension, Type II Diabetes Date Acquired: 11/16/2020 N/A N/A Weeks of Treatment: 5 N/A N/A Wound Status: Open N/A N/A Measurements L x W x D (cm) 2x1x0.3 N/A N/A  Area (cm) : 1.571 N/A N/A Volume (cm) : 0.471 N/A N/A % Reduction in Area: 16.70% N/A N/A % Reduction in Volume: 50.00% N/A N/A Classification: Grade 2 N/A N/A Exudate Amount: Medium N/A N/A Exudate Type: Serosanguineous N/A N/A Exudate Color: red, brown N/A N/A Granulation Amount: Large (67-100%) N/A N/A Granulation Quality: Red N/A N/A Necrotic Amount: Small (1-33%) N/A N/A Exposed Structures: Fat Layer (Subcutaneous Tissue): N/A N/A Yes Fascia: No Tendon: No Muscle: No Joint: No Bone: No Epithelialization: None N/A N/A Treatment Notes Electronic Signature(s) Signed: 01/30/2021 6:47:24 PM By: Carlene Coria RN Entered By: Carlene Coria on 01/29/2021 13:54:31 Billy Franco ([id number]) -------------------------------------------------------------------------------- Multi-Disciplinary Care Plan Details Patient Name: Billy Franco Date of Service: 01/29/2021 1:15 PM Medical Record Number: [id number] Patient Account Number: [id number] Date of Birth/Sex: 1941-01-12 (80 y.o. M) Treating RN: Carlene Coria Primary Care Dayton Kenley: Steele Sizer Other Clinician: Referring Mehar Sagen: Steele Sizer Treating Emma Birchler/Extender: Skipper Cliche in Treatment: 5 Active Inactive Wound/Skin Impairment Nursing Diagnoses: Knowledge deficit related to ulceration/compromised skin  integrity Goals: Patient/caregiver will verbalize understanding of skin care regimen Date Initiated: 12/24/2020 Target Resolution Date: 01/24/2021 Goal Status: Active Ulcer/skin breakdown will have a volume reduction of 30% by week 4 Date Initiated: 12/24/2020 Target Resolution Date: 02/24/2021 Goal Status: Active Ulcer/skin breakdown will have a volume reduction of 50% by week 8 Date Initiated: 12/24/2020 Target Resolution Date: 03/24/2021 Goal Status: Active Ulcer/skin breakdown will have a volume reduction of 80% by week 12 Date Initiated: 12/24/2020 Target Resolution Date: 04/24/2021 Goal Status: Active Ulcer/skin breakdown will heal within 14 weeks Date Initiated: 12/24/2020 Target Resolution Date: 05/24/2021 Goal Status: Active Interventions: Assess patient/caregiver ability to obtain necessary supplies Assess patient/caregiver ability to perform ulcer/skin care regimen upon admission and as needed Assess ulceration(s) every visit Notes: Electronic Signature(s) Signed: 01/30/2021 6:47:24 PM By: Carlene Coria RN Entered By: Carlene Coria on 01/29/2021 13:54:20 Billy Franco ([id number]) -------------------------------------------------------------------------------- Pain Assessment Details Patient Name: Billy Franco Date of Service: 01/29/2021 1:15 PM Medical Record Number: [id number] Patient Account Number: [id number] Date of Birth/Sex: 05/25/1940 (80 y.o. M) Treating RN: Carlene Coria Primary Care Maikayla Beggs: Steele Sizer Other Clinician: Referring Terica Yogi: Steele Sizer Treating Nathen Balaban/Extender: Skipper Cliche in Treatment: 5 Active Problems Location of Pain Severity and Description of Pain Patient Has Paino No Site Locations Pain Management and Medication Current Pain Management: Electronic Signature(s) Signed: 01/30/2021 6:47:24 PM By: Carlene Coria RN Entered By: Carlene Coria on 01/29/2021 13:20:34 Billy Franco  ([id number]) -------------------------------------------------------------------------------- Patient/Caregiver Education Details Patient Name: Billy Franco Date of Service: 01/29/2021 1:15 PM Medical Record Number: [id number] Patient Account Number: [id number] Date of Birth/Gender: 07/09/1940 (80 y.o. M) Treating RN: Carlene Coria Primary Care Physician: Steele Sizer Other Clinician: Referring Physician: Steele Sizer Treating Physician/Extender: Skipper Cliche in Treatment: 5 Education Assessment Education Provided To: Patient Education Topics Provided Wound/Skin Impairment: Methods: Explain/Verbal Responses: State content correctly Electronic Signature(s) Signed: 01/30/2021 6:47:24 PM By: Carlene Coria RN Entered By: Carlene Coria on 01/29/2021 14:02:54 Billy Franco ([id number]) -------------------------------------------------------------------------------- Wound Assessment Details Patient Name: Billy Franco Date of Service: 01/29/2021 1:15 PM Medical Record Number: [id number] Patient Account Number: [id number] Date of Birth/Sex: 19-Mar-1940 (80 y.o. M) Treating RN: Carlene Coria Primary Care Noelene Gang: Steele Sizer Other Clinician: Referring Atwell Mcdanel: Steele Sizer Treating Elhadji Pecore/Extender: Skipper Cliche in Treatment: 5 Wound Status Wound Number: 1 Primary Etiology: Diabetic Wound/Ulcer of the Lower Extremity Wound Location: Right, Lateral Lower Leg Wound Status: Open Wounding Event: Bite Comorbid Congestive Heart Failure, Hypertension, Type II History: Diabetes Date Acquired: 11/16/2020 Weeks Of  Treatment: 5 Clustered Wound: No Photos Wound Measurements Length: (cm) 2 Width: (cm) 1 Depth: (cm) 0.3 Area: (cm) 1.571 Volume: (cm) 0.471 % Reduction in Area: 16.7% % Reduction in Volume: 50% Epithelialization: None Tunneling: No Undermining: No Wound Description Classification: Grade 2 Exudate Amount: Medium Exudate Type:  Serosanguineous Exudate Color: red, brown Foul Odor After Cleansing: No Slough/Fibrino Yes Wound Bed Granulation Amount: Large (67-100%) Exposed Structure Granulation Quality: Red Fascia Exposed: No Necrotic Amount: Small (1-33%) Fat Layer (Subcutaneous Tissue) Exposed: Yes Necrotic Quality: Adherent Slough Tendon Exposed: No Muscle Exposed: No Joint Exposed: No Bone Exposed: No Treatment Notes Wound #1 (Lower Leg) Wound Laterality: Right, Lateral Cleanser [place]ram Ancillary Kit - 15 Day Supply Discharge Instruction: Use supplies as instructed; Kit contains: (15) Saline Bullets; (15) 3x3 Gauze; 15 pr Gloves Soap and Water Discharge Instruction: Gently cleanse wound with antibacterial soap, rinse and pat dry prior to dressing wounds Stiehl, Cheick ([id number]) Peri-Wound Care Topical Primary Dressing Prisma 4.34 (in) Discharge Instruction: Moisten w/normal saline or sterile water; Cover wound as directed. Do not remove from wound bed. Secondary Dressing ABD Pad 5x9 (in/in) Discharge Instruction: Cover with ABD pad Secured With 54M Miner Surgical Tape, 2x2 (in/yd) Compression Wrap tubi grip c Discharge Instruction: size c Compression Stockings Add-Ons Electronic Signature(s) Signed: 01/30/2021 6:47:24 PM By: Carlene Coria RN Entered By: Carlene Coria on 01/29/2021 13:25:17 Billy Franco ([id number]) -------------------------------------------------------------------------------- Vitals Details Patient Name: Billy Franco Date of Service: 01/29/2021 1:15 PM Medical Record Number: [id number] Patient Account Number: [id number] Date of Birth/Sex: 09-04-1940 (80 y.o. M) Treating RN: Carlene Coria Primary Care Zyon Rosser: Steele Sizer Other Clinician: Referring Dushawn Pusey: Steele Sizer Treating Ikran Patman/Extender: Skipper Cliche in Treatment: 5 Vital Signs Time Taken: 13:20 Temperature (F): 97.9 Height (in): 67 Pulse (bpm): 76 Weight (lbs):  150 Respiratory Rate (breaths/min): 18 Body Mass Index (BMI): 23.5 Blood Pressure (mmHg): 127/63 Reference Range: 80 - 120 mg / dl Electronic Signature(s) Signed: 01/30/2021 6:47:24 PM By: Carlene Coria RN Entered By: Carlene Coria on 01/29/2021 13:20:22

## 2021-01-29 NOTE — Progress Notes (Addendum)
Billy Franco, Billy Franco ([id number]) Visit Report for 01/29/2021 Chief Complaint Document Details Patient Name: Billy Franco, Billy Franco Date of Service: 01/29/2021 1:15 PM Medical Record Number: [id number] Patient Account Number: [id number] Date of Birth/Sex: 1941/01/09 (81 y.o. M) Treating RN: Carlene Coria Primary Care Provider: Steele Sizer Other Clinician: Referring Provider: Steele Sizer Treating Provider/Extender: Skipper Cliche in Treatment: 5 Information Obtained from: Patient Chief Complaint Right Leg Dog Bite Electronic Signature(s) Signed: 01/29/2021 1:18:47 PM By: Worthy Keeler PA-C Entered By: Worthy Keeler on 01/29/2021 13:18:46 Billy Franco ([id number]) -------------------------------------------------------------------------------- HPI Details Patient Name: Billy Franco Date of Service: 01/29/2021 1:15 PM Medical Record Number: [id number] Patient Account Number: [id number] Date of Birth/Sex: 02/08/1940 (81 y.o. M) Treating RN: Carlene Coria Primary Care Provider: Steele Sizer Other Clinician: Referring Provider: Steele Sizer Treating Provider/Extender: Skipper Cliche in Treatment: 5 History of Present Illness HPI Description: 12/24/2020 upon evaluation today patient appears to be doing somewhat poorly in regard to the wound that actually occurred around November 13 2020. This was actually a dog bite he is unsure of what type of dog it was it was a small wound that wound was not a familiar dog to him. Subsequently he ended up being seen by his primary care provider in October 28 by his primary care provider. Subsequently they originally placed him on 2 rounds of amoxicillin he is also been on doxycycline. Unfortunately this just does not seem to be getting better despite the treatment up to this point they have been initially cleaning with alcohol although that has been discontinued when he went to the ER on November 14. They actually recommended cleaning with just  soap and water. There is really not been any specific dressings that were recommended at this time. Subsequently the patient does have an ABI of 1.3 here in the clinic today. This is on the right. Subsequently also has a recent hemoglobin A1c from when he was at the ER of 6.3. He really is not having any significant pain which is good news he does have some swelling which is going to need to be addressed as well. This is minimal however I do not think he has to have a compression wrap I think Tubigrip would probably be appropriate. Patient does have a history of diabetes mellitus type 2, congestive heart failure, atrial fibrillation, and hypertension. 01/01/2021 upon evaluation today patient appears to be doing pretty well in regard to his wound. Fortunately I do not see any signs of infection and in 1 week's time I feel like we have made some good progress here. No fevers, chills, nausea, vomiting, or diarrhea. 01/08/2021 upon evaluation today patient's wound at this point is showing signs of doing really about the same. I am not seeing as much improvement as I would like to see. I think at this point that I would recommend we see about actually using a different medication I am thinking Iodosorb could be beneficial for him. He is in agreement with given something else to try here. 12/27; use Iodoflex starting last week. Wound surface looks better but no major change in dimensions 01/24/2021 upon evaluation today patient appears to be doing well with regard to his leg ulcer. I am actually seeing signs of significant improvement and very pleased in this regard. I do not see any evidence of active infection which is great news. No fevers, chills, nausea, vomiting, or diarrhea. 01/29/2021 upon evaluation today patient appears to be doing well with regard to his leg ulcer. This is not significantly  smaller but is also not any larger I do think at this point switching over to the silver collagen to see if  we get this to speed up would be a good option. We have been using the Iodoflex which I think has been helpful but nonetheless I think we might be able to do something to speed this up a bit at this point. Electronic Signature(s) Signed: 01/29/2021 2:03:17 PM By: Worthy Keeler PA-C Entered By: Worthy Keeler on 01/29/2021 14:03:17 Billy Franco ([id number]) -------------------------------------------------------------------------------- Physical Exam Details Patient Name: Billy Franco Date of Service: 01/29/2021 1:15 PM Medical Record Number: [id number] Patient Account Number: [id number] Date of Birth/Sex: 23-May-1940 (81 y.o. M) Treating RN: Carlene Coria Primary Care Provider: Steele Sizer Other Clinician: Referring Provider: Steele Sizer Treating Provider/Extender: Skipper Cliche in Treatment: 5 Constitutional Well-nourished and well-hydrated in no acute distress. Respiratory normal breathing without difficulty. Psychiatric this patient is able to make decisions and demonstrates good insight into disease process. Alert and Oriented x 3. pleasant and cooperative. Notes Upon inspection patient's wound bed showed signs of good granulation and epithelization at this point. Fortunately I do not see any signs of active infection locally nor systemically at this time. No fevers, chills, nausea, vomiting, or diarrhea. Electronic Signature(s) Signed: 01/29/2021 2:03:37 PM By: Worthy Keeler PA-C Entered By: Worthy Keeler on 01/29/2021 14:03:37 Billy Franco ([id number]) -------------------------------------------------------------------------------- Physician Orders Details Patient Name: Billy Franco Date of Service: 01/29/2021 1:15 PM Medical Record Number: [id number] Patient Account Number: [id number] Date of Birth/Sex: 04/21/1940 (81 y.o. M) Treating RN: Carlene Coria Primary Care Provider: Steele Sizer Other Clinician: Referring Provider: Steele Sizer Treating  Provider/Extender: Skipper Cliche in Treatment: 5 Verbal / Phone Orders: No Diagnosis Coding ICD-10 Coding Code Description W54.0XXA Bitten by dog, initial encounter [phone] Non-pressure chronic ulcer of other part of right lower leg with fat layer exposed E11.622 Type 2 diabetes mellitus with other skin ulcer I50.42 Chronic combined systolic (congestive) and diastolic (congestive) heart failure I48.0 Paroxysmal atrial fibrillation I10 Essential (primary) hypertension Follow-up Appointments o Return Appointment in 1 week. Bathing/ Shower/ Hygiene o May shower; gently cleanse wound with antibacterial soap, rinse and pat dry prior to dressing wounds - may shower on days dressing changed, remove dressing, shower, reapply dressing Edema Control - Lymphedema / Segmental Compressive Device / Other o Tubigrip single layer applied. - side c o Elevate, Exercise Daily and Avoid Standing for Long Periods of Time. o Elevate legs to the level of the heart and pump ankles as often as possible o Elevate leg(s) parallel to the floor when sitting. Wound Treatment Wound #1 - Lower Leg Wound Laterality: Right, Lateral Cleanser: Byram Ancillary Kit - 15 Day Supply (Generic) 3 x Per Week/30 Days Discharge Instructions: Use supplies as instructed; Kit contains: (15) Saline Bullets; (15) 3x3 Gauze; 15 pr Gloves Cleanser: Soap and Water 3 x Per Week/30 Days Discharge Instructions: Gently cleanse wound with antibacterial soap, rinse and pat dry prior to dressing wounds Primary Dressing: Prisma 4.34 (in) 3 x Per Week/30 Days Discharge Instructions: Moisten w/normal saline or sterile water; Cover wound as directed. Do not remove from wound bed. Secondary Dressing: ABD Pad 5x9 (in/in) (Generic) 3 x Per Week/30 Days Discharge Instructions: Cover with ABD pad Secured With: 65M Medipore H Soft Cloth Surgical Tape, 2x2 (in/yd) (Generic) 3 x Per Week/30 Days Compression Wrap: tubi grip 3 x Per Week/30  Days Discharge Instructions: size c Electronic Signature(s) Signed: 01/29/2021 4:15:58 PM By: Worthy Keeler PA-C Signed:  01/30/2021 6:47:24 PM By: Carlene Coria RN Entered By: Carlene Coria on 01/29/2021 Billy Franco, Billy Franco ([id number]) -------------------------------------------------------------------------------- Problem List Details Patient Name: Billy Franco Date of Service: 01/29/2021 1:15 PM Medical Record Number: [id number] Patient Account Number: [id number] Date of Birth/Sex: [birth year] (81 y.o. M) Treating RN: Carlene Coria Primary Care Provider: Steele Sizer Other Clinician: Referring Provider: Steele Sizer Treating Provider/Extender: Skipper Cliche in Treatment: 5 Active Problems ICD-10 Encounter Code Description Active Date MDM Diagnosis W54.0XXA Bitten by dog, initial encounter 12/24/2020 No Yes L97.812 Non-pressure chronic ulcer of other part of right lower leg with fat layer 12/24/2020 No Yes exposed E11.622 Type 2 diabetes mellitus with other skin ulcer 12/24/2020 No Yes I50.42 Chronic combined systolic (congestive) and diastolic (congestive) heart 12/24/2020 No Yes failure I48.0 Paroxysmal atrial fibrillation 12/24/2020 No Yes I10 Essential (primary) hypertension 12/24/2020 No Yes Inactive Problems Resolved Problems Electronic Signature(s) Signed: 01/29/2021 1:18:33 PM By: Worthy Keeler PA-C Entered By: Worthy Keeler on 01/29/2021 13:18:33 Billy Franco ([id number]) -------------------------------------------------------------------------------- Progress Note Details Patient Name: Billy Franco Date of Service: 01/29/2021 1:15 PM Medical Record Number: [id number] Patient Account Number: [id number] Date of Birth/Sex: 19-Aug-1940 (81 y.o. M) Treating RN: Carlene Coria Primary Care Provider: Steele Sizer Other Clinician: Referring Provider: Steele Sizer Treating Provider/Extender: Skipper Cliche in Treatment: 5 Subjective Chief  Complaint Information obtained from Patient Right Leg Dog Bite History of Present Illness (HPI) 12/24/2020 upon evaluation today patient appears to be doing somewhat poorly in regard to the wound that actually occurred around November 13 2020. This was actually a dog bite he is unsure of what type of dog it was it was a small wound that wound was not a familiar dog to him. Subsequently he ended up being seen by his primary care provider in October 28 by his primary care provider. Subsequently they originally placed him on 2 rounds of amoxicillin he is also been on doxycycline. Unfortunately this just does not seem to be getting better despite the treatment up to this point they have been initially cleaning with alcohol although that has been discontinued when he went to the ER on November 14. They actually recommended cleaning with just soap and water. There is really not been any specific dressings that were recommended at this time. Subsequently the patient does have an ABI of 1.3 here in the clinic today. This is on the right. Subsequently also has a recent hemoglobin A1c from when he was at the ER of 6.3. He really is not having any significant pain which is good news he does have some swelling which is going to need to be addressed as well. This is minimal however I do not think he has to have a compression wrap I think Tubigrip would probably be appropriate. Patient does have a history of diabetes mellitus type 2, congestive heart failure, atrial fibrillation, and hypertension. 01/01/2021 upon evaluation today patient appears to be doing pretty well in regard to his wound. Fortunately I do not see any signs of infection and in 1 week's time I feel like we have made some good progress here. No fevers, chills, nausea, vomiting, or diarrhea. 01/08/2021 upon evaluation today patient's wound at this point is showing signs of doing really about the same. I am not seeing as much improvement as I would  like to see. I think at this point that I would recommend we see about actually using a different medication I am thinking Iodosorb could be beneficial for him. He is in agreement with  given something else to try here. 12/27; use Iodoflex starting last week. Wound surface looks better but no major change in dimensions 01/24/2021 upon evaluation today patient appears to be doing well with regard to his leg ulcer. I am actually seeing signs of significant improvement and very pleased in this regard. I do not see any evidence of active infection which is great news. No fevers, chills, nausea, vomiting, or diarrhea. 01/29/2021 upon evaluation today patient appears to be doing well with regard to his leg ulcer. This is not significantly smaller but is also not any larger I do think at this point switching over to the silver collagen to see if we get this to speed up would be a good option. We have been using the Iodoflex which I think has been helpful but nonetheless I think we might be able to do something to speed this up a bit at this point. Objective Constitutional Well-nourished and well-hydrated in no acute distress. Vitals Time Taken: 1:20 PM, Height: 67 in, Weight: 150 lbs, BMI: 23.5, Temperature: 97.9 F, Pulse: 76 bpm, Respiratory Rate: 18 breaths/min, Blood Pressure: 127/63 mmHg. Respiratory normal breathing without difficulty. Psychiatric this patient is able to make decisions and demonstrates good insight into disease process. Alert and Oriented x 3. pleasant and cooperative. General Notes: Upon inspection patient's wound bed showed signs of good granulation and epithelization at this point. Fortunately I do not see any signs of active infection locally nor systemically at this time. No fevers, chills, nausea, vomiting, or diarrhea. Integumentary (Hair, Skin) Wound #1 status is Open. Original cause of wound was Bite. The date acquired was: 11/16/2020. The wound has been in treatment 5  weeks. The [place], Billy Franco ([id number]) wound is located on the Right,Lateral Lower Leg. The wound measures 2cm length x 1cm width x 0.3cm depth; 1.571cm^2 area and 0.471cm^3 volume. There is Fat Layer (Subcutaneous Tissue) exposed. There is no tunneling or undermining noted. There is a medium amount of serosanguineous drainage noted. There is large (67-100%) red granulation within the wound bed. There is a small (1-33%) amount of necrotic tissue within the wound bed including Adherent Slough. Assessment Active Problems ICD-10 Bitten by dog, initial encounter Non-pressure chronic ulcer of other part of right lower leg with fat layer exposed Type 2 diabetes mellitus with other skin ulcer Chronic combined systolic (congestive) and diastolic (congestive) heart failure Paroxysmal atrial fibrillation Essential (primary) hypertension Plan Follow-up Appointments: Return Appointment in 1 week. Bathing/ Shower/ Hygiene: May shower; gently cleanse wound with antibacterial soap, rinse and pat dry prior to dressing wounds - may shower on days dressing changed, remove dressing, shower, reapply dressing Edema Control - Lymphedema / Segmental Compressive Device / Other: Tubigrip single layer applied. - side c Elevate, Exercise Daily and Avoid Standing for Long Periods of Time. Elevate legs to the level of the heart and pump ankles as often as possible Elevate leg(s) parallel to the floor when sitting. WOUND #1: - Lower Leg Wound Laterality: Right, Lateral Cleanser: Byram Ancillary Kit - 15 Day Supply (Generic) 3 x Per Week/30 Days Discharge Instructions: Use supplies as instructed; Kit contains: (15) Saline Bullets; (15) 3x3 Gauze; 15 pr Gloves Cleanser: Soap and Water 3 x Per Week/30 Days Discharge Instructions: Gently cleanse wound with antibacterial soap, rinse and pat dry prior to dressing wounds Primary Dressing: Prisma 4.34 (in) 3 x Per Week/30 Days Discharge Instructions: Moisten w/normal  saline or sterile water; Cover wound as directed. Do not remove from wound bed. Secondary Dressing: ABD Pad  5x9 (in/in) (Generic) 3 x Per Week/30 Days Discharge Instructions: Cover with ABD pad Secured With: 59M Medipore H Soft Cloth Surgical Tape, 2x2 (in/yd) (Generic) 3 x Per Week/30 Days Compression Wrap: tubi grip 3 x Per Week/30 Days Discharge Instructions: size c 1. I would recommend that we go ahead and continue with the Tubigrip which I think has been doing well for him. This is good news. 2. I am also can recommend an ABD pad to cover after applying silver collagen which can be the changes made today I am hopeful this will help things to heal much more effectively and quickly. 3. I am also going to suggest patient continue to elevate his legs much as possible to help with edema control. We will see patient back for reevaluation in 1 week here in the clinic. If anything worsens or changes patient will contact our office for additional recommendations. Electronic Signature(s) Signed: 01/29/2021 2:04:17 PM By: Worthy Keeler PA-C Entered By: Worthy Keeler on 01/29/2021 14:04:16 Billy Franco ([id number]) -------------------------------------------------------------------------------- SuperBill Details Patient Name: Billy Franco Date of Service: 01/29/2021 Medical Record Number: [id number] Patient Account Number: [id number] Date of Birth/Sex: 02/05/1940 (81 y.o. M) Treating RN: Carlene Coria Primary Care Provider: Steele Sizer Other Clinician: Referring Provider: Steele Sizer Treating Provider/Extender: Skipper Cliche in Treatment: 5 Diagnosis Coding ICD-10 Codes Code Description [phone] Bitten by dog, initial encounter [id number] Non-pressure chronic ulcer of other part of right lower leg with fat layer exposed E11.622 Type 2 diabetes mellitus with other skin ulcer I50.42 Chronic combined systolic (congestive) and diastolic (congestive) heart failure I48.0 Paroxysmal  atrial fibrillation I10 Essential (primary) hypertension Facility Procedures CPT4 Code: [id number] Description: [phone] - WOUND CARE VISIT-LEV 2 EST PT Modifier: Quantity: 1 Physician Procedures CPT4 Code: [id number] Description: 76734 - WC PHYS LEVEL 3 - EST PT Modifier: Quantity: 1 CPT4 Code: Description: ICD-10 Diagnosis Description W54.0XXA Bitten by dog, initial encounter [phone] Non-pressure chronic ulcer of other part of right lower leg with fat la E11.622 Type 2 diabetes mellitus with other skin ulcer I50.42 Chronic combined systolic  (congestive) and diastolic (congestive) heart Modifier: yer exposed failure Quantity: Electronic Signature(s) Signed: 01/29/2021 2:04:45 PM By: Worthy Keeler PA-C Entered By: Worthy Keeler on 01/29/2021 14:04:44

## 2021-02-04 ENCOUNTER — Encounter: Payer: Medicare Other | Admitting: Internal Medicine

## 2021-02-04 ENCOUNTER — Other Ambulatory Visit: Payer: Self-pay

## 2021-02-04 DIAGNOSIS — W540XXA Bitten by dog, initial encounter: Secondary | ICD-10-CM | POA: Diagnosis not present

## 2021-02-04 DIAGNOSIS — I10 Essential (primary) hypertension: Secondary | ICD-10-CM | POA: Diagnosis not present

## 2021-02-04 DIAGNOSIS — I5042 Chronic combined systolic (congestive) and diastolic (congestive) heart failure: Secondary | ICD-10-CM | POA: Diagnosis not present

## 2021-02-04 DIAGNOSIS — I110 Hypertensive heart disease with heart failure: Secondary | ICD-10-CM | POA: Diagnosis not present

## 2021-02-04 DIAGNOSIS — L97812 Non-pressure chronic ulcer of other part of right lower leg with fat layer exposed: Secondary | ICD-10-CM | POA: Diagnosis not present

## 2021-02-04 DIAGNOSIS — E11622 Type 2 diabetes mellitus with other skin ulcer: Secondary | ICD-10-CM | POA: Diagnosis not present

## 2021-02-04 DIAGNOSIS — S81851A Open bite, right lower leg, initial encounter: Secondary | ICD-10-CM | POA: Diagnosis not present

## 2021-02-04 DIAGNOSIS — I480 Paroxysmal atrial fibrillation: Secondary | ICD-10-CM | POA: Diagnosis not present

## 2021-02-05 NOTE — Progress Notes (Signed)
NIHAR, KLUS ([id number]) Visit Report for 02/04/2021 Arrival Information Details Patient Name: Billy Franco, Billy Franco Date of Service: 02/04/2021 10:15 AM Medical Record Number: [id number] Patient Account Number: [id number] Date of Birth/Sex: February 28, 1940 (81 y.o. M) Treating RN: Carlene Coria Primary Care Preslei Blakley: Steele Sizer Other Clinician: Referring Marynell Bies: Steele Sizer Treating Adrienna Karis/Extender: Tito Dine in Treatment: 6 Visit Information History Since Last Visit All ordered tests and consults were completed: No Patient Arrived: Ambulatory Added or deleted any medications: No Arrival Time: 10:14 Any new allergies or adverse reactions: No Accompanied By: self Had a fall or experienced change in No Transfer Assistance: None activities of daily living that may affect Patient Identification Verified: Yes risk of falls: Secondary Verification Process Completed: Yes Signs or symptoms of abuse/neglect since last visito No Patient Requires Transmission-Based No Hospitalized since last visit: No Precautions: Implantable device outside of the clinic excluding No Patient Has Alerts: Yes cellular tissue based products placed in the center Patient Alerts: Patient on Blood since last visit: Thinner Has Dressing in Place as Prescribed: Yes Has Compression in Place as Prescribed: Yes Pain Present Now: No Electronic Signature(s) Signed: 02/05/2021 7:52:30 AM By: Carlene Coria RN Entered By: Carlene Coria on 02/04/2021 10:18:04 Billy Franco ([id number]) -------------------------------------------------------------------------------- Clinic Level of Care Assessment Details Patient Name: Billy Franco Date of Service: 02/04/2021 10:15 AM Medical Record Number: [id number] Patient Account Number: [id number] Date of Birth/Sex: 09/21/1940 (81 y.o. M) Treating RN: Carlene Coria Primary Care Tonye Tancredi: Steele Sizer Other Clinician: Referring Chasten Blaze: Steele Sizer Treating Mack Alvidrez/Extender: Tito Dine in Treatment: 6 Clinic Level of Care Assessment Items TOOL 4 Quantity Score X - Use when only an EandM is performed on FOLLOW-UP visit 1 0 ASSESSMENTS - Nursing Assessment / Reassessment X - Reassessment of Co-morbidities (includes updates in patient status) 1 10 X- 1 5 Reassessment of Adherence to Treatment Plan ASSESSMENTS - Wound and Skin Assessment / Reassessment X - Simple Wound Assessment / Reassessment - one wound 1 5 []  - 0 Complex Wound Assessment / Reassessment - multiple wounds []  - 0 Dermatologic / Skin Assessment (not related to wound area) ASSESSMENTS - Focused Assessment []  - Circumferential Edema Measurements - multi extremities 0 []  - 0 Nutritional Assessment / Counseling / Intervention []  - 0 Lower Extremity Assessment (monofilament, tuning fork, pulses) []  - 0 Peripheral Arterial Disease Assessment (using hand held doppler) ASSESSMENTS - Ostomy and/or Continence Assessment and Care []  - Incontinence Assessment and Management 0 []  - 0 Ostomy Care Assessment and Management (repouching, etc.) PROCESS - Coordination of Care X - Simple Patient / Family Education for ongoing care 1 15 []  - 0 Complex (extensive) Patient / Family Education for ongoing care []  - 0 Staff obtains Programmer, systems, Records, Test Results / Process Orders []  - 0 Staff telephones HHA, Nursing Homes / Clarify orders / etc []  - 0 Routine Transfer to another Facility (non-emergent condition) []  - 0 Routine Hospital Admission (non-emergent condition) []  - 0 New Admissions / Biomedical engineer / Ordering NPWT, Apligraf, etc. []  - 0 Emergency Hospital Admission (emergent condition) X- 1 10 Simple Discharge Coordination []  - 0 Complex (extensive) Discharge Coordination PROCESS - Special Needs []  - Pediatric / Minor Patient Management 0 []  - 0 Isolation Patient Management []  - 0 Hearing / Language / Visual special needs []  -  0 Assessment of Community assistance (transportation, D/C planning, etc.) []  - 0 Additional assistance / Altered mentation []  - 0 Support Surface(s) Assessment (bed, cushion, seat, etc.) INTERVENTIONS - Wound Cleansing / Measurement  Billy Franco, Billy Franco ([id number]) X- 1 5 Simple Wound Cleansing - one wound []  - 0 Complex Wound Cleansing - multiple wounds X- 1 5 Wound Imaging (photographs - any number of wounds) []  - 0 Wound Tracing (instead of photographs) X- 1 5 Simple Wound Measurement - one wound []  - 0 Complex Wound Measurement - multiple wounds INTERVENTIONS - Wound Dressings X - Small Wound Dressing one or multiple wounds 1 10 []  - 0 Medium Wound Dressing one or multiple wounds []  - 0 Large Wound Dressing one or multiple wounds X- 1 5 Application of Medications - topical []  - 0 Application of Medications - injection INTERVENTIONS - Miscellaneous []  - External ear exam 0 []  - 0 Specimen Collection (cultures, biopsies, blood, body fluids, etc.) []  - 0 Specimen(s) / Culture(s) sent or taken to Lab for analysis []  - 0 Patient Transfer (multiple staff / Civil Service fast streamer / Similar devices) []  - 0 Simple Staple / Suture removal (25 or less) []  - 0 Complex Staple / Suture removal (26 or more) []  - 0 Hypo / Hyperglycemic Management (close monitor of Blood Glucose) []  - 0 Ankle / Brachial Index (ABI) - do not check if billed separately X- 1 5 Vital Signs Has the patient been seen at the hospital within the last three years: Yes Total Score: 80 Level Of Care: New/Established - Level 3 Electronic Signature(s) Signed: 02/05/2021 7:52:30 AM By: Carlene Coria RN Entered By: Carlene Coria on 02/04/2021 10:27:57 Billy Franco ([id number]) -------------------------------------------------------------------------------- Encounter Discharge Information Details Patient Name: Billy Franco Date of Service: 02/04/2021 10:15 AM Medical Record Number: [id number] Patient Account Number:  [id number] Date of Birth/Sex: [birth year] (81 y.o. M) Treating RN: Carlene Coria Primary Care Sua Spadafora: Steele Sizer Other Clinician: Referring Yamilette Garretson: Steele Sizer Treating Lorence Nagengast/Extender: Tito Dine in Treatment: 6 Encounter Discharge Information Items Discharge Condition: Stable Ambulatory Status: Ambulatory Discharge Destination: Home Transportation: Private Auto Accompanied By: self Schedule Follow-up Appointment: Yes Clinical Summary of Care: Patient Declined Electronic Signature(s) Signed: 02/05/2021 7:52:30 AM By: Carlene Coria RN Entered By: Carlene Coria on 02/04/2021 10:29:10 Billy Franco ([id number]) -------------------------------------------------------------------------------- Lower Extremity Assessment Details Patient Name: Billy Franco Date of Service: 02/04/2021 10:15 AM Medical Record Number: [id number] Patient Account Number: [id number] Date of Birth/Sex: December 01, 1940 (80 y.o. M) Treating RN: Carlene Coria Primary Care Shernell Saldierna: Steele Sizer Other Clinician: Referring Aniah Pauli: Steele Sizer Treating Eydie Wormley/Extender: Tito Dine in Treatment: 6 Edema Assessment Assessed: [Left: No] [Right: No] [Left: Edema] [Right: :] Calf Left: Right: Point of Measurement: 36 cm From Medial Instep 32 cm Ankle Left: Right: Point of Measurement: 10 cm From Medial Instep 21 cm Vascular Assessment Pulses: Dorsalis Pedis Palpable: [Right:Yes] Electronic Signature(s) Signed: 02/05/2021 7:52:30 AM By: Carlene Coria RN Entered By: Carlene Coria on 02/04/2021 10:24:14 Billy Franco ([id number]) -------------------------------------------------------------------------------- Multi Wound Chart Details Patient Name: Billy Franco Date of Service: 02/04/2021 10:15 AM Medical Record Number: [id number] Patient Account Number: [id number] Date of Birth/Sex: 1940/08/11 (80 y.o. M) Treating RN: Carlene Coria Primary Care Daana Petrasek: Steele Sizer  Other Clinician: Referring Adaiah Morken: Steele Sizer Treating Pegge Cumberledge/Extender: Tito Dine in Treatment: 6 Vital Signs Height(in): 67 Pulse(bpm): 91 Weight(lbs): 150 Blood Pressure(mmHg): 121/64 Body Mass Index(BMI): 23 Temperature(F): 98.2 Respiratory Rate(breaths/min): 18 Photos: [N/A:N/A] Wound Location: Right, Lateral Lower Leg N/A N/A Wounding Event: Bite N/A N/A Primary Etiology: Diabetic Wound/Ulcer of the Lower N/A N/A Extremity Comorbid History: Congestive Heart Failure, N/A N/A Hypertension, Type II Diabetes Date Acquired: 11/16/2020 N/A N/A Weeks of Treatment: 6 N/A N/A Wound Status: Open  N/A N/A Measurements L x W x D (cm) 1.5x0.6x0.2 N/A N/A Area (cm) : 0.707 N/A N/A Volume (cm) : 0.141 N/A N/A % Reduction in Area: 62.50% N/A N/A % Reduction in Volume: 85.00% N/A N/A Classification: Grade 2 N/A N/A Exudate Amount: Medium N/A N/A Exudate Type: Serosanguineous N/A N/A Exudate Color: red, brown N/A N/A Granulation Amount: Large (67-100%) N/A N/A Granulation Quality: Red N/A N/A Necrotic Amount: Small (1-33%) N/A N/A Exposed Structures: Fat Layer (Subcutaneous Tissue): N/A N/A Yes Fascia: No Tendon: No Muscle: No Joint: No Bone: No Epithelialization: None N/A N/A Treatment Notes Electronic Signature(s) Signed: 02/05/2021 7:52:30 AM By: Carlene Coria RN Entered By: Carlene Coria on 02/04/2021 10:25:54 Billy Franco ([id number]) -------------------------------------------------------------------------------- Multi-Disciplinary Care Plan Details Patient Name: Billy Franco Date of Service: 02/04/2021 10:15 AM Medical Record Number: [id number] Patient Account Number: [id number] Date of Birth/Sex: Oct 27, 1940 (80 y.o. M) Treating RN: Carlene Coria Primary Care Kendria Halberg: Steele Sizer Other Clinician: Referring Vic Esco: Steele Sizer Treating Jayveion Stalling/Extender: Tito Dine in Treatment: 6 Active Inactive Wound/Skin  Impairment Nursing Diagnoses: Knowledge deficit related to ulceration/compromised skin integrity Goals: Patient/caregiver will verbalize understanding of skin care regimen Date Initiated: 12/24/2020 Target Resolution Date: 01/24/2021 Goal Status: Active Ulcer/skin breakdown will have a volume reduction of 30% by week 4 Date Initiated: 12/24/2020 Target Resolution Date: 02/24/2021 Goal Status: Active Ulcer/skin breakdown will have a volume reduction of 50% by week 8 Date Initiated: 12/24/2020 Target Resolution Date: 03/24/2021 Goal Status: Active Ulcer/skin breakdown will have a volume reduction of 80% by week 12 Date Initiated: 12/24/2020 Target Resolution Date: 04/24/2021 Goal Status: Active Ulcer/skin breakdown will heal within 14 weeks Date Initiated: 12/24/2020 Target Resolution Date: 05/24/2021 Goal Status: Active Interventions: Assess patient/caregiver ability to obtain necessary supplies Assess patient/caregiver ability to perform ulcer/skin care regimen upon admission and as needed Assess ulceration(s) every visit Notes: Electronic Signature(s) Signed: 02/05/2021 7:52:30 AM By: Carlene Coria RN Entered By: Carlene Coria on 02/04/2021 10:25:39 Billy Franco ([id number]) -------------------------------------------------------------------------------- Pain Assessment Details Patient Name: Billy Franco Date of Service: 02/04/2021 10:15 AM Medical Record Number: [id number] Patient Account Number: [id number] Date of Birth/Sex: 07/09/1940 (80 y.o. M) Treating RN: Carlene Coria Primary Care Karalina Tift: Steele Sizer Other Clinician: Referring Keonna Raether: Steele Sizer Treating Erlin Gardella/Extender: Tito Dine in Treatment: 6 Active Problems Location of Pain Severity and Description of Pain Patient Has Paino No Site Locations Pain Management and Medication Current Pain Management: Electronic Signature(s) Signed: 02/05/2021 7:52:30 AM By: Carlene Coria RN Entered By: Carlene Coria on 02/04/2021 10:18:36 Billy Franco ([id number]) -------------------------------------------------------------------------------- Patient/Caregiver Education Details Patient Name: Billy Franco Date of Service: 02/04/2021 10:15 AM Medical Record Number: [id number] Patient Account Number: [id number] Date of Birth/Gender: [birth year] (80 y.o. M) Treating RN: Carlene Coria Primary Care Physician: Steele Sizer Other Clinician: Referring Physician: Steele Sizer Treating Physician/Extender: Tito Dine in Treatment: 6 Education Assessment Education Provided To: Patient Education Topics Provided Wound/Skin Impairment: Methods: Explain/Verbal Responses: State content correctly Electronic Signature(s) Signed: 02/05/2021 7:52:30 AM By: Carlene Coria RN Entered By: Carlene Coria on 02/04/2021 10:28:17 Billy Franco ([id number]) -------------------------------------------------------------------------------- Wound Assessment Details Patient Name: Billy Franco Date of Service: 02/04/2021 10:15 AM Medical Record Number: [id number] Patient Account Number: [id number] Date of Birth/Sex: [birth year] (80 y.o. M) Treating RN: Carlene Coria Primary Care Calirose Mccance: Steele Sizer Other Clinician: Referring Sedric Guia: Steele Sizer Treating Suprina Mandeville/Extender: Tito Dine in Treatment: 6 Wound Status Wound Number: 1 Primary Etiology: Diabetic Wound/Ulcer of the Lower Extremity Wound Location: Right, Lateral Lower Leg Wound Status: Open Wounding  Event: Bite Comorbid Congestive Heart Failure, Hypertension, Type II History: Diabetes Date Acquired: 11/16/2020 Weeks Of Treatment: 6 Clustered Wound: No Photos Wound Measurements Length: (cm) 1.5 Width: (cm) 0.6 Depth: (cm) 0.2 Area: (cm) 0.707 Volume: (cm) 0.141 % Reduction in Area: 62.5% % Reduction in Volume: 85% Epithelialization: None Tunneling: No Undermining: No Wound Description Classification:  Grade 2 Exudate Amount: Medium Exudate Type: Serosanguineous Exudate Color: red, brown Foul Odor After Cleansing: No Slough/Fibrino Yes Wound Bed Granulation Amount: Large (67-100%) Exposed Structure Granulation Quality: Red Fascia Exposed: No Necrotic Amount: Small (1-33%) Fat Layer (Subcutaneous Tissue) Exposed: Yes Necrotic Quality: Adherent Slough Tendon Exposed: No Muscle Exposed: No Joint Exposed: No Bone Exposed: No Treatment Notes Wound #1 (Lower Leg) Wound Laterality: Right, Lateral Cleanser [place] Ancillary Kit - 15 Day Supply Discharge Instruction: Use supplies as instructed; Kit contains: (15) Saline Bullets; (15) 3x3 Gauze; 15 pr Gloves Soap and Water Discharge Instruction: Gently cleanse wound with antibacterial soap, rinse and pat dry prior to dressing wounds Billy Franco, Billy Franco ([id number]) Peri-Wound Care Topical Primary Dressing Prisma 4.34 (in) Discharge Instruction: Moisten w/normal saline or sterile water; Cover wound as directed. Do not remove from wound bed. Secondary Dressing ABD Pad 5x9 (in/in) Discharge Instruction: Cover with ABD pad Secured With 40M [employer] Surgical Tape, 2x2 (in/yd) Compression Wrap tubi grip Discharge Instruction: size c Compression Stockings Add-Ons Electronic Signature(s) Signed: 02/05/2021 7:52:30 AM By: Carlene Coria RN Entered By: Carlene Coria on 02/04/2021 10:23:20 Billy Franco ([id number]) -------------------------------------------------------------------------------- Vitals Details Patient Name: Billy Franco Date of Service: 02/04/2021 10:15 AM Medical Record Number: [id number] Patient Account Number: [id number] Date of Birth/Sex: 30-Jun-1940 (80 y.o. M) Treating RN: Carlene Coria Primary Care Anessa Charley: Steele Sizer Other Clinician: Referring Faithann Natal: Steele Sizer Treating Sinaya Minogue/Extender: Tito Dine in Treatment: 6 Vital Signs Time Taken: 10:18 Temperature (F): 98.2 Height  (in): 67 Pulse (bpm): 64 Weight (lbs): 150 Respiratory Rate (breaths/min): 18 Body Mass Index (BMI): 23.5 Blood Pressure (mmHg): 121/64 Reference Range: 80 - 120 mg / dl Electronic Signature(s) Signed: 02/05/2021 7:52:30 AM By: Carlene Coria RN Entered By: Carlene Coria on 02/04/2021 10:18:22

## 2021-02-05 NOTE — Progress Notes (Signed)
CARMINO, OCAIN ([id number]) Visit Report for 02/04/2021 HPI Details Patient Name: Billy Franco, Billy Franco Date of Service: 02/04/2021 10:15 AM Medical Record Number: [id number] Patient Account Number: [id number] Date of Birth/Sex: July 12, 1940 (81 y.o. M) Treating RN: Carlene Coria Primary Care Provider: Steele Sizer Other Clinician: Referring Provider: Steele Sizer Treating Provider/Extender: Tito Dine in Treatment: 6 History of Present Illness HPI Description: 12/24/2020 upon evaluation today patient appears to be doing somewhat poorly in regard to the wound that actually occurred around November 13 2020. This was actually a dog bite he is unsure of what type of dog it was it was a small wound that wound was not a familiar dog to him. Subsequently he ended up being seen by his primary care provider in October 28 by his primary care provider. Subsequently they originally placed him on 2 rounds of amoxicillin he is also been on doxycycline. Unfortunately this just does not seem to be getting better despite the treatment up to this point they have been initially cleaning with alcohol although that has been discontinued when he went to the ER on November 14. They actually recommended cleaning with just soap and water. There is really not been any specific dressings that were recommended at this time. Subsequently the patient does have an ABI of 1.3 here in the clinic today. This is on the right. Subsequently also has a recent hemoglobin A1c from when he was at the ER of 6.3. He really is not having any significant pain which is good news he does have some swelling which is going to need to be addressed as well. This is minimal however I do not think he has to have a compression wrap I think Tubigrip would probably be appropriate. Patient does have a history of diabetes mellitus type 2, congestive heart failure, atrial fibrillation, and hypertension. 01/01/2021 upon evaluation today patient  appears to be doing pretty well in regard to his wound. Fortunately I do not see any signs of infection and in 1 week's time I feel like we have made some good progress here. No fevers, chills, nausea, vomiting, or diarrhea. 01/08/2021 upon evaluation today patient's wound at this point is showing signs of doing really about the same. I am not seeing as much improvement as I would like to see. I think at this point that I would recommend we see about actually using a different medication I am thinking Iodosorb could be beneficial for him. He is in agreement with given something else to try here. 12/27; use Iodoflex starting last week. Wound surface looks better but no major change in dimensions 01/24/2021 upon evaluation today patient appears to be doing well with regard to his leg ulcer. I am actually seeing signs of significant improvement and very pleased in this regard. I do not see any evidence of active infection which is great news. No fevers, chills, nausea, vomiting, or diarrhea. 01/29/2021 upon evaluation today patient appears to be doing well with regard to his leg ulcer. This is not significantly smaller but is also not any larger I do think at this point switching over to the silver collagen to see if we get this to speed up would be a good option. We have been using the Iodoflex which I think has been helpful but nonetheless I think we might be able to do something to speed this up a bit at this point. 02/04/21; right lateral calf which was initially a dog bite. We have been using silver collagen covered in Tubigrip  changing 3 times a week. They are moistening this with saline but apparently the collagen is staying moist. Dimensions down by half a centimeter Electronic Signature(s) Signed: 02/05/2021 7:38:21 AM By: Linton Ham MD Entered By: Linton Ham on 02/04/2021 10:35:45 Billy Franco  ([id number]) -------------------------------------------------------------------------------- Physical Exam Details Patient Name: Billy Franco Date of Service: 02/04/2021 10:15 AM Medical Record Number: [id number] Patient Account Number: [id number] Date of Birth/Sex: [birth year] (80 y.o. M) Treating RN: Carlene Coria Primary Care Provider: Steele Sizer Other Clinician: Referring Provider: Steele Sizer Treating Provider/Extender: Tito Dine in Treatment: 6 Constitutional Sitting or standing Blood Pressure is within target range for patient.. Pulse regular and within target range for patient.Marland Kitchen Respirations regular, non- labored and within target range.. Temperature is normal and within the target range for the patient.Marland Kitchen appears in no distress. Cardiovascular Pedal pulses are palpable. Notes Wound exam; wound looks healthy. There is still some depth. No surrounding erythema. No debridement was required. We have good edema control Electronic Signature(s) Signed: 02/05/2021 7:38:21 AM By: Linton Ham MD Entered By: Linton Ham on 02/04/2021 10:38:11 Billy Franco ([id number]) -------------------------------------------------------------------------------- Physician Orders Details Patient Name: Billy Franco Date of Service: 02/04/2021 10:15 AM Medical Record Number: [id number] Patient Account Number: [id number] Date of Birth/Sex: [birth year] (80 y.o. M) Treating RN: Carlene Coria Primary Care Provider: Steele Sizer Other Clinician: Referring Provider: Steele Sizer Treating Provider/Extender: Tito Dine in Treatment: 6 Verbal / Phone Orders: No Diagnosis Coding Follow-up Appointments o Return Appointment in 1 week. Bathing/ Shower/ Hygiene o May shower; gently cleanse wound with antibacterial soap, rinse and pat dry prior to dressing wounds - may shower on days dressing changed, remove dressing, shower, reapply dressing Edema Control -  Lymphedema / Segmental Compressive Device / Other o Tubigrip single layer applied. - side c o Elevate, Exercise Daily and Avoid Standing for Long Periods of Time. o Elevate legs to the level of the heart and pump ankles as often as possible o Elevate leg(s) parallel to the floor when sitting. Wound Treatment Wound #1 - Lower Leg Wound Laterality: Right, Lateral Cleanser: Byram Ancillary Kit - 15 Day Supply (Generic) 3 x Per Week/30 Days Discharge Instructions: Use supplies as instructed; Kit contains: (15) Saline Bullets; (15) 3x3 Gauze; 15 pr Gloves Cleanser: Soap and Water 3 x Per Week/30 Days Discharge Instructions: Gently cleanse wound with antibacterial soap, rinse and pat dry prior to dressing wounds Primary Dressing: Prisma 4.34 (in) 3 x Per Week/30 Days Discharge Instructions: Moisten w/normal saline or sterile water; Cover wound as directed. Do not remove from wound bed. Secondary Dressing: ABD Pad 5x9 (in/in) (Generic) 3 x Per Week/30 Days Discharge Instructions: Cover with ABD pad Secured With: 45M Medipore H Soft Cloth Surgical Tape, 2x2 (in/yd) (Generic) 3 x Per Week/30 Days Compression Wrap: tubi grip 3 x Per Week/30 Days Discharge Instructions: size c Electronic Signature(s) Signed: 02/05/2021 7:38:21 AM By: Linton Ham MD Signed: 02/05/2021 7:52:30 AM By: Carlene Coria RN Entered By: Carlene Coria on 02/04/2021 10:26:52 Billy Franco ([id number]) -------------------------------------------------------------------------------- Problem List Details Patient Name: Billy Franco Date of Service: 02/04/2021 10:15 AM Medical Record Number: [id number] Patient Account Number: [id number] Date of Birth/Sex: 04-25-1940 (80 y.o. 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 M) Treating RN: Carlene Coria Primary Care Provider: Steele Sizer Other Clinician: Referring Provider: Steele Sizer Treating Provider/Extender: Tito Dine in Treatment: 6 Subjective History of Present Illness (HPI) 12/24/2020 upon evaluation today patient appears to be doing somewhat poorly in regard to the wound that actually occurred around November 13 2020. This was actually a dog bite he is unsure of what type of dog it was it was a small wound that wound was not a familiar dog to him. Subsequently he ended up being seen by his primary care provider in October 28 by his primary care provider. Subsequently they originally placed him on 2 rounds of amoxicillin he is also been on doxycycline. Unfortunately this just does not seem to be getting better despite the treatment up to this point they have been initially cleaning with alcohol although that has been discontinued when he went to the ER on November 14. They actually recommended cleaning with  just soap and water. There is really not been any specific dressings that were recommended at this time. Subsequently the patient does have an ABI of 1.3 here in the clinic today. This is on the right. Subsequently also has a recent hemoglobin A1c from when he was at the ER of 6.3. He really is not having any significant pain which is good news he does have some swelling which is going to need to be addressed as well. This is minimal however I do not think he has to have a compression wrap I think Tubigrip would probably be appropriate. Patient does have a history of diabetes mellitus type 2, congestive heart failure, atrial fibrillation, and hypertension. 01/01/2021 upon evaluation today patient appears to be doing pretty well in regard to his wound. Fortunately I do not see any signs of infection and in 1 week's time I feel like we have made some good progress here. No fevers, chills, nausea, vomiting, or diarrhea. 01/08/2021 upon evaluation today patient's wound at this point is showing signs of doing really about the same. I am not seeing as much improvement as I would like to see. I think at this point that I would recommend we see about actually using a different medication I am thinking Iodosorb could be beneficial for him. He is in agreement with given something else to try here. 12/27; use Iodoflex starting last week. Wound surface looks better but no major change in dimensions 01/24/2021 upon evaluation today patient appears to be doing well with regard to his leg ulcer. I am actually seeing signs of significant improvement and very pleased in this regard. I do not see any evidence of active infection which is great news. No fevers, chills, nausea, vomiting, or diarrhea. 01/29/2021 upon evaluation today patient appears to be doing well with regard to his leg ulcer. This is not significantly smaller but is also not any larger I do think at this point switching over to the silver collagen  to see  if we get this to speed up would be a good option. We have been using the Iodoflex which I think has been helpful but nonetheless I think we might be able to do something to speed this up a bit at this point. 02/04/21; right lateral calf which was initially a dog bite. We have been using silver collagen covered in Tubigrip changing 3 times a week. They are moistening this with saline but apparently the collagen is staying moist. Dimensions down by half a centimeter Objective Constitutional Sitting or standing Blood Pressure is within target range for patient.. Pulse regular and within target range for patient.Marland Kitchen Respirations regular, non- labored and within target range.. Temperature is normal and within the target range for the patient.Marland Kitchen appears in no distress. Vitals Time Taken: 10:18 AM, Height: 67 in, Weight: 150 lbs, BMI: 23.5, Temperature: 98.2 F, Pulse: 64 bpm, Respiratory Rate: 18 breaths/min, Blood Pressure: 121/64 mmHg. Cardiovascular Pedal pulses are palpable. General Notes: Wound exam; wound looks healthy. There is still some depth. No surrounding erythema. No debridement was required. We have good edema control Integumentary (Hair, Skin) Wound #1 status is Open. Original cause of wound was Bite. The date acquired was: 11/16/2020. The wound has been in treatment 6 weeks. The wound is located on the Right,Lateral Lower Leg. The wound measures 1.5cm length x 0.6cm width x 0.2cm depth; 0.707cm^2 area and 0.141cm^3 volume. There is Fat Layer (Subcutaneous Tissue) exposed. There is no tunneling or undermining noted. There is a medium amount of serosanguineous drainage noted. There is large (67-100%) red granulation within the wound bed. There is a small (1-33%) amount of necrotic Strick, Exavior ([id number]) tissue within the wound bed including Adherent Slough. 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 Silver collagen to continue./ABD Tubigrip. They are changing this 3 times a week. 2. The wound seems to be contracting no debridement was required. No evidence of infection Follow-up Appointments: Return Appointment in 1 week. Bathing/ Shower/ Hygiene: May shower; gently cleanse wound with antibacterial soap, rinse and pat dry prior to dressing wounds - may shower on days dressing changed, remove dressing, shower, reapply dressing Edema Control - Lymphedema / Segmental Compressive Device / Other: Tubigrip single layer applied. - side c Elevate, Exercise Daily and Avoid Standing for Long Periods of Time. Elevate legs to the level of the heart and pump ankles as often as possible Elevate leg(s) parallel to the floor when sitting. WOUND #1: - Lower Leg Wound Laterality: Right, Lateral Cleanser: Byram Ancillary Kit - 15 Day Supply (Generic) 3 x Per Week/30 Days Discharge Instructions: Use supplies as instructed; Kit contains: (15) Saline Bullets; (15) 3x3 Gauze; 15 pr Gloves Cleanser: Soap and Water 3 x Per Week/30 Days Discharge Instructions: Gently cleanse wound with antibacterial soap, rinse and pat dry prior to dressing wounds Primary Dressing: Prisma 4.34 (in) 3 x Per Week/30 Days Discharge Instructions: Moisten w/normal saline or sterile water; Cover wound as directed. Do not remove from wound bed. Secondary Dressing: ABD Pad 5x9 (in/in) (Generic) 3 x Per Week/30 Days Discharge Instructions: Cover with ABD pad Secured With: 4M Medipore H Soft Cloth Surgical Tape, 2x2 (in/yd) (Generic) 3 x Per Week/30 Days Compression Wrap: tubi grip 3 x Per Week/30 Days Discharge Instructions: size  c Electronic Signature(s) Signed: 02/05/2021 7:38:21 AM By: Linton Ham  MD Entered By: Linton Ham on 02/04/2021 10:39:01 Billy Franco ([id number]) -------------------------------------------------------------------------------- SuperBill Details Patient Name: Billy Franco Date of Service: 02/04/2021 Medical Record Number: [id number] Patient Account Number: [id number] Date of Birth/Sex: 10/05/1940 (80 y.o. M) Treating RN: Carlene Coria Primary Care Provider: Steele Sizer Other Clinician: Referring Provider: Steele Sizer Treating Provider/Extender: Tito Dine in Treatment: 6 Diagnosis Coding ICD-10 Codes Code Description W54.0XXA Bitten by dog, initial encounter [phone] Non-pressure chronic ulcer of other part of right lower leg with fat layer exposed E11.622 Type 2 diabetes mellitus with other skin ulcer I50.42 Chronic combined systolic (congestive) and diastolic (congestive) heart failure I48.0 Paroxysmal atrial fibrillation I10 Essential (primary) hypertension Facility Procedures CPT4 Code: [id number] Description: 99213 - WOUND CARE VISIT-LEV 3 EST PT Modifier: Quantity: 1 Physician Procedures CPT4 Code: [id number] Description: 97964 - WC PHYS LEVEL 3 - EST PT Modifier: Quantity: 1 CPT4 Code: Description: ICD-10 Diagnosis Description Z89.373 Non-pressure chronic ulcer of other part of right lower leg with fat la W54.0XXA Bitten by dog, initial encounter Modifier: yer exposed Quantity: Electronic Signature(s) Signed: 02/05/2021 7:38:21 AM By: Linton Ham MD Entered By: Linton Ham on 02/04/2021 10:39:22

## 2021-02-11 ENCOUNTER — Other Ambulatory Visit: Payer: Self-pay

## 2021-02-11 ENCOUNTER — Encounter: Payer: Medicare Other | Admitting: Physician Assistant

## 2021-02-11 DIAGNOSIS — I110 Hypertensive heart disease with heart failure: Secondary | ICD-10-CM | POA: Diagnosis not present

## 2021-02-11 DIAGNOSIS — E11622 Type 2 diabetes mellitus with other skin ulcer: Secondary | ICD-10-CM | POA: Diagnosis not present

## 2021-02-11 DIAGNOSIS — I10 Essential (primary) hypertension: Secondary | ICD-10-CM | POA: Diagnosis not present

## 2021-02-11 DIAGNOSIS — S81851A Open bite, right lower leg, initial encounter: Secondary | ICD-10-CM | POA: Diagnosis not present

## 2021-02-11 DIAGNOSIS — I480 Paroxysmal atrial fibrillation: Secondary | ICD-10-CM | POA: Diagnosis not present

## 2021-02-11 DIAGNOSIS — L97812 Non-pressure chronic ulcer of other part of right lower leg with fat layer exposed: Secondary | ICD-10-CM | POA: Diagnosis not present

## 2021-02-11 DIAGNOSIS — I5042 Chronic combined systolic (congestive) and diastolic (congestive) heart failure: Secondary | ICD-10-CM | POA: Diagnosis not present

## 2021-02-11 NOTE — Progress Notes (Addendum)
Billy Franco, Billy Franco ([id number]) Visit Report for 02/11/2021 Chief Complaint Document Details Patient Name: Billy Franco, Billy Franco Date of Service: 02/11/2021 8:00 AM Medical Record Number: [id number] Patient Account Number: [id number] Date of Birth/Sex: August 16, 1940 (81 y.o. M) Treating RN: Carlene Coria Primary Care Provider: Steele Sizer Other Clinician: Referring Provider: Steele Sizer Treating Provider/Extender: Skipper Cliche in Treatment: 7 Information Obtained from: Patient Chief Complaint Right Leg Dog Bite Electronic Signature(s) Signed: 02/11/2021 8:18:09 AM By: Worthy Keeler PA-C Entered By: Worthy Keeler on 02/11/2021 08:18:09 Billy Franco ([id number]) -------------------------------------------------------------------------------- HPI Details Patient Name: Billy Franco Date of Service: 02/11/2021 8:00 AM Medical Record Number: [id number] Patient Account Number: [id number] Date of Birth/Sex: 05/27/1940 (80 y.o. M) Treating RN: Carlene Coria Primary Care Provider: Steele Sizer Other Clinician: Referring Provider: Steele Sizer Treating Provider/Extender: Skipper Cliche in Treatment: 7 History of Present Illness HPI Description: 12/24/2020 upon evaluation today patient appears to be doing somewhat poorly in regard to the wound that actually occurred around November 13 2020. This was actually a dog bite he is unsure of what type of dog it was it was a small wound that wound was not a familiar dog to him. Subsequently he ended up being seen by his primary care provider in October 28 by his primary care provider. Subsequently they originally placed him on 2 rounds of amoxicillin he is also been on doxycycline. Unfortunately this just does not seem to be getting better despite the treatment up to this point they have been initially cleaning with alcohol although that has been discontinued when he went to the ER on November 14. They actually recommended cleaning with just  soap and water. There is really not been any specific dressings that were recommended at this time. Subsequently the patient does have an ABI of 1.3 here in the clinic today. This is on the right. Subsequently also has a recent hemoglobin A1c from when he was at the ER of 6.3. He really is not having any significant pain which is good news he does have some swelling which is going to need to be addressed as well. This is minimal however I do not think he has to have a compression wrap I think Tubigrip would probably be appropriate. Patient does have a history of diabetes mellitus type 2, congestive heart failure, atrial fibrillation, and hypertension. 01/01/2021 upon evaluation today patient appears to be doing pretty well in regard to his wound. Fortunately I do not see any signs of infection and in 1 week's time I feel like we have made some good progress here. No fevers, chills, nausea, vomiting, or diarrhea. 01/08/2021 upon evaluation today patient's wound at this point is showing signs of doing really about the same. I am not seeing as much improvement as I would like to see. I think at this point that I would recommend we see about actually using a different medication I am thinking Iodosorb could be beneficial for him. He is in agreement with given something else to try here. 12/27; use Iodoflex starting last week. Wound surface looks better but no major change in dimensions 01/24/2021 upon evaluation today patient appears to be doing well with regard to his leg ulcer. I am actually seeing signs of significant improvement and very pleased in this regard. I do not see any evidence of active infection which is great news. No fevers, chills, nausea, vomiting, or diarrhea. 01/29/2021 upon evaluation today patient appears to be doing well with regard to his leg ulcer. This is not significantly  smaller but is also not any larger I do think at this point switching over to the silver collagen to see if  we get this to speed up would be a good option. We have been using the Iodoflex which I think has been helpful but nonetheless I think we might be able to do something to speed this up a bit at this point. 02/04/21; right lateral calf which was initially a dog bite. We have been using silver collagen covered in Tubigrip changing 3 times a week. They are moistening this with saline but apparently the collagen is staying moist. Dimensions down by half a centimeter 02/11/2021 upon evaluation today patient's wound on the leg actually appears to be doing quite well. I am actually very pleased with where things stand today. I do not see any evidence of active infection locally nor systemically which is great news. Overall I feel like that we are making excellent progress and I am extremely pleased with where we stand at this point. He does have some irritation from the tape we can work on keeping tape off of the skin going forward. Electronic Signature(s) Signed: 02/11/2021 8:29:03 AM By: Worthy Keeler PA-C Entered By: Worthy Keeler on 02/11/2021 08:29:02 Billy Franco, Billy Franco ([id number]) -------------------------------------------------------------------------------- Physical Exam Details Patient Name: Billy Franco Date of Service: 02/11/2021 8:00 AM Medical Record Number: [id number] Patient Account Number: [id number] Date of Birth/Sex: 04/05/1940 (80 y.o. M) Treating RN: Carlene Coria Primary Care Provider: Steele Sizer Other Clinician: Referring Provider: Steele Sizer Treating Provider/Extender: Skipper Cliche in Treatment: 7 Constitutional Well-nourished and well-hydrated in no acute distress. Respiratory normal breathing without difficulty. Psychiatric this patient is able to make decisions and demonstrates good insight into disease process. Alert and Oriented x 3. pleasant and cooperative. Notes Upon inspection patient's wound bed actually showed signs of good granulation and  epithelization at this point. Fortunately there does not appear to be any evidence of active infection locally nor systemically at this time. No fevers, chills, nausea, vomiting, or diarrhea. Electronic Signature(s) Signed: 02/11/2021 8:29:17 AM By: Worthy Keeler PA-C Entered By: Worthy Keeler on 02/11/2021 08:29:17 Billy Franco, Billy Franco ([id number]) -------------------------------------------------------------------------------- Physician Orders Details Patient Name: Billy Franco Date of Service: 02/11/2021 8:00 AM Medical Record Number: [id number] Patient Account Number: [id number] Date of Birth/Sex: [birth year] (80 y.o. M) Treating RN: Carlene Coria Primary Care Provider: Steele Sizer Other Clinician: Referring Provider: Steele Sizer Treating Provider/Extender: Skipper Cliche in Treatment: 7 Verbal / Phone Orders: No Diagnosis Coding ICD-10 Coding Code Description W54.0XXA Bitten by dog, initial encounter [phone] Non-pressure chronic ulcer of other part of right lower leg with fat layer exposed E11.622 Type 2 diabetes mellitus with other skin ulcer I50.42 Chronic combined systolic (congestive) and diastolic (congestive) heart failure I48.0 Paroxysmal atrial fibrillation I10 Essential (primary) hypertension Follow-up Appointments o Return Appointment in 1 week. Bathing/ Shower/ Hygiene o May shower; gently cleanse wound with antibacterial soap, rinse and pat dry prior to dressing wounds - may shower on days dressing changed, remove dressing, shower, reapply dressing Edema Control - Lymphedema / Segmental Compressive Device / Other o Tubigrip single layer applied. - side c o Elevate, Exercise Daily and Avoid Standing for Long Periods of Time. o Elevate legs to the level of the heart and pump ankles as often as possible o Elevate leg(s) parallel to the floor when sitting. o Other: - mepitel to shin Wound Treatment Wound #1 - Lower Leg Wound Laterality: Right,  Lateral Cleanser: Byram Ancillary Kit - 15 Day Supply (  Generic) 3 x Per Week/30 Days Discharge Instructions: Use supplies as instructed; Kit contains: (15) Saline Bullets; (15) 3x3 Gauze; 15 pr Gloves Cleanser: Soap and Water 3 x Per Week/30 Days Discharge Instructions: Gently cleanse wound with antibacterial soap, rinse and pat dry prior to dressing wounds Primary Dressing: Prisma 4.34 (in) 3 x Per Week/30 Days Discharge Instructions: Moisten w/normal saline or sterile water; Cover wound as directed. Do not remove from wound bed. Secondary Dressing: Gauze 3 x Per Week/30 Days Discharge Instructions: As directed: dry, moistened with saline or moistened with Dakins Solution Secured With: 51M Medipore H Soft Cloth Surgical Tape, 2x2 (in/yd) (Generic) 3 x Per Week/30 Days Discharge Instructions: no tape on skin Compression Wrap: tubi grip 3 x Per Week/30 Days Discharge Instructions: size c Electronic Signature(s) Signed: 02/11/2021 4:34:41 PM By: Worthy Keeler PA-C Signed: 02/12/2021 1:59:18 PM By: Carlene Coria RN Entered By: Carlene Coria on 02/11/2021 08:25:27 Billy Franco ([id number]) -------------------------------------------------------------------------------- Problem List Details Patient Name: Billy Franco Date of Service: 02/11/2021 8:00 AM Medical Record Number: [id number] Patient Account Number: [id number] Date of Birth/Sex: [birth year] (80 y.o. M) Treating RN: Carlene Coria Primary Care Provider: Steele Sizer Other Clinician: Referring Provider: Steele Sizer Treating Provider/Extender: Skipper Cliche in Treatment: 7 Active Problems ICD-10 Encounter Code Description Active Date MDM Diagnosis W54.0XXA Bitten by dog, initial encounter 12/24/2020 No Yes L97.812 Non-pressure chronic ulcer of other part of right lower leg with fat layer 12/24/2020 No Yes exposed E11.622 Type 2 diabetes mellitus with other skin ulcer 12/24/2020 No Yes I50.42 Chronic combined systolic  (congestive) and diastolic (congestive) heart 12/24/2020 No Yes failure I48.0 Paroxysmal atrial fibrillation 12/24/2020 No Yes I10 Essential (primary) hypertension 12/24/2020 No Yes Inactive Problems Resolved Problems Electronic Signature(s) Signed: 02/11/2021 8:18:03 AM By: Worthy Keeler PA-C Entered By: Worthy Keeler on 02/11/2021 08:18:02 Billy Franco ([id number]) -------------------------------------------------------------------------------- Progress Note Details Patient Name: Billy Franco Date of Service: 02/11/2021 8:00 AM Medical Record Number: [id number] Patient Account Number: [id number] Date of Birth/Sex: 08-10-1940 (80 y.o. M) Treating RN: Carlene Coria Primary Care Provider: Steele Sizer Other Clinician: Referring Provider: Steele Sizer Treating Provider/Extender: Skipper Cliche in Treatment: 7 Subjective Chief Complaint Information obtained from Patient Right Leg Dog Bite History of Present Illness (HPI) 12/24/2020 upon evaluation today patient appears to be doing somewhat poorly in regard to the wound that actually occurred around November 13 2020. This was actually a dog bite he is unsure of what type of dog it was it was a small wound that wound was not a familiar dog to him. Subsequently he ended up being seen by his primary care provider in October 28 by his primary care provider. Subsequently they originally placed him on 2 rounds of amoxicillin he is also been on doxycycline. Unfortunately this just does not seem to be getting better despite the treatment up to this point they have been initially cleaning with alcohol although that has been discontinued when he went to the ER on November 14. They actually recommended cleaning with just soap and water. There is really not been any specific dressings that were recommended at this time. Subsequently the patient does have an ABI of 1.3 here in the clinic today. This is on the right. Subsequently also has a  recent hemoglobin A1c from when he was at the ER of 6.3. He really is not having any significant pain which is good news he does have some swelling which is going to need to be addressed as well. This is minimal  however I do not think he has to have a compression wrap I think Tubigrip would probably be appropriate. Patient does have a history of diabetes mellitus type 2, congestive heart failure, atrial fibrillation, and hypertension. 01/01/2021 upon evaluation today patient appears to be doing pretty well in regard to his wound. Fortunately I do not see any signs of infection and in 1 week's time I feel like we have made some good progress here. No fevers, chills, nausea, vomiting, or diarrhea. 01/08/2021 upon evaluation today patient's wound at this point is showing signs of doing really about the same. I am not seeing as much improvement as I would like to see. I think at this point that I would recommend we see about actually using a different medication I am thinking Iodosorb could be beneficial for him. He is in agreement with given something else to try here. 12/27; use Iodoflex starting last week. Wound surface looks better but no major change in dimensions 01/24/2021 upon evaluation today patient appears to be doing well with regard to his leg ulcer. I am actually seeing signs of significant improvement and very pleased in this regard. I do not see any evidence of active infection which is great news. No fevers, chills, nausea, vomiting, or diarrhea. 01/29/2021 upon evaluation today patient appears to be doing well with regard to his leg ulcer. This is not significantly smaller but is also not any larger I do think at this point switching over to the silver collagen to see if we get this to speed up would be a good option. We have been using the Iodoflex which I think has been helpful but nonetheless I think we might be able to do something to speed this up a bit at this point. 02/04/21; right  lateral calf which was initially a dog bite. We have been using silver collagen covered in Tubigrip changing 3 times a week. They are moistening this with saline but apparently the collagen is staying moist. Dimensions down by half a centimeter 02/11/2021 upon evaluation today patient's wound on the leg actually appears to be doing quite well. I am actually very pleased with where things stand today. I do not see any evidence of active infection locally nor systemically which is great news. Overall I feel like that we are making excellent progress and I am extremely pleased with where we stand at this point. He does have some irritation from the tape we can work on keeping tape off of the skin going forward. Objective Constitutional Well-nourished and well-hydrated in no acute distress. Vitals Time Taken: 8:14 AM, Height: 67 in, Weight: 150 lbs, BMI: 23.5, Temperature: 98 F, Pulse: 71 bpm, Respiratory Rate: 18 breaths/min, Blood Pressure: 114/63 mmHg. Respiratory normal breathing without difficulty. Psychiatric Billy Franco, Billy Franco ([id number]) this patient is able to make decisions and demonstrates good insight into disease process. Alert and Oriented x 3. pleasant and cooperative. General Notes: Upon inspection patient's wound bed actually showed signs of good granulation and epithelization at this point. Fortunately there does not appear to be any evidence of active infection locally nor systemically at this time. No fevers, chills, nausea, vomiting, or diarrhea. Integumentary (Hair, Skin) Wound #1 status is Open. Original cause of wound was Bite. The date acquired was: 11/16/2020. The wound has been in treatment 7 weeks. The wound is located on the Right,Lateral Lower Leg. The wound measures 1cm length x 0.5cm width x 0.2cm depth; 0.393cm^2 area and 0.079cm^3 volume. There is Fat Layer (Subcutaneous Tissue) exposed. There  is no tunneling or undermining noted. There is a medium amount of  serosanguineous drainage noted. There is large (67-100%) red granulation within the wound bed. There is a small (1-33%) amount of necrotic tissue within the wound bed including Adherent Slough. Assessment Active Problems ICD-10 Bitten by dog, initial encounter Non-pressure chronic ulcer of other part of right lower leg with fat layer exposed Type 2 diabetes mellitus with other skin ulcer Chronic combined systolic (congestive) and diastolic (congestive) heart failure Paroxysmal atrial fibrillation Essential (primary) hypertension Plan Follow-up Appointments: Return Appointment in 1 week. Bathing/ Shower/ Hygiene: May shower; gently cleanse wound with antibacterial soap, rinse and pat dry prior to dressing wounds - may shower on days dressing changed, remove dressing, shower, reapply dressing Edema Control - Lymphedema / Segmental Compressive Device / Other: Tubigrip single layer applied. - side c Elevate, Exercise Daily and Avoid Standing for Long Periods of Time. Elevate legs to the level of the heart and pump ankles as often as possible Elevate leg(s) parallel to the floor when sitting. Other: - mepitel to shin WOUND #1: - Lower Leg Wound Laterality: Right, Lateral Cleanser: Byram Ancillary Kit - 15 Day Supply (Generic) 3 x Per Week/30 Days Discharge Instructions: Use supplies as instructed; Kit contains: (15) Saline Bullets; (15) 3x3 Gauze; 15 pr Gloves Cleanser: Soap and Water 3 x Per Week/30 Days Discharge Instructions: Gently cleanse wound with antibacterial soap, rinse and pat dry prior to dressing wounds Primary Dressing: Prisma 4.34 (in) 3 x Per Week/30 Days Discharge Instructions: Moisten w/normal saline or sterile water; Cover wound as directed. Do not remove from wound bed. Secondary Dressing: Gauze 3 x Per Week/30 Days Discharge Instructions: As directed: dry, moistened with saline or moistened with Dakins Solution Secured With: 57M Medipore H Soft Cloth Surgical Tape, 2x2  (in/yd) (Generic) 3 x Per Week/30 Days Discharge Instructions: no tape on skin Compression Wrap: tubi grip 3 x Per Week/30 Days Discharge Instructions: size c 1. I am going to suggest that we going continue with the wound care measures as before and the patient is in agreement with plan. This includes the use of the silver collagen dressing which I think is doing a good job. 2. We actually used a piece of Mepitel over the area that blistered it actually appears to be doing quite well but nonetheless this will protect it. 3. I am also going to suggest that we use gauze to put over top of the collagen and then make sure we do not have any tape on the skin itself. We will then subsequently utilize the Tubigrip over top which I think is doing an awesome job. We will see patient back for reevaluation in 1 week here in the clinic. If anything worsens or changes patient will contact our office for additional recommendations. Electronic Signature(s) Signed: 02/11/2021 8:29:55 AM By: Elisabeth Cara, Billy Franco ([id number]) Entered By: Worthy Keeler on 02/11/2021 08:29:54 Billy Franco ([id number]) -------------------------------------------------------------------------------- SuperBill Details Patient Name: Billy Franco Date of Service: 02/11/2021 Medical Record Number: [id number] Patient Account Number: [id number] Date of Birth/Sex: [birth year] (80 y.o. M) Treating RN: Carlene Coria Primary Care Provider: Steele Sizer Other Clinician: Referring Provider: Steele Sizer Treating Provider/Extender: Skipper Cliche in Treatment: 7 Diagnosis Coding ICD-10 Codes Code Description [phone] Bitten by dog, initial encounter [phone] Non-pressure chronic ulcer of other part of right lower leg with fat layer exposed E11.622 Type 2 diabetes mellitus with other skin ulcer I50.42 Chronic combined systolic (congestive) and diastolic (congestive) heart failure I48.0  Paroxysmal atrial  fibrillation I10 Essential (primary) hypertension Facility Procedures CPT4 Code: [id number] Description: 99213 - WOUND CARE VISIT-LEV 3 EST PT Modifier: Quantity: 1 Physician Procedures CPT4 Code: [id number] Description: 74827 - WC PHYS LEVEL 4 - EST PT Modifier: Quantity: 1 CPT4 Code: Description: ICD-10 Diagnosis Description W54.0XXA Bitten by dog, initial encounter [phone] Non-pressure chronic ulcer of other part of right lower leg with fat la E11.622 Type 2 diabetes mellitus with other skin ulcer I50.42 Chronic combined systolic  (congestive) and diastolic (congestive) heart Modifier: yer exposed failure Quantity: Electronic Signature(s) Signed: 02/11/2021 8:30:12 AM By: Worthy Keeler PA-C Entered By: Worthy Keeler on 02/11/2021 08:30:12

## 2021-02-12 NOTE — Progress Notes (Signed)
NEWELL, WAFER ([id number]) Visit Report for 02/11/2021 Arrival Information Details Patient Name: Billy Franco, Billy Franco Date of Service: 02/11/2021 8:00 AM Medical Record Number: [id number] Patient Account Number: [id number] Date of Birth/Sex: 09-Apr-1940 (81 y.o. M) Treating RN: Carlene Coria Primary Care Sirenia Whitis: Steele Sizer Other Clinician: Referring Arthuro Canelo: Steele Sizer Treating Madellyn Denio/Extender: Skipper Cliche in Treatment: 7 Visit Information History Since Last Visit All ordered tests and consults were completed: No Patient Arrived: Ambulatory Added or deleted any medications: No Arrival Time: 08:03 Any new allergies or adverse reactions: No Accompanied By: wife Had a fall or experienced change in No Transfer Assistance: None activities of daily living that may affect Patient Identification Verified: Yes risk of falls: Secondary Verification Process Completed: Yes Signs or symptoms of abuse/neglect since last visito No Patient Requires Transmission-Based No Hospitalized since last visit: No Precautions: Implantable device outside of the clinic excluding No Patient Has Alerts: Yes cellular tissue based products placed in the center Patient Alerts: Patient on Blood since last visit: Thinner Has Dressing in Place as Prescribed: Yes Pain Present Now: No Electronic Signature(s) Signed: 02/12/2021 1:59:18 PM By: Carlene Coria RN Entered By: Carlene Coria on 02/11/2021 08:13:51 Billy Franco ([id number]) -------------------------------------------------------------------------------- Clinic Level of Care Assessment Details Patient Name: Billy Franco Date of Service: 02/11/2021 8:00 AM Medical Record Number: [id number] Patient Account Number: [id number] Date of Birth/Sex: 1941-01-08 (80 y.o. M) Treating RN: Carlene Coria Primary Care Jacee Enerson: Steele Sizer Other Clinician: Referring Arilyn Brierley: Steele Sizer Treating Arron Mcnaught/Extender: Skipper Cliche in  Treatment: 7 Clinic Level of Care Assessment Items TOOL 4 Quantity Score X - Use when only an EandM is performed on FOLLOW-UP visit 1 0 ASSESSMENTS - Nursing Assessment / Reassessment X - Reassessment of Co-morbidities (includes updates in patient status) 1 10 X- 1 5 Reassessment of Adherence to Treatment Plan ASSESSMENTS - Wound and Skin Assessment / Reassessment X - Simple Wound Assessment / Reassessment - one wound 1 5 []  - 0 Complex Wound Assessment / Reassessment - multiple wounds []  - 0 Dermatologic / Skin Assessment (not related to wound area) ASSESSMENTS - Focused Assessment []  - Circumferential Edema Measurements - multi extremities 0 []  - 0 Nutritional Assessment / Counseling / Intervention []  - 0 Lower Extremity Assessment (monofilament, tuning fork, pulses) []  - 0 Peripheral Arterial Disease Assessment (using hand held doppler) ASSESSMENTS - Ostomy and/or Continence Assessment and Care []  - Incontinence Assessment and Management 0 []  - 0 Ostomy Care Assessment and Management (repouching, etc.) PROCESS - Coordination of Care X - Simple Patient / Family Education for ongoing care 1 15 []  - 0 Complex (extensive) Patient / Family Education for ongoing care []  - 0 Staff obtains Programmer, systems, Records, Test Results / Process Orders []  - 0 Staff telephones HHA, Nursing Homes / Clarify orders / etc []  - 0 Routine Transfer to another Facility (non-emergent condition) []  - 0 Routine Hospital Admission (non-emergent condition) []  - 0 New Admissions / Biomedical engineer / Ordering NPWT, Apligraf, etc. []  - 0 Emergency Hospital Admission (emergent condition) X- 1 10 Simple Discharge Coordination []  - 0 Complex (extensive) Discharge Coordination PROCESS - Special Needs []  - Pediatric / Minor Patient Management 0 []  - 0 Isolation Patient Management []  - 0 Hearing / Language / Visual special needs []  - 0 Assessment of Community assistance (transportation, D/C  planning, etc.) []  - 0 Additional assistance / Altered mentation []  - 0 Support Surface(s) Assessment (bed, cushion, seat, etc.) INTERVENTIONS - Wound Cleansing / Measurement Kluth, Fotios ([id number]) X- 1 5 Simple Wound Cleansing -  one wound []  - 0 Complex Wound Cleansing - multiple wounds X- 1 5 Wound Imaging (photographs - any number of wounds) []  - 0 Wound Tracing (instead of photographs) X- 1 5 Simple Wound Measurement - one wound []  - 0 Complex Wound Measurement - multiple wounds INTERVENTIONS - Wound Dressings X - Small Wound Dressing one or multiple wounds 1 10 []  - 0 Medium Wound Dressing one or multiple wounds []  - 0 Large Wound Dressing one or multiple wounds X- 1 5 Application of Medications - topical []  - 0 Application of Medications - injection INTERVENTIONS - Miscellaneous []  - External ear exam 0 []  - 0 Specimen Collection (cultures, biopsies, blood, body fluids, etc.) []  - 0 Specimen(s) / Culture(s) sent or taken to Lab for analysis []  - 0 Patient Transfer (multiple staff / Civil Service fast streamer / Similar devices) []  - 0 Simple Staple / Suture removal (25 or less) []  - 0 Complex Staple / Suture removal (26 or more) []  - 0 Hypo / Hyperglycemic Management (close monitor of Blood Glucose) []  - 0 Ankle / Brachial Index (ABI) - do not check if billed separately X- 1 5 Vital Signs Has the patient been seen at the hospital within the last three years: Yes Total Score: 80 Level Of Care: New/Established - Level 3 Electronic Signature(s) Signed: 02/12/2021 1:59:18 PM By: Carlene Coria RN Entered By: Carlene Coria on 02/11/2021 08:25:53 Billy Franco ([id number]) -------------------------------------------------------------------------------- Encounter Discharge Information Details Patient Name: Billy Franco Date of Service: 02/11/2021 8:00 AM Medical Record Number: [id number] Patient Account Number: [id number] Date of Birth/Sex: 1940/07/20 (80 y.o. M) Treating  RN: Carlene Coria Primary Care Eilyn Polack: Steele Sizer Other Clinician: Referring Shakiyah Cirilo: Steele Sizer Treating Nevah Dalal/Extender: Skipper Cliche in Treatment: 7 Encounter Discharge Information Items Discharge Condition: Stable Ambulatory Status: Ambulatory Discharge Destination: Home Transportation: Private Auto Accompanied By: wife Schedule Follow-up Appointment: Yes Clinical Summary of Care: Patient Declined Electronic Signature(s) Signed: 02/12/2021 1:59:18 PM By: Carlene Coria RN Entered By: Carlene Coria on 02/11/2021 08:26:53 Billy Franco ([id number]) -------------------------------------------------------------------------------- Lower Extremity Assessment Details Patient Name: Billy Franco Date of Service: 02/11/2021 8:00 AM Medical Record Number: [id number] Patient Account Number: [id number] Date of Birth/Sex: [birth year] (80 y.o. M) Treating RN: Carlene Coria Primary Care Jorja Empie: Steele Sizer Other Clinician: Referring Daphnee Preiss: Steele Sizer Treating Jalaina Salyers/Extender: Skipper Cliche in Treatment: 7 Edema Assessment Assessed: [Left: No] [Right: No] Edema: [Left: Ye] [Right: s] Calf Left: Right: Point of Measurement: 36 cm From Medial Instep 32 cm Ankle Left: Right: Point of Measurement: 10 cm From Medial Instep 21 cm Vascular Assessment Pulses: Dorsalis Pedis Palpable: [Right:Yes] Electronic Signature(s) Signed: 02/12/2021 1:59:18 PM By: Carlene Coria RN Entered By: Carlene Coria on 02/11/2021 08:20:26 Billy Franco ([id number]) -------------------------------------------------------------------------------- Multi Wound Chart Details Patient Name: Billy Franco Date of Service: 02/11/2021 8:00 AM Medical Record Number: [id number] Patient Account Number: [id number] Date of Birth/Sex: 1940/03/05 (80 y.o. M) Treating RN: Carlene Coria Primary Care Secily Walthour: Steele Sizer Other Clinician: Referring Dearl Rudden: Steele Sizer Treating  Leiyah Maultsby/Extender: Skipper Cliche in Treatment: 7 Vital Signs Height(in): 41 Pulse(bpm): 22 Weight(lbs): 150 Blood Pressure(mmHg): 114/63 Body Mass Index(BMI): 23.5 Temperature(F): 98 Respiratory Rate(breaths/min): 18 Photos: [N/A:N/A] Wound Location: Right, Lateral Lower Leg N/A N/A Wounding Event: Bite N/A N/A Primary Etiology: Diabetic Wound/Ulcer of the Lower N/A N/A Extremity Comorbid History: Congestive Heart Failure, N/A N/A Hypertension, Type II Diabetes Date Acquired: 11/16/2020 N/A N/A Weeks of Treatment: 7 N/A N/A Wound Status: Open N/A N/A Wound Recurrence: No N/A N/A Measurements L x W x  D (cm) 1x0.5x0.2 N/A N/A Area (cm) : 0.393 N/A N/A Volume (cm) : 0.079 N/A N/A % Reduction in Area: 79.20% N/A N/A % Reduction in Volume: 91.60% N/A N/A Classification: Grade 2 N/A N/A Exudate Amount: Medium N/A N/A Exudate Type: Serosanguineous N/A N/A Exudate Color: red, brown N/A N/A Granulation Amount: Large (67-100%) N/A N/A Granulation Quality: Red N/A N/A Necrotic Amount: Small (1-33%) N/A N/A Exposed Structures: Fat Layer (Subcutaneous Tissue): N/A N/A Yes Fascia: No Tendon: No Muscle: No Joint: No Bone: No Epithelialization: None N/A N/A Treatment Notes Electronic Signature(s) Signed: 02/12/2021 1:59:18 PM By: Carlene Coria RN Entered By: Carlene Coria on 02/11/2021 08:23:44 Billy Franco ([id number]) -------------------------------------------------------------------------------- Multi-Disciplinary Care Plan Details Patient Name: Billy Franco Date of Service: 02/11/2021 8:00 AM Medical Record Number: [id number] Patient Account Number: [id number] Date of Birth/Sex: 1940/10/11 (80 y.o. M) Treating RN: Carlene Coria Primary Care [place] Millon: Steele Sizer Other Clinician: Referring Halah Whiteside: Steele Sizer Treating Aracelis Ulrey/Extender: Skipper Cliche in Treatment: 7 Active Inactive Wound/Skin Impairment Nursing Diagnoses: Knowledge deficit related  to ulceration/compromised skin integrity Goals: Patient/caregiver will verbalize understanding of skin care regimen Date Initiated: 12/24/2020 Target Resolution Date: 01/24/2021 Goal Status: Active Ulcer/skin breakdown will have a volume reduction of 30% by week 4 Date Initiated: 12/24/2020 Target Resolution Date: 02/24/2021 Goal Status: Active Ulcer/skin breakdown will have a volume reduction of 50% by week 8 Date Initiated: 12/24/2020 Target Resolution Date: 03/24/2021 Goal Status: Active Ulcer/skin breakdown will have a volume reduction of 80% by week 12 Date Initiated: 12/24/2020 Target Resolution Date: 04/24/2021 Goal Status: Active Ulcer/skin breakdown will heal within 14 weeks Date Initiated: 12/24/2020 Target Resolution Date: 05/24/2021 Goal Status: Active Interventions: Assess patient/caregiver ability to obtain necessary supplies Assess patient/caregiver ability to perform ulcer/skin care regimen upon admission and as needed Assess ulceration(s) every visit Notes: Electronic Signature(s) Signed: 02/12/2021 1:59:18 PM By: Carlene Coria RN Entered By: Carlene Coria on 02/11/2021 08:23:34 Billy Franco ([id number]) -------------------------------------------------------------------------------- Pain Assessment Details Patient Name: Billy Franco Date of Service: 02/11/2021 8:00 AM Medical Record Number: [id number] Patient Account Number: [id number] Date of Birth/Sex: [birth year] (80 y.o. M) Treating RN: Carlene Coria Primary Care Ethlyn Alto: Steele Sizer Other Clinician: Referring Marjo Grosvenor: Steele Sizer Treating Marysa Wessner/Extender: Skipper Cliche in Treatment: 7 Active Problems Location of Pain Severity and Description of Pain Patient Has Paino No Site Locations Pain Management and Medication Current Pain Management: Electronic Signature(s) Signed: 02/12/2021 1:59:18 PM By: Carlene Coria RN Entered By: Carlene Coria on 02/11/2021 08:14:23 Billy Franco  ([id number]) -------------------------------------------------------------------------------- Patient/Caregiver Education Details Patient Name: Billy Franco Date of Service: 02/11/2021 8:00 AM Medical Record Number: [id number] Patient Account Number: [id number] Date of Birth/Gender: May 23, 1940 (80 y.o. M) Treating RN: Carlene Coria Primary Care Physician: Steele Sizer Other Clinician: Referring Physician: Steele Sizer Treating Physician/Extender: Skipper Cliche in Treatment: 7 Education Assessment Education Provided To: Patient Education Topics Provided Wound/Skin Impairment: Methods: Explain/Verbal Responses: State content correctly Electronic Signature(s) Signed: 02/12/2021 1:59:18 PM By: Carlene Coria RN Entered By: Carlene Coria on 02/11/2021 08:26:11 Billy Franco ([id number]) -------------------------------------------------------------------------------- Wound Assessment Details Patient Name: Billy Franco Date of Service: 02/11/2021 8:00 AM Medical Record Number: [id number] Patient Account Number: [id number] Date of Birth/Sex: 1940/11/27 (80 y.o. M) Treating RN: Carlene Coria Primary Care Sharelle Burditt: Steele Sizer Other Clinician: Referring Celine Dishman: Steele Sizer Treating Kahmya Pinkham/Extender: Skipper Cliche in Treatment: 7 Wound Status Wound Number: 1 Primary Etiology: Diabetic Wound/Ulcer of the Lower Extremity Wound Location: Right, Lateral Lower Leg Wound Status: Open Wounding Event: Bite Comorbid Congestive Heart Failure, Hypertension, Type II History: Diabetes  Date Acquired: 11/16/2020 Weeks Of Treatment: 7 Clustered Wound: No Photos Wound Measurements Length: (cm) 1 Width: (cm) 0.5 Depth: (cm) 0.2 Area: (cm) 0.393 Volume: (cm) 0.079 % Reduction in Area: 79.2% % Reduction in Volume: 91.6% Epithelialization: None Tunneling: No Undermining: No Wound Description Classification: Grade 2 Exudate Amount: Medium Exudate Type:  Serosanguineous Exudate Color: red, brown Foul Odor After Cleansing: No Slough/Fibrino Yes Wound Bed Granulation Amount: Large (67-100%) Exposed Structure Granulation Quality: Red Fascia Exposed: No Necrotic Amount: Small (1-33%) Fat Layer (Subcutaneous Tissue) Exposed: Yes Necrotic Quality: Adherent Slough Tendon Exposed: No Muscle Exposed: No Joint Exposed: No Bone Exposed: No Treatment Notes Wound #1 (Lower Leg) Wound Laterality: Right, Lateral Cleanser [place] Ancillary Kit - 15 Day Supply Discharge Instruction: Use supplies as instructed; Kit contains: (15) Saline Bullets; (15) 3x3 Gauze; 15 pr Gloves Soap and Water Discharge Instruction: Gently cleanse wound with antibacterial soap, rinse and pat dry prior to dressing wounds Montesano, Jaskaran ([id number]) Peri-Wound Care Topical Primary Dressing Prisma 4.34 (in) Discharge Instruction: Moisten w/normal saline or sterile water; Cover wound as directed. Do not remove from wound bed. Secondary Dressing Gauze Discharge Instruction: As directed: dry, moistened with saline or moistened with Dakins Solution Secured With 81M Medipore H Soft Cloth Surgical Tape, 2x2 (in/yd) Discharge Instruction: no tape on skin Compression Wrap tubi grip Discharge Instruction: size c Compression Stockings Add-Ons Electronic Signature(s) Signed: 02/12/2021 1:59:18 PM By: Carlene Coria RN Entered By: Carlene Coria on 02/11/2021 08:19:51 Billy Franco ([id number]) -------------------------------------------------------------------------------- Vitals Details Patient Name: Billy Franco Date of Service: 02/11/2021 8:00 AM Medical Record Number: [id number] Patient Account Number: [id number] Date of Birth/Sex: 10-01-1940 (80 y.o. M) Treating RN: Carlene Coria Primary Care Dion Sibal: Steele Sizer Other Clinician: Referring Brittley Regner: Steele Sizer Treating Tanyla Stege/Extender: Skipper Cliche in Treatment: 7 Vital Signs Time Taken:  08:14 Temperature (F): 98 Height (in): 67 Pulse (bpm): 71 Weight (lbs): 150 Respiratory Rate (breaths/min): 18 Body Mass Index (BMI): 23.5 Blood Pressure (mmHg): 114/63 Reference Range: 80 - 120 mg / dl Electronic Signature(s) Signed: 02/12/2021 1:59:18 PM By: Carlene Coria RN Entered By: Carlene Coria on 02/11/2021 08:14:15

## 2021-02-18 ENCOUNTER — Other Ambulatory Visit: Payer: Self-pay

## 2021-02-18 ENCOUNTER — Encounter: Payer: Medicare Other | Admitting: Physician Assistant

## 2021-02-18 DIAGNOSIS — L97812 Non-pressure chronic ulcer of other part of right lower leg with fat layer exposed: Secondary | ICD-10-CM | POA: Diagnosis not present

## 2021-02-18 DIAGNOSIS — I480 Paroxysmal atrial fibrillation: Secondary | ICD-10-CM | POA: Diagnosis not present

## 2021-02-18 DIAGNOSIS — I110 Hypertensive heart disease with heart failure: Secondary | ICD-10-CM | POA: Diagnosis not present

## 2021-02-18 DIAGNOSIS — S81851A Open bite, right lower leg, initial encounter: Secondary | ICD-10-CM | POA: Diagnosis not present

## 2021-02-18 DIAGNOSIS — I5042 Chronic combined systolic (congestive) and diastolic (congestive) heart failure: Secondary | ICD-10-CM | POA: Diagnosis not present

## 2021-02-18 DIAGNOSIS — I10 Essential (primary) hypertension: Secondary | ICD-10-CM | POA: Diagnosis not present

## 2021-02-18 DIAGNOSIS — E11622 Type 2 diabetes mellitus with other skin ulcer: Secondary | ICD-10-CM | POA: Diagnosis not present

## 2021-02-18 NOTE — Progress Notes (Addendum)
Billy Franco ([id number]) Visit Report for 02/18/2021 Chief Complaint Document Details Patient Name: Billy Franco, Billy Franco Date of Service: 02/18/2021 10:15 AM Medical Record Number: [id number] Patient Account Number: [id number] Date of Birth/Sex: 1941-01-17 (81 y.o. M) Treating RN: Billy Franco Primary Care Provider: Steele Franco Other Clinician: Referring Provider: Steele Franco Treating Provider/Extender: Billy Franco in Treatment: 8 Information Obtained from: Patient Chief Complaint Right Leg Dog Bite Electronic Signature(s) Signed: 02/18/2021 9:58:32 AM By: Billy Keeler PA-C Entered By: Billy Franco on 02/18/2021 09:58:31 Billy Franco ([id number]) -------------------------------------------------------------------------------- HPI Details Patient Name: Billy Franco Date of Service: 02/18/2021 10:15 AM Medical Record Number: [id number] Patient Account Number: [id number] Date of Birth/Sex: [birth year] (80 y.o. M) Treating RN: Billy Franco Primary Care Provider: Steele Franco Other Clinician: Referring Provider: Steele Franco Treating Provider/Extender: Billy Franco in Treatment: 8 History of Present Illness HPI Description: 12/24/2020 upon evaluation today patient appears to be doing somewhat poorly in regard to the wound that actually occurred around November 13 2020. This was actually a dog bite he is unsure of what type of dog it was it was a small wound that wound was not a familiar dog to him. Subsequently he ended up being seen by his primary care provider in October 28 by his primary care provider. Subsequently they originally placed him on 2 rounds of amoxicillin he is also been on doxycycline. Unfortunately this just does not seem to be getting better despite the treatment up to this point they have been initially cleaning with alcohol although that has been discontinued when he went to the ER on November 14. They actually recommended cleaning with just  soap and water. There is really not been any specific dressings that were recommended at this time. Subsequently the patient does have an ABI of 1.3 here in the clinic today. This is on the right. Subsequently also has a recent hemoglobin A1c from when he was at the ER of 6.3. He really is not having any significant pain which is good news he does have some swelling which is going to need to be addressed as well. This is minimal however I do not think he has to have a compression wrap I think Tubigrip would probably be appropriate. Patient does have a history of diabetes mellitus type 2, congestive heart failure, atrial fibrillation, and hypertension. 01/01/2021 upon evaluation today patient appears to be doing pretty well in regard to his wound. Fortunately I do not see any signs of infection and in 1 week's time I feel like we have made some good progress here. No fevers, chills, nausea, vomiting, or diarrhea. 01/08/2021 upon evaluation today patient's wound at this point is showing signs of doing really about the same. I am not seeing as much improvement as I would like to see. I think at this point that I would recommend we see about actually using a different medication I am thinking Iodosorb could be beneficial for him. He is in agreement with given something else to try here. 12/27; use Iodoflex starting last week. Wound surface looks better but no major change in dimensions 01/24/2021 upon evaluation today patient appears to be doing well with regard to his leg ulcer. I am actually seeing signs of significant improvement and very pleased in this regard. I do not see any evidence of active infection which is great news. No fevers, chills, nausea, vomiting, or diarrhea. 01/29/2021 upon evaluation today patient appears to be doing well with regard to his leg ulcer. This is not significantly  smaller but is also not any larger I do think at this point switching over to the silver collagen to see if  we get this to speed up would be a good option. We have been using the Iodoflex which I think has been helpful but nonetheless I think we might be able to do something to speed this up a bit at this point. 02/04/21; right lateral calf which was initially a dog bite. We have been using silver collagen covered in Tubigrip changing 3 times a week. They are moistening this with saline but apparently the collagen is staying moist. Dimensions down by half a centimeter 02/11/2021 upon evaluation today patient's wound on the leg actually appears to be doing quite well. I am actually very pleased with where things stand today. I do not see any evidence of active infection locally nor systemically which is great news. Overall I feel like that we are making excellent progress and I am extremely pleased with where we stand at this point. He does have some irritation from the tape we can work on keeping tape off of the skin going forward. 02/18/2021 upon evaluation today patient appears to be doing well with regard to his wound. He has been tolerating the dressing changes without complication. Fortunately I do not see any signs of active infection locally nor systemically at this time which is great news. No fevers, chills, nausea, vomiting, or diarrhea. Electronic Signature(s) Signed: 02/18/2021 10:21:16 AM By: Billy Keeler PA-C Entered By: Billy Franco on 02/18/2021 10:21:16 Billy Franco ([id number]) -------------------------------------------------------------------------------- Physical Exam Details Patient Name: Billy Franco Date of Service: 02/18/2021 10:15 AM Medical Record Number: [id number] Patient Account Number: [id number] Date of Birth/Sex: [birth year] (80 y.o. M) Treating RN: Billy Franco Primary Care Provider: Steele Franco Other Clinician: Referring Provider: Steele Franco Treating Provider/Extender: Billy Franco in Treatment: 8 Constitutional Well-nourished and well-hydrated  in no acute distress. Respiratory normal breathing without difficulty. Psychiatric this patient is able to make decisions and demonstrates good insight into disease process. Alert and Oriented x 3. pleasant and cooperative. Notes Upon inspection patient's wound bed showed signs of good granulation and epithelization at this point. Fortunately I do not see any evidence of active infection locally nor systemically which is great news and overall I am very pleased I think that he has a lot of new granulation growth and overall we are very close to getting this completely covered with granulation which will be the first step in getting the skin completely covering the area as well even so were already significantly smaller compared to where he was. Electronic Signature(s) Signed: 02/18/2021 10:21:49 AM By: Billy Keeler PA-C Entered By: Billy Franco on 02/18/2021 10:21:48 Billy Franco ([id number]) -------------------------------------------------------------------------------- Physician Orders Details Patient Name: Billy Franco Date of Service: 02/18/2021 10:15 AM Medical Record Number: [id number] Patient Account Number: [id number] Date of Birth/Sex: 1940-09-17 (80 y.o. M) Treating RN: Billy Franco Primary Care Provider: Steele Franco Other Clinician: Referring Provider: Steele Franco Treating Provider/Extender: Billy Franco in Treatment: 8 Verbal / Phone Orders: No Diagnosis Coding ICD-10 Coding Code Description W54.0XXA Bitten by dog, initial encounter [phone] Non-pressure chronic ulcer of other part of right lower leg with fat layer exposed E11.622 Type 2 diabetes mellitus with other skin ulcer I50.42 Chronic combined systolic (congestive) and diastolic (congestive) heart failure I48.0 Paroxysmal atrial fibrillation I10 Essential (primary) hypertension Follow-up Appointments o Return Appointment in 1 week. Bathing/ Shower/ Hygiene o May shower; gently cleanse  wound with antibacterial soap, rinse  and pat dry prior to dressing wounds - may shower on days dressing changed, remove dressing, shower, reapply dressing Edema Control - Lymphedema / Segmental Compressive Device / Other o Tubigrip single layer applied. - side c o Elevate, Exercise Daily and Avoid Standing for Long Periods of Time. o Elevate legs to the level of the heart and pump ankles as often as possible o Elevate leg(s) parallel to the floor when sitting. o Other: - mepitel to shin Wound Treatment Wound #1 - Lower Leg Wound Laterality: Right, Lateral Cleanser: Byram Ancillary Kit - 15 Day Supply (Generic) 3 x Per Week/30 Days Discharge Instructions: Use supplies as instructed; Kit contains: (15) Saline Bullets; (15) 3x3 Gauze; 15 pr Gloves Cleanser: Soap and Water 3 x Per Week/30 Days Discharge Instructions: Gently cleanse wound with antibacterial soap, rinse and pat dry prior to dressing wounds Primary Dressing: Prisma 4.34 (in) 3 x Per Week/30 Days Discharge Instructions: Moisten w/normal saline or sterile water; Cover wound as directed. Do not remove from wound bed. Secondary Dressing: Gauze 3 x Per Week/30 Days Discharge Instructions: As directed: dry, moistened with saline or moistened with Dakins Solution Secured With: 65M Medipore H Soft Cloth Surgical Tape, 2x2 (in/yd) (Generic) 3 x Per Week/30 Days Discharge Instructions: no tape on skin Compression Wrap: tubi grip 3 x Per Week/30 Days Discharge Instructions: size c Electronic Signature(s) Signed: 02/18/2021 3:58:09 PM By: Billy Coria RN Signed: 02/18/2021 5:27:53 PM By: Billy Keeler PA-C Entered By: Billy Franco on 02/18/2021 10:17:33 Billy Franco ([id number]) -------------------------------------------------------------------------------- Problem List Details Patient Name: Billy Franco Date of Service: 02/18/2021 10:15 AM Medical Record Number: [id number] Patient Account Number: [id number] Date of  Birth/Sex: [birth year] (80 y.o. M) Treating RN: Billy Franco Primary Care Provider: Steele Franco Other Clinician: Referring Provider: Steele Franco Treating Provider/Extender: Billy Franco in Treatment: 8 Active Problems ICD-10 Encounter Code Description Active Date MDM Diagnosis W54.0XXA Bitten by dog, initial encounter 12/24/2020 No Yes L97.812 Non-pressure chronic ulcer of other part of right lower leg with fat layer 12/24/2020 No Yes exposed E11.622 Type 2 diabetes mellitus with other skin ulcer 12/24/2020 No Yes I50.42 Chronic combined systolic (congestive) and diastolic (congestive) heart 12/24/2020 No Yes failure I48.0 Paroxysmal atrial fibrillation 12/24/2020 No Yes I10 Essential (primary) hypertension 12/24/2020 No Yes Inactive Problems Resolved Problems Electronic Signature(s) Signed: 02/18/2021 9:58:25 AM By: Billy Keeler PA-C Entered By: Billy Franco on 02/18/2021 09:58:25 Billy Franco ([id number]) -------------------------------------------------------------------------------- Progress Note Details Patient Name: Billy Franco Date of Service: 02/18/2021 10:15 AM Medical Record Number: [id number] Patient Account Number: [id number] Date of Birth/Sex: 1940/11/30 (80 y.o. M) Treating RN: Billy Franco Primary Care Provider: Steele Franco Other Clinician: Referring Provider: Steele Franco Treating Provider/Extender: Billy Franco in Treatment: 8 Subjective Chief Complaint Information obtained from Patient Right Leg Dog Bite History of Present Illness (HPI) 12/24/2020 upon evaluation today patient appears to be doing somewhat poorly in regard to the wound that actually occurred around November 13 2020. This was actually a dog bite he is unsure of what type of dog it was it was a small wound that wound was not a familiar dog to him. Subsequently he ended up being seen by his primary care provider in October 28 by his primary care provider. Subsequently  they originally placed him on 2 rounds of amoxicillin he is also been on doxycycline. Unfortunately this just does not seem to be getting better despite the treatment up to this point they have been initially cleaning with alcohol although that has  been discontinued when he went to the ER on November 14. They actually recommended cleaning with just soap and water. There is really not been any specific dressings that were recommended at this time. Subsequently the patient does have an ABI of 1.3 here in the clinic today. This is on the right. Subsequently also has a recent hemoglobin A1c from when he was at the ER of 6.3. He really is not having any significant pain which is good news he does have some swelling which is going to need to be addressed as well. This is minimal however I do not think he has to have a compression wrap I think Tubigrip would probably be appropriate. Patient does have a history of diabetes mellitus type 2, congestive heart failure, atrial fibrillation, and hypertension. 01/01/2021 upon evaluation today patient appears to be doing pretty well in regard to his wound. Fortunately I do not see any signs of infection and in 1 week's time I feel like we have made some good progress here. No fevers, chills, nausea, vomiting, or diarrhea. 01/08/2021 upon evaluation today patient's wound at this point is showing signs of doing really about the same. I am not seeing as much improvement as I would like to see. I think at this point that I would recommend we see about actually using a different medication I am thinking Iodosorb could be beneficial for him. He is in agreement with given something else to try here. 12/27; use Iodoflex starting last week. Wound surface looks better but no major change in dimensions 01/24/2021 upon evaluation today patient appears to be doing well with regard to his leg ulcer. I am actually seeing signs of significant improvement and very pleased in this  regard. I do not see any evidence of active infection which is great news. No fevers, chills, nausea, vomiting, or diarrhea. 01/29/2021 upon evaluation today patient appears to be doing well with regard to his leg ulcer. This is not significantly smaller but is also not any larger I do think at this point switching over to the silver collagen to see if we get this to speed up would be a good option. We have been using the Iodoflex which I think has been helpful but nonetheless I think we might be able to do something to speed this up a bit at this point. 02/04/21; right lateral calf which was initially a dog bite. We have been using silver collagen covered in Tubigrip changing 3 times a week. They are moistening this with saline but apparently the collagen is staying moist. Dimensions down by half a centimeter 02/11/2021 upon evaluation today patient's wound on the leg actually appears to be doing quite well. I am actually very pleased with where things stand today. I do not see any evidence of active infection locally nor systemically which is great news. Overall I feel like that we are making excellent progress and I am extremely pleased with where we stand at this point. He does have some irritation from the tape we can work on keeping tape off of the skin going forward. 02/18/2021 upon evaluation today patient appears to be doing well with regard to his wound. He has been tolerating the dressing changes without complication. Fortunately I do not see any signs of active infection locally nor systemically at this time which is great news. No fevers, chills, nausea, vomiting, or diarrhea. Objective Constitutional Well-nourished and well-hydrated in no acute distress. Vitals Time Taken: 9:50 AM, Height: 67 in, Weight: 150 lbs,  BMI: 23.5, Temperature: 97.8/ F, Pulse: 53 bpm, Respiratory Rate: 20 breaths/min, Blood Pressure: 102/57 mmHg. Gow, Sten ([id number]) Respiratory normal breathing  without difficulty. Psychiatric this patient is able to make decisions and demonstrates good insight into disease process. Alert and Oriented x 3. pleasant and cooperative. General Notes: Upon inspection patient's wound bed showed signs of good granulation and epithelization at this point. Fortunately I do not see any evidence of active infection locally nor systemically which is great news and overall I am very pleased I think that he has a lot of new granulation growth and overall we are very close to getting this completely covered with granulation which will be the first step in getting the skin completely covering the area as well even so were already significantly smaller compared to where he was. Integumentary (Hair, Skin) Wound #1 status is Open. Original cause of wound was Bite. The date acquired was: 11/16/2020. The wound has been in treatment 8 weeks. The wound is located on the Right,Lateral Lower Leg. The wound measures 0.7cm length x 0.4cm width x 0.1cm depth; 0.22cm^2 area and 0.022cm^3 volume. There is Fat Layer (Subcutaneous Tissue) exposed. There is no tunneling or undermining noted. There is a medium amount of serosanguineous drainage noted. There is large (67-100%) red granulation within the wound bed. There is a small (1-33%) amount of necrotic tissue within the wound bed including Adherent Slough. Assessment Active Problems ICD-10 Bitten by dog, initial encounter Non-pressure chronic ulcer of other part of right lower leg with fat layer exposed Type 2 diabetes mellitus with other skin ulcer Chronic combined systolic (congestive) and diastolic (congestive) heart failure Paroxysmal atrial fibrillation Essential (primary) hypertension Plan Follow-up Appointments: Return Appointment in 1 week. Bathing/ Shower/ Hygiene: May shower; gently cleanse wound with antibacterial soap, rinse and pat dry prior to dressing wounds - may shower on days dressing changed, remove  dressing, shower, reapply dressing Edema Control - Lymphedema / Segmental Compressive Device / Other: Tubigrip single layer applied. - side c Elevate, Exercise Daily and Avoid Standing for Long Periods of Time. Elevate legs to the level of the heart and pump ankles as often as possible Elevate leg(s) parallel to the floor when sitting. Other: - mepitel to shin WOUND #1: - Lower Leg Wound Laterality: Right, Lateral Cleanser: Byram Ancillary Kit - 15 Day Supply (Generic) 3 x Per Week/30 Days Discharge Instructions: Use supplies as instructed; Kit contains: (15) Saline Bullets; (15) 3x3 Gauze; 15 pr Gloves Cleanser: Soap and Water 3 x Per Week/30 Days Discharge Instructions: Gently cleanse wound with antibacterial soap, rinse and pat dry prior to dressing wounds Primary Dressing: Prisma 4.34 (in) 3 x Per Week/30 Days Discharge Instructions: Moisten w/normal saline or sterile water; Cover wound as directed. Do not remove from wound bed. Secondary Dressing: Gauze 3 x Per Week/30 Days Discharge Instructions: As directed: dry, moistened with saline or moistened with Dakins Solution Secured With: 28M Medipore H Soft Cloth Surgical Tape, 2x2 (in/yd) (Generic) 3 x Per Week/30 Days Discharge Instructions: no tape on skin Compression Wrap: tubi grip 3 x Per Week/30 Days Discharge Instructions: size c 1. Would recommend currently that we going to continue with the wound care measures as before and the patient is in agreement with plan. This includes the use of the silver collagen which I think is doing an awesome job. 2. I am also can recommend that we have the patient continue with the gauze secured with tape followed by Tubigrip to cover. We will see patient back  for reevaluation in 1 week here in the clinic. If anything worsens or changes patient will contact our office for additional recommendations. ULICE, FOLLETT ([id number]) Electronic Signature(s) Signed: 02/18/2021 10:22:35 AM By: Billy Keeler PA-C Entered By: Billy Franco on 02/18/2021 10:22:35 RALPHAEL, SOUTHGATE ([id number]) -------------------------------------------------------------------------------- SuperBill Details Patient Name: Billy Franco Date of Service: 02/18/2021 Medical Record Number: [id number] Patient Account Number: [id number] Date of Birth/Sex: [birth year] (80 y.o. M) Treating RN: Billy Franco Primary Care Provider: Steele Franco Other Clinician: Referring Provider: Steele Franco Treating Provider/Extender: Billy Franco in Treatment: 8 Diagnosis Coding ICD-10 Codes Code Description [phone] Bitten by dog, initial encounter [phone] Non-pressure chronic ulcer of other part of right lower leg with fat layer exposed E11.622 Type 2 diabetes mellitus with other skin ulcer I50.42 Chronic combined systolic (congestive) and diastolic (congestive) heart failure I48.0 Paroxysmal atrial fibrillation I10 Essential (primary) hypertension Facility Procedures CPT4 Code: [id number] Description: 99213 - WOUND CARE VISIT-LEV 3 EST PT Modifier: Quantity: 1 Physician Procedures CPT4 Code: [id number] Description: 90211 - WC PHYS LEVEL 3 - EST PT Modifier: Quantity: 1 CPT4 Code: Description: ICD-10 Diagnosis Description W54.0XXA Bitten by dog, initial encounter [phone] Non-pressure chronic ulcer of other part of right lower leg with fat la E11.622 Type 2 diabetes mellitus with other skin ulcer I50.42 Chronic combined systolic  (congestive) and diastolic (congestive) heart Modifier: yer exposed failure Quantity: Electronic Signature(s) Signed: 02/18/2021 10:24:52 AM By: Billy Keeler PA-C Entered By: Billy Franco on 02/18/2021 10:24:51

## 2021-02-18 NOTE — Progress Notes (Signed)
DEUNDRE, Franco ([id number]) Visit Report for 02/18/2021 Arrival Information Details Patient Name: Billy Franco, Billy Franco Date of Service: 02/18/2021 10:15 AM Medical Record Number: [id number] Patient Account Number: [id number] Date of Birth/Sex: [birth year] (81 y.o. M) Treating RN: Carlene Coria Primary Care Reilyn Nelson: Steele Sizer Other Clinician: Referring Aviv Lengacher: Steele Sizer Treating Aldora Perman/Extender: Skipper Cliche in Treatment: 8 Visit Information History Since Last Visit All ordered tests and consults were completed: No Patient Arrived: Ambulatory Added or deleted any medications: No Arrival Time: 09:45 Any new allergies or adverse reactions: No Accompanied By: wife Had a fall or experienced change in No Transfer Assistance: None activities of daily living that may affect Patient Identification Verified: Yes risk of falls: Secondary Verification Process Completed: Yes Signs or symptoms of abuse/neglect since last visito No Patient Requires Transmission-Based No Hospitalized since last visit: No Precautions: Implantable device outside of the clinic excluding No Patient Has Alerts: Yes cellular tissue based products placed in the center Patient Alerts: Patient on Blood since last visit: Thinner Has Dressing in Place as Prescribed: Yes Pain Present Now: No Electronic Signature(s) Signed: 02/18/2021 3:58:09 PM By: Carlene Coria RN Entered By: Carlene Coria on 02/18/2021 09:50:11 Billy Franco ([id number]) -------------------------------------------------------------------------------- Clinic Level of Care Assessment Details Patient Name: Billy Franco Date of Service: 02/18/2021 10:15 AM Medical Record Number: [id number] Patient Account Number: [id number] Date of Birth/Sex: 09/27/1940 (80 y.o. M) Treating RN: Carlene Coria Primary Care Oak Dorey: Steele Sizer Other Clinician: Referring Jessicalynn Deshong: Steele Sizer Treating Ethan Kasperski/Extender: Skipper Cliche in  Treatment: 8 Clinic Level of Care Assessment Items TOOL 4 Quantity Score X - Use when only an EandM is performed on FOLLOW-UP visit 1 0 ASSESSMENTS - Nursing Assessment / Reassessment X - Reassessment of Co-morbidities (includes updates in patient status) 1 10 X- 1 5 Reassessment of Adherence to Treatment Plan ASSESSMENTS - Wound and Skin Assessment / Reassessment X - Simple Wound Assessment / Reassessment - one wound 1 5 []  - 0 Complex Wound Assessment / Reassessment - multiple wounds []  - 0 Dermatologic / Skin Assessment (not related to wound area) ASSESSMENTS - Focused Assessment []  - Circumferential Edema Measurements - multi extremities 0 []  - 0 Nutritional Assessment / Counseling / Intervention []  - 0 Lower Extremity Assessment (monofilament, tuning fork, pulses) []  - 0 Peripheral Arterial Disease Assessment (using hand held doppler) ASSESSMENTS - Ostomy and/or Continence Assessment and Care []  - Incontinence Assessment and Management 0 []  - 0 Ostomy Care Assessment and Management (repouching, etc.) PROCESS - Coordination of Care X - Simple Patient / Family Education for ongoing care 1 15 []  - 0 Complex (extensive) Patient / Family Education for ongoing care []  - 0 Staff obtains Programmer, systems, Records, Test Results / Process Orders []  - 0 Staff telephones HHA, Nursing Homes / Clarify orders / etc []  - 0 Routine Transfer to another Facility (non-emergent condition) []  - 0 Routine Hospital Admission (non-emergent condition) []  - 0 New Admissions / Biomedical engineer / Ordering NPWT, Apligraf, etc. []  - 0 Emergency Hospital Admission (emergent condition) X- 1 10 Simple Discharge Coordination []  - 0 Complex (extensive) Discharge Coordination PROCESS - Special Needs []  - Pediatric / Minor Patient Management 0 []  - 0 Isolation Patient Management []  - 0 Hearing / Language / Visual special needs []  - 0 Assessment of Community assistance (transportation, D/C  planning, etc.) []  - 0 Additional assistance / Altered mentation []  - 0 Support Surface(s) Assessment (bed, cushion, seat, etc.) INTERVENTIONS - Wound Cleansing / Measurement Brame, Teondre ([id number]) X- 1 5 Simple Wound Cleansing -  one wound []  - 0 Complex Wound Cleansing - multiple wounds X- 1 5 Wound Imaging (photographs - any number of wounds) []  - 0 Wound Tracing (instead of photographs) X- 1 5 Simple Wound Measurement - one wound []  - 0 Complex Wound Measurement - multiple wounds INTERVENTIONS - Wound Dressings X - Small Wound Dressing one or multiple wounds 1 10 []  - 0 Medium Wound Dressing one or multiple wounds []  - 0 Large Wound Dressing one or multiple wounds X- 1 5 Application of Medications - topical []  - 0 Application of Medications - injection INTERVENTIONS - Miscellaneous []  - External ear exam 0 []  - 0 Specimen Collection (cultures, biopsies, blood, body fluids, etc.) []  - 0 Specimen(s) / Culture(s) sent or taken to Lab for analysis []  - 0 Patient Transfer (multiple staff / Civil Service fast streamer / Similar devices) []  - 0 Simple Staple / Suture removal (25 or less) []  - 0 Complex Staple / Suture removal (26 or more) []  - 0 Hypo / Hyperglycemic Management (close monitor of Blood Glucose) []  - 0 Ankle / Brachial Index (ABI) - do not check if billed separately X- 1 5 Vital Signs Has the patient been seen at the hospital within the last three years: Yes Total Score: 80 Level Of Care: New/Established - Level 3 Electronic Signature(s) Signed: 02/18/2021 3:58:09 PM By: Carlene Coria RN Entered By: Carlene Coria on 02/18/2021 10:18:07 Billy Franco ([id number]) -------------------------------------------------------------------------------- Encounter Discharge Information Details Patient Name: Billy Franco Date of Service: 02/18/2021 10:15 AM Medical Record Number: [id number] Patient Account Number: [id number] Date of Birth/Sex: 09-26-1940 (80 y.o.  M) Treating RN: Carlene Coria Primary Care Elinor Kleine: Steele Sizer Other Clinician: Referring Klarissa Mcilvain: Steele Sizer Treating Sharad Vaneaton/Extender: Skipper Cliche in Treatment: 8 Encounter Discharge Information Items Discharge Condition: Stable Ambulatory Status: Ambulatory Discharge Destination: Home Transportation: Private Auto Accompanied By: self Schedule Follow-up Appointment: Yes Clinical Summary of Care: Patient Declined Electronic Signature(s) Signed: 02/18/2021 3:58:09 PM By: Carlene Coria RN Entered By: Carlene Coria on 02/18/2021 10:19:01 Billy Franco ([id number]) -------------------------------------------------------------------------------- Lower Extremity Assessment Details Patient Name: Billy Franco Date of Service: 02/18/2021 10:15 AM Medical Record Number: [id number] Patient Account Number: [id number] Date of Birth/Sex: [birth year] (80 y.o. M) Treating RN: Carlene Coria Primary Care Nazirah Tri: Steele Sizer Other Clinician: Referring Jahshua Bonito: Steele Sizer Treating Jadea Shiffer/Extender: Skipper Cliche in Treatment: 8 Edema Assessment Assessed: [Left: No] [Right: No] [Left: Edema] [Right: :] Calf Left: Right: Point of Measurement: 36 cm From Medial Instep 32 cm Ankle Left: Right: Point of Measurement: 10 cm From Medial Instep 20 cm Vascular Assessment Pulses: Dorsalis Pedis Palpable: [Right:Yes] Electronic Signature(s) Signed: 02/18/2021 3:58:09 PM By: Carlene Coria RN Entered By: Carlene Coria on 02/18/2021 09:55:49 Billy Franco ([id number]) -------------------------------------------------------------------------------- Multi Wound Chart Details Patient Name: Billy Franco Date of Service: 02/18/2021 10:15 AM Medical Record Number: [id number] Patient Account Number: [id number] Date of Birth/Sex: [birth year] (80 y.o. M) Treating RN: Carlene Coria Primary Care Hayla Hinger: Steele Sizer Other Clinician: Referring Tearra Ouk: Steele Sizer Treating Viki Carrera/Extender: Skipper Cliche in Treatment: 8 Vital Signs Height(in): 27 Pulse(bpm): 83 Weight(lbs): 150 Blood Pressure(mmHg): 102/57 Body Mass Index(BMI): 23.5 Temperature(F): 97.8/ Respiratory Rate(breaths/min): 20 Photos: [N/A:N/A] Wound Location: Right, Lateral Lower Leg N/A N/A Wounding Event: Bite N/A N/A Primary Etiology: Diabetic Wound/Ulcer of the Lower N/A N/A Extremity Comorbid History: Congestive Heart Failure, N/A N/A Hypertension, Type II Diabetes Date Acquired: 11/16/2020 N/A N/A Weeks of Treatment: 8 N/A N/A Wound Status: Open N/A N/A Wound Recurrence: No N/A N/A Measurements L x W x D (  cm) 0.7x0.4x0.1 N/A N/A Area (cm) : 0.22 N/A N/A Volume (cm) : 0.022 N/A N/A % Reduction in Area: 88.30% N/A N/A % Reduction in Volume: 97.70% N/A N/A Classification: Grade 2 N/A N/A Exudate Amount: Medium N/A N/A Exudate Type: Serosanguineous N/A N/A Exudate Color: red, brown N/A N/A Granulation Amount: Large (67-100%) N/A N/A Granulation Quality: Red N/A N/A Necrotic Amount: Small (1-33%) N/A N/A Exposed Structures: Fat Layer (Subcutaneous Tissue): N/A N/A Yes Fascia: No Tendon: No Muscle: No Joint: No Bone: No Epithelialization: None N/A N/A Treatment Notes Electronic Signature(s) Signed: 02/18/2021 3:58:09 PM By: Carlene Coria RN Entered By: Carlene Coria on 02/18/2021 10:16:40 Billy Franco ([id number]) -------------------------------------------------------------------------------- Multi-Disciplinary Care Plan Details Patient Name: Billy Franco Date of Service: 02/18/2021 10:15 AM Medical Record Number: [id number] Patient Account Number: [id number] Date of Birth/Sex: 1940-05-06 (80 y.o. M) Treating RN: Carlene Coria Primary Care Casie Sturgeon: Steele Sizer Other Clinician: Referring Rexton Greulich: Steele Sizer Treating Nicholle Falzon/Extender: Skipper Cliche in Treatment: 8 Active Inactive Wound/Skin Impairment Nursing  Diagnoses: Knowledge deficit related to ulceration/compromised skin integrity Goals: Patient/caregiver will verbalize understanding of skin care regimen Date Initiated: 12/24/2020 Target Resolution Date: 01/24/2021 Goal Status: Active Ulcer/skin breakdown will have a volume reduction of 30% by week 4 Date Initiated: 12/24/2020 Target Resolution Date: 02/24/2021 Goal Status: Active Ulcer/skin breakdown will have a volume reduction of 50% by week 8 Date Initiated: 12/24/2020 Target Resolution Date: 03/24/2021 Goal Status: Active Ulcer/skin breakdown will have a volume reduction of 80% by week 12 Date Initiated: 12/24/2020 Target Resolution Date: 04/24/2021 Goal Status: Active Ulcer/skin breakdown will heal within 14 weeks Date Initiated: 12/24/2020 Target Resolution Date: 05/24/2021 Goal Status: Active Interventions: Assess patient/caregiver ability to obtain necessary supplies Assess patient/caregiver ability to perform ulcer/skin care regimen upon admission and as needed Assess ulceration(s) every visit Notes: Electronic Signature(s) Signed: 02/18/2021 3:58:09 PM By: Carlene Coria RN Entered By: Carlene Coria on 02/18/2021 10:16:22 Billy Franco ([id number]) -------------------------------------------------------------------------------- Pain Assessment Details Patient Name: Billy Franco Date of Service: 02/18/2021 10:15 AM Medical Record Number: [id number] Patient Account Number: [id number] Date of Birth/Sex: Apr 07, 1940 (80 y.o. M) Treating RN: Carlene Coria Primary Care Hunter Bachar: Steele Sizer Other Clinician: Referring Lachina Salsberry: Steele Sizer Treating Diavion Labrador/Extender: Skipper Cliche in Treatment: 8 Active Problems Location of Pain Severity and Description of Pain Patient Has Paino No Site Locations Pain Management and Medication Current Pain Management: Electronic Signature(s) Signed: 02/18/2021 3:58:09 PM By: Carlene Coria RN Entered By: Carlene Coria on 02/18/2021  09:51:16 Billy Franco ([id number]) -------------------------------------------------------------------------------- Patient/Caregiver Education Details Patient Name: Billy Franco Date of Service: 02/18/2021 10:15 AM Medical Record Number: [id number] Patient Account Number: [id number] Date of Birth/Gender: April 01, 1940 (80 y.o. M) Treating RN: Carlene Coria Primary Care Physician: Steele Sizer Other Clinician: Referring Physician: Steele Sizer Treating Physician/Extender: Skipper Cliche in Treatment: 8 Education Assessment Education Provided To: Patient Education Topics Provided Wound/Skin Impairment: Methods: Explain/Verbal Responses: State content correctly Electronic Signature(s) Signed: 02/18/2021 3:58:09 PM By: Carlene Coria RN Entered By: Carlene Coria on 02/18/2021 10:18:24 Billy Franco ([id number]) -------------------------------------------------------------------------------- Wound Assessment Details Patient Name: Billy Franco Date of Service: 02/18/2021 10:15 AM Medical Record Number: [id number] Patient Account Number: [id number] Date of Birth/Sex: 1940/12/28 (80 y.o. M) Treating RN: Carlene Coria Primary Care Dhriti Fales: Steele Sizer Other Clinician: Referring Hermes Wafer: Steele Sizer Treating Breezy Hertenstein/Extender: Skipper Cliche in Treatment: 8 Wound Status Wound Number: 1 Primary Etiology: Diabetic Wound/Ulcer of the Lower Extremity Wound Location: Right, Lateral Lower Leg Wound Status: Open Wounding Event: Bite Comorbid Congestive Heart Failure, Hypertension, Type II History: Diabetes Date  Acquired: 11/16/2020 Weeks Of Treatment: 8 Clustered Wound: No Photos Wound Measurements Length: (cm) 0.7 Width: (cm) 0.4 Depth: (cm) 0.1 Area: (cm) 0.22 Volume: (cm) 0.022 % Reduction in Area: 88.3% % Reduction in Volume: 97.7% Epithelialization: None Tunneling: No Undermining: No Wound Description Classification: Grade 2 Exudate Amount:  Medium Exudate Type: Serosanguineous Exudate Color: red, brown Foul Odor After Cleansing: No Slough/Fibrino Yes Wound Bed Granulation Amount: Large (67-100%) Exposed Structure Granulation Quality: Red Fascia Exposed: No Necrotic Amount: Small (1-33%) Fat Layer (Subcutaneous Tissue) Exposed: Yes Necrotic Quality: Adherent Slough Tendon Exposed: No Muscle Exposed: No Joint Exposed: No Bone Exposed: No Treatment Notes Wound #1 (Lower Leg) Wound Laterality: Right, Lateral Cleanser [place] Ancillary Kit - 15 Day Supply Discharge Instruction: Use supplies as instructed; Kit contains: (15) Saline Bullets; (15) 3x3 Gauze; 15 pr Gloves Soap and Water Discharge Instruction: Gently cleanse wound with antibacterial soap, rinse and pat dry prior to dressing wounds Rosemeyer, Mac ([id number]) Peri-Wound Care Topical Primary Dressing Prisma 4.34 (in) Discharge Instruction: Moisten w/normal saline or sterile water; Cover wound as directed. Do not remove from wound bed. Secondary Dressing Gauze Discharge Instruction: As directed: dry, moistened with saline or moistened with Dakins Solution Secured With 32M Medipore H Soft Cloth Surgical Tape, 2x2 (in/yd) Discharge Instruction: no tape on skin Compression Wrap tubi grip Discharge Instruction: size c Compression Stockings Add-Ons Electronic Signature(s) Signed: 02/18/2021 3:58:09 PM By: Carlene Coria RN Entered By: Carlene Coria on 02/18/2021 09:55:26 Billy Franco ([id number]) -------------------------------------------------------------------------------- Vitals Details Patient Name: Billy Franco Date of Service: 02/18/2021 10:15 AM Medical Record Number: [id number] Patient Account Number: [id number] Date of Birth/Sex: 1940-05-06 (80 y.o. M) Treating RN: Carlene Coria Primary Care Virginia Curl: Steele Sizer Other Clinician: Referring Syna Gad: Steele Sizer Treating Annetta Deiss/Extender: Skipper Cliche in Treatment: 8 Vital  Signs Time Taken: 09:50 Temperature (F): 97.8/ Height (in): 67 Pulse (bpm): 53 Weight (lbs): 150 Respiratory Rate (breaths/min): 20 Body Mass Index (BMI): 23.5 Blood Pressure (mmHg): 102/57 Reference Range: 80 - 120 mg / dl Electronic Signature(s) Signed: 02/18/2021 3:58:09 PM By: Carlene Coria RN Entered By: Carlene Coria on 02/18/2021 09:51:09

## 2021-02-25 DIAGNOSIS — Z23 Encounter for immunization: Secondary | ICD-10-CM | POA: Diagnosis not present

## 2021-02-26 ENCOUNTER — Other Ambulatory Visit: Payer: Self-pay

## 2021-02-26 ENCOUNTER — Encounter: Payer: Medicare Other | Attending: Physician Assistant | Admitting: Physician Assistant

## 2021-02-26 DIAGNOSIS — E119 Type 2 diabetes mellitus without complications: Secondary | ICD-10-CM | POA: Diagnosis not present

## 2021-02-26 DIAGNOSIS — E11622 Type 2 diabetes mellitus with other skin ulcer: Secondary | ICD-10-CM | POA: Diagnosis not present

## 2021-02-26 DIAGNOSIS — I5042 Chronic combined systolic (congestive) and diastolic (congestive) heart failure: Secondary | ICD-10-CM | POA: Insufficient documentation

## 2021-02-26 DIAGNOSIS — I110 Hypertensive heart disease with heart failure: Secondary | ICD-10-CM | POA: Diagnosis not present

## 2021-02-26 DIAGNOSIS — L97812 Non-pressure chronic ulcer of other part of right lower leg with fat layer exposed: Secondary | ICD-10-CM | POA: Diagnosis not present

## 2021-02-26 DIAGNOSIS — I480 Paroxysmal atrial fibrillation: Secondary | ICD-10-CM | POA: Diagnosis not present

## 2021-02-26 DIAGNOSIS — S81851A Open bite, right lower leg, initial encounter: Secondary | ICD-10-CM | POA: Insufficient documentation

## 2021-02-26 DIAGNOSIS — W540XXA Bitten by dog, initial encounter: Secondary | ICD-10-CM | POA: Diagnosis not present

## 2021-02-26 DIAGNOSIS — I10 Essential (primary) hypertension: Secondary | ICD-10-CM | POA: Diagnosis not present

## 2021-02-26 NOTE — Progress Notes (Addendum)
MARCQUES, WRIGHTSMAN ([id number]) Visit Report for 02/26/2021 Arrival Information Details Patient Name: Billy Franco, Billy Franco Date of Service: 02/26/2021 1:15 PM Medical Record Number: [id number] Patient Account Number: [id number] Date of Birth/Sex: 12-09-1940 (81 y.o. M) Treating RN: Carlene Coria Primary Care Richrd Kuzniar: Steele Sizer Other Clinician: Referring Ophia Shamoon: Steele Sizer Treating Kayra Crowell/Extender: Skipper Cliche in Treatment: 9 Visit Information History Since Last Visit All ordered tests and consults were completed: No Patient Arrived: Ambulatory Added or deleted any medications: No Arrival Time: 13:09 Any new allergies or adverse reactions: No Accompanied By: wife Had a fall or experienced change in No Transfer Assistance: None activities of daily living that may affect Patient Identification Verified: Yes risk of falls: Secondary Verification Process Completed: Yes Signs or symptoms of abuse/neglect since last visito No Patient Requires Transmission-Based No Hospitalized since last visit: No Precautions: Implantable device outside of the clinic excluding No Patient Has Alerts: Yes cellular tissue based products placed in the center Patient Alerts: Patient on Blood since last visit: Thinner Has Dressing in Place as Prescribed: Yes Has Compression in Place as Prescribed: Yes Pain Present Now: No Electronic Signature(s) Signed: 02/28/2021 8:26:07 AM By: Carlene Coria RN Entered By: Carlene Coria on 02/26/2021 13:16:49 Billy Franco ([id number]) -------------------------------------------------------------------------------- Clinic Level of Care Assessment Details Patient Name: Billy Franco Date of Service: 02/26/2021 1:15 PM Medical Record Number: [id number] Patient Account Number: [id number] Date of Birth/Sex: 10/20/1940 (81 y.o. M) Treating RN: Carlene Coria Primary Care Reyah Streeter: Steele Sizer Other Clinician: Referring Kathya Wilz: Steele Sizer Treating  Lomax Poehler/Extender: Skipper Cliche in Treatment: 9 Clinic Level of Care Assessment Items TOOL 4 Quantity Score X - Use when only an EandM is performed on FOLLOW-UP visit 1 0 ASSESSMENTS - Nursing Assessment / Reassessment X - Reassessment of Co-morbidities (includes updates in patient status) 1 10 X- 1 5 Reassessment of Adherence to Treatment Plan ASSESSMENTS - Wound and Skin Assessment / Reassessment X - Simple Wound Assessment / Reassessment - one wound 1 5 _0  - 0 Complex Wound Assessment / Reassessment - multiple wounds _1  - 0 Dermatologic / Skin Assessment (not related to wound area) ASSESSMENTS - Focused Assessment _2  - Circumferential Edema Measurements - multi extremities 0 _3  - 0 Nutritional Assessment / Counseling / Intervention _4  - 0 Lower Extremity Assessment (monofilament, tuning fork, pulses) _5  - 0 Peripheral Arterial Disease Assessment (using hand held doppler) ASSESSMENTS - Ostomy and/or Continence Assessment and Care _6  - Incontinence Assessment and Management 0 _7  - 0 Ostomy Care Assessment and Management (repouching, etc.) PROCESS - Coordination of Care X - Simple Patient / Family Education for ongoing care 1 15 _8  - 0 Complex (extensive) Patient / Family Education for ongoing care _9  - 0 Staff obtains Programmer, systems, Records, Test Results / Process Orders _10  - 0 Staff telephones HHA, Nursing Homes / Clarify orders / etc _11  - 0 Routine Transfer to another Facility (non-emergent condition) _12  - 0 Routine Hospital Admission (non-emergent condition) _13  - 0 New Admissions / Biomedical engineer / Ordering NPWT, Apligraf, etc. _14  - 0 Emergency Hospital Admission (emergent condition) X- 1 10 Simple Discharge Coordination _15  - 0 Complex (extensive) Discharge Coordination PROCESS - Special Needs _16  - Pediatric / Minor Patient Management 0 _17  - 0 Isolation Patient Management _18  - 0 Hearing / Language / Visual special needs _19  - 0 Assessment of  Community assistance (transportation, D/C planning, etc.) _20  - 0 Additional assistance / Altered mentation _21  - 0 Support Surface(s) Assessment (bed, cushion, seat, etc.) INTERVENTIONS - Wound Cleansing / Measurement Billy Franco, Billy Franco (  [id number]) X- 1 5 Simple Wound Cleansing - one wound _0  - 0 Complex Wound Cleansing - multiple wounds X- 1 5 Wound Imaging (photographs - any number of wounds) _1  - 0 Wound Tracing (instead of photographs) X- 1 5 Simple Wound Measurement - one wound _2  - 0 Complex Wound Measurement - multiple wounds INTERVENTIONS - Wound Dressings X - Small Wound Dressing one or multiple wounds 1 10 _3  - 0 Medium Wound Dressing one or multiple wounds _4  - 0 Large Wound Dressing one or multiple wounds <[id number]>_7<\/[id number]>_6  - 0 Application of Medications - topical X- 1 10 Application of Medications - injection INTERVENTIONS - Miscellaneous _6  - External ear exam 0 _7  - 0 Specimen Collection (cultures, biopsies, blood, body fluids, etc.) _8  - 0 Specimen(s) / Culture(s) sent or taken to Lab for analysis _9  - 0 Patient Transfer (multiple staff / Civil Service fast streamer / Similar devices) _10  - 0 Simple Staple / Suture removal (25 or less) _11  - 0 Complex Staple / Suture removal (26 or more) _12  - 0 Hypo / Hyperglycemic Management (close monitor of Blood Glucose) _13  - 0 Ankle / Brachial Index (ABI) - do not check if billed separately X- 1 5 Vital Signs Has the patient been seen at the hospital within the last three years: Yes Total Score: 85 Level Of Care: New/Established - Level 3 Electronic Signature(s) Signed: 02/28/2021 8:26:07 AM By: Carlene Coria RN Entered By: Carlene Coria on 02/26/2021 13:48:26 Billy Franco ([id number]) -------------------------------------------------------------------------------- Encounter Discharge Information Details Patient Name: Billy Franco Date of Service: 02/26/2021 1:15 PM Medical Record Number: [id number] Patient Account Number: [id number] Date of  Birth/Sex: Oct 05, 1940 (80 y.o. M) Treating RN: Carlene Coria Primary Care Rachael Ferrie: Steele Sizer Other Clinician: Referring Zohaib Heeney: Steele Sizer Treating Cardale Dorer/Extender: Skipper Cliche in Treatment: 9 Encounter Discharge Information Items Discharge Condition: Stable Ambulatory Status: Ambulatory Discharge Destination: Home Transportation: Private Auto Accompanied By: wife Schedule Follow-up Appointment: Yes Clinical Summary of Care: Patient Declined Electronic Signature(s) Signed: 02/28/2021 8:26:07 AM By: Carlene Coria RN Entered By: Carlene Coria on 02/26/2021 13:49:31 Billy Franco ([id number]) -------------------------------------------------------------------------------- Lower Extremity Assessment Details Patient Name: Billy Franco Date of Service: 02/26/2021 1:15 PM Medical Record Number: [id number] Patient Account Number: [id number] Date of Birth/Sex: 1940/09/26 (80 y.o. M) Treating RN: Carlene Coria Primary Care Neosha Switalski: Steele Sizer Other Clinician: Referring Solace Manwarren: Steele Sizer Treating Tyrene Nader/Extender: Skipper Cliche in Treatment: 9 Edema Assessment Assessed: [Left: No] [Right: No] Edema: [Left: Ye] [Right: s] Calf Left: Right: Point of Measurement: 36 cm From Medial Instep 31 cm Ankle Left: Right: Point of Measurement: 10 cm From Medial Instep 20 cm Vascular Assessment Pulses: Dorsalis Pedis Palpable: [Right:Yes] Electronic Signature(s) Signed: 02/28/2021 8:26:07 AM By: Carlene Coria RN Entered By: Carlene Coria on 02/26/2021 13:23:06 Billy Franco ([id number]) -------------------------------------------------------------------------------- Multi Wound Chart Details Patient Name: Billy Franco Date of Service: 02/26/2021 1:15 PM Medical Record Number: [id number] Patient Account Number: [id number] Date of Birth/Sex: August 30, 1940 (80 y.o. M) Treating RN: Carlene Coria Primary Care Aadyn Buchheit: Steele Sizer Other Clinician: Referring  Junette Bernat: Steele Sizer Treating Clevester Helzer/Extender: Skipper Cliche in Treatment: 9 Vital Signs Height(in): 67 Pulse(bpm): 95 Weight(lbs): 150 Blood Pressure(mmHg): 97/38 Body Mass Index(BMI): 23.5 Temperature(F): 98.4 Respiratory Rate(breaths/min): 18 Photos: [N/A:N/A] Wound Location: Right, Lateral Lower Leg N/A N/A Wounding Event: Bite N/A N/A Primary Etiology: Diabetic Wound/Ulcer of the Lower N/A N/A Extremity Comorbid History: Congestive Heart Failure, N/A N/A Hypertension, Type II Diabetes Date Acquired: 11/16/2020 N/A N/A Weeks of Treatment: 9 N/A N/A Wound Status: Open N/A N/A Wound Recurrence:  No N/A N/A Measurements L x W x D (cm) 0.6x0.4x0.2 N/A N/A Area (cm) : 0.188 N/A N/A Volume (cm) : 0.038 N/A N/A % Reduction in Area: 90.00% N/A N/A % Reduction in Volume: 96.00% N/A N/A Classification: Grade 2 N/A N/A Exudate Amount: Medium N/A N/A Exudate Type: Serosanguineous N/A N/A Exudate Color: red, brown N/A N/A Granulation Amount: Large (67-100%) N/A N/A Granulation Quality: Red N/A N/A Necrotic Amount: Small (1-33%) N/A N/A Exposed Structures: Fat Layer (Subcutaneous Tissue): N/A N/A Yes Fascia: No Tendon: No Muscle: No Joint: No Bone: No Epithelialization: None N/A N/A Treatment Notes Electronic Signature(s) Signed: 02/28/2021 8:26:07 AM By: Carlene Coria RN Entered By: Carlene Coria on 02/26/2021 13:46:49 Billy Franco ([id number]) -------------------------------------------------------------------------------- Multi-Disciplinary Care Plan Details Patient Name: Billy Franco Date of Service: 02/26/2021 1:15 PM Medical Record Number: [id number] Patient Account Number: [id number] Date of Birth/Sex: 1940-11-19 (80 y.o. M) Treating RN: Carlene Coria Primary Care Evalette Montrose: Steele Sizer Other Clinician: Referring Bert Givans: Steele Sizer Treating Dereke Neumann/Extender: Skipper Cliche in Treatment: 9 Active Inactive Wound/Skin Impairment Nursing  Diagnoses: Knowledge deficit related to ulceration/compromised skin integrity Goals: Patient/caregiver will verbalize understanding of skin care regimen Date Initiated: 12/24/2020 Target Resolution Date: 01/24/2021 Goal Status: Active Ulcer/skin breakdown will have a volume reduction of 30% by week 4 Date Initiated: 12/24/2020 Target Resolution Date: 02/24/2021 Goal Status: Active Ulcer/skin breakdown will have a volume reduction of 50% by week 8 Date Initiated: 12/24/2020 Target Resolution Date: 03/24/2021 Goal Status: Active Ulcer/skin breakdown will have a volume reduction of 80% by week 12 Date Initiated: 12/24/2020 Target Resolution Date: 04/24/2021 Goal Status: Active Ulcer/skin breakdown will heal within 14 weeks Date Initiated: 12/24/2020 Target Resolution Date: 05/24/2021 Goal Status: Active Interventions: Assess patient/caregiver ability to obtain necessary supplies Assess patient/caregiver ability to perform ulcer/skin care regimen upon admission and as needed Assess ulceration(s) every visit Notes: Electronic Signature(s) Signed: 02/28/2021 8:26:07 AM By: Carlene Coria RN Entered By: Carlene Coria on 02/26/2021 [place], Henri ([id number]) -------------------------------------------------------------------------------- Pain Assessment Details Patient Name: Billy Franco Date of Service: 02/26/2021 1:15 PM Medical Record Number: [id number] Patient Account Number: [id number] Date of Birth/Sex: 10-15-1940 (80 y.o. M) Treating RN: Carlene Coria Primary Care Forrest Jaroszewski: Steele Sizer Other Clinician: Referring Michaeline Eckersley: Steele Sizer Treating Ralphael Southgate/Extender: Skipper Cliche in Treatment: 9 Active Problems Location of Pain Severity and Description of Pain Patient Has Paino No Site Locations Pain Management and Medication Current Pain Management: Electronic Signature(s) Signed: 02/28/2021 8:26:07 AM By: Carlene Coria RN Entered By: Carlene Coria on 02/26/2021  13:17:34 Billy Franco ([id number]) -------------------------------------------------------------------------------- Patient/Caregiver Education Details Patient Name: Billy Franco Date of Service: 02/26/2021 1:15 PM Medical Record Number: [id number] Patient Account Number: [id number] Date of Birth/Gender: [birth year] (80 y.o. M) Treating RN: Carlene Coria Primary Care Physician: Steele Sizer Other Clinician: Referring Physician: Steele Sizer Treating Physician/Extender: Skipper Cliche in Treatment: 9 Education Assessment Education Provided To: Patient Education Topics Provided Wound/Skin Impairment: Methods: Explain/Verbal Responses: State content correctly Electronic Signature(s) Signed: 02/28/2021 8:26:07 AM By: Carlene Coria RN Entered By: Carlene Coria on 02/26/2021 13:48:45 Billy Franco ([id number]) -------------------------------------------------------------------------------- Wound Assessment Details Patient Name: Billy Franco Date of Service: 02/26/2021 1:15 PM Medical Record Number: [id number] Patient Account Number: [id number] Date of Birth/Sex: 29-Apr-1940 (80 y.o. M) Treating RN: Carlene Coria Primary Care Rihanna Marseille: Steele Sizer Other Clinician: Referring Britne Borelli: Steele Sizer Treating Ranada Vigorito/Extender: Skipper Cliche in Treatment: 9 Wound Status Wound Number: 1 Primary Etiology: Diabetic Wound/Ulcer of the Lower Extremity Wound Location: Right, Lateral Lower Leg Wound Status: Open Wounding Event: Bite Comorbid  Congestive Heart Failure, Hypertension, Type II History: Diabetes Date Acquired: 11/16/2020 Weeks Of Treatment: 9 Clustered Wound: No Photos Wound Measurements Length: (cm) 0.6 Width: (cm) 0.4 Depth: (cm) 0.2 Area: (cm) 0.188 Volume: (cm) 0.038 % Reduction in Area: 90% % Reduction in Volume: 96% Epithelialization: None Tunneling: No Undermining: No Wound Description Classification: Grade 2 Exudate Amount: Medium Exudate  Type: Serosanguineous Exudate Color: red, brown Foul Odor After Cleansing: No Slough/Fibrino Yes Wound Bed Granulation Amount: Large (67-100%) Exposed Structure Granulation Quality: Red Fascia Exposed: No Necrotic Amount: Small (1-33%) Fat Layer (Subcutaneous Tissue) Exposed: Yes Necrotic Quality: Adherent Slough Tendon Exposed: No Muscle Exposed: No Joint Exposed: No Bone Exposed: No Treatment Notes Wound #1 (Lower Leg) Wound Laterality: Right, Lateral Cleanser [place] Ancillary Kit - 15 Day Supply Discharge Instruction: Use supplies as instructed; Kit contains: (15) Saline Bullets; (15) 3x3 Gauze; 15 pr Gloves Soap and Water Discharge Instruction: Gently cleanse wound with antibacterial soap, rinse and pat dry prior to dressing wounds Billy Franco, Billy Franco ([id number]) Peri-Wound Care Topical Primary Dressing Prisma 4.34 (in) Discharge Instruction: Moisten w/normal saline or sterile water; Cover wound as directed. Do not remove from wound bed. Secondary Dressing Gauze Discharge Instruction: As directed: dry, moistened with saline or moistened with Dakins Solution Secured With 60M Medipore H Soft Cloth Surgical Tape, 2x2 (in/yd) Discharge Instruction: no tape on skin Compression Wrap tubi grip Discharge Instruction: size c Compression Stockings Add-Ons Electronic Signature(s) Signed: 02/28/2021 8:26:07 AM By: Carlene Coria RN Entered By: Carlene Coria on 02/26/2021 13:22:23 Billy Franco ([id number]) -------------------------------------------------------------------------------- Vitals Details Patient Name: Billy Franco Date of Service: 02/26/2021 1:15 PM Medical Record Number: [id number] Patient Account Number: [id number] Date of Birth/Sex: 1940-02-15 (80 y.o. M) Treating RN: Carlene Coria Primary Care Bronislaus Verdell: Steele Sizer Other Clinician: Referring Camey Edell: Steele Sizer Treating Kalik Hoare/Extender: Skipper Cliche in Treatment: 9 Vital Signs Time Taken:  13:16 Temperature (F): 98.4 Height (in): 67 Pulse (bpm): 56 Weight (lbs): 150 Respiratory Rate (breaths/min): 18 Body Mass Index (BMI): 23.5 Blood Pressure (mmHg): 97/38 Reference Range: 80 - 120 mg / dl Electronic Signature(s) Signed: 02/28/2021 8:26:07 AM By: Carlene Coria RN Entered By: Carlene Coria on 02/26/2021 13:17:24

## 2021-02-26 NOTE — Progress Notes (Addendum)
TEGH, FRANEK ([id number]) Visit Report for 02/26/2021 Chief Complaint Document Details Patient Name: Billy Franco, Billy Franco Date of Service: 02/26/2021 1:15 PM Medical Record Number: [id number] Patient Account Number: [id number] Date of Birth/Sex: 10-Feb-1940 (81 y.o. M) Treating RN: Carlene Coria Primary Care Provider: Steele Sizer Other Clinician: Referring Provider: Steele Sizer Treating Provider/Extender: Skipper Cliche in Treatment: 9 Information Obtained from: Patient Chief Complaint Right Leg Dog Bite Electronic Signature(s) Signed: 02/26/2021 1:05:09 PM By: Worthy Keeler PA-C Entered By: Worthy Keeler on 02/26/2021 13:05:09 Billy Franco ([id number]) -------------------------------------------------------------------------------- HPI Details Patient Name: Billy Franco Date of Service: 02/26/2021 1:15 PM Medical Record Number: [id number] Patient Account Number: [id number] Date of Birth/Sex: 23-Feb-1940 (80 y.o. M) Treating RN: Carlene Coria Primary Care Provider: Steele Sizer Other Clinician: Referring Provider: Steele Sizer Treating Provider/Extender: Skipper Cliche in Treatment: 9 History of Present Illness HPI Description: 12/24/2020 upon evaluation today patient appears to be doing somewhat poorly in regard to the wound that actually occurred around November 13 2020. This was actually a dog bite he is unsure of what type of dog it was it was a small wound that wound was not a familiar dog to him. Subsequently he ended up being seen by his primary care provider in October 28 by his primary care provider. Subsequently they originally placed him on 2 rounds of amoxicillin he is also been on doxycycline. Unfortunately this just does not seem to be getting better despite the treatment up to this point they have been initially cleaning with alcohol although that has been discontinued when he went to the ER on November 14. They actually recommended cleaning with just soap  and water. There is really not been any specific dressings that were recommended at this time. Subsequently the patient does have an ABI of 1.3 here in the clinic today. This is on the right. Subsequently also has a recent hemoglobin A1c from when he was at the ER of 6.3. He really is not having any significant pain which is good news he does have some swelling which is going to need to be addressed as well. This is minimal however I do not think he has to have a compression wrap I think Tubigrip would probably be appropriate. Patient does have a history of diabetes mellitus type 2, congestive heart failure, atrial fibrillation, and hypertension. 01/01/2021 upon evaluation today patient appears to be doing pretty well in regard to his wound. Fortunately I do not see any signs of infection and in 1 week's time I feel like we have made some good progress here. No fevers, chills, nausea, vomiting, or diarrhea. 01/08/2021 upon evaluation today patient's wound at this point is showing signs of doing really about the same. I am not seeing as much improvement as I would like to see. I think at this point that I would recommend we see about actually using a different medication I am thinking Iodosorb could be beneficial for him. He is in agreement with given something else to try here. 12/27; use Iodoflex starting last week. Wound surface looks better but no major change in dimensions 01/24/2021 upon evaluation today patient appears to be doing well with regard to his leg ulcer. I am actually seeing signs of significant improvement and very pleased in this regard. I do not see any evidence of active infection which is great news. No fevers, chills, nausea, vomiting, or diarrhea. 01/29/2021 upon evaluation today patient appears to be doing well with regard to his leg ulcer. This is not significantly  smaller but is also not any larger I do think at this point switching over to the silver collagen to see if we get  this to speed up would be a good option. We have been using the Iodoflex which I think has been helpful but nonetheless I think we might be able to do something to speed this up a bit at this point. 02/04/21; right lateral calf which was initially a dog bite. We have been using silver collagen covered in Tubigrip changing 3 times a week. They are moistening this with saline but apparently the collagen is staying moist. Dimensions down by half a centimeter 02/11/2021 upon evaluation today patient's wound on the leg actually appears to be doing quite well. I am actually very pleased with where things stand today. I do not see any evidence of active infection locally nor systemically which is great news. Overall I feel like that we are making excellent progress and I am extremely pleased with where we stand at this point. He does have some irritation from the tape we can work on keeping tape off of the skin going forward. 02/18/2021 upon evaluation today patient appears to be doing well with regard to his wound. He has been tolerating the dressing changes without complication. Fortunately I do not see any signs of active infection locally nor systemically at this time which is great news. No fevers, chills, nausea, vomiting, or diarrhea. 02/26/2021 upon evaluation today patient appears to be doing well with regard to his original wound at this in fact is showing signs of excellent improvement which is great news. Overall I am extremely pleased with what I see today. He does have however a small area more proximal that has been somewhat for open. This was due to aggressive cleaning when his wife was changing the dressings. She was try to get some of the stuck on collagen off. Nonetheless I think this is just a very minor setback and something that we get under control fairly quickly for him here. Electronic Signature(s) Signed: 02/26/2021 1:56:19 PM By: Worthy Keeler PA-C Entered By: Worthy Keeler on  02/26/2021 13:56:19 RAMEY, SCHIFF ([id number]) -------------------------------------------------------------------------------- Physical Exam Details Patient Name: Billy Franco Date of Service: 02/26/2021 1:15 PM Medical Record Number: [id number] Patient Account Number: [id number] Date of Birth/Sex: 1941-01-08 (80 y.o. M) Treating RN: Carlene Coria Primary Care Provider: Steele Sizer Other Clinician: Referring Provider: Steele Sizer Treating Provider/Extender: Skipper Cliche in Treatment: 9 Constitutional Well-nourished and well-hydrated in no acute distress. Respiratory normal breathing without difficulty. Psychiatric this patient is able to make decisions and demonstrates good insight into disease process. Alert and Oriented x 3. pleasant and cooperative. Notes Upon inspection patient's wound bed actually showed signs of fairly good granulation epithelization and actually very pleased with where we stand today. I do not see any signs of active infection at this time which is great news and overall I think that the patient is doing quite well. Electronic Signature(s) Signed: 02/26/2021 1:56:47 PM By: Worthy Keeler PA-C Entered By: Worthy Keeler on 02/26/2021 13:56:46 Billy Franco ([id number]) -------------------------------------------------------------------------------- Physician Orders Details Patient Name: Billy Franco Date of Service: 02/26/2021 1:15 PM Medical Record Number: [id number] Patient Account Number: [id number] Date of Birth/Sex: [birth year] (80 y.o. M) Treating RN: Carlene Coria Primary Care Provider: Steele Sizer Other Clinician: Referring Provider: Steele Sizer Treating Provider/Extender: Skipper Cliche in Treatment: 9 Verbal / Phone Orders: No Diagnosis Coding ICD-10 Coding Code Description W54.0XXA Bitten by dog, initial encounter [id number] Non-pressure  chronic ulcer of other part of right lower leg with fat layer exposed E11.622 Type 2  diabetes mellitus with other skin ulcer I50.42 Chronic combined systolic (congestive) and diastolic (congestive) heart failure I48.0 Paroxysmal atrial fibrillation I10 Essential (primary) hypertension Follow-up Appointments o Return Appointment in 1 week. Bathing/ Shower/ Hygiene o May shower; gently cleanse wound with antibacterial soap, rinse and pat dry prior to dressing wounds - may shower on days dressing changed, remove dressing, shower, reapply dressing Edema Control - Lymphedema / Segmental Compressive Device / Other o Tubigrip single layer applied. - side c o Elevate, Exercise Daily and Avoid Standing for Long Periods of Time. o Elevate legs to the level of the heart and pump ankles as often as possible o Elevate leg(s) parallel to the floor when sitting. o Other: - mepitel to open area over wound Wound Treatment Wound #1 - Lower Leg Wound Laterality: Right, Lateral Cleanser: Byram Ancillary Kit - 15 Day Supply (Generic) 3 x Per Week/30 Days Discharge Instructions: Use supplies as instructed; Kit contains: (15) Saline Bullets; (15) 3x3 Gauze; 15 pr Gloves Cleanser: Soap and Water 3 x Per Week/30 Days Discharge Instructions: Gently cleanse wound with antibacterial soap, rinse and pat dry prior to dressing wounds Primary Dressing: Prisma 4.34 (in) 3 x Per Week/30 Days Discharge Instructions: moisten with hydrogel or KY gel . Secondary Dressing: Gauze 3 x Per Week/30 Days Discharge Instructions: As directed: dry, moistened with saline or moistened with Dakins Solution Secured With: 58M Medipore H Soft Cloth Surgical Tape, 2x2 (in/yd) (Generic) 3 x Per Week/30 Days Discharge Instructions: no tape on skin Compression Wrap: tubi grip 3 x Per Week/30 Days Discharge Instructions: size c Electronic Signature(s) Signed: 02/26/2021 4:09:23 PM By: Worthy Keeler PA-C Signed: 02/28/2021 8:26:07 AM By: Carlene Coria RN Entered By: Carlene Coria on 02/26/2021 14:03:55 Billy Franco ([id number]) -------------------------------------------------------------------------------- Problem List Details Patient Name: Billy Franco Date of Service: 02/26/2021 1:15 PM Medical Record Number: [id number] Patient Account Number: [id number] Date of Birth/Sex: [birth year] (80 y.o. M) Treating RN: Carlene Coria Primary Care Provider: Steele Sizer Other Clinician: Referring Provider: Steele Sizer Treating Provider/Extender: Skipper Cliche in Treatment: 9 Active Problems ICD-10 Encounter Code Description Active Date MDM Diagnosis W54.0XXA Bitten by dog, initial encounter 12/24/2020 No Yes L97.812 Non-pressure chronic ulcer of other part of right lower leg with fat layer 12/24/2020 No Yes exposed E11.622 Type 2 diabetes mellitus with other skin ulcer 12/24/2020 No Yes I50.42 Chronic combined systolic (congestive) and diastolic (congestive) heart 12/24/2020 No Yes failure I48.0 Paroxysmal atrial fibrillation 12/24/2020 No Yes I10 Essential (primary) hypertension 12/24/2020 No Yes Inactive Problems Resolved Problems Electronic Signature(s) Signed: 02/26/2021 1:04:59 PM By: Worthy Keeler PA-C Entered By: Worthy Keeler on 02/26/2021 13:04:58 Billy Franco ([id number]) -------------------------------------------------------------------------------- Progress Note Details Patient Name: Billy Franco Date of Service: 02/26/2021 1:15 PM Medical Record Number: [id number] Patient Account Number: [id number] Date of Birth/Sex: 03-Aug-1940 (80 y.o. M) Treating RN: Carlene Coria Primary Care Provider: Steele Sizer Other Clinician: Referring Provider: Steele Sizer Treating Provider/Extender: Skipper Cliche in Treatment: 9 Subjective Chief Complaint Information obtained from Patient Right Leg Dog Bite History of Present Illness (HPI) 12/24/2020 upon evaluation today patient appears to be doing somewhat poorly in regard to the wound that actually occurred around November 13 2020. This was actually a dog bite he is unsure of what type of dog it was it was a small wound that wound was not a familiar dog to him. Subsequently he ended up being seen  by his primary care provider in October 28 by his primary care provider. Subsequently they originally placed him on 2 rounds of amoxicillin he is also been on doxycycline. Unfortunately this just does not seem to be getting better despite the treatment up to this point they have been initially cleaning with alcohol although that has been discontinued when he went to the ER on November 14. They actually recommended cleaning with just soap and water. There is really not been any specific dressings that were recommended at this time. Subsequently the patient does have an ABI of 1.3 here in the clinic today. This is on the right. Subsequently also has a recent hemoglobin A1c from when he was at the ER of 6.3. He really is not having any significant pain which is good news he does have some swelling which is going to need to be addressed as well. This is minimal however I do not think he has to have a compression wrap I think Tubigrip would probably be appropriate. Patient does have a history of diabetes mellitus type 2, congestive heart failure, atrial fibrillation, and hypertension. 01/01/2021 upon evaluation today patient appears to be doing pretty well in regard to his wound. Fortunately I do not see any signs of infection and in 1 week's time I feel like we have made some good progress here. No fevers, chills, nausea, vomiting, or diarrhea. 01/08/2021 upon evaluation today patient's wound at this point is showing signs of doing really about the same. I am not seeing as much improvement as I would like to see. I think at this point that I would recommend we see about actually using a different medication I am thinking Iodosorb could be beneficial for him. He is in agreement with given something else to try here. 12/27; use  Iodoflex starting last week. Wound surface looks better but no major change in dimensions 01/24/2021 upon evaluation today patient appears to be doing well with regard to his leg ulcer. I am actually seeing signs of significant improvement and very pleased in this regard. I do not see any evidence of active infection which is great news. No fevers, chills, nausea, vomiting, or diarrhea. 01/29/2021 upon evaluation today patient appears to be doing well with regard to his leg ulcer. This is not significantly smaller but is also not any larger I do think at this point switching over to the silver collagen to see if we get this to speed up would be a good option. We have been using the Iodoflex which I think has been helpful but nonetheless I think we might be able to do something to speed this up a bit at this point. 02/04/21; right lateral calf which was initially a dog bite. We have been using silver collagen covered in Tubigrip changing 3 times a week. They are moistening this with saline but apparently the collagen is staying moist. Dimensions down by half a centimeter 02/11/2021 upon evaluation today patient's wound on the leg actually appears to be doing quite well. I am actually very pleased with where things stand today. I do not see any evidence of active infection locally nor systemically which is great news. Overall I feel like that we are making excellent progress and I am extremely pleased with where we stand at this point. He does have some irritation from the tape we can work on keeping tape off of the skin going forward. 02/18/2021 upon evaluation today patient appears to be doing well with regard to his wound.  He has been tolerating the dressing changes without complication. Fortunately I do not see any signs of active infection locally nor systemically at this time which is great news. No fevers, chills, nausea, vomiting, or diarrhea. 02/26/2021 upon evaluation today patient appears to be  doing well with regard to his original wound at this in fact is showing signs of excellent improvement which is great news. Overall I am extremely pleased with what I see today. He does have however a small area more proximal that has been somewhat for open. This was due to aggressive cleaning when his wife was changing the dressings. She was try to get some of the stuck on collagen off. Nonetheless I think this is just a very minor setback and something that we get under control fairly quickly for him here. Objective Constitutional Well-nourished and well-hydrated in no acute distress. MONTRELLE, EDDINGS ([id number]) Vitals Time Taken: 1:16 PM, Height: 67 in, Weight: 150 lbs, BMI: 23.5, Temperature: 98.4 F, Pulse: 56 bpm, Respiratory Rate: 18 breaths/min, Blood Pressure: 97/38 mmHg. Respiratory normal breathing without difficulty. Psychiatric this patient is able to make decisions and demonstrates good insight into disease process. Alert and Oriented x 3. pleasant and cooperative. General Notes: Upon inspection patient's wound bed actually showed signs of fairly good granulation epithelization and actually very pleased with where we stand today. I do not see any signs of active infection at this time which is great news and overall I think that the patient is doing quite well. Integumentary (Hair, Skin) Wound #1 status is Open. Original cause of wound was Bite. The date acquired was: 11/16/2020. The wound has been in treatment 9 weeks. The wound is located on the Right,Lateral Lower Leg. The wound measures 0.6cm length x 0.4cm width x 0.2cm depth; 0.188cm^2 area and 0.038cm^3 volume. There is Fat Layer (Subcutaneous Tissue) exposed. There is no tunneling or undermining noted. There is a medium amount of serosanguineous drainage noted. There is large (67-100%) red granulation within the wound bed. There is a small (1-33%) amount of necrotic tissue within the wound bed including Adherent  Slough. Assessment Active Problems ICD-10 Bitten by dog, initial encounter Non-pressure chronic ulcer of other part of right lower leg with fat layer exposed Type 2 diabetes mellitus with other skin ulcer Chronic combined systolic (congestive) and diastolic (congestive) heart failure Paroxysmal atrial fibrillation Essential (primary) hypertension Plan Follow-up Appointments: Return Appointment in 1 week. Bathing/ Shower/ Hygiene: May shower; gently cleanse wound with antibacterial soap, rinse and pat dry prior to dressing wounds - may shower on days dressing changed, remove dressing, shower, reapply dressing Edema Control - Lymphedema / Segmental Compressive Device / Other: Tubigrip single layer applied. - side c Elevate, Exercise Daily and Avoid Standing for Long Periods of Time. Elevate legs to the level of the heart and pump ankles as often as possible Elevate leg(s) parallel to the floor when sitting. Other: - mepitel to open area over wound WOUND #1: - Lower Leg Wound Laterality: Right, Lateral Cleanser: Byram Ancillary Kit - 15 Day Supply (Generic) 3 x Per Week/30 Days Discharge Instructions: Use supplies as instructed; Kit contains: (15) Saline Bullets; (15) 3x3 Gauze; 15 pr Gloves Cleanser: Soap and Water 3 x Per Week/30 Days Discharge Instructions: Gently cleanse wound with antibacterial soap, rinse and pat dry prior to dressing wounds Primary Dressing: Prisma 4.34 (in) 3 x Per Week/30 Days Discharge Instructions: Moisten w/normal saline or sterile water; Cover wound as directed. Do not remove from wound bed. Secondary Dressing: Gauze 3  x Per Week/30 Days Discharge Instructions: As directed: dry, moistened with saline or moistened with Dakins Solution Secured With: 42M Medipore H Soft Cloth Surgical Tape, 2x2 (in/yd) (Generic) 3 x Per Week/30 Days Discharge Instructions: no tape on skin Compression Wrap: tubi grip 3 x Per Week/30 Days Discharge Instructions: size c 1.  Number recommend currently that we go ahead and continue with the silver collagen that we will moistened with hydrogel to try to see if we can keep this little bit more moist. 2. I am also can recommend that we have the patient continue with the dressing changes with a border foam as well to cover and protect I think this is good. FRIEDEL, Tarrance ([id number]) 3. We will use Mepitel over top to try to protect the area and allow the new small skin tear areas to heal and subsequently this should also keep the area moist so it does not dry out as frequently which I think will also be beneficial. We will see patient back for reevaluation in 1 week here in the clinic. If anything worsens or changes patient will contact our office for additional recommendations. Electronic Signature(s) Signed: 02/26/2021 1:57:19 PM By: Worthy Keeler PA-C Entered By: Worthy Keeler on 02/26/2021 13:57:19 Billy Franco ([id number]) -------------------------------------------------------------------------------- SuperBill Details Patient Name: Billy Franco Date of Service: 02/26/2021 Medical Record Number: [id number] Patient Account Number: [id number] Date of Birth/Sex: 17-Oct-1940 (80 y.o. M) Treating RN: Carlene Coria Primary Care Provider: Steele Sizer Other Clinician: Referring Provider: Steele Sizer Treating Provider/Extender: Skipper Cliche in Treatment: 9 Diagnosis Coding ICD-10 Codes Code Description W54.0XXA Bitten by dog, initial encounter [phone] Non-pressure chronic ulcer of other part of right lower leg with fat layer exposed E11.622 Type 2 diabetes mellitus with other skin ulcer I50.42 Chronic combined systolic (congestive) and diastolic (congestive) heart failure I48.0 Paroxysmal atrial fibrillation I10 Essential (primary) hypertension Facility Procedures CPT4 Code: [id number] Description: 99213 - WOUND CARE VISIT-LEV 3 EST PT Modifier: Quantity: 1 Physician Procedures CPT4 Code:  1028902 Description: 28406 - WC PHYS LEVEL 3 - EST PT Modifier: Quantity: 1 CPT4 Code: Description: ICD-10 Diagnosis Description W54.0XXA Bitten by dog, initial encounter [phone] Non-pressure chronic ulcer of other part of right lower leg with fat la E11.622 Type 2 diabetes mellitus with other skin ulcer I50.42 Chronic combined systolic  (congestive) and diastolic (congestive) heart Modifier: yer exposed failure Quantity: Electronic Signature(s) Signed: 02/26/2021 1:57:55 PM By: Worthy Keeler PA-C Entered By: Worthy Keeler on 02/26/2021 13:57:54

## 2021-03-04 ENCOUNTER — Other Ambulatory Visit: Payer: Self-pay

## 2021-03-04 ENCOUNTER — Encounter: Payer: Medicare Other | Admitting: Physician Assistant

## 2021-03-04 DIAGNOSIS — E119 Type 2 diabetes mellitus without complications: Secondary | ICD-10-CM | POA: Diagnosis not present

## 2021-03-04 DIAGNOSIS — I110 Hypertensive heart disease with heart failure: Secondary | ICD-10-CM | POA: Diagnosis not present

## 2021-03-04 DIAGNOSIS — I480 Paroxysmal atrial fibrillation: Secondary | ICD-10-CM | POA: Diagnosis not present

## 2021-03-04 DIAGNOSIS — E11622 Type 2 diabetes mellitus with other skin ulcer: Secondary | ICD-10-CM | POA: Diagnosis not present

## 2021-03-04 DIAGNOSIS — L97812 Non-pressure chronic ulcer of other part of right lower leg with fat layer exposed: Secondary | ICD-10-CM | POA: Diagnosis not present

## 2021-03-04 DIAGNOSIS — S81851A Open bite, right lower leg, initial encounter: Secondary | ICD-10-CM | POA: Diagnosis not present

## 2021-03-04 DIAGNOSIS — I10 Essential (primary) hypertension: Secondary | ICD-10-CM | POA: Diagnosis not present

## 2021-03-04 DIAGNOSIS — I5042 Chronic combined systolic (congestive) and diastolic (congestive) heart failure: Secondary | ICD-10-CM | POA: Diagnosis not present

## 2021-03-04 NOTE — Progress Notes (Addendum)
[name], [name] ([id number]) Visit Report for 03/04/2021 Chief Complaint Document Details Patient Name: Billy, Franco Date of Service: 03/04/2021 10:15 AM Medical Record Number: [id number] Patient Account Number: [id number] Date of Birth/Sex: Aug 22, 1940 (81 y.o. M) Treating RN: Carlene Coria Primary Care Provider: Steele Sizer Other Clinician: Referring Provider: Steele Sizer Treating Provider/Extender: Skipper Cliche in Treatment: 10 Information Obtained from: Patient Chief Complaint Right Leg Dog Bite Electronic Signature(s) Signed: 03/04/2021 10:10:42 AM By: Worthy Keeler PA-C Entered By: Worthy Keeler on 03/04/2021 10:10:42 Billy Franco ([id number]) -------------------------------------------------------------------------------- HPI Details Patient Name: Billy Franco Date of Service: 03/04/2021 10:15 AM Medical Record Number: [id number] Patient Account Number: [id number] Date of Birth/Sex: 09-01-1940 (80 y.o. M) Treating RN: Carlene Coria Primary Care Provider: Steele Sizer Other Clinician: Referring Provider: Steele Sizer Treating Provider/Extender: Skipper Cliche in Treatment: 10 History of Present Illness HPI Description: 12/24/2020 upon evaluation today patient appears to be doing somewhat poorly in regard to the wound that actually occurred around November 13 2020. This was actually a dog bite he is unsure of what type of dog it was it was a small wound that wound was not a familiar dog to him. Subsequently he ended up being seen by his primary care provider in October 28 by his primary care provider. Subsequently they originally placed him on 2 rounds of amoxicillin he is also been on doxycycline. Unfortunately this just does not seem to be getting better despite the treatment up to this point they have been initially cleaning with alcohol although that has been discontinued when he went to the ER on November 14. They actually recommended cleaning with  just soap and water. There is really not been any specific dressings that were recommended at this time. Subsequently the patient does have an ABI of 1.3 here in the clinic today. This is on the right. Subsequently also has a recent hemoglobin A1c from when he was at the ER of 6.3. He really is not having any significant pain which is good news he does have some swelling which is going to need to be addressed as well. This is minimal however I do not think he has to have a compression wrap I think Tubigrip would probably be appropriate. Patient does have a history of diabetes mellitus type 2, congestive heart failure, atrial fibrillation, and hypertension. 01/01/2021 upon evaluation today patient appears to be doing pretty well in regard to his wound. Fortunately I do not see any signs of infection and in 1 week's time I feel like we have made some good progress here. No fevers, chills, nausea, vomiting, or diarrhea. 01/08/2021 upon evaluation today patient's wound at this point is showing signs of doing really about the same. I am not seeing as much improvement as I would like to see. I think at this point that I would recommend we see about actually using a different medication I am thinking Iodosorb could be beneficial for him. He is in agreement with given something else to try here. 12/27; use Iodoflex starting last week. Wound surface looks better but no major change in dimensions 01/24/2021 upon evaluation today patient appears to be doing well with regard to his leg ulcer. I am actually seeing signs of significant improvement and very pleased in this regard. I do not see any evidence of active infection which is great news. No fevers, chills, nausea, vomiting, or diarrhea. 01/29/2021 upon evaluation today patient appears to be doing well with regard to his leg ulcer. This is not significantly  smaller but is also not any larger I do think at this point switching over to the silver collagen to see  if we get this to speed up would be a good option. We have been using the Iodoflex which I think has been helpful but nonetheless I think we might be able to do something to speed this up a bit at this point. 02/04/21; right lateral calf which was initially a dog bite. We have been using silver collagen covered in Tubigrip changing 3 times a week. They are moistening this with saline but apparently the collagen is staying moist. Dimensions down by half a centimeter 02/11/2021 upon evaluation today patient's wound on the leg actually appears to be doing quite well. I am actually very pleased with where things stand today. I do not see any evidence of active infection locally nor systemically which is great news. Overall I feel like that we are making excellent progress and I am extremely pleased with where we stand at this point. He does have some irritation from the tape we can work on keeping tape off of the skin going forward. 02/18/2021 upon evaluation today patient appears to be doing well with regard to his wound. He has been tolerating the dressing changes without complication. Fortunately I do not see any signs of active infection locally nor systemically at this time which is great news. No fevers, chills, nausea, vomiting, or diarrhea. 02/26/2021 upon evaluation today patient appears to be doing well with regard to his original wound at this in fact is showing signs of excellent improvement which is great news. Overall I am extremely pleased with what I see today. He does have however a small area more proximal that has been somewhat for open. This was due to aggressive cleaning when his wife was changing the dressings. She was try to get some of the stuck on collagen off. Nonetheless I think this is just a very minor setback and something that we get under control fairly quickly for him here. 03/04/2021 upon evaluation today patient appears to be doing well with regard to his wound. Fortunately  this is showing signs of measuring smaller. There still is an open wound however at this time. I think that switching to Xeroform and probably do quite well for him currently. Electronic Signature(s) Signed: 03/04/2021 10:35:33 AM By: Worthy Keeler PA-C Entered By: Worthy Keeler on 03/04/2021 10:35:33 Billy Franco ([id number]) -------------------------------------------------------------------------------- Physical Exam Details Patient Name: Billy Franco Date of Service: 03/04/2021 10:15 AM Medical Record Number: [id number] Patient Account Number: [id number] Date of Birth/Sex: 1940-11-13 (80 y.o. M) Treating RN: Carlene Coria Primary Care Provider: Steele Sizer Other Clinician: Referring Provider: Steele Sizer Treating Provider/Extender: Skipper Cliche in Treatment: 71 Constitutional Well-nourished and well-hydrated in no acute distress. Respiratory normal breathing without difficulty. Psychiatric this patient is able to make decisions and demonstrates good insight into disease process. Alert and Oriented x 3. pleasant and cooperative. Notes Upon inspection patient's wound bed actually showed signs of good granulation and epithelization at this point. Fortunately I do not see any evidence of active infection locally or systemically which is great news and overall I am extremely pleased with where we stand today. Electronic Signature(s) Signed: 03/04/2021 10:35:48 AM By: Worthy Keeler PA-C Entered By: Worthy Keeler on 03/04/2021 10:35:47 Billy Franco ([id number]) -------------------------------------------------------------------------------- Physician Orders Details Patient Name: Billy Franco Date of Service: 03/04/2021 10:15 AM Medical Record Number: [id number] Patient Account Number: [id number] Date of Birth/Sex: [birth year] (80 y.o. M)  Treating RN: Carlene Coria Primary Care Provider: Steele Sizer Other Clinician: Referring Provider: Steele Sizer Treating Provider/Extender: Skipper Cliche in Treatment: 10 Verbal / Phone Orders: No Diagnosis Coding ICD-10 Coding Code Description W54.0XXA Bitten by dog, initial encounter [phone] Non-pressure chronic ulcer of other part of right lower leg with fat layer exposed E11.622 Type 2 diabetes mellitus with other skin ulcer I50.42 Chronic combined systolic (congestive) and diastolic (congestive) heart failure I48.0 Paroxysmal atrial fibrillation I10 Essential (primary) hypertension Follow-up Appointments o Return Appointment in 1 week. Bathing/ Shower/ Hygiene o May shower; gently cleanse wound with antibacterial soap, rinse and pat dry prior to dressing wounds - may shower on days dressing changed, remove dressing, shower, reapply dressing Edema Control - Lymphedema / Segmental Compressive Device / Other o Tubigrip single layer applied. - side c o Elevate, Exercise Daily and Avoid Standing for Long Periods of Time. o Elevate legs to the level of the heart and pump ankles as often as possible o Elevate leg(s) parallel to the floor when sitting. o Other: - mepitel to open area over wound Wound Treatment Wound #1 - Lower Leg Wound Laterality: Right, Lateral Cleanser: Byram Ancillary Kit - 15 Day Supply (Generic) 3 x Per Week/30 Days Discharge Instructions: Use supplies as instructed; Kit contains: (15) Saline Bullets; (15) 3x3 Gauze; 15 pr Gloves Cleanser: Soap and Water 3 x Per Week/30 Days Discharge Instructions: Gently cleanse wound with antibacterial soap, rinse and pat dry prior to dressing wounds Primary Dressing: Xeroform-HBD 2x2 (in/in) 3 x Per Week/30 Days Discharge Instructions: Apply Xeroform-HBD 2x2 (in/in) as directed Secondary Dressing: Gauze 3 x Per Week/30 Days Discharge Instructions: As directed: dry, moistened with saline or moistened with Dakins Solution Secured With: 59M Medipore H Soft Cloth Surgical Tape, 2x2 (in/yd) (Generic) 3 x Per Week/30  Days Discharge Instructions: no tape on skin Compression Wrap: tubi grip 3 x Per Week/30 Days Discharge Instructions: size c Electronic Signature(s) Signed: 03/04/2021 10:57:22 AM By: Worthy Keeler PA-C Signed: 03/04/2021 3:16:27 PM By: Carlene Coria RN Entered By: Carlene Coria on 03/04/2021 10:30:50 Billy Franco ([id number]) -------------------------------------------------------------------------------- Problem List Details Patient Name: Billy Franco Date of Service: 03/04/2021 10:15 AM Medical Record Number: [id number] Patient Account Number: [id number] Date of Birth/Sex: 05-23-1940 (80 y.o. M) Treating RN: Carlene Coria Primary Care Provider: Steele Sizer Other Clinician: Referring Provider: Steele Sizer Treating Provider/Extender: Skipper Cliche in Treatment: 10 Active Problems ICD-10 Encounter Code Description Active Date MDM Diagnosis W54.0XXA Bitten by dog, initial encounter 12/24/2020 No Yes L97.812 Non-pressure chronic ulcer of other part of right lower leg with fat layer 12/24/2020 No Yes exposed E11.622 Type 2 diabetes mellitus with other skin ulcer 12/24/2020 No Yes I50.42 Chronic combined systolic (congestive) and diastolic (congestive) heart 12/24/2020 No Yes failure I48.0 Paroxysmal atrial fibrillation 12/24/2020 No Yes I10 Essential (primary) hypertension 12/24/2020 No Yes Inactive Problems Resolved Problems Electronic Signature(s) Signed: 03/04/2021 10:10:32 AM By: Worthy Keeler PA-C Entered By: Worthy Keeler on 03/04/2021 10:10:31 Billy Franco ([id number]) -------------------------------------------------------------------------------- Progress Note Details Patient Name: Billy Franco Date of Service: 03/04/2021 10:15 AM Medical Record Number: [id number] Patient Account Number: [id number] Date of Birth/Sex: 12-Oct-1940 (80 y.o. M) Treating RN: Carlene Coria Primary Care Provider: Steele Sizer Other Clinician: Referring Provider: Steele Sizer Treating Provider/Extender: Skipper Cliche in Treatment: 10 Subjective Chief Complaint Information obtained from Patient Right Leg Dog Bite History of Present Illness (HPI) 12/24/2020 upon evaluation today patient appears to be doing somewhat poorly in regard to the wound that actually  occurred around November 13 2020. This was actually a dog bite he is unsure of what type of dog it was it was a small wound that wound was not a familiar dog to him. Subsequently he ended up being seen by his primary care provider in October 28 by his primary care provider. Subsequently they originally placed him on 2 rounds of amoxicillin he is also been on doxycycline. Unfortunately this just does not seem to be getting better despite the treatment up to this point they have been initially cleaning with alcohol although that has been discontinued when he went to the ER on November 14. They actually recommended cleaning with just soap and water. There is really not been any specific dressings that were recommended at this time. Subsequently the patient does have an ABI of 1.3 here in the clinic today. This is on the right. Subsequently also has a recent hemoglobin A1c from when he was at the ER of 6.3. He really is not having any significant pain which is good news he does have some swelling which is going to need to be addressed as well. This is minimal however I do not think he has to have a compression wrap I think Tubigrip would probably be appropriate. Patient does have a history of diabetes mellitus type 2, congestive heart failure, atrial fibrillation, and hypertension. 01/01/2021 upon evaluation today patient appears to be doing pretty well in regard to his wound. Fortunately I do not see any signs of infection and in 1 week's time I feel like we have made some good progress here. No fevers, chills, nausea, vomiting, or diarrhea. 01/08/2021 upon evaluation today patient's wound at this point is  showing signs of doing really about the same. I am not seeing as much improvement as I would like to see. I think at this point that I would recommend we see about actually using a different medication I am thinking Iodosorb could be beneficial for him. He is in agreement with given something else to try here. 12/27; use Iodoflex starting last week. Wound surface looks better but no major change in dimensions 01/24/2021 upon evaluation today patient appears to be doing well with regard to his leg ulcer. I am actually seeing signs of significant improvement and very pleased in this regard. I do not see any evidence of active infection which is great news. No fevers, chills, nausea, vomiting, or diarrhea. 01/29/2021 upon evaluation today patient appears to be doing well with regard to his leg ulcer. This is not significantly smaller but is also not any larger I do think at this point switching over to the silver collagen to see if we get this to speed up would be a good option. We have been using the Iodoflex which I think has been helpful but nonetheless I think we might be able to do something to speed this up a bit at this point. 02/04/21; right lateral calf which was initially a dog bite. We have been using silver collagen covered in Tubigrip changing 3 times a week. They are moistening this with saline but apparently the collagen is staying moist. Dimensions down by half a centimeter 02/11/2021 upon evaluation today patient's wound on the leg actually appears to be doing quite well. I am actually very pleased with where things stand today. I do not see any evidence of active infection locally nor systemically which is great news. Overall I feel like that we are making excellent progress and I am extremely pleased with  where we stand at this point. He does have some irritation from the tape we can work on keeping tape off of the skin going forward. 02/18/2021 upon evaluation today patient appears to be  doing well with regard to his wound. He has been tolerating the dressing changes without complication. Fortunately I do not see any signs of active infection locally nor systemically at this time which is great news. No fevers, chills, nausea, vomiting, or diarrhea. 02/26/2021 upon evaluation today patient appears to be doing well with regard to his original wound at this in fact is showing signs of excellent improvement which is great news. Overall I am extremely pleased with what I see today. He does have however a small area more proximal that has been somewhat for open. This was due to aggressive cleaning when his wife was changing the dressings. She was try to get some of the stuck on collagen off. Nonetheless I think this is just a very minor setback and something that we get under control fairly quickly for him here. 03/04/2021 upon evaluation today patient appears to be doing well with regard to his wound. Fortunately this is showing signs of measuring smaller. There still is an open wound however at this time. I think that switching to Xeroform and probably do quite well for him currently. Objective Hyson, Jordany ([id number]) Constitutional Well-nourished and well-hydrated in no acute distress. Vitals Time Taken: 10:14 AM, Height: 67 in, Weight: 150 lbs, BMI: 23.5, Temperature: 97.7 F, Pulse: 51 bpm, Respiratory Rate: 18 breaths/min, Blood Pressure: 118/59 mmHg. Respiratory normal breathing without difficulty. Psychiatric this patient is able to make decisions and demonstrates good insight into disease process. Alert and Oriented x 3. pleasant and cooperative. General Notes: Upon inspection patient's wound bed actually showed signs of good granulation and epithelization at this point. Fortunately I do not see any evidence of active infection locally or systemically which is great news and overall I am extremely pleased with where we stand today. Integumentary (Hair, Skin) Wound #1  status is Open. Original cause of wound was Bite. The date acquired was: 11/16/2020. The wound has been in treatment 10 weeks. The wound is located on the Right,Lateral Lower Leg. The wound measures 0.1cm length x 0.1cm width x 0.1cm depth; 0.008cm^2 area and 0.001cm^3 volume. There is Fat Layer (Subcutaneous Tissue) exposed. There is no tunneling or undermining noted. There is a none present amount of drainage noted. There is large (67-100%) red granulation within the wound bed. There is no necrotic tissue within the wound bed. Assessment Active Problems ICD-10 Bitten by dog, initial encounter Non-pressure chronic ulcer of other part of right lower leg with fat layer exposed Type 2 diabetes mellitus with other skin ulcer Chronic combined systolic (congestive) and diastolic (congestive) heart failure Paroxysmal atrial fibrillation Essential (primary) hypertension Plan Follow-up Appointments: Return Appointment in 1 week. Bathing/ Shower/ Hygiene: May shower; gently cleanse wound with antibacterial soap, rinse and pat dry prior to dressing wounds - may shower on days dressing changed, remove dressing, shower, reapply dressing Edema Control - Lymphedema / Segmental Compressive Device / Other: Tubigrip single layer applied. - side c Elevate, Exercise Daily and Avoid Standing for Long Periods of Time. Elevate legs to the level of the heart and pump ankles as often as possible Elevate leg(s) parallel to the floor when sitting. Other: - mepitel to open area over wound WOUND #1: - Lower Leg Wound Laterality: Right, Lateral Cleanser: Byram Ancillary Kit - 15 Day Supply (Generic) 3 x Per  Week/30 Days Discharge Instructions: Use supplies as instructed; Kit contains: (15) Saline Bullets; (15) 3x3 Gauze; 15 pr Gloves Cleanser: Soap and Water 3 x Per Week/30 Days Discharge Instructions: Gently cleanse wound with antibacterial soap, rinse and pat dry prior to dressing wounds Primary Dressing:  Xeroform-HBD 2x2 (in/in) 3 x Per Week/30 Days Discharge Instructions: Apply Xeroform-HBD 2x2 (in/in) as directed Secondary Dressing: Gauze 3 x Per Week/30 Days Discharge Instructions: As directed: dry, moistened with saline or moistened with Dakins Solution Secured With: 5M Medipore H Soft Cloth Surgical Tape, 2x2 (in/yd) (Generic) 3 x Per Week/30 Days Discharge Instructions: no tape on skin Compression Wrap: tubi grip 3 x Per Week/30 Days Discharge Instructions: size c 1. Would recommend that we going continue with the wound care measures as before and the patient is in agreement the plan. This includes the use of the Xeroform gauze which I think is good to do a good job. TIMBROOK, Nyzir ([id number]) 2. Also can recommend that we have the patient continue with the Adaptic over top and Tubigrip to secure in place after using roll gauze to secure her initially. We will see patient back for reevaluation in 1 week here in the clinic. If anything worsens or changes patient will contact our office for additional recommendations. Electronic Signature(s) Signed: 03/04/2021 10:36:20 AM By: Worthy Keeler PA-C Entered By: Worthy Keeler on 03/04/2021 10:36:20 Billy Franco ([id number]) -------------------------------------------------------------------------------- SuperBill Details Patient Name: Billy Franco Date of Service: 03/04/2021 Medical Record Number: [id number] Patient Account Number: [id number] Date of Birth/Sex: [birth year] (80 y.o. M) Treating RN: Carlene Coria Primary Care Provider: Steele Sizer Other Clinician: Referring Provider: Steele Sizer Treating Provider/Extender: Skipper Cliche in Treatment: 10 Diagnosis Coding ICD-10 Codes Code Description W54.0XXA Bitten by dog, initial encounter [phone] Non-pressure chronic ulcer of other part of right lower leg with fat layer exposed E11.622 Type 2 diabetes mellitus with other skin ulcer I50.42 Chronic combined systolic  (congestive) and diastolic (congestive) heart failure I48.0 Paroxysmal atrial fibrillation I10 Essential (primary) hypertension Facility Procedures CPT4 Code: [id number] Description: 99213 - WOUND CARE VISIT-LEV 3 EST PT Modifier: Quantity: 1 Physician Procedures CPT4 Code: 2951884 Description: 16606 - WC PHYS LEVEL 3 - EST PT Modifier: Quantity: 1 CPT4 Code: Description: ICD-10 Diagnosis Description W54.0XXA Bitten by dog, initial encounter [phone] Non-pressure chronic ulcer of other part of right lower leg with fat la E11.622 Type 2 diabetes mellitus with other skin ulcer I50.42 Chronic combined systolic  (congestive) and diastolic (congestive) heart Modifier: yer exposed failure Quantity: Electronic Signature(s) Signed: 03/04/2021 4:05:04 PM By: Carlene Coria RN Previous Signature: 03/04/2021 10:37:10 AM Version By: Worthy Keeler PA-C Entered By: Carlene Coria on 03/04/2021 16:05:04

## 2021-03-04 NOTE — Progress Notes (Addendum)
YUUKI, SKEENS ([id number]) Visit Report for 03/04/2021 Arrival Information Details Patient Name: Billy Franco, Billy Franco Date of Service: 03/04/2021 10:15 AM Medical Record Number: [id number] Patient Account Number: [id number] Date of Birth/Sex: 11-26-1940 (81 y.o. M) Treating RN: Carlene Coria Primary Care Shanele Nissan: Steele Sizer Other Clinician: Referring Trang Bouse: Steele Sizer Treating Malesha Suliman/Extender: Skipper Cliche in Treatment: 10 Visit Information History Since Last Visit All ordered tests and consults were completed: No Patient Arrived: Ambulatory Added or deleted any medications: No Arrival Time: 10:10 Any new allergies or adverse reactions: No Accompanied By: wife Had a fall or experienced change in No Transfer Assistance: None activities of daily living that may affect Patient Identification Verified: Yes risk of falls: Secondary Verification Process Completed: Yes Signs or symptoms of abuse/neglect since last visito No Patient Requires Transmission-Based No Hospitalized since last visit: No Precautions: Implantable device outside of the clinic excluding No Patient Has Alerts: Yes cellular tissue based products placed in the center Patient Alerts: Patient on Blood since last visit: Thinner Has Dressing in Place as Prescribed: Yes Pain Present Now: No Electronic Signature(s) Signed: 03/04/2021 3:16:27 PM By: Carlene Coria RN Entered By: Carlene Coria on 03/04/2021 10:14:49 Billy Franco ([id number]) -------------------------------------------------------------------------------- Clinic Level of Care Assessment Details Patient Name: Billy Franco Date of Service: 03/04/2021 10:15 AM Medical Record Number: [id number] Patient Account Number: [id number] Date of Birth/Sex: 1940/10/18 (80 y.o. M) Treating RN: Carlene Coria Primary Care Okie Jansson: Steele Sizer Other Clinician: Referring Ander Wamser: Steele Sizer Treating Domonick Sittner/Extender: Skipper Cliche in  Treatment: 10 Clinic Level of Care Assessment Items TOOL 4 Quantity Score X - Use when only an EandM is performed on FOLLOW-UP visit 1 0 ASSESSMENTS - Nursing Assessment / Reassessment X - Reassessment of Co-morbidities (includes updates in patient status) 1 10 X- 1 5 Reassessment of Adherence to Treatment Plan ASSESSMENTS - Wound and Skin Assessment / Reassessment X - Simple Wound Assessment / Reassessment - one wound 1 5 []  - 0 Complex Wound Assessment / Reassessment - multiple wounds []  - 0 Dermatologic / Skin Assessment (not related to wound area) ASSESSMENTS - Focused Assessment []  - Circumferential Edema Measurements - multi extremities 0 []  - 0 Nutritional Assessment / Counseling / Intervention []  - 0 Lower Extremity Assessment (monofilament, tuning fork, pulses) []  - 0 Peripheral Arterial Disease Assessment (using hand held doppler) ASSESSMENTS - Ostomy and/or Continence Assessment and Care []  - Incontinence Assessment and Management 0 []  - 0 Ostomy Care Assessment and Management (repouching, etc.) PROCESS - Coordination of Care X - Simple Patient / Family Education for ongoing care 1 15 []  - 0 Complex (extensive) Patient / Family Education for ongoing care []  - 0 Staff obtains Programmer, systems, Records, Test Results / Process Orders []  - 0 Staff telephones HHA, Nursing Homes / Clarify orders / etc []  - 0 Routine Transfer to another Facility (non-emergent condition) []  - 0 Routine Hospital Admission (non-emergent condition) []  - 0 New Admissions / Biomedical engineer / Ordering NPWT, Apligraf, etc. []  - 0 Emergency Hospital Admission (emergent condition) X- 1 10 Simple Discharge Coordination []  - 0 Complex (extensive) Discharge Coordination PROCESS - Special Needs []  - Pediatric / Minor Patient Management 0 []  - 0 Isolation Patient Management []  - 0 Hearing / Language / Visual special needs []  - 0 Assessment of Community assistance (transportation, D/C  planning, etc.) []  - 0 Additional assistance / Altered mentation []  - 0 Support Surface(s) Assessment (bed, cushion, seat, etc.) INTERVENTIONS - Wound Cleansing / Measurement Cancel, [place] ([id number]) X- 1 5 Simple Wound Cleansing -  one wound []  - 0 Complex Wound Cleansing - multiple wounds X- 1 5 Wound Imaging (photographs - any number of wounds) []  - 0 Wound Tracing (instead of photographs) X- 1 5 Simple Wound Measurement - one wound []  - 0 Complex Wound Measurement - multiple wounds INTERVENTIONS - Wound Dressings X - Small Wound Dressing one or multiple wounds 1 10 []  - 0 Medium Wound Dressing one or multiple wounds []  - 0 Large Wound Dressing one or multiple wounds X- 1 5 Application of Medications - topical []  - 0 Application of Medications - injection INTERVENTIONS - Miscellaneous []  - External ear exam 0 []  - 0 Specimen Collection (cultures, biopsies, blood, body fluids, etc.) []  - 0 Specimen(s) / Culture(s) sent or taken to Lab for analysis []  - 0 Patient Transfer (multiple staff / Civil Service fast streamer / Similar devices) []  - 0 Simple Staple / Suture removal (25 or less) []  - 0 Complex Staple / Suture removal (26 or more) []  - 0 Hypo / Hyperglycemic Management (close monitor of Blood Glucose) []  - 0 Ankle / Brachial Index (ABI) - do not check if billed separately X- 1 5 Vital Signs Has the patient been seen at the hospital within the last three years: Yes Total Score: 80 Level Of Care: New/Established - Level 3 Electronic Signature(s) Signed: 03/07/2021 9:01:13 AM By: Carlene Coria RN Entered By: Carlene Coria on 03/04/2021 16:04:50 Billy Franco ([id number]) -------------------------------------------------------------------------------- Encounter Discharge Information Details Patient Name: Billy Franco Date of Service: 03/04/2021 10:15 AM Medical Record Number: [id number] Patient Account Number: [id number] Date of Birth/Sex: November 26, 1940 (80 y.o.  M) Treating RN: Carlene Coria Primary Care Sadarius Norman: Steele Sizer Other Clinician: Referring Megan Presti: Steele Sizer Treating Neamiah Sciarra/Extender: Skipper Cliche in Treatment: 10 Encounter Discharge Information Items Discharge Condition: Stable Ambulatory Status: Ambulatory Discharge Destination: Home Transportation: Private Auto Accompanied By: wife Schedule Follow-up Appointment: Yes Clinical Summary of Care: Patient Declined Electronic Signature(s) Signed: 03/04/2021 4:06:20 PM By: Carlene Coria RN Entered By: Carlene Coria on 03/04/2021 73:41:93 Billy Franco ([id number]) -------------------------------------------------------------------------------- Lower Extremity Assessment Details Patient Name: Billy Franco Date of Service: 03/04/2021 10:15 AM Medical Record Number: [id number] Patient Account Number: [id number] Date of Birth/Sex: 15-Sep-1940 (80 y.o. M) Treating RN: Carlene Coria Primary Care Kailynne Ferrington: Steele Sizer Other Clinician: Referring Damontre Millea: Steele Sizer Treating Abiola Behring/Extender: Skipper Cliche in Treatment: 10 Edema Assessment Assessed: [Left: No] [Right: No] Edema: [Left: N] [Right: o] Calf Left: Right: Point of Measurement: 36 cm From Medial Instep 32 cm Ankle Left: Right: Point of Measurement: 10 cm From Medial Instep 21 cm Vascular Assessment Pulses: Dorsalis Pedis Palpable: [Right:Yes] Electronic Signature(s) Signed: 03/04/2021 3:16:27 PM By: Carlene Coria RN Entered By: Carlene Coria on 03/04/2021 10:21:08 Billy Franco ([id number]) -------------------------------------------------------------------------------- Multi Wound Chart Details Patient Name: Billy Franco Date of Service: 03/04/2021 10:15 AM Medical Record Number: [id number] Patient Account Number: [id number] Date of Birth/Sex: Mar 19, 1940 (80 y.o. M) Treating RN: Carlene Coria Primary Care Opaline Reyburn: Steele Sizer Other Clinician: Referring Jaylee Freeze: Steele Sizer Treating Danyale Ridinger/Extender: Skipper Cliche in Treatment: 10 Vital Signs Height(in): 45 Pulse(bpm): 2 Weight(lbs): 150 Blood Pressure(mmHg): 118/59 Body Mass Index(BMI): 23.5 Temperature(F): 97.7 Respiratory Rate(breaths/min): 18 Photos: [N/A:N/A] Wound Location: Right, Lateral Lower Leg N/A N/A Wounding Event: Bite N/A N/A Primary Etiology: Diabetic Wound/Ulcer of the Lower N/A N/A Extremity Comorbid History: Congestive Heart Failure, N/A N/A Hypertension, Type II Diabetes Date Acquired: 11/16/2020 N/A N/A Weeks of Treatment: 10 N/A N/A Wound Status: Open N/A N/A Wound Recurrence: No N/A N/A Measurements L x W x  D (cm) 0.1x0.1x0.1 N/A N/A Area (cm) : 0.008 N/A N/A Volume (cm) : 0.001 N/A N/A % Reduction in Area: 99.60% N/A N/A % Reduction in Volume: 99.90% N/A N/A Classification: Grade 2 N/A N/A Exudate Amount: None Present N/A N/A Granulation Amount: Large (67-100%) N/A N/A Granulation Quality: Red N/A N/A Necrotic Amount: None Present (0%) N/A N/A Exposed Structures: Fat Layer (Subcutaneous Tissue): N/A N/A Yes Fascia: No Tendon: No Muscle: No Joint: No Bone: No Epithelialization: None N/A N/A Treatment Notes Electronic Signature(s) Signed: 03/04/2021 3:16:27 PM By: Carlene Coria RN Entered By: Carlene Coria on 03/04/2021 10:29:57 Billy Franco ([id number]) -------------------------------------------------------------------------------- Multi-Disciplinary Care Plan Details Patient Name: Billy Franco Date of Service: 03/04/2021 10:15 AM Medical Record Number: [id number] Patient Account Number: [id number] Date of Birth/Sex: [birth year] (80 y.o. M) Treating RN: Carlene Coria Primary Care Wiatt Mahabir: Steele Sizer Other Clinician: Referring Rumi Kolodziej: Steele Sizer Treating Chevy Virgo/Extender: Skipper Cliche in Treatment: 10 Active Inactive Wound/Skin Impairment Nursing Diagnoses: Knowledge deficit related to ulceration/compromised skin  integrity Goals: Patient/caregiver will verbalize understanding of skin care regimen Date Initiated: 12/24/2020 Date Inactivated: 03/04/2021 Target Resolution Date: 01/24/2021 Goal Status: Met Ulcer/skin breakdown will have a volume reduction of 30% by week 4 Date Initiated: 12/24/2020 Date Inactivated: 03/04/2021 Target Resolution Date: 02/24/2021 Goal Status: Met Ulcer/skin breakdown will have a volume reduction of 50% by week 8 Date Initiated: 12/24/2020 Target Resolution Date: 03/24/2021 Goal Status: Active Ulcer/skin breakdown will have a volume reduction of 80% by week 12 Date Initiated: 12/24/2020 Target Resolution Date: 04/24/2021 Goal Status: Active Ulcer/skin breakdown will heal within 14 weeks Date Initiated: 12/24/2020 Target Resolution Date: 05/24/2021 Goal Status: Active Interventions: Assess patient/caregiver ability to obtain necessary supplies Assess patient/caregiver ability to perform ulcer/skin care regimen upon admission and as needed Assess ulceration(s) every visit Notes: Electronic Signature(s) Signed: 03/04/2021 3:16:27 PM By: Carlene Coria RN Entered By: Carlene Coria on 03/04/2021 10:29:47 Billy Franco ([id number]) -------------------------------------------------------------------------------- Pain Assessment Details Patient Name: Billy Franco Date of Service: 03/04/2021 10:15 AM Medical Record Number: [id number] Patient Account Number: [id number] Date of Birth/Sex: January 13, 1941 (80 y.o. M) Treating RN: Carlene Coria Primary Care Hershel Corkery: Steele Sizer Other Clinician: Referring Abrahim Sargent: Steele Sizer Treating Payeton Germani/Extender: Skipper Cliche in Treatment: 10 Active Problems Location of Pain Severity and Description of Pain Patient Has Paino No Site Locations Pain Management and Medication Current Pain Management: Electronic Signature(s) Signed: 03/04/2021 3:16:27 PM By: Carlene Coria RN Entered By: Carlene Coria on 03/04/2021 10:15:23 Billy Franco ([id number]) -------------------------------------------------------------------------------- Patient/Caregiver Education Details Patient Name: Billy Franco Date of Service: 03/04/2021 10:15 AM Medical Record Number: [id number] Patient Account Number: [id number] Date of Birth/Gender: [birth year] (80 y.o. M) Treating RN: Carlene Coria Primary Care Physician: Steele Sizer Other Clinician: Referring Physician: Steele Sizer Treating Physician/Extender: Skipper Cliche in Treatment: 10 Education Assessment Education Provided To: Patient Education Topics Provided Wound/Skin Impairment: Methods: Explain/Verbal Responses: State content correctly Electronic Signature(s) Signed: 03/07/2021 9:01:13 AM By: Carlene Coria RN Entered By: Carlene Coria on 03/04/2021 [place], [place] ([id number]) -------------------------------------------------------------------------------- Wound Assessment Details Patient Name: Billy Franco Date of Service: 03/04/2021 10:15 AM Medical Record Number: [id number] Patient Account Number: [id number] Date of Birth/Sex: 12-Sep-1940 (80 y.o. M) Treating RN: Carlene Coria Primary Care Gwen Edler: Steele Sizer Other Clinician: Referring Tanda Morrissey: Steele Sizer Treating Jessia Kief/Extender: Skipper Cliche in Treatment: 10 Wound Status Wound Number: 1 Primary Etiology: Diabetic Wound/Ulcer of the Lower Extremity Wound Location: Right, Lateral Lower Leg Wound Status: Open Wounding Event: Bite Comorbid Congestive Heart Failure, Hypertension, Type II History: Diabetes Date Acquired: 11/16/2020  Weeks Of Treatment: 10 Clustered Wound: No Photos Wound Measurements Length: (cm) 0.1 Width: (cm) 0.1 Depth: (cm) 0.1 Area: (cm) 0.008 Volume: (cm) 0.001 % Reduction in Area: 99.6% % Reduction in Volume: 99.9% Epithelialization: None Tunneling: No Undermining: No Wound Description Classification: Grade 2 Exudate Amount: None Present Foul Odor  After Cleansing: No Slough/Fibrino Yes Wound Bed Granulation Amount: Large (67-100%) Exposed Structure Granulation Quality: Red Fascia Exposed: No Necrotic Amount: None Present (0%) Fat Layer (Subcutaneous Tissue) Exposed: Yes Tendon Exposed: No Muscle Exposed: No Joint Exposed: No Bone Exposed: No Treatment Notes Wound #1 (Lower Leg) Wound Laterality: Right, Lateral Cleanser [place] Ancillary Kit - 15 Day Supply Discharge Instruction: Use supplies as instructed; Kit contains: (15) Saline Bullets; (15) 3x3 Gauze; 15 pr Gloves Soap and Water Discharge Instruction: Gently cleanse wound with antibacterial soap, rinse and pat dry prior to dressing wounds Garn, Javed ([id number]) Peri-Wound Care Topical Primary Dressing Xeroform-HBD 2x2 (in/in) Discharge Instruction: Apply Xeroform-HBD 2x2 (in/in) as directed Secondary Dressing Gauze Discharge Instruction: As directed: dry, moistened with saline or moistened with Dakins Solution Secured With 37M Medipore H Soft Cloth Surgical Tape, 2x2 (in/yd) Discharge Instruction: no tape on skin Compression Wrap tubi grip Discharge Instruction: size c Compression Stockings Add-Ons Electronic Signature(s) Signed: 03/04/2021 3:16:27 PM By: Carlene Coria RN Entered By: Carlene Coria on 03/04/2021 10:20:17 Billy Franco ([id number]) -------------------------------------------------------------------------------- Vitals Details Patient Name: Billy Franco Date of Service: 03/04/2021 10:15 AM Medical Record Number: [id number] Patient Account Number: [id number] Date of Birth/Sex: December 19, 1940 (80 y.o. M) Treating RN: Carlene Coria Primary Care Shakiyla Kook: Steele Sizer Other Clinician: Referring Darene Nappi: Steele Sizer Treating Denali Sharma/Extender: Skipper Cliche in Treatment: 10 Vital Signs Time Taken: 10:14 Temperature (F): 97.7 Height (in): 67 Pulse (bpm): 51 Weight (lbs): 150 Respiratory Rate (breaths/min): 18 Body Mass Index  (BMI): 23.5 Blood Pressure (mmHg): 118/59 Reference Range: 80 - 120 mg / dl Electronic Signature(s) Signed: 03/04/2021 3:16:27 PM By: Carlene Coria RN Entered By: Carlene Coria on 03/04/2021 10:15:14

## 2021-03-11 ENCOUNTER — Other Ambulatory Visit: Payer: Self-pay

## 2021-03-11 ENCOUNTER — Encounter: Payer: Medicare Other | Admitting: Physician Assistant

## 2021-03-11 DIAGNOSIS — E11622 Type 2 diabetes mellitus with other skin ulcer: Secondary | ICD-10-CM | POA: Diagnosis not present

## 2021-03-11 DIAGNOSIS — L97812 Non-pressure chronic ulcer of other part of right lower leg with fat layer exposed: Secondary | ICD-10-CM | POA: Diagnosis not present

## 2021-03-11 DIAGNOSIS — I480 Paroxysmal atrial fibrillation: Secondary | ICD-10-CM | POA: Diagnosis not present

## 2021-03-11 DIAGNOSIS — I5042 Chronic combined systolic (congestive) and diastolic (congestive) heart failure: Secondary | ICD-10-CM | POA: Diagnosis not present

## 2021-03-11 DIAGNOSIS — I10 Essential (primary) hypertension: Secondary | ICD-10-CM | POA: Diagnosis not present

## 2021-03-11 DIAGNOSIS — S81851A Open bite, right lower leg, initial encounter: Secondary | ICD-10-CM | POA: Diagnosis not present

## 2021-03-11 DIAGNOSIS — I110 Hypertensive heart disease with heart failure: Secondary | ICD-10-CM | POA: Diagnosis not present

## 2021-03-11 NOTE — Progress Notes (Addendum)
Billy Franco ([id number]) Visit Report for 03/11/2021 Chief Complaint Document Details Patient Name: Billy Franco Date of Service: 03/11/2021 10:15 AM Medical Record Number: [id number] Patient Account Number: [id number] Date of Birth/Sex: 1940-09-23 (81 y.o. M) Treating RN: Carlene Coria Primary Care Provider: Steele Sizer Other Clinician: Referring Provider: Steele Sizer Treating Provider/Extender: Skipper Cliche in Treatment: 11 Information Obtained from: Patient Chief Complaint Right Leg Dog Bite Electronic Signature(s) Signed: 03/11/2021 10:29:04 AM By: Worthy Keeler PA-C Entered By: Worthy Keeler on 03/11/2021 10:29:04 Billy Franco ([id number]) -------------------------------------------------------------------------------- HPI Details Patient Name: Billy Franco Date of Service: 03/11/2021 10:15 AM Medical Record Number: [id number] Patient Account Number: [id number] Date of Birth/Sex: 29-Feb-1940 (80 y.o. M) Treating RN: Carlene Coria Primary Care Provider: Steele Sizer Other Clinician: Referring Provider: Steele Sizer Treating Provider/Extender: Skipper Cliche in Treatment: 11 History of Present Illness HPI Description: 12/24/2020 upon evaluation today patient appears to be doing somewhat poorly in regard to the wound that actually occurred around November 13 2020. This was actually a dog bite he is unsure of what type of dog it was it was a small wound that wound was not a familiar dog to him. Subsequently he ended up being seen by his primary care provider in October 28 by his primary care provider. Subsequently they originally placed him on 2 rounds of amoxicillin he is also been on doxycycline. Unfortunately this just does not seem to be getting better despite the treatment up to this point they have been initially cleaning with alcohol although that has been discontinued when he went to the ER on November 14. They actually recommended cleaning with  just soap and water. There is really not been any specific dressings that were recommended at this time. Subsequently the patient does have an ABI of 1.3 here in the clinic today. This is on the right. Subsequently also has a recent hemoglobin A1c from when he was at the ER of 6.3. He really is not having any significant pain which is good news he does have some swelling which is going to need to be addressed as well. This is minimal however I do not think he has to have a compression wrap I think Tubigrip would probably be appropriate. Patient does have a history of diabetes mellitus type 2, congestive heart failure, atrial fibrillation, and hypertension. 01/01/2021 upon evaluation today patient appears to be doing pretty well in regard to his wound. Fortunately I do not see any signs of infection and in 1 week's time I feel like we have made some good progress here. No fevers, chills, nausea, vomiting, or diarrhea. 01/08/2021 upon evaluation today patient's wound at this point is showing signs of doing really about the same. I am not seeing as much improvement as I would like to see. I think at this point that I would recommend we see about actually using a different medication I am thinking Iodosorb could be beneficial for him. He is in agreement with given something else to try here. 12/27; use Iodoflex starting last week. Wound surface looks better but no major change in dimensions 01/24/2021 upon evaluation today patient appears to be doing well with regard to his leg ulcer. I am actually seeing signs of significant improvement and very pleased in this regard. I do not see any evidence of active infection which is great news. No fevers, chills, nausea, vomiting, or diarrhea. 01/29/2021 upon evaluation today patient appears to be doing well with regard to his leg ulcer. This is not significantly  smaller but is also not any larger I do think at this point switching over to the silver collagen to see  if we get this to speed up would be a good option. We have been using the Iodoflex which I think has been helpful but nonetheless I think we might be able to do something to speed this up a bit at this point. 02/04/21; right lateral calf which was initially a dog bite. We have been using silver collagen covered in Tubigrip changing 3 times a week. They are moistening this with saline but apparently the collagen is staying moist. Dimensions down by half a centimeter 02/11/2021 upon evaluation today patient's wound on the leg actually appears to be doing quite well. I am actually very pleased with where things stand today. I do not see any evidence of active infection locally nor systemically which is great news. Overall I feel like that we are making excellent progress and I am extremely pleased with where we stand at this point. He does have some irritation from the tape we can work on keeping tape off of the skin going forward. 02/18/2021 upon evaluation today patient appears to be doing well with regard to his wound. He has been tolerating the dressing changes without complication. Fortunately I do not see any signs of active infection locally nor systemically at this time which is great news. No fevers, chills, nausea, vomiting, or diarrhea. 02/26/2021 upon evaluation today patient appears to be doing well with regard to his original wound at this in fact is showing signs of excellent improvement which is great news. Overall I am extremely pleased with what I see today. He does have however a small area more proximal that has been somewhat for open. This was due to aggressive cleaning when his wife was changing the dressings. She was try to get some of the stuck on collagen off. Nonetheless I think this is just a very minor setback and something that we get under control fairly quickly for him here. 03/04/2021 upon evaluation today patient appears to be doing well with regard to his wound. Fortunately  this is showing signs of measuring smaller. There still is an open wound however at this time. I think that switching to Xeroform and probably do quite well for him currently. 03/11/2021 upon evaluation today patient appears to be doing well with regard to his wound. In fact this appears to be completely healed which is great news. Fortunately I do not see any signs of active infection locally nor systemically at this point. Electronic Signature(s) Signed: 03/11/2021 10:51:39 AM By: Worthy Keeler PA-C Entered By: Worthy Keeler on 03/11/2021 10:51:39 Billy Franco, Billy Franco ([id number]) -------------------------------------------------------------------------------- Physical Exam Details Patient Name: Billy Franco Date of Service: 03/11/2021 10:15 AM Medical Record Number: [id number] Patient Account Number: [id number] Date of Birth/Sex: 11-Jan-1941 (80 y.o. M) Treating RN: Carlene Coria Primary Care Provider: Steele Sizer Other Clinician: Referring Provider: Steele Sizer Treating Provider/Extender: Skipper Cliche in Treatment: 3 Constitutional Well-nourished and well-hydrated in no acute distress. Respiratory normal breathing without difficulty. Psychiatric this patient is able to make decisions and demonstrates good insight into disease process. Alert and Oriented x 3. pleasant and cooperative. Notes Upon inspection patient's wound bed showed signs of good Epithelization at this point. Fortunately I do not see any evidence of anything being actually open Which is great news. Electronic Signature(s) Signed: 03/11/2021 10:52:19 AM By: Worthy Keeler PA-C Entered By: Worthy Keeler on 03/11/2021 10:52:18 Billy Franco, Billy Franco ([id number]) --------------------------------------------------------------------------------  Physician Orders Details Patient Name: Billy Franco, Billy Franco Date of Service: 03/11/2021 10:15 AM Medical Record Number: [id number] Patient Account Number: [id number] Date of  Birth/Sex: 12-29-1940 (81 y.o. M) Treating RN: Carlene Coria Primary Care Provider: Steele Sizer Other Clinician: Referring Provider: Steele Sizer Treating Provider/Extender: Skipper Cliche in Treatment: 11 Verbal / Phone Orders: No Diagnosis Coding ICD-10 Coding Code Description W54.0XXA Bitten by dog, initial encounter [phone] Non-pressure chronic ulcer of other part of right lower leg with fat layer exposed E11.622 Type 2 diabetes mellitus with other skin ulcer I50.42 Chronic combined systolic (congestive) and diastolic (congestive) heart failure I48.0 Paroxysmal atrial fibrillation I10 Essential (primary) hypertension Discharge From [facility] Services o Discharge from [facility] Electronic Signature(s) Signed: 03/11/2021 5:14:22 PM By: Worthy Keeler PA-C Signed: 03/12/2021 7:54:25 AM By: Carlene Coria RN Entered By: Carlene Coria on 03/11/2021 10:32:04 Billy Franco ([id number]) -------------------------------------------------------------------------------- Problem List Details Patient Name: Billy Franco Date of Service: 03/11/2021 10:15 AM Medical Record Number: [id number] Patient Account Number: [id number] Date of Birth/Sex: 10/07/1940 (80 y.o. M) Treating RN: Carlene Coria Primary Care Provider: Steele Sizer Other Clinician: Referring Provider: Steele Sizer Treating Provider/Extender: Skipper Cliche in Treatment: 11 Active Problems ICD-10 Encounter Code Description Active Date MDM Diagnosis W54.0XXA Bitten by dog, initial encounter 12/24/2020 No Yes L97.812 Non-pressure chronic ulcer of other part of right lower leg with fat layer 12/24/2020 No Yes exposed E11.622 Type 2 diabetes mellitus with other skin ulcer 12/24/2020 No Yes I50.42 Chronic combined systolic (congestive) and diastolic (congestive) heart 12/24/2020 No Yes failure I48.0 Paroxysmal atrial fibrillation 12/24/2020 No Yes I10 Essential (primary) hypertension 12/24/2020  No Yes Inactive Problems Resolved Problems Electronic Signature(s) Signed: 03/11/2021 10:28:54 AM By: Worthy Keeler PA-C Entered By: Worthy Keeler on 03/11/2021 10:28:54 Billy Franco ([id number]) -------------------------------------------------------------------------------- Progress Note Details Patient Name: Billy Franco Date of Service: 03/11/2021 10:15 AM Medical Record Number: [id number] Patient Account Number: [id number] Date of Birth/Sex: 03-23-1940 (80 y.o. M) Treating RN: Carlene Coria Primary Care Provider: Steele Sizer Other Clinician: Referring Provider: Steele Sizer Treating Provider/Extender: Skipper Cliche in Treatment: 11 Subjective Chief Complaint Information obtained from Patient Right Leg Dog Bite History of Present Illness (HPI) 12/24/2020 upon evaluation today patient appears to be doing somewhat poorly in regard to the wound that actually occurred around November 13 2020. This was actually a dog bite he is unsure of what type of dog it was it was a small wound that wound was not a familiar dog to him. Subsequently he ended up being seen by his primary care provider in October 28 by his primary care provider. Subsequently they originally placed him on 2 rounds of amoxicillin he is also been on doxycycline. Unfortunately this just does not seem to be getting better despite the treatment up to this point they have been initially cleaning with alcohol although that has been discontinued when he went to the ER on November 14. They actually recommended cleaning with just soap and water. There is really not been any specific dressings that were recommended at this time. Subsequently the patient does have an ABI of 1.3 here in the clinic today. This is on the right. Subsequently also has a recent hemoglobin A1c from when he was at the ER of 6.3. He really is not having any significant pain which is good news he does have some swelling which is going to need to  be addressed as well. This is minimal however I do not think he has to have a  compression wrap I think Tubigrip would probably be appropriate. Patient does have a history of diabetes mellitus type 2, congestive heart failure, atrial fibrillation, and hypertension. 01/01/2021 upon evaluation today patient appears to be doing pretty well in regard to his wound. Fortunately I do not see any signs of infection and in 1 week's time I feel like we have made some good progress here. No fevers, chills, nausea, vomiting, or diarrhea. 01/08/2021 upon evaluation today patient's wound at this point is showing signs of doing really about the same. I am not seeing as much improvement as I would like to see. I think at this point that I would recommend we see about actually using a different medication I am thinking Iodosorb could be beneficial for him. He is in agreement with given something else to try here. 12/27; use Iodoflex starting last week. Wound surface looks better but no major change in dimensions 01/24/2021 upon evaluation today patient appears to be doing well with regard to his leg ulcer. I am actually seeing signs of significant improvement and very pleased in this regard. I do not see any evidence of active infection which is great news. No fevers, chills, nausea, vomiting, or diarrhea. 01/29/2021 upon evaluation today patient appears to be doing well with regard to his leg ulcer. This is not significantly smaller but is also not any larger I do think at this point switching over to the silver collagen to see if we get this to speed up would be a good option. We have been using the Iodoflex which I think has been helpful but nonetheless I think we might be able to do something to speed this up a bit at this point. 02/04/21; right lateral calf which was initially a dog bite. We have been using silver collagen covered in Tubigrip changing 3 times a week. They are moistening this with saline but  apparently the collagen is staying moist. Dimensions down by half a centimeter 02/11/2021 upon evaluation today patient's wound on the leg actually appears to be doing quite well. I am actually very pleased with where things stand today. I do not see any evidence of active infection locally nor systemically which is great news. Overall I feel like that we are making excellent progress and I am extremely pleased with where we stand at this point. He does have some irritation from the tape we can work on keeping tape off of the skin going forward. 02/18/2021 upon evaluation today patient appears to be doing well with regard to his wound. He has been tolerating the dressing changes without complication. Fortunately I do not see any signs of active infection locally nor systemically at this time which is great news. No fevers, chills, nausea, vomiting, or diarrhea. 02/26/2021 upon evaluation today patient appears to be doing well with regard to his original wound at this in fact is showing signs of excellent improvement which is great news. Overall I am extremely pleased with what I see today. He does have however a small area more proximal that has been somewhat for open. This was due to aggressive cleaning when his wife was changing the dressings. She was try to get some of the stuck on collagen off. Nonetheless I think this is just a very minor setback and something that we get under control fairly quickly for him here. 03/04/2021 upon evaluation today patient appears to be doing well with regard to his wound. Fortunately this is showing signs of measuring smaller. There still is an  open wound however at this time. I think that switching to Xeroform and probably do quite well for him currently. 03/11/2021 upon evaluation today patient appears to be doing well with regard to his wound. In fact this appears to be completely healed which is great news. Fortunately I do not see any signs of active infection  locally nor systemically at this point. Billy Franco, Billy Franco ([id number]) Objective Constitutional Well-nourished and well-hydrated in no acute distress. Vitals Time Taken: 10:15 AM, Height: 67 in, Weight: 150 lbs, BMI: 23.5, Temperature: 97.7 F, Pulse: 76 bpm, Respiratory Rate: 18 breaths/min, Blood Pressure: 121/65 mmHg. Respiratory normal breathing without difficulty. Psychiatric this patient is able to make decisions and demonstrates good insight into disease process. Alert and Oriented x 3. pleasant and cooperative. General Notes: Upon inspection patient's wound bed showed signs of good Epithelization at this point. Fortunately I do not see any evidence of anything being actually open Which is great news. Integumentary (Hair, Skin) Wound #1 status is Open. Original cause of wound was Bite. The date acquired was: 11/16/2020. The wound has been in treatment 11 weeks. The wound is located on the Right,Lateral Lower Leg. The wound measures 0cm length x 0cm width x 0cm depth; 0cm^2 area and 0cm^3 volume. There is no tunneling or undermining noted. There is a none present amount of drainage noted. There is no granulation within the wound bed. There is no necrotic tissue within the wound bed. Assessment Active Problems ICD-10 Bitten by dog, initial encounter Non-pressure chronic ulcer of other part of right lower leg with fat layer exposed Type 2 diabetes mellitus with other skin ulcer Chronic combined systolic (congestive) and diastolic (congestive) heart failure Paroxysmal atrial fibrillation Essential (primary) hypertension Plan Discharge From [facility] Services: Discharge from [facility] 1. Would recommend currently that we go ahead and continue with the wound care measures as before as far as compression is concerned I think compression socks would be ideal for him. 2. I am going to recommend that we discontinue wound care services as the patient appears to be  completely healed based on what I am seeing currently. We will see him back for follow-up visit as needed. Electronic Signature(s) Signed: 03/11/2021 10:52:54 AM By: Worthy Keeler PA-C Entered By: Worthy Keeler on 03/11/2021 10:52:53 Billy Franco ([id number]) -------------------------------------------------------------------------------- SuperBill Details Patient Name: Billy Franco Date of Service: 03/11/2021 Medical Record Number: [id number] Patient Account Number: [id number] Date of Birth/Sex: [birth year] (80 y.o. M) Treating RN: Carlene Coria Primary Care Provider: Steele Sizer Other Clinician: Referring Provider: Steele Sizer Treating Provider/Extender: Skipper Cliche in Treatment: 11 Diagnosis Coding ICD-10 Codes Code Description W54.0XXA Bitten by dog, initial encounter [phone] Non-pressure chronic ulcer of other part of right lower leg with fat layer exposed E11.622 Type 2 diabetes mellitus with other skin ulcer I50.42 Chronic combined systolic (congestive) and diastolic (congestive) heart failure I48.0 Paroxysmal atrial fibrillation I10 Essential (primary) hypertension Facility Procedures CPT4 Code: [id number] Description: [phone] - WOUND CARE VISIT-LEV 2 EST PT Modifier: Quantity: 1 Physician Procedures CPT4 Code: [id number] Description: 54650 - WC PHYS LEVEL 3 - EST PT Modifier: Quantity: 1 CPT4 Code: Description: ICD-10 Diagnosis Description W54.0XXA Bitten by dog, initial encounter [phone] Non-pressure chronic ulcer of other part of right lower leg with fat la E11.622 Type 2 diabetes mellitus with other skin ulcer I50.42 Chronic combined systolic  (congestive) and diastolic (congestive) heart Modifier: yer exposed failure Quantity: Electronic Signature(s) Signed: 03/11/2021 10:53:20 AM By: Worthy Keeler PA-C Entered By: Melburn Hake,  Davionne Dowty on 03/11/2021 10:53:20

## 2021-03-12 NOTE — Progress Notes (Signed)
JAGAR, LUA ([id number]) Visit Report for 03/11/2021 Arrival Information Details Patient Name: Billy Franco, Billy Franco Date of Service: 03/11/2021 10:15 AM Medical Record Number: [id number] Patient Account Number: [id number] Date of Birth/Sex: 18-Jul-1940 (81 y.o. M) Treating RN: Carlene Coria Primary Care Lathon Adan: Steele Sizer Other Clinician: Referring Zyia Kaneko: Steele Sizer Treating Danajah Birdsell/Extender: Skipper Cliche in Treatment: 11 Visit Information History Since Last Visit All ordered tests and consults were completed: No Patient Arrived: Ambulatory Added or deleted any medications: No Arrival Time: 10:10 Any new allergies or adverse reactions: No Accompanied By: wife Had a fall or experienced change in No Transfer Assistance: None activities of daily living that may affect Patient Identification Verified: Yes risk of falls: Secondary Verification Process Completed: Yes Signs or symptoms of abuse/neglect since last visito No Patient Requires Transmission-Based No Hospitalized since last visit: No Precautions: Implantable device outside of the clinic excluding No Patient Has Alerts: Yes cellular tissue based products placed in the center Patient Alerts: Patient on Blood since last visit: Thinner Has Dressing in Place as Prescribed: Yes Has Compression in Place as Prescribed: Yes Pain Present Now: No Electronic Signature(s) Signed: 03/12/2021 7:54:25 AM By: Carlene Coria RN Entered By: Carlene Coria on 03/11/2021 10:15:07 Billy Franco ([id number]) -------------------------------------------------------------------------------- Clinic Level of Care Assessment Details Patient Name: Billy Franco Date of Service: 03/11/2021 10:15 AM Medical Record Number: [id number] Patient Account Number: [id number] Date of Birth/Sex: 1940/03/06 (80 y.o. M) Treating RN: Carlene Coria Primary Care Alaysiah Browder: Steele Sizer Other Clinician: Referring Keelin Sheridan: Steele Sizer Treating  Ardell Makarewicz/Extender: Skipper Cliche in Treatment: 11 Clinic Level of Care Assessment Items TOOL 4 Quantity Score X - Use when only an EandM is performed on FOLLOW-UP visit 1 0 ASSESSMENTS - Nursing Assessment / Reassessment X - Reassessment of Co-morbidities (includes updates in patient status) 1 10 X- 1 5 Reassessment of Adherence to Treatment Plan ASSESSMENTS - Wound and Skin Assessment / Reassessment X - Simple Wound Assessment / Reassessment - one wound 1 5 []  - 0 Complex Wound Assessment / Reassessment - multiple wounds []  - 0 Dermatologic / Skin Assessment (not related to wound area) ASSESSMENTS - Focused Assessment []  - Circumferential Edema Measurements - multi extremities 0 []  - 0 Nutritional Assessment / Counseling / Intervention []  - 0 Lower Extremity Assessment (monofilament, tuning fork, pulses) []  - 0 Peripheral Arterial Disease Assessment (using hand held doppler) ASSESSMENTS - Ostomy and/or Continence Assessment and Care []  - Incontinence Assessment and Management 0 []  - 0 Ostomy Care Assessment and Management (repouching, etc.) PROCESS - Coordination of Care X - Simple Patient / Family Education for ongoing care 1 15 []  - 0 Complex (extensive) Patient / Family Education for ongoing care []  - 0 Staff obtains Programmer, systems, Records, Test Results / Process Orders []  - 0 Staff telephones HHA, Nursing Homes / Clarify orders / etc []  - 0 Routine Transfer to another Facility (non-emergent condition) []  - 0 Routine Hospital Admission (non-emergent condition) []  - 0 New Admissions / Biomedical engineer / Ordering NPWT, Apligraf, etc. []  - 0 Emergency Hospital Admission (emergent condition) X- 1 10 Simple Discharge Coordination []  - 0 Complex (extensive) Discharge Coordination PROCESS - Special Needs []  - Pediatric / Minor Patient Management 0 []  - 0 Isolation Patient Management []  - 0 Hearing / Language / Visual special needs []  - 0 Assessment of  Community assistance (transportation, D/C planning, etc.) []  - 0 Additional assistance / Altered mentation []  - 0 Support Surface(s) Assessment (bed, cushion, seat, etc.) INTERVENTIONS - Wound Cleansing / Measurement Bernick, Darcel (  [id number]) X- 1 5 Simple Wound Cleansing - one wound []  - 0 Complex Wound Cleansing - multiple wounds X- 1 5 Wound Imaging (photographs - any number of wounds) []  - 0 Wound Tracing (instead of photographs) X- 1 5 Simple Wound Measurement - one wound []  - 0 Complex Wound Measurement - multiple wounds INTERVENTIONS - Wound Dressings []  - Small Wound Dressing one or multiple wounds 0 []  - 0 Medium Wound Dressing one or multiple wounds []  - 0 Large Wound Dressing one or multiple wounds []  - 0 Application of Medications - topical []  - 0 Application of Medications - injection INTERVENTIONS - Miscellaneous []  - External ear exam 0 []  - 0 Specimen Collection (cultures, biopsies, blood, body fluids, etc.) []  - 0 Specimen(s) / Culture(s) sent or taken to Lab for analysis []  - 0 Patient Transfer (multiple staff / Civil Service fast streamer / Similar devices) []  - 0 Simple Staple / Suture removal (25 or less) []  - 0 Complex Staple / Suture removal (26 or more) []  - 0 Hypo / Hyperglycemic Management (close monitor of Blood Glucose) []  - 0 Ankle / Brachial Index (ABI) - do not check if billed separately X- 1 5 Vital Signs Has the patient been seen at the hospital within the last three years: Yes Total Score: 65 Level Of Care: New/Established - Level 2 Electronic Signature(s) Signed: 03/12/2021 7:54:25 AM By: Carlene Coria RN Entered By: Carlene Coria on 03/11/2021 10:34:13 Billy Franco ([id number]) -------------------------------------------------------------------------------- Encounter Discharge Information Details Patient Name: Billy Franco Date of Service: 03/11/2021 10:15 AM Medical Record Number: [id number] Patient Account Number: [id number] Date of  Birth/Sex: Jan 02, 1941 (80 y.o. M) Treating RN: Carlene Coria Primary Care Giuliano Preece: Steele Sizer Other Clinician: Referring Brigitte Soderberg: Steele Sizer Treating Breck Hollinger/Extender: Skipper Cliche in Treatment: 11 Encounter Discharge Information Items Discharge Condition: Stable Ambulatory Status: Ambulatory Discharge Destination: Home Transportation: Private Auto Accompanied By: wife Schedule Follow-up Appointment: Yes Clinical Summary of Care: Patient Declined Electronic Signature(s) Signed: 03/12/2021 7:54:25 AM By: Carlene Coria RN Entered By: Carlene Coria on 03/11/2021 10:35:09 Billy Franco ([id number]) -------------------------------------------------------------------------------- Lower Extremity Assessment Details Patient Name: Billy Franco Date of Service: 03/11/2021 10:15 AM Medical Record Number: [id number] Patient Account Number: [id number] Date of Birth/Sex: 1941/01/18 (80 y.o. M) Treating RN: Carlene Coria Primary Care Evelena Masci: Steele Sizer Other Clinician: Referring Quamere Mussell: Steele Sizer Treating Aleesia Henney/Extender: Skipper Cliche in Treatment: 11 Edema Assessment Assessed: [Left: No] [Right: No] Edema: [Left: N] [Right: o] Calf Left: Right: Point of Measurement: 36 cm From Medial Instep 32.3 cm Ankle Left: Right: Point of Measurement: 10 cm From Medial Instep 21.6 cm Vascular Assessment Pulses: Dorsalis Pedis Palpable: [Right:Yes] Electronic Signature(s) Signed: 03/12/2021 7:54:25 AM By: Carlene Coria RN Entered By: Carlene Coria on 03/11/2021 10:19:13 Billy Franco ([id number]) -------------------------------------------------------------------------------- Multi Wound Chart Details Patient Name: Billy Franco Date of Service: 03/11/2021 10:15 AM Medical Record Number: [id number] Patient Account Number: [id number] Date of Birth/Sex: 1940-11-15 (80 y.o. M) Treating RN: Carlene Coria Primary Care Koralee Wedeking: Steele Sizer Other  Clinician: Referring Kemyra August: Steele Sizer Treating Tyrelle Raczka/Extender: Skipper Cliche in Treatment: 11 Vital Signs Height(in): 67 Pulse(bpm): 26 Weight(lbs): 150 Blood Pressure(mmHg): 121/65 Body Mass Index(BMI): 23.5 Temperature(F): 97.7 Respiratory Rate(breaths/min): 18 Photos: [N/A:N/A] Wound Location: Right, Lateral Lower Leg N/A N/A Wounding Event: Bite N/A N/A Primary Etiology: Diabetic Wound/Ulcer of the Lower N/A N/A Extremity Comorbid History: Congestive Heart Failure, N/A N/A Hypertension, Type II Diabetes Date Acquired: 11/16/2020 N/A N/A Weeks of Treatment: 11 N/A N/A Wound Status: Open N/A N/A Wound Recurrence: No  N/A N/A Measurements L x W x D (cm) 0x0x0 N/A N/A Area (cm) : 0 N/A N/A Volume (cm) : 0 N/A N/A % Reduction in Area: 100.00% N/A N/A % Reduction in Volume: 100.00% N/A N/A Classification: Grade 2 N/A N/A Exudate Amount: None Present N/A N/A Granulation Amount: None Present (0%) N/A N/A Necrotic Amount: None Present (0%) N/A N/A Exposed Structures: Fascia: No N/A N/A Fat Layer (Subcutaneous Tissue): No Tendon: No Muscle: No Joint: No Bone: No Epithelialization: Large (67-100%) N/A N/A Treatment Notes Electronic Signature(s) Signed: 03/12/2021 7:54:25 AM By: Carlene Coria RN Entered By: Carlene Coria on 03/11/2021 10:31:33 Billy Franco ([id number]) -------------------------------------------------------------------------------- Multi-Disciplinary Care Plan Details Patient Name: Billy Franco Date of Service: 03/11/2021 10:15 AM Medical Record Number: [id number] Patient Account Number: [id number] Date of Birth/Sex: Apr 30, 1940 (80 y.o. M) Treating RN: Carlene Coria Primary Care Shelena Castelluccio: Steele Sizer Other Clinician: Referring Princes Finger: Steele Sizer Treating Noella Kipnis/Extender: Skipper Cliche in Treatment: 11 Active Inactive Electronic Signature(s) Signed: 03/12/2021 7:54:25 AM By: Carlene Coria RN Entered By: Carlene Coria  on 03/11/2021 10:31:20 Billy Franco ([id number]) -------------------------------------------------------------------------------- Pain Assessment Details Patient Name: Billy Franco Date of Service: 03/11/2021 10:15 AM Medical Record Number: [id number] Patient Account Number: [id number] Date of Birth/Sex: 04/01/1940 (80 y.o. M) Treating RN: Carlene Coria Primary Care Kanye Depree: Steele Sizer Other Clinician: Referring Takeo Harts: Steele Sizer Treating Harpreet Pompey/Extender: Skipper Cliche in Treatment: 11 Active Problems Location of Pain Severity and Description of Pain Patient Has Paino No Site Locations Pain Management and Medication Current Pain Management: Electronic Signature(s) Signed: 03/12/2021 7:54:25 AM By: Carlene Coria RN Entered By: Carlene Coria on 03/11/2021 10:15:31 Billy Franco ([id number]) -------------------------------------------------------------------------------- Patient/Caregiver Education Details Patient Name: Billy Franco Date of Service: 03/11/2021 10:15 AM Medical Record Number: [id number] Patient Account Number: [id number] Date of Birth/Gender: [birth year] (80 y.o. M) Treating RN: Carlene Coria Primary Care Physician: Steele Sizer Other Clinician: Referring Physician: Steele Sizer Treating Physician/Extender: Skipper Cliche in Treatment: 11 Education Assessment Education Provided To: Patient Education Topics Provided Wound/Skin Impairment: Methods: Explain/Verbal Responses: State content correctly Electronic Signature(s) Signed: 03/12/2021 7:54:25 AM By: Carlene Coria RN Entered By: Carlene Coria on 03/11/2021 10:34:31 Billy Franco ([id number]) -------------------------------------------------------------------------------- Wound Assessment Details Patient Name: Billy Franco Date of Service: 03/11/2021 10:15 AM Medical Record Number: [id number] Patient Account Number: [id number] Date of Birth/Sex: [birth year] (80 y.o. M) Treating  RN: Carlene Coria Primary Care Anique Beckley: Steele Sizer Other Clinician: Referring Darilyn Storbeck: Steele Sizer Treating Siani Utke/Extender: Skipper Cliche in Treatment: 11 Wound Status Wound Number: 1 Primary Etiology: Diabetic Wound/Ulcer of the Lower Extremity Wound Location: Right, Lateral Lower Leg Wound Status: Open Wounding Event: Bite Comorbid Congestive Heart Failure, Hypertension, Type II History: Diabetes Date Acquired: 11/16/2020 Weeks Of Treatment: 11 Clustered Wound: No Photos Wound Measurements Length: (cm) 0 Width: (cm) 0 Depth: (cm) 0 Area: (cm) 0 Volume: (cm) 0 % Reduction in Area: 100% % Reduction in Volume: 100% Epithelialization: Large (67-100%) Tunneling: No Undermining: No Wound Description Classification: Grade 2 Exudate Amount: None Present Foul Odor After Cleansing: No Slough/Fibrino No Wound Bed Granulation Amount: None Present (0%) Exposed Structure Necrotic Amount: None Present (0%) Fascia Exposed: No Fat Layer (Subcutaneous Tissue) Exposed: No Tendon Exposed: No Muscle Exposed: No Joint Exposed: No Bone Exposed: No Electronic Signature(s) Signed: 03/12/2021 7:54:25 AM By: Carlene Coria RN Entered By: Carlene Coria on 03/11/2021 10:18:41 Billy Franco ([id number]) -------------------------------------------------------------------------------- [place] Details Patient Name: Billy Franco Date of Service: 03/11/2021 10:15 AM Medical Record Number: [id number] Patient Account Number: [id number] Date of Birth/Sex: 1940/03/06 (80 y.o. M)  Treating RN: Carlene Coria Primary Care Donise Woodle: Steele Sizer Other Clinician: Referring Caryl Manas: Steele Sizer Treating Branon Sabine/Extender: Skipper Cliche in Treatment: 11 Vital Signs Time Taken: 10:15 Temperature (F): 97.7 Height (in): 67 Pulse (bpm): 76 Weight (lbs): 150 Respiratory Rate (breaths/min): 18 Body Mass Index (BMI): 23.5 Blood Pressure (mmHg): 121/65 Reference Range: 80 -  120 mg / dl Electronic Signature(s) Signed: 03/12/2021 7:54:25 AM By: Carlene Coria RN Entered By: Carlene Coria on 03/11/2021 10:15:23

## 2021-03-18 ENCOUNTER — Encounter: Payer: Medicare Other | Admitting: Physician Assistant

## 2021-03-20 DIAGNOSIS — E119 Type 2 diabetes mellitus without complications: Secondary | ICD-10-CM | POA: Diagnosis not present

## 2021-03-20 LAB — HM DIABETES EYE EXAM

## 2021-03-25 ENCOUNTER — Other Ambulatory Visit: Payer: Self-pay

## 2021-03-25 ENCOUNTER — Encounter: Payer: Medicare Other | Admitting: Physician Assistant

## 2021-03-26 ENCOUNTER — Telehealth: Payer: Self-pay | Admitting: Family Medicine

## 2021-03-26 ENCOUNTER — Other Ambulatory Visit: Payer: Self-pay | Admitting: Family Medicine

## 2021-03-26 DIAGNOSIS — E039 Hypothyroidism, unspecified: Secondary | ICD-10-CM

## 2021-03-26 MED ORDER — LEVOTHYROXINE SODIUM 25 MCG PO TABS: 25.0000 ug | ORAL_TABLET | Freq: Every day | ORAL | 0 refills | Status: DC

## 2021-03-26 NOTE — Telephone Encounter (Signed)
[facility] called and spoke to [place], [facility] about the refill(s) Levothyroxine requested. Advised it was sent on 12/31/20 #96/1 refill(s). She says there is a note from the caregiver that says the patient takes 1 tab daily and 2 tabs one day a week, so in order to bill the insurance the instructions will need to say that. Advised I will send this to the provider for review. ? ?

## 2021-03-26 NOTE — Telephone Encounter (Signed)
Pt is all out of this, is requesting shosrt supply.Medication Refill - Medication:levothyroxine (SYNTHROID) 25 MCG tablet  ? ?Has the patient contacted their pharmacy? yes ?(Agent: If no, request that the patient contact the pharmacy for the refill. If patient does not wish to contact the pharmacy document the reason why and proceed with request.) ?(Agent: If yes, when and what did the pharmacy advise?)contact pcp ? ?Preferred Pharmacy (with phone number or street name ?[id number] [place] - [address] Phone:  [phone] ?Fax:  [phone] ?  ? ?Has the patient been seen for an appointment in the last year OR does the patient have an upcoming appointment? yes ? ?Agent: Please be advised that RX refills may take up to 3 business days. We ask that you follow-up with your pharmacy.  ?

## 2021-04-01 ENCOUNTER — Encounter: Payer: Medicare Other | Admitting: Physician Assistant

## 2021-04-01 DIAGNOSIS — M791 Myalgia, unspecified site: Secondary | ICD-10-CM | POA: Diagnosis not present

## 2021-04-08 ENCOUNTER — Encounter: Payer: Medicare Other | Admitting: Physician Assistant

## 2021-04-12 ENCOUNTER — Emergency Department: Payer: Medicare Other

## 2021-04-12 ENCOUNTER — Other Ambulatory Visit: Payer: Self-pay

## 2021-04-12 ENCOUNTER — Emergency Department
Admission: EM | Admit: 2021-04-12 | Discharge: 2021-04-12 | Disposition: A | Discharge status: Home / Self Care | Payer: Medicare Other | Attending: Emergency Medicine | Admitting: Emergency Medicine

## 2021-04-12 DIAGNOSIS — M79605 Pain in left leg: Secondary | ICD-10-CM | POA: Diagnosis not present

## 2021-04-12 DIAGNOSIS — I509 Heart failure, unspecified: Secondary | ICD-10-CM | POA: Diagnosis not present

## 2021-04-12 DIAGNOSIS — E1122 Type 2 diabetes mellitus with diabetic chronic kidney disease: Secondary | ICD-10-CM | POA: Insufficient documentation

## 2021-04-12 DIAGNOSIS — N183 Chronic kidney disease, stage 3 unspecified: Secondary | ICD-10-CM | POA: Insufficient documentation

## 2021-04-12 DIAGNOSIS — I130 Hypertensive heart and chronic kidney disease with heart failure and stage 1 through stage 4 chronic kidney disease, or unspecified chronic kidney disease: Secondary | ICD-10-CM | POA: Diagnosis not present

## 2021-04-12 DIAGNOSIS — M79662 Pain in left lower leg: Secondary | ICD-10-CM | POA: Diagnosis not present

## 2021-04-12 LAB — CBC WITH DIFFERENTIAL/PLATELET
Abs Immature Granulocytes: 0.02 10*3/uL (ref 0.00–0.07)
Basophils Absolute: 0.1 10*3/uL (ref 0.0–0.1)
Basophils Relative: 1 %
Eosinophils Absolute: 0.3 10*3/uL (ref 0.0–0.5)
Eosinophils Relative: 4 %
HCT: 35.9 % — ABNORMAL LOW (ref 39.0–52.0)
Hemoglobin: 11.6 g/dL — ABNORMAL LOW (ref 13.0–17.0)
Immature Granulocytes: 0 %
Lymphocytes Relative: 18 %
Lymphs Abs: 1.2 10*3/uL (ref 0.7–4.0)
MCH: 32 pg (ref 26.0–34.0)
MCHC: 32.3 g/dL (ref 30.0–36.0)
MCV: 98.9 fL (ref 80.0–100.0)
Monocytes Absolute: 0.7 10*3/uL (ref 0.1–1.0)
Monocytes Relative: 12 %
Neutro Abs: 4.2 10*3/uL (ref 1.7–7.7)
Neutrophils Relative %: 65 %
Platelets: 191 10*3/uL (ref 150–400)
RBC: 3.63 MIL/uL — ABNORMAL LOW (ref 4.22–5.81)
RDW: 13 % (ref 11.5–15.5)
WBC: 6.4 10*3/uL (ref 4.0–10.5)
nRBC: 0 % (ref 0.0–0.2)

## 2021-04-12 LAB — COMPREHENSIVE METABOLIC PANEL
ALT: 26 U/L (ref 0–44)
AST: 27 U/L (ref 15–41)
Albumin: 4 g/dL (ref 3.5–5.0)
Alkaline Phosphatase: 55 U/L (ref 38–126)
Anion gap: 10 (ref 5–15)
BUN: 28 mg/dL — ABNORMAL HIGH (ref 8–23)
CO2: 26 mmol/L (ref 22–32)
Calcium: 9.5 mg/dL (ref 8.9–10.3)
Chloride: 102 mmol/L (ref 98–111)
Creatinine, Ser: 1.24 mg/dL (ref 0.61–1.24)
GFR, Estimated: 59 mL/min — ABNORMAL LOW (ref >60)
Glucose, Bld: 157 mg/dL — ABNORMAL HIGH (ref 70–99)
Potassium: 4.5 mmol/L (ref 3.5–5.1)
Sodium: 138 mmol/L (ref 135–145)
Total Bilirubin: 1.1 mg/dL (ref 0.3–1.2)
Total Protein: 7.2 g/dL (ref 6.5–8.1)

## 2021-04-12 MED ORDER — CYCLOBENZAPRINE HCL 10 MG PO TABS: 10.0000 mg | ORAL_TABLET | Freq: Three times a day (TID) | ORAL | 0 refills | Status: AC | PRN

## 2021-04-12 MED ORDER — HYDROCODONE-ACETAMINOPHEN 5-325 MG PO TABS
1.0000 | ORAL_TABLET | Freq: Once | ORAL | Status: AC
  Administered 2021-04-12: 1 via ORAL
  Filled 2021-04-12: qty 1

## 2021-04-12 NOTE — ED Notes (Signed)
Pt states he has been having problems with his left leg for a while but went to the doctor 2 weeks ago and was given prednisone. Pt took full dose but when he went to stand today he was unable to stand on his left leg. Pt denies any injury. ? ?

## 2021-04-12 NOTE — ED Triage Notes (Signed)
Patient to ER via PO with muscle pain in his right thigh. Reports he was seen on 3/13 at [facility] clinic after having a muscle cramp on 3/10. Reports he was given prednisone for 10 days with some improvement. Patient reports leg is always cramping and painful and has difficulty ambulating due to the pain.  ? ?Wife has been applying heat, using tylenol, rubbing alcohol on the leg with no relief in symptoms. ?

## 2021-04-12 NOTE — ED Notes (Signed)
Computer in room not working. Pt gave verbal consent for DC ?

## 2021-04-12 NOTE — Discharge Instructions (Addendum)
-  Continue to treat pain with Tylenol/ibuprofen as needed.  Continue rest, heating pads, massage frequently. ?-Take the cyclobenzaprine as prescribed.  Do not drive immediately after taking this medication. ?-Follow-up with the orthopedist listed above as needed. ?-Return to the emergency department at any time if you begin to experience any new or worsening symptoms. ?

## 2021-04-12 NOTE — ED Provider Notes (Signed)
? ?[facility] ?Provider Note ? ? ? Event Date/Time  ? First MD Initiated Contact with Patient 04/12/21 1526   ?  (approximate) ? ? ?History  ? ?Chief Complaint ?Leg Pain ? ? ?HPI ?Billy Franco is a 81 y.o. male, history of hypertension, dyslipidemia, type 2 diabetes, A-fib, CHF, stage III CKD, nephrolithiasis, presents to the emergency department for evaluation of leg pain.  Patient states that he has been experiencing pain along his upper left calf.  Patient states that this been going on since 03/10.  Reports significant cramping with increase in pain while walking.  He was seen on 04/01/2021 external clinic where they treated him for potential muscle cramps with a prescription for prednisone.  Patient states that he has been taking the prednisone, using Tylenol, applying heat, with minimal relief.  Denies chest pain, shortness of breath, abdominal pain, back pain, rashes/lesions, numbness/tingling in lower extremities, urinary symptoms, or lightheadedness/dizziness. ? ?History Limitations: No limitations. ? ?  ? ? ?Physical Exam  ?Triage Vital Signs: ?ED Triage Vitals  ?Enc Vitals Group  ?   BP 04/12/21 1456 (!) 104/45  ?   Pulse Rate 04/12/21 1456 64  ?   Resp 04/12/21 1456 17  ?   Temp 04/12/21 1456 98 ?F (36.7 ?C)  ?   Temp Source 04/12/21 1456 Oral  ?   SpO2 04/12/21 1456 100 %  ?   Weight --   ?   Height 04/12/21 1457 '5\' 7"'$  (1.702 m)  ?   Head Circumference --   ?   Peak Flow --   ?   Pain Score 04/12/21 1457 9  ?   Pain Loc --   ?   Pain Edu? --   ?   Excl. in [place]? --   ? ? ?Most recent vital signs: ?Vitals:  ? 04/12/21 1456  ?BP: (!) 104/45  ?Pulse: 64  ?Resp: 17  ?Temp: 98 ?F (36.7 ?C)  ?SpO2: 100%  ? ? ?General: Awake, NAD.  ?Skin: Warm, dry.  ?CV: Good peripheral perfusion.  ?Resp: Normal effort.  Lung sounds clear bilaterally in the apices and bases. ?Abd: Soft, non-tender. No distention.  ?Neuro: At baseline. No gross neurological deficits.  ?Other: The left lower extremity  does feel notably tight in the superior calf region.  No bony tenderness.  No remarkable swelling or erythema.  Normal range of motion.  Pulse, motor, sensation intact distally.  Patient still able to ambulate on his own. ? ?Physical Exam ? ? ? ?ED Results / Procedures / Treatments  ?Labs ?(all labs ordered are listed, but only abnormal results are displayed) ?Labs Reviewed  ?COMPREHENSIVE METABOLIC PANEL - Abnormal; Notable for the following components:  ?    Result Value  ? Glucose, Bld 157 (*)   ? BUN 28 (*)   ? GFR, Estimated 59 (*)   ? All other components within normal limits  ?CBC WITH DIFFERENTIAL/PLATELET - Abnormal; Notable for the following components:  ? RBC 3.63 (*)   ? Hemoglobin 11.6 (*)   ? HCT 35.9 (*)   ? All other components within normal limits  ? ? ? ?EKG ?Not applicable. ? ? ?RADIOLOGY ? ?ED Provider Interpretation: I personally reviewed and interpreted these images.  X-ray shows no evidence of acute fracture.  Ultrasound shows no evidence of DVT. ? ?DG Tibia/Fibula Left ? ?Result Date: 04/12/2021 ?CLINICAL DATA:  Left lower leg pain. EXAM: LEFT TIBIA AND FIBULA - 2 VIEW COMPARISON:  None. FINDINGS: There  is no evidence of fracture or other focal bone lesions. Soft tissues are unremarkable. IMPRESSION: Negative. Electronically Signed   By: Titus Dubin M.D.   On: 04/12/2021 16:04  ? ?US Venous Img Lower  Left (DVT Study) ? ?Result Date: 04/12/2021 ?CLINICAL DATA:  Pain EXAM: Left LOWER EXTREMITY VENOUS DOPPLER ULTRASOUND TECHNIQUE: Gray-scale sonography with compression, as well as color and duplex ultrasound, were performed to evaluate the deep venous system(s) from the level of the common femoral vein through the popliteal and proximal calf veins. COMPARISON:  None. FINDINGS: VENOUS Normal compressibility of the common femoral, superficial femoral, and popliteal veins, as well as the visualized calf veins. Visualized portions of profunda femoral vein and great saphenous vein unremarkable. No  filling defects to suggest DVT on grayscale or color Doppler imaging. Doppler waveforms show normal direction of venous flow, normal respiratory plasticity and response to augmentation. Limited views of the contralateral common femoral vein are unremarkable. OTHER None. Limitations: none IMPRESSION: There is no evidence of deep venous thrombosis in the left lower extremity. Electronically Signed   By: Elmer Picker M.D.   On: 04/12/2021 17:30   ? ?PROCEDURES: ? ?Critical Care performed: None. ? ?Procedures ? ? ? ?MEDICATIONS ORDERED IN ED: ?Medications  ?HYDROcodone-acetaminophen (NORCO/VICODIN) 5-325 MG per tablet 1 tablet (has no administration in time range)  ? ? ? ?IMPRESSION / MDM / ASSESSMENT AND PLAN / ED COURSE  ?I reviewed the triage vital signs and the nursing notes. ?             ?               ? ?Differential diagnosis includes, but is not limited to, DVT, cellulitis, gastrocnemius strain/tear, tibia/fibula fracture. ? ?ED Course ?Patient appears well.  Vital signs within normal limits.  NAD.  Given patient's endorsement of pain, we will go ahead treat with hydrocodone/acetaminophen. ? ?CBC shows no leukocytosis or clinically significant anemia. ? ?CMP shows mildly elevated glucose at 157, otherwise no electrolyte abnormalities or transaminitis. ? ?Tibia/fibula x-ray negative.   ? ?No evidence of DVT on ultrasound. ? ?Assessment/Plan ?Presentation consistent with gastrocnemius strain.  X-ray and ultrasound reassuring for no evidence of acute process.  We will plan to discharge this patient with a prescription for cyclobenzaprine, though given his age I did warn him about potential side effects.  Advised him not to operate any machinery or drive while being on this medication.  Patient expressed understanding and agreed with the plan.  We will additionally provide him a referral to orthopedics if his pain fails to improve. ? ?Patient was provided with anticipatory guidance, return precautions, and  educational material. Encouraged the patient to return to the emergency department at any time if they begin to experience any new or worsening symptoms.  ? ?  ? ? ?FINAL CLINICAL IMPRESSION(S) / ED DIAGNOSES  ? ?Final diagnoses:  ?Left leg pain  ? ? ? ?Rx / DC Orders  ? ?ED Discharge Orders   ? ?      Ordered  ?  cyclobenzaprine (FLEXERIL) 10 MG tablet  3 times daily PRN       ? 04/12/21 1752  ? ?  ?  ? ?  ? ? ? ?Note:  This document was prepared using Dragon voice recognition software and may include unintentional dictation errors. ?  ?Teodoro Spray, [place] ?04/12/21 1753 ? ?  ?Merlyn Lot, MD ?04/12/21 2126 ? ?

## 2021-04-15 ENCOUNTER — Encounter: Payer: Medicare Other | Admitting: Physician Assistant

## 2021-04-15 DIAGNOSIS — U071 COVID-19: Secondary | ICD-10-CM | POA: Diagnosis not present

## 2021-04-15 DIAGNOSIS — I480 Paroxysmal atrial fibrillation: Secondary | ICD-10-CM | POA: Diagnosis not present

## 2021-04-15 DIAGNOSIS — I429 Cardiomyopathy, unspecified: Secondary | ICD-10-CM | POA: Diagnosis not present

## 2021-04-15 DIAGNOSIS — R0609 Other forms of dyspnea: Secondary | ICD-10-CM | POA: Diagnosis not present

## 2021-04-15 DIAGNOSIS — I5020 Unspecified systolic (congestive) heart failure: Secondary | ICD-10-CM | POA: Diagnosis not present

## 2021-04-16 DIAGNOSIS — L905 Scar conditions and fibrosis of skin: Secondary | ICD-10-CM | POA: Diagnosis not present

## 2021-04-16 DIAGNOSIS — C44629 Squamous cell carcinoma of skin of left upper limb, including shoulder: Secondary | ICD-10-CM | POA: Diagnosis not present

## 2021-04-22 DIAGNOSIS — H2511 Age-related nuclear cataract, right eye: Secondary | ICD-10-CM | POA: Diagnosis not present

## 2021-04-30 DIAGNOSIS — D0462 Carcinoma in situ of skin of left upper limb, including shoulder: Secondary | ICD-10-CM | POA: Diagnosis not present

## 2021-05-01 ENCOUNTER — Encounter: Payer: Self-pay | Admitting: Ophthalmology

## 2021-05-03 NOTE — Discharge Instructions (Signed)
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
## 2021-05-06 ENCOUNTER — Encounter: Payer: Self-pay | Admitting: Family Medicine

## 2021-05-06 ENCOUNTER — Ambulatory Visit (INDEPENDENT_AMBULATORY_CARE_PROVIDER_SITE_OTHER): Payer: Medicare Other | Admitting: Family Medicine

## 2021-05-06 VITALS — BP 118/72 | HR 81 | Resp 16 | Ht 67.0 in | Wt 150.0 lb

## 2021-05-06 DIAGNOSIS — D692 Other nonthrombocytopenic purpura: Secondary | ICD-10-CM | POA: Diagnosis not present

## 2021-05-06 DIAGNOSIS — I4891 Unspecified atrial fibrillation: Secondary | ICD-10-CM | POA: Diagnosis not present

## 2021-05-06 DIAGNOSIS — E039 Hypothyroidism, unspecified: Secondary | ICD-10-CM | POA: Diagnosis not present

## 2021-05-06 DIAGNOSIS — E1122 Type 2 diabetes mellitus with diabetic chronic kidney disease: Secondary | ICD-10-CM | POA: Diagnosis not present

## 2021-05-06 DIAGNOSIS — N183 Chronic kidney disease, stage 3 unspecified: Secondary | ICD-10-CM

## 2021-05-06 DIAGNOSIS — E1169 Type 2 diabetes mellitus with other specified complication: Secondary | ICD-10-CM | POA: Diagnosis not present

## 2021-05-06 DIAGNOSIS — I700 Atherosclerosis of aorta: Secondary | ICD-10-CM

## 2021-05-06 DIAGNOSIS — Z87442 Personal history of urinary calculi: Secondary | ICD-10-CM | POA: Diagnosis not present

## 2021-05-06 DIAGNOSIS — I5020 Unspecified systolic (congestive) heart failure: Secondary | ICD-10-CM | POA: Diagnosis not present

## 2021-05-06 DIAGNOSIS — I5022 Chronic systolic (congestive) heart failure: Secondary | ICD-10-CM

## 2021-05-06 DIAGNOSIS — E785 Hyperlipidemia, unspecified: Secondary | ICD-10-CM | POA: Diagnosis not present

## 2021-05-06 DIAGNOSIS — E1121 Type 2 diabetes mellitus with diabetic nephropathy: Secondary | ICD-10-CM

## 2021-05-06 DIAGNOSIS — N1831 Chronic kidney disease, stage 3a: Secondary | ICD-10-CM | POA: Diagnosis not present

## 2021-05-06 DIAGNOSIS — I129 Hypertensive chronic kidney disease with stage 1 through stage 4 chronic kidney disease, or unspecified chronic kidney disease: Secondary | ICD-10-CM

## 2021-05-06 DIAGNOSIS — G2581 Restless legs syndrome: Secondary | ICD-10-CM

## 2021-05-06 MED ORDER — ATORVASTATIN CALCIUM 10 MG PO TABS: 10.0000 mg | ORAL_TABLET | Freq: Every day | ORAL | 1 refills | Status: DC

## 2021-05-06 MED ORDER — ROPINIROLE HCL 1 MG PO TABS: 1.0000 mg | ORAL_TABLET | Freq: Every day | ORAL | 1 refills | Status: DC

## 2021-05-06 MED ORDER — LEVOTHYROXINE SODIUM 25 MCG PO TABS: 25.0000 ug | ORAL_TABLET | Freq: Every day | ORAL | 0 refills | Status: DC

## 2021-05-07 ENCOUNTER — Other Ambulatory Visit: Payer: Self-pay

## 2021-05-07 ENCOUNTER — Ambulatory Visit: Payer: Medicare Other | Admitting: Anesthesiology

## 2021-05-07 ENCOUNTER — Encounter
Admission: RE | Disposition: A | Discharge status: Home / Self Care | Payer: Self-pay | Source: Home / Self Care | Attending: Ophthalmology

## 2021-05-07 ENCOUNTER — Encounter: Payer: Self-pay | Admitting: Ophthalmology

## 2021-05-07 ENCOUNTER — Ambulatory Visit
Admission: RE | Admit: 2021-05-07 | Discharge: 2021-05-07 | Disposition: A | Discharge status: Home / Self Care | Payer: Medicare Other | Attending: Ophthalmology | Admitting: Ophthalmology

## 2021-05-07 DIAGNOSIS — I4891 Unspecified atrial fibrillation: Secondary | ICD-10-CM | POA: Diagnosis not present

## 2021-05-07 DIAGNOSIS — K219 Gastro-esophageal reflux disease without esophagitis: Secondary | ICD-10-CM | POA: Insufficient documentation

## 2021-05-07 DIAGNOSIS — E1122 Type 2 diabetes mellitus with diabetic chronic kidney disease: Secondary | ICD-10-CM | POA: Insufficient documentation

## 2021-05-07 DIAGNOSIS — N400 Enlarged prostate without lower urinary tract symptoms: Secondary | ICD-10-CM | POA: Diagnosis not present

## 2021-05-07 DIAGNOSIS — I509 Heart failure, unspecified: Secondary | ICD-10-CM | POA: Diagnosis not present

## 2021-05-07 DIAGNOSIS — H2511 Age-related nuclear cataract, right eye: Secondary | ICD-10-CM | POA: Diagnosis not present

## 2021-05-07 DIAGNOSIS — Z8249 Family history of ischemic heart disease and other diseases of the circulatory system: Secondary | ICD-10-CM | POA: Diagnosis not present

## 2021-05-07 DIAGNOSIS — E039 Hypothyroidism, unspecified: Secondary | ICD-10-CM | POA: Insufficient documentation

## 2021-05-07 DIAGNOSIS — Z955 Presence of coronary angioplasty implant and graft: Secondary | ICD-10-CM | POA: Diagnosis not present

## 2021-05-07 DIAGNOSIS — E1136 Type 2 diabetes mellitus with diabetic cataract: Secondary | ICD-10-CM | POA: Diagnosis not present

## 2021-05-07 DIAGNOSIS — H25811 Combined forms of age-related cataract, right eye: Secondary | ICD-10-CM | POA: Diagnosis not present

## 2021-05-07 DIAGNOSIS — I2510 Atherosclerotic heart disease of native coronary artery without angina pectoris: Secondary | ICD-10-CM | POA: Insufficient documentation

## 2021-05-07 DIAGNOSIS — N183 Chronic kidney disease, stage 3 unspecified: Secondary | ICD-10-CM | POA: Insufficient documentation

## 2021-05-07 DIAGNOSIS — I130 Hypertensive heart and chronic kidney disease with heart failure and stage 1 through stage 4 chronic kidney disease, or unspecified chronic kidney disease: Secondary | ICD-10-CM | POA: Insufficient documentation

## 2021-05-07 HISTORY — DX: Hypothyroidism, unspecified: E03.9

## 2021-05-07 HISTORY — DX: Chronic kidney disease, stage 3 unspecified: N18.30

## 2021-05-07 HISTORY — DX: Other complications of anesthesia, initial encounter: T88.59XA

## 2021-05-07 HISTORY — PX: CATARACT EXTRACTION W/PHACO: SHX586

## 2021-05-07 HISTORY — DX: Essential (primary) hypertension: I10

## 2021-05-07 HISTORY — DX: Heart failure, unspecified: I50.9

## 2021-05-07 HISTORY — DX: Unspecified atrial fibrillation: I48.91

## 2021-05-07 LAB — GLUCOSE, CAPILLARY
Glucose-Capillary: 118 mg/dL — ABNORMAL HIGH (ref 70–99)
Glucose-Capillary: 119 mg/dL — ABNORMAL HIGH (ref 70–99)

## 2021-05-07 LAB — HEMOGLOBIN A1C
Hgb A1c MFr Bld: 6.9 % of total Hgb — ABNORMAL HIGH (ref <5.7)
Mean Plasma Glucose: 151 mg/dL
eAG (mmol/L): 8.4 mmol/L

## 2021-05-07 SURGERY — PHACOEMULSIFICATION, CATARACT, WITH IOL INSERTION
Anesthesia: Monitor Anesthesia Care | Site: Eye | Laterality: Right

## 2021-05-07 MED ORDER — BRIMONIDINE TARTRATE-TIMOLOL 0.2-0.5 % OP SOLN
OPHTHALMIC | Status: DC | PRN
  Administered 2021-05-07: 1 [drp] via OPHTHALMIC

## 2021-05-07 MED ORDER — MOXIFLOXACIN HCL 0.5 % OP SOLN
OPHTHALMIC | Status: DC | PRN
  Administered 2021-05-07: 0.2 mL via OPHTHALMIC

## 2021-05-07 MED ORDER — OXYCODONE HCL 5 MG/5ML PO SOLN: 5.0000 mg | Freq: Once | ORAL | Status: DC | PRN

## 2021-05-07 MED ORDER — MIDAZOLAM HCL 2 MG/2ML IJ SOLN
INTRAMUSCULAR | Status: DC | PRN
Start: 2021-05-07 — End: 2021-05-07
  Administered 2021-05-07: 1 mg via INTRAVENOUS

## 2021-05-07 MED ORDER — ARMC OPHTHALMIC DILATING DROPS
1.0000 "application " | OPHTHALMIC | Status: DC | PRN
  Administered 2021-05-07 (×3): 1 via OPHTHALMIC

## 2021-05-07 MED ORDER — SIGHTPATH DOSE#1 BSS IO SOLN
INTRAOCULAR | Status: DC | PRN
  Administered 2021-05-07: 15 mL via INTRAOCULAR

## 2021-05-07 MED ORDER — OXYCODONE HCL 5 MG PO TABS: 5.0000 mg | ORAL_TABLET | Freq: Once | ORAL | Status: DC | PRN

## 2021-05-07 MED ORDER — SIGHTPATH DOSE#1 BSS IO SOLN
INTRAOCULAR | Status: DC | PRN
  Administered 2021-05-07: 64 mL via OPHTHALMIC

## 2021-05-07 MED ORDER — FENTANYL CITRATE (PF) 100 MCG/2ML IJ SOLN
INTRAMUSCULAR | Status: DC | PRN
  Administered 2021-05-07: 50 ug via INTRAVENOUS

## 2021-05-07 MED ORDER — SIGHTPATH DOSE#1 NA CHONDROIT SULF-NA HYALURON 40-17 MG/ML IO SOLN
INTRAOCULAR | Status: DC | PRN
  Administered 2021-05-07: 1 mL via INTRAOCULAR

## 2021-05-07 MED ORDER — LACTATED RINGERS IV SOLN: INTRAVENOUS | Status: DC

## 2021-05-07 MED ORDER — SIGHTPATH DOSE#1 BSS IO SOLN
INTRAOCULAR | Status: DC | PRN
  Administered 2021-05-07: 2 mL

## 2021-05-07 MED ORDER — TETRACAINE HCL 0.5 % OP SOLN
1.0000 [drp] | OPHTHALMIC | Status: DC | PRN
  Administered 2021-05-07 (×3): 1 [drp] via OPHTHALMIC

## 2021-05-07 SURGICAL SUPPLY — 11 items
CATARACT SUITE SIGHTPATH (MISCELLANEOUS) ×2 IMPLANT
Centurion Vision System FMS ×1 IMPLANT
FEE CATARACT SUITE SIGHTPATH (MISCELLANEOUS) ×1 IMPLANT
GLOVE SURG ENC TEXT LTX SZ8 (GLOVE) ×2 IMPLANT
GLOVE SURG TRIUMPH 8.0 PF LTX (GLOVE) ×2 IMPLANT
LENS IOL TECNIS EYHANCE 18.5 (Intraocular Lens) ×1 IMPLANT
NDL FILTER BLUNT 18X1 1/2 (NEEDLE) ×1 IMPLANT
NEEDLE FILTER BLUNT 18X 1/2SAF (NEEDLE) ×1
NEEDLE FILTER BLUNT 18X1 1/2 (NEEDLE) ×1 IMPLANT
SYR 3ML LL SCALE MARK (SYRINGE) ×2 IMPLANT
WATER STERILE IRR 250ML POUR (IV SOLUTION) ×2 IMPLANT

## 2021-05-07 NOTE — Op Note (Signed)
PREOPERATIVE DIAGNOSIS:  Nuclear sclerotic cataract of the right eye. ?  ?POSTOPERATIVE DIAGNOSIS:  Cataract ?  ?OPERATIVE PROCEDURE:ORPROCALL@ ?  ?SURGEON:  Birder Robson, MD. ?  ?ANESTHESIA: ? ?Anesthesiologist: Fidel Levy, MD ?CRNA: Silvana Newness, CRNA ? ?1.      Managed anesthesia care. ?2.      0.15m of Shugarcaine was instilled in the eye following the paracentesis. ?  ?COMPLICATIONS:  None. ?  ?TECHNIQUE:   Stop and chop ?  ?DESCRIPTION OF PROCEDURE:  The patient was examined and consented in the preoperative holding area where the aforementioned topical anesthesia was applied to the right eye and then brought back to the Operating Room where the right eye was prepped and draped in the usual sterile ophthalmic fashion and a lid speculum was placed. A paracentesis was created with the side port blade and the anterior chamber was filled with viscoelastic. A near clear corneal incision was performed with the steel keratome. A continuous curvilinear capsulorrhexis was performed with a cystotome followed by the capsulorrhexis forceps. Hydrodissection and hydrodelineation were carried out with BSS on a blunt cannula. The lens was removed in a stop and chop  technique and the remaining cortical material was removed with the irrigation-aspiration handpiece. The capsular bag was inflated with viscoelastic and the Technis [id number]  lens was placed in the capsular bag without complication. The remaining viscoelastic was removed from the eye with the irrigation-aspiration handpiece. The wounds were hydrated. The anterior chamber was flushed with BSS and the eye was inflated to physiologic pressure. 0.146mof Vigamox was placed in the anterior chamber. The wounds were found to be water tight. The eye was dressed with Combigan. The patient was given protective glasses to wear throughout the day and a shield with which to sleep tonight. The patient was also given drops with which to begin a drop regimen today and will  follow-up with me in one day. ?Implant Name Type Inv. Item Serial No. Manufacturer Lot No. LRB No. Used Action  ?LENS IOL TECNIS EYHANCE 18.5 - [id number] Lens LENS IOL TECNIS EYHANCE 18.5 [id number]  Right 1 Implanted  ? ?Procedure(s) with comments: ?CATARACT EXTRACTION PHACO AND INTRAOCULAR LENS PLACEMENT (IOC) RIGHT DIABETIC 8.63 00:56.5 (Right) - Diabetic ? ?Electronically signed: WiBirder Robson/18/2023 9:42 AM ? ?

## 2021-05-07 NOTE — Transfer of Care (Signed)
Immediate Anesthesia Transfer of Care Note ? ?Patient: Billy Franco ? ?Procedure(s) Performed: CATARACT EXTRACTION PHACO AND INTRAOCULAR LENS PLACEMENT (IOC) RIGHT DIABETIC 8.63 00:56.5 (Right: Eye) ? ?Patient Location: PACU ? ?Anesthesia Type: MAC ? ?Level of Consciousness: awake, alert  and patient cooperative ? ?Airway and Oxygen Therapy: Patient Spontanous Breathing and Patient connected to supplemental oxygen ? ?Post-op Assessment: Post-op Vital signs reviewed, Patient's Cardiovascular Status Stable, Respiratory Function Stable, Patent Airway and No signs of Nausea or vomiting ? ?Post-op Vital Signs: Reviewed and stable ? ?Complications: No notable events documented. ? ?

## 2021-05-07 NOTE — Anesthesia Postprocedure Evaluation (Signed)
Anesthesia Post Note ? ?Patient: Billy Franco ? ?Procedure(s) Performed: CATARACT EXTRACTION PHACO AND INTRAOCULAR LENS PLACEMENT (IOC) RIGHT DIABETIC 8.63 00:56.5 (Right: Eye) ? ? ?  ?Patient location during evaluation: PACU ?Anesthesia Type: MAC ?Level of consciousness: awake and alert ?Pain management: pain level controlled ?Vital Signs Assessment: post-procedure vital signs reviewed and stable ?Respiratory status: spontaneous breathing, nonlabored ventilation, respiratory function stable and patient connected to nasal cannula oxygen ?Cardiovascular status: stable and blood pressure returned to baseline ?Postop Assessment: no apparent nausea or vomiting ?Anesthetic complications: no ? ? ?No notable events documented. ? ?Fidel Levy ? ? ? ? ? ?

## 2021-05-07 NOTE — H&P (Signed)
[facility]  ? ?Primary Care Physician:  Steele Sizer, MD ?Ophthalmologist: Dr. George Ina ? ?Pre-Procedure History & Physical: ?HPI:  Billy Franco is a 81 y.o. male here for cataract surgery. ?  ?Past Medical History:  ?Diagnosis Date  ? Arthritis   ? Lower back, hips  ? Atrial fibrillation ([place])   ? CHF (congestive heart failure) ([place])   ? CKD (chronic kidney disease), stage III ([place])   ? Complication of anesthesia   ? has "crooked airway"  Diff breathing after extubated (after kidney stone procedure 07/2013)  ? Diabetes mellitus without complication ([place])   ? GERD (gastroesophageal reflux disease)   ? Hypertension   ? Hypothyroidism   ? Nephrolithiasis   ? ? ?Past Surgical History:  ?Procedure Laterality Date  ? 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 Procedure: CORONARY STENT INTERVENTION;  Surgeon: Yolonda Kida, MD;  Location: [facility];  Service: Cardiovascular;  Laterality: N/A;  ? LEFT HEART CATH AND CORONARY ANGIOGRAPHY Left 03/08/2020  ? Procedure: LEFT HEART CATH AND CORONARY ANGIOGRAPHY;  Surgeon: Yolonda Kida, MD;  Location: [facility];  Service: Cardiovascular;  Laterality: Left;  ? NASAL FRACTURE SURGERY  1994  ? URETEROLITHOTOMY Right 2015  ? ? ?Prior to Admission medications   ?Medication Sig Start Date End Date Taking? Authorizing Provider  ?acetaminophen (TYLENOL) 650 MG CR tablet Take 650 mg by mouth every 8 (eight) hours as needed for pain.   Yes [provider]  ?atorvastatin (LIPITOR) 10 MG tablet Take 1 tablet (10 mg total) by mouth daily at 6 PM. 05/06/21  Yes Sowles, Drue Stager, MD  ?carvedilol (COREG) 3.125 MG tablet Take 1 tablet (3.125 mg total) by mouth 2 (two) times daily with a meal. 12/25/19 05/07/21 Yes Vashti Hey, MD  ?clopidogrel (PLAVIX) 75 MG tablet Take 1 tablet (75 mg total) by mouth daily with breakfast for 30 doses. 03/09/20 05/07/21 Yes Callwood, Dwayne D, MD  ?Cyanocobalamin (B-12) 1000 MCG TBCR Take 1,000 mcg  by mouth daily. 01/02/20  Yes [provider]  ?docusate sodium (COLACE) 50 MG capsule Take 100 mg by mouth 2 (two) times daily as needed for mild constipation.   Yes [provider]  ?fexofenadine-pseudoephedrine (ALLEGRA-D 24) 180-240 MG 24 hr tablet Take 1 tablet by mouth daily as needed (allergies).   Yes [provider]  ?furosemide (LASIX) 20 MG tablet Take 20 mg by mouth daily. 01/02/20  Yes Callwood, Dwayne D, MD  ?levothyroxine (SYNTHROID) 25 MCG tablet Take 1 tablet (25 mcg total) by mouth daily before breakfast. And two once a week 05/06/21  Yes Sowles, Drue Stager, MD  ?losartan (COZAAR) 25 MG tablet Take 1 tablet (25 mg total) by mouth at bedtime. 12/25/19  Yes Vashti Hey, MD  ?magnesium oxide (MAG-OX) 400 MG tablet Take 400 mg by mouth daily.   Yes [provider]  ?metFORMIN (GLUCOPHAGE XR) 500 MG 24 hr tablet Take 2 tablets (1,000 mg total) by mouth daily with breakfast. 12/31/20  Yes Sowles, Drue Stager, MD  ?Nutritional Supplements (THERALITH XR PO) Take by mouth in the morning and at bedtime. Take 2 tablets by mouth 2 times daily   Yes [provider]  ?omeprazole (PRILOSEC) 20 MG capsule TAKE 1 CAPSULE (20 MG TOTAL) BY MOUTH EVERY OTHER DAY. 08/29/20  Yes Sowles, Drue Stager, MD  ?rOPINIRole (REQUIP) 1 MG tablet Take 1 tablet (1 mg total) by mouth at bedtime. 05/06/21  Yes Sowles, Drue Stager, MD  ?tamsulosin (FLOMAX) 0.4 MG CAPS  capsule Take 0.4 mg by mouth every evening.   Yes Jolaine Click, MD  ?triamcinolone (KENALOG) 0.1 % Apply 1 application topically 2 (two) times daily as needed (rash).   Yes [provider]  ?glucose blood (ONETOUCH ULTRA) test strip USE AS DIRECTED TO CHECK BG ONCE DAILY. 12/31/20   Steele Sizer, MD  ?HYDROcodone-acetaminophen (NORCO) 10-325 MG tablet Take 1 tablet by mouth every 4 (four) hours as needed. 12/25/20   Royston Cowper, MD  ?Lancets 33G MISC 1 each by Does not apply route daily at 12 noon. 12/31/20    Steele Sizer, MD  ?ondansetron (ZOFRAN-ODT) 4 MG disintegrating tablet  12/25/20   Royston Cowper, MD  ? ? ?Allergies as of 03/21/2021 - Review Complete 12/31/2020  ?Allergen Reaction Noted  ? Codeine Rash and Other (See Comments) 07/31/2014  ? Lovastatin Rash and Other (See Comments) 07/31/2014  ? ? ?Family History  ?Problem Relation Age of Onset  ? Heart disease Father   ? ? ?Social History  ? ?Socioeconomic History  ? Marital status: Married  ?  Spouse name: Dennie Bible   ? Number of children: 3  ? Years of education: Not on file  ? Highest education level: Not on file  ?Occupational History  ? Occupation: retired   ?  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 Smoking status: Never  ? Smokeless tobacco: Never  ?Vaping Use  ? Vaping Use: Never used  ?Substance and Sexual Activity  ? Alcohol use: No  ?  Alcohol/week: 0.0 standard drinks  ? Drug use: No  ? Sexual activity: Not Currently  ?Other Topics Concern  ? Not on file  ?Social History Narrative  ? Married, had 3 children, one died at a MVA at age 53  ? Doristine Bos[employer] of a church   ? ?Social Determinants of Health  ? ?Financial Resource Strain: Not on file  ?Food Insecurity: Not on file  ?Transportation Needs: Not on file  ?Physical Activity: Not on file  ?Stress: Not on file  ?Social Connections: Not on file  ?Intimate Partner Violence: Not on file  ? ? ?Review of Systems: ?See HPI, otherwise negative ROS ? ?Physical Exam: ?BP (!) 129/42   Pulse (!) 54   Temp (!) 97.2 ?F (36.2 ?C) (Temporal)   Ht '5\' 7"'$  (1.702 m)   Wt 68.5 kg   SpO2 100%   BMI 23.65 kg/m?  ?General:   Alert, cooperative in NAD ?Head:  Normocephalic and atraumatic. ?Respiratory:  Normal work of breathing. ?Cardiovascular:  RRR ? ?Impression/Plan: ?Billy Franco is here for cataract surgery. ? ?Risks, benefits, limitations, and alternatives regarding cataract surgery have been reviewed with the patient.  Questions have been answered.  All parties agreeable. ? ? ?Birder Robson, MD   05/07/2021, 9:13 AM ? ? ?

## 2021-05-07 NOTE — Anesthesia Preprocedure Evaluation (Signed)
Anesthesia Evaluation  ?Patient identified by MRN, date of birth, ID band ?Patient awake ? ? ? ?Reviewed: ?NPO status  ? ?History of Anesthesia Complications ?(+) PROLONGED EMERGENCE and history of anesthetic complications (has "crooked airway"  Diff breathing after extubated (after kidney stone procedure 07/2013)) ? ?Airway ?Mallampati: II ? ?TM Distance: >3 FB ?Neck ROM: full ? ? ? Dental ? ?(+) Missing,  ?  ?Pulmonary ?neg pulmonary ROS,  ?  ?Pulmonary exam normal ? ? ? ? ? ? ? Cardiovascular ?Exercise Tolerance: Good ?hypertension, + CAD, + Cardiac Stents (feb 2022) and +CHF (ef=30%; Cardiomyopathy)  ?+ dysrhythmias Atrial Fibrillation  ? ?pcpSteele Sizer, MD at 05/06/2021 ; ?cards cleared:  Jobe Gibbon, MD at 04/19/2021 ;  ?"does not want pacemaker / aicd" ? ?Echocardiogram 2D complete 05/14/20: ?INTERPRETATION ?MODERATE LV SYSTOLIC DYSFUNCTION (See above) ?MILD RV SYSTOLIC DYSFUNCTION (See above) ?NO VALVULAR STENOSIS ?MODERATE MR, TR ?MILD AR, PR ?EF 30% ? ?Cardiac Catheterization 03/08/20:  ?? Mid LAD lesion is 75% stenosed. ?? Post intervention, there is a 0% residual stenosis. ?? A stent was successfully placed. ?? Moderately depressed left ventricular function ejection fraction between  ?35 and 40% ?? Circumflex free of disease ?? RCA free of disease ?? PCI and stent DES to proximal LAD ? ?ekg: 06/2020: SB ?  ?Neuro/Psych ?RLS ?negative psych ROS  ? GI/Hepatic ?Neg liver ROS, GERD  Controlled,  ?Endo/Other  ?diabetesHypothyroidism  ? Renal/GU ?CRFRenal disease (ckd3)  ? ?bph ? ?  ?Musculoskeletal ? ?(+) Arthritis ,  ? Abdominal ?  ?Peds ? Hematology ?negative hematology ROS ?(+)   ?Anesthesia Other Findings ?last plavix: 4/18; ? Reproductive/Obstetrics ? ?  ? ? ? ? ? ? ? ? ? ? ? ? ? ?  ?  ? ? ? ? ? ? ? ? ?Anesthesia Physical ?Anesthesia Plan ? ?ASA: 3 ? ?Anesthesia Plan: MAC  ? ?Post-op Pain Management:   ? ?Induction:  ? ?PONV Risk Score and Plan: 1 and  TIVA ? ?Airway Management Planned:  ? ?Additional Equipment:  ? ?Intra-op Plan:  ? ?Post-operative Plan:  ? ?Informed Consent: I have reviewed the patients History and Physical, chart, labs and discussed the procedure including the risks, benefits and alternatives for the proposed anesthesia with the patient or authorized representative who has indicated his/her understanding and acceptance.  ? ? ? ? ? ?Plan Discussed with: CRNA ? ?Anesthesia Plan Comments:   ? ? ? ? ? ? ?Anesthesia Quick Evaluation ? ?

## 2021-05-08 ENCOUNTER — Encounter: Payer: Self-pay | Admitting: Ophthalmology

## 2021-05-08 ENCOUNTER — Other Ambulatory Visit: Payer: Self-pay

## 2021-05-12 ENCOUNTER — Other Ambulatory Visit: Payer: Self-pay | Admitting: Family Medicine

## 2021-05-12 DIAGNOSIS — E039 Hypothyroidism, unspecified: Secondary | ICD-10-CM

## 2021-05-13 DIAGNOSIS — H2512 Age-related nuclear cataract, left eye: Secondary | ICD-10-CM | POA: Diagnosis not present

## 2021-05-16 NOTE — Discharge Instructions (Signed)

## 2021-05-21 ENCOUNTER — Encounter
Admission: RE | Disposition: A | Discharge status: Home / Self Care | Payer: Self-pay | Source: Home / Self Care | Attending: Ophthalmology

## 2021-05-21 ENCOUNTER — Ambulatory Visit: Payer: Medicare Other | Admitting: Anesthesiology

## 2021-05-21 ENCOUNTER — Other Ambulatory Visit: Payer: Self-pay

## 2021-05-21 ENCOUNTER — Ambulatory Visit
Admission: RE | Admit: 2021-05-21 | Discharge: 2021-05-21 | Disposition: A | Discharge status: Home / Self Care | Payer: Medicare Other | Attending: Ophthalmology | Admitting: Ophthalmology

## 2021-05-21 ENCOUNTER — Encounter: Payer: Self-pay | Admitting: Ophthalmology

## 2021-05-21 DIAGNOSIS — K219 Gastro-esophageal reflux disease without esophagitis: Secondary | ICD-10-CM | POA: Insufficient documentation

## 2021-05-21 DIAGNOSIS — I2510 Atherosclerotic heart disease of native coronary artery without angina pectoris: Secondary | ICD-10-CM | POA: Diagnosis not present

## 2021-05-21 DIAGNOSIS — I4891 Unspecified atrial fibrillation: Secondary | ICD-10-CM | POA: Insufficient documentation

## 2021-05-21 DIAGNOSIS — N400 Enlarged prostate without lower urinary tract symptoms: Secondary | ICD-10-CM | POA: Insufficient documentation

## 2021-05-21 DIAGNOSIS — I429 Cardiomyopathy, unspecified: Secondary | ICD-10-CM | POA: Diagnosis not present

## 2021-05-21 DIAGNOSIS — G2581 Restless legs syndrome: Secondary | ICD-10-CM | POA: Insufficient documentation

## 2021-05-21 DIAGNOSIS — Z7984 Long term (current) use of oral hypoglycemic drugs: Secondary | ICD-10-CM | POA: Diagnosis not present

## 2021-05-21 DIAGNOSIS — I509 Heart failure, unspecified: Secondary | ICD-10-CM | POA: Diagnosis not present

## 2021-05-21 DIAGNOSIS — E039 Hypothyroidism, unspecified: Secondary | ICD-10-CM | POA: Insufficient documentation

## 2021-05-21 DIAGNOSIS — H2512 Age-related nuclear cataract, left eye: Secondary | ICD-10-CM | POA: Insufficient documentation

## 2021-05-21 DIAGNOSIS — E1136 Type 2 diabetes mellitus with diabetic cataract: Secondary | ICD-10-CM | POA: Insufficient documentation

## 2021-05-21 DIAGNOSIS — I130 Hypertensive heart and chronic kidney disease with heart failure and stage 1 through stage 4 chronic kidney disease, or unspecified chronic kidney disease: Secondary | ICD-10-CM | POA: Insufficient documentation

## 2021-05-21 DIAGNOSIS — H25812 Combined forms of age-related cataract, left eye: Secondary | ICD-10-CM | POA: Diagnosis not present

## 2021-05-21 DIAGNOSIS — Z955 Presence of coronary angioplasty implant and graft: Secondary | ICD-10-CM | POA: Diagnosis not present

## 2021-05-21 HISTORY — PX: CATARACT EXTRACTION W/PHACO: SHX586

## 2021-05-21 LAB — GLUCOSE, CAPILLARY: Glucose-Capillary: 128 mg/dL — ABNORMAL HIGH (ref 70–99)

## 2021-05-21 SURGERY — PHACOEMULSIFICATION, CATARACT, WITH IOL INSERTION
Anesthesia: Monitor Anesthesia Care | Site: Eye | Laterality: Left

## 2021-05-21 MED ORDER — MEPERIDINE HCL 25 MG/ML IJ SOLN: 6.2500 mg | INTRAMUSCULAR | Status: DC | PRN

## 2021-05-21 MED ORDER — PHENYLEPHRINE-KETOROLAC 1-0.3 % IO SOLN
INTRAOCULAR | Status: DC | PRN
  Administered 2021-05-21: 88 mL via OPHTHALMIC

## 2021-05-21 MED ORDER — FENTANYL CITRATE PF 50 MCG/ML IJ SOSY: 25.0000 ug | PREFILLED_SYRINGE | INTRAMUSCULAR | Status: DC | PRN

## 2021-05-21 MED ORDER — SIGHTPATH DOSE#1 BSS IO SOLN
INTRAOCULAR | Status: DC | PRN
  Administered 2021-05-21: 1 mL via INTRAMUSCULAR

## 2021-05-21 MED ORDER — EPHEDRINE SULFATE (PRESSORS) 50 MG/ML IJ SOLN
INTRAMUSCULAR | Status: DC | PRN
  Administered 2021-05-21: 5 mg via INTRAVENOUS

## 2021-05-21 MED ORDER — LACTATED RINGERS IV SOLN: INTRAVENOUS | Status: DC

## 2021-05-21 MED ORDER — ARMC OPHTHALMIC DILATING DROPS
1.0000 "application " | OPHTHALMIC | Status: DC | PRN
  Administered 2021-05-21 (×3): 1 via OPHTHALMIC

## 2021-05-21 MED ORDER — BRIMONIDINE TARTRATE-TIMOLOL 0.2-0.5 % OP SOLN
OPHTHALMIC | Status: DC | PRN
  Administered 2021-05-21: 1 [drp] via OPHTHALMIC

## 2021-05-21 MED ORDER — TETRACAINE HCL 0.5 % OP SOLN
1.0000 [drp] | OPHTHALMIC | Status: DC | PRN
  Administered 2021-05-21 (×3): 1 [drp] via OPHTHALMIC

## 2021-05-21 MED ORDER — OXYCODONE HCL 5 MG PO TABS: 5.0000 mg | ORAL_TABLET | Freq: Once | ORAL | Status: DC | PRN

## 2021-05-21 MED ORDER — MIDAZOLAM HCL 2 MG/2ML IJ SOLN
INTRAMUSCULAR | Status: DC | PRN
  Administered 2021-05-21: 1 mg via INTRAVENOUS

## 2021-05-21 MED ORDER — OXYCODONE HCL 5 MG/5ML PO SOLN: 5.0000 mg | Freq: Once | ORAL | Status: DC | PRN

## 2021-05-21 MED ORDER — FENTANYL CITRATE (PF) 100 MCG/2ML IJ SOLN
INTRAMUSCULAR | Status: DC | PRN
  Administered 2021-05-21: 50 ug via INTRAVENOUS

## 2021-05-21 MED ORDER — SIGHTPATH DOSE#1 NA CHONDROIT SULF-NA HYALURON 40-17 MG/ML IO SOLN
INTRAOCULAR | Status: DC | PRN
  Administered 2021-05-21: 1 mL via INTRAOCULAR

## 2021-05-21 MED ORDER — MOXIFLOXACIN HCL 0.5 % OP SOLN
OPHTHALMIC | Status: DC | PRN
  Administered 2021-05-21: 0.2 mL via OPHTHALMIC

## 2021-05-21 SURGICAL SUPPLY — 10 items
CATARACT SUITE SIGHTPATH (MISCELLANEOUS) ×2 IMPLANT
FEE CATARACT SUITE SIGHTPATH (MISCELLANEOUS) ×1 IMPLANT
GLOVE SURG ENC TEXT LTX SZ8 (GLOVE) ×2 IMPLANT
GLOVE SURG TRIUMPH 8.0 PF LTX (GLOVE) ×2 IMPLANT
LENS IOL TECNIS EYHANCE 18.5 (Intraocular Lens) ×1 IMPLANT
NDL FILTER BLUNT 18X1 1/2 (NEEDLE) ×1 IMPLANT
NEEDLE FILTER BLUNT 18X 1/2SAF (NEEDLE) ×1
NEEDLE FILTER BLUNT 18X1 1/2 (NEEDLE) ×1 IMPLANT
SYR 3ML LL SCALE MARK (SYRINGE) ×2 IMPLANT
WATER STERILE IRR 250ML POUR (IV SOLUTION) ×2 IMPLANT

## 2021-05-21 NOTE — Anesthesia Postprocedure Evaluation (Signed)
Anesthesia Post Note ? ?Patient: Billy Franco ? ?Procedure(s) Performed: CATARACT EXTRACTION PHACO AND INTRAOCULAR LENS PLACEMENT (IOC) LEFT DIABETIC 7.27 01:01.5 (Left: Eye) ? ? ?  ?Patient location during evaluation: PACU ?Anesthesia Type: MAC ?Level of consciousness: awake and alert ?Pain management: pain level controlled ?Vital Signs Assessment: post-procedure vital signs reviewed and stable ?Respiratory status: spontaneous breathing, nonlabored ventilation, respiratory function stable and patient connected to nasal cannula oxygen ?Cardiovascular status: stable and blood pressure returned to baseline ?Postop Assessment: no apparent nausea or vomiting ?Anesthetic complications: no ? ? ?No notable events documented. ? ?Deniz Hannan ELAINE ? ? ? ? ? ?

## 2021-05-21 NOTE — Anesthesia Preprocedure Evaluation (Signed)
Anesthesia Evaluation  ?Patient identified by MRN, date of birth, ID band ?Patient awake ? ? ? ?Reviewed: ?NPO status  ? ?History of Anesthesia Complications ?(+) PROLONGED EMERGENCE and history of anesthetic complications (has "crooked airway"  Diff breathing after extubated (after kidney stone procedure 07/2013)) ? ?Airway ?Mallampati: II ? ?TM Distance: >3 FB ?Neck ROM: full ? ? ? Dental ? ?(+) Missing,  ?  ?Pulmonary ?neg pulmonary ROS,  ?  ?Pulmonary exam normal ? ? ? ? ? ? ? Cardiovascular ?Exercise Tolerance: Good ?hypertension, + CAD, + Cardiac Stents (feb 2022) and +CHF (ef=30%; Cardiomyopathy)  ?+ dysrhythmias Atrial Fibrillation  ? ?pcpSteele Sizer, MD at 05/06/2021 ; ?cards cleared:  Jobe Gibbon, MD at 04/19/2021 ;  ?"does not want pacemaker / aicd" ? ?Echocardiogram 2D complete 05/14/20: ?INTERPRETATION ?MODERATE LV SYSTOLIC DYSFUNCTION (See above) ?MILD RV SYSTOLIC DYSFUNCTION (See above) ?NO VALVULAR STENOSIS ?MODERATE MR, TR ?MILD AR, PR ?EF 30% ? ?Cardiac Catheterization 03/08/20:  ?? Mid LAD lesion is 75% stenosed. ?? Post intervention, there is a 0% residual stenosis. ?? A stent was successfully placed. ?? Moderately depressed left ventricular function ejection fraction between  ?35 and 40% ?? Circumflex free of disease ?? RCA free of disease ?? PCI and stent DES to proximal LAD ? ?ekg: 06/2020: SB ?  ?Neuro/Psych ?RLS ?negative psych ROS  ? GI/Hepatic ?Neg liver ROS, GERD  Controlled,  ?Endo/Other  ?diabetesHypothyroidism  ? Renal/GU ?CRFRenal disease (ckd3)  ? ?bph ? ?  ?Musculoskeletal ? ?(+) Arthritis ,  ? Abdominal ?  ?Peds ? Hematology ?negative hematology ROS ?(+)   ?Anesthesia Other Findings ?last plavix: 4/18; ? Reproductive/Obstetrics ? ?  ? ? ? ? ? ? ? ? ? ? ? ? ? ?  ?  ? ? ? ? ? ? ? ? ?Anesthesia Physical ? ?Anesthesia Plan ? ?ASA: 3 ? ?Anesthesia Plan: MAC  ? ?Post-op Pain Management:   ? ?Induction:  ? ?PONV Risk Score and Plan: 1 and  TIVA ? ?Airway Management Planned:  ? ?Additional Equipment:  ? ?Intra-op Plan:  ? ?Post-operative Plan:  ? ?Informed Consent: I have reviewed the patients History and Physical, chart, labs and discussed the procedure including the risks, benefits and alternatives for the proposed anesthesia with the patient or authorized representative who has indicated his/her understanding and acceptance.  ? ? ? ? ? ?Plan Discussed with: CRNA ? ?Anesthesia Plan Comments:   ? ? ? ? ? ? ?Anesthesia Quick Evaluation ? ?

## 2021-05-21 NOTE — Transfer of Care (Signed)
Immediate Anesthesia Transfer of Care Note ? ?Patient: Billy Franco ? ?Procedure(s) Performed: CATARACT EXTRACTION PHACO AND INTRAOCULAR LENS PLACEMENT (IOC) LEFT DIABETIC 7.27 01:01.5 (Left: Eye) ? ?Patient Location: PACU ? ?Anesthesia Type: MAC ? ?Level of Consciousness: awake, alert  and patient cooperative ? ?Airway and Oxygen Therapy: Patient Spontanous Breathing and Patient connected to supplemental oxygen ? ?Post-op Assessment: Post-op Vital signs reviewed, Patient's Cardiovascular Status Stable, Respiratory Function Stable, Patent Airway and No signs of Nausea or vomiting ? ?Post-op Vital Signs: Reviewed and stable ? ?Complications: No notable events documented. ? ?

## 2021-05-21 NOTE — Op Note (Signed)
PREOPERATIVE DIAGNOSIS:  Nuclear sclerotic cataract of the left eye. ?  ?POSTOPERATIVE DIAGNOSIS:  Nuclear sclerotic cataract of the left eye. ?  ?OPERATIVE PROCEDURE:ORPROCALL@ ?  ?SURGEON:  Birder Robson, MD. ?  ?ANESTHESIA: ? ?Anesthesiologist: Marice Potter, MD ?CRNA: Garner Nash, CRNA ? ?1.      Managed anesthesia care. ?2.     0.69m of Shugarcaine was instilled following the paracentesis ?  ?COMPLICATIONS: Omidria was used in the infusion ? ?  ?TECHNIQUE:   Stop and chop ?  ?DESCRIPTION OF PROCEDURE:  The patient was examined and consented in the preoperative holding area where the aforementioned topical anesthesia was applied to the left eye and then brought back to the Operating Room where the left eye was prepped and draped in the usual sterile ophthalmic fashion and a lid speculum was placed. A paracentesis was created with the side port blade and the anterior chamber was filled with viscoelastic. A near clear corneal incision was performed with the steel keratome. A continuous curvilinear capsulorrhexis was performed with a cystotome followed by the capsulorrhexis forceps. Hydrodissection and hydrodelineation were carried out with BSS on a blunt cannula. The lens was removed in a stop and chop  technique and the remaining cortical material was removed with the irrigation-aspiration handpiece. The capsular bag was inflated with viscoelastic and the Technis [id number] lens was placed in the capsular bag without complication. The remaining viscoelastic was removed from the eye with the irrigation-aspiration handpiece. The wounds were hydrated. The anterior chamber was flushed with BSS and the eye was inflated to physiologic pressure. 0.115mVigamox was placed in the anterior chamber. The wounds were found to be water tight. The eye was dressed with Combigan. The patient was given protective glasses to wear throughout the day and a shield with which to sleep tonight. The patient was also given  drops with which to begin a drop regimen today and will follow-up with me in one day. ?Implant Name Type Inv. Item Serial No. Manufacturer Lot No. LRB No. Used Action  ?LENS IOL TECNIS EYHANCE 18.5 - [id number] Lens LENS IOL TECNIS EYHANCE 18.5 [id number]  Left 1 Implanted  ?  ?Procedure(s) with comments: ?CATARACT EXTRACTION PHACO AND INTRAOCULAR LENS PLACEMENT (IOC) LEFT DIABETIC 7.27 01:01.5 (Left) - Diabetic ? ?Electronically signed: WiBirder Robson/02/2021 10:11 AM ? ?

## 2021-05-21 NOTE — H&P (Signed)
[facility]  ? ?Primary Care Physician:  Steele Sizer, MD ?Ophthalmologist: Dr. George Ina ? ?Pre-Procedure History & Physical: ?HPI:  Billy Franco is a 81 y.o. male here for cataract surgery. ?  ?Past Medical History:  ?Diagnosis Date  ? Arthritis   ? Lower back, hips  ? Atrial fibrillation ([place])   ? CHF (congestive heart failure) ([place])   ? CKD (chronic kidney disease), stage III ([place])   ? Complication of anesthesia   ? has "crooked airway"  Diff breathing after extubated (after kidney stone procedure 07/2013)  ? Diabetes mellitus without complication ([place])   ? GERD (gastroesophageal reflux disease)   ? Hypertension   ? Hypothyroidism   ? Nephrolithiasis   ? ? ?Past Surgical History:  ?Procedure Laterality Date  ? CATARACT EXTRACTION W/PHACO Right 05/07/2021  ? Procedure: CATARACT EXTRACTION PHACO AND INTRAOCULAR LENS PLACEMENT (IOC) RIGHT DIABETIC 8.63 00:56.5;  Surgeon: Birder Robson, MD;  Location: [facility];  Service: Ophthalmology;  Laterality: Right;  Diabetic  ? 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 Procedure: CORONARY STENT INTERVENTION;  Surgeon: Yolonda Kida, MD;  Location: [facility];  Service: Cardiovascular;  Laterality: N/A;  ? LEFT HEART CATH AND CORONARY ANGIOGRAPHY Left 03/08/2020  ? Procedure: LEFT HEART CATH AND CORONARY ANGIOGRAPHY;  Surgeon: Yolonda Kida, MD;  Location: [facility];  Service: Cardiovascular;  Laterality: Left;  ? NASAL FRACTURE SURGERY  1994  ? URETEROLITHOTOMY Right 2015  ? ? ?Prior to Admission medications   ?Medication Sig Start Date End Date Taking? Authorizing Provider  ?acetaminophen (TYLENOL) 650 MG CR tablet Take 650 mg by mouth every 8 (eight) hours as needed for pain.   Yes [provider]  ?atorvastatin (LIPITOR) 10 MG tablet Take 1 tablet (10 mg total) by mouth daily at 6 PM. 05/06/21  Yes Sowles, Drue Stager, MD  ?carvedilol (COREG) 3.125 MG tablet Take 1 tablet (3.125 mg total) by mouth 2 (two) times  daily with a meal. 12/25/19 05/21/21 Yes Vashti Hey, MD  ?clopidogrel (PLAVIX) 75 MG tablet Take 1 tablet (75 mg total) by mouth daily with breakfast for 30 doses. 03/09/20 05/21/21 Yes Callwood, Dwayne D, MD  ?Cyanocobalamin (B-12) 1000 MCG TBCR Take 1,000 mcg by mouth daily. 01/02/20  Yes [provider]  ?docusate sodium (COLACE) 50 MG capsule Take 100 mg by mouth 2 (two) times daily as needed for mild constipation.   Yes [provider]  ?fexofenadine-pseudoephedrine (ALLEGRA-D 24) 180-240 MG 24 hr tablet Take 1 tablet by mouth daily as needed (allergies).   Yes [provider]  ?furosemide (LASIX) 20 MG tablet Take 20 mg by mouth daily. 01/02/20  Yes Callwood, Dwayne D, MD  ?HYDROcodone-acetaminophen (NORCO) 10-325 MG tablet Take 1 tablet by mouth every 4 (four) hours as needed. 12/25/20  Yes Royston Cowper, MD  ?levothyroxine (SYNTHROID) 25 MCG tablet TAKE 1 TABLET BY MOUTH DAILY BEFORE BREAKFAST. 05/12/21  Yes Sowles, Drue Stager, MD  ?losartan (COZAAR) 25 MG tablet Take 1 tablet (25 mg total) by mouth at bedtime. 12/25/19  Yes Vashti Hey, MD  ?magnesium oxide (MAG-OX) 400 MG tablet Take 400 mg by mouth daily.   Yes [provider]  ?metFORMIN (GLUCOPHAGE XR) 500 MG 24 hr tablet Take 2 tablets (1,000 mg total) by mouth daily with breakfast. 12/31/20  Yes Sowles, Drue Stager, MD  ?Nutritional Supplements (THERALITH XR PO) Take by mouth in the morning and at bedtime. Take 2 tablets by mouth 2 times daily  Yes [provider]  ?omeprazole (PRILOSEC) 20 MG capsule TAKE 1 CAPSULE (20 MG TOTAL) BY MOUTH EVERY OTHER DAY. 08/29/20  Yes Sowles, Drue Stager, MD  ?ondansetron (ZOFRAN-ODT) 4 MG disintegrating tablet  12/25/20  Yes Royston Cowper, MD  ?rOPINIRole (REQUIP) 1 MG tablet Take 1 tablet (1 mg total) by mouth at bedtime. 05/06/21  Yes Sowles, Drue Stager, MD  ?tamsulosin (FLOMAX) 0.4 MG CAPS capsule Take 0.4 mg by mouth every evening.   Yes Jolaine Click, MD   ?triamcinolone (KENALOG) 0.1 % Apply 1 application topically 2 (two) times daily as needed (rash).   Yes [provider]  ?glucose blood (ONETOUCH ULTRA) test strip USE AS DIRECTED TO CHECK BG ONCE DAILY. 12/31/20   Steele Sizer, MD  ?Lancets 33G MISC 1 each by Does not apply route daily at 12 noon. 12/31/20   Steele Sizer, MD  ? ? ?Allergies as of 03/21/2021 - Review Complete 12/31/2020  ?Allergen Reaction Noted  ? Codeine Rash and Other (See Comments) 07/31/2014  ? Lovastatin Rash and Other (See Comments) 07/31/2014  ? ? ?Family History  ?Problem Relation Age of Onset  ? Heart disease Father   ? ? ?Social History  ? ?Socioeconomic History  ? Marital status: Married  ?  Spouse name: Dennie Bible   ? Number of children: 3  ? Years of education: Not on file  ? Highest education level: Not on file  ?Occupational History  ? Occupation: retired   ?  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 Smoking status: Never  ? Smokeless tobacco: Never  ?Vaping Use  ? Vaping Use: Never used  ?Substance and Sexual Activity  ? Alcohol use: No  ?  Alcohol/week: 0.0 standard drinks  ? Drug use: No  ? Sexual activity: Not Currently  ?Other Topics Concern  ? Not on file  ?Social History Narrative  ? Married, had 3 children, one died at a MVA at age 33  ? [employer] of a [employer]   ? ?Social Determinants of Health  ? ?Financial Resource Strain: Not on file  ?Food Insecurity: Not on file  ?Transportation Needs: Not on file  ?Physical Activity: Not on file  ?Stress: Not on file  ?Social Connections: Not on file  ?Intimate Partner Violence: Not on file  ? ? ?Review of Systems: ?See HPI, otherwise negative ROS ? ?Physical Exam: ?BP (!) 121/41   Pulse (!) 51   Temp (!) 97.5 ?F (36.4 ?C) (Temporal)   Resp 16   Ht '5\' 7"'$  (1.702 m)   Wt 68 kg   SpO2 96%   BMI 23.49 kg/m?  ?General:   Alert, cooperative in NAD ?Head:  Normocephalic and atraumatic. ?Respiratory:  Normal work of breathing. ?Cardiovascular:   RRR ? ?Impression/Plan: ?Billy Franco is here for cataract surgery. ? ?Risks, benefits, limitations, and alternatives regarding cataract surgery have been reviewed with the patient.  Questions have been answered.  All parties agreeable. ? ? ?Birder Robson, MD  05/21/2021, 9:43 AM ? ? ?

## 2021-05-22 ENCOUNTER — Encounter: Payer: Self-pay | Admitting: Ophthalmology

## 2021-06-07 ENCOUNTER — Ambulatory Visit: Payer: Self-pay

## 2021-06-07 NOTE — Telephone Encounter (Signed)
  Chief Complaint: information only Symptoms: none Frequency: na Pertinent Negatives: Patient denies na Disposition: '[]'$ ED /'[]'$ Urgent Care (no appt availability in office) / '[]'$ Appointment(In office/virtual)/ '[]'$  [facility]/ '[]'$ Home Care/ '[]'$ Refused Recommended Disposition /'[]'$ [facility]/ '[x]'$  Follow-up with PCP Additional Notes: Pt needed A1C result for eye doctor appointment on Monday - result given from test of 05/06/2021 - A1C 6.9.  Summary: Requesting A1C Reading from 05/06/21   Pts wife is calling to ask what the patient A1C was on 05/06/21 for an eye apt on Monday. Please advise      Reason for Disposition  Health Information question, no triage required and triager able to answer question  Answer Assessment - Initial Assessment Questions 1. REASON FOR CALL or QUESTION: "What is your reason for calling today?" or "How can I best help you?" or "What question do you have that I can help answer?"     Pt needs A1C for eye doctor appt.  Protocols used: Information Only Call - No Triage-A-AH

## 2021-06-18 ENCOUNTER — Other Ambulatory Visit: Payer: Self-pay | Admitting: Internal Medicine

## 2021-06-18 ENCOUNTER — Other Ambulatory Visit: Payer: Self-pay | Admitting: Family Medicine

## 2021-06-18 DIAGNOSIS — E039 Hypothyroidism, unspecified: Secondary | ICD-10-CM

## 2021-06-19 NOTE — Telephone Encounter (Signed)
Requested medications are due for refill today.  no  Requested medications are on the active medications list.  yes  Last refill. 06/18/2021 #102 0 refills  Future visit scheduled.   yes  Notes to clinic.  Pharmacy comment: Script Clarification:ARE THEY TAKING THE EXTRA TWO ON A SPECIFIC DAY?? PLEASE CLARIFY THE SIG.    Requested Prescriptions  Pending Prescriptions Disp Refills   levothyroxine (SYNTHROID) 25 MCG tablet [Pharmacy Med Name: LEVOTHYROXINE 25 MCG TABLET] 102 tablet 0    Sig: TAKE 1 TABLET (25 MCG TOTAL) BY MOUTH DAILY BEFORE BREAKFAST. AND TWO ONCE A WEEK     Endocrinology:  Hypothyroid Agents Passed - 06/18/2021  1:47 PM      Passed - TSH in normal range and within 360 days    TSH  Date Value Ref Range Status  09/10/2020 3.05 0.40 - 4.50 mIU/L Final         Passed - Valid encounter within last 12 months    Recent Outpatient Visits           1 month ago Dyslipidemia associated with type 2 diabetes mellitus [facility])   [facility], Drue Stager, MD   5 months ago Dyslipidemia associated with type 2 diabetes mellitus [facility])   [facility] Steele Sizer, MD   7 months ago Laceration of right lower extremity, subsequent encounter   [place], DO   7 months ago Laceration of right lower leg, initial encounter   [place], DO   9 months ago Dyslipidemia associated with type 2 diabetes mellitus [facility])   [facility] Steele Sizer, MD       Future Appointments             In 2 months Ancil Boozer, Drue Stager, MD [facility], [facility]

## 2021-07-01 DIAGNOSIS — L821 Other seborrheic keratosis: Secondary | ICD-10-CM | POA: Diagnosis not present

## 2021-07-01 DIAGNOSIS — D485 Neoplasm of uncertain behavior of skin: Secondary | ICD-10-CM | POA: Diagnosis not present

## 2021-07-01 DIAGNOSIS — Z08 Encounter for follow-up examination after completed treatment for malignant neoplasm: Secondary | ICD-10-CM | POA: Diagnosis not present

## 2021-07-01 DIAGNOSIS — Z85828 Personal history of other malignant neoplasm of skin: Secondary | ICD-10-CM | POA: Diagnosis not present

## 2021-07-01 DIAGNOSIS — L570 Actinic keratosis: Secondary | ICD-10-CM | POA: Diagnosis not present

## 2021-07-01 DIAGNOSIS — D229 Melanocytic nevi, unspecified: Secondary | ICD-10-CM | POA: Diagnosis not present

## 2021-07-01 DIAGNOSIS — D0439 Carcinoma in situ of skin of other parts of face: Secondary | ICD-10-CM | POA: Diagnosis not present

## 2021-07-29 ENCOUNTER — Other Ambulatory Visit: Payer: Self-pay | Admitting: Family Medicine

## 2021-07-29 DIAGNOSIS — I429 Cardiomyopathy, unspecified: Secondary | ICD-10-CM | POA: Diagnosis not present

## 2021-07-29 DIAGNOSIS — I480 Paroxysmal atrial fibrillation: Secondary | ICD-10-CM | POA: Diagnosis not present

## 2021-07-29 DIAGNOSIS — E1122 Type 2 diabetes mellitus with diabetic chronic kidney disease: Secondary | ICD-10-CM

## 2021-07-29 DIAGNOSIS — R0609 Other forms of dyspnea: Secondary | ICD-10-CM | POA: Diagnosis not present

## 2021-07-29 DIAGNOSIS — Z955 Presence of coronary angioplasty implant and graft: Secondary | ICD-10-CM | POA: Diagnosis not present

## 2021-07-29 DIAGNOSIS — E785 Hyperlipidemia, unspecified: Secondary | ICD-10-CM | POA: Diagnosis not present

## 2021-08-05 DIAGNOSIS — N200 Calculus of kidney: Secondary | ICD-10-CM | POA: Diagnosis not present

## 2021-08-05 DIAGNOSIS — Z125 Encounter for screening for malignant neoplasm of prostate: Secondary | ICD-10-CM | POA: Diagnosis not present

## 2021-08-05 DIAGNOSIS — N401 Enlarged prostate with lower urinary tract symptoms: Secondary | ICD-10-CM | POA: Diagnosis not present

## 2021-09-02 ENCOUNTER — Other Ambulatory Visit: Payer: Self-pay | Admitting: Family Medicine
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
## 2021-09-09 ENCOUNTER — Ambulatory Visit (INDEPENDENT_AMBULATORY_CARE_PROVIDER_SITE_OTHER): Payer: Medicare Other | Admitting: Family Medicine

## 2021-09-09 ENCOUNTER — Encounter: Payer: Self-pay | Admitting: Family Medicine

## 2021-09-09 ENCOUNTER — Other Ambulatory Visit: Payer: Self-pay

## 2021-09-09 VITALS — BP 124/76 | HR 73 | Resp 16 | Ht 67.0 in | Wt 156.0 lb

## 2021-09-09 DIAGNOSIS — E1169 Type 2 diabetes mellitus with other specified complication: Secondary | ICD-10-CM

## 2021-09-09 DIAGNOSIS — M109 Gout, unspecified: Secondary | ICD-10-CM | POA: Diagnosis not present

## 2021-09-09 DIAGNOSIS — D692 Other nonthrombocytopenic purpura: Secondary | ICD-10-CM | POA: Diagnosis not present

## 2021-09-09 DIAGNOSIS — I5020 Unspecified systolic (congestive) heart failure: Secondary | ICD-10-CM | POA: Diagnosis not present

## 2021-09-09 DIAGNOSIS — N1831 Chronic kidney disease, stage 3a: Secondary | ICD-10-CM

## 2021-09-09 DIAGNOSIS — E559 Vitamin D deficiency, unspecified: Secondary | ICD-10-CM | POA: Diagnosis not present

## 2021-09-09 DIAGNOSIS — I700 Atherosclerosis of aorta: Secondary | ICD-10-CM | POA: Diagnosis not present

## 2021-09-09 DIAGNOSIS — E785 Hyperlipidemia, unspecified: Secondary | ICD-10-CM

## 2021-09-09 DIAGNOSIS — G2581 Restless legs syndrome: Secondary | ICD-10-CM | POA: Insufficient documentation

## 2021-09-09 DIAGNOSIS — I129 Hypertensive chronic kidney disease with stage 1 through stage 4 chronic kidney disease, or unspecified chronic kidney disease: Secondary | ICD-10-CM | POA: Insufficient documentation

## 2021-09-09 DIAGNOSIS — E039 Hypothyroidism, unspecified: Secondary | ICD-10-CM | POA: Diagnosis not present

## 2021-09-09 DIAGNOSIS — N183 Chronic kidney disease, stage 3 unspecified: Secondary | ICD-10-CM

## 2021-09-09 DIAGNOSIS — I4891 Unspecified atrial fibrillation: Secondary | ICD-10-CM

## 2021-09-09 DIAGNOSIS — E1121 Type 2 diabetes mellitus with diabetic nephropathy: Secondary | ICD-10-CM

## 2021-09-09 DIAGNOSIS — E1122 Type 2 diabetes mellitus with diabetic chronic kidney disease: Secondary | ICD-10-CM

## 2021-09-09 LAB — POCT GLYCOSYLATED HEMOGLOBIN (HGB A1C): Hemoglobin A1C: 6.8 % — AB (ref 4.0–5.6)

## 2021-09-09 MED ORDER — BLOOD GLUCOSE METER KIT: 1.0000 | PACK | 1 refills | Status: DC

## 2021-09-10 ENCOUNTER — Other Ambulatory Visit: Payer: Self-pay | Admitting: Family Medicine

## 2021-09-10 DIAGNOSIS — I700 Atherosclerosis of aorta: Secondary | ICD-10-CM

## 2021-09-10 DIAGNOSIS — E039 Hypothyroidism, unspecified: Secondary | ICD-10-CM

## 2021-09-10 DIAGNOSIS — E1169 Type 2 diabetes mellitus with other specified complication: Secondary | ICD-10-CM

## 2021-09-10 LAB — CBC WITH DIFFERENTIAL/PLATELET
Absolute Monocytes: 580 cells/uL (ref 200–950)
Basophils Absolute: 50 cells/uL (ref 0–200)
Basophils Relative: 0.8 %
Eosinophils Absolute: 258 cells/uL (ref 15–500)
Eosinophils Relative: 4.1 %
HCT: 37.1 % — ABNORMAL LOW (ref 38.5–50.0)
Hemoglobin: 12.3 g/dL — ABNORMAL LOW (ref 13.2–17.1)
Lymphs Abs: 1405 cells/uL (ref 850–3900)
MCH: 32.2 pg (ref 27.0–33.0)
MCHC: 33.2 g/dL (ref 32.0–36.0)
MCV: 97.1 fL (ref 80.0–100.0)
MPV: 9.1 fL (ref 7.5–12.5)
Monocytes Relative: 9.2 %
Neutro Abs: 4007 cells/uL (ref 1500–7800)
Neutrophils Relative %: 63.6 %
Platelets: 218 10*3/uL (ref 140–400)
RBC: 3.82 10*6/uL — ABNORMAL LOW (ref 4.20–5.80)
RDW: 12.1 % (ref 11.0–15.0)
Total Lymphocyte: 22.3 %
WBC: 6.3 10*3/uL (ref 3.8–10.8)

## 2021-09-10 LAB — COMPLETE METABOLIC PANEL WITH GFR
AG Ratio: 1.6 (calc) (ref 1.0–2.5)
ALT: 19 U/L (ref 9–46)
AST: 22 U/L (ref 10–35)
Albumin: 4.5 g/dL (ref 3.6–5.1)
Alkaline phosphatase (APISO): 77 U/L (ref 35–144)
BUN/Creatinine Ratio: 20 (calc) (ref 6–22)
BUN: 25 mg/dL (ref 7–25)
CO2: 28 mmol/L (ref 20–32)
Calcium: 10.2 mg/dL (ref 8.6–10.3)
Chloride: 103 mmol/L (ref 98–110)
Creat: 1.24 mg/dL — ABNORMAL HIGH (ref 0.70–1.22)
Globulin: 2.9 g/dL (calc) (ref 1.9–3.7)
Glucose, Bld: 146 mg/dL — ABNORMAL HIGH (ref 65–99)
Potassium: 4.9 mmol/L (ref 3.5–5.3)
Sodium: 141 mmol/L (ref 135–146)
Total Bilirubin: 0.6 mg/dL (ref 0.2–1.2)
Total Protein: 7.4 g/dL (ref 6.1–8.1)
eGFR: 59 mL/min/{1.73_m2} — ABNORMAL LOW (ref >=60)

## 2021-09-10 LAB — LIPID PANEL
Cholesterol: 114 mg/dL (ref <200)
HDL: 56 mg/dL (ref >=40)
LDL Cholesterol (Calc): 44 mg/dL (calc)
Non-HDL Cholesterol (Calc): 58 mg/dL (calc) (ref <130)
Total CHOL/HDL Ratio: 2 (calc) (ref <5.0)
Triglycerides: 67 mg/dL (ref <150)

## 2021-09-10 LAB — VITAMIN D 25 HYDROXY (VIT D DEFICIENCY, FRACTURES): Vit D, 25-Hydroxy: 25 ng/mL — ABNORMAL LOW (ref 30–100)

## 2021-09-10 LAB — MICROALBUMIN / CREATININE URINE RATIO
Creatinine, Urine: 91 mg/dL (ref 20–320)
Microalb Creat Ratio: 16 mcg/mg creat (ref <30)
Microalb, Ur: 1.5 mg/dL

## 2021-09-10 LAB — URIC ACID: Uric Acid, Serum: 5.7 mg/dL (ref 4.0–8.0)

## 2021-09-10 LAB — TSH: TSH: 2.64 mIU/L (ref 0.40–4.50)

## 2021-09-10 MED ORDER — LEVOTHYROXINE SODIUM 25 MCG PO TABS: 25.0000 ug | ORAL_TABLET | Freq: Every day | ORAL | 1 refills | Status: DC

## 2021-09-10 MED ORDER — ATORVASTATIN CALCIUM 10 MG PO TABS: 10.0000 mg | ORAL_TABLET | Freq: Every day | ORAL | 1 refills | Status: DC

## 2021-09-17 DIAGNOSIS — D0439 Carcinoma in situ of skin of other parts of face: Secondary | ICD-10-CM | POA: Diagnosis not present

## 2021-10-11 ENCOUNTER — Other Ambulatory Visit: Payer: Self-pay | Admitting: Family Medicine

## 2021-10-11 DIAGNOSIS — E039 Hypothyroidism, unspecified: Secondary | ICD-10-CM

## 2021-10-15 ENCOUNTER — Other Ambulatory Visit: Payer: Self-pay

## 2021-10-15 DIAGNOSIS — E1169 Type 2 diabetes mellitus with other specified complication: Secondary | ICD-10-CM

## 2021-10-15 DIAGNOSIS — I129 Hypertensive chronic kidney disease with stage 1 through stage 4 chronic kidney disease, or unspecified chronic kidney disease: Secondary | ICD-10-CM

## 2021-10-15 MED ORDER — ONETOUCH DELICA PLUS LANCET33G MISC: 1 refills | Status: DC

## 2021-12-02 DIAGNOSIS — E1122 Type 2 diabetes mellitus with diabetic chronic kidney disease: Secondary | ICD-10-CM | POA: Diagnosis not present

## 2021-12-02 DIAGNOSIS — I10 Essential (primary) hypertension: Secondary | ICD-10-CM | POA: Diagnosis not present

## 2021-12-02 DIAGNOSIS — I480 Paroxysmal atrial fibrillation: Secondary | ICD-10-CM | POA: Diagnosis not present

## 2021-12-02 DIAGNOSIS — I429 Cardiomyopathy, unspecified: Secondary | ICD-10-CM | POA: Diagnosis not present

## 2021-12-02 DIAGNOSIS — I700 Atherosclerosis of aorta: Secondary | ICD-10-CM | POA: Diagnosis not present

## 2021-12-02 DIAGNOSIS — I5020 Unspecified systolic (congestive) heart failure: Secondary | ICD-10-CM | POA: Diagnosis not present

## 2021-12-02 DIAGNOSIS — R0609 Other forms of dyspnea: Secondary | ICD-10-CM | POA: Diagnosis not present

## 2021-12-02 DIAGNOSIS — Z955 Presence of coronary angioplasty implant and graft: Secondary | ICD-10-CM | POA: Diagnosis not present

## 2021-12-02 DIAGNOSIS — E785 Hyperlipidemia, unspecified: Secondary | ICD-10-CM | POA: Diagnosis not present

## 2021-12-02 DIAGNOSIS — N183 Chronic kidney disease, stage 3 unspecified: Secondary | ICD-10-CM | POA: Diagnosis not present

## 2021-12-02 DIAGNOSIS — R001 Bradycardia, unspecified: Secondary | ICD-10-CM | POA: Diagnosis not present

## 2021-12-02 DIAGNOSIS — K219 Gastro-esophageal reflux disease without esophagitis: Secondary | ICD-10-CM | POA: Diagnosis not present

## 2021-12-22 ENCOUNTER — Other Ambulatory Visit: Payer: Self-pay | Admitting: Family Medicine

## 2021-12-22 DIAGNOSIS — E1169 Type 2 diabetes mellitus with other specified complication: Secondary | ICD-10-CM

## 2021-12-23 DIAGNOSIS — Z03818 Encounter for observation for suspected exposure to other biological agents ruled out: Secondary | ICD-10-CM | POA: Diagnosis not present

## 2021-12-23 DIAGNOSIS — R053 Chronic cough: Secondary | ICD-10-CM | POA: Diagnosis not present

## 2021-12-23 DIAGNOSIS — J40 Bronchitis, not specified as acute or chronic: Secondary | ICD-10-CM | POA: Diagnosis not present

## 2022-01-06 DIAGNOSIS — D485 Neoplasm of uncertain behavior of skin: Secondary | ICD-10-CM | POA: Diagnosis not present

## 2022-01-06 DIAGNOSIS — D2261 Melanocytic nevi of right upper limb, including shoulder: Secondary | ICD-10-CM | POA: Diagnosis not present

## 2022-01-06 DIAGNOSIS — Z961 Presence of intraocular lens: Secondary | ICD-10-CM | POA: Diagnosis not present

## 2022-01-06 DIAGNOSIS — L570 Actinic keratosis: Secondary | ICD-10-CM | POA: Diagnosis not present

## 2022-01-06 DIAGNOSIS — D0461 Carcinoma in situ of skin of right upper limb, including shoulder: Secondary | ICD-10-CM | POA: Diagnosis not present

## 2022-01-06 DIAGNOSIS — E119 Type 2 diabetes mellitus without complications: Secondary | ICD-10-CM | POA: Diagnosis not present

## 2022-01-06 DIAGNOSIS — D2262 Melanocytic nevi of left upper limb, including shoulder: Secondary | ICD-10-CM | POA: Diagnosis not present

## 2022-01-06 DIAGNOSIS — D0439 Carcinoma in situ of skin of other parts of face: Secondary | ICD-10-CM | POA: Diagnosis not present

## 2022-01-06 DIAGNOSIS — H02403 Unspecified ptosis of bilateral eyelids: Secondary | ICD-10-CM | POA: Diagnosis not present

## 2022-01-06 DIAGNOSIS — D2272 Melanocytic nevi of left lower limb, including hip: Secondary | ICD-10-CM | POA: Diagnosis not present

## 2022-01-06 DIAGNOSIS — Z85828 Personal history of other malignant neoplasm of skin: Secondary | ICD-10-CM | POA: Diagnosis not present

## 2022-01-06 LAB — HM DIABETES EYE EXAM
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
## 2022-01-10 ENCOUNTER — Ambulatory Visit (INDEPENDENT_AMBULATORY_CARE_PROVIDER_SITE_OTHER): Payer: Medicare Other | Admitting: Family Medicine

## 2022-01-10 ENCOUNTER — Encounter: Payer: Self-pay | Admitting: Family Medicine

## 2022-01-10 VITALS — BP 116/70 | HR 82 | Temp 97.5°F | Resp 16 | Ht 67.0 in | Wt 159.1 lb

## 2022-01-10 DIAGNOSIS — I5020 Unspecified systolic (congestive) heart failure: Secondary | ICD-10-CM

## 2022-01-10 DIAGNOSIS — M109 Gout, unspecified: Secondary | ICD-10-CM

## 2022-01-10 DIAGNOSIS — G2581 Restless legs syndrome: Secondary | ICD-10-CM | POA: Diagnosis not present

## 2022-01-10 DIAGNOSIS — E1169 Type 2 diabetes mellitus with other specified complication: Secondary | ICD-10-CM | POA: Diagnosis not present

## 2022-01-10 DIAGNOSIS — I5022 Chronic systolic (congestive) heart failure: Secondary | ICD-10-CM | POA: Diagnosis not present

## 2022-01-10 DIAGNOSIS — D692 Other nonthrombocytopenic purpura: Secondary | ICD-10-CM | POA: Diagnosis not present

## 2022-01-10 DIAGNOSIS — N1831 Chronic kidney disease, stage 3a: Secondary | ICD-10-CM

## 2022-01-10 DIAGNOSIS — I700 Atherosclerosis of aorta: Secondary | ICD-10-CM

## 2022-01-10 DIAGNOSIS — E559 Vitamin D deficiency, unspecified: Secondary | ICD-10-CM | POA: Diagnosis not present

## 2022-01-10 DIAGNOSIS — E039 Hypothyroidism, unspecified: Secondary | ICD-10-CM

## 2022-01-10 DIAGNOSIS — E785 Hyperlipidemia, unspecified: Secondary | ICD-10-CM

## 2022-01-10 DIAGNOSIS — M160 Bilateral primary osteoarthritis of hip: Secondary | ICD-10-CM | POA: Diagnosis not present

## 2022-01-10 DIAGNOSIS — I129 Hypertensive chronic kidney disease with stage 1 through stage 4 chronic kidney disease, or unspecified chronic kidney disease: Secondary | ICD-10-CM

## 2022-01-10 DIAGNOSIS — E1122 Type 2 diabetes mellitus with diabetic chronic kidney disease: Secondary | ICD-10-CM | POA: Diagnosis not present

## 2022-01-10 LAB — POCT GLYCOSYLATED HEMOGLOBIN (HGB A1C): Hemoglobin A1C: 7.6 % — AB (ref 4.0–5.6)

## 2022-01-10 MED ORDER — METFORMIN HCL ER 500 MG PO TB24: ORAL_TABLET | ORAL | 1 refills | Status: DC

## 2022-01-10 MED ORDER — DAPAGLIFLOZIN PROPANEDIOL 10 MG PO TABS: 10.0000 mg | ORAL_TABLET | Freq: Every day | ORAL | 0 refills | Status: DC

## 2022-01-10 MED ORDER — ROPINIROLE HCL 1 MG PO TABS: 1.0000 mg | ORAL_TABLET | Freq: Every day | ORAL | 1 refills | Status: DC

## 2022-03-12 ENCOUNTER — Other Ambulatory Visit: Payer: Self-pay | Admitting: Family Medicine

## 2022-03-12 DIAGNOSIS — E785 Hyperlipidemia, unspecified: Secondary | ICD-10-CM

## 2022-03-12 DIAGNOSIS — I700 Atherosclerosis of aorta: Secondary | ICD-10-CM

## 2022-04-15 ENCOUNTER — Other Ambulatory Visit: Payer: Self-pay | Admitting: Family Medicine

## 2022-04-15 DIAGNOSIS — M5416 Radiculopathy, lumbar region: Secondary | ICD-10-CM

## 2022-04-28 ENCOUNTER — Ambulatory Visit
Admission: RE | Admit: 2022-04-28 | Discharge: 2022-04-28 | Disposition: A | Discharge status: Home / Self Care | Payer: Medicare Other | Source: Ambulatory Visit | Attending: Family Medicine | Admitting: Family Medicine

## 2022-04-28 DIAGNOSIS — M5416 Radiculopathy, lumbar region: Secondary | ICD-10-CM
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
## 2022-05-12 ENCOUNTER — Encounter: Payer: Self-pay | Admitting: Family Medicine

## 2022-05-12 ENCOUNTER — Ambulatory Visit (INDEPENDENT_AMBULATORY_CARE_PROVIDER_SITE_OTHER): Payer: Medicare Other | Admitting: Family Medicine

## 2022-05-12 VITALS — BP 114/70 | HR 72 | Temp 97.9°F | Resp 14 | Ht 67.0 in | Wt 158.4 lb

## 2022-05-12 DIAGNOSIS — E039 Hypothyroidism, unspecified: Secondary | ICD-10-CM

## 2022-05-12 DIAGNOSIS — I700 Atherosclerosis of aorta: Secondary | ICD-10-CM

## 2022-05-12 DIAGNOSIS — E1122 Type 2 diabetes mellitus with diabetic chronic kidney disease: Secondary | ICD-10-CM

## 2022-05-12 DIAGNOSIS — E785 Hyperlipidemia, unspecified: Secondary | ICD-10-CM

## 2022-05-12 DIAGNOSIS — D692 Other nonthrombocytopenic purpura: Secondary | ICD-10-CM

## 2022-05-12 DIAGNOSIS — E1169 Type 2 diabetes mellitus with other specified complication: Secondary | ICD-10-CM

## 2022-05-12 DIAGNOSIS — I4891 Unspecified atrial fibrillation: Secondary | ICD-10-CM

## 2022-05-12 DIAGNOSIS — I5022 Chronic systolic (congestive) heart failure: Secondary | ICD-10-CM

## 2022-05-12 DIAGNOSIS — I129 Hypertensive chronic kidney disease with stage 1 through stage 4 chronic kidney disease, or unspecified chronic kidney disease: Secondary | ICD-10-CM

## 2022-05-12 DIAGNOSIS — G2581 Restless legs syndrome: Secondary | ICD-10-CM

## 2022-05-12 DIAGNOSIS — K219 Gastro-esophageal reflux disease without esophagitis: Secondary | ICD-10-CM

## 2022-05-12 DIAGNOSIS — N1831 Chronic kidney disease, stage 3a: Secondary | ICD-10-CM

## 2022-05-12 LAB — POCT GLYCOSYLATED HEMOGLOBIN (HGB A1C): Hemoglobin A1C: 7.6 % — AB (ref 4.0–5.6)

## 2022-05-12 MED ORDER — LEVOTHYROXINE SODIUM 25 MCG PO TABS: 25.0000 ug | ORAL_TABLET | Freq: Every day | ORAL | 1 refills | Status: DC

## 2022-05-12 MED ORDER — ROPINIROLE HCL 1 MG PO TABS
1.0000 mg | ORAL_TABLET | Freq: Two times a day (BID) | ORAL | 0 refills | Status: DC
Start: 2022-05-12 — End: 2022-09-15

## 2022-05-12 MED ORDER — ATORVASTATIN CALCIUM 10 MG PO TABS
10.0000 mg | ORAL_TABLET | Freq: Every day | ORAL | 1 refills | Status: DC
Start: 2022-05-12 — End: 2022-09-15

## 2022-05-12 MED ORDER — METFORMIN HCL ER 750 MG PO TB24
1500.0000 mg | ORAL_TABLET | Freq: Every day | ORAL | 1 refills | Status: DC
Start: 2022-05-12 — End: 2022-09-15

## 2022-05-12 MED ORDER — PANTOPRAZOLE SODIUM 20 MG PO TBEC
20.0000 mg | DELAYED_RELEASE_TABLET | ORAL | 1 refills | Status: DC
Start: 2022-05-12 — End: 2022-09-15

## 2022-05-12 MED ORDER — ONETOUCH DELICA PLUS LANCET33G MISC
1 refills | Status: DC
Start: 2022-05-12 — End: 2022-09-19

## 2022-05-12 MED ORDER — ONETOUCH ULTRA VI STRP
ORAL_STRIP | 3 refills | Status: DC
Start: 2022-05-12 — End: 2023-05-26

## 2022-08-11 ENCOUNTER — Ambulatory Visit: Payer: Medicare Other | Admitting: Urology

## 2022-08-11 ENCOUNTER — Encounter: Payer: Self-pay | Admitting: Urology

## 2022-08-11 VITALS — BP 123/70 | HR 74 | Ht 67.0 in | Wt 148.0 lb

## 2022-08-11 DIAGNOSIS — N401 Enlarged prostate with lower urinary tract symptoms: Secondary | ICD-10-CM | POA: Diagnosis not present

## 2022-08-11 DIAGNOSIS — N200 Calculus of kidney: Secondary | ICD-10-CM | POA: Diagnosis not present

## 2022-08-11 DIAGNOSIS — Z125 Encounter for screening for malignant neoplasm of prostate: Secondary | ICD-10-CM

## 2022-08-11 NOTE — Progress Notes (Signed)
I, Billy Franco,acting as a scribe for Billy Altes, MD.,have documented all relevant documentation on the behalf of Billy Altes, MD,as directed by  Billy Altes, MD while in the presence of Billy Altes, MD.  08/11/2022 10:48 AM   Billy Franco April 03, 1940 [id number]  Referring provider: Alba Cory, MD [address] [place],  [place] [address]  Chief Complaint  Patient presents with   Other    HPI: Billy Franco is a 82 y.o. male presents to transfer urologic care.   He presents today with his wife.  Previously followed by Dr. Evelene Croon for BPH and recurrent nephrolithiasis.  Last saw Dr. Evelene Croon July 2023 and was taking tamsulosin 0.4 mg daily for BPH. History of recurrent stone disease, mixed calcium oxalate/uric acid. On KUB last year, he had 1.5 cm and 7 mm left renal calculi, which were nonobstructing. Since last year's visit with Dr. Evelene Croon, he has been asymptomatic Denies flank, abdominal, or pelvic pain. No bothersome LUTS on tamsulosin.  He has taken a supplement TheraLith XR, for several years which Dr. Evelene Croon was ordering, and he desires to continue. He was continuing to get annual PSA, however his last PSA July 2023 was 0.2   PMH: Past Medical History:  Diagnosis Date   Arthritis    Lower back, hips   Atrial fibrillation (HCC)    CHF (congestive heart failure) (HCC)    CKD (chronic kidney disease), stage III (HCC)    Complication of anesthesia    has "crooked airway"  Diff breathing after extubated (after kidney stone procedure 07/2013)   Diabetes mellitus without complication (HCC)    GERD (gastroesophageal reflux disease)    Hypertension    Hypothyroidism    Nephrolithiasis     Surgical History: Past Surgical History:  Procedure Laterality Date   CATARACT EXTRACTION W/PHACO Right 05/07/2021   Procedure: CATARACT EXTRACTION PHACO AND INTRAOCULAR LENS PLACEMENT (IOC) RIGHT DIABETIC 8.63 00:56.5;  Surgeon: Billy Manila, MD;   Location: [facility];  Service: Ophthalmology;  Laterality: Right;  Diabetic   CATARACT EXTRACTION W/PHACO Left 05/21/2021   Procedure: CATARACT EXTRACTION PHACO AND INTRAOCULAR LENS PLACEMENT (IOC) LEFT DIABETIC 7.27 01:01.5;  Surgeon: Billy Manila, MD;  Location: [facility];  Service: Ophthalmology;  Laterality: Left;  Diabetic   CORONARY STENT INTERVENTION N/A 03/08/2020   Procedure: CORONARY STENT INTERVENTION;  Surgeon: Billy Pea, MD;  Location: [facility] INVASIVE CV LAB;  Service: Cardiovascular;  Laterality: N/A;   LEFT HEART CATH AND CORONARY ANGIOGRAPHY Left 03/08/2020   Procedure: LEFT HEART CATH AND CORONARY ANGIOGRAPHY;  Surgeon: Billy Pea, MD;  Location: [facility] INVAS[facility] CV LAB;  Service: Cardiovascular;  Laterality: Left;   NASAL FRACTURE SURGERY  1994   URETEROLITHOTOMY Right 2015    Home Medications:  Allergies as of 08/11/2022       Reactions   Codeine Rash, Other (See Comments)   Per pt "hard on my kidneys"    Lovastatin Rash, Other (See Comments)   Tape Rash        Medication List        Accurate as of August 11, 2022 10:48 AM. If you have any questions, ask your nurse or doctor.          acetaminophen 650 MG CR tablet Commonly known as: TYLENOL Take 650 mg by mouth every 8 (eight) hours as needed for pain.   atorvastatin 10 MG tablet Commonly known as: LIPITOR Take 1 tablet (10 mg total)  by mouth daily at 6 PM.   B-12 1000 MCG Tbcr Take 1,000 mcg by mouth daily.   blood glucose meter kit and supplies 1 each by Other route as directed. Dispense based on patient and insurance preference. Use up to four times daily as directed. (FOR ICD-10 E10.9, E11.9).   carvedilol 3.125 MG tablet Commonly known as: COREG Take 1 tablet (3.125 mg total) by mouth 2 (two) times daily with a meal.   clopidogrel 75 MG tablet Commonly known as: PLAVIX Take 1 tablet (75 mg total) by mouth daily with breakfast for 30 doses.   docusate sodium  50 MG capsule Commonly known as: COLACE Take 100 mg by mouth 2 (two) times daily as needed for mild constipation.   fexofenadine-pseudoephedrine 180-240 MG 24 hr tablet Commonly known as: ALLEGRA-D 24 Take 1 tablet by mouth daily as needed (allergies).   furosemide 20 MG tablet Commonly known as: LASIX Take 20 mg by mouth daily.   levothyroxine 25 MCG tablet Commonly known as: SYNTHROID Take 1 tablet (25 mcg total) by mouth daily before breakfast. On Sundays, take 2 tablets by mouth before breakfast.   losartan 25 MG tablet Commonly known as: COZAAR Take 1 tablet (25 mg total) by mouth at bedtime.   magnesium oxide 400 MG tablet Commonly known as: MAG-OX Take 400 mg by mouth daily.   metaxalone 800 MG tablet Commonly known as: SKELAXIN May take 1/2-1 whole tablet up to 2 times daily as needed for pain/spasm.   metFORMIN 750 MG 24 hr tablet Commonly known as: GLUCOPHAGE-XR Take 2 tablets (1,500 mg total) by mouth daily with breakfast.   OneTouch Delica Plus Lancet33G Misc 1 EACH DAILY AT 12 NOON. DX: E11.65, E78.5,   OneTouch Ultra test strip Generic drug: glucose blood USE AS DIRECTED TO CHECK BLOOD GLUCOSE ONCE DAILY.   pantoprazole 20 MG tablet Commonly known as: PROTONIX Take 1 tablet (20 mg total) by mouth every other day.   rOPINIRole 1 MG tablet Commonly known as: REQUIP Take 1 tablet (1 mg total) by mouth 2 (two) times daily.   tamsulosin 0.4 MG Caps capsule Commonly known as: FLOMAX Take 0.4 mg by mouth every evening.   THERALITH XR PO Take by mouth QID. 3.752-45-45-49.5mg . Take 2 in the evening and 2 at bedtime.   triamcinolone cream 0.1 % Commonly known as: KENALOG Apply 1 application topically 2 (two) times daily as needed (rash).        Allergies:  Allergies  Allergen Reactions   Codeine Rash and Other (See Comments)    Per pt "hard on my kidneys"     Lovastatin Rash and Other (See Comments)   Tape Rash    Family History: Family  History  Problem Relation Age of Onset   Heart disease Father     Social History:  reports that he has never smoked. He has never used smokeless tobacco. He reports that he does not drink alcohol and does not use drugs.   Physical Exam: BP 123/70   Pulse 74   Ht 5\' 7"  (1.702 m)   Wt 148 lb (67.1 kg)   BMI 23.18 kg/m   Constitutional:  Alert and oriented, No acute distress. HEENT: [place] AT Respiratory: Normal respiratory effort, no increased work of breathing. Psychiatric: Normal mood and affect.   Assessment & Plan:    1. BPH with LUTS Stable on tamsulosin; he did not need refills at this time.   2. Left nephrolithiasis KUB was ordered, and we'll call  with results.  He may continue TheraLith.  3. Prostate cancer screening Prostate cancer screening is no longer recommended after the age of 70 with healthy patients extending to age 58. His last PSA a year ago was 0.2, and the incidence of clinically significant prostate cancer is unlikely. Would recommend his continuing prostate cancer screening.  I have reviewed the above documentation for accuracy and completeness, and I agree with the above.   Billy Altes, MD  [facility] [address], [address] [place], [place] [address] [phone]

## 2022-09-06 ENCOUNTER — Other Ambulatory Visit: Payer: Self-pay | Admitting: Family Medicine

## 2022-09-06 DIAGNOSIS — I700 Atherosclerosis of aorta: Secondary | ICD-10-CM

## 2022-09-06 DIAGNOSIS — E1169 Type 2 diabetes mellitus with other specified complication: Secondary | ICD-10-CM

## 2022-09-08 ENCOUNTER — Other Ambulatory Visit: Payer: Self-pay | Admitting: *Deleted

## 2022-09-08 ENCOUNTER — Telehealth: Payer: Self-pay | Admitting: Urology

## 2022-09-08 MED ORDER — TAMSULOSIN HCL 0.4 MG PO CAPS: 0.4000 mg | ORAL_CAPSULE | Freq: Every evening | ORAL | 2 refills | Status: DC

## 2022-09-08 NOTE — Telephone Encounter (Signed)
Patient was seen by Dr. Lonna Cobb on 08/11/22 (was former Dr. Evelene Croon patient). He is taking Tamsulosin 0.4 mg capsule, and his wife said this was discussed with Dr. Lonna Cobb, but refill was not sent in. She is requesting refill be sent to [facility] in [place]. She is requesting a 90 day supply.

## 2022-09-08 NOTE — Telephone Encounter (Signed)
Medication sent to [facility].

## 2022-09-15 ENCOUNTER — Ambulatory Visit
Admission: RE | Admit: 2022-09-15 | Discharge: 2022-09-15 | Disposition: A | Discharge status: Home / Self Care | Payer: Medicare Other | Source: Ambulatory Visit | Attending: Urology | Admitting: Urology

## 2022-09-15 ENCOUNTER — Encounter: Payer: Self-pay | Admitting: Family Medicine

## 2022-09-15 ENCOUNTER — Ambulatory Visit (INDEPENDENT_AMBULATORY_CARE_PROVIDER_SITE_OTHER): Payer: Medicare Other | Admitting: Family Medicine

## 2022-09-15 VITALS — BP 102/60 | HR 64 | Temp 97.7°F | Resp 16 | Ht 67.0 in | Wt 149.1 lb

## 2022-09-15 DIAGNOSIS — E1169 Type 2 diabetes mellitus with other specified complication: Secondary | ICD-10-CM

## 2022-09-15 DIAGNOSIS — R1033 Periumbilical pain: Secondary | ICD-10-CM

## 2022-09-15 DIAGNOSIS — N200 Calculus of kidney: Secondary | ICD-10-CM | POA: Insufficient documentation

## 2022-09-15 DIAGNOSIS — E1122 Type 2 diabetes mellitus with diabetic chronic kidney disease: Secondary | ICD-10-CM | POA: Diagnosis not present

## 2022-09-15 DIAGNOSIS — E11628 Type 2 diabetes mellitus with other skin complications: Secondary | ICD-10-CM

## 2022-09-15 DIAGNOSIS — I4891 Unspecified atrial fibrillation: Secondary | ICD-10-CM

## 2022-09-15 DIAGNOSIS — I5022 Chronic systolic (congestive) heart failure: Secondary | ICD-10-CM

## 2022-09-15 DIAGNOSIS — I129 Hypertensive chronic kidney disease with stage 1 through stage 4 chronic kidney disease, or unspecified chronic kidney disease: Secondary | ICD-10-CM

## 2022-09-15 DIAGNOSIS — E441 Mild protein-calorie malnutrition: Secondary | ICD-10-CM

## 2022-09-15 DIAGNOSIS — R6881 Early satiety: Secondary | ICD-10-CM

## 2022-09-15 DIAGNOSIS — G2581 Restless legs syndrome: Secondary | ICD-10-CM

## 2022-09-15 DIAGNOSIS — N1831 Chronic kidney disease, stage 3a: Secondary | ICD-10-CM

## 2022-09-15 DIAGNOSIS — I700 Atherosclerosis of aorta: Secondary | ICD-10-CM

## 2022-09-15 DIAGNOSIS — E039 Hypothyroidism, unspecified: Secondary | ICD-10-CM

## 2022-09-15 DIAGNOSIS — E785 Hyperlipidemia, unspecified: Secondary | ICD-10-CM | POA: Diagnosis not present

## 2022-09-15 DIAGNOSIS — D692 Other nonthrombocytopenic purpura: Secondary | ICD-10-CM

## 2022-09-15 DIAGNOSIS — K219 Gastro-esophageal reflux disease without esophagitis: Secondary | ICD-10-CM

## 2022-09-15 LAB — POCT GLYCOSYLATED HEMOGLOBIN (HGB A1C): Hemoglobin A1C: 7.3 % — AB (ref 4.0–5.6)

## 2022-09-15 MED ORDER — PANTOPRAZOLE SODIUM 40 MG PO TBEC: 40.0000 mg | DELAYED_RELEASE_TABLET | Freq: Every day | ORAL | 0 refills | Status: DC

## 2022-09-15 MED ORDER — ATORVASTATIN CALCIUM 10 MG PO TABS
10.0000 mg | ORAL_TABLET | Freq: Every day | ORAL | 1 refills | Status: DC
Start: 2022-09-15 — End: 2023-02-02

## 2022-09-15 MED ORDER — ROPINIROLE HCL 1 MG PO TABS: 1.0000 mg | ORAL_TABLET | Freq: Two times a day (BID) | ORAL | 1 refills | Status: DC

## 2022-09-15 MED ORDER — METFORMIN HCL ER 750 MG PO TB24
1500.0000 mg | ORAL_TABLET | Freq: Every day | ORAL | 1 refills | Status: DC
Start: 2022-09-15 — End: 2023-02-02

## 2022-09-15 MED ORDER — LEVOTHYROXINE SODIUM 25 MCG PO TABS
25.0000 ug | ORAL_TABLET | Freq: Every day | ORAL | 1 refills | Status: DC
Start: 2022-09-15 — End: 2023-02-02
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
## 2022-09-16 LAB — CBC WITH DIFFERENTIAL/PLATELET
Absolute Monocytes: 776 {cells}/uL (ref 200–950)
Basophils Absolute: 39 {cells}/uL (ref 0–200)
Basophils Relative: 0.7 %
Eosinophils Absolute: 259 {cells}/uL (ref 15–500)
Eosinophils Relative: 4.7 %
HCT: 37 % — ABNORMAL LOW (ref 38.5–50.0)
Hemoglobin: 12.3 g/dL — ABNORMAL LOW (ref 13.2–17.1)
Lymphs Abs: 1122 {cells}/uL (ref 850–3900)
MCH: 32.5 pg (ref 27.0–33.0)
MCHC: 33.2 g/dL (ref 32.0–36.0)
MCV: 97.9 fL (ref 80.0–100.0)
MPV: 9.5 fL (ref 7.5–12.5)
Monocytes Relative: 14.1 %
Neutro Abs: 3306 {cells}/uL (ref 1500–7800)
Neutrophils Relative %: 60.1 %
Platelets: 228 10*3/uL (ref 140–400)
RBC: 3.78 10*6/uL — ABNORMAL LOW (ref 4.20–5.80)
RDW: 12.2 % (ref 11.0–15.0)
Total Lymphocyte: 20.4 %
WBC: 5.5 10*3/uL (ref 3.8–10.8)

## 2022-09-16 LAB — COMPLETE METABOLIC PANEL WITH GFR
AG Ratio: 1.7 (calc) (ref 1.0–2.5)
ALT: 17 U/L (ref 9–46)
AST: 19 U/L (ref 10–35)
Albumin: 4.5 g/dL (ref 3.6–5.1)
Alkaline phosphatase (APISO): 67 U/L (ref 35–144)
BUN/Creatinine Ratio: 18 (calc) (ref 6–22)
BUN: 24 mg/dL (ref 7–25)
CO2: 30 mmol/L (ref 20–32)
Calcium: 10.3 mg/dL (ref 8.6–10.3)
Chloride: 100 mmol/L (ref 98–110)
Creat: 1.3 mg/dL — ABNORMAL HIGH (ref 0.70–1.22)
Globulin: 2.7 g/dL (ref 1.9–3.7)
Glucose, Bld: 165 mg/dL — ABNORMAL HIGH (ref 65–99)
Potassium: 4.5 mmol/L (ref 3.5–5.3)
Sodium: 139 mmol/L (ref 135–146)
Total Bilirubin: 0.7 mg/dL (ref 0.2–1.2)
Total Protein: 7.2 g/dL (ref 6.1–8.1)
eGFR: 55 mL/min/{1.73_m2} — ABNORMAL LOW (ref >=60)

## 2022-09-16 LAB — LIPID PANEL
Cholesterol: 124 mg/dL (ref <200)
HDL: 59 mg/dL (ref >=40)
LDL Cholesterol (Calc): 48 mg/dL
Non-HDL Cholesterol (Calc): 65 mg/dL (calc) (ref <130)
Total CHOL/HDL Ratio: 2.1 (calc) (ref <5.0)
Triglycerides: 88 mg/dL (ref <150)

## 2022-09-16 LAB — MICROALBUMIN / CREATININE URINE RATIO
Creatinine, Urine: 113 mg/dL (ref 20–320)
Microalb Creat Ratio: 13 mg/g{creat} (ref <30)
Microalb, Ur: 1.5 mg/dL

## 2022-09-16 LAB — TSH: TSH: 1.26 mIU/L (ref 0.40–4.50)

## 2022-09-16 LAB — VITAMIN D 25 HYDROXY (VIT D DEFICIENCY, FRACTURES): Vit D, 25-Hydroxy: 25 ng/mL — ABNORMAL LOW (ref 30–100)

## 2022-09-19 ENCOUNTER — Other Ambulatory Visit: Payer: Self-pay | Admitting: Family Medicine

## 2022-09-19 DIAGNOSIS — N1831 Chronic kidney disease, stage 3a: Secondary | ICD-10-CM

## 2022-09-19 DIAGNOSIS — E1169 Type 2 diabetes mellitus with other specified complication: Secondary | ICD-10-CM

## 2022-09-19 DIAGNOSIS — I129 Hypertensive chronic kidney disease with stage 1 through stage 4 chronic kidney disease, or unspecified chronic kidney disease: Secondary | ICD-10-CM

## 2022-09-19 NOTE — Telephone Encounter (Signed)
Requested Prescriptions  Pending Prescriptions Disp Refills   Lancets (ONETOUCH DELICA PLUS LANCET33G) MISC [Pharmacy Med Name: ONE TOUCH DELICA PLUS 33G LANC] 100 each 1    Sig: 1 EACH DAILY AT 12 NOON.     Endocrinology: Diabetes - Testing Supplies Passed - 09/19/2022  4:12 PM      Passed - Valid encounter within last 12 months    Recent Outpatient Visits           4 days ago Dyslipidemia associated with type 2 diabetes mellitus [facility])   [facility] Alba Cory, MD   4 months ago Dyslipidemia associated with type 2 diabetes mellitus [facility])   [place] [facility] Alba Cory, MD   8 months ago Dyslipidemia associated with type 2 diabetes mellitus [facility])   [facility] Alba Cory, MD   1 year ago Dyslipidemia associated with type 2 diabetes mellitus [facility]    [facility] Alba Cory, MD   1 year ago Dyslipidemia associated with type 2 diabetes mellitus [facility])    [facility] Alba Cory, MD       Future Appointments             In 4 months Carlynn Purl, Danna Hefty, MD [facility]   In 11 months Stoioff, Verna Czech, MD [facility] [place]

## 2022-09-23 ENCOUNTER — Other Ambulatory Visit: Payer: Self-pay | Admitting: *Deleted

## 2022-09-23 DIAGNOSIS — N200 Calculus of kidney: Secondary | ICD-10-CM

## 2022-10-02 ENCOUNTER — Ambulatory Visit
Admission: RE | Admit: 2022-10-02 | Discharge: 2022-10-02 | Disposition: A | Discharge status: Home / Self Care | Payer: Medicare Other | Source: Ambulatory Visit | Attending: Urology | Admitting: Urology

## 2022-10-02 DIAGNOSIS — N200 Calculus of kidney: Secondary | ICD-10-CM | POA: Diagnosis present

## 2022-10-06 ENCOUNTER — Encounter: Payer: Self-pay | Admitting: Physician Assistant

## 2022-10-06 ENCOUNTER — Ambulatory Visit: Payer: Medicare Other | Admitting: Physician Assistant

## 2022-10-06 VITALS — BP 90/44 | HR 54 | Temp 97.5°F | Ht 67.0 in | Wt 150.2 lb

## 2022-10-06 DIAGNOSIS — R1013 Epigastric pain: Secondary | ICD-10-CM | POA: Diagnosis not present

## 2022-10-06 DIAGNOSIS — R1084 Generalized abdominal pain: Secondary | ICD-10-CM

## 2022-10-06 DIAGNOSIS — K5904 Chronic idiopathic constipation: Secondary | ICD-10-CM

## 2022-10-06 DIAGNOSIS — R634 Abnormal weight loss: Secondary | ICD-10-CM

## 2022-10-06 DIAGNOSIS — R6881 Early satiety: Secondary | ICD-10-CM | POA: Diagnosis not present

## 2022-10-06 NOTE — Patient Instructions (Addendum)
Upper GI series scheduled @ [facility] on  10/15/22 @ 8:30  Arrival-Nothing to eat/drink 6 hours prior.    CT abdomen/Pelvis scheduled @ [facility] on 10/08/22 @ 11:00 am.

## 2022-10-06 NOTE — Progress Notes (Signed)
Billy Amy, PA-C [address]  [address]  [place], [place] [address]  Main: [phone]  Fax: [phone]   Gastroenterology Consultation  Referring Provider:     Alba Cory, MD Primary Care Physician:  Alba Cory, MD Primary Gastroenterologist:  Billy Amy, PA-C / Dr. Wyline Mood   Reason for Consultation:     Early satiety, Constipaiton, GERD        HPI:   Billy Franco is a 82 y.o. y/o male referred for consultation & management  by Alba Cory, MD.    He is referred to evaluate periumbilical abdominal pain, early satiety, weight loss, and GERD.  He has been on Prilosec for many years.  He weaned his Prilosec dose down to 20 mg every other day and had increased GI symptoms on the lower dose.  His PCP recently increased his omeprazole back up to 40 mg every day.  Upper GI symptoms have improved on higher dose PPI.  He is here today with his wife who helps with his care.  Patient admits to moderate constipation.  Recent abdominal x-ray showed: Was full of stool, consistent with constipation.  He has recently drank prune juice and ate prunes with some benefit.  Has not tried any prescription or OTC treatment for constipation.  He denies rectal bleeding.  He states his weight is down 10 pounds in the past 6 months.  He states he gets full very quickly after eating.  Has a lot of abdominal bloating which improves after passing gas or bowel movement.  He denies heartburn or dysphagia.  Patient reports having EGD and colonoscopy at [facility] clinic by Dr. Mechele Collin over 10 years ago.  Records are not available.  His wife states they were not able to get the upper endoscope down his throat due to a defect in his throat/esophagus.  Colonoscopy was reportedly normal.  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  His wife states he has a "crooked airway".  He  had difficulty breathing after extubation after kidney stone procedure.  Last abdominal pelvic CT without and with contrast 01/2020 showed bilateral renal calculi, diverticulosis but no diverticulitis, pericardial effusion, extensive lumbar arthropathy, aortic atherosclerosis.  Labs 09/15/2022: Hemoglobin 12.3, MCV 97, platelets 228, creatinine 1.3, BUN 24, GFR 55, glucose 165, normal LFTs, normal TSH.  Past Medical History:  Diagnosis Date   Arthritis    Lower back, hips   Atrial fibrillation (HCC)    CHF (congestive heart failure) (HCC)    CKD (chronic kidney disease), stage III (HCC)    Complication of anesthesia    has "crooked airway"  Diff breathing after extubated (after kidney stone procedure 07/2013)   Diabetes mellitus without complication (HCC)    GERD (gastroesophageal reflux disease)    Hypertension    Hypothyroidism    Nephrolithiasis     Past Surgical History:  Procedure Laterality Date   CATARACT EXTRACTION W/PHACO Right 05/07/2021   Procedure: CATARACT EXTRACTION PHACO AND INTRAOCULAR LENS PLACEMENT (IOC) RIGHT DIABETIC 8.63 00:56.5;  Surgeon: Galen Manila, MD;  Location: [facility];  Service: Ophthalmology;  Laterality: Right;  Diabetic   CATARACT EXTRACTION W/PHACO Left 05/21/2021   Procedure: CATARACT EXTRACTION PHACO AND INTRAOCULAR LENS PLACEMENT (IOC) LEFT DIABETIC 7.27 01:01.5;  Surgeon: Galen Manila, MD;  Location: [facility];  Service: Ophthalmology;  Laterality: Left;  Diabetic   CORONARY STENT INTERVENTION N/A 03/08/2020   Procedure:  CORONARY STENT INTERVENTION;  Surgeon: Alwyn Pea, MD;  Location: [facility] INVASIVE CV LAB;  Service: Cardiovascular;  Laterality: N/A;   LEFT HEART CATH AND CORONARY ANGIOGRAPHY Left 03/08/2020   Procedure: LEFT HEART CATH AND CORONARY ANGIOGRAPHY;  Surgeon: Alwyn Pea, MD;  Location: [facility] INVASIVE CV LAB;  Service: Cardiovascular;  Laterality: Left;   NASAL FRACTURE SURGERY  1994    URETEROLITHOTOMY Right 2015    Prior to Admission medications   Medication Sig Start Date End Date Taking? Authorizing Provider  acetaminophen (TYLENOL) 650 MG CR tablet Take 650 mg by mouth every 8 (eight) hours as needed for pain.    [provider]  atorvastatin (LIPITOR) 10 MG tablet Take 1 tablet (10 mg total) by mouth daily at 6 PM. 09/15/22   Alba Cory, MD  blood glucose meter kit and supplies 1 each by Other route as directed. Dispense based on patient and insurance preference. Use up to four times daily as directed. (FOR ICD-10 E10.9, E11.9). 09/09/21   Alba Cory, MD  carvedilol (COREG) 3.125 MG tablet Take 1 tablet (3.125 mg total) by mouth 2 (two) times daily with a meal. 12/25/19 05/21/21  Pieter Partridge, MD  clopidogrel (PLAVIX) 75 MG tablet Take 1 tablet (75 mg total) by mouth daily with breakfast for 30 doses. 03/09/20 05/21/21  Callwood, Dwayne D, MD  Cyanocobalamin (B-12) 1000 MCG TBCR Take 1,000 mcg by mouth daily. 01/02/20   [provider]  docusate sodium (COLACE) 50 MG capsule Take 100 mg by mouth 2 (two) times daily as needed for mild constipation.    [provider]  fexofenadine-pseudoephedrine (ALLEGRA-D 24) 180-240 MG 24 hr tablet Take 1 tablet by mouth daily as needed (allergies).    [provider]  furosemide (LASIX) 20 MG tablet Take 20 mg by mouth daily. 01/02/20   Callwood, Dwayne D, MD  glucose blood (ONETOUCH ULTRA) test strip USE AS DIRECTED TO CHECK BLOOD GLUCOSE ONCE DAILY. 05/12/22   Alba Cory, MD  Lancets (ONETOUCH DELICA PLUS LANCET33G) MISC 1 EACH DAILY AT 12 NOON. 09/19/22   Alba Cory, MD  levothyroxine (SYNTHROID) 25 MCG tablet Take 1 tablet (25 mcg total) by mouth daily before breakfast. On Sundays, take 2 tablets by mouth before breakfast. 09/15/22   Alba Cory, MD  losartan (COZAAR) 25 MG tablet Take 1 tablet (25 mg total) by mouth at bedtime. 12/25/19   Pieter Partridge, MD   magnesium oxide (MAG-OX) 400 MG tablet Take 400 mg by mouth daily.    [provider]  metaxalone (SKELAXIN) 800 MG tablet May take 1/2-1 whole tablet up to 2 times daily as needed for pain/spasm. 04/14/22   [provider]  metFORMIN (GLUCOPHAGE-XR) 750 MG 24 hr tablet Take 2 tablets (1,500 mg total) by mouth daily with breakfast. 09/15/22   Alba Cory, MD  Nutritional Supplements (THERALITH XR PO) Take by mouth QID. 3.752-45-45-49.5mg . Take 2 in the evening and 2 at bedtime.    [provider]  pantoprazole (PROTONIX) 40 MG tablet Take 1 tablet (40 mg total) by mouth daily. 09/15/22   Alba Cory, MD  rOPINIRole (REQUIP) 1 MG tablet Take 1 tablet (1 mg total) by mouth 2 (two) times daily. 09/15/22   Alba Cory, MD  tamsulosin (FLOMAX) 0.4 MG CAPS capsule Take 1 capsule (0.4 mg total) by mouth every evening. 09/08/22   Stoioff, Verna Czech, MD  triamcinolone (KENALOG) 0.1 % Apply 1 application topically 2 (two) times daily as needed (rash).  [provider]    Family History  Problem Relation Age of Onset   Heart disease Father      Social History   Tobacco Use   Smoking status: Never   Smokeless tobacco: Never  Vaping Use   Vaping status: Never Used  Substance Use Topics   Alcohol use: No    Alcohol/week: 0.0 standard drinks of alcohol   Drug use: No    Allergies as of 10/06/2022 - Review Complete 10/06/2022  Allergen Reaction Noted   Codeine Rash and Other (See Comments) 07/31/2014   Lovastatin Rash and Other (See Comments) 07/31/2014   Tape Rash 05/01/2021    Review of Systems:    All systems reviewed and negative except where noted in HPI.   Physical Exam:  BP (!) 90/44   Pulse (!) 54   Temp (!) 97.5 F (36.4 C)   Ht 5\' 7"  (1.702 m)   Wt 150 lb 3.2 oz (68.1 kg)   BMI 23.52 kg/m  No LMP for male patient.  General:   Alert,  Well-developed, well-nourished, pleasant and cooperative in NAD Lungs:  Respirations even and  unlabored.  Clear throughout to auscultation.   No wheezes, crackles, or rhonchi. No acute distress. Heart:  Regular rate and rhythm; no murmurs, clicks, rubs, or gallops. Abdomen:  Normal bowel sounds.  No bruits.  Soft, and non-distended without masses, hepatosplenomegaly or hernias noted.  No Tenderness.  No guarding or rebound tenderness.    Neurologic:  Alert and oriented x3;  grossly normal neurologically. Psych:  Alert and cooperative. Normal mood and affect.  Imaging Studies: US RENAL  Result Date: 10/03/2022 CLINICAL DATA:  Nephrolithiasis EXAM: RENAL / URINARY TRACT ULTRASOUND COMPLETE COMPARISON:  Abdominal radiograph 09/15/2022 FINDINGS: Right Kidney: Renal measurements: 8.9 x 4.4 x 3.7 cm = volume: 74.5 mL. Echogenicity within normal limits. No mass or hydronephrosis visualized. Left Kidney: Renal measurements: 9.6 x 4.5 x 4.2 cm = volume: 94.1 mL. Echogenicity within normal limits. No mass or hydronephrosis visualized. Bladder: Appears normal for degree of bladder distention. Other: None. IMPRESSION: No hydronephrosis. Electronically Signed   By: Annia Belt M.D.   On: 10/03/2022 22:27   Abdomen 1 view (KUB)  Result Date: 09/20/2022 CLINICAL DATA:  Kidney stones. EXAM: ABDOMEN - 1 VIEW COMPARISON:  04/15/2012. FINDINGS: Unremarkable bowel gas pattern. Possible stone overlying the left kidney which could be confirmed with noncontrast CT. Extensive enthesopathy noted as well as flowing syndesmophyte formation of the spine and bilateral SI joint ankylosis which are findings consistent with ankylosing spondylitis. Also extensive bilateral hip degenerative changes with joint space narrowing and osteophytes. IMPRESSION: 1. Possible left-sided nephrolithiasis. Consider noncontrast CT for confirmation. 2. 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 Electronically Signed   By: Layla Maw M.D.   On: 09/20/2022 13:46    Assessment and Plan:   Zalan Henningsen  is a 82 y.o. y/o male has been referred for:  Chronic Constipation  Start OTC Miralax, mix 1-2 capfuls in a drink once daily every day.  High-fiber diet with fruits, vegetables, whole grains.  Drink 64 ounces of water daily.  EpiGastric pain  Schedule upper GI series for evaluation.  Generalized abdominal pain  Abdominal pelvic CT with contrast  Early satiety Upper GI series  Weight loss Abdominal pelvic CT with contrast  6.  Comorbidities: CAD, CHF with EF 30%, atrial fibrillation on Eliquis, advanced age.  I am concerned that he is at increased risk for EGD or colonoscopy given his  age and comorbidities.  We discussed risk and benefits of EGD and colonoscopy at length.  I am starting his workup with a CT and upper GI series.  Pending these results, we can decide if EGD and/or colonoscopy are needed.  Patient declined to schedule EGD and colonoscopy today.   Follow up 4 weeks with TG.  Billy Amy, PA-C

## 2022-10-08 ENCOUNTER — Ambulatory Visit
Admission: RE | Admit: 2022-10-08 | Discharge: 2022-10-08 | Disposition: A | Discharge status: Home / Self Care | Payer: Medicare Other | Source: Ambulatory Visit | Attending: Physician Assistant | Admitting: Physician Assistant

## 2022-10-08 DIAGNOSIS — R1084 Generalized abdominal pain: Secondary | ICD-10-CM | POA: Insufficient documentation

## 2022-10-08 MED ORDER — IOHEXOL 300 MG/ML  SOLN
100.0000 mL | Freq: Once | INTRAMUSCULAR | Status: AC | PRN
  Administered 2022-10-08: 100 mL via INTRAVENOUS

## 2022-10-15 ENCOUNTER — Ambulatory Visit
Admission: RE | Admit: 2022-10-15 | Discharge: 2022-10-15 | Disposition: A | Discharge status: Home / Self Care | Payer: Medicare Other | Source: Ambulatory Visit | Attending: Physician Assistant | Admitting: Physician Assistant

## 2022-10-15 ENCOUNTER — Telehealth: Payer: Self-pay

## 2022-10-15 DIAGNOSIS — R1084 Generalized abdominal pain: Secondary | ICD-10-CM | POA: Diagnosis present

## 2022-10-15 NOTE — Telephone Encounter (Signed)
Patient notified. Call and notify patient upper GI series showed: 1.  Prominent bony projections (osteophytes) in the cervical spine which are pressing on the posterior esophagus. 2.  Moderate esophageal dysmotility -abnormal muscle contractions in the esophagus. 3.  Otherwise normal.  No evidence of hiatal hernia, GERD, esophageal masses or strictures.  Nothing worrisome.  No evidence of upper GI cancer. **Recommend continue omeprazole 40 mg once daily to help with esophageal dysmotility.  Recommend chew food well, eat small frequent meals slowly, eat sitting upright, do not eat 2 or 3 hours before you lay down for bed.  I do not believe EGD is necessary.  Continue with current plan. Celso Amy, PA-C

## 2022-10-15 NOTE — Progress Notes (Signed)
Call and notify patient upper GI series showed: 1.  Prominent bony projections (osteophytes) in the cervical spine which are pressing on the posterior esophagus. 2.  Moderate esophageal dysmotility -abnormal muscle contractions in the esophagus. 3.  Otherwise normal.  No evidence of hiatal hernia, GERD, esophageal masses or strictures.  Nothing worrisome.  No evidence of upper GI cancer. **Recommend continue omeprazole 40 mg once daily to help with esophageal dysmotility.  Recommend chew food well, eat small frequent meals slowly, eat sitting upright, do not eat 2 or 3 hours before you lay down for bed.  I do not believe EGD is necessary.  Continue with current plan. Celso Amy, PA-C

## 2022-10-28 ENCOUNTER — Telehealth: Payer: Self-pay

## 2022-10-28 NOTE — Telephone Encounter (Signed)
Patient notified.   Call and notify patient abdominal pelvic CT shows:  1.  Minimal pericardial effusion, stable and not changed from previous imaging in 2022.  2.  Mild scar tissue in the right lower lung, not worrisome.  3.  Left kidney stone with no obstruction.  4.  Diverticulosis but NO evidence of diverticulitis.  5.  Bilateral inguinal hernias.  No evidence of bowel obstruction or incarceration.  6.  No gallstones.  Normal liver, pancreas, and intestines.  No GI abnormality.  No evidence of cancer or masses in the abdomen or pelvis.  **I recommend continue MiraLAX 1 or 2 capfuls daily for constipation.  Continue current GERD medication (Prilosec 40 Mg daily).

## 2022-10-28 NOTE — Progress Notes (Signed)
Call and notify patient abdominal pelvic CT shows: 1.  Minimal pericardial effusion, stable and not changed from previous imaging in 2022. 2.  Mild scar tissue in the right lower lung, not worrisome. 3.  Left kidney stone with no obstruction. 4.  Diverticulosis but NO evidence of diverticulitis. 5.  Bilateral inguinal hernias.  No evidence of bowel obstruction or incarceration. 6.  No gallstones.  Normal liver, pancreas, and intestines.  No GI abnormality.  No evidence of cancer or masses in the abdomen or pelvis. **I recommend continue MiraLAX 1 or 2 capfuls daily for constipation.  Continue current GERD medication (Prilosec 40 Mg daily).

## 2022-11-02 ENCOUNTER — Other Ambulatory Visit: Payer: Self-pay | Admitting: Family Medicine

## 2022-11-02 DIAGNOSIS — K219 Gastro-esophageal reflux disease without esophagitis: Secondary | ICD-10-CM

## 2022-11-04 ENCOUNTER — Other Ambulatory Visit: Payer: Self-pay

## 2022-11-10 ENCOUNTER — Ambulatory Visit: Payer: Medicare Other | Admitting: Physician Assistant

## 2022-11-10 ENCOUNTER — Encounter: Payer: Self-pay | Admitting: Physician Assistant

## 2022-11-10 VITALS — BP 90/45 | HR 55 | Temp 97.7°F | Ht 67.0 in | Wt 149.6 lb

## 2022-11-10 DIAGNOSIS — K219 Gastro-esophageal reflux disease without esophagitis: Secondary | ICD-10-CM

## 2022-11-10 DIAGNOSIS — K224 Dyskinesia of esophagus: Secondary | ICD-10-CM

## 2022-11-10 DIAGNOSIS — D649 Anemia, unspecified: Secondary | ICD-10-CM

## 2022-11-10 DIAGNOSIS — K5909 Other constipation: Secondary | ICD-10-CM | POA: Diagnosis not present

## 2022-11-10 DIAGNOSIS — R1013 Epigastric pain: Secondary | ICD-10-CM | POA: Diagnosis not present

## 2022-11-10 DIAGNOSIS — K5904 Chronic idiopathic constipation: Secondary | ICD-10-CM

## 2022-11-10 DIAGNOSIS — R634 Abnormal weight loss: Secondary | ICD-10-CM

## 2022-11-10 MED ORDER — PANTOPRAZOLE SODIUM 40 MG PO TBEC: 40.0000 mg | DELAYED_RELEASE_TABLET | Freq: Every day | ORAL | 3 refills | Status: DC

## 2022-11-10 MED ORDER — POLYETHYLENE GLYCOL 3350 17 GM/SCOOP PO POWD: 17.0000 g | Freq: Every day | ORAL | Status: DC

## 2022-11-10 NOTE — Progress Notes (Signed)
Billy Amy, PA-C [address]  [address]  [place], [place] [address]  Main: [phone]  Fax: [phone]   Primary Care Physician: Alba Cory, MD  Primary Gastroenterologist:  Billy Amy, PA-C   CC: Follow-up constipation, epigastric pain, weight loss  HPI: Billy Franco is a 82 y.o. male returns for 1 month follow-up of chronic constipation, epigastric pain, generalized abdominal pain, early satiety, and weight loss.  Multiple comorbidities including CAD, CHF with EF 30%, atrial fibrillation on Eliquis, advanced age.  Started on MiraLAX 1 month ago.  PPI was increased to pantoprazole 40 Mg once daily.  He is feeling a lot better on this treatment.  Appetite has improved and weight is stable.  He needs refill on pantoprazole.  No more abdominal pain.  Currently having bowel movement daily and constipation has improved.  He is here today with his wife.  Labs 09/15/2022: Elevated glucose 165, creatinine 1.30, BUN 24, GFR 55, normal LFTs, hemoglobin 12.3, MCV 97.9, normal TSH.  Abdominal pelvic CT with contrast 10/08/2022 showed small stable pericardial effusion, left kidney nephrolithiasis, diverticulosis, small fat-containing bilateral inguinal hernias without incarceration or obstruction.  No diverticulitis.  No acute abnormality.  No evidence of cancer or masses.  No explanation for weight loss.  Upper GI series 10/15/2022 showed cervical osteophytes, moderate esophageal dysmotility, otherwise unremarkable.  No evidence of hiatal hernia, GERD, esophageal mass or stricture.  Patient states he had a colonoscopy and attempted EGD many years ago.  He had difficulty being sedated in the past and ended up on a ventilator.  He adamantly refuses to schedule any more EGD or colonoscopy procedures.    Current Outpatient Medications  Medication Sig Dispense Refill   acetaminophen (TYLENOL) 650 MG CR tablet Take 650 mg by mouth every 8 (eight) hours as needed for pain.      atorvastatin (LIPITOR) 10 MG tablet Take 1 tablet (10 mg total) by mouth daily at 6 PM. 90 tablet 1   blood glucose meter kit and supplies 1 each by Other route as directed. Dispense based on patient and insurance preference. Use up to four times daily as directed. (FOR ICD-10 E10.9, E11.9). 1 each 1   Cholecalciferol (VITAMIN D-1000 MAX ST) 25 MCG (1000 UT) tablet Take 1 tablet by mouth daily.     Cyanocobalamin (B-12) 1000 MCG TBCR Take 1,000 mcg by mouth daily.     dapagliflozin propanediol (FARXIGA) 10 MG TABS tablet Take by mouth.     docusate sodium (COLACE) 50 MG capsule Take 100 mg by mouth 2 (two) times daily as needed for mild constipation.     fexofenadine-pseudoephedrine (ALLEGRA-D 24) 180-240 MG 24 hr tablet Take 1 tablet by mouth daily as needed (allergies).     furosemide (LASIX) 20 MG tablet Take 20 mg by mouth daily.     glucose blood (ONETOUCH ULTRA) test strip USE AS DIRECTED TO CHECK BLOOD GLUCOSE ONCE DAILY. 100 strip 3   Lancets (ONETOUCH DELICA PLUS LANCET33G) MISC 1 EACH DAILY AT 12 NOON. 100 each 1   levothyroxine (SYNTHROID) 25 MCG tablet Take 1 tablet (25 mcg total) by mouth daily before breakfast. On Sundays, take 2 tablets by mouth before breakfast. 102 tablet 1   losartan (COZAAR) 25 MG tablet Take 1 tablet (25 mg total) by mouth at bedtime. 30 tablet 0   magnesium oxide (MAG-OX) 400 MG tablet Take 400 mg by mouth daily.     metaxalone (SKELAXIN) 800 MG tablet May take 1/2-1 whole tablet up  to 2 times daily as needed for pain/spasm.     metFORMIN (GLUCOPHAGE-XR) 750 MG 24 hr tablet Take 2 tablets (1,500 mg total) by mouth daily with breakfast. 180 tablet 1   Nutritional Supplements (THERALITH XR PO) Take by mouth QID. 3.752-45-45-49.5mg . Take 2 in the evening and 2 at bedtime.     rOPINIRole (REQUIP) 1 MG tablet Take 1 tablet (1 mg total) by mouth 2 (two) times daily. 180 tablet 1   tamsulosin (FLOMAX) 0.4 MG CAPS capsule Take 1 capsule (0.4 mg total) by mouth every  evening. 90 capsule 2   traMADol (ULTRAM) 50 MG tablet Take 50 mg by mouth 2 (two) times daily as needed.     triamcinolone (KENALOG) 0.1 % Apply 1 application topically 2 (two) times daily as needed (rash).     carvedilol (COREG) 3.125 MG tablet Take 1 tablet (3.125 mg total) by mouth 2 (two) times daily with a meal. 60 tablet 0   clopidogrel (PLAVIX) 75 MG tablet Take 1 tablet (75 mg total) by mouth daily with breakfast for 30 doses. 30 tablet 6   pantoprazole (PROTONIX) 40 MG tablet Take 1 tablet (40 mg total) by mouth daily. 90 tablet 3   polyethylene glycol powder (GLYCOLAX/MIRALAX) 17 GM/SCOOP powder Take 17 g by mouth daily.     No current facility-administered medications for this visit.    Allergies as of 11/10/2022 - Review Complete 11/10/2022  Allergen Reaction Noted   Codeine Rash and Other (See Comments) 07/31/2014   Lovastatin Rash and Other (See Comments) 07/31/2014   Tape Rash 05/01/2021    Past Medical History:  Diagnosis Date   Arthritis    Lower back, hips   Atrial fibrillation (HCC)    CHF (congestive heart failure) (HCC)    CKD (chronic kidney disease), stage III (HCC)    Complication of anesthesia    has "crooked airway"  Diff breathing after extubated (after kidney stone procedure 07/2013)   Diabetes mellitus without complication (HCC)    GERD (gastroesophageal reflux disease)    Hypertension    Hypothyroidism    Nephrolithiasis     Past Surgical History:  Procedure Laterality Date   CATARACT EXTRACTION W/PHACO Right 05/07/2021   Procedure: CATARACT EXTRACTION PHACO AND INTRAOCULAR LENS PLACEMENT (IOC) RIGHT DIABETIC 8.63 00:56.5;  Surgeon: Galen Manila, MD;  Location: [facility];  Service: Ophthalmology;  Laterality: Right;  Diabetic   CATARACT EXTRACTION W/PHACO Left 05/21/2021   Procedure: CATARACT EXTRACTION PHACO AND INTRAOCULAR LENS PLACEMENT (IOC) LEFT DIABETIC 7.27 01:01.5;  Surgeon: Galen Manila, MD;  Location: [facility];   Service: Ophthalmology;  Laterality: Left;  Diabetic   CORONARY STENT INTERVENTION N/A 03/08/2020   Procedure: CORONARY STENT INTERVENTION;  Surgeon: Alwyn Pea, MD;  Location: [facility] INV[facility] CV LAB;  Service: Cardiovascular;  Laterality: N/A;   LEFT HEART CATH AND CORONARY ANGIOGRAPHY Left 03/08/2020   Procedure: LEFT HEART CATH AND CORONARY ANGIOGRAPHY;  Surgeon: Alwyn Pea, MD;  Location: [facility] IN[facility] LAB;  Service: Cardiovascular;  Laterality: Left;   NASAL FRACTURE SURGERY  1994   URETEROLITHOTOMY Right 2015    Review of Systems:    All systems reviewed and negative except where noted in HPI.   Physical Examination:   BP (!) 90/45   Pulse (!) 55   Temp 97.7 F (36.5 C)   Ht 5\' 7"  (1.702 m)   Wt 149 lb 9.6 oz (67.9 kg)   BMI 23.43 kg/m   General: Well-nourished, well-developed in no  acute distress.  Lungs: Clear to auscultation bilaterally. Non-labored. Heart: Regular rate and rhythm, no murmurs rubs or gallops.  Abdomen: Bowel sounds are normal; Abdomen is Soft; No hepatosplenomegaly, masses or hernias;  No Abdominal Tenderness; No guarding or rebound tenderness. Neuro: Alert and oriented x 3.  Grossly intact.  Psych: Alert and cooperative, normal mood and affect.   Imaging Studies: DG UGI W SINGLE CM (SOL OR THIN BA)  Result Date: 10/15/2022 CLINICAL DATA:  Provided history: Abdominal pain, generalized. Additional history provided: The patient reports abdominal pain, early satiety, history of GERD, difficulty swallowing tablets. EXAM: DG UGI W SINGLE CM TECHNIQUE: A combined double and single contrast examination was performed using effervescent crystals, high-density barium and thin liquid barium. The exam was performed by Mina Marble, PA-C and was supervised and interpreted by Dr. Jackey Loge. FLUOROSCOPY: Radiation Exposure Index (as provided by the fluoroscopic device): 22.90 mGy Kerma COMPARISON:  CT abdomen/pelvis 10/08/2022. FINDINGS: Fairly bulky  multilevel ventral osteophytes within the cervical spine. An osteophyte at the C6-C7 level mildly effaces the cervical esophagus posteriorly. Otherwise unremarkable caliber and smooth contour of the esophagus. No evidence of fixed stricture, mass or mucosal abnormality. Moderate intermittent esophageal dysmotility with tertiary contractions. No appreciable hiatal hernia. No gastroesophageal reflux observed. The patient swallowed a 13 mm barium tablet, which freely passed into the stomach. Normal appearance of the stomach, duodenal bulb and duodenal sweep. No evidence of ulceration, fold thickening or mass. IMPRESSION: 1. Fairly bulky multilevel ventral osteophytes within the cervical spine. An osteophyte at the C6-C7 level mildly effaces the cervical esophagus posteriorly. 2. Moderate esophageal dysmotility with tertiary contractions. 3. Otherwise unremarkable upper GI series, as described. Electronically Signed   By: Jackey Loge D.O.   On: 10/15/2022 10:08    Assessment and Plan:   Billy Franco is a 82 y.o. y/o male 1 month follow-up of chronic constipation, epigastric pain, generalized abdominal pain, early satiety, and weight loss.  Multiple comorbidities including CAD, CHF with EF 30%, atrial fibrillation on Eliquis, advanced age.  Recent abdominal pelvic CT showed no evidence of cancer, masses, or acute abnormality.  Upper GI series showed esophageal dysmotility, otherwise unremarkable.  I am concerned that he is at high risk for EGD or colonoscopy given his advanced age and comorbidities.  Patient states he had a colonoscopy and attempted EGD many years ago.  He had difficulty being sedated in the past and ended up on a ventilator.  He adamantly refuses to schedule any more EGD or colonoscopy procedures.  1.  Chronic constipation -improved and controlled on MiraLAX  Continue MiraLAX daily  Continue high-fiber diet and 64 ounces of fluids daily  2.  Esophageal dysmotility  I gave patient education  handout from [facility] regarding esophageal dysmotility.  Lengthy discussion with patient and her daughter regarding education and treatment.  Recommend eat small frequent meals.  Eat sitting up.  Try Gaviscon 30 mL after each meal.   3.  GERD / Dyspepsia - Improved on PPI  Continue pantoprazole 40 Mg once daily.  4.  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  Weight Loss - Stable at present  Recommend he drink boost or Ensure daily.  Increase calories in diet.    Billy Amy, PA-C  Follow up as needed based on lab results and GI symptoms.

## 2022-11-11 ENCOUNTER — Telehealth: Payer: Self-pay

## 2022-11-11 LAB — CBC WITH DIFFERENTIAL/PLATELET
Basophils Absolute: 0.1 10*3/uL (ref 0.0–0.2)
Basos: 1 %
EOS (ABSOLUTE): 0.3 10*3/uL (ref 0.0–0.4)
Eos: 5 %
Hematocrit: 35.3 % — ABNORMAL LOW (ref 37.5–51.0)
Hemoglobin: 11.7 g/dL — ABNORMAL LOW (ref 13.0–17.7)
Immature Grans (Abs): 0 10*3/uL (ref 0.0–0.1)
Immature Granulocytes: 0 %
Lymphocytes Absolute: 1 10*3/uL (ref 0.7–3.1)
Lymphs: 19 %
MCH: 32.6 pg (ref 26.6–33.0)
MCHC: 33.1 g/dL (ref 31.5–35.7)
MCV: 98 fL — ABNORMAL HIGH (ref 79–97)
Monocytes Absolute: 0.6 10*3/uL (ref 0.1–0.9)
Monocytes: 11 %
Neutrophils Absolute: 3.3 10*3/uL (ref 1.4–7.0)
Neutrophils: 64 %
Platelets: 239 10*3/uL (ref 150–450)
RBC: 3.59 x10E6/uL — ABNORMAL LOW (ref 4.14–5.80)
RDW: 12.1 % (ref 11.6–15.4)
WBC: 5.2 10*3/uL (ref 3.4–10.8)

## 2022-11-11 LAB — IRON,TIBC AND FERRITIN PANEL
Ferritin: 124 ng/mL (ref 30–400)
Iron Saturation: 24 % (ref 15–55)
Iron: 86 ug/dL (ref 38–169)
Total Iron Binding Capacity: 363 ug/dL (ref 250–450)
UIBC: 277 ug/dL (ref 111–343)

## 2022-11-11 LAB — B12 AND FOLATE PANEL
Folate: 4.1 ng/mL (ref >3.0)
Vitamin B-12: >2000 pg/mL — ABNORMAL HIGH (ref 232–1245)

## 2022-11-11 NOTE — Progress Notes (Signed)
Call and notify patient labs showed stable chronic anemia with hemoglobin 11.7.  Vitamin B12 is elevated, yet not worrisome.  He can decrease vitamin B12 supplement.  Folate and iron levels are normal.  No evidence of iron deficiency anemia.  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  Does not need any further GI workup.  Continue current plan.  PCP can monitor labs in the future.

## 2022-11-11 NOTE — Telephone Encounter (Signed)
Patient notified.   Call and notify patient labs showed stable chronic anemia with hemoglobin 11.7.  Vitamin B12 is elevated, yet not worrisome.  He can decrease vitamin B12 supplement.  Folate and iron levels are normal.  No evidence of iron deficiency anemia.  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  Does not need any further GI workup.  Continue current plan.  PCP can monitor labs in the future.

## 2022-12-19 IMAGING — US US EXTREM LOW VENOUS*L*
1 series · 14 of 24 positions shown · non-contrast
Comparison: None.

CLINICAL DATA: Pain

EXAM:
Left LOWER EXTREMITY VENOUS DOPPLER ULTRASOUND
TECHNIQUE: Gray-scale sonography with compression, as well as color and duplex
ultrasound, were performed to evaluate the deep venous system(s)
from the level of the common femoral vein through the popliteal and
proximal calf veins.

[Series 1: us venous img lower uni left (dvt) · portal-venous · 14 of 33 slices shown]
[im 1/33]
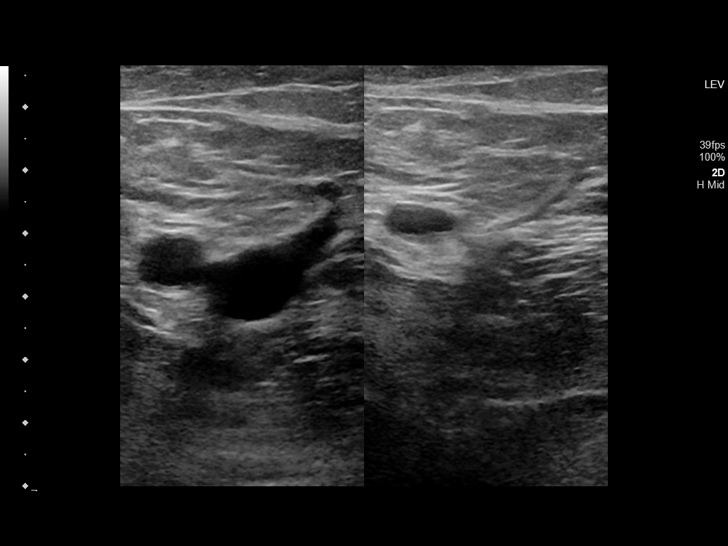
[im 3/33]
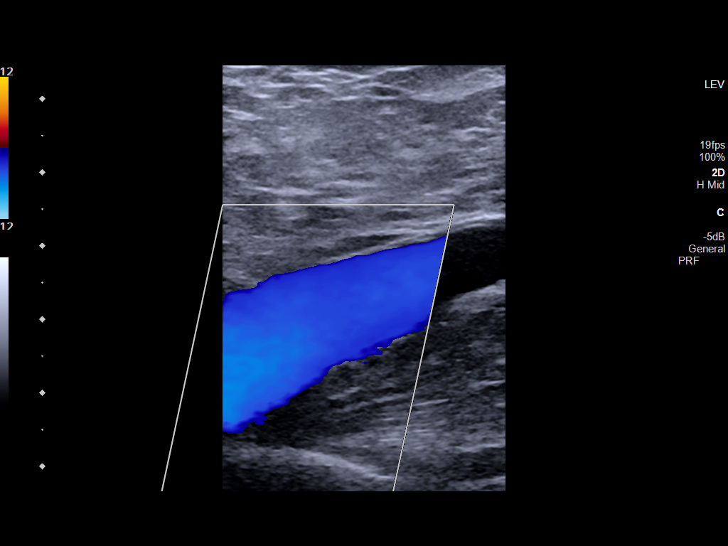
[im 6/33]
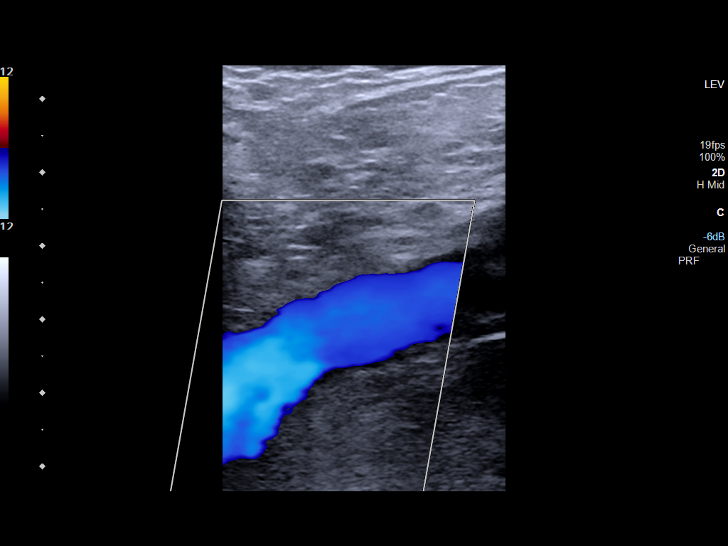
[im 9/33]
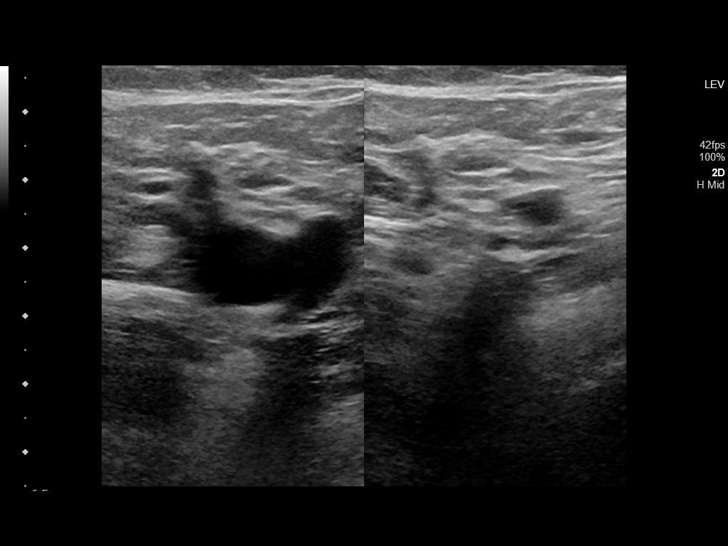
[im 10/33]
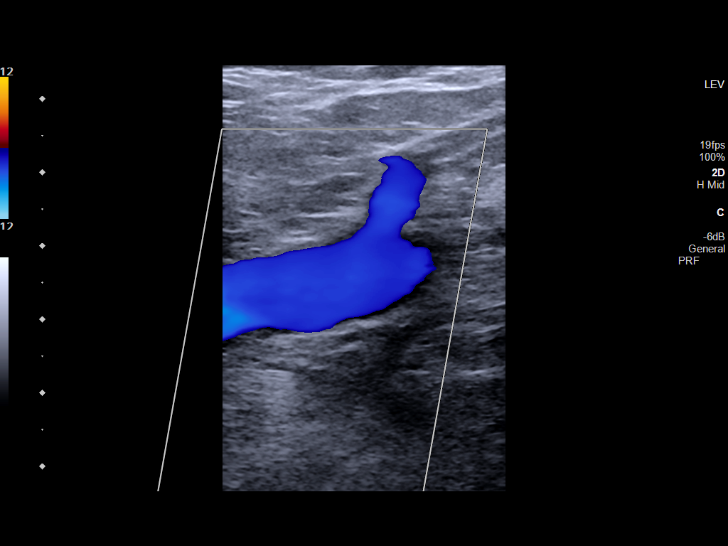
[im 13/33]
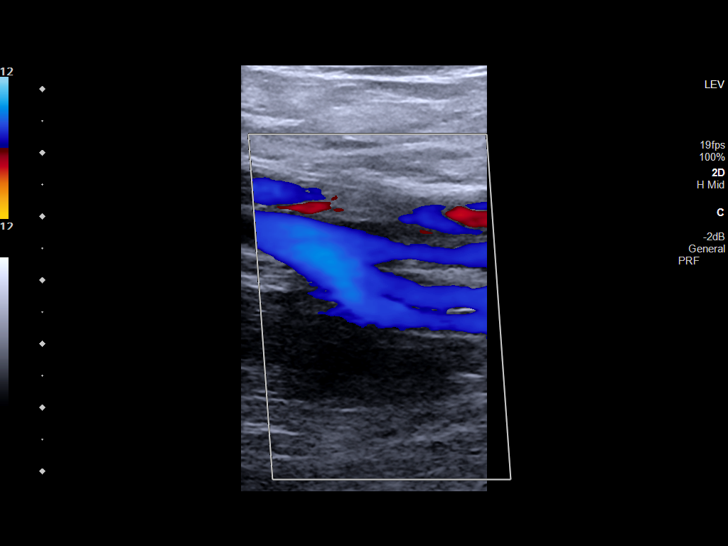
[im 16/33]
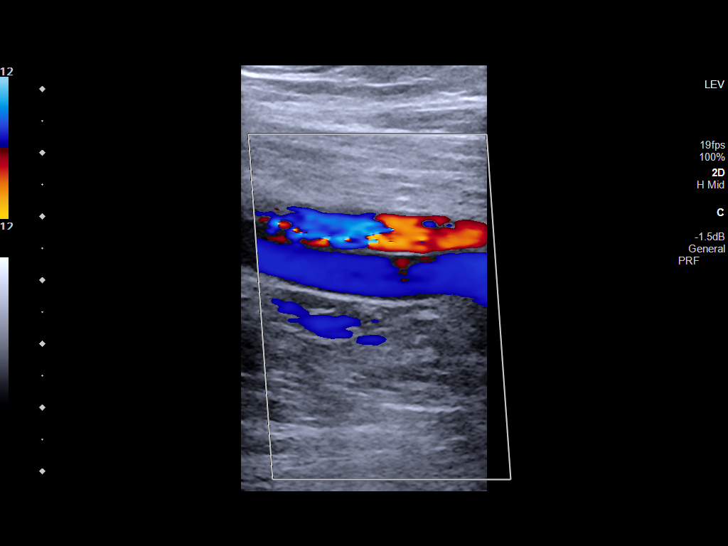
[im 17/33]
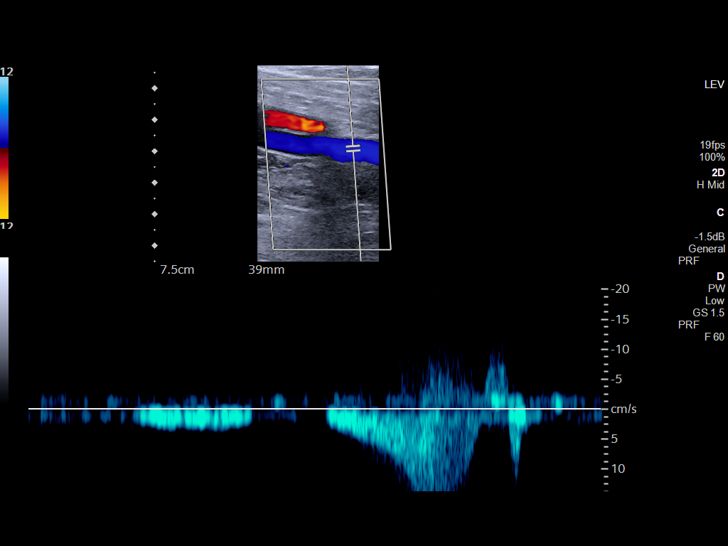
[im 20/33]
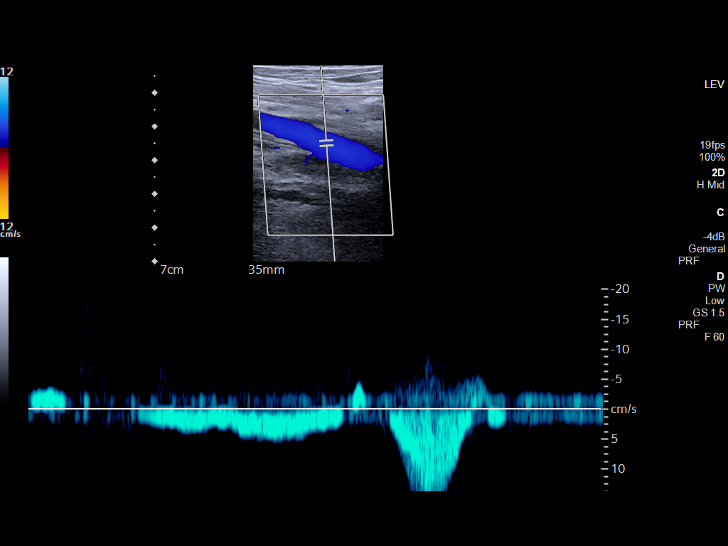
[im 23/33]
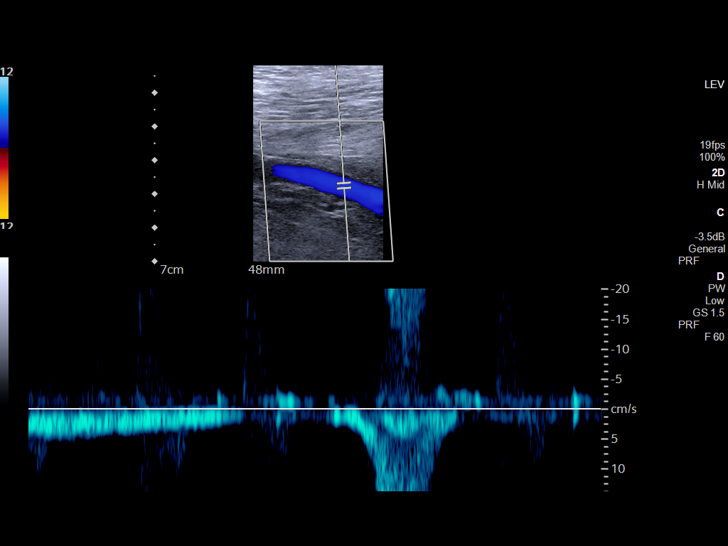
[im 26/33]
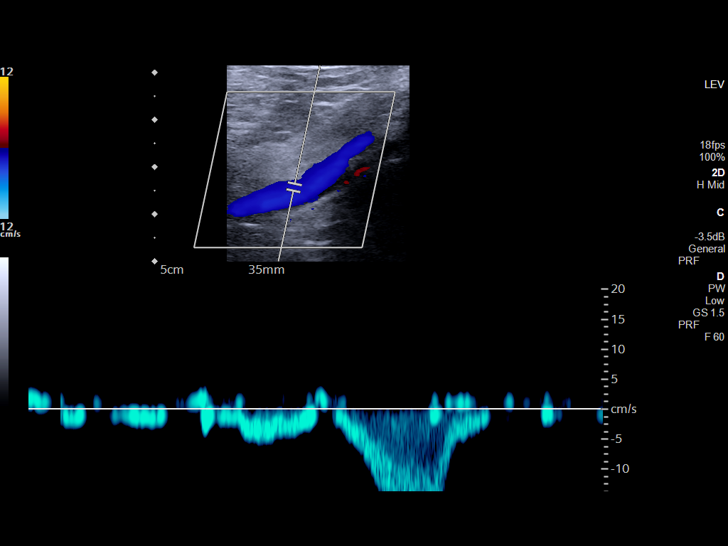
[im 27/33]
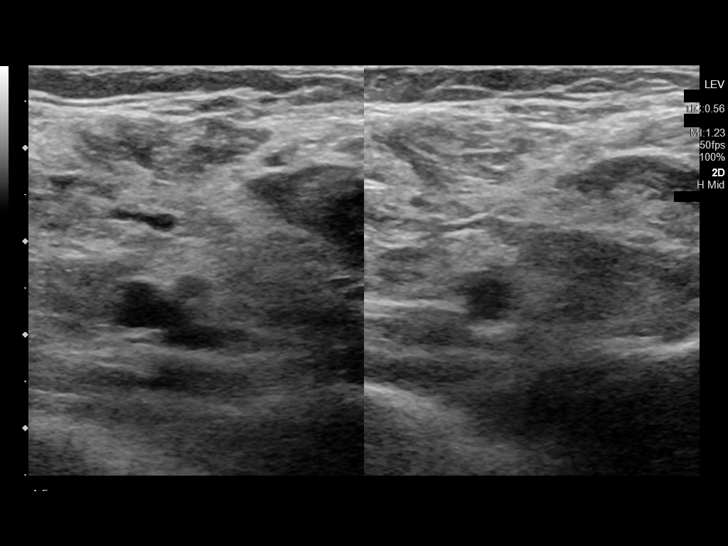
[im 30/33]
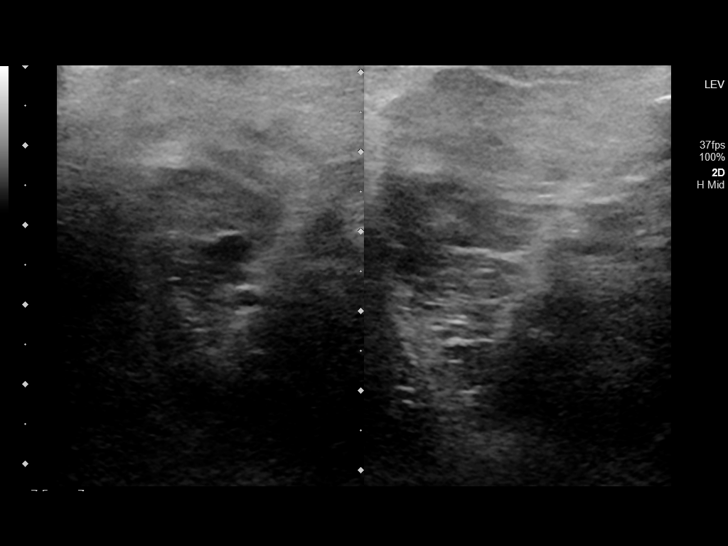
[im 33/33]
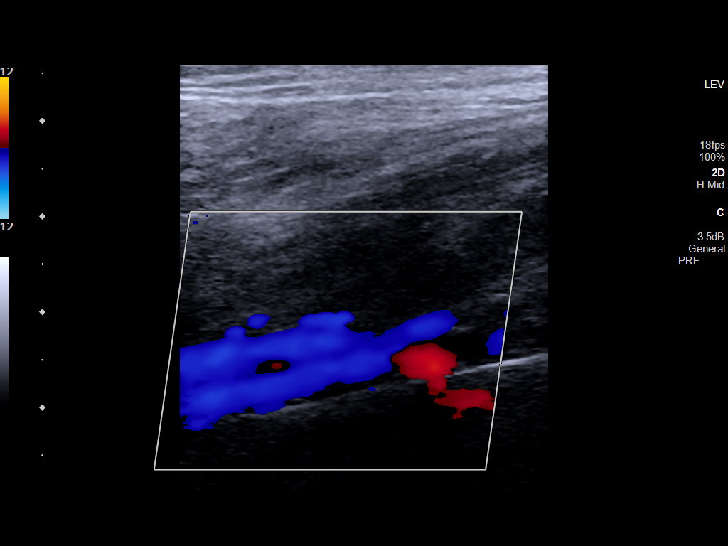

[14 of 24 positions shown; findings below may reference images not displayed]

FINDINGS: VENOUS

Normal compressibility of the common femoral, superficial femoral,
and popliteal veins, as well as the visualized calf veins.
Visualized portions of profunda femoral vein and great saphenous
vein unremarkable. No filling defects to suggest DVT on grayscale or
color Doppler imaging. Doppler waveforms show normal direction of
venous flow, normal respiratory plasticity and response to
augmentation.

Limited views of the contralateral common femoral vein are
unremarkable.

OTHER

None.

Limitations: none
IMPRESSION: There is no evidence of deep venous thrombosis in the left lower
extremity.

## 2022-12-19 IMAGING — DX DG TIBIA/FIBULA 2V*L*
4 series · 4 of 4 positions shown · non-contrast
Comparison: None.

CLINICAL DATA: Left lower leg pain.

EXAM:
LEFT TIBIA AND FIBULA - 2 VIEW

[tibia ap (1 of 2)]
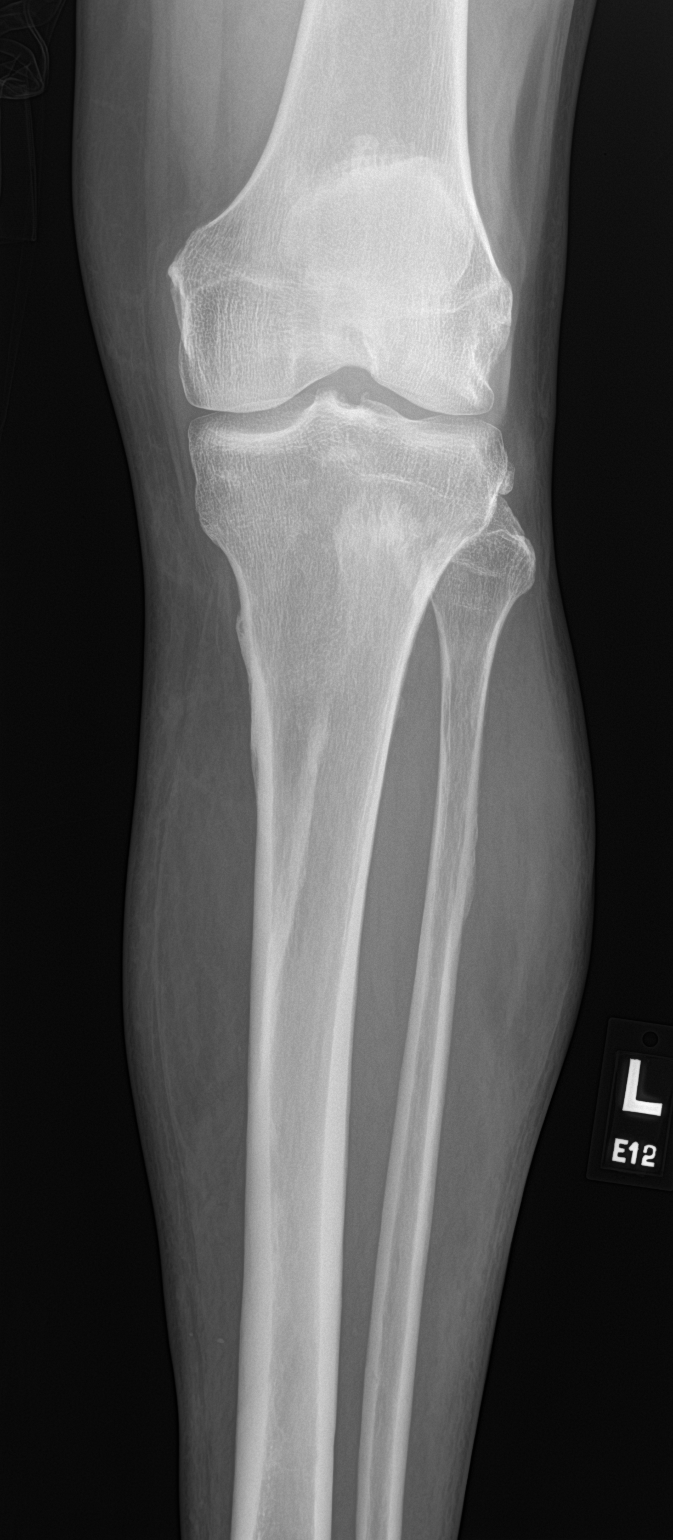

[tibia ap (2 of 2)]
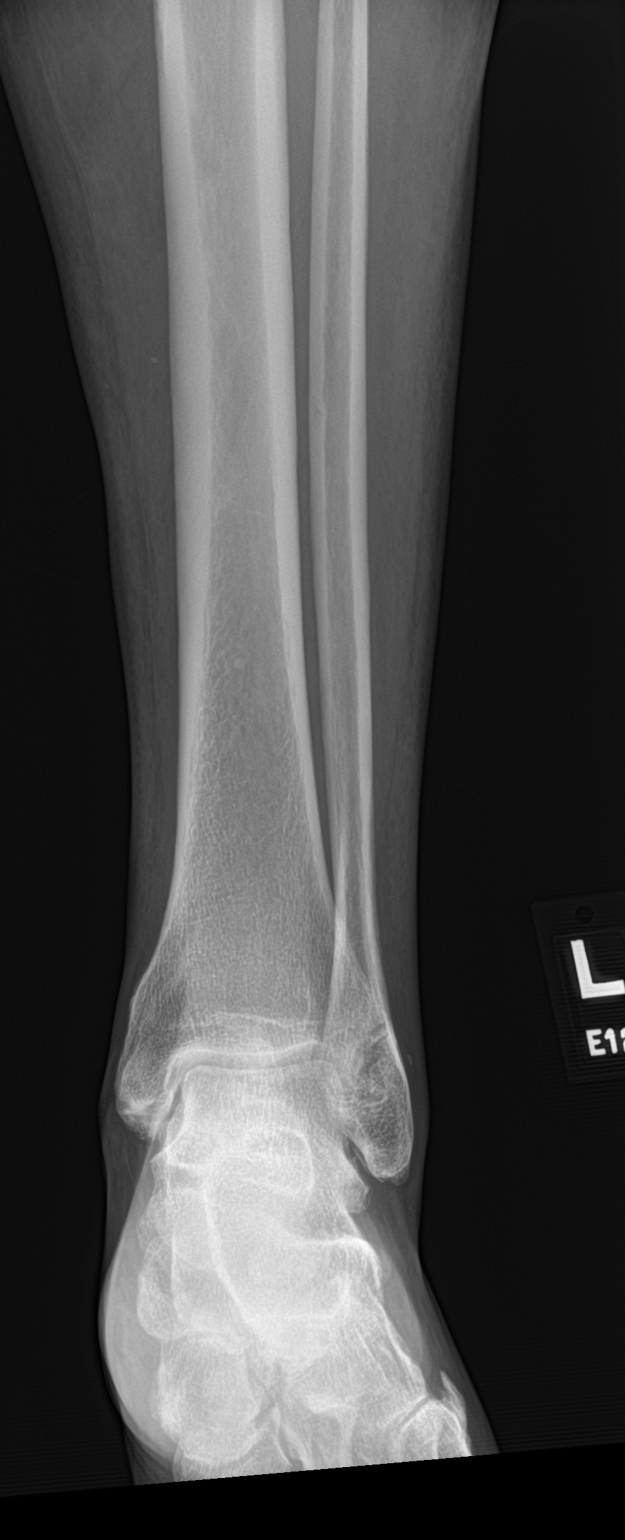

[tibia lat (1 of 2)]
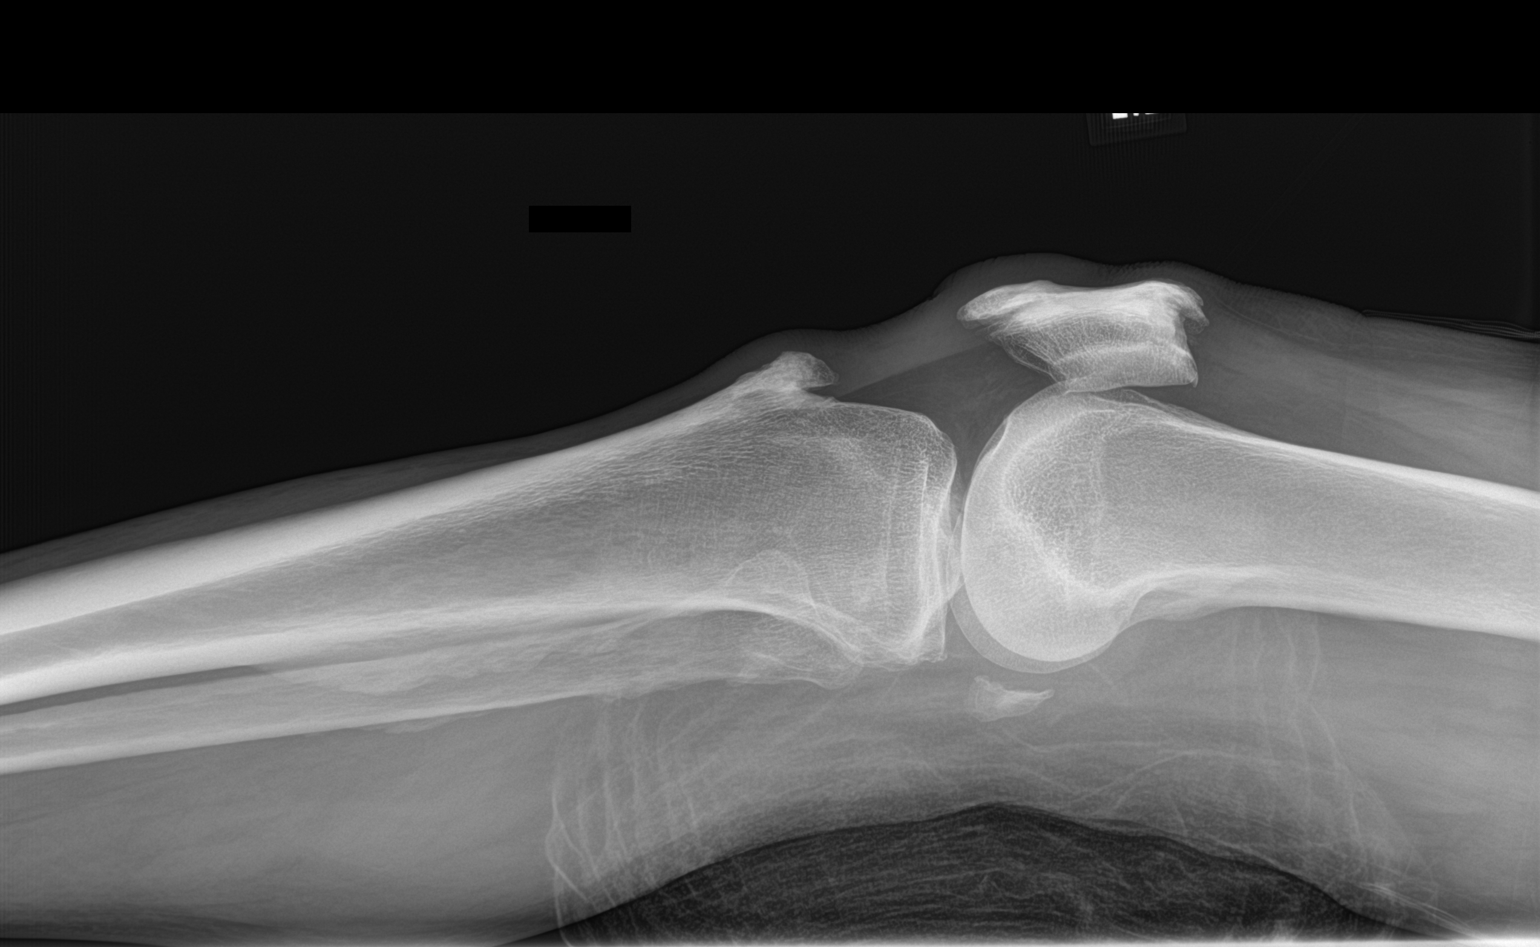

[tibia lat (2 of 2)]
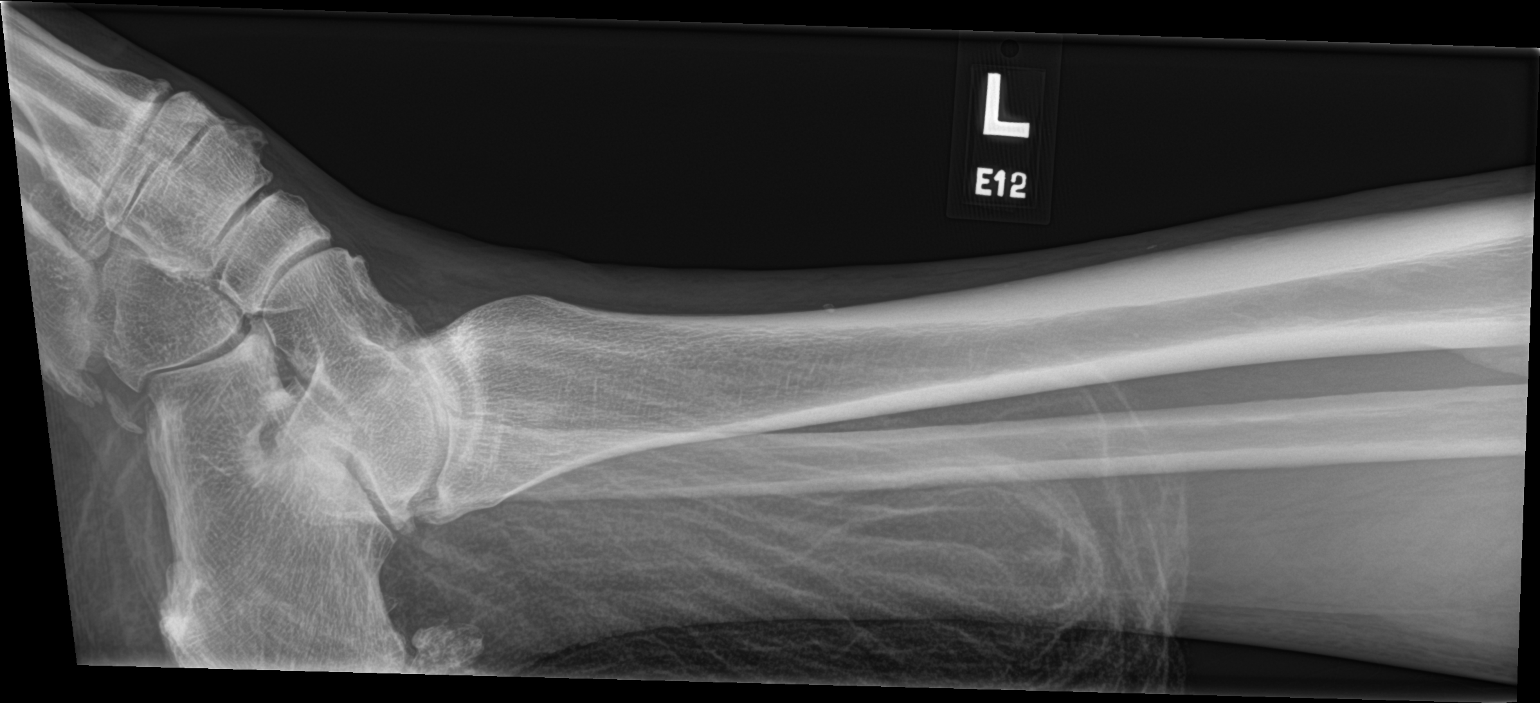

[4 of 4 positions shown; findings below may reference images not displayed]

FINDINGS: There is no evidence of fracture or other focal bone lesions. Soft
tissues are unremarkable.
IMPRESSION: Negative.

## 2023-01-20 ENCOUNTER — Ambulatory Visit: Payer: Medicare Other | Admitting: Family Medicine

## 2023-02-02 ENCOUNTER — Encounter: Payer: Self-pay | Admitting: Family Medicine

## 2023-02-02 ENCOUNTER — Ambulatory Visit: Payer: Medicare Other | Admitting: Family Medicine

## 2023-02-02 VITALS — BP 128/70 | HR 58 | Temp 97.9°F | Resp 14 | Wt 150.2 lb

## 2023-02-02 DIAGNOSIS — I4891 Unspecified atrial fibrillation: Secondary | ICD-10-CM

## 2023-02-02 DIAGNOSIS — N1831 Chronic kidney disease, stage 3a: Secondary | ICD-10-CM | POA: Diagnosis not present

## 2023-02-02 DIAGNOSIS — I129 Hypertensive chronic kidney disease with stage 1 through stage 4 chronic kidney disease, or unspecified chronic kidney disease: Secondary | ICD-10-CM

## 2023-02-02 DIAGNOSIS — E1169 Type 2 diabetes mellitus with other specified complication: Secondary | ICD-10-CM | POA: Diagnosis not present

## 2023-02-02 DIAGNOSIS — I700 Atherosclerosis of aorta: Secondary | ICD-10-CM

## 2023-02-02 DIAGNOSIS — E1122 Type 2 diabetes mellitus with diabetic chronic kidney disease: Secondary | ICD-10-CM | POA: Diagnosis not present

## 2023-02-02 DIAGNOSIS — D692 Other nonthrombocytopenic purpura: Secondary | ICD-10-CM

## 2023-02-02 DIAGNOSIS — D638 Anemia in other chronic diseases classified elsewhere: Secondary | ICD-10-CM

## 2023-02-02 DIAGNOSIS — E039 Hypothyroidism, unspecified: Secondary | ICD-10-CM

## 2023-02-02 DIAGNOSIS — E785 Hyperlipidemia, unspecified: Secondary | ICD-10-CM

## 2023-02-02 DIAGNOSIS — I5022 Chronic systolic (congestive) heart failure: Secondary | ICD-10-CM

## 2023-02-02 DIAGNOSIS — G2581 Restless legs syndrome: Secondary | ICD-10-CM

## 2023-02-02 LAB — POCT GLYCOSYLATED HEMOGLOBIN (HGB A1C): Hemoglobin A1C: 7.8 % — AB (ref 4.0–5.6)

## 2023-02-02 MED ORDER — GLIPIZIDE 2.5 MG PO TABS: 1.0000 | ORAL_TABLET | Freq: Every day | ORAL | 1 refills | Status: DC

## 2023-02-02 MED ORDER — METFORMIN HCL ER 750 MG PO TB24: 1500.0000 mg | ORAL_TABLET | Freq: Every day | ORAL | 1 refills | Status: DC

## 2023-02-02 MED ORDER — ROPINIROLE HCL 1 MG PO TABS: 1.0000 mg | ORAL_TABLET | Freq: Two times a day (BID) | ORAL | 1 refills | Status: DC

## 2023-02-02 MED ORDER — LEVOTHYROXINE SODIUM 25 MCG PO TABS: 25.0000 ug | ORAL_TABLET | Freq: Every day | ORAL | 1 refills | Status: DC

## 2023-02-02 MED ORDER — ATORVASTATIN CALCIUM 10 MG PO TABS: 10.0000 mg | ORAL_TABLET | Freq: Every day | ORAL | 1 refills | Status: DC
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
## 2023-02-16 ENCOUNTER — Ambulatory Visit: Payer: Medicare Other | Admitting: Family Medicine

## 2023-02-27 ENCOUNTER — Other Ambulatory Visit: Payer: Self-pay | Admitting: Family Medicine

## 2023-02-27 DIAGNOSIS — E1122 Type 2 diabetes mellitus with diabetic chronic kidney disease: Secondary | ICD-10-CM

## 2023-02-27 DIAGNOSIS — E1169 Type 2 diabetes mellitus with other specified complication: Secondary | ICD-10-CM

## 2023-03-02 LAB — HM DIABETES EYE EXAM

## 2023-03-02 NOTE — Telephone Encounter (Signed)
 Requested Prescriptions  Pending Prescriptions Disp Refills   Lancets (ONETOUCH DELICA PLUS LANCET33G) MISC [Pharmacy Med Name: ONE TOUCH DELICA PLUS 33G LANC] 100 each 1    Sig: CHECK BLOOD SUGAR AT NOON     Endocrinology: Diabetes - Testing Supplies Passed - 03/02/2023 10:04 AM      Passed - Valid encounter within last 12 months    Recent Outpatient Visits           4 weeks ago Dyslipidemia associated with type 2 diabetes mellitus [facility])   [facility] [place], Arvis Laura, MD   5 months ago Dyslipidemia associated with type 2 diabetes mellitus [facility])   [facility] Arleen Lacer, MD   9 months ago Dyslipidemia associated with type 2 diabetes mellitus [facility])   [facility] Sow[place], Krichna, MD   1 year ago Dyslipidemia associated with type 2 diabetes mellitus [facility])   [facility] Sowles, Krichna, MD   1 year ago Dyslipidemia associated with type 2 diabetes mellitus [facility])   [facility]       Future Appointments             In 3 months Ava Lei, Krichna, MD [facility]   In 5 months Stoioff, Kizzie Perks, MD [facility] 

## 2023-04-25 ENCOUNTER — Other Ambulatory Visit: Payer: Self-pay | Admitting: Family Medicine

## 2023-04-25 DIAGNOSIS — E039 Hypothyroidism, unspecified: Secondary | ICD-10-CM

## 2023-05-03 ENCOUNTER — Other Ambulatory Visit: Payer: Self-pay | Admitting: Family Medicine

## 2023-05-03 DIAGNOSIS — K219 Gastro-esophageal reflux disease without esophagitis: Secondary | ICD-10-CM

## 2023-05-04 NOTE — Progress Notes (Signed)
 [facility] Quality Team Note  Name: Billy Franco Date of Birth: [birth year] MRN: [id number] Date: 05/04/2023  [facility] Quality Team has reviewed this patient's chart, please see recommendations below:  [facility] Quality Other; (Patient due for KED labs. Patient needs eGFR and URINE Microalbumin/Creatinine Ratio completed for gap closure. Please address at upcoming appt with PCP on 06/08/2023)

## 2023-05-10 ENCOUNTER — Emergency Department

## 2023-05-10 ENCOUNTER — Inpatient Hospital Stay
Admission: EM | Admit: 2023-05-10 | Discharge: 2023-05-22 | DRG: 028 | Disposition: A | Discharge status: Skilled Nursing Facility | Attending: Internal Medicine | Admitting: Internal Medicine

## 2023-05-10 ENCOUNTER — Other Ambulatory Visit: Payer: Self-pay

## 2023-05-10 DIAGNOSIS — G9341 Metabolic encephalopathy: Secondary | ICD-10-CM | POA: Diagnosis not present

## 2023-05-10 DIAGNOSIS — G9589 Other specified diseases of spinal cord: Secondary | ICD-10-CM | POA: Diagnosis present

## 2023-05-10 DIAGNOSIS — Z751 Person awaiting admission to adequate facility elsewhere: Secondary | ICD-10-CM

## 2023-05-10 DIAGNOSIS — E785 Hyperlipidemia, unspecified: Secondary | ICD-10-CM | POA: Diagnosis present

## 2023-05-10 DIAGNOSIS — R6510 Systemic inflammatory response syndrome (SIRS) of non-infectious origin without acute organ dysfunction: Secondary | ICD-10-CM | POA: Diagnosis not present

## 2023-05-10 DIAGNOSIS — Z91048 Other nonmedicinal substance allergy status: Secondary | ICD-10-CM

## 2023-05-10 DIAGNOSIS — Z7984 Long term (current) use of oral hypoglycemic drugs: Secondary | ICD-10-CM

## 2023-05-10 DIAGNOSIS — Z961 Presence of intraocular lens: Secondary | ICD-10-CM | POA: Diagnosis present

## 2023-05-10 DIAGNOSIS — S14109A Unspecified injury at unspecified level of cervical spinal cord, initial encounter: Secondary | ICD-10-CM | POA: Diagnosis not present

## 2023-05-10 DIAGNOSIS — N179 Acute kidney failure, unspecified: Secondary | ICD-10-CM | POA: Diagnosis present

## 2023-05-10 DIAGNOSIS — E039 Hypothyroidism, unspecified: Secondary | ICD-10-CM | POA: Diagnosis present

## 2023-05-10 DIAGNOSIS — S14129A Central cord syndrome at unspecified level of cervical spinal cord, initial encounter: Secondary | ICD-10-CM | POA: Diagnosis present

## 2023-05-10 DIAGNOSIS — M4802 Spinal stenosis, cervical region: Secondary | ICD-10-CM | POA: Diagnosis not present

## 2023-05-10 DIAGNOSIS — Z79890 Hormone replacement therapy (postmenopausal): Secondary | ICD-10-CM

## 2023-05-10 DIAGNOSIS — J9601 Acute respiratory failure with hypoxia: Secondary | ICD-10-CM | POA: Diagnosis not present

## 2023-05-10 DIAGNOSIS — S14151A Other incomplete lesion at C1 level of cervical spinal cord, initial encounter: Secondary | ICD-10-CM | POA: Diagnosis present

## 2023-05-10 DIAGNOSIS — R338 Other retention of urine: Secondary | ICD-10-CM

## 2023-05-10 DIAGNOSIS — N183 Chronic kidney disease, stage 3 unspecified: Secondary | ICD-10-CM | POA: Diagnosis present

## 2023-05-10 DIAGNOSIS — W07XXXA Fall from chair, initial encounter: Secondary | ICD-10-CM | POA: Diagnosis present

## 2023-05-10 DIAGNOSIS — E872 Acidosis, unspecified: Secondary | ICD-10-CM | POA: Diagnosis present

## 2023-05-10 DIAGNOSIS — Z8249 Family history of ischemic heart disease and other diseases of the circulatory system: Secondary | ICD-10-CM

## 2023-05-10 DIAGNOSIS — I5023 Acute on chronic systolic (congestive) heart failure: Secondary | ICD-10-CM | POA: Diagnosis present

## 2023-05-10 DIAGNOSIS — I480 Paroxysmal atrial fibrillation: Secondary | ICD-10-CM | POA: Diagnosis present

## 2023-05-10 DIAGNOSIS — G9520 Unspecified cord compression: Secondary | ICD-10-CM

## 2023-05-10 DIAGNOSIS — J690 Pneumonitis due to inhalation of food and vomit: Secondary | ICD-10-CM | POA: Diagnosis not present

## 2023-05-10 DIAGNOSIS — E871 Hypo-osmolality and hyponatremia: Secondary | ICD-10-CM | POA: Diagnosis present

## 2023-05-10 DIAGNOSIS — W19XXXA Unspecified fall, initial encounter: Secondary | ICD-10-CM | POA: Diagnosis not present

## 2023-05-10 DIAGNOSIS — E1122 Type 2 diabetes mellitus with diabetic chronic kidney disease: Secondary | ICD-10-CM | POA: Diagnosis present

## 2023-05-10 DIAGNOSIS — R310 Gross hematuria: Secondary | ICD-10-CM | POA: Diagnosis present

## 2023-05-10 DIAGNOSIS — Z955 Presence of coronary angioplasty implant and graft: Secondary | ICD-10-CM

## 2023-05-10 DIAGNOSIS — G2581 Restless legs syndrome: Secondary | ICD-10-CM | POA: Diagnosis present

## 2023-05-10 DIAGNOSIS — M50221 Other cervical disc displacement at C4-C5 level: Secondary | ICD-10-CM | POA: Diagnosis present

## 2023-05-10 DIAGNOSIS — G959 Disease of spinal cord, unspecified: Secondary | ICD-10-CM | POA: Diagnosis not present

## 2023-05-10 DIAGNOSIS — Z7902 Long term (current) use of antithrombotics/antiplatelets: Secondary | ICD-10-CM

## 2023-05-10 DIAGNOSIS — S0990XA Unspecified injury of head, initial encounter: Secondary | ICD-10-CM | POA: Diagnosis present

## 2023-05-10 DIAGNOSIS — Z885 Allergy status to narcotic agent status: Secondary | ICD-10-CM

## 2023-05-10 DIAGNOSIS — I255 Ischemic cardiomyopathy: Secondary | ICD-10-CM | POA: Diagnosis present

## 2023-05-10 DIAGNOSIS — M50222 Other cervical disc displacement at C5-C6 level: Secondary | ICD-10-CM | POA: Diagnosis present

## 2023-05-10 DIAGNOSIS — I482 Chronic atrial fibrillation, unspecified: Secondary | ICD-10-CM | POA: Diagnosis present

## 2023-05-10 DIAGNOSIS — Z9842 Cataract extraction status, left eye: Secondary | ICD-10-CM

## 2023-05-10 DIAGNOSIS — I130 Hypertensive heart and chronic kidney disease with heart failure and stage 1 through stage 4 chronic kidney disease, or unspecified chronic kidney disease: Secondary | ICD-10-CM | POA: Diagnosis present

## 2023-05-10 DIAGNOSIS — Z888 Allergy status to other drugs, medicaments and biological substances status: Secondary | ICD-10-CM

## 2023-05-10 DIAGNOSIS — R296 Repeated falls: Secondary | ICD-10-CM | POA: Diagnosis present

## 2023-05-10 DIAGNOSIS — M5126 Other intervertebral disc displacement, lumbar region: Secondary | ICD-10-CM | POA: Diagnosis present

## 2023-05-10 DIAGNOSIS — Z87442 Personal history of urinary calculi: Secondary | ICD-10-CM

## 2023-05-10 DIAGNOSIS — R339 Retention of urine, unspecified: Secondary | ICD-10-CM | POA: Diagnosis not present

## 2023-05-10 DIAGNOSIS — E1165 Type 2 diabetes mellitus with hyperglycemia: Secondary | ICD-10-CM | POA: Diagnosis present

## 2023-05-10 DIAGNOSIS — S12300A Unspecified displaced fracture of fourth cervical vertebra, initial encounter for closed fracture: Secondary | ICD-10-CM | POA: Diagnosis present

## 2023-05-10 DIAGNOSIS — I2510 Atherosclerotic heart disease of native coronary artery without angina pectoris: Secondary | ICD-10-CM | POA: Diagnosis present

## 2023-05-10 DIAGNOSIS — I5032 Chronic diastolic (congestive) heart failure: Secondary | ICD-10-CM | POA: Diagnosis not present

## 2023-05-10 DIAGNOSIS — Z9841 Cataract extraction status, right eye: Secondary | ICD-10-CM

## 2023-05-10 DIAGNOSIS — Z79899 Other long term (current) drug therapy: Secondary | ICD-10-CM

## 2023-05-10 DIAGNOSIS — R221 Localized swelling, mass and lump, neck: Secondary | ICD-10-CM | POA: Diagnosis not present

## 2023-05-10 DIAGNOSIS — S134XXA Sprain of ligaments of cervical spine, initial encounter: Principal | ICD-10-CM

## 2023-05-10 DIAGNOSIS — Z79891 Long term (current) use of opiate analgesic: Secondary | ICD-10-CM

## 2023-05-10 LAB — COMPREHENSIVE METABOLIC PANEL WITH GFR
ALT: 22 U/L (ref 0–44)
AST: 30 U/L (ref 15–41)
Albumin: 4 g/dL (ref 3.5–5.0)
Alkaline Phosphatase: 57 U/L (ref 38–126)
Anion gap: 10 (ref 5–15)
BUN: 37 mg/dL — ABNORMAL HIGH (ref 8–23)
CO2: 25 mmol/L (ref 22–32)
Calcium: 9.8 mg/dL (ref 8.9–10.3)
Chloride: 102 mmol/L (ref 98–111)
Creatinine, Ser: 1.41 mg/dL — ABNORMAL HIGH (ref 0.61–1.24)
GFR, Estimated: 50 mL/min — ABNORMAL LOW
Glucose, Bld: 103 mg/dL — ABNORMAL HIGH (ref 70–99)
Potassium: 4 mmol/L (ref 3.5–5.1)
Sodium: 137 mmol/L (ref 135–145)
Total Bilirubin: 1 mg/dL (ref 0.0–1.2)
Total Protein: 7.1 g/dL (ref 6.5–8.1)

## 2023-05-10 LAB — APTT: aPTT: 29 s (ref 24–36)

## 2023-05-10 LAB — URINALYSIS, ROUTINE W REFLEX MICROSCOPIC
Bacteria, UA: NONE SEEN
Bilirubin Urine: NEGATIVE
Glucose, UA: NEGATIVE mg/dL
Ketones, ur: NEGATIVE mg/dL
Leukocytes,Ua: NEGATIVE
Nitrite: NEGATIVE
Protein, ur: NEGATIVE mg/dL
Specific Gravity, Urine: 1.012 (ref 1.005–1.030)
pH: 6 (ref 5.0–8.0)

## 2023-05-10 LAB — CBC WITH DIFFERENTIAL/PLATELET
Abs Immature Granulocytes: 0.02 10*3/uL (ref 0.00–0.07)
Basophils Absolute: 0 10*3/uL (ref 0.0–0.1)
Basophils Relative: 1 %
Eosinophils Absolute: 0.3 10*3/uL (ref 0.0–0.5)
Eosinophils Relative: 4 %
HCT: 32.5 % — ABNORMAL LOW (ref 39.0–52.0)
Hemoglobin: 10.6 g/dL — ABNORMAL LOW (ref 13.0–17.0)
Immature Granulocytes: 0 %
Lymphocytes Relative: 14 %
Lymphs Abs: 0.9 10*3/uL (ref 0.7–4.0)
MCH: 32.1 pg (ref 26.0–34.0)
MCHC: 32.6 g/dL (ref 30.0–36.0)
MCV: 98.5 fL (ref 80.0–100.0)
Monocytes Absolute: 0.7 10*3/uL (ref 0.1–1.0)
Monocytes Relative: 11 %
Neutro Abs: 4.5 10*3/uL (ref 1.7–7.7)
Neutrophils Relative %: 70 %
Platelets: 210 10*3/uL (ref 150–400)
RBC: 3.3 MIL/uL — ABNORMAL LOW (ref 4.22–5.81)
RDW: 12.8 % (ref 11.5–15.5)
WBC: 6.4 10*3/uL (ref 4.0–10.5)
nRBC: 0 % (ref 0.0–0.2)

## 2023-05-10 LAB — GLUCOSE, CAPILLARY: Glucose-Capillary: 77 mg/dL (ref 70–99)

## 2023-05-10 LAB — PROTIME-INR
INR: 1 (ref 0.8–1.2)
Prothrombin Time: 13.8 s (ref 11.4–15.2)

## 2023-05-10 LAB — CK: Total CK: 292 U/L (ref 49–397)

## 2023-05-10 LAB — SALICYLATE LEVEL: Salicylate Lvl: <7 mg/dL — ABNORMAL LOW (ref 7.0–30.0)

## 2023-05-10 MED ORDER — ATORVASTATIN CALCIUM 20 MG PO TABS
10.0000 mg | ORAL_TABLET | Freq: Every day | ORAL | Status: DC
  Administered 2023-05-11 – 2023-05-12 (×2): 10 mg via ORAL
  Filled 2023-05-10 (×2): qty 1

## 2023-05-10 MED ORDER — PHENYLEPHRINE HCL-NACL 20-0.9 MG/250ML-% IV SOLN
0.0000 ug/min | INTRAVENOUS | Status: DC
  Administered 2023-05-10: 20 ug/min via INTRAVENOUS
  Administered 2023-05-11: 140 ug/min via INTRAVENOUS
  Administered 2023-05-11: 90 ug/min via INTRAVENOUS
  Administered 2023-05-11: 130 ug/min via INTRAVENOUS
  Administered 2023-05-11: 120 ug/min via INTRAVENOUS
  Administered 2023-05-11: 130 ug/min via INTRAVENOUS
  Administered 2023-05-12: 30 ug/min via INTRAVENOUS
  Administered 2023-05-12: 50 ug/min via INTRAVENOUS
  Administered 2023-05-12: 80 ug/min via INTRAVENOUS
  Administered 2023-05-13: 100 ug/min via INTRAVENOUS
  Administered 2023-05-13 (×2): 90 ug/min via INTRAVENOUS
  Administered 2023-05-13: 100 ug/min via INTRAVENOUS
  Filled 2023-05-10 (×13): qty 250

## 2023-05-10 MED ORDER — INSULIN ASPART 100 UNIT/ML IJ SOLN
0.0000 [IU] | INTRAMUSCULAR | Status: DC
  Administered 2023-05-11: 2 [IU] via SUBCUTANEOUS
  Administered 2023-05-12: 1 [IU] via SUBCUTANEOUS
  Administered 2023-05-12 – 2023-05-13 (×6): 2 [IU] via SUBCUTANEOUS
  Administered 2023-05-13: 3 [IU] via SUBCUTANEOUS
  Filled 2023-05-10 (×9): qty 1

## 2023-05-10 MED ORDER — PANTOPRAZOLE SODIUM 40 MG IV SOLR
40.0000 mg | Freq: Every day | INTRAVENOUS | Status: DC
  Administered 2023-05-10 – 2023-05-16 (×7): 40 mg via INTRAVENOUS
  Filled 2023-05-10 (×7): qty 10

## 2023-05-10 MED ORDER — FENTANYL CITRATE PF 50 MCG/ML IJ SOSY
25.0000 ug | PREFILLED_SYRINGE | INTRAMUSCULAR | Status: DC | PRN
  Administered 2023-05-10 – 2023-05-12 (×5): 25 ug via INTRAVENOUS
  Filled 2023-05-10 (×6): qty 1

## 2023-05-10 MED ORDER — ACETAMINOPHEN 10 MG/ML IV SOLN
1000.0000 mg | Freq: Four times a day (QID) | INTRAVENOUS | Status: AC
  Administered 2023-05-10 – 2023-05-11 (×3): 1000 mg via INTRAVENOUS
  Filled 2023-05-10 (×4): qty 100

## 2023-05-10 MED ORDER — MORPHINE SULFATE (PF) 4 MG/ML IV SOLN
4.0000 mg | Freq: Once | INTRAVENOUS | Status: AC
  Administered 2023-05-10: 4 mg via INTRAVENOUS
  Filled 2023-05-10: qty 1

## 2023-05-10 MED ORDER — BUTALBITAL-APAP-CAFFEINE 50-325-40 MG PO TABS
1.0000 | ORAL_TABLET | Freq: Once | ORAL | Status: DC
Start: 2023-05-10 — End: 2023-05-10

## 2023-05-10 MED ORDER — LEVOTHYROXINE SODIUM 50 MCG PO TABS: 25.0000 ug | ORAL_TABLET | Freq: Every day | ORAL | Status: DC

## 2023-05-10 MED ORDER — ROPINIROLE HCL 1 MG PO TABS
1.0000 mg | ORAL_TABLET | Freq: Two times a day (BID) | ORAL | Status: DC
  Administered 2023-05-10 – 2023-05-13 (×6): 1 mg via ORAL
  Filled 2023-05-10 (×7): qty 1

## 2023-05-10 MED ORDER — CHLORHEXIDINE GLUCONATE CLOTH 2 % EX PADS
6.0000 | MEDICATED_PAD | Freq: Every day | CUTANEOUS | Status: DC
  Administered 2023-05-10 – 2023-05-22 (×13): 6 via TOPICAL

## 2023-05-10 MED ORDER — DOCUSATE SODIUM 100 MG PO CAPS
100.0000 mg | ORAL_CAPSULE | Freq: Two times a day (BID) | ORAL | Status: DC | PRN
  Administered 2023-05-11: 100 mg via ORAL
  Filled 2023-05-10: qty 1

## 2023-05-10 MED ORDER — POLYETHYLENE GLYCOL 3350 17 G PO PACK: 17.0000 g | PACK | Freq: Every day | ORAL | Status: DC | PRN

## 2023-05-10 NOTE — Progress Notes (Signed)
 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  Recommended ICU admission per neurosurgery for frequent neurological checks and to elevate BP for perfusion.  Vital signs are within normal limits.  Started on low-dose phenylephrine .  Results consistent with mildly elevated creatinine, normocytic anemia, and otherwise imaging as discussed above  eICU Interventions  Neurosurgical evaluation-maintain MAP greater than 85 with phenylephrine  as needed  Frequent neurological checks  Holding home Plavix   DVT prophylaxis with SCDs GI prophylaxis with pantoprazole      Intervention Category Evaluation Type: New Patient Evaluation  Sahil Milner 05/10/2023, 11:18 PM

## 2023-05-10 NOTE — ED Notes (Signed)
Pt taken to CT then MRI

## 2023-05-10 NOTE — ED Triage Notes (Signed)
 Pt tipped over in a folding chair and landed on grass. Pt says he couldn't move his arms or legs. Pt says he still can't, but he is actively moving his arms as he talks. Pt c/o pain all over. Pt has small abrasion to scalp. Pt takes plavix . Pt denies LOC. Pt says his neck hurts and he feels numb all over.

## 2023-05-10 NOTE — ED Provider Notes (Signed)
 [facility] Provider Note    Event Date/Time   First MD Initiated Contact with Patient 05/10/23 1553     (approximate)   History   Fall   HPI  Billy Franco is a 83 year old male with history of T2DM, HTN, Afib, CHF presenting to the ER for evaluation after a fall. Patient went to sit in a lawn chair when he fell out of the chair and landed on his left side. He did hit his head. No LOC.  Reports that he feels weak over his entire body and initially felt like he could not move his arms or legs.  Family was inside and reports that he likely was on the ground for at most 5 to 10 minutes.  Family reports that patient initially was having difficulty grasping their hand bilaterally.  Currently reports "pain all over".  Family reports the patient also had an unwitnessed fall on Friday, struck his head, but otherwise no noted injuries.     Physical Exam   Triage Vital Signs: ED Triage Vitals  Encounter Vitals Group     BP 05/10/23 1537 103/88     Systolic BP Percentile --      Diastolic BP Percentile --      Pulse Rate 05/10/23 1537 86     Resp 05/10/23 1537 16     Temp 05/10/23 1537 (!) 97.5 F (36.4 C)     Temp Source 05/10/23 1537 Oral     SpO2 05/10/23 1537 100 %     Weight 05/10/23 1542 147 lb (66.7 kg)     Height 05/10/23 1542 5\' 7"  (1.702 m)     Head Circumference --      Peak Flow --      Pain Score 05/10/23 1542 10     Pain Loc --      Pain Education --      Exclude from Growth Chart --     Most recent vital signs: Vitals:   05/10/23 2251 05/10/23 2255  BP: 118/70 107/64  Pulse: 80 75  Resp: (!) 24 18  Temp:    SpO2: 100% 100%   Nursing notes and vital signs reviewed.  General: Adult male, laying in bed, awake, interactive Head: Healing abrasion over the front of the head, small hematoma over the posterior scalp, no active bleeding or open skin Back: Tenderness throughout the back, midline cervical spine tenderness, cervical collar  placed, no midline tenderness of the thoracic and lumbar spine Chest: Symmetric chest rise, no focal tenderness to palpation Cardiac: Regular rhythm and rate.  Respiratory: Lungs clear to auscultation Abdomen: Soft, nondistended. No focal tenderness to palpation.  Pelvis: Stable in AP and lateral compression. No focal tenderness to palpation. MSK: No deformity to bilateral upper and lower extremity. Full range of motion to bilateral upper lower extremity. Neuro: Alert, oriented. GCS 15.  Generalized weakness with perhaps slightly increased weakness of the left upper and lower extremity compared to the contralateral side.  Intact sensation to light touch in bilateral upper and lower extremity the patient subjectively reports feeling weak in all extremities. Skin: No evidence of burns or lacerations.   ED Results / Procedures / Treatments   Labs (all labs ordered are listed, but only abnormal results are displayed) Labs Reviewed  CBC WITH DIFFERENTIAL/PLATELET - Abnormal; Notable for the following components:      Result Value   RBC 3.30 (*)    Hemoglobin 10.6 (*)    HCT 32.5 (*)  All other components within normal limits  COMPREHENSIVE METABOLIC PANEL WITH GFR - Abnormal; Notable for the following components:   Glucose, Bld 103 (*)    BUN 37 (*)    Creatinine, Ser 1.41 (*)    GFR, Estimated 50 (*)    All other components within normal limits  URINALYSIS, ROUTINE W REFLEX MICROSCOPIC - Abnormal; Notable for the following components:   Color, Urine STRAW (*)    APPearance CLEAR (*)    Hgb urine dipstick SMALL (*)    All other components within normal limits  SALICYLATE LEVEL - Abnormal; Notable for the following components:   Salicylate Lvl <7.0 (*)    All other components within normal limits  MRSA NEXT GEN BY PCR, NASAL  CK  PROTIME-INR  APTT  GLUCOSE, CAPILLARY  PHOSPHORUS  MAGNESIUM   BASIC METABOLIC PANEL WITH GFR  CBC  TYPE AND SCREEN     EKG EKG independently  reviewed interpreted by myself (ER attending) demonstrates:  EKG demonstrates sinus rhythm rate of 67, PR 168, QRS 111, QTc 411, no acute ST changes  RADIOLOGY Imaging independently reviewed and interpreted by myself demonstrates:  CT head without acute bleed CT C-spine without acute fracture MRI brain without acute infarct MRI C-spine demonstrates traumatic disc injury  Formal Radiology Read:  MR Cervical Spine Wo Contrast Result Date: 05/10/2023 CLINICAL DATA:  Ataxia, cervical trauma arm weakness, eval central cord EXAM: MRI CERVICAL SPINE WITHOUT CONTRAST TECHNIQUE: Multiplanar, multisequence MR imaging of the cervical spine was performed. No intravenous contrast was administered. COMPARISON:  None Available. FINDINGS: Alignment: No substantial sagittal subluxation. Vertebrae: No bone marrow edema to suggest acute fracture. Edema in the anterior C6-C7 disc space with extension anteriorly and destruction of the anterior longitudinal ligament (see series 3, image 8). Adjacent prevertebral edema which extends inferiorly into the upper thoracic spine. Cord: T2 hyperintensity within the cord at C3-C4. Posterior Fossa, vertebral arteries, paraspinal tissues: The visualized 2 artery flow voids are maintained. Small volume of prevertebral edema in the lower cervical spine extending into the upper thoracic spine. Disc levels: C2-C3: Small posterior disc osteophyte complex. Mild canal stenosis. Bilateral facet uncovertebral hypertrophy with mild left foraminal stenosis. C3-C4: Posterior disc osteophyte complex and ligamentum flavum thickening. Resulting severe canal stenosis with cord compression. Bilateral facet uncovertebral hypertrophy. Resulting severe bilateral foraminal stenosis. C4-C5: Posterior disc osteophyte complex and ligamentum flavum thickening. Resulting moderate to severe canal stenosis. Bilateral facet and vertebral hypertrophy with severe bilateral foraminal stenosis. C5-C6: Segmentation  anomaly. Partial fusion across the disc. Bilateral facet uncovertebral hypertrophy. Canal and foramina are patent. C6-C7: Posterior disc osteophyte complex with central disc protrusion. Bilateral facet and uncovertebral hypertrophy. Moderate bilateral foraminal stenosis. Mild to moderate canal stenosis. C7-T1: Uncovertebral hypertrophy without significant canal or foraminal stenosis. IMPRESSION: 1. 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 Edema extends posteriorly to involve the C6-C7 disc, compatible with traumatic disc injury. Small volume of adjacent prevertebral edema. 2. At C3-C4, severe canal stenosis with cord compression. T2 hyperintensity in the cord at this level is compatible with cord edema and/or myelomalacia. Severe bilateral foraminal stenosis at this level. 3. At C4-C5, moderate to severe canal stenosis and severe bilateral foraminal stenosis. 4. At C6-C7, mild to moderate canal stenosis and moderate bilateral foraminal stenosis. Electronically Signed   By: Stevenson Elbe M.D.   On: 05/10/2023 20:41   MR BRAIN WO CONTRAST Result Date: 05/10/2023 CLINICAL DATA:  Neuro deficit, acute, stroke suspected left sided weakness EXAM: MRI HEAD WITHOUT CONTRAST TECHNIQUE:  Multiplanar, multiecho pulse sequences of the brain and surrounding structures were obtained without intravenous contrast. COMPARISON:  CT head and CT cervical spine from earlier today. FINDINGS: Brain: No acute infarction, hemorrhage, hydrocephalus, extra-axial collection or mass lesion. Remote right PCA territory infarct. Vascular: Major arterial flow voids are maintained at the skull base. Skull and upper cervical spine: Normal marrow signal. Sinuses/Orbits: Negative. IMPRESSION: 1. No evidence of acute intracranial abnormality. 2. Remote right PCA territory infarct. Electronically Signed   By: Stevenson Elbe M.D.   On: 05/10/2023 20:22   CT Thoracic Spine Wo Contrast Result Date: 05/10/2023 CLINICAL DATA:   Back trauma, fall from chair. EXAM: CT THORACIC AND LUMBAR SPINE WITHOUT CONTRAST TECHNIQUE: Multidetector CT imaging of the thoracic and lumbar spine was performed without contrast. Multiplanar CT image reconstructions were also generated. RADIATION DOSE REDUCTION: This exam was performed according to the departmental dose-optimization program which includes automated exposure control, adjustment of the mA and/or kV according to patient size and/or use of iterative reconstruction technique. COMPARISON:  10/08/2022, 04/28/2022 FINDINGS: CT THORACIC SPINE FINDINGS Alignment: Normal. Vertebrae: No acute fracture or focal pathologic process. Paraspinal and other soft tissues: No paraspinal fat stranding or epidural hematoma. Aortic atherosclerosis is noted. There are multi-vessel coronary artery calcifications. Pleural and parenchymal scarring is noted at the lung apices. There are trace bilateral pleural effusions with atelectasis. Disc levels: Multilevel degenerative endplate changes. There is calcification of the supraspinous ligament. CT LUMBAR SPINE FINDINGS Segmentation: 5 lumbar type vertebrae. Alignment: Normal. Vertebrae: There is a old fracture of the L1, L2, and L3 transverse processes on the left with bone callus formation. No acute fracture is seen. Paraspinal and other soft tissues: No acute abnormality. Aortic atherosclerosis is seen. Left renal calculi are noted. Disc levels: Multi level degenerative endplate changes and facet arthropathy. There is a disc herniation at L4-L5 resulting in severe spinal canal stenosis and moderate to severe neural foraminal stenosis bilaterally, greater on the left than on the right. The spinal canal and neural foramina are otherwise patent. IMPRESSION: CT THORACIC SPINE IMPRESSION 1. Multilevel degenerative changes without evidence of acute fracture. 2. Trace bilateral pleural effusions with atelectasis. 3. Aortic atherosclerosis with coronary artery calcifications. CT  LUMBAR SPINE IMPRESSION 1. 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 2. Multilevel degenerative changes in the lumbar spine as described above. 3. Left renal calculi. Electronically Signed   By: Wyvonnia Heimlich M.D.   On: 05/10/2023 20:01   CT Lumbar Spine Wo Contrast Result Date: 05/10/2023 CLINICAL DATA:  Back trauma, fall from chair. EXAM: CT THORACIC AND LUMBAR SPINE WITHOUT CONTRAST TECHNIQUE: Multidetector CT imaging of the thoracic and lumbar spine was performed without contrast. Multiplanar CT image reconstructions were also generated. RADIATION DOSE REDUCTION: This exam was performed according to the departmental dose-optimization program which includes automated exposure control, adjustment of the mA and/or kV according to patient size and/or use of iterative reconstruction technique. COMPARISON:  10/08/2022, 04/28/2022 FINDINGS: CT THORACIC SPINE FINDINGS Alignment: Normal. Vertebrae: No acute fracture or focal pathologic process. Paraspinal and other soft tissues: No paraspinal fat stranding or epidural hematoma. Aortic atherosclerosis is noted. There are multi-vessel coronary artery calcifications. Pleural and parenchymal scarring is noted at the lung apices. There are trace bilateral pleural effusions with atelectasis. Disc levels: Multilevel degenerative endplate changes. There is calcification of the supraspinous ligament. CT LUMBAR SPINE FINDINGS Segmentation: 5 lumbar type vertebrae. Alignment: Normal.  Vertebrae: There is a old fracture of the L1, L2, and L3 transverse processes on the left with bone callus formation. No acute fracture is seen. Paraspinal and other soft tissues: No acute abnormality. Aortic atherosclerosis is seen. Left renal calculi are noted. Disc levels: Multi level degenerative endplate changes and facet arthropathy. There is a disc  herniation at L4-L5 resulting in severe spinal canal stenosis and moderate to severe neural foraminal stenosis bilaterally, greater on the left than on the right. The spinal canal and neural foramina are otherwise patent. IMPRESSION: CT THORACIC SPINE IMPRESSION 1. Multilevel degenerative changes without evidence of acute fracture. 2. Trace bilateral pleural effusions with atelectasis. 3. Aortic atherosclerosis with coronary artery calcifications. CT LUMBAR SPINE IMPRESSION 1. 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 2. Multilevel degenerative changes in the lumbar spine as described above. 3. Left renal calculi. Electronically Signed   By: Wyvonnia Heimlich M.D.   On: 05/10/2023 20:01   CT Cervical Spine Wo Contrast Result Date: 05/10/2023 CLINICAL DATA:  Fall, weakness EXAM: CT CERVICAL SPINE WITHOUT CONTRAST TECHNIQUE: Multidetector CT imaging of the cervical spine was performed without intravenous contrast. Multiplanar CT image reconstructions were also generated. RADIATION DOSE REDUCTION: This exam was performed according to the departmental dose-optimization program which includes automated exposure control, adjustment of the mA and/or kV according to patient size and/or use of iterative reconstruction technique. COMPARISON:  None Available. FINDINGS: Alignment: No subluxation.  Facet alignment within normal limits. Skull base and vertebrae: No fracture Soft tissues and spinal canal: No prevertebral fluid or swelling. No visible canal hematoma. Disc levels: Prominent multilevel anterior osteophytes. Fusion of the C5-C6 vertebral bodies and posterior elements. Multilevel degenerative changes with osteophytes and mild disc space narrowing. Small central disc protrusion at C2-C3. At least mild canal stenosis. Posterior disc osteophyte complex at C3-C4 with moderate  severe canal stenosis. Posterior disc osteophyte C4-C5 with at least moderate canal stenosis. Posterior disc osteophyte C6-C7 with at least mild canal stenosis and mild mass effect on the anterior thecal sac. Multilevel facet degenerative change. Multilevel foraminal narrowing. Upper chest: Apical scarring Other: None IMPRESSION: 1. Negative for acute fracture or subluxation of the cervical spine. 2. 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 Electronically Signed   By: Esmeralda Hedge M.D.   On: 05/10/2023 17:03   DG Chest Port 1 View Result Date: 05/10/2023 CLINICAL DATA:  Weakness EXAM: PORTABLE CHEST 1 VIEW COMPARISON:  12/21/2019 FINDINGS: Pleural-parenchymal scarring at the apices. No consolidation, pleural effusion or pneumothorax. Borderline to mild cardiomegaly. Aortic atherosclerosis. IMPRESSION: No active disease. Borderline to mild cardiomegaly. Electronically Signed   By: Esmeralda Hedge M.D.   On: 05/10/2023 16:55   CT Head Wo Contrast Result Date: 05/10/2023 CLINICAL DATA:  Head trauma weakness EXAM: CT HEAD WITHOUT CONTRAST TECHNIQUE: Contiguous axial images were obtained from the base of the skull through the vertex without intravenous contrast. RADIATION DOSE REDUCTION: This exam was performed according to the departmental dose-optimization program which includes automated exposure control, adjustment of the mA and/or kV according to patient size and/or use of iterative reconstruction technique. COMPARISON:  CT brain 05/02/2019 FINDINGS: Brain: Mild motion degradation limits the exam. Interval small chronic appearing right occipital infarct. Mild atrophy. Patchy white matter hypodensity most likely chronic small vessel ischemic change. The ventricles are non enlarged. Vascular: No hyperdense vessels.  No unexpected calcification Skull: No  definitive fracture but limited by motion Sinuses/Orbits: No acute finding. Other: None IMPRESSION: 1. Mild  motion degradation limits the exam. No definite CT evidence for acute intracranial abnormality. 2. Atrophy and chronic small vessel ischemic changes of the white matter. Interval small chronic appearing right occipital infarct, new compared to 2021 head CT. Electronically Signed   By: Esmeralda Hedge M.D.   On: 05/10/2023 16:53    PROCEDURES:  Critical Care performed: Yes, see critical care procedure note(s)  CRITICAL CARE Performed by: Claria Crofts   Total critical care time: 40 minutes  Critical care time was exclusive of separately billable procedures and treating other patients.  Critical care was necessary to treat or prevent imminent or life-threatening deterioration.  Critical care was time spent personally by me on the following activities: development of treatment plan with patient and/or surrogate as well as nursing, discussions with consultants, evaluation of patient's response to treatment, examination of patient, obtaining history from patient or surrogate, ordering and performing treatments and interventions, ordering and review of laboratory studies, ordering and review of radiographic studies, pulse oximetry and re-evaluation of patient's condition.   Procedures   MEDICATIONS ORDERED IN ED: Medications  phenylephrine  (NEO-SYNEPHRINE) 20mg /NS 250mL premix infusion (30 mcg/min Intravenous Infusion Verify 05/10/23 2300)  docusate sodium  (COLACE) capsule 100 mg (has no administration in time range)  polyethylene glycol (MIRALAX  / GLYCOLAX ) packet 17 g (has no administration in time range)  insulin  aspart (novoLOG ) injection 0-15 Units ( Subcutaneous Not Given 05/10/23 2338)  atorvastatin  (LIPITOR) tablet 10 mg (has no administration in time range)  levothyroxine  (SYNTHROID ) tablet 25 mcg (has no administration in time range)  rOPINIRole  (REQUIP ) tablet 1 mg (1 mg Oral Given 05/10/23 2327)  pantoprazole  (PROTONIX ) injection 40 mg (40 mg Intravenous Given 05/10/23 2327)   acetaminophen  (OFIRMEV ) IV 1,000 mg (1,000 mg Intravenous New Bag/Given 05/10/23 2343)  fentaNYL  (SUBLIMAZE ) injection 25 mcg (25 mcg Intravenous Given 05/10/23 2328)  Chlorhexidine  Gluconate Cloth 2 % PADS 6 each (has no administration in time range)  morphine  (PF) 4 MG/ML injection 4 mg (4 mg Intravenous Given 05/10/23 1634)     IMPRESSION / MDM / ASSESSMENT AND PLAN / ED COURSE  I reviewed the triage vital signs and the nursing notes.  Differential diagnosis includes, but is not limited to, intracranial bleed, spine fracture, no focal tenderness suggestive of thoracoabdominal or extremity injury  Patient's presentation is most consistent with acute presentation with potential threat to life or bodily function.  83 year old male presenting after a fall with generalized weakness.  CT head and C-spine ordered from triage.  Patient now with multiple unwitnessed falls over the past few days.  Will obtain labs, EKG, x-Cheridan Kibler to further evaluate.  Ordered for morphine  for pain control.  Clinical Course as of 05/10/23 2346  Sun May 10, 2023  1813 CT imaging overall reassuring.  Patient remains without focal tenderness on exam, but on reassessment does seem to be complaining more of pain in his mid and lower back.  Did attempt to have the patient's stand up and walk, but had weakness with attempts to hold the walker and was unable to take steps.  On reevaluation, he does have some continued generalized weakness, but does appear to have weakened strength over his left upper extremity compared to the contralateral side with a inconsistent neurologic exam. Now reporting intact but decreased sensation over his left side. Consideration for central cord syndrome.  Also with unilateral weakness, consideration for CVA.  Will obtain MRI brain and  C-spine to further evaluate. [NR]  2015 MR BRAIN WO CONTRAST Formal radiology read read pending but concerning for right-sided infarct on my review.  Patient reassessed.   On exam, keenly aware, correctly answers month and age, able to blink eyes and squeeze hands, normal extraocular movements, no visual field loss, normal facial symmetry, no arm or leg motor drift, mild bilateral upper extremity ataxia, normal sensation, no aphasia, no dysarthria, no inattention. NIH 2.   [NR]  2031 MR BRAIN WO CONTRAST Formal radiology read notes remote right PCA infarct, no acute infarct noted. [NR]  2048 MR Cervical Spine Wo Contrast MRI C-spine demonstrates traumatic disc injury at the C6-C7 level.  Neurosurgery consulted. [NR]  2131 Case discussed with Dr. Debrah Fan with neurosurgery.  He recommends elevated mappable at 85, ICU admission with pressors as needed.  Recommend keeping the patient n.p.o. as he will likely need surgical intervention. [NR]  2139 Repeat neurologic exam performed.  Remains with weakness of bilateral upper and lower extremities, but does appear improved from my prior exams.  4/5 strength in bilateral upper and lower extremities, weaker with grip strength over the left upper extremity compared to the contralateral side.  Sensation remains intact.  Remains immobilized in [place] J collar.  Patient updated on recommendation for ICU admission.  Case discussed with NP Ouma with ICU team.  Given history of A-fib, recommends phenylephrine  to maintain MAP above 85 which has been ordered. [NR]    Clinical Course User Index [NR] Claria Crofts, MD     FINAL CLINICAL IMPRESSION(S) / ED DIAGNOSES   Final diagnoses:  Injury to ligament of cervical spine, initial encounter  Spinal cord compression [facility])     Rx / DC Orders   ED Discharge Orders     None        Note:  This document was prepared using Dragon voice recognition software and may include unintentional dictation errors.   Claria Crofts, MD 05/10/23 [phone]

## 2023-05-10 NOTE — H&P (Incomplete)
 NAME:  Billy Franco, MRN:  [id number], DOB:  Oct 23, 1940, LOS: 0 ADMISSION DATE:  05/10/2023, CONSULTATION DATE:  05/10/23 REFERRING MD:  Claria Crofts CHIEF COMPLAINT:  traumatic fall    HPI  83 y.o male with significant PMH of  T2DM, CKD stage III, HTN, HLD, HFrEF with EF 25-30%, refused AICD, ischemic Cardiomyopathy, A-Fib  not on anticoagulation due to gross hematuria, CAD s/p PCI and stent, GERD, RLS and chronic back pain who presented to the ED with chief complaints of bilateral upper and lower extremities weakness and numbness s/p fall.  Per family at the bedside, patient fell out of a folding chair on Friday and landed on his left side. Following the fall, he reported generalized weakness, numbness and difficulties grasping objects with his hands. He reported hitting his head without LOC.   ED Course: Initial vital signs showed HR of  86 beats/minute, BP 103/88 mm Hg, the RR 16 breaths/minute, and the oxygen saturation 100% on RA and a temperature of 97.19F (36.4C).  Pertinent Labs/Diagnostics Findings: Na+/ K+:137/4.0  Glucose:103 BUN/Cr.:37/1.41 WBC: 6.4 K/L Hgb/Hct:10.6/32.5 CXR> CTH> CTA Chest> CT Abd/pelvis> Medication administered in the ED: Disposition:  Past Medical History  T2DM, CKD stage III, HTN, HLD, HFrEF with EF 25-30%, refused AICD, ischemic Cardiomyopathy, A-Fib  not on anticoagulation due to gross hematuria, CAD s/p PCI and stent, GERD, RLS and chronic back pain   Significant Hospital Events   4/20:  Consults:  Neurosurgery  Procedures:    Significant Diagnostic Tests:  04/20 Chest Xray> IMPRESSION: No active disease. Borderline to mild cardiomegaly.  04/20 CT Cervical Spine> IMPRESSION: 1. Negative for acute fracture or subluxation of the cervical spine. 2. 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  04/20 CT THORACIC SPINE IMPRESSION IMPRESSION: 1. Multilevel degenerative changes without evidence  of acute fracture. 2. Trace bilateral pleural effusions with atelectasis. 3. Aortic atherosclerosis with coronary artery calcifications.   04/20 CT LUMBAR SPINE IMPRESSION 1. 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 2. Multilevel degenerative changes in the lumbar spine as described above. 3. Left renal calculi.  04/20 Noncontrast CT head> IMPRESSION: 1. Mild motion degradation limits the exam. No definite CT evidence for acute intracranial abnormality. 2. Atrophy and chronic small vessel ischemic changes of the white matter. Interval small chronic appearing right occipital infarct, new compared to 2021 head CT.  4/20 MRI Brain> IMPRESSION: 1. No evidence of acute intracranial abnormality. 2. Remote right PCA territory infarct.  4/20 MRI Cervical Spine> IMPRESSION: 1. 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 Edema extends posteriorly to involve the C6-C7 disc, compatible with traumatic disc injury. Small volume of adjacent prevertebral edema. 2. At C3-C4, severe canal stenosis with cord compression. T2 hyperintensity in the cord at this level is compatible with cord edema and/or myelomalacia. Severe bilateral foraminal stenosis at this level. 3. At C4-C5, moderate to severe canal stenosis and severe bilateral foraminal stenosis. 4. At C6-C7, mild to moderate canal stenosis and moderate bilateral foraminal stenosis.  Interim History / Subjective:      Micro Data:  4/20: MRSA PCR>>   Antimicrobials:  None  OBJECTIVE  Blood pressure (!) 128/59, pulse 62, temperature 98.1 F (36.7 C), temperature source Oral, resp. rate (!) 23, height 5\' 7"  (1.702 m), weight 66.7 kg, SpO2 100%.       No intake or output data in  the 24 hours ending 05/10/23 2252 Filed Weights   05/10/23 1542  Weight: 66.7 kg      Physical Examination  GENERAL:  year-old critically ill patient lying in the bed  EYES: PEERLA. No scleral icterus. Extraocular muscles intact.  HEENT: Head atraumatic, normocephalic. Oropharynx and nasopharynx clear.  NECK:  No JVD, supple  LUNGS: Normal breath sounds bilaterally.  No use of accessory muscles of respiration.  CARDIOVASCULAR: S1, S2 normal. No murmurs, rubs, or gallops.  ABDOMEN: Soft, NTND EXTREMITIES: No swelling or erythema.  Capillary refill < 3 seconds in all extremities. Pulses palpable distally. NEUROLOGIC: The patient is . No focal neurological deficit appreciated. Cranial nerves are intact.  SKIN: No obvious rash, lesion, or ulcer. Warm to touch Labs/imaging that I {ACTIONS; HAVE/HAVE NOT:19434}personally reviewed  (right click and "Reselect all SmartList Selections" daily)     Labs   CBC: Recent Labs  Lab 05/10/23 1621  WBC 6.4  NEUTROABS 4.5  HGB 10.6*  HCT 32.5*  MCV 98.5  PLT 210    Basic Metabolic Panel: Recent Labs  Lab 05/10/23 1621  NA 137  K 4.0  CL 102  CO2 25  GLUCOSE 103*  BUN 37*  CREATININE 1.41*  CALCIUM  9.8   GFR: Estimated Creatinine Clearance: 37.8 mL/min (A) (by C-G formula based on SCr of 1.41 mg/dL (H)). Recent Labs  Lab 05/10/23 1621  WBC 6.4    Liver Function Tests: Recent Labs  Lab 05/10/23 1621  AST 30  ALT 22  ALKPHOS 57  BILITOT 1.0  PROT 7.1  ALBUMIN 4.0   No results for input(s): "LIPASE", "AMYLASE" in the last 168 hours. No results for input(s): "AMMONIA" in the last 168 hours.  ABG    Component Value Date/Time   TCO2 26 04/15/2008 2244     Coagulation Profile: No results for input(s): "INR", "PROTIME" in the last 168 hours.  Cardiac Enzymes: Recent Labs  Lab 05/10/23 1621  CKTOTAL 292    HbA1C: Hemoglobin A1C  Date/Time Value Ref Range Status  02/02/2023 08:50 AM 7.8 (A) 4.0 - 5.6 % Final  09/15/2022 08:38 AM 7.3 (A) 4.0 - 5.6 % Final   HbA1c, POC (controlled diabetic  range)  Date/Time Value Ref Range Status  01/04/2018 09:16 AM 6.4 0.0 - 7.0 % Final   Hgb A1c MFr Bld  Date/Time Value Ref Range Status  05/06/2021 11:21 AM 6.9 (H) <5.7 % of total Hgb Final    Comment:    For someone without known diabetes, a hemoglobin A1c value of 6.5% or greater indicates that they may have  diabetes and this should be confirmed with a follow-up  test. . For someone with known diabetes, a value <7% indicates  that their diabetes is well controlled and a value  greater than or equal to 7% indicates suboptimal  control. A1c targets should be individualized based on  duration of diabetes, age, comorbid conditions, and  other considerations. . Currently, no consensus exists regarding use of hemoglobin A1c for diagnosis of diabetes for children. Aaron Aas   09/10/2020 09:25 AM 6.4 (H) <5.7 % of total Hgb Final    Comment:    For someone without known diabetes, a hemoglobin  A1c value between 5.7% and 6.4% is consistent with prediabetes and should be confirmed with a  follow-up test. . For someone with known diabetes, a value <7% indicates that their diabetes is well controlled. A1c targets should be individualized based on duration of diabetes, age, comorbid conditions, and other considerations. Aaron Aas  This assay result is consistent with an increased risk of diabetes. . Currently, no consensus exists regarding use of hemoglobin A1c for diagnosis of diabetes for children. .     CBG: Recent Labs  Lab 05/10/23 2239  GLUCAP 77    Review of Systems:   ***  Past Medical History  He,  has a past medical history of Arthritis, Atrial fibrillation (HCC), CHF (congestive heart failure) (HCC), CKD (chronic kidney disease), stage III (HCC), Complication of anesthesia, Diabetes mellitus without complication (HCC), GERD (gastroesophageal reflux disease), Hypertension, Hypothyroidism, and Nephrolithiasis.   Surgical History    Past Surgical History:  Procedure  Laterality Date   CATARACT EXTRACTION W/PHACO Right 05/07/2021   Procedure: CATARACT EXTRACTION PHACO AND INTRAOCULAR LENS PLACEMENT (IOC) RIGHT DIABETIC 8.63 00:56.5;  Surgeon: Clair Crews, MD;  Location: [facility];  Service: Ophthalmology;  Laterality: Right;  Diabetic   CATARACT EXTRACTION W/PHACO Left 05/21/2021   Procedure: CATARACT EXTRACTION PHACO AND INTRAOCULAR LENS PLACEMENT (IOC) LEFT DIABETIC 7.27 01:01.5;  Surgeon: Clair Crews, MD;  Location: [facility];  Service: Ophthalmology;  Laterality: Left;  Diabetic   CORONARY STENT INTERVENTION N/A 03/08/2020   Procedure: CORONARY STENT INTERVENTION;  Surgeon: Antonette Batters, MD;  Location: [facility];  Service: Cardiovascular;  Laterality: N/A;   LEFT HEART CATH AND CORONARY ANGIOGRAPHY Left 03/08/2020   Procedure: LEFT HEART CATH AND CORONARY ANGIOGRAPHY;  Surgeon: Antonette Batters, MD;  Location: [facility] CV [facility];  Service: Cardiovascular;  Laterality: Left;   NASAL FRACTURE SURGERY  1994   URETEROLITHOTOMY Right 2015     Social History   reports that he has never smoked. He has never used smokeless tobacco. He reports that he does not drink alcohol and does not use drugs.   Family History   His family history includes Heart disease in his father.   Allergies Allergies  Allergen Reactions   Codeine Rash and Other (See Comments)    Per pt "hard on my kidneys"     Lovastatin Rash and Other (See Comments)   Tape Rash     Home Medications  Prior to Admission medications   Medication Sig Start Date End Date Taking? Authorizing Provider  acetaminophen  (TYLENOL ) 650 MG CR tablet Take 650 mg by mouth every 8 (eight) hours as needed for pain.   Yes [provider]  atorvastatin  (LIPITOR) 10 MG tablet Take 1 tablet (10 mg total) by mouth daily at 6 PM. 02/02/23  Yes Sowles, Krichna, MD  carvedilol  (COREG ) 3.125 MG tablet Take 1 tablet (3.125 mg total) by mouth 2 (two) times daily  with a meal. 12/25/19 05/10/23 Yes Magdalene School, MD  clopidogrel  (PLAVIX ) 75 MG tablet Take 75 mg by mouth daily. 03/04/23  Yes [provider]  furosemide  (LASIX ) 40 MG tablet Take 40 mg by mouth daily. 05/03/23  Yes [provider]  glipiZIDE  2.5 MG TABS Take 1 tablet by mouth daily with breakfast. 02/02/23  Yes Sowles, Krichna, MD  levothyroxine  (SYNTHROID ) 25 MCG tablet TAKE 1 TABLET (25 MCG TOTAL) BY MOUTH DAILY BEFORE BREAKFAST. ON SUNDAYS, TAKE 2 TABLETS BY MOUTH BEFORE BREAKFAST. 04/27/23  Yes Sowles, Krichna, MD  losartan  (COZAAR ) 50 MG tablet Take 50 mg by mouth daily. 05/03/23  Yes [provider]  magnesium  oxide (MAG-OX) 400 MG tablet Take 400 mg by mouth daily.   Yes [provider]  metaxalone (SKELAXIN) 800 MG tablet May take 1/2-1 whole tablet up to 2 times daily as needed  for pain/spasm. 04/14/22  Yes [provider]  metFORMIN  (GLUCOPHAGE -XR) 750 MG 24 hr tablet Take 2 tablets (1,500 mg total) by mouth daily with breakfast. 02/02/23  Yes Sowles, Krichna, MD  pantoprazole  (PROTONIX ) 40 MG tablet Take 1 tablet (40 mg total) by mouth daily. 11/10/22 11/05/23 Yes Brigitte Canard, PA-C  rOPINIRole  (REQUIP ) 1 MG tablet Take 1 tablet (1 mg total) by mouth 2 (two) times daily. 02/02/23  Yes Sowles, Krichna, MD  spironolactone (ALDACTONE) 25 MG tablet Take 12.5 mg by mouth daily.   Yes Callwood, Dwayne D, MD  triamcinolone  (KENALOG ) 0.1 % Apply 1 application topically 2 (two) times daily as needed (rash).   Yes [provider]  aluminum hydroxide-magnesium  carbonate (GAVISCON) 95-358 MG/15ML SUSP Take by mouth.    [provider]  blood glucose meter kit and supplies 1 each by Other route as directed. Dispense based on patient and insurance preference. Use up to four times daily as directed. (FOR ICD-10 E10.9, E11.9). 09/09/21   Sowles, Krichna, MD  Cholecalciferol (VITAMIN D -1000 MAX ST) 25 MCG (1000 UT) tablet Take 1 tablet by  mouth daily.    [provider]  Cyanocobalamin (B-12) 1000 MCG TBCR Take 1,000 mcg by mouth every other day. 01/02/20   [provider]  dapagliflozin  propanediol (FARXIGA ) 10 MG TABS tablet Take by mouth. 01/10/22   [provider]  docusate sodium  (COLACE) 50 MG capsule Take 100 mg by mouth 2 (two) times daily as needed for mild constipation.    [provider]  fexofenadine-pseudoephedrine (ALLEGRA-D 24) 180-240 MG 24 hr tablet Take 1 tablet by mouth daily as needed (allergies).    [provider]  furosemide  (LASIX ) 20 MG tablet Take 40 mg by mouth daily. Patient not taking: Reported on 05/10/2023 01/02/20   Callwood, Creig Doe D, MD  glucose blood (ONETOUCH ULTRA) test strip USE AS DIRECTED TO CHECK BLOOD GLUCOSE ONCE DAILY. 05/12/22   Sowles, Krichna, MD  Lancets [facility] DELICA PLUS [place]) MISC CHECK BLOOD SUGAR AT NOON 03/02/23   Sowles, Krichna, MD  losartan  (COZAAR ) 25 MG tablet Take 1 tablet (25 mg total) by mouth at bedtime. Patient not taking: Reported on 05/10/2023 12/25/19   Chatterjee, Srobona Tublu, MD  Nutritional Supplements (THERALITH XR PO) Take by mouth QID. 3.752-45-45-49.5mg . Take 2 in the evening and 2 at bedtime.    [provider]  polyethylene glycol powder (GLYCOLAX /MIRALAX ) 17 GM/SCOOP powder Take 17 g by mouth daily. 11/10/22   Brigitte Canard, PA-C  tamsulosin  (FLOMAX ) 0.4 MG CAPS capsule Take 1 capsule (0.4 mg total) by mouth every evening. 09/08/22   Geraline Knapp, MD      Active Hospital Problem list   See systems below  Assessment & Plan:      Best practice:  Diet:  {ZOXW:96045} Pain/Anxiety/Delirium protocol (if indicated): {Pain/Anxiety/Delirium:26941} VAP protocol (if indicated): {VAP:29640} DVT prophylaxis: {DVT Prophylaxis:26933} GI prophylaxis: {GI:26934} Glucose control:  {Glucose Control:26935} Central venous access:  {Central Venous Access:26936} Arterial line:  {Central Venous  Access:26936} Foley:  {Central Venous Access:26936} Mobility:  {Mobility:26937}  PT consulted: {PT Consult:26938} Last date of multidisciplinary goals of care discussion [***] Code Status:  {Code Status:26939} Disposition: ***   = Goals of Care = Code Status Order: @CODE @   Primary Emergency ContactBlayn, [place], Home Phone: [phone] Wishes to pursue full aggressive treatment and intervention options, including CPR and intubation, but goals of care will be addressed on going with family if that should become necessary. Patient wishes to pursue ongoing treatment  with relatively conservative measures (e.g., IV fluids, antibiotics), but would not wish to escalate to ICU level of care or other invasive measures. Patient wishes to pursue ongoing treatment, but concurred that if deteriorated to pulselessness, patient would prefer a natural death as opposed to invasive measures such as CPR and intubation.  Family should be immediately contacted in such situations.  Critical care time: 45 minutes        Alonza Arthurs DNP, CCRN, FNP-C, AGACNP-BC Acute Care & Family Nurse Practitioner [facility] P[facility]M on call pager [phone]

## 2023-05-10 NOTE — ED Notes (Signed)
 Per daughter, pt had a fall on Friday.

## 2023-05-10 NOTE — ED Notes (Signed)
 Pt placed in C-Collar per Dr Synetta Eves.

## 2023-05-10 NOTE — ED Triage Notes (Signed)
 First Nurse Note:  Pt via [facility] from home. Pt was sitting outside became weak all over and slid out of the chair onto his L side. Denies any pain.  Pt c/o generalized weakness but states it is starting to resolve. Denies LOC. Denies head injury. Pt is A&Ox4 and NAD  128/59 76 HR  19 RR  89 CBG

## 2023-05-10 NOTE — Progress Notes (Signed)
 Discussed with ED. Patient had a fall per family report, maybe Friday. Has had some weakness. He is on ED exam, 4/5 in extremities with some more weakness distally in hands. Also some left sided numbness. His MRI showed chronic compression and stenosis at C3/4 and a new fracture/disc herniation at C5/6 with moderate stenosis inbetween these levels. He is in a collar and given the exam, he will benefit from perfusion and have recommended MAP blood pressure > 85mm Hg and arterial line to monitor this with frequent neuro checks. We will admit to ICU

## 2023-05-11 ENCOUNTER — Inpatient Hospital Stay

## 2023-05-11 ENCOUNTER — Other Ambulatory Visit: Payer: Self-pay

## 2023-05-11 ENCOUNTER — Inpatient Hospital Stay: Admitting: Anesthesiology

## 2023-05-11 ENCOUNTER — Encounter
Admission: EM | Disposition: A | Discharge status: Skilled Nursing Facility | Payer: Self-pay | Source: Home / Self Care | Attending: Pulmonary Disease

## 2023-05-11 DIAGNOSIS — G959 Disease of spinal cord, unspecified: Secondary | ICD-10-CM | POA: Diagnosis not present

## 2023-05-11 DIAGNOSIS — S14129A Central cord syndrome at unspecified level of cervical spinal cord, initial encounter: Secondary | ICD-10-CM | POA: Diagnosis present

## 2023-05-11 DIAGNOSIS — M4802 Spinal stenosis, cervical region: Secondary | ICD-10-CM | POA: Diagnosis present

## 2023-05-11 DIAGNOSIS — G9589 Other specified diseases of spinal cord: Secondary | ICD-10-CM | POA: Diagnosis present

## 2023-05-11 DIAGNOSIS — W19XXXA Unspecified fall, initial encounter: Secondary | ICD-10-CM

## 2023-05-11 DIAGNOSIS — S14151A Other incomplete lesion at C1 level of cervical spinal cord, initial encounter: Secondary | ICD-10-CM | POA: Diagnosis present

## 2023-05-11 HISTORY — PX: ANTERIOR CERVICAL DECOMP/DISCECTOMY FUSION: SHX1161

## 2023-05-11 LAB — CBC
HCT: 36.3 % — ABNORMAL LOW (ref 39.0–52.0)
Hemoglobin: 12 g/dL — ABNORMAL LOW (ref 13.0–17.0)
MCH: 31.8 pg (ref 26.0–34.0)
MCHC: 33.1 g/dL (ref 30.0–36.0)
MCV: 96.3 fL (ref 80.0–100.0)
Platelets: 262 10*3/uL (ref 150–400)
RBC: 3.77 MIL/uL — ABNORMAL LOW (ref 4.22–5.81)
RDW: 12.7 % (ref 11.5–15.5)
WBC: 9.2 10*3/uL (ref 4.0–10.5)
nRBC: 0 % (ref 0.0–0.2)

## 2023-05-11 LAB — TYPE AND SCREEN

## 2023-05-11 LAB — GLUCOSE, CAPILLARY
Glucose-Capillary: 73 mg/dL (ref 70–99)
Glucose-Capillary: 109 mg/dL — ABNORMAL HIGH (ref 70–99)
Glucose-Capillary: 78 mg/dL (ref 70–99)
Glucose-Capillary: 101 mg/dL — ABNORMAL HIGH (ref 70–99)
Glucose-Capillary: 125 mg/dL — ABNORMAL HIGH (ref 70–99)

## 2023-05-11 LAB — BASIC METABOLIC PANEL WITH GFR
Anion gap: 12 (ref 5–15)
BUN: 32 mg/dL — ABNORMAL HIGH (ref 8–23)
CO2: 23 mmol/L (ref 22–32)
Calcium: 9.4 mg/dL (ref 8.9–10.3)
Chloride: 103 mmol/L (ref 98–111)
Creatinine, Ser: 1.4 mg/dL — ABNORMAL HIGH (ref 0.61–1.24)
GFR, Estimated: 50 mL/min — ABNORMAL LOW (ref >60)
Glucose, Bld: 84 mg/dL (ref 70–99)
Potassium: 3.8 mmol/L (ref 3.5–5.1)
Sodium: 138 mmol/L (ref 135–145)

## 2023-05-11 LAB — MRSA NEXT GEN BY PCR, NASAL: MRSA by PCR Next Gen: NOT DETECTED

## 2023-05-11 LAB — ABO/RH: ABO/RH(D): B POS

## 2023-05-11 LAB — MAGNESIUM: Magnesium: 1.9 mg/dL (ref 1.7–2.4)

## 2023-05-11 LAB — PHOSPHORUS: Phosphorus: 3.2 mg/dL (ref 2.5–4.6)

## 2023-05-11 SURGERY — ANTERIOR CERVICAL DECOMPRESSION/DISCECTOMY FUSION 2 LEVELS
Anesthesia: General

## 2023-05-11 MED ORDER — METHOCARBAMOL 1000 MG/10ML IJ SOLN
500.0000 mg | Freq: Four times a day (QID) | INTRAMUSCULAR | Status: DC | PRN
  Administered 2023-05-11 – 2023-05-12 (×2): 500 mg via INTRAVENOUS
  Filled 2023-05-11 (×3): qty 5

## 2023-05-11 MED ORDER — LEVOTHYROXINE SODIUM 50 MCG PO TABS
25.0000 ug | ORAL_TABLET | ORAL | Status: DC
Start: 2023-05-12 — End: 2023-05-13
  Administered 2023-05-12 – 2023-05-13 (×2): 25 ug via ORAL
  Filled 2023-05-11 (×2): qty 1

## 2023-05-11 MED ORDER — SODIUM CHLORIDE 0.9 % IV SOLN
INTRAVENOUS | Status: DC | PRN
Start: 2023-05-11 — End: 2023-05-11

## 2023-05-11 MED ORDER — DROPERIDOL 2.5 MG/ML IJ SOLN: 0.6250 mg | Freq: Once | INTRAMUSCULAR | Status: DC | PRN

## 2023-05-11 MED ORDER — GLYCOPYRROLATE 0.2 MG/ML IJ SOLN
INTRAMUSCULAR | Status: AC
  Filled 2023-05-11: qty 1

## 2023-05-11 MED ORDER — PROPOFOL 1000 MG/100ML IV EMUL
INTRAVENOUS | Status: AC
  Filled 2023-05-11: qty 100

## 2023-05-11 MED ORDER — FENTANYL CITRATE (PF) 100 MCG/2ML IJ SOLN
INTRAMUSCULAR | Status: AC
  Filled 2023-05-11: qty 2

## 2023-05-11 MED ORDER — CEFAZOLIN SODIUM 1 G IJ SOLR
INTRAMUSCULAR | Status: AC
  Filled 2023-05-11: qty 20

## 2023-05-11 MED ORDER — SUCCINYLCHOLINE CHLORIDE 200 MG/10ML IV SOSY
PREFILLED_SYRINGE | INTRAVENOUS | Status: DC | PRN
  Administered 2023-05-11: 100 mg via INTRAVENOUS

## 2023-05-11 MED ORDER — CEFAZOLIN SODIUM-DEXTROSE 2-3 GM-%(50ML) IV SOLR
INTRAVENOUS | Status: DC | PRN
  Administered 2023-05-11: 2 g via INTRAVENOUS

## 2023-05-11 MED ORDER — OXYCODONE HCL 5 MG PO TABS: 5.0000 mg | ORAL_TABLET | Freq: Once | ORAL | Status: DC | PRN

## 2023-05-11 MED ORDER — SODIUM CHLORIDE 0.9% IV SOLUTION: Freq: Once | INTRAVENOUS | Status: AC

## 2023-05-11 MED ORDER — BUPIVACAINE-EPINEPHRINE (PF) 0.5% -1:200000 IJ SOLN
INTRAMUSCULAR | Status: DC | PRN
  Administered 2023-05-11: 9 mL

## 2023-05-11 MED ORDER — REMIFENTANIL HCL 1 MG IV SOLR
INTRAVENOUS | Status: AC
  Filled 2023-05-11: qty 1000

## 2023-05-11 MED ORDER — DEXMEDETOMIDINE HCL IN NACL 80 MCG/20ML IV SOLN
INTRAVENOUS | Status: DC | PRN
  Administered 2023-05-11: 8 ug via INTRAVENOUS

## 2023-05-11 MED ORDER — OXYCODONE HCL 5 MG/5ML PO SOLN: 5.0000 mg | Freq: Once | ORAL | Status: DC | PRN

## 2023-05-11 MED ORDER — 0.9 % SODIUM CHLORIDE (POUR BTL) OPTIME
TOPICAL | Status: DC | PRN
  Administered 2023-05-11: 275 mL

## 2023-05-11 MED ORDER — ETOMIDATE 2 MG/ML IV SOLN
INTRAVENOUS | Status: DC | PRN
  Administered 2023-05-11: 20 mg via INTRAVENOUS

## 2023-05-11 MED ORDER — ACETAMINOPHEN 325 MG PO TABS: 650.0000 mg | ORAL_TABLET | ORAL | Status: DC | PRN

## 2023-05-11 MED ORDER — SURGIFLO WITH THROMBIN (HEMOSTATIC MATRIX KIT) OPTIME
TOPICAL | Status: DC | PRN
Start: 2023-05-11 — End: 2023-05-11
  Administered 2023-05-11: 1 via TOPICAL

## 2023-05-11 MED ORDER — REMIFENTANIL HCL 1 MG IV SOLR
INTRAVENOUS | Status: DC | PRN
  Administered 2023-05-11: .1 ug/kg/min via INTRAVENOUS

## 2023-05-11 MED ORDER — PHENOL 1.4 % MT LIQD
1.0000 | OROMUCOSAL | Status: DC | PRN
  Administered 2023-05-12: 1 via OROMUCOSAL
  Filled 2023-05-11: qty 177

## 2023-05-11 MED ORDER — LIDOCAINE HCL (CARDIAC) PF 100 MG/5ML IV SOSY
PREFILLED_SYRINGE | INTRAVENOUS | Status: DC | PRN
  Administered 2023-05-11: 100 mg via INTRAVENOUS

## 2023-05-11 MED ORDER — ENOXAPARIN SODIUM 40 MG/0.4ML IJ SOSY
40.0000 mg | PREFILLED_SYRINGE | INTRAMUSCULAR | Status: DC
  Administered 2023-05-12: 40 mg via SUBCUTANEOUS
  Filled 2023-05-11: qty 0.4

## 2023-05-11 MED ORDER — ACETAMINOPHEN 10 MG/ML IV SOLN: 1000.0000 mg | Freq: Once | INTRAVENOUS | Status: DC | PRN

## 2023-05-11 MED ORDER — ONDANSETRON HCL 4 MG PO TABS: 4.0000 mg | ORAL_TABLET | Freq: Four times a day (QID) | ORAL | Status: DC | PRN

## 2023-05-11 MED ORDER — MENTHOL 3 MG MT LOZG
1.0000 | LOZENGE | OROMUCOSAL | Status: DC | PRN
  Administered 2023-05-11: 3 mg via ORAL
  Filled 2023-05-11: qty 9

## 2023-05-11 MED ORDER — SODIUM CHLORIDE 0.9% IV SOLUTION: Freq: Once | INTRAVENOUS | Status: DC

## 2023-05-11 MED ORDER — FENTANYL CITRATE (PF) 100 MCG/2ML IJ SOLN: 25.0000 ug | INTRAMUSCULAR | Status: DC | PRN

## 2023-05-11 MED ORDER — GLYCOPYRROLATE 0.2 MG/ML IJ SOLN
INTRAMUSCULAR | Status: DC | PRN
  Administered 2023-05-11: .2 mg via INTRAVENOUS

## 2023-05-11 MED ORDER — ACETAMINOPHEN 10 MG/ML IV SOLN
INTRAVENOUS | Status: DC | PRN
  Administered 2023-05-11: 1000 mg via INTRAVENOUS

## 2023-05-11 MED ORDER — PROPOFOL 500 MG/50ML IV EMUL
INTRAVENOUS | Status: DC | PRN
Start: 2023-05-11 — End: 2023-05-11
  Administered 2023-05-11: 100 ug/kg/min via INTRAVENOUS
  Administered 2023-05-11: 75 ug/kg/min via INTRAVENOUS

## 2023-05-11 MED ORDER — SENNA 8.6 MG PO TABS
1.0000 | ORAL_TABLET | Freq: Two times a day (BID) | ORAL | Status: DC
  Administered 2023-05-11 – 2023-05-22 (×15): 8.6 mg via ORAL
  Filled 2023-05-11 (×17): qty 1

## 2023-05-11 MED ORDER — METHOCARBAMOL 500 MG PO TABS: 500.0000 mg | ORAL_TABLET | Freq: Four times a day (QID) | ORAL | Status: DC | PRN

## 2023-05-11 MED ORDER — LEVOTHYROXINE SODIUM 50 MCG PO TABS: 50.0000 ug | ORAL_TABLET | ORAL | Status: DC

## 2023-05-11 MED ORDER — ONDANSETRON HCL 4 MG/2ML IJ SOLN: 4.0000 mg | Freq: Four times a day (QID) | INTRAMUSCULAR | Status: DC | PRN

## 2023-05-11 MED ORDER — ACETAMINOPHEN 500 MG PO TABS: 1000.0000 mg | ORAL_TABLET | Freq: Four times a day (QID) | ORAL | Status: DC

## 2023-05-11 MED ORDER — OXYCODONE HCL 5 MG PO TABS
10.0000 mg | ORAL_TABLET | ORAL | Status: DC | PRN
  Administered 2023-05-12 (×2): 10 mg via ORAL
  Filled 2023-05-11 (×2): qty 2

## 2023-05-11 MED ORDER — DEXAMETHASONE SODIUM PHOSPHATE 10 MG/ML IJ SOLN
INTRAMUSCULAR | Status: DC | PRN
  Administered 2023-05-11: 5 mg via INTRAVENOUS

## 2023-05-11 MED ORDER — FENTANYL CITRATE (PF) 100 MCG/2ML IJ SOLN
INTRAMUSCULAR | Status: DC | PRN
Start: 2023-05-11 — End: 2023-05-11
  Administered 2023-05-11: 50 ug via INTRAVENOUS
  Administered 2023-05-11 (×2): 25 ug via INTRAVENOUS

## 2023-05-11 MED ORDER — MAGNESIUM CITRATE PO SOLN: 1.0000 | Freq: Once | ORAL | Status: DC | PRN

## 2023-05-11 MED ORDER — ACETAMINOPHEN 650 MG RE SUPP: 650.0000 mg | RECTAL | Status: DC | PRN

## 2023-05-11 MED ORDER — HYDROMORPHONE HCL 1 MG/ML IJ SOLN: 0.5000 mg | INTRAMUSCULAR | Status: AC | PRN

## 2023-05-11 MED ORDER — OXYCODONE HCL 5 MG PO TABS: 5.0000 mg | ORAL_TABLET | ORAL | Status: DC | PRN

## 2023-05-11 MED ORDER — ORAL CARE MOUTH RINSE: 15.0000 mL | OROMUCOSAL | Status: DC | PRN

## 2023-05-11 MED ORDER — CARVEDILOL 3.125 MG PO TABS
3.1250 mg | ORAL_TABLET | Freq: Two times a day (BID) | ORAL | Status: DC
  Administered 2023-05-11 – 2023-05-12 (×2): 3.125 mg via ORAL
  Filled 2023-05-11 (×2): qty 1

## 2023-05-11 MED ORDER — ACETAMINOPHEN 500 MG PO TABS
1000.0000 mg | ORAL_TABLET | Freq: Four times a day (QID) | ORAL | Status: DC
  Administered 2023-05-12 – 2023-05-13 (×6): 1000 mg via ORAL
  Filled 2023-05-11 (×5): qty 2

## 2023-05-11 SURGICAL SUPPLY — 41 items
BASIN KIT SINGLE STR (MISCELLANEOUS) ×1 IMPLANT
BUR NEURO DRILL SOFT 3.0X3.8M (BURR) ×1 IMPLANT
DERMABOND ADVANCED .7 DNX12 (GAUZE/BANDAGES/DRESSINGS) ×1 IMPLANT
DRAIN CHANNEL JP 10F RND 20C F (MISCELLANEOUS) IMPLANT
DRAPE C ARM PK CFD 31 SPINE (DRAPES) ×1 IMPLANT
DRAPE LAPAROTOMY 77X122 PED (DRAPES) ×1 IMPLANT
DRAPE MICROSCOPE SPINE 48X150 (DRAPES) ×1 IMPLANT
DRSG TEGADERM 4X4.75 (GAUZE/BANDAGES/DRESSINGS) IMPLANT
ELECTRODE REM PT RTRN 9FT ADLT (ELECTROSURGICAL) ×1 IMPLANT
EVACUATOR 1/8 PVC DRAIN (DRAIN) IMPLANT
EVACUATOR SILICONE 100CC (DRAIN) IMPLANT
FEE INTRAOP CADWELL SUPPLY NCS (MISCELLANEOUS) IMPLANT
FEE INTRAOP MONITOR IMPULS NCS (MISCELLANEOUS) IMPLANT
GAUZE SPONGE 2X2 STRL 8-PLY (GAUZE/BANDAGES/DRESSINGS) IMPLANT
GLOVE BIOGEL PI IND STRL 6.5 (GLOVE) ×1 IMPLANT
GLOVE SURG SYN 6.5 ES PF (GLOVE) ×1 IMPLANT
GLOVE SURG SYN 6.5 PF PI (GLOVE) ×1 IMPLANT
GLOVE SURG SYN 8.5 E (GLOVE) ×3 IMPLANT
GLOVE SURG SYN 8.5 PF PI (GLOVE) ×3 IMPLANT
GOWN SRG LRG LVL 4 IMPRV REINF (GOWNS) ×1 IMPLANT
GOWN SRG XL LVL 3 NONREINFORCE (GOWNS) ×1 IMPLANT
KIT TURNOVER KIT A (KITS) ×1 IMPLANT
MANIFOLD NEPTUNE II (INSTRUMENTS) ×1 IMPLANT
NDL SAFETY ECLIPSE 18X1.5 (NEEDLE) ×1 IMPLANT
NS IRRIG 500ML POUR BTL (IV SOLUTION) ×1 IMPLANT
PACK LAMINECTOMY ARMC (PACKS) ×1 IMPLANT
PAD ARMBOARD POSITIONER FOAM (MISCELLANEOUS) ×2 IMPLANT
PIN CASPAR 14 (PIN) ×1 IMPLANT
PIN CASPAR 14MM (PIN) ×1 IMPLANT
PLATE ACP 1.6X38 2LVL (Plate) IMPLANT
SCREW ACP 3.5X17 S/D VARIA (Screw) IMPLANT
SPACER CERVICAL FRGE 12X14X7-7 (Spacer) IMPLANT
SPONGE KITTNER 5P (MISCELLANEOUS) ×1 IMPLANT
STAPLER SKIN PROX 35W (STAPLE) IMPLANT
SURGIFLO W/THROMBIN 8M KIT (HEMOSTASIS) ×1 IMPLANT
SUT STRATA 3-0 15 PS-2 (SUTURE) ×1 IMPLANT
SUT VIC AB 3-0 SH 8-18 (SUTURE) ×1 IMPLANT
SUTURE EHLN 3-0 FS-10 30 BLK (SUTURE) IMPLANT
SYR 20ML LL LF (SYRINGE) ×1 IMPLANT
TAPE CLOTH 3X10 WHT NS LF (GAUZE/BANDAGES/DRESSINGS) ×2 IMPLANT
TRAP FLUID SMOKE EVACUATOR (MISCELLANEOUS) ×1 IMPLANT

## 2023-05-11 NOTE — Transfer of Care (Signed)
 Immediate Anesthesia Transfer of Care Note  Patient: Billy Franco  Procedure(s) Performed: ANTERIOR CERVICAL DECOMPRESSION/DISCECTOMY FUSION 2 LEVELS  Patient Location: PACU  Anesthesia Type:General  Level of Consciousness: awake  Airway & Oxygen Therapy: Patient Spontanous Breathing and Patient connected to face mask oxygen  Post-op Assessment: Report given to RN and Post -op Vital signs reviewed and stable  Post vital signs: Reviewed and stable  Last Vitals:  Vitals Value Taken Time  BP    Temp    Pulse 66 05/11/23 1532  Resp 12 05/11/23 1531  SpO2 98 % 05/11/23 1531  Vitals shown include unfiled device data.  Last Pain:  Vitals:   05/11/23 1143  TempSrc:   PainSc: Asleep         Complications: There were no known notable events for this encounter.

## 2023-05-11 NOTE — Progress Notes (Signed)
 Report given to OR.

## 2023-05-11 NOTE — Consult Note (Signed)
 [facility] CLINIC CARDIOLOGY CONSULT NOTE       Patient ID: Billy Franco MRN: [id number] DOB/AGE: 1940/06/28 83 y.o.  Admit date: 05/10/2023 Referring Physician Dr. Duayne Gey Primary Physician Ava Lei, Arvis Laura, MD Primary Cardiologist Dr. Beau Bound Reason for Consultation Cardiac Clearance   HPI: Billy Franco is a 83 y.o. male  with a past medical history of CAD s/p stent, chronic systolic congestive heart failure (EF 25% - 30% - 04/2020), Ischemic Cardiomyopathy (refuses AICD), AF RVR (not on Eliquis  due to hematuria), T2DM, CKD, hypothyroidism who presented to the ED on 05/10/2023 for falling out of a chair on Friday. Patient is requiring surgical intervention and needs cardiac clearance. Cardiology was consulted for further evaluation.   Patient presented to the ED after falling out of a chair on Friday without syncope. Patient denies any chest pain or SOB prior to the fall. Work up in the ED notable for CT revealed fracture/disc herniation at C5/6 with moderate stenosis and chronic compression and stenosis at C3/4. EKG in ED with sinus rhythm with PACs, rate 67 bpm.  At the time of my evaluation this morning, patient was resting comfortably in ICU bed with family at bedside in no acute distress. C-collar in place. Patient states he's feeling good without chest pain or SOB. 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 Notable labs today with Na 138, K 3.8, Mg 1.2, Cr 1.40, Hgb 12, platelets 262. Per tele patient remains in sinus rhythm and BP, HR are stable.   Review of systems complete and found to be negative unless listed above    Past Medical History:  Diagnosis Date   Arthritis    Lower back, hips   Atrial fibrillation (HCC)    CHF (congestive heart failure) (HCC)    CKD (chronic kidney disease), stage III (HCC)    Complication of anesthesia    has "crooked airway"  Diff breathing after extubated (after kidney stone procedure 07/2013)   Diabetes mellitus  without complication (HCC)    GERD (gastroesophageal reflux disease)    Hypertension    Hypothyroidism    Nephrolithiasis     Past Surgical History:  Procedure Laterality Date   CATARACT EXTRACTION W/PHACO Right 05/07/2021   Procedure: CATARACT EXTRACTION PHACO AND INTRAOCULAR LENS PLACEMENT (IOC) RIGHT DIABETIC 8.63 00:56.5;  Surgeon: Clair Crews, MD;  Location: [facility];  Service: Ophthalmology;  Laterality: Right;  Diabetic   CATARACT EXTRACTION W/PHACO Left 05/21/2021   Procedure: CATARACT EXTRACTION PHACO AND INTRAOCULAR LENS PLACEMENT (IOC) LEFT DIABETIC 7.27 01:01.5;  Surgeon: Clair Crews, MD;  Location: [facility];  Service: Ophthalmology;  Laterality: Left;  Diabetic   CORONARY STENT INTERVENTION N/A 03/08/2020   Procedure: CORONARY STENT INTERVENTION;  Surgeon: Antonette Batters, MD;  Location: [facility] INVASIVE CV LAB;  Service: Cardiovascular;  Laterality: N/A;   LEFT HEART CATH AND CORONARY ANGIOGRAPHY Left 03/08/2020   Procedure: LEFT HEART CATH AND CORONARY ANGIOGRAPHY;  Surgeon: Antonette Batters, MD;  Location: [facility] INVASIVE CV LAB;  Service: Cardiovascular;  Laterality: Left;   NASAL FRACTURE SURGERY  1994   URETEROLITHOTOMY Right 2015    Medications Prior to Admission  Medication Sig Dispense Refill Last Dose/Taking   acetaminophen  (TYLENOL ) 650 MG CR tablet Take 650 mg by mouth every 8 (eight) hours as needed for pain.   05/10/2023 Morning   aluminum hydroxide-magnesium  carbonate (GAVISCON) 95-358 MG/15ML SUSP Take by mouth.   Taking   atorvastatin  (LIPITOR) 10 MG tablet Take 1 tablet (10 mg total) by  mouth daily at 6 PM. 90 tablet 1 05/09/2023 Evening   carvedilol  (COREG ) 3.125 MG tablet Take 1 tablet (3.125 mg total) by mouth 2 (two) times daily with a meal. 60 tablet 0 05/10/2023 Morning   Cholecalciferol (VITAMIN D -1000 MAX ST) 25 MCG (1000 UT) tablet Take 1 tablet by mouth daily.   Taking   clopidogrel  (PLAVIX ) 75 MG tablet Take 75 mg by mouth  daily.   05/10/2023 Morning   Cyanocobalamin (B-12) 1000 MCG TBCR Take 1,000 mcg by mouth every other day.   05/10/2023 Morning   docusate sodium  (COLACE) 50 MG capsule Take 100 mg by mouth 2 (two) times daily as needed for mild constipation.   Unknown   fexofenadine-pseudoephedrine (ALLEGRA-D 24) 180-240 MG 24 hr tablet Take 1 tablet by mouth daily as needed (allergies).   Unknown   furosemide  (LASIX ) 40 MG tablet Take 40 mg by mouth daily.   05/10/2023 Morning   glipiZIDE  2.5 MG TABS Take 1 tablet by mouth daily with breakfast. 90 tablet 1 Taking   levothyroxine  (SYNTHROID ) 25 MCG tablet TAKE 1 TABLET (25 MCG TOTAL) BY MOUTH DAILY BEFORE BREAKFAST. ON SUNDAYS, TAKE 2 TABLETS BY MOUTH BEFORE BREAKFAST. 102 tablet 1 05/10/2023 Morning   losartan  (COZAAR ) 25 MG tablet Take 1 tablet (25 mg total) by mouth at bedtime. 30 tablet 0 05/09/2023 Bedtime   magnesium  oxide (MAG-OX) 400 MG tablet Take 400 mg by mouth daily.   05/09/2023 Evening   metaxalone (SKELAXIN) 800 MG tablet Take 400 mg by mouth 2 (two) times daily.   05/09/2023 Bedtime   metFORMIN  (GLUCOPHAGE -XR) 750 MG 24 hr tablet Take 2 tablets (1,500 mg total) by mouth daily with breakfast. 180 tablet 1 05/10/2023 Morning   Nutritional Supplements (THERALITH XR PO) Take by mouth QID. 3.752-45-45-49.5mg . Take 2 in the evening and 2 at bedtime.   05/10/2023 Morning   pantoprazole  (PROTONIX ) 40 MG tablet Take 1 tablet (40 mg total) by mouth daily. 90 tablet 3 05/09/2023 Evening   polyethylene glycol powder (GLYCOLAX /MIRALAX ) 17 GM/SCOOP powder Take 17 g by mouth daily.   Unknown   rOPINIRole  (REQUIP ) 1 MG tablet Take 1 tablet (1 mg total) by mouth 2 (two) times daily. 180 tablet 1 05/09/2023 Bedtime   spironolactone (ALDACTONE) 25 MG tablet Take 12.5 mg by mouth daily.   05/10/2023   tamsulosin  (FLOMAX ) 0.4 MG CAPS capsule Take 1 capsule (0.4 mg total) by mouth every evening. 90 capsule 2 05/09/2023 Evening   triamcinolone  (KENALOG ) 0.1 % Apply 1 application  topically 2 (two) times daily as needed (rash).   Unknown   blood glucose meter kit and supplies 1 each by Other route as directed. Dispense based on patient and insurance preference. Use up to four times daily as directed. (FOR ICD-10 E10.9, E11.9). 1 each 1    glucose blood (ONETOUCH ULTRA) test strip USE AS DIRECTED TO CHECK BLOOD GLUCOSE ONCE DAILY. 100 strip 3    Lancets (ONETOUCH DELICA PLUS LANCET33G) MISC CHECK BLOOD SUGAR AT NOON 100 each 1    Social History   Socioeconomic History   Marital status: Married    Spouse name: [name]    Number of children: 3   Years of education: Not on file   Highest education level: Not on file  Occupational History   Occupation: retired     Comment: IT trainer at a [employer]   Tobacco Use   Smoking status: Never   Smokeless tobacco: Never  Vaping Use   Vaping status: Never  Used  Substance and Sexual Activity   Alcohol use: No    Alcohol/week: 0.0 standard drinks of alcohol   Drug use: No   Sexual activity: Not Currently  Other Topics Concern   Not on file  Social History Narrative   Married, had 3 children, one died at a MVA at age 1   [employer]eralynn [employer] of a church    Social Drivers of Corporate investment banker Strain: Low Risk  (01/24/2019)   Overall Financial Resource Strain (CARDIA)    Difficulty of Paying Living Expenses: Not hard at all  Food Insecurity: No Food Insecurity (01/24/2019)   Hunger Vital Sign    Worried About Running Out of Food in the Last Year: Never true    Ran Out of Food in the Last Year: Never true  Transportation Needs: No Transportation Needs (01/24/2019)   PRAPARE - Administrator, Civil Service (Medical): No    Lack of Transportation (Non-Medical): No  Physical Activity: Sufficiently Active (01/24/2019)   Exercise Vital Sign    Days of Exercise per Week: 5 days    Minutes of Exercise per Session: 60 min  Stress: No Stress Concern Present (01/24/2019)   [employer] of Occupational Health -  Occupational Stress Questionnaire    Feeling of Stress : Not at all  Social Connections: Socially Integrated (01/24/2019)   Social Connection and Isolation Panel [NHANES]    Frequency of Communication with Friends and Family: More than three times a week    Frequency of Social Gatherings with Friends and Family: More than three times a week    Attends Religious Services: More than 4 times per year    Active Member of Clubs or Organizations: Yes    Attends Banker Meetings: More than 4 times per year    Marital Status: Married  Catering manager Violence: Not At Risk (01/24/2019)   Humiliation, Afraid, Rape, and Kick questionnaire    Fear of Current or Ex-Partner: No    Emotionally Abused: No    Physically Abused: No    Sexually Abused: No    Family History  Problem Relation Age of Onset   Heart disease Father      Vitals:   05/11/23 0730 05/11/23 0745 05/11/23 0800 05/11/23 0815  BP: (!) 138/56 114/74 103/79 124/79  Pulse: 65 69 63 64  Resp: (!) 26 (!) 28 (!) 22 14  Temp:      TempSrc:      SpO2: 99% 99% 99% 99%  Weight:      Height:        PHYSICAL EXAM General: Well appearing elderly male, well nourished, in no acute distress. HEENT: Normocephalic and atraumatic. Neck: No JVD.   Lungs: Normal respiratory effort on room air. Clear bilaterally to auscultation. No wheezes, crackles, rhonchi.  Heart: HRRR. Normal S1 and S2 without gallops or murmurs.  Abdomen: Non-distended appearing.  Msk: Normal strength and tone for age. Extremities: Warm and well perfused. No clubbing, cyanosis. No edema.  Neuro: Alert and oriented X 3. Psych: Answers questions appropriately.   Labs: Basic Metabolic Panel: Recent Labs    05/10/23 1621 05/11/23 0300  NA 137 138  K 4.0 3.8  CL 102 103  CO2 25 23  GLUCOSE 103* 84  BUN 37* 32*  CREATININE 1.41* 1.40*  CALCIUM  9.8 9.4  MG  --  1.9  PHOS  --  3.2   Liver Function Tests: Recent Labs    05/10/23 1621  AST  30  ALT  22  ALKPHOS 57  BILITOT 1.0  PROT 7.1  ALBUMIN 4.0   No results for input(s): "LIPASE", "AMYLASE" in the last 72 hours. CBC: Recent Labs    05/10/23 1621 05/11/23 0300  WBC 6.4 9.2  NEUTROABS 4.5  --   HGB 10.6* 12.0*  HCT 32.5* 36.3*  MCV 98.5 96.3  PLT 210 262   Cardiac Enzymes: Recent Labs    05/10/23 1621  CKTOTAL 292   BNP: No results for input(s): "BNP" in the last 72 hours. D-Dimer: No results for input(s): "DDIMER" in the last 72 hours. Hemoglobin A1C: No results for input(s): "HGBA1C" in the last 72 hours. Fasting Lipid Panel: No results for input(s): "CHOL", "HDL", "LDLCALC", "TRIG", "CHOLHDL", "LDLDIRECT" in the last 72 hours. Thyroid  Function Tests: No results for input(s): "TSH", "T4TOTAL", "T3FREE", "THYROIDAB" in the last 72 hours.  Invalid input(s): "FREET3" Anemia Panel: No results for input(s): "VITAMINB12", "FOLATE", "FERRITIN", "TIBC", "IRON", "RETICCTPCT" in the last 72 hours.   Radiology: MR Cervical Spine Wo Contrast Result Date: 05/10/2023 CLINICAL DATA:  Ataxia, cervical trauma arm weakness, eval central cord EXAM: MRI CERVICAL SPINE WITHOUT CONTRAST TECHNIQUE: Multiplanar, multisequence MR imaging of the cervical spine was performed. No intravenous contrast was administered. COMPARISON:  None Available. FINDINGS: Alignment: No substantial sagittal subluxation. Vertebrae: No bone marrow edema to suggest acute fracture. Edema in the anterior C6-C7 disc space with extension anteriorly and destruction of the anterior longitudinal ligament (see series 3, image 8). Adjacent prevertebral edema which extends inferiorly into the upper thoracic spine. Cord: T2 hyperintensity within the cord at C3-C4. Posterior Fossa, vertebral arteries, paraspinal tissues: The visualized 2 artery flow voids are maintained. Small volume of prevertebral edema in the lower cervical spine extending into the upper thoracic spine. Disc levels: C2-C3: Small posterior disc osteophyte  complex. Mild canal stenosis. Bilateral facet uncovertebral hypertrophy with mild left foraminal stenosis. C3-C4: Posterior disc osteophyte complex and ligamentum flavum thickening. Resulting severe canal stenosis with cord compression. Bilateral facet uncovertebral hypertrophy. Resulting severe bilateral foraminal stenosis. C4-C5: Posterior disc osteophyte complex and ligamentum flavum thickening. Resulting moderate to severe canal stenosis. Bilateral facet and vertebral hypertrophy with severe bilateral foraminal stenosis. C5-C6: Segmentation anomaly. Partial fusion across the disc. Bilateral facet uncovertebral hypertrophy. Canal and foramina are patent. C6-C7: Posterior disc osteophyte complex with central disc protrusion. Bilateral facet and uncovertebral hypertrophy. Moderate bilateral foraminal stenosis. Mild to moderate canal stenosis. C7-T1: Uncovertebral hypertrophy without significant canal or foraminal stenosis. IMPRESSION: 1. 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 Edema extends posteriorly to involve the C6-C7 disc, compatible with traumatic disc injury. Small volume of adjacent prevertebral edema. 2. At C3-C4, severe canal stenosis with cord compression. T2 hyperintensity in the cord at this level is compatible with cord edema and/or myelomalacia. Severe bilateral foraminal stenosis at this level. 3. At C4-C5, moderate to severe canal stenosis and severe bilateral foraminal stenosis. 4. At C6-C7, mild to moderate canal stenosis and moderate bilateral foraminal stenosis. Electronically Signed   By: Stevenson Elbe M.D.   On: 05/10/2023 20:41   MR BRAIN WO CONTRAST Result Date: 05/10/2023 CLINICAL DATA:  Neuro deficit, acute, stroke suspected left sided weakness EXAM: MRI HEAD WITHOUT CONTRAST TECHNIQUE: Multiplanar, multiecho pulse sequences of the brain and surrounding structures were obtained without intravenous contrast. COMPARISON:  CT head and CT cervical spine from earlier  today. FINDINGS: Brain: No acute infarction, hemorrhage, hydrocephalus, extra-axial collection or mass lesion. Remote right PCA territory infarct. Vascular: Major arterial flow voids  are maintained at the skull base. Skull and upper cervical spine: Normal marrow signal. Sinuses/Orbits: Negative. IMPRESSION: 1. No evidence of acute intracranial abnormality. 2. Remote right PCA territory infarct. Electronically Signed   By: Stevenson Elbe M.D.   On: 05/10/2023 20:22   CT Thoracic Spine Wo Contrast Result Date: 05/10/2023 CLINICAL DATA:  Back trauma, fall from chair. EXAM: CT THORACIC AND LUMBAR SPINE WITHOUT CONTRAST TECHNIQUE: Multidetector CT imaging of the thoracic and lumbar spine was performed without contrast. Multiplanar CT image reconstructions were also generated. RADIATION DOSE REDUCTION: This exam was performed according to the departmental dose-optimization program which includes automated exposure control, adjustment of the mA and/or kV according to patient size and/or use of iterative reconstruction technique. COMPARISON:  10/08/2022, 04/28/2022 FINDINGS: CT THORACIC SPINE FINDINGS Alignment: Normal. Vertebrae: No acute fracture or focal pathologic process. Paraspinal and other soft tissues: No paraspinal fat stranding or epidural hematoma. Aortic atherosclerosis is noted. There are multi-vessel coronary artery calcifications. Pleural and parenchymal scarring is noted at the lung apices. There are trace bilateral pleural effusions with atelectasis. Disc levels: Multilevel degenerative endplate changes. There is calcification of the supraspinous ligament. CT LUMBAR SPINE FINDINGS Segmentation: 5 lumbar type vertebrae. Alignment: Normal. Vertebrae: There is a old fracture of the L1, L2, and L3 transverse processes on the left with bone callus formation. No acute fracture is seen. Paraspinal and other soft tissues: No acute abnormality. Aortic atherosclerosis is seen. Left renal calculi are noted.  Disc levels: Multi level degenerative endplate changes and facet arthropathy. There is a disc herniation at L4-L5 resulting in severe spinal canal stenosis and moderate to severe neural foraminal stenosis bilaterally, greater on the left than on the right. The spinal canal and neural foramina are otherwise patent. IMPRESSION: CT THORACIC SPINE IMPRESSION 1. Multilevel degenerative changes without evidence of acute fracture. 2. Trace bilateral pleural effusions with atelectasis. 3. Aortic atherosclerosis with coronary artery calcifications. CT LUMBAR SPINE IMPRESSION 1. 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 2. Multilevel degenerative changes in the lumbar spine as described above. 3. Left renal calculi. Electronically Signed   By: Wyvonnia Heimlich M.D.   On: 05/10/2023 20:01   CT Lumbar Spine Wo Contrast Result Date: 05/10/2023 CLINICAL DATA:  Back trauma, fall from chair. EXAM: CT THORACIC AND LUMBAR SPINE WITHOUT CONTRAST TECHNIQUE: Multidetector CT imaging of the thoracic and lumbar spine was performed without contrast. Multiplanar CT image reconstructions were also generated. RADIATION DOSE REDUCTION: This exam was performed according to the departmental dose-optimization program which includes automated exposure control, adjustment of the mA and/or kV according to patient size and/or use of iterative reconstruction technique. COMPARISON:  10/08/2022, 04/28/2022 FINDINGS: CT THORACIC SPINE FINDINGS Alignment: Normal. Vertebrae: No acute fracture or focal pathologic process. Paraspinal and other soft tissues: No paraspinal fat stranding or epidural hematoma. Aortic atherosclerosis is noted. There are multi-vessel coronary artery calcifications. Pleural and parenchymal scarring is noted at the lung apices. There are trace bilateral pleural effusions with atelectasis.  Disc levels: Multilevel degenerative endplate changes. There is calcification of the supraspinous ligament. CT LUMBAR SPINE FINDINGS Segmentation: 5 lumbar type vertebrae. Alignment: Normal. Vertebrae: There is a old fracture of the L1, L2, and L3 transverse processes on the left with bone callus formation. No acute fracture is seen. Paraspinal and other soft tissues: No acute abnormality. Aortic atherosclerosis is seen. Left renal calculi are noted. Disc levels: Multi level degenerative  endplate changes and facet arthropathy. There is a disc herniation at L4-L5 resulting in severe spinal canal stenosis and moderate to severe neural foraminal stenosis bilaterally, greater on the left than on the right. The spinal canal and neural foramina are otherwise patent. IMPRESSION: CT THORACIC SPINE IMPRESSION 1. Multilevel degenerative changes without evidence of acute fracture. 2. Trace bilateral pleural effusions with atelectasis. 3. Aortic atherosclerosis with coronary artery calcifications. CT LUMBAR SPINE IMPRESSION 1. 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 2. Multilevel degenerative changes in the lumbar spine as described above. 3. Left renal calculi. Electronically Signed   By: Wyvonnia Heimlich M.D.   On: 05/10/2023 20:01   CT Cervical Spine Wo Contrast Result Date: 05/10/2023 CLINICAL DATA:  Fall, weakness EXAM: CT CERVICAL SPINE WITHOUT CONTRAST TECHNIQUE: Multidetector CT imaging of the cervical spine was performed without intravenous contrast. Multiplanar CT image reconstructions were also generated. RADIATION DOSE REDUCTION: This exam was performed according to the departmental dose-optimization program which includes automated exposure control, adjustment of the mA and/or kV according to patient size and/or use of iterative reconstruction technique. COMPARISON:   None Available. FINDINGS: Alignment: No subluxation.  Facet alignment within normal limits. Skull base and vertebrae: No fracture Soft tissues and spinal canal: No prevertebral fluid or swelling. No visible canal hematoma. Disc levels: Prominent multilevel anterior osteophytes. Fusion of the C5-C6 vertebral bodies and posterior elements. Multilevel degenerative changes with osteophytes and mild disc space narrowing. Small central disc protrusion at C2-C3. At least mild canal stenosis. Posterior disc osteophyte complex at C3-C4 with moderate severe canal stenosis. Posterior disc osteophyte C4-C5 with at least moderate canal stenosis. Posterior disc osteophyte C6-C7 with at least mild canal stenosis and mild mass effect on the anterior thecal sac. Multilevel facet degenerative change. Multilevel foraminal narrowing. Upper chest: Apical scarring Other: None IMPRESSION: 1. Negative for acute fracture or subluxation of the cervical spine. 2. 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 Electronically Signed   By: Esmeralda Hedge M.D.   On: 05/10/2023 17:03   DG Chest Port 1 View Result Date: 05/10/2023 CLINICAL DATA:  Weakness EXAM: PORTABLE CHEST 1 VIEW COMPARISON:  12/21/2019 FINDINGS: Pleural-parenchymal scarring at the apices. No consolidation, pleural effusion or pneumothorax. Borderline to mild cardiomegaly. Aortic atherosclerosis. IMPRESSION: No active disease. Borderline to mild cardiomegaly. Electronically Signed   By: Esmeralda Hedge M.D.   On: 05/10/2023 16:55   CT Head Wo Contrast Result Date: 05/10/2023 CLINICAL DATA:  Head trauma weakness EXAM: CT HEAD WITHOUT CONTRAST TECHNIQUE: Contiguous axial images were obtained from the base of the skull through the vertex without intravenous contrast. RADIATION DOSE REDUCTION: This exam was performed according to the departmental dose-optimization program which includes automated exposure control, adjustment of the mA  and/or kV according to patient size and/or use of iterative reconstruction technique. COMPARISON:  CT brain 05/02/2019 FINDINGS: Brain: Mild motion degradation limits the exam. Interval small chronic appearing right occipital infarct. Mild atrophy. Patchy white matter hypodensity most likely chronic small vessel ischemic change. The ventricles are non enlarged. Vascular: No hyperdense vessels.  No unexpected calcification Skull: No definitive fracture but limited by motion Sinuses/Orbits: No acute finding. Other: None IMPRESSION: 1. Mild motion degradation limits the exam. No definite CT evidence for acute intracranial abnormality. 2. Atrophy and chronic small vessel ischemic changes of the white matter. Interval small chronic appearing right occipital infarct, new  compared to 2021 head CT. Electronically Signed   By: Esmeralda Hedge M.D.   On: 05/10/2023 16:53    ECHO: 05/14/2020  MODERATE LV SYSTOLIC DYSFUNCTION  MILD RV SYSTOLIC DYSFUNCTION  NO VALVULAR STENOSIS  MODERATE MR, TR  MILD AR, PR  EF 30%   TELEMETRY reviewed by me 05/11/2023: Sinus rhythm, rate 70s  EKG reviewed by me: Sinus rhythm with PACs, rate 67 bpm  Data reviewed by me 05/11/2023: last 24h vitals tele labs imaging I/O ED provider note, admission H&P, neurosurgery, critical care notes.  Principal Problem:   Spinal cord injury, cervical region [facility])    ASSESSMENT AND PLAN:   Billy Franco is a 83 y.o. male  with a past medical history of CAD s/p stent (2022), chronic systolic congestive heart failure (EF 25% - 30% - 04/2020), Ischemic Cardiomyopathy (refuses AICD), AF RVR (not on Eliquis  due to hematuria), T2DM, CKD, hypothyroidism who presented to the ED on 05/10/2023 for falling out of a chair on Friday. Patient is requiring surgical intervention and needs cardiac clearance. Cardiology was consulted for further evaluation.   # Fall, Fracture/disc herniation at C5/6 with moderate stenosis #Paroxysmal Atrial fibrillation #  Chronic systolic congestive heart failure (EF 25% - 30% - 04/2020)  # Ischemic Cardiomyopathy (refuses AICD) # CAD s/p stent placement Patient presents to the ED after falling from chair on Friday leading to a new fracture/disc herniation at C5/6 requiring surgical intervention. Patient reports feeling well and without chest pain or shortness of breath. Per Neurosurgical team plan for surgery today. Patient has history of atrial fibrillation, however has been in sinus rhyme since admission. Patient is not on anticoagulants due to history of hematuria. Patient appears euvolemic on exam. BP and HR are stable.  -Plan to hold Plavix  today and transfuse platelets for surgery.  -Continue Atorvastatin  10 mg daily. -Plan to resume home GDMT post-op as BP and renal function allow.  Case discussed with Dr Norrine Bedford neurosurgery. Patient is moderate cardiac risk for none cardiac surgery. No interventions indicated to modify his risk except to modify platelets function with plt transfusion pre-op.  Patient is at elevated moderate but acceptable risk for proceeding with surgery given cardiac history (reduced EF, coronary artery disease) understanding risk/benefits  Can proceed with cervical spine neurosurgery without further cardiac diagnostics needed.     Signed: Antonette Batters, MD  05/11/2023, 9:59 AM [facility]

## 2023-05-11 NOTE — Op Note (Signed)
 Indications: [name] is a 83 y.o. 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  Due to underlying cervical stenosis, myelopathy, myelomalacia in the setting of his incomplete spinal cord injury, surgical decompression was recommended.  Findings: stenosis, successful decompression  Preoperative Diagnosis: Central cord syndrome, incomplete spinal cord injury, cervical myelopathy, myelomalacia, cervical stenosis Postoperative Diagnosis: same   EBL: 50 ml IVF: see AR Drains: one Disposition: Extubated and Stable to PACU Complications: none  No foley catheter was placed.   Preoperative Note:    Risks of surgery discussed include: infection, bleeding, stroke, coma, death, paralysis, CSF leak, nerve/spinal cord injury, numbness, tingling, weakness, complex regional pain syndrome, recurrent stenosis and/or disc herniation, vascular injury, development of instability, neck/back pain, need for further surgery, persistent symptoms, development of deformity, and the risks of anesthesia. The patient understood these risks and agreed to proceed.   Procedure:  1) Anterior cervical diskectomy and fusion at C3/4 and C4/5 2) Anterior cervical instrumentation at C3 - 5 using Nuvasive ACP 3) Placement of structural allograft ([place]) 4) Use of operative microscope 5) Use of flouroscopy   Procedure: After obtaining informed consent, the patient taken to the operating room, placed in supine position, general anesthesia induced.  The patient had a small shoulder roll placed behind their shoulders.  The patient received preop antibiotics and IV Decadron .  A timeout was performed. The patient had a neck incision outlined, was prepped and draped in usual sterile fashion. The incision was injected with local anesthetic.   An incision was opened, dissection taken down medial to the carotid artery and jugular vein, lateral to the trachea and esophagus.  The  prevertebral fascia identified and a localizing x-ray demonstrated the correct level.  The longus colli were dissected laterally, and self-retaining retractors placed to open the operative field. The microscope was then brought into the field.  With this complete, distractor pins were placed in the vertebral bodies of C3 and C5. The distractor was placed, and the anuli at C3/4 and C4/5 were opened using a bovie.  Curettes and pituitary rongeurs used to remove the majority of disk, then the drill was used to remove the posterior osteophyte and begin the foraminotomies. The nerve hook was used to elevate the posterior longitudinal ligament, which was then removed with Kerrison rongeurs. The microblunt nerve hook could be passed out the foramen bilaterally at each level.   Meticulous hemostasis was obtained.  Structural allograft was tapped behind the anterior lip of the vertebral body at C3/4 (7 mm) and C4/5 (7 mm).    Please note that the procedure included removal of the disc, removal of the posterior osteophytes, and removal of the posterior longitudinal ligament to ensure decompression of the spinal cord.  Additionally, foraminotomies were performed on both sides of the spinal canal to decompress the nerve roots. This was performed at each level.  The caspar distractor was removed, and bone wax used for hemostasis. A separate, 38 mm 3 segment Nuvasive ACP plate was chosen.  Two screws placed in each vertebral body, respectively making sure the screws were behind the locking mechanism.  Final AP and lateral radiographs were taken.   Please note that the plate is not inclusive to the interbody structural allograft.  The anchoring mechanism of the plate is completely separate from the allograft.  With everything in good position, the wound was irrigated copiously and meticulous hemostasis obtained.  Wound was closed in 2 layers using interrupted inverted 3-0  Vicryl sutures.  The wound was dressed with  dermabond, the head of bed at 30 degrees, taken to recovery room in stable condition.  No new postop neurological deficits were identified.  Sponge and pattie counts were correct at the end of the procedure.   Monitoring was stable throughout.   I performed the entire procedure with the assistance of Anastacio Karvonen PA as an Designer, television/film set. An assistant was required for this procedure due to the complexity.  The assistant provided assistance in tissue manipulation and suction, and was required for the successful and safe performance of the procedure. I performed the critical portions of the procedure.   Jodeen Munch MD

## 2023-05-11 NOTE — Anesthesia Procedure Notes (Signed)
 Arterial Line Insertion Start/End4/21/2025 12:55 PM, 05/11/2023 1:05 PM Performed by: Enrique Harvest, MD, Morey Ar, RN  Patient location: OR. Preanesthetic checklist: patient identified, IV checked, site marked, risks and benefits discussed, surgical consent, monitors and equipment checked, pre-op evaluation, timeout performed and anesthesia consent Right, radial was placed Catheter size: 20 G Hand hygiene performed , maximum sterile barriers used  and Seldinger technique used  Attempts: 1 Procedure performed using ultrasound guided technique. Ultrasound Notes:anatomy identified, needle tip was noted to be adjacent to the nerve/plexus identified and no ultrasound evidence of intravascular and/or intraneural injection Following insertion, dressing applied. Post procedure assessment: normal and unchanged  Patient tolerated the procedure well with no immediate complications.

## 2023-05-11 NOTE — Interval H&P Note (Signed)
 History and Physical Interval Note:  05/11/2023 11:43 AM  [name]  has presented today for surgery, with the diagnosis of incomplete spinal cord injury, myelomalacia, cervical stenosis.  The various methods of treatment have been discussed with the patient and family. After consideration of risks, benefits and other options for treatment, the patient has consented to  Procedure(s): ANTERIOR CERVICAL DECOMPRESSION/DISCECTOMY FUSION 2 LEVELS (N/A) as a surgical intervention.  The patient's history has been reviewed, patient examined, no change in status, stable for surgery.  I have reviewed the patient's chart and labs.  Questions were answered to the patient's satisfaction.    Billy Franco

## 2023-05-11 NOTE — Anesthesia Procedure Notes (Addendum)
 Procedure Name: Intubation Date/Time: 05/11/2023 12:48 PM  Performed by: Morey Ar, RNPre-anesthesia Checklist: Patient identified, Emergency Drugs available, Suction available and Patient being monitored Patient Re-evaluated:Patient Re-evaluated prior to induction Oxygen Delivery Method: Circle system utilized Preoxygenation: Pre-oxygenation with 100% oxygen Induction Type: IV induction and Rapid sequence Laryngoscope Size: McGrath and 4 Grade View: Grade II Tube type: Oral Number of attempts: 1 Airway Equipment and Method: Stylet and Oral airway Placement Confirmation: ETT inserted through vocal cords under direct vision, positive ETCO2 and breath sounds checked- equal and bilateral Secured at: 23 cm Tube secured with: Tape Dental Injury: Teeth and Oropharynx as per pre-operative assessment  Comments: Atraumatic placement, c collar remained in placed during intubation, head and neck remained neutral, pt RSI.

## 2023-05-11 NOTE — Discharge Instructions (Signed)
 Your surgeon has performed an operation on your cervical spine (neck) to relieve pressure on the spinal cord and/or nerves. This involved making an incision in the front of your neck and removing one or more of the discs that support your spine. Next, a small piece of bone, a titanium plate, and screws were used to fuse two or more of the vertebrae (bones) together.  The following are instructions to help in your recovery once you have been discharged from the hospital. Even if you feel well, it is important that you follow these activity guidelines. If you do not let your neck heal properly from the surgery, you can increase the chance of return of your symptoms and other complications.  * Do not take anti-inflammatory medications for 3 months after surgery (naproxen [Aleve], ibuprofen [Advil, Motrin], etc.). These medications can prevent your bones from healing properly.  Celebrex, if prescribed, is ok to take.  Activity    No bending, lifting, or twisting ("BLT"). Avoid lifting objects heavier than 10 pounds (gallon milk jug).  Where possible, avoid household activities that involve lifting, bending, reaching, pushing, or pulling such as laundry, vacuuming, grocery shopping, and childcare. Try to arrange for help from friends and family for these activities while your back heals.  Increase physical activity slowly as tolerated.  Taking short walks is encouraged, but avoid strenuous exercise. Do not jog, run, bicycle, lift weights, or participate in any other exercises unless specifically allowed by your doctor.  Talk to your doctor before resuming sexual activity.  You should not drive until cleared by your doctor.  Until released by your doctor, you should not return to work or school.  You should rest at home and let your body heal.   You may shower three days after your surgery.  After showering, lightly dab your incision dry. Do not take a tub bath or go swimming until approved by your  doctor at your follow-up appointment.  If your doctor ordered a cervical collar (neck brace) for you, you should wear it whenever you are out of bed. You may remove it when lying down or sleeping, but you should wear it at all other times. Not all neck surgeries require a cervical collar.  If you smoke, we strongly recommend that you quit.  Smoking has been proven to interfere with normal bone healing and will dramatically reduce the success rate of your surgery. Please contact [facility] (800-QUIT-NOW) and use the resources at www.[facility].com for assistance in stopping smoking.  Surgical Incision   If you have a dressing on your incision, you may remove it two days after your surgery. Keep your incision area clean and dry.  If you have staples or stitches on your incision, you should have a follow up scheduled for removal. If you do not have staples or stitches, you will have steri-strips (small pieces of surgical tape) or Dermabond glue. The steri-strips/glue should begin to peel away within about a week (it is fine if the steri-strips fall off before then). If the strips are still in place one week after your surgery, you may gently remove them.  Diet           You may return to your usual diet. However, you may experience discomfort when swallowing in the first month after your surgery. This is normal. You may find that softer foods are more comfortable for you to swallow. Be sure to stay hydrated.  When to Contact us  You may experience pain in your  neck and/or pain between your shoulder blades. This is normal and should improve in the next few weeks with the help of pain medication, muscle relaxers, and rest. Some patients report that a warm compress on the back of the neck or between the shoulder blades helps.  However, should you experience any of the following, contact us immediately: New numbness or weakness Pain that is progressively getting worse, and is not relieved by your pain  medication, muscle relaxers, rest, and warm compresses Bleeding, redness, swelling, pain, or drainage from surgical incision Chills or flu-like symptoms Fever greater than 101.0 F (38.3 C) Inability to eat, drink fluids, or take medications Problems with bowel or bladder functions Difficulty breathing or shortness of breath Warmth, tenderness, or swelling in your calf Contact Information How to contact us:  If you have any questions/concerns before or after surgery, you can reach [place] at [phone], or you can send a mychart message. We can be reached by phone or mychart 8am-4pm, Monday-Friday.  *Please note: Calls after 4pm are forwarded to a third party answering service. Mychart messages are not routinely monitored during evenings, weekends, and holidays. Please call our office to contact the answering service for urgent concerns during non-business hours.

## 2023-05-11 NOTE — Anesthesia Preprocedure Evaluation (Signed)
 Anesthesia Evaluation  Patient identified by MRN, date of birth, ID band Patient awake    Reviewed: Allergy & Precautions, H&P , NPO status , Patient's Chart, lab work & pertinent test results, reviewed documented beta blocker date and time   History of Anesthesia Complications (+) history of anesthetic complications  Airway Mallampati: III  TM Distance: >3 FB Neck ROM: limited    Dental  (+) Teeth Intact   Pulmonary neg pulmonary ROS   Pulmonary exam normal        Cardiovascular Exercise Tolerance: Poor hypertension, On Medications + Peripheral Vascular Disease and +CHF  Normal cardiovascular exam Rhythm:regular Rate:Normal     Neuro/Psych  Neuromuscular disease  negative psych ROS   GI/Hepatic Neg liver ROS,GERD  Medicated,,  Endo/Other  diabetesHypothyroidism    Renal/GU Renal disease  negative genitourinary   Musculoskeletal   Abdominal   Peds  Hematology  (+) Blood dyscrasia, anemia   Anesthesia Other Findings Past Medical History: No date: Arthritis     Comment:  Lower back, hips No date: Atrial fibrillation (HCC) No date: CHF (congestive heart failure) (HCC) No date: CKD (chronic kidney disease), stage III (HCC) No date: Complication of anesthesia     Comment:  has "crooked airway"  Diff breathing after extubated               (after kidney stone procedure 07/2013) No date: Diabetes mellitus without complication (HCC) No date: GERD (gastroesophageal reflux disease) No date: Hypertension No date: Hypothyroidism No date: Nephrolithiasis Past Surgical History: 05/07/2021: CATARACT EXTRACTION W/PHACO; Right     Comment:  Procedure: CATARACT EXTRACTION PHACO AND INTRAOCULAR               LENS PLACEMENT (IOC) RIGHT DIABETIC 8.63 00:56.5;                Surgeon: Clair Crews, MD;  Location: [facility];  Service: Ophthalmology;  Laterality: Right;                 Diabetic 05/21/2021: CATARACT EXTRACTION W/PHACO; Left     Comment:  Procedure: CATARACT EXTRACTION PHACO AND INTRAOCULAR               LENS PLACEMENT (IOC) LEFT DIABETIC 7.27 01:01.5;                Surgeon: Clair Crews, MD;  Location: [facility]              [facility];  Service: Ophthalmology;  Laterality: Left;                Diabetic 03/08/2020: CORONARY STENT INTERVENTION; N/A     Comment:  Procedure: CORONARY STENT INTERVENTION;  Surgeon:               Antonette Batters, MD;  Location: [facility];               Service: Cardiovascular;  Laterality: N/A; 03/08/2020: LEFT HEART CATH AND CORONARY ANGIOGRAPHY; Left     Comment:  Procedure: LEFT HEART CATH AND CORONARY ANGIOGRAPHY;                Surgeon: Antonette Batters, MD;  Location: [facility]              CV LAB;  Service: Cardiovascular;  Laterality: Left; 1994: NASAL FRACTURE SURGERY 2015: URETEROLITHOTOMY; Right BMI    Body Mass Index: 23.65 kg/m  Reproductive/Obstetrics negative OB ROS                             Anesthesia Physical Anesthesia Plan  ASA: 3 and emergent  Anesthesia Plan: General ETT   Post-op Pain Management:    Induction:   PONV Risk Score and Plan: 3  Airway Management Planned:   Additional Equipment:   Intra-op Plan:   Post-operative Plan:   Informed Consent: I have reviewed the patients History and Physical, chart, labs and discussed the procedure including the risks, benefits and alternatives for the proposed anesthesia with the patient or authorized representative who has indicated his/her understanding and acceptance.     Dental Advisory Given  Plan Discussed with: CRNA  Anesthesia Plan Comments:        Anesthesia Quick Evaluation

## 2023-05-11 NOTE — Consult Note (Addendum)
 Consult requested by:  Dr. Jaclynn Mast    Consult requested for:  Spinal cord injury  Primary Physician:  Sowles, Krichna, MD  History of Present Illness: 05/11/2023 [name] is here today with a chief complaint of weakness.  He has had a couple of falls, but began having worsened upper extremity weakness (L>R) since his last fall on Friday.  He has had difficulty grasping items and using his hands.  He reports feeling off balance.    He was brought to the hospital where a spinal cord injury was diagnosed.  He was admitted to the ICU with blood pressure support.  The symptoms are causing a significant impact on the patient's life.   I have utilized the care everywhere function in epic to review the outside records available from external health systems.  Review of Systems:  A 10 point review of systems is negative, except for the pertinent positives and negatives detailed in the HPI.  Past Medical History: Past Medical History:  Diagnosis Date   Arthritis    Lower back, hips   Atrial fibrillation (HCC)    CHF (congestive heart failure) (HCC)    CKD (chronic kidney disease), stage III (HCC)    Complication of anesthesia    has "crooked airway"  Diff breathing after extubated (after kidney stone procedure 07/2013)   Diabetes mellitus without complication (HCC)    GERD (gastroesophageal reflux disease)    Hypertension    Hypothyroidism    Nephrolithiasis     Past Surgical History: Past Surgical History:  Procedure Laterality Date   CATARACT EXTRACTION W/PHACO Right 05/07/2021   Procedure: CATARACT EXTRACTION PHACO AND INTRAOCULAR LENS PLACEMENT (IOC) RIGHT DIABETIC 8.63 00:56.5;  Surgeon: Clair Crews, MD;  Location: [facility];  Service: Ophthalmology;  Laterality: Right;  Diabetic   CATARACT EXTRACTION W/PHACO Left 05/21/2021   Procedure: CATARACT EXTRACTION PHACO AND INTRAOCULAR LENS PLACEMENT (IOC) LEFT DIABETIC 7.27 01:01.5;  Surgeon: Clair Crews, MD;  Location: [facility];  Service: Ophthalmology;  Laterality: Left;  Diabetic   CORONARY STENT INTERVENTION N/A 03/08/2020   Procedure: CORONARY STENT INTERVENTION;  Surgeon: Antonette Batters, MD;  Location: [facility] IN[facility];  Service: Cardiovascular;  Laterality: N/A;   LEFT HEART CATH AND CORONARY ANGIOGRAPHY Left 03/08/2020   Procedure: LEFT HEART CATH AND CORONARY ANGIOGRAPHY;  Surgeon: Antonette Batters, MD;  Location: [facility] CV [facility];  Service: Cardiovascular;  Laterality: Left;   NASAL FRACTURE SURGERY  1994   URETEROLITHOTOMY Right 2015    Allergies: Allergies as of 05/10/2023 - Review Complete 05/10/2023  Allergen Reaction Noted   Codeine Rash and Other (See Comments) 07/31/2014   Lovastatin Rash and Other (See Comments) 07/31/2014   Tape Rash 05/01/2021    Medications: Current Meds  Medication Sig   acetaminophen  (TYLENOL ) 650 MG CR tablet Take 650 mg by mouth every 8 (eight) hours as needed for pain.   aluminum hydroxide-magnesium  carbonate (GAVISCON) 95-358 MG/15ML SUSP Take by mouth.   atorvastatin  (LIPITOR) 10 MG tablet Take 1 tablet (10 mg total) by mouth daily at 6 PM.   carvedilol  (COREG ) 3.125 MG tablet Take 1 tablet (3.125 mg total) by mouth 2 (two) times daily with a meal.   Cholecalciferol (VITAMIN D -1000 MAX ST) 25 MCG (1000 UT) tablet Take 1 tablet by mouth daily.   clopidogrel  (PLAVIX ) 75 MG tablet Take 75 mg by mouth daily.   Cyanocobalamin (B-12) 1000 MCG TBCR Take 1,000 mcg by mouth every other day.  docusate sodium  (COLACE) 50 MG capsule Take 100 mg by mouth 2 (two) times daily as needed for mild constipation.   fexofenadine-pseudoephedrine (ALLEGRA-D 24) 180-240 MG 24 hr tablet Take 1 tablet by mouth daily as needed (allergies).   furosemide  (LASIX ) 40 MG tablet Take 40 mg by mouth daily.   glipiZIDE  2.5 MG TABS Take 1 tablet by mouth daily with breakfast.   levothyroxine  (SYNTHROID ) 25 MCG tablet TAKE 1 TABLET (25 MCG TOTAL)  BY MOUTH DAILY BEFORE BREAKFAST. ON SUNDAYS, TAKE 2 TABLETS BY MOUTH BEFORE BREAKFAST.   losartan  (COZAAR ) 25 MG tablet Take 1 tablet (25 mg total) by mouth at bedtime.   magnesium  oxide (MAG-OX) 400 MG tablet Take 400 mg by mouth daily.   metaxalone (SKELAXIN) 800 MG tablet Take 400 mg by mouth 2 (two) times daily.   metFORMIN  (GLUCOPHAGE -XR) 750 MG 24 hr tablet Take 2 tablets (1,500 mg total) by mouth daily with breakfast.   Nutritional Supplements (THERALITH XR PO) Take by mouth QID. 3.752-45-45-49.5mg . Take 2 in the evening and 2 at bedtime.   pantoprazole  (PROTONIX ) 40 MG tablet Take 1 tablet (40 mg total) by mouth daily.   polyethylene glycol powder (GLYCOLAX /MIRALAX ) 17 GM/SCOOP powder Take 17 g by mouth daily.   rOPINIRole  (REQUIP ) 1 MG tablet Take 1 tablet (1 mg total) by mouth 2 (two) times daily.   spironolactone  (ALDACTONE ) 25 MG tablet Take 12.5 mg by mouth daily.   tamsulosin  (FLOMAX ) 0.4 MG CAPS capsule Take 1 capsule (0.4 mg total) by mouth every evening.   triamcinolone  (KENALOG ) 0.1 % Apply 1 application topically 2 (two) times daily as needed (rash).   [DISCONTINUED] losartan  (COZAAR ) 50 MG tablet Take 50 mg by mouth daily.    Social History: Social History   Tobacco Use   Smoking status: Never   Smokeless tobacco: Never  Vaping Use   Vaping status: Never Used  Substance Use Topics   Alcohol use: No    Alcohol/week: 0.0 standard drinks of alcohol   Drug use: No    Family Medical History: Family History  Problem Relation Age of Onset   Heart disease Father     Physical Examination: Vitals:   05/11/23 0800 05/11/23 0815  BP: 103/79 124/79  Pulse: 63 64  Resp: (!) 22 14  Temp:    SpO2: 99% 99%   Heart sounds normal no MRG. Chest Clear to Auscultation Bilaterally.  General: Patient is in no apparent distress. Attention to examination is appropriate.  Neck:   Supple.  Full range of motion.  Respiratory: Patient is breathing without any  difficulty.   NEUROLOGICAL:     Awake, alert, oriented to person, place, and time.  Speech is clear and fluent.  Cranial Nerves: Pupils equal round and reactive to light.  Facial tone is symmetric.  Facial sensation is symmetric. Shoulder shrug is symmetric. Tongue protrusion is midline.  There is no pronator drift.  Strength: Side Biceps Triceps Deltoid Interossei Grip Wrist Ext. Wrist Flex.  R 5 5 5  4- 4- 4 4  L 4 4- 4- 3 2 2 2    Side Iliopsoas Quads Hamstring PF DF EHL  R 5 5 5 5 5 5   L 4+ 5 5 5 5 5    Reflexes are 3+ and symmetric at the biceps, triceps, brachioradialis, patella and achilles.   Hoffman's is present.   Bilateral upper and lower extremity sensation is intact to light touch with paresthesias in both hands.    No evidence of dysmetria noted.  Gait is untested.     Medical Decision Making  Imaging: MRI C spine 05/10/2023 IMPRESSION: 1. 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 Edema extends posteriorly to involve the C6-C7 disc, compatible with traumatic disc injury. Small volume of adjacent prevertebral edema. 2. At C3-C4, severe canal stenosis with cord compression. T2 hyperintensity in the cord at this level is compatible with cord edema and/or myelomalacia. Severe bilateral foraminal stenosis at this level. 3. At C4-C5, moderate to severe canal stenosis and severe bilateral foraminal stenosis. 4. At C6-C7, mild to moderate canal stenosis and moderate bilateral foraminal stenosis.     Electronically Signed   By: Davene Ernst.  I have personally reviewed the images and agree with the above interpretation.  Assessment and Plan: Mr. Mcdermid is a pleasant 83 y.o. 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  There is no evidence of malalignment at C6/7. He does not have clear instability at that  level.  Due to ongoing compression at C3/4 and C4/5, I have recommended surgical decompression with C3-5 anterior cervical discectomy and fusion.  He will have 1 unit of platelets transfused and one given during surgery.  There is no role for conservative management given his spinal cord injury.  I discussed the planned procedure at length with the patient, including the risks, benefits, alternatives, and indications. The risks discussed include but are not limited to bleeding, infection, need for reoperation, spinal fluid leak, stroke, vision loss, anesthetic complication, coma, paralysis, and even death. We also discussed the possibility of post-operative dysphagia, vocal cord paralysis, and the risk of adjacent segment disease in the future. I also described in detail that improvement was not guaranteed.  The patient expressed understanding of these risks, and asked that we proceed with surgery. I described the surgery in layman's terms, and gave ample opportunity for questions, which were answered to the best of my ability.   He will remain in collar until follow up demonstrates healing from his C6/7 injury.  We will continue BP support until later this week.  Exact timing will be determined secondary to when his injury occurred.  I have discussed with Dr. Beau Bound and Dr. Aleskerov and team.    Melrose Squire. Mont Antis MD, [facility] Neurosurgery

## 2023-05-11 NOTE — Progress Notes (Signed)
 PHARMACY CONSULT NOTE - FOLLOW UP  Pharmacy Consult for Electrolyte Monitoring and Replacement   Recent Labs: Potassium (mmol/L)  Date Value  05/11/2023 3.8  08/17/2013 4.1   Magnesium  (mg/dL)  Date Value  16/10/9602 1.9  08/15/2013 1.8   Calcium  (mg/dL)  Date Value  54/09/8117 9.4   Calcium , Total (mg/dL)  Date Value  14/78/2956 8.7   Albumin (g/dL)  Date Value  21/30/8657 4.0  10/30/2014 4.4   Phosphorus (mg/dL)  Date Value  84/69/6295 3.2   Sodium (mmol/L)  Date Value  05/11/2023 138  10/30/2014 140  08/17/2013 138     Assessment: 83 y.o. male  with a past medical history of CAD s/p stent, chronic systolic congestive heart failure (EF 25% - 30% - 04/2020), Ischemic Cardiomyopathy (refuses AICD), AF RVR (not on Eliquis  due to hematuria), T2DM, CKD, hypothyroidism who presented to the ED on 05/10/2023 for falling out of a chair on Friday.   Goal of Therapy:  Electrolytes WNL  Plan:  ---no electrolyte replacement warranted for today ---recheck electrolytes in am  Billy Franco ,PharmD Clinical Pharmacist 05/11/2023 7:03 AM

## 2023-05-11 NOTE — Progress Notes (Signed)
 Patient arrived to ICU-12 with his socks, pants, and t-shirt and dress shirt. T-shirt had to be cut off of the patient due to cervical collar in place. All other belongings placed in personal belongings bag and the patients wife, [name], carried them home with her. Patient visitor information given and password with designated contacts obtained.

## 2023-05-11 NOTE — Progress Notes (Signed)
 NAME:  Billy Franco, MRN:  [id number], DOB:  20-May-1940, LOS: 1 ADMISSION DATE:  05/10/2023, CONSULTATION DATE:  05/10/23 REFERRING MD:  Claria Crofts CHIEF COMPLAINT:  traumatic fall    HPI  83 y.o male with significant PMH of  T2DM, CKD stage III, HTN, HLD, HFrEF with EF 25-30%, refused AICD, ischemic Cardiomyopathy, A-Fib  not on anticoagulation due to gross hematuria, CAD s/p PCI and stent, GERD, RLS and chronic back pain who presented to the ED with chief complaints of bilateral upper and lower extremities weakness and numbness s/p fall.  Per family at the bedside, patient fell out of a folding chair on Friday and landed on his left side. Following the fall, he reported generalized weakness, numbness and difficulties grasping objects with his hands. He reported hitting his head without LOC.   ED Course: Initial vital signs showed HR of  86 beats/minute, BP 103/88 mm Hg, the RR 16 breaths/minute, and the oxygen saturation 100% on RA and a temperature of 97.91F (36.4C).  Pertinent Labs/Diagnostics Findings: Na+/ K+:137/4.0  Glucose:103 BUN/Cr.:37/1.41 WBC: 6.4 K/L Hgb/Hct:10.6/32.5 CXR> CTH> CT Cervical, thoracic, lumbar and MRI brain, cervical as reported below with significant findings of new fracture/disc herniation at C5/6 with moderate stenosis. Findings discussed with on call Neurosurgeon who recommended admission to ICU with frequent neurochecks, pressor to keep MAP>85 and possible surgical intervention. PCCM consulted for admission.  05/11/23- patient s/p surgery today.  Lovenox  to initiate in am per nsgy. I met with patient and family at bedside. He appears stable and is in good spirits   Past Medical History  T2DM, CKD stage III, HTN, HLD, HFrEF with EF 25-30%, refused AICD, ischemic Cardiomyopathy, A-Fib  not on anticoagulation due to gross hematuria, CAD s/p PCI and stent, GERD, RLS and chronic back pain   Significant Hospital Events   4/20:admit with cervical spine injury  following a traumatic fall  Consults:  Neurosurgery  Procedures:    Significant Diagnostic Tests:  04/20 Chest Xray> IMPRESSION: No active disease. Borderline to mild cardiomegaly.  04/20 CT Cervical Spine> IMPRESSION: 1. Negative for acute fracture or subluxation of the cervical spine. 2. 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  04/20 CT THORACIC SPINE IMPRESSION IMPRESSION: 1. Multilevel degenerative changes without evidence of acute fracture. 2. Trace bilateral pleural effusions with atelectasis. 3. Aortic atherosclerosis with coronary artery calcifications.   04/20 CT LUMBAR SPINE IMPRESSION 1. 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 2. Multilevel degenerative changes in the lumbar spine as described above. 3. Left renal calculi.  04/20 Noncontrast CT head> IMPRESSION: 1. Mild motion degradation limits the exam. No definite CT evidence for acute intracranial abnormality. 2. Atrophy and chronic small vessel ischemic changes of the white matter. Interval small chronic appearing right occipital infarct, new compared to 2021 head CT.  4/20 MRI Brain> IMPRESSION: 1. No evidence of acute intracranial abnormality. 2. Remote right PCA territory infarct.  4/20 MRI Cervical Spine> IMPRESSION: 1. 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 Edema extends posteriorly to involve the C6-C7 disc, compatible with traumatic disc injury. Small volume of adjacent prevertebral edema. 2. At C3-C4, severe canal stenosis with cord compression. T2 hyperintensity in the cord at this level is compatible with cord edema and/or myelomalacia. Severe bilateral foraminal stenosis at this level. 3. At C4-C5, moderate to severe canal stenosis and severe bilateral foraminal  stenosis. 4. At C6-C7, mild to moderate canal stenosis and moderate bilateral foraminal stenosis.  Interim History / Subjective:      Micro Data:  4/20: MRSA PCR>>   Antimicrobials:  None  OBJECTIVE  Blood pressure 124/79, pulse 64, temperature 97.9 F (36.6 C), temperature source Oral, resp. rate 14, height 5\' 7"  (1.702 m), weight 68.5 kg, SpO2 99%.        Intake/Output Summary (Last 24 hours) at 05/11/2023 0841 Last data filed at 05/11/2023 0700 Gross per 24 hour  Intake 814.16 ml  Output 600 ml  Net 214.16 ml   Filed Weights   05/10/23 1542 05/10/23 2245  Weight: 66.7 kg 68.5 kg     Physical Examination  GENERAL:83  year-old critically ill patient lying in the bed in no acute distress EYES: PEERLA. No scleral icterus. Extraocular muscles intact.  HEENT: Head traumatic,head laceration. normocephalic. Oropharynx and nasopharynx clear.  NECK:  No JVD, supple  LUNGS: Normal breath sounds bilaterally.  No use of accessory muscles of respiration.  CARDIOVASCULAR: S1, S2 normal. No murmurs, rubs, or gallops.  ABDOMEN: Soft, NTND EXTREMITIES: No swelling or erythema. Grip 4/5 bilaterally. Pulses palpable distally. NEUROLOGIC: The patient is A&O X 4. No focal neurological deficit appreciated. Cranial nerves are intact.  SKIN: No obvious rash, lesion, or ulcer. Warm to touch Labs/imaging that I havepersonally reviewed  (right click and "Reselect all SmartList Selections" daily)     Labs   CBC: Recent Labs  Lab 05/10/23 1621 05/11/23 0300  WBC 6.4 9.2  NEUTROABS 4.5  --   HGB 10.6* 12.0*  HCT 32.5* 36.3*  MCV 98.5 96.3  PLT 210 262    Basic Metabolic Panel: Recent Labs  Lab 05/10/23 1621 05/11/23 0300  NA 137 138  K 4.0 3.8  CL 102 103  CO2 25 23  GLUCOSE 103* 84  BUN 37* 32*  CREATININE 1.41* 1.40*  CALCIUM  9.8 9.4  MG  --  1.9  PHOS  --  3.2   GFR: Estimated Creatinine Clearance: 38 mL/min (A) (by C-G formula based on SCr of 1.4 mg/dL  (H)). Recent Labs  Lab 05/10/23 1621 05/11/23 0300  WBC 6.4 9.2    Liver Function Tests: Recent Labs  Lab 05/10/23 1621  AST 30  ALT 22  ALKPHOS 57  BILITOT 1.0  PROT 7.1  ALBUMIN 4.0   No results for input(s): "LIPASE", "AMYLASE" in the last 168 hours. No results for input(s): "AMMONIA" in the last 168 hours.  ABG    Component Value Date/Time   TCO2 26 04/15/2008 2244     Coagulation Profile: Recent Labs  Lab 05/10/23 2309  INR 1.0    Cardiac Enzymes: Recent Labs  Lab 05/10/23 1621  CKTOTAL 292    HbA1C: Hemoglobin A1C  Date/Time Value Ref Range Status  02/02/2023 08:50 AM 7.8 (A) 4.0 - 5.6 % Final  09/15/2022 08:38 AM 7.3 (A) 4.0 - 5.6 % Final   HbA1c, POC (controlled diabetic range)  Date/Time Value Ref Range Status  01/04/2018 09:16 AM 6.4 0.0 - 7.0 % Final   Hgb A1c MFr Bld  Date/Time Value Ref Range Status  05/06/2021 11:21 AM 6.9 (H) <5.7 % of total Hgb Final    Comment:    For someone without known diabetes, a hemoglobin A1c value of 6.5% or greater indicates that they may have  diabetes and this should be confirmed with a follow-up  test. . For someone with known diabetes, a value <7% indicates  that  their diabetes is well controlled and a value  greater than or equal to 7% indicates suboptimal  control. A1c targets should be individualized based on  duration of diabetes, age, comorbid conditions, and  other considerations. . Currently, no consensus exists regarding use of hemoglobin A1c for diagnosis of diabetes for children. Aaron Aas   09/10/2020 09:25 AM 6.4 (H) <5.7 % of total Hgb Final    Comment:    For someone without known diabetes, a hemoglobin  A1c value between 5.7% and 6.4% is consistent with prediabetes and should be confirmed with a  follow-up test. . For someone with known diabetes, a value <7% indicates that their diabetes is well controlled. A1c targets should be individualized based on duration of diabetes, age,  comorbid conditions, and other considerations. . This assay result is consistent with an increased risk of diabetes. . Currently, no consensus exists regarding use of hemoglobin A1c for diagnosis of diabetes for children. .     CBG: Recent Labs  Lab 05/10/23 2239 05/11/23 0320 05/11/23 0708  GLUCAP 77 73 109*    Review of Systems:    10 point ROS conducted and is negative except as per HPI and subjective findings   Past Medical History  He,  has a past medical history of Arthritis, Atrial fibrillation (HCC), CHF (congestive heart failure) (HCC), CKD (chronic kidney disease), stage III (HCC), Complication of anesthesia, Diabetes mellitus without complication (HCC), GERD (gastroesophageal reflux disease), Hypertension, Hypothyroidism, and Nephrolithiasis.   Surgical History    Past Surgical History:  Procedure Laterality Date   CATARACT EXTRACTION W/PHACO Right 05/07/2021   Procedure: CATARACT EXTRACTION PHACO AND INTRAOCULAR LENS PLACEMENT (IOC) RIGHT DIABETIC 8.63 00:56.5;  Surgeon: Clair Crews, MD;  Location: [facility];  Service: Ophthalmology;  Laterality: Right;  Diabetic   CATARACT EXTRACTION W/PHACO Left 05/21/2021   Procedure: CATARACT EXTRACTION PHACO AND INTRAOCULAR LENS PLACEMENT (IOC) LEFT DIABETIC 7.27 01:01.5;  Surgeon: Clair Crews, MD;  Location: [facility];  Service: Ophthalmology;  Laterality: Left;  Diabetic   CORONARY STENT INTERVENTION N/A 03/08/2020   Procedure: CORONARY STENT INTERVENTION;  Surgeon: Antonette Batters, MD;  Location: [facility];  Service: Cardiovascular;  Laterality: N/A;   LEFT HEART CATH AND CORONARY ANGIOGRAPHY Left 03/08/2020   Procedure: LEFT HEART CATH AND CORONARY ANGIOGRAPHY;  Surgeon: Antonette Batters, MD;  Location: [facility];  Service: Cardiovascular;  Laterality: Left;   NASAL FRACTURE SURGERY  1994   URETEROLITHOTOMY Right 2015     Social History   reports that he has never  smoked. He has never used smokeless tobacco. He reports that he does not drink alcohol and does not use drugs.   Family History   His family history includes Heart disease in his father.   Allergies Allergies  Allergen Reactions   Codeine Rash and Other (See Comments)    Per pt "hard on my kidneys"     Lovastatin Rash and Other (See Comments)   Tape Rash     Home Medications  Prior to Admission medications   Medication Sig Start Date End Date Taking? Authorizing Provider  acetaminophen  (TYLENOL ) 650 MG CR tablet Take 650 mg by mouth every 8 (eight) hours as needed for pain.   Yes [provider]  atorvastatin  (LIPITOR) 10 MG tablet Take 1 tablet (10 mg total) by mouth daily at 6 PM. 02/02/23  Yes Sowles, Krichna, MD  carvedilol  (COREG ) 3.125 MG tablet Take 1 tablet (3.125 mg total) by mouth 2 (  two) times daily with a meal. 12/25/19 05/10/23 Yes Chatterjee, Srobona Tublu, MD  clopidogrel  (PLAVIX ) 75 MG tablet Take 75 mg by mouth daily. 03/04/23  Yes [provider]  furosemide  (LASIX ) 40 MG tablet Take 40 mg by mouth daily. 05/03/23  Yes [provider]  glipiZIDE  2.5 MG TABS Take 1 tablet by mouth daily with breakfast. 02/02/23  Yes Sowles, Krichna, MD  levothyroxine  (SYNTHROID ) 25 MCG tablet TAKE 1 TABLET (25 MCG TOTAL) BY MOUTH DAILY BEFORE BREAKFAST. ON SUNDAYS, TAKE 2 TABLETS BY MOUTH BEFORE BREAKFAST. 04/27/23  Yes Sowles, Krichna, MD  losartan  (COZAAR ) 50 MG tablet Take 50 mg by mouth daily. 05/03/23  Yes [provider]  magnesium  oxide (MAG-OX) 400 MG tablet Take 400 mg by mouth daily.   Yes [provider]  metaxalone (SKELAXIN) 800 MG tablet May take 1/2-1 whole tablet up to 2 times daily as needed for pain/spasm. 04/14/22  Yes [provider]  metFORMIN  (GLUCOPHAGE -XR) 750 MG 24 hr tablet Take 2 tablets (1,500 mg total) by mouth daily with breakfast. 02/02/23  Yes Sowles, Krichna, MD  pantoprazole  (PROTONIX ) 40 MG tablet Take 1 tablet  (40 mg total) by mouth daily. 11/10/22 11/05/23 Yes Brigitte Canard, PA-C  rOPINIRole  (REQUIP ) 1 MG tablet Take 1 tablet (1 mg total) by mouth 2 (two) times daily. 02/02/23  Yes Sowles, Krichna, MD  spironolactone (ALDACTONE) 25 MG tablet Take 12.5 mg by mouth daily.   Yes Callwood, Dwayne D, MD  triamcinolone  (KENALOG ) 0.1 % Apply 1 application topically 2 (two) times daily as needed (rash).   Yes [provider]  aluminum hydroxide-magnesium  carbonate (GAVISCON) 95-358 MG/15ML SUSP Take by mouth.    [provider]  blood glucose meter kit and supplies 1 each by Other route as directed. Dispense based on patient and insurance preference. Use up to four times daily as directed. (FOR ICD-10 E10.9, E11.9). 09/09/21   Sowles, Krichna, MD  Cholecalciferol (VITAMIN D -1000 MAX ST) 25 MCG (1000 UT) tablet Take 1 tablet by mouth daily.    [provider]  Cyanocobalamin (B-12) 1000 MCG TBCR Take 1,000 mcg by mouth every other day. 01/02/20   [provider]  dapagliflozin  propanediol (FARXIGA ) 10 MG TABS tablet Take by mouth. 01/10/22   [provider]  docusate sodium  (COLACE) 50 MG capsule Take 100 mg by mouth 2 (two) times daily as needed for mild constipation.    [provider]  fexofenadine-pseudoephedrine (ALLEGRA-D 24) 180-240 MG 24 hr tablet Take 1 tablet by mouth daily as needed (allergies).    [provider]  furosemide  (LASIX ) 20 MG tablet Take 40 mg by mouth daily. Patient not taking: Reported on 05/10/2023 01/02/20   Callwood, Creig Doe D, MD  glucose blood (ONETOUCH ULTRA) test strip USE AS DIRECTED TO CHECK BLOOD GLUCOSE ONCE DAILY. 05/12/22   Sowles, Krichna, MD  Lancets (ONETOUCH DELICA PLUS [place]) MISC CHECK BLOOD SUGAR AT NOON 03/02/23   Sowles, Krichna, MD  losartan  (COZAAR ) 25 MG tablet Take 1 tablet (25 mg total) by mouth at bedtime. Patient not taking: Reported on 05/10/2023 12/25/19   Chatterjee, Srobona Tublu, MD  Nutritional  Supplements (THERALITH XR PO) Take by mouth QID. 3.752-45-45-49.5mg . Take 2 in the evening and 2 at bedtime.    [provider]  polyethylene glycol powder (GLYCOLAX /MIRALAX ) 17 GM/SCOOP powder Take 17 g by mouth daily. 11/10/22   Brigitte Canard, PA-C  tamsulosin  (FLOMAX ) 0.4 MG CAPS capsule Take 1 capsule (0.4 mg total) by mouth every evening.  09/08/22   Geraline Knapp, MD      Active Hospital Problem list   See systems below  Assessment & Plan:  #Cervical Spine Injury in the setting of traumatic fall MRI Cervical spine shows chronic compression and stenosis at C3/4 and a new fracture/disc herniation Ligamentous injury with edema at C5/6 with moderate stenosis  -maintain C-Spine precaution -Start pressors for MAP augmentation goal >85 mm- currently on neo -Frequent neuro checks -PRN Pain management -Keep NPO -Hold anticoagulation -Neurosurgery consult- s/p surgery   #Chronic HFrEF/Ischemic cardiomyopathy:  EF 40-45%  #Chronic A-Fib not on anticoagulation due to gross hematuria Hx:CAD s/p PCI and stent, HTN, HLD No vascular congestion on CXR yesterday. Appears euvolemic -Hold ? blockers /GDMT -will hold off on acei/arb/arni at this time to augment BP  -Continue Atorvastatin  once able to take po  #AKI on CKD Stage III -Monitor I&O's / urinary output -Follow BMP -Ensure adequate renal perfusion -Avoid nephrotoxic agents as able -Replace electrolytes as indicated   #Diabetes Mellitus -check hemoglobin A1c -CBGs q4 -Sliding scale insulin  -Follow ICU hyper/hypoglycemia protocol -Hold home Metformin  & Glipizide    #Hypothyroidism -Continue home synthroid   #RLS -Continue Requip   Best practice:  Diet:  NPO Pain/Anxiety/Delirium protocol (if indicated): No VAP protocol (if indicated): Not indicated DVT prophylaxis: Contraindicated GI prophylaxis: PPI Glucose control:  SSI Yes Central venous access:  N/A Arterial line:  N/A Foley:  N/A Mobility:  bed rest  PT  consulted: N/A Last date of multidisciplinary goals of care discussion [updated family at the bedside] Code Status:  full code Disposition: ICU   = Goals of Care = Code Status Order: full  Primary Emergency ContactRusell, Meneely, Home Phone: [phone] Patient and family wishes to pursue full aggressive treatment and intervention options, including CPR and intubation  Critical care provider statement:   Total critical care time: 33 minutes   Performed by: Jaclynn Mast MD   Critical care time was exclusive of separately billable procedures and treating other patients.   Critical care was necessary to treat or prevent imminent or life-threatening deterioration.   Critical care was time spent personally by me on the following activities: development of treatment plan with patient and/or surrogate as well as nursing, discussions with consultants, evaluation of patient's response to treatment, examination of patient, obtaining history from patient or surrogate, ordering and performing treatments and interventions, ordering and review of laboratory studies, ordering and review of radiographic studies, pulse oximetry and re-evaluation of patient's condition.    Montreal Steidle, M.D.  Pulmonary & Critical Care Medicine

## 2023-05-11 NOTE — H&P (View-Only) (Signed)
 Consult requested by:  Dr. Jaclynn Mast    Consult requested for:  Spinal cord injury  Primary Physician:  Sowles, Krichna, MD  History of Present Illness: 05/11/2023 [name] is here today with a chief complaint of weakness.  He has had a couple of falls, but began having worsened upper extremity weakness (L>R) since his last fall on Friday.  He has had difficulty grasping items and using his hands.  He reports feeling off balance.    He was brought to the hospital where a spinal cord injury was diagnosed.  He was admitted to the ICU with blood pressure support.  The symptoms are causing a significant impact on the patient's life.   I have utilized the care everywhere function in epic to review the outside records available from external health systems.  Review of Systems:  A 10 point review of systems is negative, except for the pertinent positives and negatives detailed in the HPI.  Past Medical History: Past Medical History:  Diagnosis Date   Arthritis    Lower back, hips   Atrial fibrillation (HCC)    CHF (congestive heart failure) (HCC)    CKD (chronic kidney disease), stage III (HCC)    Complication of anesthesia    has "crooked airway"  Diff breathing after extubated (after kidney stone procedure 07/2013)   Diabetes mellitus without complication (HCC)    GERD (gastroesophageal reflux disease)    Hypertension    Hypothyroidism    Nephrolithiasis     Past Surgical History: Past Surgical History:  Procedure Laterality Date   CATARACT EXTRACTION W/PHACO Right 05/07/2021   Procedure: CATARACT EXTRACTION PHACO AND INTRAOCULAR LENS PLACEMENT (IOC) RIGHT DIABETIC 8.63 00:56.5;  Surgeon: Clair Crews, MD;  Location: [facility];  Service: Ophthalmology;  Laterality: Right;  Diabetic   CATARACT EXTRACTION W/PHACO Left 05/21/2021   Procedure: CATARACT EXTRACTION PHACO AND INTRAOCULAR LENS PLACEMENT (IOC) LEFT DIABETIC 7.27 01:01.5;  Surgeon: Clair Crews, MD;  Location: [facility];  Service: Ophthalmology;  Laterality: Left;  Diabetic   CORONARY STENT INTERVENTION N/A 03/08/2020   Procedure: CORONARY STENT INTERVENTION;  Surgeon: Antonette Batters, MD;  Location: [facility] IN[facility];  Service: Cardiovascular;  Laterality: N/A;   LEFT HEART CATH AND CORONARY ANGIOGRAPHY Left 03/08/2020   Procedure: LEFT HEART CATH AND CORONARY ANGIOGRAPHY;  Surgeon: Antonette Batters, MD;  Location: [facility] INV[facility] CV [facility];  Service: Cardiovascular;  Laterality: Left;   NASAL FRACTURE SURGERY  1994   URETEROLITHOTOMY Right 2015    Allergies: Allergies as of 05/10/2023 - Review Complete 05/10/2023  Allergen Reaction Noted   Codeine Rash and Other (See Comments) 07/31/2014   Lovastatin Rash and Other (See Comments) 07/31/2014   Tape Rash 05/01/2021    Medications: Current Meds  Medication Sig   acetaminophen  (TYLENOL ) 650 MG CR tablet Take 650 mg by mouth every 8 (eight) hours as needed for pain.   aluminum hydroxide-magnesium  carbonate (GAVISCON) 95-358 MG/15ML SUSP Take by mouth.   atorvastatin  (LIPITOR) 10 MG tablet Take 1 tablet (10 mg total) by mouth daily at 6 PM.   carvedilol  (COREG ) 3.125 MG tablet Take 1 tablet (3.125 mg total) by mouth 2 (two) times daily with a meal.   Cholecalciferol (VITAMIN D -1000 MAX ST) 25 MCG (1000 UT) tablet Take 1 tablet by mouth daily.   clopidogrel  (PLAVIX ) 75 MG tablet Take 75 mg by mouth daily.   Cyanocobalamin (B-12) 1000 MCG TBCR Take 1,000 mcg by mouth every other day.  docusate sodium  (COLACE) 50 MG capsule Take 100 mg by mouth 2 (two) times daily as needed for mild constipation.   fexofenadine-pseudoephedrine (ALLEGRA-D 24) 180-240 MG 24 hr tablet Take 1 tablet by mouth daily as needed (allergies).   furosemide  (LASIX ) 40 MG tablet Take 40 mg by mouth daily.   glipiZIDE  2.5 MG TABS Take 1 tablet by mouth daily with breakfast.   levothyroxine  (SYNTHROID ) 25 MCG tablet TAKE 1 TABLET (25 MCG TOTAL)  BY MOUTH DAILY BEFORE BREAKFAST. ON SUNDAYS, TAKE 2 TABLETS BY MOUTH BEFORE BREAKFAST.   losartan  (COZAAR ) 25 MG tablet Take 1 tablet (25 mg total) by mouth at bedtime.   magnesium  oxide (MAG-OX) 400 MG tablet Take 400 mg by mouth daily.   metaxalone (SKELAXIN) 800 MG tablet Take 400 mg by mouth 2 (two) times daily.   metFORMIN  (GLUCOPHAGE -XR) 750 MG 24 hr tablet Take 2 tablets (1,500 mg total) by mouth daily with breakfast.   Nutritional Supplements (THERALITH XR PO) Take by mouth QID. 3.752-45-45-49.5mg . Take 2 in the evening and 2 at bedtime.   pantoprazole  (PROTONIX ) 40 MG tablet Take 1 tablet (40 mg total) by mouth daily.   polyethylene glycol powder (GLYCOLAX /MIRALAX ) 17 GM/SCOOP powder Take 17 g by mouth daily.   rOPINIRole  (REQUIP ) 1 MG tablet Take 1 tablet (1 mg total) by mouth 2 (two) times daily.   spironolactone  (ALDACTONE ) 25 MG tablet Take 12.5 mg by mouth daily.   tamsulosin  (FLOMAX ) 0.4 MG CAPS capsule Take 1 capsule (0.4 mg total) by mouth every evening.   triamcinolone  (KENALOG ) 0.1 % Apply 1 application topically 2 (two) times daily as needed (rash).   [DISCONTINUED] losartan  (COZAAR ) 50 MG tablet Take 50 mg by mouth daily.    Social History: Social History   Tobacco Use   Smoking status: Never   Smokeless tobacco: Never  Vaping Use   Vaping status: Never Used  Substance Use Topics   Alcohol use: No    Alcohol/week: 0.0 standard drinks of alcohol   Drug use: No    Family Medical History: Family History  Problem Relation Age of Onset   Heart disease Father     Physical Examination: Vitals:   05/11/23 0800 05/11/23 0815  BP: 103/79 124/79  Pulse: 63 64  Resp: (!) 22 14  Temp:    SpO2: 99% 99%   Heart sounds normal no MRG. Chest Clear to Auscultation Bilaterally.  General: Patient is in no apparent distress. Attention to examination is appropriate.  Neck:   Supple.  Full range of motion.  Respiratory: Patient is breathing without any  difficulty.   NEUROLOGICAL:     Awake, alert, oriented to person, place, and time.  Speech is clear and fluent.  Cranial Nerves: Pupils equal round and reactive to light.  Facial tone is symmetric.  Facial sensation is symmetric. Shoulder shrug is symmetric. Tongue protrusion is midline.  There is no pronator drift.  Strength: Side Biceps Triceps Deltoid Interossei Grip Wrist Ext. Wrist Flex.  R 5 5 5  4- 4- 4 4  L 4 4- 4- 3 2 2 2    Side Iliopsoas Quads Hamstring PF DF EHL  R 5 5 5 5 5 5   L 4+ 5 5 5 5 5    Reflexes are 3+ and symmetric at the biceps, triceps, brachioradialis, patella and achilles.   Hoffman's is present.   Bilateral upper and lower extremity sensation is intact to light touch with paresthesias in both hands.    No evidence of dysmetria noted.  Gait is untested.     Medical Decision Making  Imaging: MRI C spine 05/10/2023 IMPRESSION: 1. 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 Edema extends posteriorly to involve the C6-C7 disc, compatible with traumatic disc injury. Small volume of adjacent prevertebral edema. 2. At C3-C4, severe canal stenosis with cord compression. T2 hyperintensity in the cord at this level is compatible with cord edema and/or myelomalacia. Severe bilateral foraminal stenosis at this level. 3. At C4-C5, moderate to severe canal stenosis and severe bilateral foraminal stenosis. 4. At C6-C7, mild to moderate canal stenosis and moderate bilateral foraminal stenosis.     Electronically Signed   By: Davene Ernst.  I have personally reviewed the images and agree with the above interpretation.  Assessment and Plan: Mr. Criado is a pleasant 83 y.o. 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  There is no evidence of malalignment at C6/7. He does not have clear instability at that  level.  Due to ongoing compression at C3/4 and C4/5, I have recommended surgical decompression with C3-5 anterior cervical discectomy and fusion.  He will have 1 unit of platelets transfused and one given during surgery.  There is no role for conservative management given his spinal cord injury.  I discussed the planned procedure at length with the patient, including the risks, benefits, alternatives, and indications. The risks discussed include but are not limited to bleeding, infection, need for reoperation, spinal fluid leak, stroke, vision loss, anesthetic complication, coma, paralysis, and even death. We also discussed the possibility of post-operative dysphagia, vocal cord paralysis, and the risk of adjacent segment disease in the future. I also described in detail that improvement was not guaranteed.  The patient expressed understanding of these risks, and asked that we proceed with surgery. I described the surgery in layman's terms, and gave ample opportunity for questions, which were answered to the best of my ability.   He will remain in collar until follow up demonstrates healing from his C6/7 injury.  We will continue BP support until later this week.  Exact timing will be determined secondary to when his injury occurred.  I have discussed with Dr. Beau Bound and Dr. Aleskerov and team.    Melrose Squire. Mont Antis MD, [facility] Neurosurgery

## 2023-05-11 NOTE — Plan of Care (Addendum)
 A&O patient admitted to ICU-12 with  cervical spinal cord injury. Neurosurgery has been consulted; Neo was started in the ED to keep MAP>85. Patient has noted weakness in BL grip strength with right being greater than left, BLL extremities equal moderate strength in dorsal/plantar flexion, pupils are equal, round, reactive to light. Patient does have a cervical spine collar on and has c/o neck pain that scheduled Tylenol  and PRN fentanyl  has help to improve. Patient was oriented to room, call light and demonstrated understanding. Patient was educated about SCD's and are on the patient. Patient and wife created password at time of admission. Problem: Education: Goal: Ability to describe self-care measures that may prevent or decrease complications (Diabetes Survival Skills Education) will improve Outcome: Progressing   Problem: Coping: Goal: Ability to adjust to condition or change in health will improve Outcome: Progressing   Problem: Fluid Volume: Goal: Ability to maintain a balanced intake and output will improve Outcome: Progressing   Problem: Health Behavior/Discharge Planning: Goal: Ability to identify and utilize available resources and services will improve Outcome: Progressing Goal: Ability to manage health-related needs will improve Outcome: Progressing   Problem: Metabolic: Goal: Ability to maintain appropriate glucose levels will improve Outcome: Progressing   Problem: Tissue Perfusion: Goal: Adequacy of tissue perfusion will improve Outcome: Progressing   Problem: Education: Goal: Knowledge of General Education information will improve Description: Including pain rating scale, medication(s)/side effects and non-pharmacologic comfort measures Outcome: Progressing

## 2023-05-11 NOTE — Anesthesia Postprocedure Evaluation (Signed)
 Anesthesia Post Note  Patient: Billy Franco  Procedure(s) Performed: ANTERIOR CERVICAL DECOMPRESSION/DISCECTOMY FUSION 2 LEVELS  Patient location during evaluation: PACU Anesthesia Type: General Level of consciousness: awake and alert Pain management: pain level controlled Vital Signs Assessment: post-procedure vital signs reviewed and stable Respiratory status: spontaneous breathing, nonlabored ventilation, respiratory function stable and patient connected to nasal cannula oxygen Cardiovascular status: blood pressure returned to baseline and stable Postop Assessment: no apparent nausea or vomiting Anesthetic complications: no   There were no known notable events for this encounter.   Last Vitals:  Vitals:   05/11/23 1130 05/11/23 1536  BP: (!) 130/59 131/83  Pulse: 67 68  Resp: 20 13  Temp: 37.1 C (!) 36.2 C  SpO2: 98% 99%    Last Pain:  Vitals:   05/11/23 1143  TempSrc:   PainSc: Asleep                 Billy Franco

## 2023-05-12 ENCOUNTER — Encounter: Payer: Self-pay | Admitting: Neurosurgery

## 2023-05-12 ENCOUNTER — Other Ambulatory Visit: Payer: Self-pay

## 2023-05-12 LAB — CBC
HCT: 31.5 % — ABNORMAL LOW (ref 39.0–52.0)
Hemoglobin: 10.7 g/dL — ABNORMAL LOW (ref 13.0–17.0)
MCH: 32.7 pg (ref 26.0–34.0)
MCHC: 34 g/dL (ref 30.0–36.0)
MCV: 96.3 fL (ref 80.0–100.0)
Platelets: 243 10*3/uL (ref 150–400)
RBC: 3.27 MIL/uL — ABNORMAL LOW (ref 4.22–5.81)
RDW: 12.9 % (ref 11.5–15.5)
WBC: 11.3 10*3/uL — ABNORMAL HIGH (ref 4.0–10.5)
nRBC: 0 % (ref 0.0–0.2)

## 2023-05-12 LAB — BASIC METABOLIC PANEL WITH GFR
Anion gap: 11 (ref 5–15)
BUN: 35 mg/dL — ABNORMAL HIGH (ref 8–23)
CO2: 17 mmol/L — ABNORMAL LOW (ref 22–32)
Calcium: 8.5 mg/dL — ABNORMAL LOW (ref 8.9–10.3)
Chloride: 105 mmol/L (ref 98–111)
Creatinine, Ser: 1.63 mg/dL — ABNORMAL HIGH (ref 0.61–1.24)
GFR, Estimated: 42 mL/min — ABNORMAL LOW (ref >60)
Glucose, Bld: 165 mg/dL — ABNORMAL HIGH (ref 70–99)
Potassium: 3.8 mmol/L (ref 3.5–5.1)
Sodium: 133 mmol/L — ABNORMAL LOW (ref 135–145)

## 2023-05-12 LAB — GLUCOSE, CAPILLARY
Glucose-Capillary: 121 mg/dL — ABNORMAL HIGH (ref 70–99)
Glucose-Capillary: 129 mg/dL — ABNORMAL HIGH (ref 70–99)
Glucose-Capillary: 130 mg/dL — ABNORMAL HIGH (ref 70–99)
Glucose-Capillary: 131 mg/dL — ABNORMAL HIGH (ref 70–99)
Glucose-Capillary: 134 mg/dL — ABNORMAL HIGH (ref 70–99)
Glucose-Capillary: 142 mg/dL — ABNORMAL HIGH (ref 70–99)
Glucose-Capillary: 86 mg/dL (ref 70–99)

## 2023-05-12 LAB — PHOSPHORUS: Phosphorus: 4.6 mg/dL (ref 2.5–4.6)

## 2023-05-12 LAB — MAGNESIUM: Magnesium: 1.7 mg/dL (ref 1.7–2.4)

## 2023-05-12 MED ORDER — METOPROLOL TARTRATE 5 MG/5ML IV SOLN
2.5000 mg | Freq: Once | INTRAVENOUS | Status: AC | PRN
  Administered 2023-05-12: 2.5 mg via INTRAVENOUS
  Filled 2023-05-12: qty 5

## 2023-05-12 MED ORDER — DIAZEPAM 5 MG/ML IJ SOLN
5.0000 mg | Freq: Once | INTRAMUSCULAR | Status: AC
  Administered 2023-05-13: 5 mg via INTRAVENOUS

## 2023-05-12 MED ORDER — DIGOXIN 0.25 MG/ML IJ SOLN
0.5000 mg | Freq: Once | INTRAMUSCULAR | Status: AC
Start: 2023-05-12 — End: 2023-05-12
  Administered 2023-05-12: 0.5 mg via INTRAVENOUS
  Filled 2023-05-12: qty 2

## 2023-05-12 MED ORDER — METOPROLOL TARTRATE 5 MG/5ML IV SOLN: 2.5000 mg | Freq: Once | INTRAVENOUS | Status: DC

## 2023-05-12 MED ORDER — CYCLOBENZAPRINE HCL 5 MG PO TABS
5.0000 mg | ORAL_TABLET | Freq: Three times a day (TID) | ORAL | Status: DC | PRN
Start: 2023-05-12 — End: 2023-05-13

## 2023-05-12 MED ORDER — MAGNESIUM SULFATE 2 GM/50ML IV SOLN
2.0000 g | Freq: Once | INTRAVENOUS | Status: AC
  Administered 2023-05-12: 2 g via INTRAVENOUS
  Filled 2023-05-12: qty 50

## 2023-05-12 MED ORDER — TRAZODONE HCL 50 MG PO TABS
50.0000 mg | ORAL_TABLET | Freq: Every day | ORAL | Status: DC
  Administered 2023-05-13: 50 mg via ORAL
  Filled 2023-05-12: qty 1

## 2023-05-12 MED ORDER — LACTATED RINGERS IV BOLUS
250.0000 mL | Freq: Once | INTRAVENOUS | Status: AC
  Administered 2023-05-12: 250 mL via INTRAVENOUS

## 2023-05-12 NOTE — Progress Notes (Addendum)
   Neurosurgery Progress Note  History: Davell Beckstead is s/p C3-5 ACDF for cervical stenosis and myelopathy   POD1: Pt reporting continued weakness in his hands but improved leg sensation and proximal upper extremity moving. Swallowing well with minimal pain   Physical Exam: Vitals:   05/12/23 0730 05/12/23 0745  BP:    Pulse: 64 (!) 48  Resp: 18 13  Temp:    SpO2: 96% 95%    AA Ox3 CNI  Strength: Side Biceps Triceps Deltoid Interossei Grip Wrist Ext. Wrist Flex.  R 5 5 5  4- 4- 4 4  L 4 4+ 4+ 3 2 2 2     Side Iliopsoas Quads Hamstring PF DF EHL  R 5 5 5 5 5 5   L 4+ 5 5 5 5 5     JP output 33 since surgery  Data:  Other tests/results: see results review  Assessment/Plan:  Rexford Prevo is a 83 y.o presenting with acute progressive weakness found to have significant cervical stenosis at C3-4 and C4-5 with C6-7 ALL injury  - mobilize - pain control - DVT prophylaxis - will continue JP for now - continue MAP goals of >80 until morning of 4/24 - PTOT; patient to wear cervical collar when OOB and ambulating. Ok to remove when in bed and when eating  Anastacio Karvonen PA-C Department of Neurosurgery

## 2023-05-12 NOTE — Progress Notes (Addendum)
   Inpatient Rehab Admissions Coordinator:   Per therapy recommendations, patient was screened for CIR candidacy by Wandalee Gust, MS, CCC-SLP ). At this time, Pt. is not at a level to tolerate the intensity of CIR. Would like to see him able to transfer OOB and sit up in chair for at least an hour.   Pt. may have potential to progress to becoming a potential CIR candidate, so CIR admissions team will follow and monitor for progress and participation with therapies and place consult order if Pt. appears to be an appropriate candidate. Please contact me with any questions.    Wandalee Gust, MS, CCC-SLP Rehab Admissions Coordinator  [phone] (celll) [phone] (office)

## 2023-05-12 NOTE — Progress Notes (Signed)
 Wedding ring taken off patients swollen finger and given to patients wife

## 2023-05-12 NOTE — Progress Notes (Signed)
 PHARMACY CONSULT NOTE - FOLLOW UP  Pharmacy Consult for Electrolyte Monitoring and Replacement   Recent Labs: Potassium (mmol/L)  Date Value  05/12/2023 3.8  08/17/2013 4.1   Magnesium  (mg/dL)  Date Value  09/81/1914 1.7  08/15/2013 1.8   Calcium  (mg/dL)  Date Value  78/29/5621 8.5 (L)   Calcium , Total (mg/dL)  Date Value  30/86/5784 8.7   Albumin (g/dL)  Date Value  69/62/9528 4.0  10/30/2014 4.4   Phosphorus (mg/dL)  Date Value  41/32/4401 4.6   Sodium (mmol/L)  Date Value  05/12/2023 133 (L)  10/30/2014 140  08/17/2013 138     Assessment: 83 y.o. male  with a past medical history of CAD s/p stent, chronic systolic congestive heart failure (EF 25% - 30% - 04/2020), Ischemic Cardiomyopathy (refuses AICD), AF RVR (not on Eliquis  due to hematuria), T2DM, CKD, hypothyroidism who presented to the ED on 05/10/2023 for falling out of a chair on Friday.   Goal of Therapy:  Electrolytes WNL  Plan:  ---2 grams IV magnesium  sulfate x 1 ---recheck electrolytes in am  Adalberto Acton ,PharmD Clinical Pharmacist 05/12/2023 7:02 AM

## 2023-05-12 NOTE — Progress Notes (Addendum)
 PIV consult: BUE assessed with US . Non-compressible vein noted in L lateral forearm. Recommend central line for this pt as he has limited options for PIV.  1930: PICC order noted. Message to RN and MD notifying both there is no PICC RN coverage this evening. Please consider central line.

## 2023-05-12 NOTE — Progress Notes (Signed)
 Pt has become hyperfixated on his bed, bed not feeling level and falling in a whole, MD and charge nurse notified, no new orders placed at this time, will continue to monitor.

## 2023-05-12 NOTE — Progress Notes (Signed)
 [facility] CLINIC CARDIOLOGY PROGRESS NOTE       Patient ID: Billy Franco MRN: [id number] DOB/AGE: 21-Sep-1940 83 y.o.  Admit date: 05/10/2023 Referring Physician Dr. Duayne Gey Primary Physician Ava Lei, Arvis Laura, MD Primary Cardiologist Dr. Beau Bound Reason for Consultation Cardiac Clearance   HPI: Billy Franco is a 83 y.o. male  with a past medical history of CAD s/p stent, chronic systolic congestive heart failure (EF 25% - 30% - 04/2020), Ischemic Cardiomyopathy (refuses AICD), AF RVR (not on Eliquis  due to hematuria), T2DM, CKD, hypothyroidism who presented to the ED on 05/10/2023 for falling out of a chair on Friday. Patient is requiring surgical intervention and needs cardiac clearance. Cardiology was consulted for further evaluation.   Interval history: -Patient seen and examined this AM, resting comfortably in hospital bed.  -No complaints of CP, SOB, palpitations, dizziness.  -HR low while sleeping but increases when awake and talking. BP stable, on neo for MAP >85 per neurosurgery recommendations.  -Cr slightly up today, home GDMT held at this time.   Review of systems complete and found to be negative unless listed above    Past Medical History:  Diagnosis Date   Arthritis    Lower back, hips   Atrial fibrillation (HCC)    CHF (congestive heart failure) (HCC)    CKD (chronic kidney disease), stage III (HCC)    Complication of anesthesia    has "crooked airway"  Diff breathing after extubated (after kidney stone procedure 07/2013)   Diabetes mellitus without complication (HCC)    GERD (gastroesophageal reflux disease)    Hypertension    Hypothyroidism    Nephrolithiasis     Past Surgical History:  Procedure Laterality Date   CATARACT EXTRACTION W/PHACO Right 05/07/2021   Procedure: CATARACT EXTRACTION PHACO AND INTRAOCULAR LENS PLACEMENT (IOC) RIGHT DIABETIC 8.63 00:56.5;  Surgeon: Clair Crews, MD;  Location: [facility];  Service: Ophthalmology;   Laterality: Right;  Diabetic   CATARACT EXTRACTION W/PHACO Left 05/21/2021   Procedure: CATARACT EXTRACTION PHACO AND INTRAOCULAR LENS PLACEMENT (IOC) LEFT DIABETIC 7.27 01:01.5;  Surgeon: Clair Crews, MD;  Location: [facility];  Service: Ophthalmology;  Laterality: Left;  Diabetic   CORONARY STENT INTERVENTION N/A 03/08/2020   Procedure: CORONARY STENT INTERVENTION;  Surgeon: Antonette Batters, MD;  Location: [facility] INVASIVE CV LAB;  Service: Cardiovascular;  Laterality: N/A;   LEFT HEART CATH AND CORONARY ANGIOGRAPHY Left 03/08/2020   Procedure: LEFT HEART CATH AND CORONARY ANGIOGRAPHY;  Surgeon: Antonette Batters, MD;  Location: [facility] INVASIVE CV LAB;  Service: Cardiovascular;  Laterality: Left;   NASAL FRACTURE SURGERY  1994   URETEROLITHOTOMY Right 2015    Medications Prior to Admission  Medication Sig Dispense Refill Last Dose/Taking   acetaminophen  (TYLENOL ) 650 MG CR tablet Take 650 mg by mouth every 8 (eight) hours as needed for pain.   05/10/2023 Morning   aluminum hydroxide-magnesium  carbonate (GAVISCON) 95-358 MG/15ML SUSP Take by mouth.   Taking   atorvastatin  (LIPITOR) 10 MG tablet Take 1 tablet (10 mg total) by mouth daily at 6 PM. 90 tablet 1 05/09/2023 Evening   carvedilol  (COREG ) 3.125 MG tablet Take 1 tablet (3.125 mg total) by mouth 2 (two) times daily with a meal. 60 tablet 0 05/10/2023 Morning   Cholecalciferol (VITAMIN D -1000 MAX ST) 25 MCG (1000 UT) tablet Take 1 tablet by mouth daily.   Taking   clopidogrel  (PLAVIX ) 75 MG tablet Take 75 mg by mouth daily.   05/10/2023 Morning   Cyanocobalamin (B-12) 1000 MCG TBCR Take  1,000 mcg by mouth every other day.   05/10/2023 Morning   docusate sodium  (COLACE) 50 MG capsule Take 100 mg by mouth 2 (two) times daily as needed for mild constipation.   Unknown   fexofenadine-pseudoephedrine (ALLEGRA-D 24) 180-240 MG 24 hr tablet Take 1 tablet by mouth daily as needed (allergies).   Unknown   furosemide  (LASIX ) 40 MG tablet Take  40 mg by mouth daily.   05/10/2023 Morning   glipiZIDE  2.5 MG TABS Take 1 tablet by mouth daily with breakfast. 90 tablet 1 Taking   levothyroxine  (SYNTHROID ) 25 MCG tablet TAKE 1 TABLET (25 MCG TOTAL) BY MOUTH DAILY BEFORE BREAKFAST. ON SUNDAYS, TAKE 2 TABLETS BY MOUTH BEFORE BREAKFAST. 102 tablet 1 05/10/2023 Morning   losartan  (COZAAR ) 25 MG tablet Take 1 tablet (25 mg total) by mouth at bedtime. 30 tablet 0 05/09/2023 Bedtime   magnesium  oxide (MAG-OX) 400 MG tablet Take 400 mg by mouth daily.   05/09/2023 Evening   metaxalone (SKELAXIN) 800 MG tablet Take 400 mg by mouth 2 (two) times daily.   05/09/2023 Bedtime   metFORMIN  (GLUCOPHAGE -XR) 750 MG 24 hr tablet Take 2 tablets (1,500 mg total) by mouth daily with breakfast. 180 tablet 1 05/10/2023 Morning   Nutritional Supplements (THERALITH XR PO) Take by mouth QID. 3.752-45-45-49.5mg . Take 2 in the evening and 2 at bedtime.   05/10/2023 Morning   pantoprazole  (PROTONIX ) 40 MG tablet Take 1 tablet (40 mg total) by mouth daily. 90 tablet 3 05/09/2023 Evening   polyethylene glycol powder (GLYCOLAX /MIRALAX ) 17 GM/SCOOP powder Take 17 g by mouth daily.   Unknown   rOPINIRole  (REQUIP ) 1 MG tablet Take 1 tablet (1 mg total) by mouth 2 (two) times daily. 180 tablet 1 05/09/2023 Bedtime   spironolactone (ALDACTONE) 25 MG tablet Take 12.5 mg by mouth daily.   05/10/2023   tamsulosin  (FLOMAX ) 0.4 MG CAPS capsule Take 1 capsule (0.4 mg total) by mouth every evening. 90 capsule 2 05/09/2023 Evening   triamcinolone  (KENALOG ) 0.1 % Apply 1 application topically 2 (two) times daily as needed (rash).   Unknown   blood glucose meter kit and supplies 1 each by Other route as directed. Dispense based on patient and insurance preference. Use up to four times daily as directed. (FOR ICD-10 E10.9, E11.9). 1 each 1    glucose blood (ONETOUCH ULTRA) test strip USE AS DIRECTED TO CHECK BLOOD GLUCOSE ONCE DAILY. 100 strip 3    Lancets (ONETOUCH DELICA PLUS LANCET33G) MISC CHECK BLOOD  SUGAR AT NOON 100 each 1    Social History   Socioeconomic History   Marital status: Married    Spouse name: Sport and exercise psychologist    Number of children: 3   Years of education: Not on file   Highest education level: Not on file  Occupational History   Occupation: retired     Comment: IT trainer at a [employer]   Tobacco Use   Smoking status: Never   Smokeless tobacco: Never  Vaping Use   Vaping status: Never Used  Substance and Sexual Activity   Alcohol use: No    Alcohol/week: 0.0 standard drinks of alcohol   Drug use: No   Sexual activity: Not Currently  Other Topics Concern   Not on file  Social History Narrative   Married, had 3 children, one died at a MVA at age 71   Geralynn Knife of a church    Social Drivers of Corporate investment banker Strain: Low Risk  (01/24/2019)   Overall  Financial Resource Strain (CARDIA)    Difficulty of Paying Living Expenses: Not hard at all  Food Insecurity: No Food Insecurity (05/11/2023)   Hunger Vital Sign    Worried About Running Out of Food in the Last Year: Never true    Ran Out of Food in the Last Year: Never true  Transportation Needs: No Transportation Needs (05/11/2023)   PRAPARE - Administrator, Civil Service (Medical): No    Lack of Transportation (Non-Medical): No  Physical Activity: Sufficiently Active (01/24/2019)   Exercise Vital Sign    Days of Exercise per Week: 5 days    Minutes of Exercise per Session: 60 min  Stress: No Stress Concern Present (01/24/2019)   [employer] of Occupational Health - Occupational Stress Questionnaire    Feeling of Stress : Not at all  Social Connections: Unknown (05/11/2023)   Social Connection and Isolation Panel [NHANES]    Frequency of Communication with Friends and Family: More than three times a week    Frequency of Social Gatherings with Friends and Family: More than three times a week    Attends Religious Services: More than 4 times per year    Active Member of [employer] or  Organizations: Not on file    Attends Banker Meetings: Not on file    Marital Status: Married  Intimate Partner Violence: Not At Risk (05/11/2023)   Humiliation, Afraid, Rape, and Kick questionnaire    Fear of Current or Ex-Partner: No    Emotionally Abused: No    Physically Abused: No    Sexually Abused: No    Family History  Problem Relation Age of Onset   Heart disease Father      Vitals:   05/12/23 0715 05/12/23 0730 05/12/23 0745 05/12/23 0800  BP:      Pulse: (!) 49 64 (!) 48 60  Resp: 14 18 13 17   Temp:    97.8 F (36.6 C)  TempSrc:    Oral  SpO2: 95% 96% 95% 95%  Weight:      Height:        PHYSICAL EXAM General: Well appearing elderly male, well nourished, in no acute distress. HEENT: Normocephalic and atraumatic. Neck: No JVD.   Lungs: Normal respiratory effort on room air. Clear bilaterally to auscultation. No wheezes, crackles, rhonchi.  Heart: HRRR. Normal S1 and S2 without gallops or murmurs.  Abdomen: Non-distended appearing.  Msk: Normal strength and tone for age. Extremities: Warm and well perfused. No clubbing, cyanosis. No edema.  Neuro: Alert and oriented X 3. Psych: Answers questions appropriately.   Labs: Basic Metabolic Panel: Recent Labs    05/11/23 0300 05/12/23 0545  NA 138 133*  K 3.8 3.8  CL 103 105  CO2 23 17*  GLUCOSE 84 165*  BUN 32* 35*  CREATININE 1.40* 1.63*  CALCIUM  9.4 8.5*  MG 1.9 1.7  PHOS 3.2 4.6   Liver Function Tests: Recent Labs    05/10/23 1621  AST 30  ALT 22  ALKPHOS 57  BILITOT 1.0  PROT 7.1  ALBUMIN 4.0   No results for input(s): "LIPASE", "AMYLASE" in the last 72 hours. CBC: Recent Labs    05/10/23 1621 05/11/23 0300 05/12/23 0545  WBC 6.4 9.2 11.3*  NEUTROABS 4.5  --   --   HGB 10.6* 12.0* 10.7*  HCT 32.5* 36.3* 31.5*  MCV 98.5 96.3 96.3  PLT 210 262 243   Cardiac Enzymes: Recent Labs    05/10/23 1621  CKTOTAL  292   BNP: No results for input(s): "BNP" in the last 72  hours. D-Dimer: No results for input(s): "DDIMER" in the last 72 hours. Hemoglobin A1C: No results for input(s): "HGBA1C" in the last 72 hours. Fasting Lipid Panel: No results for input(s): "CHOL", "HDL", "LDLCALC", "TRIG", "CHOLHDL", "LDLDIRECT" in the last 72 hours. Thyroid  Function Tests: No results for input(s): "TSH", "T4TOTAL", "T3FREE", "THYROIDAB" in the last 72 hours.  Invalid input(s): "FREET3" Anemia Panel: No results for input(s): "VITAMINB12", "FOLATE", "FERRITIN", "TIBC", "IRON", "RETICCTPCT" in the last 72 hours.   Radiology: DG Cervical Spine 2-3 Views Result Date: 05/11/2023 CLINICAL DATA:  Elective surgery. EXAM: CERVICAL SPINE - 2-3 VIEW COMPARISON:  Preoperative imaging FINDINGS: Two fluoroscopic spot views of the cervical spine submitted from the operating room. Anterior fusion C3 through C5 with interbody spacers. Fluoroscopy time 4 seconds. Dose 0.29 mGy. IMPRESSION: Intraoperative fluoroscopy during cervical spine fusion. Electronically Signed   By: Chadwick Colonel M.D.   On: 05/11/2023 17:04   DG C-Arm 1-60 Min-No Report Result Date: 05/11/2023 Fluoroscopy was utilized by the requesting physician.  No radiographic interpretation.   DG C-Arm 1-60 Min-No Report Result Date: 05/11/2023 Fluoroscopy was utilized by the requesting physician.  No radiographic interpretation.   MR Cervical Spine Wo Contrast Result Date: 05/10/2023 CLINICAL DATA:  Ataxia, cervical trauma arm weakness, eval central cord EXAM: MRI CERVICAL SPINE WITHOUT CONTRAST TECHNIQUE: Multiplanar, multisequence MR imaging of the cervical spine was performed. No intravenous contrast was administered. COMPARISON:  None Available. FINDINGS: Alignment: No substantial sagittal subluxation. Vertebrae: No bone marrow edema to suggest acute fracture. Edema in the anterior C6-C7 disc space with extension anteriorly and destruction of the anterior longitudinal ligament (see series 3, image 8). Adjacent prevertebral  edema which extends inferiorly into the upper thoracic spine. Cord: T2 hyperintensity within the cord at C3-C4. Posterior Fossa, vertebral arteries, paraspinal tissues: The visualized 2 artery flow voids are maintained. Small volume of prevertebral edema in the lower cervical spine extending into the upper thoracic spine. Disc levels: C2-C3: Small posterior disc osteophyte complex. Mild canal stenosis. Bilateral facet uncovertebral hypertrophy with mild left foraminal stenosis. C3-C4: Posterior disc osteophyte complex and ligamentum flavum thickening. Resulting severe canal stenosis with cord compression. Bilateral facet uncovertebral hypertrophy. Resulting severe bilateral foraminal stenosis. C4-C5: Posterior disc osteophyte complex and ligamentum flavum thickening. Resulting moderate to severe canal stenosis. Bilateral facet and vertebral hypertrophy with severe bilateral foraminal stenosis. C5-C6: Segmentation anomaly. Partial fusion across the disc. Bilateral facet uncovertebral hypertrophy. Canal and foramina are patent. C6-C7: Posterior disc osteophyte complex with central disc protrusion. Bilateral facet and uncovertebral hypertrophy. Moderate bilateral foraminal stenosis. Mild to moderate canal stenosis. C7-T1: Uncovertebral hypertrophy without significant canal or foraminal stenosis. IMPRESSION: 1. 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 Edema extends posteriorly to involve the C6-C7 disc, compatible with traumatic disc injury. Small volume of adjacent prevertebral edema. 2. At C3-C4, severe canal stenosis with cord compression. T2 hyperintensity in the cord at this level is compatible with cord edema and/or myelomalacia. Severe bilateral foraminal stenosis at this level. 3. At C4-C5, moderate to severe canal stenosis and severe bilateral foraminal stenosis. 4. At C6-C7, mild to moderate canal stenosis and moderate bilateral foraminal stenosis. Electronically Signed   By: Stevenson Elbe M.D.   On: 05/10/2023 20:41   MR BRAIN WO CONTRAST Result Date: 05/10/2023 CLINICAL DATA:  Neuro deficit, acute, stroke suspected left sided weakness EXAM: MRI HEAD WITHOUT CONTRAST TECHNIQUE: Multiplanar, multiecho pulse sequences of the brain and surrounding  structures were obtained without intravenous contrast. COMPARISON:  CT head and CT cervical spine from earlier today. FINDINGS: Brain: No acute infarction, hemorrhage, hydrocephalus, extra-axial collection or mass lesion. Remote right PCA territory infarct. Vascular: Major arterial flow voids are maintained at the skull base. Skull and upper cervical spine: Normal marrow signal. Sinuses/Orbits: Negative. IMPRESSION: 1. No evidence of acute intracranial abnormality. 2. Remote right PCA territory infarct. Electronically Signed   By: Stevenson Elbe M.D.   On: 05/10/2023 20:22   CT Thoracic Spine Wo Contrast Result Date: 05/10/2023 CLINICAL DATA:  Back trauma, fall from chair. EXAM: CT THORACIC AND LUMBAR SPINE WITHOUT CONTRAST TECHNIQUE: Multidetector CT imaging of the thoracic and lumbar spine was performed without contrast. Multiplanar CT image reconstructions were also generated. RADIATION DOSE REDUCTION: This exam was performed according to the departmental dose-optimization program which includes automated exposure control, adjustment of the mA and/or kV according to patient size and/or use of iterative reconstruction technique. COMPARISON:  10/08/2022, 04/28/2022 FINDINGS: CT THORACIC SPINE FINDINGS Alignment: Normal. Vertebrae: No acute fracture or focal pathologic process. Paraspinal and other soft tissues: No paraspinal fat stranding or epidural hematoma. Aortic atherosclerosis is noted. There are multi-vessel coronary artery calcifications. Pleural and parenchymal scarring is noted at the lung apices. There are trace bilateral pleural effusions with atelectasis. Disc levels: Multilevel degenerative endplate changes. There is calcification  of the supraspinous ligament. CT LUMBAR SPINE FINDINGS Segmentation: 5 lumbar type vertebrae. Alignment: Normal. Vertebrae: There is a old fracture of the L1, L2, and L3 transverse processes on the left with bone callus formation. No acute fracture is seen. Paraspinal and other soft tissues: No acute abnormality. Aortic atherosclerosis is seen. Left renal calculi are noted. Disc levels: Multi level degenerative endplate changes and facet arthropathy. There is a disc herniation at L4-L5 resulting in severe spinal canal stenosis and moderate to severe neural foraminal stenosis bilaterally, greater on the left than on the right. The spinal canal and neural foramina are otherwise patent. IMPRESSION: CT THORACIC SPINE IMPRESSION 1. Multilevel degenerative changes without evidence of acute fracture. 2. Trace bilateral pleural effusions with atelectasis. 3. Aortic atherosclerosis with coronary artery calcifications. CT LUMBAR SPINE IMPRESSION 1. 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 2. Multilevel degenerative changes in the lumbar spine as described above. 3. Left renal calculi. Electronically Signed   By: Wyvonnia Heimlich M.D.   On: 05/10/2023 20:01   CT Lumbar Spine Wo Contrast Result Date: 05/10/2023 CLINICAL DATA:  Back trauma, fall from chair. EXAM: CT THORACIC AND LUMBAR SPINE WITHOUT CONTRAST TECHNIQUE: Multidetector CT imaging of the thoracic and lumbar spine was performed without contrast. Multiplanar CT image reconstructions were also generated. RADIATION DOSE REDUCTION: This exam was performed according to the departmental dose-optimization program which includes automated exposure control, adjustment of the mA and/or kV according to patient size and/or use of iterative reconstruction technique. COMPARISON:  10/08/2022, 04/28/2022 FINDINGS: CT THORACIC SPINE  FINDINGS Alignment: Normal. Vertebrae: No acute fracture or focal pathologic process. Paraspinal and other soft tissues: No paraspinal fat stranding or epidural hematoma. Aortic atherosclerosis is noted. There are multi-vessel coronary artery calcifications. Pleural and parenchymal scarring is noted at the lung apices. There are trace bilateral pleural effusions with atelectasis. Disc levels: Multilevel degenerative endplate changes. There is calcification of the supraspinous ligament. CT LUMBAR SPINE FINDINGS Segmentation: 5 lumbar type vertebrae. Alignment: Normal. Vertebrae: There is a old fracture of the L1,  L2, and L3 transverse processes on the left with bone callus formation. No acute fracture is seen. Paraspinal and other soft tissues: No acute abnormality. Aortic atherosclerosis is seen. Left renal calculi are noted. Disc levels: Multi level degenerative endplate changes and facet arthropathy. There is a disc herniation at L4-L5 resulting in severe spinal canal stenosis and moderate to severe neural foraminal stenosis bilaterally, greater on the left than on the right. The spinal canal and neural foramina are otherwise patent. IMPRESSION: CT THORACIC SPINE IMPRESSION 1. Multilevel degenerative changes without evidence of acute fracture. 2. Trace bilateral pleural effusions with atelectasis. 3. Aortic atherosclerosis with coronary artery calcifications. CT LUMBAR SPINE IMPRESSION 1. 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 2. Multilevel degenerative changes in the lumbar spine as described above. 3. Left renal calculi. Electronically Signed   By: Wyvonnia Heimlich M.D.   On: 05/10/2023 20:01   CT Cervical Spine Wo Contrast Result Date: 05/10/2023 CLINICAL DATA:  Fall, weakness EXAM: CT CERVICAL SPINE WITHOUT CONTRAST TECHNIQUE: Multidetector CT imaging of the  cervical spine was performed without intravenous contrast. Multiplanar CT image reconstructions were also generated. RADIATION DOSE REDUCTION: This exam was performed according to the departmental dose-optimization program which includes automated exposure control, adjustment of the mA and/or kV according to patient size and/or use of iterative reconstruction technique. COMPARISON:  None Available. FINDINGS: Alignment: No subluxation.  Facet alignment within normal limits. Skull base and vertebrae: No fracture Soft tissues and spinal canal: No prevertebral fluid or swelling. No visible canal hematoma. Disc levels: Prominent multilevel anterior osteophytes. Fusion of the C5-C6 vertebral bodies and posterior elements. Multilevel degenerative changes with osteophytes and mild disc space narrowing. Small central disc protrusion at C2-C3. At least mild canal stenosis. Posterior disc osteophyte complex at C3-C4 with moderate severe canal stenosis. Posterior disc osteophyte C4-C5 with at least moderate canal stenosis. Posterior disc osteophyte C6-C7 with at least mild canal stenosis and mild mass effect on the anterior thecal sac. Multilevel facet degenerative change. Multilevel foraminal narrowing. Upper chest: Apical scarring Other: None IMPRESSION: 1. Negative for acute fracture or subluxation of the cervical spine. 2. 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 Electronically Signed   By: Esmeralda Hedge M.D.   On: 05/10/2023 17:03   DG Chest Port 1 View Result Date: 05/10/2023 CLINICAL DATA:  Weakness EXAM: PORTABLE CHEST 1 VIEW COMPARISON:  12/21/2019 FINDINGS: Pleural-parenchymal scarring at the apices. No consolidation, pleural effusion or pneumothorax. Borderline to mild cardiomegaly. Aortic atherosclerosis. IMPRESSION: No active disease. Borderline to mild cardiomegaly. Electronically Signed   By: Esmeralda Hedge M.D.   On: 05/10/2023 16:55   CT Head Wo  Contrast Result Date: 05/10/2023 CLINICAL DATA:  Head trauma weakness EXAM: CT HEAD WITHOUT CONTRAST TECHNIQUE: Contiguous axial images were obtained from the base of the skull through the vertex without intravenous contrast. RADIATION DOSE REDUCTION: This exam was performed according to the departmental dose-optimization program which includes automated exposure control, adjustment of the mA and/or kV according to patient size and/or use of iterative reconstruction technique. COMPARISON:  CT brain 05/02/2019 FINDINGS: Brain: Mild motion degradation limits the exam. Interval small chronic appearing right occipital infarct. Mild atrophy. Patchy white matter hypodensity most likely chronic small vessel ischemic change. The ventricles are non enlarged. Vascular: No hyperdense vessels.  No unexpected calcification Skull: No definitive fracture but limited by motion Sinuses/Orbits: No acute  finding. Other: None IMPRESSION: 1. Mild motion degradation limits the exam. No definite CT evidence for acute intracranial abnormality. 2. Atrophy and chronic small vessel ischemic changes of the white matter. Interval small chronic appearing right occipital infarct, new compared to 2021 head CT. Electronically Signed   By: Esmeralda Hedge M.D.   On: 05/10/2023 16:53    ECHO: 05/14/2020  MODERATE LV SYSTOLIC DYSFUNCTION  MILD RV SYSTOLIC DYSFUNCTION  NO VALVULAR STENOSIS  MODERATE MR, TR  MILD AR, PR  EF 30%   TELEMETRY reviewed by me 05/12/2023: Sinus rhythm rate 40s  EKG reviewed by me: Sinus rhythm with PACs, rate 67 bpm  Data reviewed by me 05/12/2023: last 24h vitals tele labs imaging I/O ED provider note, admission H&P, neurosurgery, critical care notes.  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 male  with a past medical history of CAD s/p stent (2022), chronic systolic congestive heart failure (EF 25% - 30% - 04/2020), Ischemic Cardiomyopathy (refuses AICD), AF RVR (not on Eliquis  due to hematuria), T2DM, CKD, hypothyroidism who presented to the ED on 05/10/2023 for falling out of a chair on Friday. Patient is requiring surgical intervention and needs cardiac clearance. Cardiology was consulted for further evaluation.   # Fall, Fracture/disc herniation at C5/6 with moderate stenosis #Paroxysmal Atrial fibrillation # Chronic systolic congestive heart failure (EF 25% - 30% - 04/2020)  # Ischemic Cardiomyopathy (refuses AICD) # CAD s/p stent placement Patient presents to the ED after falling from chair on Friday leading to a new fracture/disc herniation at C5/6 s/p surgical decompression 05/11/23. Patient has history of atrial fibrillation, however has been in sinus rhyme since admission. Patient is not on anticoagulants due to history of hematuria. -Continue holding plavix , likely resume tomorrow.  -Continue Atorvastatin  10 mg daily. -Continue carvedilol  3.125 mg twice daily, HOLD ONLY IF HR <50 BPM. -Plan to resume home GDMT post-op as BP and renal function allow.  This patient's plan of care was discussed and created with Dr. Bob Burn and he is in agreement.    Signed: Hamp Levine, PA-C  05/12/2023, 9:16 AM [facility]

## 2023-05-12 NOTE — Plan of Care (Signed)
  Problem: Education: Goal: Ability to describe self-care measures that may prevent or decrease complications (Diabetes Survival Skills Education) will improve Outcome: Progressing   Problem: Coping: Goal: Ability to adjust to condition or change in health will improve Outcome: Progressing   Problem: Fluid Volume: Goal: Ability to maintain a balanced intake and output will improve Outcome: Not Progressing   Problem: Health Behavior/Discharge Planning: Goal: Ability to identify and utilize available resources and services will improve Outcome: Progressing Goal: Ability to manage health-related needs will improve Outcome: Progressing   Problem: Metabolic: Goal: Ability to maintain appropriate glucose levels will improve Outcome: Progressing   Problem: Nutritional: Goal: Maintenance of adequate nutrition will improve Outcome: Progressing

## 2023-05-12 NOTE — Progress Notes (Signed)
 Pts HR jumped up into 120's-140's, ICU, cardiology and neurosurgery made aware, gave coreg  that was due at 1500 that I had held due to him being SB-low NSR all day. No new orders at this time, will continue to monitor

## 2023-05-12 NOTE — Evaluation (Signed)
 Occupational Therapy Evaluation Patient Details Name: Billy Franco MRN: [id number] DOB: [birth year] Today's Date: 05/12/2023   History of Present Illness   83 y/o male presented to ED on 05/10/23 after falling out of chair on 4/18 with progressive weakness. 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 S/p ACDF C3/4 and C4/5 on 4/21. PMH: CAD s/p stent, CHF, ischemic cardiomyopathy, Afib, T2DM, CKD, HTN     Clinical Impressions Chart reviewed, pt greeted in bed, alert and oriented x4, anxious with mobility. PTA pt is indep in ADL/IADL, amb with no AD. Pt presents with deficits in strength, endurance, activity tolerance, balance, BUE function, BLE function/sensation. MAX A +2 required for bed mobility with frequent cueing for technique with pt resistive at times, ultimately agreeable with increased cueing. Pt sat on edge of bed for approx 15-20 minutes with MOD-MAX A with brief moments of CGA-MIN A, improved with anterior weight shift. Pt with lots of anxiety regarding mobility and benefits from increased time and cueing. MAX A required for LB dressing/feeding/grooming at this time. Pt is motivated to return to PLOF as he was independent PTA. Pt will benefit from acute OT to address functional deficits to facilitate optimal ADL performance and intensive therapy post acute stay to facilitate return to PLOF.      If plan is discharge home, recommend the following:   A lot of help with walking and/or transfers;A lot of help with bathing/dressing/bathroom;Assist for transportation;Assistance with cooking/housework;Help with stairs or ramp for entrance     Functional Status Assessment   Patient has had a recent decline in their functional status and demonstrates the ability to make significant improvements in function in a reasonable and predictable amount of time.     Equipment Recommendations    Other (comment) (defer to next venue of care)     Recommendations for Other Services   Rehab consult     Precautions/Restrictions   Precautions Precautions: Fall;Cervical Recall of Precautions/Restrictions: Intact Required Braces or Orthoses: Cervical Brace Cervical Brace: Hard collar;Other (comment) (for OOB)     Mobility Bed Mobility Overal bed mobility: Needs Assistance Bed Mobility: Rolling, Sidelying to Sit, Sit to Sidelying Rolling: Max assist, +2 for physical assistance, +2 for safety/equipment Sidelying to sit: Max assist, +2 for physical assistance, +2 for safety/equipment     Sit to sidelying: Max assist, +2 for physical assistance, +2 for safety/equipment General bed mobility comments: step by step mutli modal cues for technique    Transfers                          Balance Overall balance assessment: Needs assistance Sitting-balance support: No upper extremity supported, Feet supported Sitting balance-Leahy Scale: Poor Sitting balance - Comments: requires mod-maxA for sitting balance. Brief moments of CGA with patient performing anterior lean Postural control: Posterior lean                                 ADL either performed or assessed with clinical judgement   ADL Overall ADL's : Needs assistance/impaired Eating/Feeding: Maximal assistance   Grooming: Wash/dry face;Maximal assistance               Lower Body Dressing: Maximal assistance Lower Body Dressing Details (indicate cue type and reason): socks  Vision Patient Visual Report: No change from baseline       Perception         Praxis         Pertinent Vitals/Pain Pain Assessment Pain Assessment: 0-10 Pain Score: 10-Worst pain ever Pain Location: neck Pain Descriptors / Indicators: Sore, Grimacing Pain Intervention(s): Monitored during session, Limited activity within patient's tolerance, Repositioned      Extremity/Trunk Assessment Upper Extremity Assessment Upper Extremity Assessment: Right hand dominant;RUE deficits/detail;LUE deficits/detail RUE Deficits / Details: limited due to a-line- but grossly pt with limitations in AROM shoulder flexion ( to approx 90 degrees), able to open/close hands approx 1/4 full range of motion RUE Sensation: WNL RUE Coordination: decreased fine motor;decreased gross motor LUE Deficits / Details: AROM: shoulder flexion approx to 1/2 full AROM, elbow flexion/extension approx 3/4 full AROM, wrist 1/4 full AROM, open/close hands approx 1/4 full AROM; PROM grossly WFL; +tenodesis LUE Sensation: WNL   Lower Extremity Assessment Lower Extremity Assessment: Defer to PT evaluation RLE Deficits / Details: grossly 2-/5 overall RLE Sensation: decreased light touch;decreased proprioception LLE Deficits / Details: grossly 2-/5 overall LLE Sensation: decreased light touch;decreased proprioception   Cervical / Trunk Assessment Cervical / Trunk Assessment: Neck Surgery   Communication Communication Communication: No apparent difficulties Factors Affecting Communication: Reduced clarity of speech   Cognition Arousal: Alert Behavior During Therapy: Anxious Cognition: Cognition impaired     Awareness: Online awareness impaired   Attention impairment (select first level of impairment): Sustained attention Executive functioning impairment (select all impairments): Reasoning, Problem solving                   Following commands: Impaired Following commands impaired: Follows one step commands with increased time     Cueing  General Comments   Cueing Techniques: Verbal cues;Gestural cues  vss on 2L with MAP 80-85 throughout mobility   Exercises Other Exercises Other Exercises: edu pt/family re: role of OT, role of rehab, discharge recommendations, HEP   Shoulder Instructions      Home Living Family/patient expects to be discharged to:: Private  residence Living Arrangements: Spouse/significant other Available Help at Discharge: Available 24 hours/day;Family Type of Home: House Home Access: Stairs to enter [employer] of Steps: 1 + 1/2 step Entrance Stairs-Rails: Left Home Layout: One level     Bathroom Shower/Tub: Chief Strategy Officer: Standard Bathroom Accessibility: No   Home Equipment: BSC/3in1          Prior Functioning/Environment Prior Level of Function : Independent/Modified Independent;Driving;History of Falls (last six months)             Mobility Comments: amb with no AD; two falls ADLs Comments: very indep including weeding, mowing, driving    OT Problem List: Decreased strength;Decreased activity tolerance;Decreased knowledge of use of DME or AE;Decreased safety awareness;Impaired balance (sitting and/or standing);Impaired UE functional use   OT Treatment/Interventions: Self-care/ADL training;Therapeutic exercise;Patient/family education;Neuromuscular education;Splinting;Balance training;Energy conservation;Therapeutic activities;DME and/or AE instruction      OT Goals(Current goals can be found in the care plan section)   Acute Rehab OT Goals Patient Stated Goal: return to PLOF OT Goal Formulation: With patient/family Time For Goal Achievement: 05/26/23 Potential to Achieve Goals: Good ADL Goals Pt Will Perform Eating: with set-up;sitting;with adaptive utensils Pt Will Perform Grooming: sitting;with supervision Pt Will Perform Lower Body Dressing: sit to/from stand;sitting/lateral leans;with min assist Pt Will Transfer to Toilet: with contact guard assist;stand pivot transfer Pt Will Perform Toileting - Clothing Manipulation and hygiene: with min assist;sitting/lateral  leans;sit to/from stand;with adaptive equipment   OT Frequency:  Min 3X/week    Co-evaluation PT/OT/SLP Co-Evaluation/Treatment: Yes Reason for Co-Treatment: Complexity of the patient's impairments  (multi-system involvement);For patient/therapist safety;To address functional/ADL transfers PT goals addressed during session: Mobility/safety with mobility;Balance OT goals addressed during session: ADL's and self-care      AM-PAC OT "6 Clicks" Daily Activity     Outcome Measure Help from another person eating meals?: A Lot Help from another person taking care of personal grooming?: A Lot Help from another person toileting, which includes using toliet, bedpan, or urinal?: Total Help from another person bathing (including washing, rinsing, drying)?: A Lot Help from another person to put on and taking off regular upper body clothing?: A Lot Help from another person to put on and taking off regular lower body clothing?: A Lot 6 Click Score: 11   End of Session Equipment Utilized During Treatment: Oxygen Nurse Communication: Mobility status  Activity Tolerance: Patient tolerated treatment well Patient left: in bed;with call bell/phone within reach;with bed alarm set  OT Visit Diagnosis: Other abnormalities of gait and mobility (R26.89);Muscle weakness (generalized) (M62.81);Unsteadiness on feet (R26.81)                Time: 1610-9604 OT Time Calculation (min): 42 min Charges:  OT General Charges $OT Visit: 1 Visit OT Evaluation $OT Eval High Complexity: 1 High  Gerre Kraft, OTD OTR/L  05/12/23, 1:44 PM

## 2023-05-12 NOTE — Progress Notes (Signed)
 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  Per family at the bedside, patient fell out of a folding chair on Friday and landed on his left side. Following the fall, he reported generalized weakness, numbness and difficulties grasping objects with his hands. He reported hitting his head without LOC.   ED Course: Initial vital signs showed HR of  86 beats/minute, BP 103/88 mm Hg, the RR 16 breaths/minute, and the oxygen saturation 100% on RA and a temperature of 97.66F (36.4C).  Pertinent Labs/Diagnostics Findings: Na+/ K+:137/4.0  Glucose:103 BUN/Cr.:37/1.41 WBC: 6.4 K/L Hgb/Hct:10.6/32.5 CXR> CTH> CT Cervical, thoracic, lumbar and MRI brain, cervical as reported below with significant findings of new fracture/disc herniation at C5/6 with moderate stenosis. Findings discussed with on call Neurosurgeon who recommended admission to ICU with frequent neurochecks, pressor to keep MAP>85 and possible surgical intervention. PCCM consulted for admission.  05/11/23- patient s/p surgery today.  Lovenox  to initiate in am per nsgy. I met with patient and family at bedside. He appears stable and is in good spirits  05/12/23- patient doing well after decomrpession.  Patient reports feelings of falling and moving of med, I repositioned patient and reviewed what I could with family but patient still complains of these symptoms,  reviewed this with neurosurgeon.  He remains on neosynephrine.    Past Medical History   T2DM, CKD stage III, HTN, HLD, HFrEF with EF 25-30%, refused AICD, ischemic Cardiomyopathy, A-Fib  not on anticoagulation due to gross hematuria, CAD s/p PCI and stent, GERD, RLS and chronic back pain   Significant Hospital Events   4/20:admit with cervical spine injury following a traumatic fall  Consults:  Neurosurgery  Procedures:    Significant Diagnostic Tests:  04/20 Chest Xray> IMPRESSION: No active disease. Borderline to mild cardiomegaly.  04/20 CT Cervical Spine> IMPRESSION: 1. Negative for acute fracture or subluxation of the cervical spine. 2. 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  04/20 CT THORACIC SPINE IMPRESSION IMPRESSION: 1. Multilevel degenerative changes without evidence of acute fracture. 2. Trace bilateral pleural effusions with atelectasis. 3. Aortic atherosclerosis with coronary artery calcifications.   04/20 CT LUMBAR SPINE IMPRESSION 1. 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 2. Multilevel degenerative changes in the lumbar spine as described above. 3. Left renal calculi.  04/20 Noncontrast CT head> IMPRESSION: 1. Mild motion degradation limits the exam. No definite CT evidence for acute intracranial abnormality. 2. Atrophy and chronic small vessel ischemic changes of the white matter. Interval small chronic appearing right occipital infarct, new compared to 2021 head CT.  4/20 MRI Brain> IMPRESSION: 1. No evidence of acute intracranial abnormality. 2. Remote right PCA territory infarct.  4/20 MRI Cervical Spine> IMPRESSION: 1. 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 Edema extends posteriorly to involve the C6-C7 disc, compatible with traumatic disc injury. Small volume of adjacent  prevertebral edema. 2. At C3-C4,  severe canal stenosis with cord compression. T2 hyperintensity in the cord at this level is compatible with cord edema and/or myelomalacia. Severe bilateral foraminal stenosis at this level. 3. At C4-C5, moderate to severe canal stenosis and severe bilateral foraminal stenosis. 4. At C6-C7, mild to moderate canal stenosis and moderate bilateral foraminal stenosis.  Interim History / Subjective:      Micro Data:  4/20: MRSA PCR>>   Antimicrobials:  None  OBJECTIVE  Blood pressure 98/60, pulse 61, temperature 97.8 F (36.6 C), temperature source Oral, resp. rate 20, height 5\' 7"  (1.702 m), weight 73.6 kg, SpO2 96%.        Intake/Output Summary (Last 24 hours) at 05/12/2023 1617 Last data filed at 05/12/2023 1500 Gross per 24 hour  Intake 2342.34 ml  Output 655 ml  Net 1687.34 ml   Filed Weights   05/10/23 1542 05/10/23 2245 05/12/23 0500  Weight: 66.7 kg 68.5 kg 73.6 kg     Physical Examination  GENERAL:83  year-old critically ill patient lying in the bed in no acute distress EYES: PEERLA. No scleral icterus. Extraocular muscles intact.  HEENT: Head traumatic,head laceration. normocephalic. Oropharynx and nasopharynx clear.  NECK:  No JVD, supple  LUNGS: Normal breath sounds bilaterally.  No use of accessory muscles of respiration.  CARDIOVASCULAR: S1, S2 normal. No murmurs, rubs, or gallops.  ABDOMEN: Soft, NTND EXTREMITIES: No swelling or erythema. Grip 4/5 bilaterally. Pulses palpable distally. NEUROLOGIC: The patient is A&O X 4. No focal neurological deficit appreciated. Cranial nerves are intact.  SKIN: No obvious rash, lesion, or ulcer. Warm to touch Labs/imaging that I havepersonally reviewed  (right click and "Reselect all SmartList Selections" daily)     Labs   CBC: Recent Labs  Lab 05/10/23 1621 05/11/23 0300 05/12/23 0545  WBC 6.4 9.2 11.3*  NEUTROABS 4.5  --   --   HGB 10.6* 12.0* 10.7*  HCT 32.5* 36.3* 31.5*  MCV 98.5 96.3 96.3  PLT 210 262  243    Basic Metabolic Panel: Recent Labs  Lab 05/10/23 1621 05/11/23 0300 05/12/23 0545  NA 137 138 133*  K 4.0 3.8 3.8  CL 102 103 105  CO2 25 23 17*  GLUCOSE 103* 84 165*  BUN 37* 32* 35*  CREATININE 1.41* 1.40* 1.63*  CALCIUM  9.8 9.4 8.5*  MG  --  1.9 1.7  PHOS  --  3.2 4.6   GFR: Estimated Creatinine Clearance: 32.7 mL/min (A) (by C-G formula based on SCr of 1.63 mg/dL (H)). Recent Labs  Lab 05/10/23 1621 05/11/23 0300 05/12/23 0545  WBC 6.4 9.2 11.3*    Liver Function Tests: Recent Labs  Lab 05/10/23 1621  AST 30  ALT 22  ALKPHOS 57  BILITOT 1.0  PROT 7.1  ALBUMIN 4.0   No results for input(s): "LIPASE", "AMYLASE" in the last 168 hours. No results for input(s): "AMMONIA" in the last 168 hours.  ABG    Component Value Date/Time   TCO2 26 04/15/2008 2244     Coagulation Profile: Recent Labs  Lab 05/10/23 2309  INR 1.0    Cardiac Enzymes: Recent Labs  Lab 05/10/23 1621  CKTOTAL 292    HbA1C: Hemoglobin A1C  Date/Time Value Ref Range Status  02/02/2023 08:50 AM 7.8 (A) 4.0 - 5.6 % Final  09/15/2022 08:38 AM 7.3 (A) 4.0 - 5.6 % Final   HbA1c, POC (controlled diabetic range)  Date/Time Value Ref Range Status  01/04/2018 09:16 AM 6.4 0.0 - 7.0 %  Final   Hgb A1c MFr Bld  Date/Time Value Ref Range Status  05/06/2021 11:21 AM 6.9 (H) <5.7 % of total Hgb Final    Comment:    For someone without known diabetes, a hemoglobin A1c value of 6.5% or greater indicates that they may have  diabetes and this should be confirmed with a follow-up  test. . For someone with known diabetes, a value <7% indicates  that their diabetes is well controlled and a value  greater than or equal to 7% indicates suboptimal  control. A1c targets should be individualized based on  duration of diabetes, age, comorbid conditions, and  other considerations. . Currently, no consensus exists regarding use of hemoglobin A1c for diagnosis of diabetes for  children. Aaron Aas   09/10/2020 09:25 AM 6.4 (H) <5.7 % of total Hgb Final    Comment:    For someone without known diabetes, a hemoglobin  A1c value between 5.7% and 6.4% is consistent with prediabetes and should be confirmed with a  follow-up test. . For someone with known diabetes, a value <7% indicates that their diabetes is well controlled. A1c targets should be individualized based on duration of diabetes, age, comorbid conditions, and other considerations. . This assay result is consistent with an increased risk of diabetes. . Currently, no consensus exists regarding use of hemoglobin A1c for diagnosis of diabetes for children. .     CBG: Recent Labs  Lab 05/11/23 1956 05/12/23 0023 05/12/23 0451 05/12/23 0727 05/12/23 1133  GLUCAP 125* 142* 134* 130* 131*    Review of Systems:    10 point ROS conducted and is negative except as per HPI and subjective findings   Past Medical History  He,  has a past medical history of Arthritis, Atrial fibrillation (HCC), CHF (congestive heart failure) (HCC), CKD (chronic kidney disease), stage III (HCC), Complication of anesthesia, Diabetes mellitus without complication (HCC), GERD (gastroesophageal reflux disease), Hypertension, Hypothyroidism, and Nephrolithiasis.   Surgical History    Past Surgical History:  Procedure Laterality Date   ANTERIOR CERVICAL DECOMP/DISCECTOMY FUSION N/A 05/11/2023   Procedure: ANTERIOR CERVICAL DECOMPRESSION/DISCECTOMY FUSION 2 LEVELS;  Surgeon: Jodeen Munch, MD;  Location: [facility];  Service: Neurosurgery;  Laterality: N/A;   CATARACT EXTRACTION W/PHACO Right 05/07/2021   Procedure: CATARACT EXTRACTION PHACO AND INTRAOCULAR LENS PLACEMENT (IOC) RIGHT DIABETIC 8.63 00:56.5;  Surgeon: Clair Crews, MD;  Location: [facility];  Service: Ophthalmology;  Laterality: Right;  Diabetic   CATARACT EXTRACTION W/PHACO Left 05/21/2021   Procedure: CATARACT EXTRACTION PHACO AND INTRAOCULAR LENS  PLACEMENT (IOC) LEFT DIABETIC 7.27 01:01.5;  Surgeon: Clair Crews, MD;  Location: [facility];  Service: Ophthalmology;  Laterality: Left;  Diabetic   CORONARY STENT INTERVENTION N/A 03/08/2020   Procedure: CORONARY STENT INTERVENTION;  Surgeon: Antonette Batters, MD;  Location: [facility];  Service: Cardiovascular;  Laterality: N/A;   LEFT HEART CATH AND CORONARY ANGIOGRAPHY Left 03/08/2020   Procedure: LEFT HEART CATH AND CORONARY ANGIOGRAPHY;  Surgeon: Antonette Batters, MD;  Location: [facility];  Service: Cardiovascular;  Laterality: Left;   NASAL FRACTURE SURGERY  1994   URETEROLITHOTOMY Right 2015     Social History   reports that he has never smoked. He has never used smokeless tobacco. He reports that he does not drink alcohol and does not use drugs.   Family History   His family history includes Heart disease in his father.   Allergies Allergies  Allergen Reactions   Codeine Rash and Other (  See Comments)    Per pt "hard on my kidneys"     Lovastatin Rash and Other (See Comments)   Tape Rash     Home Medications  Prior to Admission medications   Medication Sig Start Date End Date Taking? Authorizing Provider  acetaminophen  (TYLENOL ) 650 MG CR tablet Take 650 mg by mouth every 8 (eight) hours as needed for pain.   Yes [provider]  atorvastatin  (LIPITOR) 10 MG tablet Take 1 tablet (10 mg total) by mouth daily at 6 PM. 02/02/23  Yes Sowles, Krichna, MD  carvedilol  (COREG ) 3.125 MG tablet Take 1 tablet (3.125 mg total) by mouth 2 (two) times daily with a meal. 12/25/19 05/10/23 Yes Magdalene School, MD  clopidogrel  (PLAVIX ) 75 MG tablet Take 75 mg by mouth daily. 03/04/23  Yes [provider]  furosemide  (LASIX ) 40 MG tablet Take 40 mg by mouth daily. 05/03/23  Yes [provider]  glipiZIDE  2.5 MG TABS Take 1 tablet by mouth daily with breakfast. 02/02/23  Yes Sowles, Krichna, MD  levothyroxine  (SYNTHROID ) 25  MCG tablet TAKE 1 TABLET (25 MCG TOTAL) BY MOUTH DAILY BEFORE BREAKFAST. ON SUNDAYS, TAKE 2 TABLETS BY MOUTH BEFORE BREAKFAST. 04/27/23  Yes Sowles, Krichna, MD  losartan  (COZAAR ) 50 MG tablet Take 50 mg by mouth daily. 05/03/23  Yes [provider]  magnesium  oxide (MAG-OX) 400 MG tablet Take 400 mg by mouth daily.   Yes [provider]  metaxalone (SKELAXIN) 800 MG tablet May take 1/2-1 whole tablet up to 2 times daily as needed for pain/spasm. 04/14/22  Yes [provider]  metFORMIN  (GLUCOPHAGE -XR) 750 MG 24 hr tablet Take 2 tablets (1,500 mg total) by mouth daily with breakfast. 02/02/23  Yes Sowles, Krichna, MD  pantoprazole  (PROTONIX ) 40 MG tablet Take 1 tablet (40 mg total) by mouth daily. 11/10/22 11/05/23 Yes Brigitte Canard, PA-C  rOPINIRole  (REQUIP ) 1 MG tablet Take 1 tablet (1 mg total) by mouth 2 (two) times daily. 02/02/23  Yes Sowles, Krichna, MD  spironolactone (ALDACTONE) 25 MG tablet Take 12.5 mg by mouth daily.   Yes Callwood, Dwayne D, MD  triamcinolone  (KENALOG ) 0.1 % Apply 1 application topically 2 (two) times daily as needed (rash).   Yes [provider]  aluminum hydroxide-magnesium  carbonate (GAVISCON) 95-358 MG/15ML SUSP Take by mouth.    [provider]  blood glucose meter kit and supplies 1 each by Other route as directed. Dispense based on patient and insurance preference. Use up to four times daily as directed. (FOR ICD-10 E10.9, E11.9). 09/09/21   Sowles, Krichna, MD  Cholecalciferol (VITAMIN D -1000 MAX ST) 25 MCG (1000 UT) tablet Take 1 tablet by mouth daily.    [provider]  Cyanocobalamin (B-12) 1000 MCG TBCR Take 1,000 mcg by mouth every other day. 01/02/20   [provider]  dapagliflozin  propanediol (FARXIGA ) 10 MG TABS tablet Take by mouth. 01/10/22   [provider]  docusate sodium  (COLACE) 50 MG capsule Take 100 mg by mouth 2 (two) times daily as needed for mild constipation.    [provider]  fexofenadine-pseudoephedrine (ALLEGRA-D 24) 180-240 MG 24 hr tablet Take 1 tablet by mouth daily as needed (allergies).    [provider]  furosemide  (LASIX ) 20 MG tablet Take 40 mg by mouth daily. Patient not taking: Reported on 05/10/2023 01/02/20   Callwood, Creig Doe D, MD  glucose blood (ONETOUCH ULTRA) test strip USE AS DIRECTED TO CHECK BLOOD GLUCOSE ONCE DAILY. 05/12/22   Sowles,  Krichna, MD  Lancets [facility] DELICA PLUS [place]) MISC CHECK BLOOD SUGAR AT NOON 03/02/23   Sowles, Krichna, MD  losartan  (COZAAR ) 25 MG tablet Take 1 tablet (25 mg total) by mouth at bedtime. Patient not taking: Reported on 05/10/2023 12/25/19   Chatterjee, Srobona Tublu, MD  Nutritional Supplements (THERALITH XR PO) Take by mouth QID. 3.752-45-45-49.5mg . Take 2 in the evening and 2 at bedtime.    [provider]  polyethylene glycol powder (GLYCOLAX /MIRALAX ) 17 GM/SCOOP powder Take 17 g by mouth daily. 11/10/22   Brigitte Canard, PA-C  tamsulosin  (FLOMAX ) 0.4 MG CAPS capsule Take 1 capsule (0.4 mg total) by mouth every evening. 09/08/22   Geraline Knapp, MD        Assessment & Plan:  #Cervical Spine Injury in the setting of traumatic fall MRI Cervical spine shows chronic compression and stenosis at C3/4 and a new fracture/disc herniation Ligamentous injury with edema at C5/6 with moderate stenosis  -maintain C-Spine precaution -Start pressors for MAP augmentation goal >85 mm- currently on neo -Frequent neuro checks -PRN Pain management -Keep NPO -Hold anticoagulation -Neurosurgery consult- s/p surgery   #Chronic HFrEF/Ischemic cardiomyopathy:  EF 40-45%  #Chronic A-Fib not on anticoagulation due to gross hematuria Hx:CAD s/p PCI and stent, HTN, HLD No vascular congestion on CXR yesterday. Appears euvolemic -Hold ? blockers /GDMT -will hold off on acei/arb/arni at this time to augment BP  -Continue Atorvastatin  once able to take po  #AKI on CKD Stage III -Monitor  I&O's / urinary output -Follow BMP -Ensure adequate renal perfusion -Avoid nephrotoxic agents as able -Replace electrolytes as indicated   #Diabetes Mellitus -check hemoglobin A1c -CBGs q4 -Sliding scale insulin  -Follow ICU hyper/hypoglycemia protocol -Hold home Metformin  & Glipizide    #Hypothyroidism -Continue home synthroid   #RLS -Continue Requip   Best practice:  Diet:  NPO Pain/Anxiety/Delirium protocol (if indicated): No VAP protocol (if indicated): Not indicated DVT prophylaxis: Contraindicated GI prophylaxis: PPI Glucose control:  SSI Yes Central venous access:  N/A Arterial line:  N/A Foley:  N/A Mobility:  bed rest  PT consulted: N/A Last date of multidisciplinary goals of care discussion [updated family at the bedside] Code Status:  full code Disposition: ICU   = Goals of Care = Code Status Order: full  Primary Emergency ContactRian, Busche, Home Phone: [phone] Patient and family wishes to pursue full aggressive treatment and intervention options, including CPR and intubation  Critical care provider statement:   Total critical care time: 33 minutes   Performed by: Jaclynn Mast MD   Critical care time was exclusive of separately billable procedures and treating other patients.   Critical care was necessary to treat or prevent imminent or life-threatening deterioration.   Critical care was time spent personally by me on the following activities: development of treatment plan with patient and/or surrogate as well as nursing, discussions with consultants, evaluation of patient's response to treatment, examination of patient, obtaining history from patient or surrogate, ordering and performing treatments and interventions, ordering and review of laboratory studies, ordering and review of radiographic studies, pulse oximetry and re-evaluation of patient's condition.    Kellyn Mansfield, M.D.  Pulmonary & Critical Care Medicine

## 2023-05-12 NOTE — Evaluation (Signed)
 Physical Therapy Evaluation Patient Details Name: Billy Franco MRN: [id number] DOB: 29-Jan-1940 Today's Date: 05/12/2023  History of Present Illness  83 y/o male presented to ED on 05/10/23 after falling out of chair on 4/18 with progressive weakness. 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 S/p ACDF C3/4 and C4/5 on 4/21. PMH: CAD s/p stent, CHF, ischemic cardiomyopathy, Afib, T2DM, CKD, HTN  Clinical Impression  Patient admitted with the above. PTA, patient lives with wife and was independent and enjoyed mowing and being active. Patient currently presents with global weakness, impaired sensation, impaired balance, and decreased activity tolerance. Required maxA+2 for log roll technique for bed mobility with patient becoming anxious with mobility due to fear of falling. Required mod-maxA for sitting balance on EOB with feet supported with brief moments of CGA-minA with max cueing to utilize abdominal muscles to pull self forward. Patient is motivated but fearful of falling. Patient will benefit from skilled PT services during acute stay to address listed deficits. Patient will benefit from ongoing therapy at discharge to maximize functional independence and safety.       If plan is discharge home, recommend the following: Two people to help with walking and/or transfers;Two people to help with bathing/dressing/bathroom;Assistance with cooking/housework;Assist for transportation;Help with stairs or ramp for entrance;Assistance with feeding   Can travel by private vehicle        Equipment Recommendations [place] lift;Wheelchair cushion (measurements PT);Wheelchair (measurements PT);Hospital bed  Recommendations for Other Services  Rehab consult    Functional Status Assessment Patient has had a recent decline in their functional status and demonstrates the ability to make significant improvements in  function in a reasonable and predictable amount of time.     Precautions / Restrictions Precautions Precautions: Fall;Cervical Precaution Booklet Issued: No Recall of Precautions/Restrictions: Intact Required Braces or Orthoses: Cervical Brace Cervical Brace: Hard collar;Other (comment) (for OOB) Restrictions Weight Bearing Restrictions Per Provider Order: No      Mobility  Bed Mobility Overal bed mobility: Needs Assistance Bed Mobility: Rolling, Sidelying to Sit, Sit to Sidelying Rolling: Max assist, +2 for physical assistance, +2 for safety/equipment Sidelying to sit: Max assist, +2 for physical assistance, +2 for safety/equipment     Sit to sidelying: Max assist, +2 for physical assistance, +2 for safety/equipment General bed mobility comments: instructed patient on log roll technique to protect spine. Patient anxious and at times resisting due to fear of falling and requires frequent encouragement    Transfers                   General transfer comment: deferred due to poor sitting balance this date    Ambulation/Gait                  Stairs            Wheelchair Mobility     Tilt Bed    Modified Rankin (Stroke Patients Only)       Balance Overall balance assessment: Needs assistance Sitting-balance support: No upper extremity supported, Feet supported Sitting balance-Leahy Scale: Poor Sitting balance - Comments: requires mod-maxA for sitting balance. Brief moments of CGA with patient performing anterior lean Postural control: Posterior lean                                   Pertinent Vitals/Pain Pain Assessment Pain Assessment: 0-10  Pain Score: 10-Worst pain ever Pain Location: neck Pain Descriptors / Indicators: Sore, Grimacing Pain Intervention(s): Limited activity within patient's tolerance, Monitored during session, Repositioned    Home Living Family/patient expects to be discharged to:: Private  residence Living Arrangements: Spouse/significant other Available Help at Discharge: Available 24 hours/day;Family Type of Home: House Home Access: Stairs to enter Entrance Stairs-Rails: Left Entrance Stairs-Number of Steps: 1 + 1/2 step   Home Layout: One level Home Equipment: BSC/3in1      Prior Function Prior Level of Function : Independent/Modified Independent;Driving;History of Falls (last six months)             Mobility Comments: amb with no AD; two falls ADLs Comments: very indep including weeding, mowing, driving     Extremity/Trunk Assessment   Upper Extremity Assessment Upper Extremity Assessment: Right hand dominant;RUE deficits/detail;LUE deficits/detail RUE Deficits / Details: limited due to a-line- but grossly pt with limitations in AROM shoulder flexion ( to approx 90 degrees), able to open/close hands approx 1/4 full range of motion RUE Sensation: WNL RUE Coordination: decreased fine motor;decreased gross motor LUE Deficits / Details: AROM: shoulder flexion approx to 1/2 full AROM, elbow flexion/extension approx 3/4 full AROM, wrist 1/4 full AROM, open/close hands approx 1/4 full AROM; PROM grossly WFL; +tenodesis LUE Sensation: WNL    Lower Extremity Assessment Lower Extremity Assessment: Defer to PT evaluation RLE Deficits / Details: grossly 2-/5 overall RLE Sensation: decreased light touch;decreased proprioception LLE Deficits / Details: grossly 2-/5 overall LLE Sensation: decreased light touch;decreased proprioception    Cervical / Trunk Assessment Cervical / Trunk Assessment: Neck Surgery  Communication   Communication Communication: No apparent difficulties Factors Affecting Communication: Reduced clarity of speech    Cognition Arousal: Alert Behavior During Therapy: Anxious   PT - Cognitive impairments: No apparent impairments                         Following commands: Intact (although easily distracted)       Cueing  Cueing Techniques: Verbal cues, Tactile cues     General Comments General comments (skin integrity, edema, etc.): VSS on 2L with MAP 80-85 throughout mobility. Educated family and patient on role of PT within hospital setting and discharge recommendations.    Exercises     Assessment/Plan    PT Assessment Patient needs continued PT services  PT Problem List Decreased strength;Decreased activity tolerance;Decreased mobility;Decreased coordination;Decreased balance;Decreased knowledge of use of DME;Decreased safety awareness;Decreased knowledge of precautions;Cardiopulmonary status limiting activity;Impaired sensation;Pain       PT Treatment Interventions DME instruction;Gait training;Functional mobility training;Therapeutic activities;Therapeutic exercise;Balance training;Patient/family education;Neuromuscular re-education    PT Goals (Current goals can be found in the Care Plan section)  Acute Rehab PT Goals Patient Stated Goal: to be able to walk PT Goal Formulation: With patient/family Time For Goal Achievement: 05/26/23 Potential to Achieve Goals: Good    Frequency Min 3X/week     Co-evaluation PT/OT/SLP Co-Evaluation/Treatment: Yes Reason for Co-Treatment: Complexity of the patient's impairments (multi-system involvement);For patient/therapist safety;To address functional/ADL transfers PT goals addressed during session: Mobility/safety with mobility;Balance OT goals addressed during session: ADL's and self-care       AM-PAC PT "6 Clicks" Mobility  Outcome Measure Help needed turning from your back to your side while in a flat bed without using bedrails?: Total Help needed moving from lying on your back to sitting on the side of a flat bed without using bedrails?: Total Help needed moving to and from a bed to  a chair (including a wheelchair)?: Total Help needed standing up from a chair using your arms (e.g., wheelchair or bedside chair)?: Total Help needed to walk in  hospital room?: Total Help needed climbing 3-5 steps with a railing? : Total 6 Click Score: 6    End of Session Equipment Utilized During Treatment: Oxygen Activity Tolerance: Patient tolerated treatment well Patient left: in bed;with bed alarm set;with call bell/phone within reach;with family/visitor present Nurse Communication: Mobility status PT Visit Diagnosis: Unsteadiness on feet (R26.81);Muscle weakness (generalized) (M62.81);Other abnormalities of gait and mobility (R26.89)    Time: 1610-9604 PT Time Calculation (min) (ACUTE ONLY): 42 min   Charges:   PT Evaluation $PT Eval High Complexity: 1 High PT Treatments $Therapeutic Activity: 8-22 mins PT General Charges $$ ACUTE PT VISIT: 1 Visit         Janine Melbourne, PT, DPT Physical Therapist - [facility]  [facility]   Tran Randle A Shyne Lehrke 05/12/2023, 1:39 PM

## 2023-05-13 ENCOUNTER — Inpatient Hospital Stay

## 2023-05-13 DIAGNOSIS — R6510 Systemic inflammatory response syndrome (SIRS) of non-infectious origin without acute organ dysfunction: Secondary | ICD-10-CM | POA: Diagnosis not present

## 2023-05-13 DIAGNOSIS — I480 Paroxysmal atrial fibrillation: Secondary | ICD-10-CM

## 2023-05-13 DIAGNOSIS — J9601 Acute respiratory failure with hypoxia: Secondary | ICD-10-CM

## 2023-05-13 DIAGNOSIS — E871 Hypo-osmolality and hyponatremia: Secondary | ICD-10-CM

## 2023-05-13 DIAGNOSIS — N183 Chronic kidney disease, stage 3 unspecified: Secondary | ICD-10-CM

## 2023-05-13 DIAGNOSIS — G9520 Unspecified cord compression: Secondary | ICD-10-CM | POA: Diagnosis not present

## 2023-05-13 DIAGNOSIS — I5032 Chronic diastolic (congestive) heart failure: Secondary | ICD-10-CM

## 2023-05-13 DIAGNOSIS — E1165 Type 2 diabetes mellitus with hyperglycemia: Secondary | ICD-10-CM

## 2023-05-13 DIAGNOSIS — N179 Acute kidney failure, unspecified: Secondary | ICD-10-CM

## 2023-05-13 LAB — GLUCOSE, CAPILLARY
Glucose-Capillary: 115 mg/dL — ABNORMAL HIGH (ref 70–99)
Glucose-Capillary: 120 mg/dL — ABNORMAL HIGH (ref 70–99)
Glucose-Capillary: 142 mg/dL — ABNORMAL HIGH (ref 70–99)
Glucose-Capillary: 155 mg/dL — ABNORMAL HIGH (ref 70–99)
Glucose-Capillary: 217 mg/dL — ABNORMAL HIGH (ref 70–99)
Glucose-Capillary: 289 mg/dL — ABNORMAL HIGH (ref 70–99)

## 2023-05-13 LAB — URINALYSIS, COMPLETE (UACMP) WITH MICROSCOPIC
Bacteria, UA: NONE SEEN
Bilirubin Urine: NEGATIVE
Glucose, UA: NEGATIVE mg/dL
Ketones, ur: NEGATIVE mg/dL
Leukocytes,Ua: NEGATIVE
Nitrite: NEGATIVE
Protein, ur: NEGATIVE mg/dL
Specific Gravity, Urine: 1.012 (ref 1.005–1.030)
Squamous Epithelial / HPF: 0 /HPF (ref 0–5)
pH: 6 (ref 5.0–8.0)

## 2023-05-13 LAB — CBC
HCT: 33.5 % — ABNORMAL LOW (ref 39.0–52.0)
HCT: 32.3 % — ABNORMAL LOW (ref 39.0–52.0)
Hemoglobin: 11 g/dL — ABNORMAL LOW (ref 13.0–17.0)
Hemoglobin: 10.2 g/dL — ABNORMAL LOW (ref 13.0–17.0)
MCH: 32.4 pg (ref 26.0–34.0)
MCH: 32 pg (ref 26.0–34.0)
MCHC: 32.8 g/dL (ref 30.0–36.0)
MCHC: 31.6 g/dL (ref 30.0–36.0)
MCV: 98.5 fL (ref 80.0–100.0)
MCV: 101.3 fL — ABNORMAL HIGH (ref 80.0–100.0)
Platelets: 247 10*3/uL (ref 150–400)
Platelets: 212 10*3/uL (ref 150–400)
RBC: 3.4 MIL/uL — ABNORMAL LOW (ref 4.22–5.81)
RBC: 3.19 MIL/uL — ABNORMAL LOW (ref 4.22–5.81)
RDW: 13.2 % (ref 11.5–15.5)
RDW: 13.2 % (ref 11.5–15.5)
WBC: 15.2 10*3/uL — ABNORMAL HIGH (ref 4.0–10.5)
WBC: 11.6 10*3/uL — ABNORMAL HIGH (ref 4.0–10.5)
nRBC: 0 % (ref 0.0–0.2)
nRBC: 0 % (ref 0.0–0.2)

## 2023-05-13 LAB — COMPREHENSIVE METABOLIC PANEL WITH GFR
ALT: 13 U/L (ref 0–44)
AST: 40 U/L (ref 15–41)
Albumin: 2.8 g/dL — ABNORMAL LOW (ref 3.5–5.0)
Alkaline Phosphatase: 49 U/L (ref 38–126)
Anion gap: 11 (ref 5–15)
BUN: 44 mg/dL — ABNORMAL HIGH (ref 8–23)
CO2: 17 mmol/L — ABNORMAL LOW (ref 22–32)
Calcium: 7.9 mg/dL — ABNORMAL LOW (ref 8.9–10.3)
Chloride: 103 mmol/L (ref 98–111)
Creatinine, Ser: 2.6 mg/dL — ABNORMAL HIGH (ref 0.61–1.24)
GFR, Estimated: 24 mL/min — ABNORMAL LOW (ref >60)
Glucose, Bld: 317 mg/dL — ABNORMAL HIGH (ref 70–99)
Potassium: 4 mmol/L (ref 3.5–5.1)
Sodium: 131 mmol/L — ABNORMAL LOW (ref 135–145)
Total Bilirubin: 1 mg/dL (ref 0.0–1.2)
Total Protein: 5.8 g/dL — ABNORMAL LOW (ref 6.5–8.1)

## 2023-05-13 LAB — BPAM PLATELET PHERESIS
Blood Product Expiration Date: 202504232359
Blood Product Expiration Date: 202504242359
ISSUE DATE / TIME: 202504211100
Unit Type and Rh: 5100
Unit Type and Rh: 7300

## 2023-05-13 LAB — BASIC METABOLIC PANEL WITH GFR
Anion gap: 10 (ref 5–15)
BUN: 44 mg/dL — ABNORMAL HIGH (ref 8–23)
CO2: 17 mmol/L — ABNORMAL LOW (ref 22–32)
Calcium: 8.4 mg/dL — ABNORMAL LOW (ref 8.9–10.3)
Chloride: 107 mmol/L (ref 98–111)
Creatinine, Ser: 2.44 mg/dL — ABNORMAL HIGH (ref 0.61–1.24)
GFR, Estimated: 26 mL/min — ABNORMAL LOW (ref >60)
Glucose, Bld: 125 mg/dL — ABNORMAL HIGH (ref 70–99)
Potassium: 3.9 mmol/L (ref 3.5–5.1)
Sodium: 134 mmol/L — ABNORMAL LOW (ref 135–145)

## 2023-05-13 LAB — PROCALCITONIN: Procalcitonin: 0.5 ng/mL

## 2023-05-13 LAB — BLOOD GAS, ARTERIAL
Acid-base deficit: 7.5 mmol/L — ABNORMAL HIGH (ref 0.0–2.0)
Bicarbonate: 17.4 mmol/L — ABNORMAL LOW (ref 20.0–28.0)
FIO2: 50 %
MECHVT: 500 mL
Mechanical Rate: 18
O2 Saturation: 99.2 %
PEEP: 5 cmH2O
Patient temperature: 37
pCO2 arterial: 33 mmHg (ref 32–48)
pH, Arterial: 7.33 — ABNORMAL LOW (ref 7.35–7.45)
pO2, Arterial: 127 mmHg — ABNORMAL HIGH (ref 83–108)

## 2023-05-13 LAB — PREPARE PLATELET PHERESIS
Unit division: 0
Unit division: 0

## 2023-05-13 LAB — MAGNESIUM
Magnesium: 2.1 mg/dL (ref 1.7–2.4)
Magnesium: 2.1 mg/dL (ref 1.7–2.4)

## 2023-05-13 LAB — TROPONIN I (HIGH SENSITIVITY)
Troponin I (High Sensitivity): 356 ng/L (ref <18)
Troponin I (High Sensitivity): 339 ng/L (ref <18)

## 2023-05-13 LAB — LACTIC ACID, PLASMA
Lactic Acid, Venous: 1 mmol/L (ref 0.5–1.9)
Lactic Acid, Venous: 0.9 mmol/L (ref 0.5–1.9)

## 2023-05-13 LAB — PHOSPHORUS: Phosphorus: 4.6 mg/dL (ref 2.5–4.6)

## 2023-05-13 MED ORDER — FENTANYL CITRATE PF 50 MCG/ML IJ SOSY
50.0000 ug | PREFILLED_SYRINGE | Freq: Once | INTRAMUSCULAR | Status: AC
  Administered 2023-05-13: 50 ug via INTRAVENOUS

## 2023-05-13 MED ORDER — FLUMAZENIL 0.5 MG/5ML IV SOLN
0.5000 mg | Freq: Once | INTRAVENOUS | Status: AC
  Administered 2023-05-13: 0.5 mg via INTRAVENOUS

## 2023-05-13 MED ORDER — FENTANYL CITRATE PF 50 MCG/ML IJ SOSY: 25.0000 ug | PREFILLED_SYRINGE | INTRAMUSCULAR | Status: DC | PRN

## 2023-05-13 MED ORDER — NOREPINEPHRINE 4 MG/250ML-% IV SOLN
0.0000 ug/min | INTRAVENOUS | Status: DC
  Administered 2023-05-14: 14 ug/min via INTRAVENOUS
  Administered 2023-05-14: 2 ug/min via INTRAVENOUS
  Filled 2023-05-13 (×2): qty 250

## 2023-05-13 MED ORDER — ROCURONIUM BROMIDE 10 MG/ML (PF) SYRINGE
PREFILLED_SYRINGE | INTRAVENOUS | Status: AC
  Filled 2023-05-13: qty 10

## 2023-05-13 MED ORDER — DEXAMETHASONE SODIUM PHOSPHATE 10 MG/ML IJ SOLN
10.0000 mg | Freq: Once | INTRAMUSCULAR | Status: AC
  Administered 2023-05-13: 10 mg via INTRAVENOUS
  Filled 2023-05-13: qty 1

## 2023-05-13 MED ORDER — ROCURONIUM BROMIDE 10 MG/ML (PF) SYRINGE
50.0000 mg | PREFILLED_SYRINGE | Freq: Once | INTRAVENOUS | Status: AC
  Administered 2023-05-13: 50 mg via INTRAVENOUS

## 2023-05-13 MED ORDER — LEVOTHYROXINE SODIUM 50 MCG PO TABS: 25.0000 ug | ORAL_TABLET | ORAL | Status: DC

## 2023-05-13 MED ORDER — CARVEDILOL 3.125 MG PO TABS: 3.1250 mg | ORAL_TABLET | Freq: Two times a day (BID) | ORAL | Status: DC

## 2023-05-13 MED ORDER — ENOXAPARIN SODIUM 30 MG/0.3ML IJ SOSY
30.0000 mg | PREFILLED_SYRINGE | INTRAMUSCULAR | Status: DC
  Administered 2023-05-14: 30 mg via SUBCUTANEOUS
  Filled 2023-05-13: qty 0.3

## 2023-05-13 MED ORDER — DEXAMETHASONE SODIUM PHOSPHATE 4 MG/ML IJ SOLN
4.0000 mg | Freq: Four times a day (QID) | INTRAMUSCULAR | Status: DC
  Administered 2023-05-13 – 2023-05-15 (×8): 4 mg via INTRAVENOUS
  Filled 2023-05-13 (×8): qty 1

## 2023-05-13 MED ORDER — ATROPINE SULFATE 1 MG/10ML IJ SOSY: 1.0000 mg | PREFILLED_SYRINGE | INTRAMUSCULAR | Status: DC | PRN

## 2023-05-13 MED ORDER — ORAL CARE MOUTH RINSE
15.0000 mL | OROMUCOSAL | Status: DC
  Administered 2023-05-14 – 2023-05-16 (×33): 15 mL via OROMUCOSAL

## 2023-05-13 MED ORDER — ACETAMINOPHEN 500 MG PO TABS
1000.0000 mg | ORAL_TABLET | Freq: Four times a day (QID) | ORAL | Status: DC
  Administered 2023-05-16: 1000 mg

## 2023-05-13 MED ORDER — LACTATED RINGERS IV BOLUS: 1000.0000 mL | Freq: Once | INTRAVENOUS | Status: DC

## 2023-05-13 MED ORDER — DIAZEPAM 5 MG/ML IJ SOLN
INTRAMUSCULAR | Status: AC
  Filled 2023-05-13: qty 2

## 2023-05-13 MED ORDER — DEXMEDETOMIDINE HCL IN NACL 400 MCG/100ML IV SOLN
0.0000 ug/kg/h | INTRAVENOUS | Status: DC
  Administered 2023-05-13: 0.4 ug/kg/h via INTRAVENOUS
  Filled 2023-05-13 (×2): qty 100

## 2023-05-13 MED ORDER — INSULIN ASPART 100 UNIT/ML IJ SOLN
0.0000 [IU] | INTRAMUSCULAR | Status: DC
  Administered 2023-05-13: 7 [IU] via SUBCUTANEOUS
  Administered 2023-05-14: 4 [IU] via SUBCUTANEOUS
  Administered 2023-05-14: 11 [IU] via SUBCUTANEOUS
  Administered 2023-05-14: 7 [IU] via SUBCUTANEOUS
  Administered 2023-05-14: 11 [IU] via SUBCUTANEOUS
  Administered 2023-05-14: 3 [IU] via SUBCUTANEOUS
  Administered 2023-05-14: 4 [IU] via SUBCUTANEOUS
  Administered 2023-05-15 – 2023-05-17 (×9): 3 [IU] via SUBCUTANEOUS
  Administered 2023-05-17: 4 [IU] via SUBCUTANEOUS
  Filled 2023-05-13 (×17): qty 1

## 2023-05-13 MED ORDER — NOREPINEPHRINE 4 MG/250ML-% IV SOLN
INTRAVENOUS | Status: AC
  Administered 2023-05-13: 4 mg
  Filled 2023-05-13: qty 250

## 2023-05-13 MED ORDER — FENTANYL CITRATE PF 50 MCG/ML IJ SOSY
25.0000 ug | PREFILLED_SYRINGE | INTRAMUSCULAR | Status: DC | PRN
  Administered 2023-05-13 – 2023-05-15 (×8): 50 ug via INTRAVENOUS
  Filled 2023-05-13 (×8): qty 1

## 2023-05-13 MED ORDER — SODIUM BICARBONATE 8.4 % IV SOLN
INTRAVENOUS | Status: DC
  Filled 2023-05-13: qty 150

## 2023-05-13 MED ORDER — POLYETHYLENE GLYCOL 3350 17 G PO PACK
17.0000 g | PACK | Freq: Every day | ORAL | Status: DC
  Filled 2023-05-13: qty 1

## 2023-05-13 MED ORDER — LACTATED RINGERS IV BOLUS: 500.0000 mL | Freq: Once | INTRAVENOUS | Status: DC

## 2023-05-13 MED ORDER — PIPERACILLIN-TAZOBACTAM 3.375 G IVPB
3.3750 g | Freq: Three times a day (TID) | INTRAVENOUS | Status: AC
Start: 2023-05-13 — End: 2023-05-18
  Administered 2023-05-13 – 2023-05-18 (×16): 3.375 g via INTRAVENOUS
  Filled 2023-05-13 (×16): qty 50

## 2023-05-13 MED ORDER — FLUMAZENIL 0.5 MG/5ML IV SOLN
INTRAVENOUS | Status: AC
  Administered 2023-05-13: 0.5 mg
  Filled 2023-05-13: qty 5

## 2023-05-13 MED ORDER — MIDAZOLAM HCL 2 MG/2ML IJ SOLN
2.0000 mg | Freq: Once | INTRAMUSCULAR | Status: AC
  Filled 2023-05-13: qty 2

## 2023-05-13 MED ORDER — MIDAZOLAM HCL 2 MG/2ML IJ SOLN
INTRAMUSCULAR | Status: AC
  Administered 2023-05-13: 2 mg
  Filled 2023-05-13: qty 2

## 2023-05-13 MED ORDER — PROPOFOL 1000 MG/100ML IV EMUL
0.0000 ug/kg/min | INTRAVENOUS | Status: DC
  Administered 2023-05-13: 5 ug/kg/min via INTRAVENOUS
  Administered 2023-05-14 (×3): 15 ug/kg/min via INTRAVENOUS
  Filled 2023-05-13 (×4): qty 100

## 2023-05-13 MED ORDER — SODIUM CHLORIDE 0.9% FLUSH: 10.0000 mL | INTRAVENOUS | Status: DC | PRN

## 2023-05-13 MED ORDER — SODIUM CHLORIDE 0.9% FLUSH
10.0000 mL | Freq: Two times a day (BID) | INTRAVENOUS | Status: DC
Start: 2023-05-13 — End: 2023-05-22
  Administered 2023-05-13: 20 mL
  Administered 2023-05-13 – 2023-05-22 (×16): 10 mL

## 2023-05-13 MED ORDER — MIDAZOLAM HCL 2 MG/2ML IJ SOLN
2.0000 mg | Freq: Once | INTRAMUSCULAR | Status: AC
  Administered 2023-05-13: 2 mg via INTRAVENOUS

## 2023-05-13 MED ORDER — NOREPINEPHRINE 4 MG/250ML-% IV SOLN
INTRAVENOUS | Status: AC
  Administered 2023-05-13: 14 ug/min via INTRAVENOUS
  Filled 2023-05-13: qty 250

## 2023-05-13 MED ORDER — DIAZEPAM 5 MG/ML IJ SOLN
2.5000 mg | Freq: Once | INTRAMUSCULAR | Status: AC
  Administered 2023-05-13: 2.5 mg via INTRAVENOUS
  Filled 2023-05-13: qty 2

## 2023-05-13 MED ORDER — ASPIRIN 81 MG PO TBEC
81.0000 mg | DELAYED_RELEASE_TABLET | Freq: Every day | ORAL | Status: DC
  Administered 2023-05-13 – 2023-05-22 (×7): 81 mg via ORAL
  Filled 2023-05-13 (×8): qty 1

## 2023-05-13 MED ORDER — DOCUSATE SODIUM 50 MG/5ML PO LIQD
100.0000 mg | Freq: Two times a day (BID) | ORAL | Status: DC
Start: 2023-05-13 — End: 2023-05-16
  Filled 2023-05-13: qty 10

## 2023-05-13 MED ORDER — ATORVASTATIN CALCIUM 20 MG PO TABS
10.0000 mg | ORAL_TABLET | Freq: Every day | ORAL | Status: DC
  Administered 2023-05-16: 10 mg

## 2023-05-13 MED ORDER — LEVOTHYROXINE SODIUM 50 MCG PO TABS: 50.0000 ug | ORAL_TABLET | ORAL | Status: DC

## 2023-05-13 MED ORDER — ROPINIROLE HCL 1 MG PO TABS
1.0000 mg | ORAL_TABLET | Freq: Two times a day (BID) | ORAL | Status: DC
Start: 2023-05-13 — End: 2023-05-16
  Filled 2023-05-13 (×7): qty 1

## 2023-05-13 NOTE — Plan of Care (Signed)
 Problem: Education: Goal: Ability to describe self-care measures that may prevent or decrease complications (Diabetes Survival Skills Education) will improve Outcome: Not Progressing Goal: Individualized Educational Video(s) Outcome: Not Progressing   Problem: Coping: Goal: Ability to adjust to condition or change in health will improve Outcome: Not Progressing   Problem: Fluid Volume: Goal: Ability to maintain a balanced intake and output will improve Outcome: Not Progressing   Problem: Health Behavior/Discharge Planning: Goal: Ability to identify and utilize available resources and services will improve Outcome: Not Progressing Goal: Ability to manage health-related needs will improve Outcome: Not Progressing   Problem: Metabolic: Goal: Ability to maintain appropriate glucose levels will improve Outcome: Not Progressing   Problem: Nutritional: Goal: Maintenance of adequate nutrition will improve Outcome: Not Progressing Goal: Progress toward achieving an optimal weight will improve Outcome: Not Progressing   Problem: Skin Integrity: Goal: Risk for impaired skin integrity will decrease Outcome: Not Progressing   Problem: Tissue Perfusion: Goal: Adequacy of tissue perfusion will improve Outcome: Not Progressing   Problem: Education: Goal: Knowledge of General Education information will improve Description: Including pain rating scale, medication(s)/side effects and non-pharmacologic comfort measures Outcome: Not Progressing   Problem: Health Behavior/Discharge Planning: Goal: Ability to manage health-related needs will improve Outcome: Not Progressing   Problem: Clinical Measurements: Goal: Ability to maintain clinical measurements within normal limits will improve Outcome: Not Progressing Goal: Will remain free from infection Outcome: Not Progressing Goal: Diagnostic test results will improve Outcome: Not Progressing Goal: Respiratory complications will  improve Outcome: Not Progressing Goal: Cardiovascular complication will be avoided Outcome: Not Progressing   Problem: Activity: Goal: Risk for activity intolerance will decrease Outcome: Not Progressing   Problem: Nutrition: Goal: Adequate nutrition will be maintained Outcome: Not Progressing   Problem: Coping: Goal: Level of anxiety will decrease Outcome: Not Progressing   Problem: Elimination: Goal: Will not experience complications related to bowel motility Outcome: Not Progressing Goal: Will not experience complications related to urinary retention Outcome: Not Progressing   Problem: Pain Managment: Goal: General experience of comfort will improve and/or be controlled Outcome: Not Progressing   Problem: Safety: Goal: Ability to remain free from injury will improve Outcome: Not Progressing   Problem: Skin Integrity: Goal: Risk for impaired skin integrity will decrease Outcome: Not Progressing   Problem: Education: Goal: Ability to verbalize activity precautions or restrictions will improve Outcome: Not Progressing Goal: Knowledge of the prescribed therapeutic regimen will improve Outcome: Not Progressing Goal: Understanding of discharge needs will improve Outcome: Not Progressing   Problem: Activity: Goal: Ability to avoid complications of mobility impairment will improve Outcome: Not Progressing Goal: Ability to tolerate increased activity will improve Outcome: Not Progressing Goal: Will remain free from falls Outcome: Not Progressing   Problem: Bowel/Gastric: Goal: Gastrointestinal status for postoperative course will improve Outcome: Not Progressing   Problem: Clinical Measurements: Goal: Ability to maintain clinical measurements within normal limits will improve Outcome: Not Progressing Goal: Postoperative complications will be avoided or minimized Outcome: Not Progressing Goal: Diagnostic test results will improve Outcome: Not Progressing    Problem: Pain Management: Goal: Pain level will decrease Outcome: Not Progressing   Problem: Skin Integrity: Goal: Will show signs of wound healing Outcome: Not Progressing   Problem: Health Behavior/Discharge Planning: Goal: Identification of resources available to assist in meeting health care needs will improve Outcome: Not Progressing   Problem: Bladder/Genitourinary: Goal: Urinary functional status for postoperative course will improve Outcome: Not Progressing   Problem: Activity: Goal: Ability to tolerate increased activity will improve Outcome:  Not Progressing   Problem: Respiratory: Goal: Ability to maintain a clear airway and adequate ventilation will improve Outcome: Not Progressing   Problem: Role Relationship: Goal: Method of communication will improve Outcome: Not Progressing

## 2023-05-13 NOTE — Progress Notes (Signed)
**Note Billy-Identified via Obfuscation**  [facility] CARDIOLOGY PROGRESS NOTE       Patient ID: Billy Franco MRN: [id number] DOB/AGE: Dec 03, 1940 83 y.o.  Admit date: 05/10/2023 Referring Physician Dr. Duayne Gey Primary Physician Ava Lei, Arvis Laura, MD Primary Cardiologist Dr. Beau Bound Reason for Consultation Cardiac Clearance   HPI: Billy Franco is a 83 y.o. male  with a past medical history of CAD s/p stent, chronic systolic congestive heart failure (EF 25% - 30% - 04/2020), Ischemic Cardiomyopathy (refuses AICD), AF RVR (not on Eliquis  due to hematuria), T2DM, CKD, hypothyroidism who presented to the ED on 05/10/2023 for falling out of a chair on Friday. Patient is requiring surgical intervention and needs cardiac clearance. Cardiology was consulted for further evaluation.   Interval history: -Patient seen and examined this AM, resting comfortably in hospital bed with wife present.  -No complaints of CP, SOB, palpitations, dizziness.  -HR improved this AM. Was in AF with elevated rates overnight but converted to NSR around 5:30 AM.  -Cr again uptrending.   Review of systems complete and found to be negative unless listed above    Past Medical History:  Diagnosis Date   Arthritis    Lower back, hips   Atrial fibrillation (HCC)    CHF (congestive heart failure) (HCC)    CKD (chronic kidney disease), stage III (HCC)    Complication of anesthesia    has "crooked airway"  Diff breathing after extubated (after kidney stone procedure 07/2013)   Diabetes mellitus without complication (HCC)    GERD (gastroesophageal reflux disease)    Hypertension    Hypothyroidism    Nephrolithiasis     Past Surgical History:  Procedure Laterality Date   ANTERIOR CERVICAL DECOMP/DISCECTOMY FUSION N/A 05/11/2023   Procedure: ANTERIOR CERVICAL DECOMPRESSION/DISCECTOMY FUSION 2 LEVELS;  Surgeon: Jodeen Munch, MD;  Location: [facility] ORS;  Service: Neurosurgery;  Laterality: N/A;   CATARACT EXTRACTION W/PHACO Right 05/07/2021   Procedure:  CATARACT EXTRACTION PHACO AND INTRAOCULAR LENS PLACEMENT (IOC) RIGHT DIABETIC 8.63 00:56.5;  Surgeon: Clair Crews, MD;  Location: [facility];  Service: Ophthalmology;  Laterality: Right;  Diabetic   CATARACT EXTRACTION W/PHACO Left 05/21/2021   Procedure: CATARACT EXTRACTION PHACO AND INTRAOCULAR LENS PLACEMENT (IOC) LEFT DIABETIC 7.27 01:01.5;  Surgeon: Clair Crews, MD;  Location: [facility];  Service: Ophthalmology;  Laterality: Left;  Diabetic   CORONARY STENT INTERVENTION N/A 03/08/2020   Procedure: CORONARY STENT INTERVENTION;  Surgeon: Antonette Batters, MD;  Location: [facility] INVASIVE CV [facility];  Service: Cardiovascular;  Laterality: N/A;   LEFT HEART CATH AND CORONARY ANGIOGRAPHY Left 03/08/2020   Procedure: LEFT HEART CATH AND CORONARY ANGIOGRAPHY;  Surgeon: Antonette Batters, MD;  Location: [facility] INVASIVE CV LAB;  Service: Cardiovascular;  Laterality: Left;   NASAL FRACTURE SURGERY  1994   URETEROLITHOTOMY Right 2015    Medications Prior to Admission  Medication Sig Dispense Refill Last Dose/Taking   acetaminophen  (TYLENOL ) 650 MG CR tablet Take 650 mg by mouth every 8 (eight) hours as needed for pain.   05/10/2023 Morning   aluminum hydroxide-magnesium  carbonate (GAVISCON) 95-358 MG/15ML SUSP Take by mouth.   Taking   atorvastatin  (LIPITOR) 10 MG tablet Take 1 tablet (10 mg total) by mouth daily at 6 PM. 90 tablet 1 05/09/2023 Evening   carvedilol  (COREG ) 3.125 MG tablet Take 1 tablet (3.125 mg total) by mouth 2 (two) times daily with a meal. 60 tablet 0 05/10/2023 Morning   Cholecalciferol (VITAMIN D -1000 MAX ST) 25 MCG (1000 UT) tablet Take 1 tablet by mouth daily.  Taking   clopidogrel  (PLAVIX ) 75 MG tablet Take 75 mg by mouth daily.   05/10/2023 Morning   Cyanocobalamin (B-12) 1000 MCG TBCR Take 1,000 mcg by mouth every other day.   05/10/2023 Morning   docusate sodium  (COLACE) 50 MG capsule Take 100 mg by mouth 2 (two) times daily as needed for mild constipation.    Unknown   fexofenadine-pseudoephedrine (ALLEGRA-D 24) 180-240 MG 24 hr tablet Take 1 tablet by mouth daily as needed (allergies).   Unknown   furosemide  (LASIX ) 40 MG tablet Take 40 mg by mouth daily.   05/10/2023 Morning   glipiZIDE  2.5 MG TABS Take 1 tablet by mouth daily with breakfast. 90 tablet 1 Taking   levothyroxine  (SYNTHROID ) 25 MCG tablet TAKE 1 TABLET (25 MCG TOTAL) BY MOUTH DAILY BEFORE BREAKFAST. ON SUNDAYS, TAKE 2 TABLETS BY MOUTH BEFORE BREAKFAST. 102 tablet 1 05/10/2023 Morning   losartan  (COZAAR ) 25 MG tablet Take 1 tablet (25 mg total) by mouth at bedtime. 30 tablet 0 05/09/2023 Bedtime   magnesium  oxide (MAG-OX) 400 MG tablet Take 400 mg by mouth daily.   05/09/2023 Evening   metaxalone (SKELAXIN) 800 MG tablet Take 400 mg by mouth 2 (two) times daily.   05/09/2023 Bedtime   metFORMIN  (GLUCOPHAGE -XR) 750 MG 24 hr tablet Take 2 tablets (1,500 mg total) by mouth daily with breakfast. 180 tablet 1 05/10/2023 Morning   Nutritional Supplements (THERALITH XR PO) Take by mouth QID. 3.752-45-45-49.5mg . Take 2 in the evening and 2 at bedtime.   05/10/2023 Morning   pantoprazole  (PROTONIX ) 40 MG tablet Take 1 tablet (40 mg total) by mouth daily. 90 tablet 3 05/09/2023 Evening   polyethylene glycol powder (GLYCOLAX /MIRALAX ) 17 GM/SCOOP powder Take 17 g by mouth daily.   Unknown   rOPINIRole  (REQUIP ) 1 MG tablet Take 1 tablet (1 mg total) by mouth 2 (two) times daily. 180 tablet 1 05/09/2023 Bedtime   spironolactone (ALDACTONE) 25 MG tablet Take 12.5 mg by mouth daily.   05/10/2023   tamsulosin  (FLOMAX ) 0.4 MG CAPS capsule Take 1 capsule (0.4 mg total) by mouth every evening. 90 capsule 2 05/09/2023 Evening   triamcinolone  (KENALOG ) 0.1 % Apply 1 application topically 2 (two) times daily as needed (rash).   Unknown   blood glucose meter kit and supplies 1 each by Other route as directed. Dispense based on patient and insurance preference. Use up to four times daily as directed. (FOR ICD-10 E10.9,  E11.9). 1 each 1    glucose blood (ONETOUCH ULTRA) test strip USE AS DIRECTED TO CHECK BLOOD GLUCOSE ONCE DAILY. 100 strip 3    Lancets (ONETOUCH DELICA PLUS LANCET33G) MISC CHECK BLOOD SUGAR AT NOON 100 each 1    Social History   Socioeconomic History   Marital status: Married    Spouse name: Sport and exercise psychologist    Number of children: 3   Years of education: Not on file   Highest education level: Not on file  Occupational History   Occupation: retired     Comment: IT trainer at a [employer]   Tobacco Use   Smoking status: Never   Smokeless tobacco: Never  Vaping Use   Vaping status: Never Used  Substance and Sexual Activity   Alcohol use: No    Alcohol/week: 0.0 standard drinks of alcohol   Drug use: No   Sexual activity: Not Currently  Other Topics Concern   Not on file  Social History Narrative   Married, had 3 children, one died at a MVA at age  17   Geralynn Knife of a church    Social Drivers of Corporate investment banker Strain: Low Risk  (01/24/2019)   Overall Financial Resource Strain (CARDIA)    Difficulty of Paying Living Expenses: Not hard at all  Food Insecurity: No Food Insecurity (05/11/2023)   Hunger Vital Sign    Worried About Running Out of Food in the Last Year: Never true    Ran Out of Food in the Last Year: Never true  Transportation Needs: No Transportation Needs (05/11/2023)   PRAPARE - Administrator, Civil Service (Medical): No    Lack of Transportation (Non-Medical): No  Physical Activity: Sufficiently Active (01/24/2019)   Exercise Vital Sign    Days of Exercise per Week: 5 days    Minutes of Exercise per Session: 60 min  Stress: No Stress Concern Present (01/24/2019)   [employer] of Occupational Health - Occupational Stress Questionnaire    Feeling of Stress : Not at all  Social Connections: Unknown (05/11/2023)   Social Connection and Isolation Panel [NHANES]    Frequency of Communication with Friends and Family: More than three times a week     Frequency of Social Gatherings with Friends and Family: More than three times a week    Attends Religious Services: More than 4 times per year    Active Member of [employer] or Organizations: Not on file    Attends Banker Meetings: Not on file    Marital Status: Married  Intimate Partner Violence: Not At Risk (05/11/2023)   Humiliation, Afraid, Rape, and Kick questionnaire    Fear of Current or Ex-Partner: No    Emotionally Abused: No    Physically Abused: No    Sexually Abused: No    Family History  Problem Relation Age of Onset   Heart disease Father      Vitals:   05/13/23 0357 05/13/23 0400 05/13/23 0500 05/13/23 0600  BP:      Pulse:  (!) 137 (!) 123 96  Resp:  17 19 20   Temp:  98.9 F (37.2 C)    TempSrc:      SpO2:  100% 100% 100%  Weight: 74.1 kg     Height:        PHYSICAL EXAM General: Well appearing elderly male, well nourished, in no acute distress. HEENT: Normocephalic and atraumatic. Neck: No JVD.   Lungs: Normal respiratory effort on room air. Clear bilaterally to auscultation. No wheezes, crackles, rhonchi.  Heart: HRRR. Normal S1 and S2 without gallops or murmurs.  Abdomen: Non-distended appearing.  Msk: Normal strength and tone for age. Extremities: Warm and well perfused. No clubbing, cyanosis. No edema.  Neuro: Alert and oriented X 3. Psych: Answers questions appropriately.   Labs: Basic Metabolic Panel: Recent Labs    05/12/23 0545 05/13/23 0338  NA 133* 134*  K 3.8 3.9  CL 105 107  CO2 17* 17*  GLUCOSE 165* 125*  BUN 35* 44*  CREATININE 1.63* 2.44*  CALCIUM  8.5* 8.4*  MG 1.7 2.1  PHOS 4.6 4.6   Liver Function Tests: Recent Labs    05/10/23 1621  AST 30  ALT 22  ALKPHOS 57  BILITOT 1.0  PROT 7.1  ALBUMIN 4.0   No results for input(s): "LIPASE", "AMYLASE" in the last 72 hours. CBC: Recent Labs    05/10/23 1621 05/11/23 0300 05/12/23 0545 05/13/23 0338  WBC 6.4   < > 11.3* 15.2*  NEUTROABS 4.5  --   --   --  HGB 10.6*   < > 10.7* 11.0*  HCT 32.5*   < > 31.5* 33.5*  MCV 98.5   < > 96.3 98.5  PLT 210   < > 243 247   < > = values in this interval not displayed.   Cardiac Enzymes: Recent Labs    05/10/23 1621  CKTOTAL 292   BNP: No results for input(s): "BNP" in the last 72 hours. D-Dimer: No results for input(s): "DDIMER" in the last 72 hours. Hemoglobin A1C: No results for input(s): "HGBA1C" in the last 72 hours. Fasting Lipid Panel: No results for input(s): "CHOL", "HDL", "LDLCALC", "TRIG", "CHOLHDL", "LDLDIRECT" in the last 72 hours. Thyroid  Function Tests: No results for input(s): "TSH", "T4TOTAL", "T3FREE", "THYROIDAB" in the last 72 hours.  Invalid input(s): "FREET3" Anemia Panel: No results for input(s): "VITAMINB12", "FOLATE", "FERRITIN", "TIBC", "IRON", "RETICCTPCT" in the last 72 hours.   Radiology: US  EKG SITE RITE Result Date: 05/12/2023 If [facility] image not attached, placement could not be confirmed due to current cardiac rhythm.  DG Cervical Spine 2-3 Views Result Date: 05/11/2023 CLINICAL DATA:  Elective surgery. EXAM: CERVICAL SPINE - 2-3 VIEW COMPARISON:  Preoperative imaging FINDINGS: Two fluoroscopic spot views of the cervical spine submitted from the operating room. Anterior fusion C3 through C5 with interbody spacers. Fluoroscopy time 4 seconds. Dose 0.29 mGy. IMPRESSION: Intraoperative fluoroscopy during cervical spine fusion. Electronically Signed   By: Chadwick Colonel M.D.   On: 05/11/2023 17:04   DG C-Arm 1-60 Min-No Report Result Date: 05/11/2023 Fluoroscopy was utilized by the requesting physician.  No radiographic interpretation.   DG C-Arm 1-60 Min-No Report Result Date: 05/11/2023 Fluoroscopy was utilized by the requesting physician.  No radiographic interpretation.   MR Cervical Spine Wo Contrast Result Date: 05/10/2023 CLINICAL DATA:  Ataxia, cervical trauma arm weakness, eval central cord EXAM: MRI CERVICAL SPINE WITHOUT CONTRAST TECHNIQUE:  Multiplanar, multisequence MR imaging of the cervical spine was performed. No intravenous contrast was administered. COMPARISON:  None Available. FINDINGS: Alignment: No substantial sagittal subluxation. Vertebrae: No bone marrow edema to suggest acute fracture. Edema in the anterior C6-C7 disc space with extension anteriorly and destruction of the anterior longitudinal ligament (see series 3, image 8). Adjacent prevertebral edema which extends inferiorly into the upper thoracic spine. Cord: T2 hyperintensity within the cord at C3-C4. Posterior Fossa, vertebral arteries, paraspinal tissues: The visualized 2 artery flow voids are maintained. Small volume of prevertebral edema in the lower cervical spine extending into the upper thoracic spine. Disc levels: C2-C3: Small posterior disc osteophyte complex. Mild canal stenosis. Bilateral facet uncovertebral hypertrophy with mild left foraminal stenosis. C3-C4: Posterior disc osteophyte complex and ligamentum flavum thickening. Resulting severe canal stenosis with cord compression. Bilateral facet uncovertebral hypertrophy. Resulting severe bilateral foraminal stenosis. C4-C5: Posterior disc osteophyte complex and ligamentum flavum thickening. Resulting moderate to severe canal stenosis. Bilateral facet and vertebral hypertrophy with severe bilateral foraminal stenosis. C5-C6: Segmentation anomaly. Partial fusion across the disc. Bilateral facet uncovertebral hypertrophy. Canal and foramina are patent. C6-C7: Posterior disc osteophyte complex with central disc protrusion. Bilateral facet and uncovertebral hypertrophy. Moderate bilateral foraminal stenosis. Mild to moderate canal stenosis. C7-T1: Uncovertebral hypertrophy without significant canal or foraminal stenosis. IMPRESSION: 1. 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 Edema extends posteriorly to involve the C6-C7 disc, compatible with traumatic disc injury. Small volume of adjacent prevertebral  edema. 2. At C3-C4, severe canal stenosis with cord compression. T2 hyperintensity in the cord at this level is compatible with cord edema and/or myelomalacia.  Severe bilateral foraminal stenosis at this level. 3. At C4-C5, moderate to severe canal stenosis and severe bilateral foraminal stenosis. 4. At C6-C7, mild to moderate canal stenosis and moderate bilateral foraminal stenosis. Electronically Signed   By: Stevenson Elbe M.D.   On: 05/10/2023 20:41   MR BRAIN WO CONTRAST Result Date: 05/10/2023 CLINICAL DATA:  Neuro deficit, acute, stroke suspected left sided weakness EXAM: MRI HEAD WITHOUT CONTRAST TECHNIQUE: Multiplanar, multiecho pulse sequences of the brain and surrounding structures were obtained without intravenous contrast. COMPARISON:  CT head and CT cervical spine from earlier today. FINDINGS: Brain: No acute infarction, hemorrhage, hydrocephalus, extra-axial collection or mass lesion. Remote right PCA territory infarct. Vascular: Major arterial flow voids are maintained at the skull base. Skull and upper cervical spine: Normal marrow signal. Sinuses/Orbits: Negative. IMPRESSION: 1. No evidence of acute intracranial abnormality. 2. Remote right PCA territory infarct. Electronically Signed   By: Stevenson Elbe M.D.   On: 05/10/2023 20:22   CT Thoracic Spine Wo Contrast Result Date: 05/10/2023 CLINICAL DATA:  Back trauma, fall from chair. EXAM: CT THORACIC AND LUMBAR SPINE WITHOUT CONTRAST TECHNIQUE: Multidetector CT imaging of the thoracic and lumbar spine was performed without contrast. Multiplanar CT image reconstructions were also generated. RADIATION DOSE REDUCTION: This exam was performed according to the departmental dose-optimization program which includes automated exposure control, adjustment of the mA and/or kV according to patient size and/or use of iterative reconstruction technique. COMPARISON:  10/08/2022, 04/28/2022 FINDINGS: CT THORACIC SPINE FINDINGS Alignment: Normal.  Vertebrae: No acute fracture or focal pathologic process. Paraspinal and other soft tissues: No paraspinal fat stranding or epidural hematoma. Aortic atherosclerosis is noted. There are multi-vessel coronary artery calcifications. Pleural and parenchymal scarring is noted at the lung apices. There are trace bilateral pleural effusions with atelectasis. Disc levels: Multilevel degenerative endplate changes. There is calcification of the supraspinous ligament. CT LUMBAR SPINE FINDINGS Segmentation: 5 lumbar type vertebrae. Alignment: Normal. Vertebrae: There is a old fracture of the L1, L2, and L3 transverse processes on the left with bone callus formation. No acute fracture is seen. Paraspinal and other soft tissues: No acute abnormality. Aortic atherosclerosis is seen. Left renal calculi are noted. Disc levels: Multi level degenerative endplate changes and facet arthropathy. There is a disc herniation at L4-L5 resulting in severe spinal canal stenosis and moderate to severe neural foraminal stenosis bilaterally, greater on the left than on the right. The spinal canal and neural foramina are otherwise patent. IMPRESSION: CT THORACIC SPINE IMPRESSION 1. Multilevel degenerative changes without evidence of acute fracture. 2. Trace bilateral pleural effusions with atelectasis. 3. Aortic atherosclerosis with coronary artery calcifications. CT LUMBAR SPINE IMPRESSION 1. 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 2. Multilevel degenerative changes in the lumbar spine as described above. 3. Left renal calculi. Electronically Signed   By: Wyvonnia Heimlich M.D.   On: 05/10/2023 20:01   CT Lumbar Spine Wo Contrast Result Date: 05/10/2023 CLINICAL DATA:  Back trauma, fall from chair. EXAM: CT THORACIC AND LUMBAR SPINE WITHOUT CONTRAST TECHNIQUE: Multidetector CT imaging of the thoracic  and lumbar spine was performed without contrast. Multiplanar CT image reconstructions were also generated. RADIATION DOSE REDUCTION: This exam was performed according to the departmental dose-optimization program which includes automated exposure control, adjustment of the mA and/or kV according to patient size and/or use of iterative reconstruction technique. COMPARISON:  10/08/2022, 04/28/2022 FINDINGS: CT THORACIC SPINE FINDINGS Alignment:  Normal. Vertebrae: No acute fracture or focal pathologic process. Paraspinal and other soft tissues: No paraspinal fat stranding or epidural hematoma. Aortic atherosclerosis is noted. There are multi-vessel coronary artery calcifications. Pleural and parenchymal scarring is noted at the lung apices. There are trace bilateral pleural effusions with atelectasis. Disc levels: Multilevel degenerative endplate changes. There is calcification of the supraspinous ligament. CT LUMBAR SPINE FINDINGS Segmentation: 5 lumbar type vertebrae. Alignment: Normal. Vertebrae: There is a old fracture of the L1, L2, and L3 transverse processes on the left with bone callus formation. No acute fracture is seen. Paraspinal and other soft tissues: No acute abnormality. Aortic atherosclerosis is seen. Left renal calculi are noted. Disc levels: Multi level degenerative endplate changes and facet arthropathy. There is a disc herniation at L4-L5 resulting in severe spinal canal stenosis and moderate to severe neural foraminal stenosis bilaterally, greater on the left than on the right. The spinal canal and neural foramina are otherwise patent. IMPRESSION: CT THORACIC SPINE IMPRESSION 1. Multilevel degenerative changes without evidence of acute fracture. 2. Trace bilateral pleural effusions with atelectasis. 3. Aortic atherosclerosis with coronary artery calcifications. CT LUMBAR SPINE IMPRESSION 1. Disc herniation with facet arthropathy and degenerative endplate changes at L4-L5 resulting in severe spinal  canal stenosis and moderate-to-severe neural foraminal stenosis at this level. 2. Multilevel degenerative changes in the lumbar spine as described above. 3. Left renal calculi. Electronically Signed   By: Wyvonnia Heimlich M.D.   On: 05/10/2023 20:01   CT Cervical Spine Wo Contrast Result Date: 05/10/2023 CLINICAL DATA:  Fall, weakness EXAM: CT CERVICAL SPINE WITHOUT CONTRAST TECHNIQUE: Multidetector CT imaging of the cervical spine was performed without intravenous contrast. Multiplanar CT image reconstructions were also generated. RADIATION DOSE REDUCTION: This exam was performed according to the departmental dose-optimization program which includes automated exposure control, adjustment of the mA and/or kV according to patient size and/or use of iterative reconstruction technique. COMPARISON:  None Available. FINDINGS: Alignment: No subluxation.  Facet alignment within normal limits. Skull base and vertebrae: No fracture Soft tissues and spinal canal: No prevertebral fluid or swelling. No visible canal hematoma. Disc levels: Prominent multilevel anterior osteophytes. Fusion of the C5-C6 vertebral bodies and posterior elements. Multilevel degenerative changes with osteophytes and mild disc space narrowing. Small central disc protrusion at C2-C3. At least mild canal stenosis. Posterior disc osteophyte complex at C3-C4 with moderate severe canal stenosis. Posterior disc osteophyte C4-C5 with at least moderate canal stenosis. Posterior disc osteophyte C6-C7 with at least mild canal stenosis and mild mass effect on the anterior thecal sac. Multilevel facet degenerative change. Multilevel foraminal narrowing. Upper chest: Apical scarring Other: None IMPRESSION: 1. Negative for acute fracture or subluxation of the cervical spine. 2. 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 Electronically Signed   By: Esmeralda Hedge M.D.   On: 05/10/2023 17:03   DG Chest Port 1  View Result Date: 05/10/2023 CLINICAL DATA:  Weakness EXAM: PORTABLE CHEST 1 VIEW COMPARISON:  12/21/2019 FINDINGS: Pleural-parenchymal scarring at the apices. No consolidation, pleural effusion or pneumothorax. Borderline to mild cardiomegaly. Aortic atherosclerosis. IMPRESSION: No active disease. Borderline to mild cardiomegaly. Electronically Signed   By: Esmeralda Hedge M.D.   On: 05/10/2023 16:55   CT Head Wo Contrast Result Date: 05/10/2023 CLINICAL DATA:  Head trauma weakness EXAM: CT HEAD WITHOUT CONTRAST TECHNIQUE: Contiguous axial images were obtained from the base of the skull through the vertex without intravenous contrast. RADIATION DOSE REDUCTION: This exam was performed according to  the departmental dose-optimization program which includes automated exposure control, adjustment of the mA and/or kV according to patient size and/or use of iterative reconstruction technique. COMPARISON:  CT brain 05/02/2019 FINDINGS: Brain: Mild motion degradation limits the exam. Interval small chronic appearing right occipital infarct. Mild atrophy. Patchy white matter hypodensity most likely chronic small vessel ischemic change. The ventricles are non enlarged. Vascular: No hyperdense vessels.  No unexpected calcification Skull: No definitive fracture but limited by motion Sinuses/Orbits: No acute finding. Other: None IMPRESSION: 1. Mild motion degradation limits the exam. No definite CT evidence for acute intracranial abnormality. 2. Atrophy and chronic small vessel ischemic changes of the white matter. Interval small chronic appearing right occipital infarct, new compared to 2021 head CT. Electronically Signed   By: Esmeralda Hedge M.D.   On: 05/10/2023 16:53    ECHO: 05/14/2020  MODERATE LV SYSTOLIC DYSFUNCTION  MILD RV SYSTOLIC DYSFUNCTION  NO VALVULAR STENOSIS  MODERATE MR, TR  MILD AR, PR  EF 30%   TELEMETRY reviewed by me 05/13/2023: Sinus rhythm rate 80s, AF overnight with elevated rates  EKG  reviewed by me: Sinus rhythm with PACs, rate 67 bpm  Data reviewed by me 05/13/2023: last 24h vitals tele labs imaging I/O ED provider note, admission H&P, neurosurgery, critical care notes.  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 male  with a past medical history of CAD s/p stent (2022), chronic systolic congestive heart failure (EF 25% - 30% - 04/2020), Ischemic Cardiomyopathy (refuses AICD), AF RVR (not on Eliquis  due to hematuria), T2DM, CKD, hypothyroidism who presented to the ED on 05/10/2023 for falling out of a chair on Friday. Patient is requiring surgical intervention and needs cardiac clearance. Cardiology was consulted for further evaluation.   # Fall, Fracture/disc herniation at C5/6 with moderate stenosis #Paroxysmal Atrial fibrillation # Chronic systolic congestive heart failure (EF 25% - 30% - 04/2020)  # Ischemic Cardiomyopathy (refuses AICD) # CAD s/p stent placement Patient presents to the ED after falling from chair on Friday leading to a new fracture/disc herniation at C5/6 s/p surgical decompression 05/11/23. Patient has history of atrial fibrillation, in AF overnight 4/22. Patient is not on DOAC due to history of hematuria. -Continue holding plavix , per neurosurgery may resume 14 days after surgery. Will start asa 81 mg daily today. -Continue Atorvastatin  10 mg daily. -Resume coreg  3.125 mg twice daily for rate control. Patient unable to tolerate DOAC due to hematuria.  -Plan to resume home GDMT post-op as BP and renal function allow. Cr elevated this AM limiting resumption of home ARB. Has been unable to afford Entresto  in the past.  This patient's plan of care was discussed and created with Dr.  Bob Burn and he is in agreement.    Signed: Hamp Levine, PA-C  05/13/2023, 8:27 AM [facility]

## 2023-05-13 NOTE — Progress Notes (Signed)
   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 Swallowing well with minimal pain   Physical Exam: Vitals:   05/13/23 0500 05/13/23 0600  BP:    Pulse: (!) 123 96  Resp: 19 20  Temp:    SpO2: 100% 100%   Oriented to self and place only  CNI  Strength: Side Biceps Triceps Deltoid Interossei Grip Wrist Ext. Wrist Flex.  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 Ok to remove when in bed and when eating  Billy Karvonen PA-C Department of Neurosurgery

## 2023-05-13 NOTE — Progress Notes (Signed)
 Transported pt to and from CT via vent with no issues.

## 2023-05-13 NOTE — Procedures (Signed)
 Intubation Procedure Note  Anibal Quinby  [id number]  January 17, 1941  Date:05/13/23  Time:3:34 PM   Provider Performing:Omaira Mellen D Maryagnes Small    Procedure: Intubation (31500)  Indication(s) Respiratory Failure  Consent Risks of the procedure as well as the alternatives and risks of each were explained to the patient and/or caregiver.  Consent for the procedure was obtained and is signed in the bedside chart   Anesthesia Versed , Fentanyl , and Rocuronium    Time Out Verified patient identification, verified procedure, site/side was marked, verified correct patient position, special equipment/implants available, medications/allergies/relevant history reviewed, required imaging and test results available.   Sterile Technique Usual hand hygeine, masks, and gloves were used   Procedure Description Patient positioned in bed supine.  Sedation given as noted above.  Patient was intubated with endotracheal tube using Glidescope.  View was Grade 1 full glottis .  Number of attempts was 1.  Colorimetric CO2 detector was consistent with tracheal placement.   Complications/Tolerance None; patient tolerated the procedure well. Chest X-ray is ordered to verify placement.   EBL Minimal   Specimen(s) None    Size 8.0 ETT. Tube secured at 22 cm at lip.   Cherylann Corpus, AGACNP-BC [facility] Prefer epic messenger for cross cover needs If after hours, please call E-link

## 2023-05-13 NOTE — Progress Notes (Signed)
 PT Cancellation Note  Patient Details Name: Billy Franco MRN: [id number] DOB: 06-10-1940   Cancelled Treatment:    Reason Eval/Treat Not Completed: Medical issues which prohibited therapy RN states patient is not appropriate for PT today due to medical concerns. Will continue to follow and re-attempt if/when appropriate.  Ellarie Picking 05/13/2023, 2:21 PM

## 2023-05-13 NOTE — Progress Notes (Signed)
 Peripherally Inserted Central Catheter Placement  The IV Nurse has discussed with the patient and/or persons authorized to consent for the patient, the purpose of this procedure and the potential benefits and risks involved with this procedure.  The benefits include less needle sticks, lab draws from the catheter, and the patient may be discharged home with the catheter. Risks include, but not limited to, infection, bleeding, blood clot (thrombus formation), and puncture of an artery; nerve damage and irregular heartbeat and possibility to perform a PICC exchange if needed/ordered by physician.  Alternatives to this procedure were also discussed.  Bard Power PICC patient education guide, fact sheet on infection prevention and patient information card has been provided to patient /or left at bedside.    PICC Placement Documentation  PICC Double Lumen 05/13/23 Right Basilic 46 cm 1 cm (Active)  Indication for Insertion or Continuance of Line Vasoactive infusions 05/13/23 1333  Exposed Catheter (cm) 1 cm 05/13/23 1333  Site Assessment Clean, Dry, Intact 05/13/23 1333  Lumen #1 Status Flushed;Saline locked;Blood return noted 05/13/23 1333  Lumen #2 Status Flushed;Saline locked;Blood return noted 05/13/23 1333  Dressing Type Transparent;Securing device 05/13/23 1333  Dressing Status Antimicrobial disc/dressing in place;Clean, Dry, Intact 05/13/23 1333  Line Adjustment (NICU/IV Team Only) No 05/13/23 1333  Dressing Intervention Adhesive placed at insertion site (IV team only) 05/13/23 1333  Dressing Change Due 05/20/23 05/13/23 1333       Daphney Eans 05/13/2023, 1:37 PM

## 2023-05-13 NOTE — Progress Notes (Signed)
 BRIEF PCCM NOTE  Pt was seen earlier by [facility] provider Dr. Aleskerov.  Please see his Progress Note for full assessment & plan.  BRIEF PT DESCRIPTION / SYNOPSIS :  83 y.o male with significant PMH of T2DM, CKD stage III, HTN, HLD, HFrEF with EF 25-30%, refused AICD, ischemic Cardiomyopathy, A-Fib not on anticoagulation due to gross hematuria, CAD s/p PCI and stent, GERD, RLS and chronic back pain who presented to the ED with chief complaints of bilateral upper and lower extremities weakness and numbness s/p fall. Found to have new fracture/disc herniation Ligamentous injury with edema at C5/6 with moderate stenosis, status post cervical Diskectomy and fusion at C3/4 and C4/5 on 05/11/23.  Hospital course complicated by Acute Metabolic Encephalopathy with Acute Hypoxic Respiratory Failure due to presumed aspiration requiring intubation and mechanical ventilation for airway protection.  SUBJECTIVE / INTERVAL HISTORY :  -Called to bedside by Nursing staff and Dr. Mont Antis due to concern with respiratory status given increased work of breathing and increasing FiO2 requirements.  Pt noted to have increased neck swelling post drain removal this morning, and has gurgling secretions.  Pt is somnolent after receiving 1 mg Versed  for PICC placement (pt previously very agitated and restless requiring Precedex ).  Pt given 0.5 mg Romazicon  with no effect.  Given his work of breathing and concern for ablity to protect his airway and aspiration, decision made to proceed with intubation.  Pt's wife and granddaughter updated at bedside and in agreement with proceeding with intubation.  OBJECTIVE :   Today's Vitals   05/13/23 0800 05/13/23 0809 05/13/23 1200 05/13/23 1520  BP:      Pulse: 91 86 83   Resp: 16 18 19    Temp: 98.7 F (37.1 C)  98.9 F (37.2 C)   TempSrc: Oral  Oral   SpO2: 99% 98% 100% 100%  Weight:      Height:      PainSc: 0-No pain  0-No pain    Body mass index is 25.59  kg/m.   ASSESSMENT / PLAN :   #Acute Hypoxic Respiratory Failure due to ... #Presumed Aspiration #Intubated for Airway Protection #Neck swelling post drain removal -Full vent support, implement lung protective strategies -Plateau pressures less than 30 cm H20 -Wean FiO2 & PEEP as tolerated to maintain O2 sats >92% -Follow intermittent Chest X-ray & ABG as needed -Spontaneous Breathing Trials when respiratory parameters met and mental status permits -Implement VAP Bundle -Prn Bronchodilators -IV Steroids (Decadron ) -Start empiric Zosyn   -Discussed with Dr. Mont Antis, will obtain CT Neck & CT Chest once hemodynamically stable to transfer to obtain scans  #Meets SIRS Criteria (RR >20, WBC 15.2) #Sepsis due to Presumed Aspiration -Monitor fever curve -Trend WBC's & Procalcitonin -Follow cultures as above -Start empiric Zosyn  pending cultures & sensitivities  #Hypotension: ? Developing septic shock +/- Cardiogenic #Intermittent Bradycardia & Pauses on telemetry #Paroxysmal Atrial Fibrillation #Chronic HFrEF (EF 25-30% in 02/2020) without acute exacerbation PMHx: CAD s/p stent, chronic systolic congestive heart failure (EF 25% - 30% - 04/2020), Ischemic Cardiomyopathy (refuses AICD), AF RVR (not on Eliquis  due to hematuria)  -Continuous cardiac monitoring -Maintain MAP >85 for spinal cord perfusion -Cautious IV fluids -Vasopressors as needed to maintain MAP goal -Trend lactic acid until normalized -Trend HS Troponin until peaked -Echocardiogram pending -Hold Coreg  -Check TSH and thyroid  panel -Cardiology following, appreciate input   #Acute Kidney Injury on CKD Stage III #Metabolic Acidosis #Hyponatremia -Monitor I&O's / urinary output -Follow BMP -Ensure adequate renal perfusion -Avoid nephrotoxic  agents as able -Replace electrolytes as indicated ~ Pharmacy following for assistance with electrolyte replacement -Start Bicarb gtt  #Diabetes Mellitus with  Hyperglycemia -Follow ICU Hypo/Hyperglycemia protocol -Will start insulin  gtt  #Fall, Fracture/disc herniation at C5/6 with moderate stenosis s/p ACDF on 4/21 -Neurosurgery following, appreciate input ~ site assessment, activity and restrictions as per recommendations -Discussed with Dr. Mont Antis, will obtain CT Neck given neck swelling post drain removal  #Acute Metabolic Encephalopathy #Sedation needs in setting of mechanical ventilation -Treatment of sepsis and metabolic derangements as outlined above -Maintain a RASS goal of 0 to -1 -Fentanyl  and Propofol  as needed to maintain RASS goal -Avoid sedating medications as able -Daily wake up assessment -Obtain CT Head          Additional Critical Care Time: 30 minutes  Cherylann Corpus, AGACNP-BC [facility] Prefer epic messenger for cross cover needs If after hours, please call E-link

## 2023-05-13 NOTE — Progress Notes (Signed)
 OT Cancellation Note  Patient Details Name: Decorey Wahlert MRN: [id number] DOB: June 29, 1940   Cancelled Treatment:    Reason Eval/Treat Not Completed: Other (comment);Medical issues which prohibited therapy (per RN report, pt is agitated, not approrpiate for OT intervention at this time. OT will follow up as able.Gerre Kraft, OTD OTR/L  05/13/23, 3:28 PM

## 2023-05-13 NOTE — Progress Notes (Signed)
 PHARMACY CONSULT NOTE - FOLLOW UP  Pharmacy Consult for Electrolyte Monitoring and Replacement   Recent Labs: Potassium (mmol/L)  Date Value  05/13/2023 3.9  08/17/2013 4.1   Magnesium  (mg/dL)  Date Value  16/10/9602 2.1  08/15/2013 1.8   Calcium  (mg/dL)  Date Value  54/09/8117 8.4 (L)   Calcium , Total (mg/dL)  Date Value  14/78/2956 8.7   Albumin (g/dL)  Date Value  21/30/8657 4.0  10/30/2014 4.4   Phosphorus (mg/dL)  Date Value  84/69/6295 4.6   Sodium (mmol/L)  Date Value  05/13/2023 134 (L)  10/30/2014 140  08/17/2013 138     Assessment: 83 y.o. male  with a past medical history of CAD s/p stent, chronic systolic congestive heart failure (EF 25% - 30% - 04/2020), Ischemic Cardiomyopathy (refuses AICD), AF RVR (not on Eliquis  due to hematuria), T2DM, CKD, hypothyroidism who presented to the ED on 05/10/2023 for falling out of a chair on Friday.   Goal of Therapy:  Electrolytes WNL  Plan:  ---no electrolyte replacement warranted for today ---recheck electrolytes in am  Adalberto Acton ,PharmD Clinical Pharmacist 05/13/2023 7:13 AM

## 2023-05-13 NOTE — Plan of Care (Signed)
  Problem: Coping: Goal: Ability to adjust to condition or change in health will improve Outcome: Not Progressing   Problem: Fluid Volume: Goal: Ability to maintain a balanced intake and output will improve Outcome: Progressing   Problem: Metabolic: Goal: Ability to maintain appropriate glucose levels will improve Outcome: Progressing   Problem: Nutritional: Goal: Maintenance of adequate nutrition will improve Outcome: Progressing   Problem: Skin Integrity: Goal: Risk for impaired skin integrity will decrease Outcome: Progressing   Problem: Tissue Perfusion: Goal: Adequacy of tissue perfusion will improve Outcome: Progressing   Problem: Clinical Measurements: Goal: Ability to maintain clinical measurements within normal limits will improve Outcome: Progressing

## 2023-05-13 NOTE — Progress Notes (Signed)
 NAME:  Billy Franco, MRN:  [id number], DOB:  1941-01-07, LOS: 3 ADMISSION DATE:  05/10/2023, CONSULTATION DATE:  05/10/23 REFERRING MD:  Claria Crofts CHIEF COMPLAINT:  traumatic fall    HPI  83 y.o male with significant PMH of  T2DM, CKD stage III, HTN, HLD, HFrEF with EF 25-30%, refused AICD, ischemic Cardiomyopathy, A-Fib  not on anticoagulation due to gross hematuria, CAD s/p PCI and stent, GERD, RLS and chronic back pain who presented to the ED with chief complaints of bilateral upper and lower extremities weakness and numbness s/p fall.  Per family at the bedside, patient fell out of a folding chair on Friday and landed on his left side. Following the fall, he reported generalized weakness, numbness and difficulties grasping objects with his hands. He reported hitting his head without LOC.   ED Course: Initial vital signs showed HR of  86 beats/minute, BP 103/88 mm Hg, the RR 16 breaths/minute, and the oxygen saturation 100% on RA and a temperature of 97.3F (36.4C).  Pertinent Labs/Diagnostics Findings: Na+/ K+:137/4.0  Glucose:103 BUN/Cr.:37/1.41 WBC: 6.4 K/L Hgb/Hct:10.6/32.5 CXR> CTH> CT Cervical, thoracic, lumbar and MRI brain, cervical as reported below with significant findings of new fracture/disc herniation at C5/6 with moderate stenosis. Findings discussed with on call Neurosurgeon who recommended admission to ICU with frequent neurochecks, pressor to keep MAP>85 and possible surgical intervention. PCCM consulted for admission.  05/11/23- patient s/p surgery today.  Lovenox  to initiate in am per nsgy. I met with patient and family at bedside. He appears stable and is in good spirits  05/12/23- patient doing well after decomrpession.  Patient reports feelings of falling and moving of med, I repositioned patient and reviewed what I could with family but patient still complains of these symptoms,  reviewed this with neurosurgeon.  He remains on neosynephrine.      Past Medical  History  T2DM, CKD stage III, HTN, HLD, HFrEF with EF 25-30%, refused AICD, ischemic Cardiomyopathy, A-Fib  not on anticoagulation due to gross hematuria, CAD s/p PCI and stent, GERD, RLS and chronic back pain   Significant Hospital Events   4/20:admit with cervical spine injury following a traumatic fall 05/13/23- JP drain removed,  patient with confusion/aggitation overnight started on precedex . Developing worsening AKI , have reviewed meds and started D5-HcO3 @75cc /hr , may need to discuss with family regarding intubation /GOC  Consults:  Neurosurgery  Procedures:    Significant Diagnostic Tests:  04/20 Chest Xray> IMPRESSION: No active disease. Borderline to mild cardiomegaly.  04/20 CT Cervical Spine> IMPRESSION: 1. Negative for acute fracture or subluxation of the cervical spine. 2. 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  04/20 CT THORACIC SPINE IMPRESSION IMPRESSION: 1. Multilevel degenerative changes without evidence of acute fracture. 2. Trace bilateral pleural effusions with atelectasis. 3. Aortic atherosclerosis with coronary artery calcifications.   04/20 CT LUMBAR SPINE IMPRESSION 1. 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 2. Multilevel degenerative changes in the lumbar spine as described above. 3. Left renal calculi.  04/20 Noncontrast CT head> IMPRESSION: 1. Mild motion degradation limits the exam. No definite CT evidence for acute intracranial abnormality. 2. Atrophy and chronic small vessel ischemic changes of the white matter. Interval small chronic appearing right occipital infarct, new compared to 2021 head CT.  4/20 MRI Brain> IMPRESSION: 1. No evidence of acute intracranial abnormality. 2. Remote right  PCA territory infarct.  4/20 MRI Cervical  Spine> IMPRESSION: 1. 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 Edema extends posteriorly to involve the C6-C7 disc, compatible with traumatic disc injury. Small volume of adjacent prevertebral edema. 2. At C3-C4, severe canal stenosis with cord compression. T2 hyperintensity in the cord at this level is compatible with cord edema and/or myelomalacia. Severe bilateral foraminal stenosis at this level. 3. At C4-C5, moderate to severe canal stenosis and severe bilateral foraminal stenosis. 4. At C6-C7, mild to moderate canal stenosis and moderate bilateral foraminal stenosis.  Interim History / Subjective:      Micro Data:  4/20: MRSA PCR>>   Antimicrobials:  None  OBJECTIVE  Blood pressure 98/60, pulse 86, temperature 98.7 F (37.1 C), temperature source Oral, resp. rate 18, height 5\' 7"  (1.702 m), weight 74.1 kg, SpO2 98%.        Intake/Output Summary (Last 24 hours) at 05/13/2023 0952 Last data filed at 05/13/2023 0500 Gross per 24 hour  Intake 1295.62 ml  Output 460 ml  Net 835.62 ml   Filed Weights   05/10/23 2245 05/12/23 0500 05/13/23 0357  Weight: 68.5 kg 73.6 kg 74.1 kg     Physical Examination  GENERAL:83  year-old critically ill patient lying in the bed in no acute distress EYES: PEERLA. No scleral icterus. Extraocular muscles intact.  HEENT: Head traumatic,head laceration. normocephalic. Oropharynx and nasopharynx clear.  NECK:  No JVD, supple  LUNGS: Normal breath sounds bilaterally.  No use of accessory muscles of respiration.  CARDIOVASCULAR: S1, S2 normal. No murmurs, rubs, or gallops.  ABDOMEN: Soft, NTND EXTREMITIES: No swelling or erythema. Grip 4/5 bilaterally. Pulses palpable distally. NEUROLOGIC: The patient is A&O X 4. No focal neurological deficit appreciated. Cranial nerves are intact.  SKIN: No obvious rash, lesion, or ulcer. Warm to touch Labs/imaging that I havepersonally reviewed  (right click and "Reselect all SmartList  Selections" daily)     Labs   CBC: Recent Labs  Lab 05/10/23 1621 05/11/23 0300 05/12/23 0545 05/13/23 0338  WBC 6.4 9.2 11.3* 15.2*  NEUTROABS 4.5  --   --   --   HGB 10.6* 12.0* 10.7* 11.0*  HCT 32.5* 36.3* 31.5* 33.5*  MCV 98.5 96.3 96.3 98.5  PLT 210 262 243 247    Basic Metabolic Panel: Recent Labs  Lab 05/10/23 1621 05/11/23 0300 05/12/23 0545 05/13/23 0338  NA 137 138 133* 134*  K 4.0 3.8 3.8 3.9  CL 102 103 105 107  CO2 25 23 17* 17*  GLUCOSE 103* 84 165* 125*  BUN 37* 32* 35* 44*  CREATININE 1.41* 1.40* 1.63* 2.44*  CALCIUM  9.8 9.4 8.5* 8.4*  MG  --  1.9 1.7 2.1  PHOS  --  3.2 4.6 4.6   GFR: Estimated Creatinine Clearance: 21.8 mL/min (A) (by C-G formula based on SCr of 2.44 mg/dL (H)). Recent Labs  Lab 05/10/23 1621 05/11/23 0300 05/12/23 0545 05/13/23 0338  WBC 6.4 9.2 11.3* 15.2*    Liver Function Tests: Recent Labs  Lab 05/10/23 1621  AST 30  ALT 22  ALKPHOS 57  BILITOT 1.0  PROT 7.1  ALBUMIN 4.0   No results for input(s): "LIPASE", "AMYLASE" in the last 168 hours. No results for input(s): "AMMONIA" in the last 168 hours.  ABG    Component Value Date/Time   TCO2 26 04/15/2008 2244     Coagulation Profile: Recent Labs  Lab 05/10/23 2309  INR 1.0    Cardiac Enzymes: Recent Labs  Lab  05/10/23 1621  CKTOTAL 292    HbA1C: Hemoglobin A1C  Date/Time Value Ref Range Status  02/02/2023 08:50 AM 7.8 (A) 4.0 - 5.6 % Final  09/15/2022 08:38 AM 7.3 (A) 4.0 - 5.6 % Final   HbA1c, POC (controlled diabetic range)  Date/Time Value Ref Range Status  01/04/2018 09:16 AM 6.4 0.0 - 7.0 % Final   Hgb A1c MFr Bld  Date/Time Value Ref Range Status  05/06/2021 11:21 AM 6.9 (H) <5.7 % of total Hgb Final    Comment:    For someone without known diabetes, a hemoglobin A1c value of 6.5% or greater indicates that they may have  diabetes and this should be confirmed with a follow-up  test. . For someone with known diabetes, a value  <7% indicates  that their diabetes is well controlled and a value  greater than or equal to 7% indicates suboptimal  control. A1c targets should be individualized based on  duration of diabetes, age, comorbid conditions, and  other considerations. . Currently, no consensus exists regarding use of hemoglobin A1c for diagnosis of diabetes for children. Aaron Aas   09/10/2020 09:25 AM 6.4 (H) <5.7 % of total Hgb Final    Comment:    For someone without known diabetes, a hemoglobin  A1c value between 5.7% and 6.4% is consistent with prediabetes and should be confirmed with a  follow-up test. . For someone with known diabetes, a value <7% indicates that their diabetes is well controlled. A1c targets should be individualized based on duration of diabetes, age, comorbid conditions, and other considerations. . This assay result is consistent with an increased risk of diabetes. . Currently, no consensus exists regarding use of hemoglobin A1c for diagnosis of diabetes for children. .     CBG: Recent Labs  Lab 05/12/23 1133 05/12/23 1621 05/12/23 1947 05/12/23 2329 05/13/23 0731  GLUCAP 131* 129* 121* 86 120*    Review of Systems:    10 point ROS conducted and is negative except as per HPI and subjective findings   Past Medical History  He,  has a past medical history of Arthritis, Atrial fibrillation (HCC), CHF (congestive heart failure) (HCC), CKD (chronic kidney disease), stage III (HCC), Complication of anesthesia, Diabetes mellitus without complication (HCC), GERD (gastroesophageal reflux disease), Hypertension, Hypothyroidism, and Nephrolithiasis.   Surgical History    Past Surgical History:  Procedure Laterality Date   ANTERIOR CERVICAL DECOMP/DISCECTOMY FUSION N/A 05/11/2023   Procedure: ANTERIOR CERVICAL DECOMPRESSION/DISCECTOMY FUSION 2 LEVELS;  Surgeon: Jodeen Munch, MD;  Location: [facility] ORS;  Service: Neurosurgery;  Laterality: N/A;   CATARACT EXTRACTION W/PHACO  Right 05/07/2021   Procedure: CATARACT EXTRACTION PHACO AND INTRAOCULAR LENS PLACEMENT (IOC) RIGHT DIABETIC 8.63 00:56.5;  Surgeon: Clair Crews, MD;  Location: [facility];  Service: Ophthalmology;  Laterality: Right;  Diabetic   CATARACT EXTRACTION W/PHACO Left 05/21/2021   Procedure: CATARACT EXTRACTION PHACO AND INTRAOCULAR LENS PLACEMENT (IOC) LEFT DIABETIC 7.27 01:01.5;  Surgeon: Clair Crews, MD;  Location: [facility];  Service: Ophthalmology;  Laterality: Left;  Diabetic   CORONARY STENT INTERVENTION N/A 03/08/2020   Procedure: CORONARY STENT INTERVENTION;  Surgeon: Antonette Batters, MD;  Location: [facility] INV[facility];  Service: Cardiovascular;  Laterality: N/A;   LEFT HEART CATH AND CORONARY ANGIOGRAPHY Left 03/08/2020   Procedure: LEFT HEART CATH AND CORONARY ANGIOGRAPHY;  Surgeon: Antonette Batters, MD;  Location: [facility] INVAS[facility] CV [facility];  Service: Cardiovascular;  Laterality: Left;   NASAL FRACTURE SURGERY  1994   URETEROLITHOTOMY Right 2015  Social History   reports that he has never smoked. He has never used smokeless tobacco. He reports that he does not drink alcohol and does not use drugs.   Family History   His family history includes Heart disease in his father.   Allergies Allergies  Allergen Reactions   Codeine Rash and Other (See Comments)    Per pt "hard on my kidneys"     Lovastatin Rash and Other (See Comments)   Tape Rash     Home Medications  Prior to Admission medications   Medication Sig Start Date End Date Taking? Authorizing Provider  acetaminophen  (TYLENOL ) 650 MG CR tablet Take 650 mg by mouth every 8 (eight) hours as needed for pain.   Yes [provider]  atorvastatin  (LIPITOR) 10 MG tablet Take 1 tablet (10 mg total) by mouth daily at 6 PM. 02/02/23  Yes Sowles, Krichna, MD  carvedilol  (COREG ) 3.125 MG tablet Take 1 tablet (3.125 mg total) by mouth 2 (two) times daily with a meal. 12/25/19 05/10/23 Yes Chatterjee,  Srobona Tublu, MD  clopidogrel  (PLAVIX ) 75 MG tablet Take 75 mg by mouth daily. 03/04/23  Yes [provider]  furosemide  (LASIX ) 40 MG tablet Take 40 mg by mouth daily. 05/03/23  Yes [provider]  glipiZIDE  2.5 MG TABS Take 1 tablet by mouth daily with breakfast. 02/02/23  Yes Sowles, Krichna, MD  levothyroxine  (SYNTHROID ) 25 MCG tablet TAKE 1 TABLET (25 MCG TOTAL) BY MOUTH DAILY BEFORE BREAKFAST. ON SUNDAYS, TAKE 2 TABLETS BY MOUTH BEFORE BREAKFAST. 04/27/23  Yes Sowles, Krichna, MD  losartan  (COZAAR ) 50 MG tablet Take 50 mg by mouth daily. 05/03/23  Yes [provider]  magnesium  oxide (MAG-OX) 400 MG tablet Take 400 mg by mouth daily.   Yes [provider]  metaxalone (SKELAXIN) 800 MG tablet May take 1/2-1 whole tablet up to 2 times daily as needed for pain/spasm. 04/14/22  Yes [provider]  metFORMIN  (GLUCOPHAGE -XR) 750 MG 24 hr tablet Take 2 tablets (1,500 mg total) by mouth daily with breakfast. 02/02/23  Yes Sowles, Krichna, MD  pantoprazole  (PROTONIX ) 40 MG tablet Take 1 tablet (40 mg total) by mouth daily. 11/10/22 11/05/23 Yes Brigitte Canard, PA-C  rOPINIRole  (REQUIP ) 1 MG tablet Take 1 tablet (1 mg total) by mouth 2 (two) times daily. 02/02/23  Yes Sowles, Krichna, MD  spironolactone (ALDACTONE) 25 MG tablet Take 12.5 mg by mouth daily.   Yes Callwood, Dwayne D, MD  triamcinolone  (KENALOG ) 0.1 % Apply 1 application topically 2 (two) times daily as needed (rash).   Yes [provider]  aluminum hydroxide-magnesium  carbonate (GAVISCON) 95-358 MG/15ML SUSP Take by mouth.    [provider]  blood glucose meter kit and supplies 1 each by Other route as directed. Dispense based on patient and insurance preference. Use up to four times daily as directed. (FOR ICD-10 E10.9, E11.9). 09/09/21   Sowles, Krichna, MD  Cholecalciferol (VITAMIN D -1000 MAX ST) 25 MCG (1000 UT) tablet Take 1 tablet by mouth daily.    [provider]   Cyanocobalamin (B-12) 1000 MCG TBCR Take 1,000 mcg by mouth every other day. 01/02/20   [provider]  dapagliflozin  propanediol (FARXIGA ) 10 MG TABS tablet Take by mouth. 01/10/22   [provider]  docusate sodium  (COLACE) 50 MG capsule Take 100 mg by mouth 2 (two) times daily as needed for mild constipation.    [provider]  fexofenadine-pseudoephedrine (ALLEGRA-D 24) 180-240 MG 24 hr tablet Take 1 tablet  by mouth daily as needed (allergies).    [provider]  furosemide  (LASIX ) 20 MG tablet Take 40 mg by mouth daily. Patient not taking: Reported on 05/10/2023 01/02/20   Callwood, Creig Doe D, MD  glucose blood (ONETOUCH ULTRA) test strip USE AS DIRECTED TO CHECK BLOOD GLUCOSE ONCE DAILY. 05/12/22   Sowles, Krichna, MD  Lancets [facility] DELICA PLUS [place]) MISC CHECK BLOOD SUGAR AT NOON 03/02/23   Sowles, Krichna, MD  losartan  (COZAAR ) 25 MG tablet Take 1 tablet (25 mg total) by mouth at bedtime. Patient not taking: Reported on 05/10/2023 12/25/19   Chatterjee, Srobona Tublu, MD  Nutritional Supplements (THERALITH XR PO) Take by mouth QID. 3.752-45-45-49.5mg . Take 2 in the evening and 2 at bedtime.    [provider]  polyethylene glycol powder (GLYCOLAX /MIRALAX ) 17 GM/SCOOP powder Take 17 g by mouth daily. 11/10/22   Brigitte Canard, PA-C  tamsulosin  (FLOMAX ) 0.4 MG CAPS capsule Take 1 capsule (0.4 mg total) by mouth every evening. 09/08/22   Geraline Knapp, MD        Assessment & Plan:  #Cervical Spine Injury in the setting of traumatic fall MRI Cervical spine shows chronic compression and stenosis at C3/4 and a new fracture/disc herniation Ligamentous injury with edema at C5/6 with moderate stenosis  -maintain C-Spine precaution -Start pressors for MAP augmentation goal >85 mm- currently on neo -Frequent neuro checks -PRN Pain management -Keep NPO -Hold anticoagulation -Neurosurgery consult- s/p surgery   #Chronic HFrEF/Ischemic  cardiomyopathy:  EF 40-45%  #Chronic A-Fib not on anticoagulation due to gross hematuria Hx:CAD s/p PCI and stent, HTN, HLD No vascular congestion on CXR yesterday. Appears euvolemic -Hold ? blockers /GDMT -will hold off on acei/arb/arni at this time to augment BP  -Continue Atorvastatin  once able to take po  #AKI on CKD Stage III -Monitor I&O's / urinary output -Follow BMP -Ensure adequate renal perfusion -Avoid nephrotoxic agents as able -Replace electrolytes as indicated   #Diabetes Mellitus -check hemoglobin A1c -CBGs q4 -Sliding scale insulin  -Follow ICU hyper/hypoglycemia protocol -Hold home Metformin  & Glipizide    #Hypothyroidism -Continue home synthroid   #RLS -Continue Requip   Best practice:  Diet:  NPO Pain/Anxiety/Delirium protocol (if indicated): No VAP protocol (if indicated): Not indicated DVT prophylaxis: Contraindicated GI prophylaxis: PPI Glucose control:  SSI Yes Central venous access:  N/A Arterial line:  N/A Foley:  N/A Mobility:  bed rest  PT consulted: N/A Last date of multidisciplinary goals of care discussion [updated family at the bedside] Code Status:  full code Disposition: ICU   = Goals of Care = Code Status Order: full  Primary Emergency ContactDeavion, Dobbs, Home Phone: [phone] Patient and family wishes to pursue full aggressive treatment and intervention options, including CPR and intubation  Critical care provider statement:   Total critical care time: 64 minutes   Performed by: Jaclynn Mast MD   Critical care time was exclusive of separately billable procedures and treating other patients.   Critical care was necessary to treat or prevent imminent or life-threatening deterioration.   Critical care was time spent personally by me on the following activities: development of treatment plan with patient and/or surrogate as well as nursing, discussions with consultants, evaluation of patient's response to treatment,  examination of patient, obtaining history from patient or surrogate, ordering and performing treatments and interventions, ordering and review of laboratory studies, ordering and review of radiographic studies, pulse oximetry and re-evaluation of patient's condition.    Christie Copley, M.D.  Pulmonary & Critical Care Medicine

## 2023-05-14 LAB — BASIC METABOLIC PANEL WITH GFR
Anion gap: 16 — ABNORMAL HIGH (ref 5–15)
BUN: 47 mg/dL — ABNORMAL HIGH (ref 8–23)
CO2: 21 mmol/L — ABNORMAL LOW (ref 22–32)
Calcium: 8.6 mg/dL — ABNORMAL LOW (ref 8.9–10.3)
Chloride: 105 mmol/L (ref 98–111)
Creatinine, Ser: 2.03 mg/dL — ABNORMAL HIGH (ref 0.61–1.24)
GFR, Estimated: 32 mL/min — ABNORMAL LOW (ref >60)
Glucose, Bld: 285 mg/dL — ABNORMAL HIGH (ref 70–99)
Potassium: 3.6 mmol/L (ref 3.5–5.1)
Sodium: 139 mmol/L (ref 135–145)

## 2023-05-14 LAB — GLUCOSE, CAPILLARY
Glucose-Capillary: 272 mg/dL — ABNORMAL HIGH (ref 70–99)
Glucose-Capillary: 214 mg/dL — ABNORMAL HIGH (ref 70–99)
Glucose-Capillary: 112 mg/dL — ABNORMAL HIGH (ref 70–99)
Glucose-Capillary: 156 mg/dL — ABNORMAL HIGH (ref 70–99)
Glucose-Capillary: 162 mg/dL — ABNORMAL HIGH (ref 70–99)
Glucose-Capillary: 138 mg/dL — ABNORMAL HIGH (ref 70–99)

## 2023-05-14 LAB — CBC
HCT: 33.8 % — ABNORMAL LOW (ref 39.0–52.0)
Hemoglobin: 11.1 g/dL — ABNORMAL LOW (ref 13.0–17.0)
MCH: 32.3 pg (ref 26.0–34.0)
MCHC: 32.8 g/dL (ref 30.0–36.0)
MCV: 98.3 fL (ref 80.0–100.0)
Platelets: 272 10*3/uL (ref 150–400)
RBC: 3.44 MIL/uL — ABNORMAL LOW (ref 4.22–5.81)
RDW: 13.1 % (ref 11.5–15.5)
WBC: 13.1 10*3/uL — ABNORMAL HIGH (ref 4.0–10.5)
nRBC: 0 % (ref 0.0–0.2)

## 2023-05-14 LAB — PHOSPHORUS: Phosphorus: 3.5 mg/dL (ref 2.5–4.6)

## 2023-05-14 LAB — MAGNESIUM: Magnesium: 2.3 mg/dL (ref 1.7–2.4)

## 2023-05-14 LAB — TRIGLYCERIDES: Triglycerides: 107 mg/dL (ref <150)

## 2023-05-14 MED ORDER — INSULIN GLARGINE-YFGN 100 UNIT/ML ~~LOC~~ SOLN
5.0000 [IU] | Freq: Two times a day (BID) | SUBCUTANEOUS | Status: DC
  Administered 2023-05-14 – 2023-05-19 (×10): 5 [IU] via SUBCUTANEOUS
  Filled 2023-05-14 (×13): qty 0.05

## 2023-05-14 MED ORDER — STERILE WATER FOR INJECTION IV SOLN
INTRAVENOUS | Status: DC
  Filled 2023-05-14 (×2): qty 150
  Filled 2023-05-14: qty 1000
  Filled 2023-05-14: qty 150
  Filled 2023-05-14: qty 1000

## 2023-05-14 NOTE — Progress Notes (Signed)
 [facility] CLINIC CARDIOLOGY PROGRESS NOTE       Patient ID: Billy Franco MRN: [id number] DOB/AGE: [birth year] 83 y.o.  Admit date: 05/10/2023 Referring Physician Dr. Duayne Gey Primary Physician Ava Lei, Arvis Laura, MD Primary Cardiologist Dr. Beau Bound Reason for Consultation Cardiac Clearance   HPI: Billy Franco is a 83 y.o. male  with a past medical history of CAD s/p stent, chronic systolic congestive heart failure (EF 25% - 30% - 04/2020), Ischemic Cardiomyopathy (refuses AICD), AF RVR (not on Eliquis  due to hematuria), T2DM, CKD, hypothyroidism who presented to the ED on 05/10/2023 for falling out of a chair on Friday. Patient is requiring surgical intervention and needs cardiac clearance. Cardiology was consulted for further evaluation.   Interval history: -Patient seen and examined this AM, intubated and sedated. Had aspiration event yesterday afternoon requiring intubation. -HR improved this AM. Had bradycardia yesterday afternoon. Frequent PVCs noted on tele.  -Cr improved today.   Review of systems complete and found to be negative unless listed above    Past Medical History:  Diagnosis Date   Arthritis    Lower back, hips   Atrial fibrillation (HCC)    CHF (congestive heart failure) (HCC)    CKD (chronic kidney disease), stage III (HCC)    Complication of anesthesia    has "crooked airway"  Diff breathing after extubated (after kidney stone procedure 07/2013)   Diabetes mellitus without complication (HCC)    GERD (gastroesophageal reflux disease)    Hypertension    Hypothyroidism    Nephrolithiasis     Past Surgical History:  Procedure Laterality Date   ANTERIOR CERVICAL DECOMP/DISCECTOMY FUSION N/A 05/11/2023   Procedure: ANTERIOR CERVICAL DECOMPRESSION/DISCECTOMY FUSION 2 LEVELS;  Surgeon: Jodeen Munch, MD;  Location: [facility] ORS;  Service: Neurosurgery;  Laterality: N/A;   CATARACT EXTRACTION W/PHACO Right 05/07/2021   Procedure: CATARACT EXTRACTION PHACO AND  INTRAOCULAR LENS PLACEMENT (IOC) RIGHT DIABETIC 8.63 00:56.5;  Surgeon: Clair Crews, MD;  Location: [facility];  Service: Ophthalmology;  Laterality: Right;  Diabetic   CATARACT EXTRACTION W/PHACO Left 05/21/2021   Procedure: CATARACT EXTRACTION PHACO AND INTRAOCULAR LENS PLACEMENT (IOC) LEFT DIABETIC 7.27 01:01.5;  Surgeon: Clair Crews, MD;  Location: [facility];  Service: Ophthalmology;  Laterality: Left;  Diabetic   CORONARY STENT INTERVENTION N/A 03/08/2020   Procedure: CORONARY STENT INTERVENTION;  Surgeon: Antonette Batters, MD;  Location: [facility] INVASIVE CV [facility];  Service: Cardiovascular;  Laterality: N/A;   LEFT HEART CATH AND CORONARY ANGIOGRAPHY Left 03/08/2020   Procedure: LEFT HEART CATH AND CORONARY ANGIOGRAPHY;  Surgeon: Antonette Batters, MD;  Location: [facility] INVASIVE CV LAB;  Service: Cardiovascular;  Laterality: Left;   NASAL FRACTURE SURGERY  1994   URETEROLITHOTOMY Right 2015    Medications Prior to Admission  Medication Sig Dispense Refill Last Dose/Taking   acetaminophen  (TYLENOL ) 650 MG CR tablet Take 650 mg by mouth every 8 (eight) hours as needed for pain.   05/10/2023 Morning   aluminum hydroxide-magnesium  carbonate (GAVISCON) 95-358 MG/15ML SUSP Take by mouth.   Taking   atorvastatin  (LIPITOR) 10 MG tablet Take 1 tablet (10 mg total) by mouth daily at 6 PM. 90 tablet 1 05/09/2023 Evening   carvedilol  (COREG ) 3.125 MG tablet Take 1 tablet (3.125 mg total) by mouth 2 (two) times daily with a meal. 60 tablet 0 05/10/2023 Morning   Cholecalciferol (VITAMIN D -1000 MAX ST) 25 MCG (1000 UT) tablet Take 1 tablet by mouth daily.   Taking   clopidogrel  (PLAVIX ) 75 MG tablet Take 75 mg by  mouth daily.   05/10/2023 Morning   Cyanocobalamin (B-12) 1000 MCG TBCR Take 1,000 mcg by mouth every other day.   05/10/2023 Morning   docusate sodium  (COLACE) 50 MG capsule Take 100 mg by mouth 2 (two) times daily as needed for mild constipation.   Unknown    fexofenadine-pseudoephedrine (ALLEGRA-D 24) 180-240 MG 24 hr tablet Take 1 tablet by mouth daily as needed (allergies).   Unknown   furosemide  (LASIX ) 40 MG tablet Take 40 mg by mouth daily.   05/10/2023 Morning   glipiZIDE  2.5 MG TABS Take 1 tablet by mouth daily with breakfast. 90 tablet 1 Taking   levothyroxine  (SYNTHROID ) 25 MCG tablet TAKE 1 TABLET (25 MCG TOTAL) BY MOUTH DAILY BEFORE BREAKFAST. ON SUNDAYS, TAKE 2 TABLETS BY MOUTH BEFORE BREAKFAST. 102 tablet 1 05/10/2023 Morning   losartan  (COZAAR ) 25 MG tablet Take 1 tablet (25 mg total) by mouth at bedtime. 30 tablet 0 05/09/2023 Bedtime   magnesium  oxide (MAG-OX) 400 MG tablet Take 400 mg by mouth daily.   05/09/2023 Evening   metaxalone (SKELAXIN) 800 MG tablet Take 400 mg by mouth 2 (two) times daily.   05/09/2023 Bedtime   metFORMIN  (GLUCOPHAGE -XR) 750 MG 24 hr tablet Take 2 tablets (1,500 mg total) by mouth daily with breakfast. 180 tablet 1 05/10/2023 Morning   Nutritional Supplements (THERALITH XR PO) Take by mouth QID. 3.752-45-45-49.5mg . Take 2 in the evening and 2 at bedtime.   05/10/2023 Morning   pantoprazole  (PROTONIX ) 40 MG tablet Take 1 tablet (40 mg total) by mouth daily. 90 tablet 3 05/09/2023 Evening   polyethylene glycol powder (GLYCOLAX /MIRALAX ) 17 GM/SCOOP powder Take 17 g by mouth daily.   Unknown   rOPINIRole  (REQUIP ) 1 MG tablet Take 1 tablet (1 mg total) by mouth 2 (two) times daily. 180 tablet 1 05/09/2023 Bedtime   spironolactone (ALDACTONE) 25 MG tablet Take 12.5 mg by mouth daily.   05/10/2023   tamsulosin  (FLOMAX ) 0.4 MG CAPS capsule Take 1 capsule (0.4 mg total) by mouth every evening. 90 capsule 2 05/09/2023 Evening   triamcinolone  (KENALOG ) 0.1 % Apply 1 application topically 2 (two) times daily as needed (rash).   Unknown   blood glucose meter kit and supplies 1 each by Other route as directed. Dispense based on patient and insurance preference. Use up to four times daily as directed. (FOR ICD-10 E10.9, E11.9). 1 each 1     glucose blood (ONETOUCH ULTRA) test strip USE AS DIRECTED TO CHECK BLOOD GLUCOSE ONCE DAILY. 100 strip 3    Lancets (ONETOUCH DELICA PLUS LANCET33G) MISC CHECK BLOOD SUGAR AT NOON 100 each 1    Social History   Socioeconomic History   Marital status: Married    Spouse name: Sport and exercise psychologist    Number of children: 3   Years of education: Not on file   Highest education level: Not on file  Occupational History   Occupation: retired     Comment: IT trainer at a [employer]   Tobacco Use   Smoking status: Never   Smokeless tobacco: Never  Vaping Use   Vaping status: Never Used  Substance and Sexual Activity   Alcohol use: No    Alcohol/week: 0.0 standard drinks of alcohol   Drug use: No   Sexual activity: Not Currently  Other Topics Concern   Not on file  Social History Narrative   Married, had 3 children, one died at a MVA at age 38   [employer] of a church    Social Drivers  of Health   Financial Resource Strain: Low Risk  (01/24/2019)   Overall Financial Resource Strain (CARDIA)    Difficulty of Paying Living Expenses: Not hard at all  Food Insecurity: No Food Insecurity (05/11/2023)   Hunger Vital Sign    Worried About Running Out of Food in the Last Year: Never true    Ran Out of Food in the Last Year: Never true  Transportation Needs: No Transportation Needs (05/11/2023)   PRAPARE - Administrator, Civil Service (Medical): No    Lack of Transportation (Non-Medical): No  Physical Activity: Sufficiently Active (01/24/2019)   Exercise Vital Sign    Days of Exercise per Week: 5 days    Minutes of Exercise per Session: 60 min  Stress: No Stress Concern Present (01/24/2019)   [employer] of Occupational Health - Occupational Stress Questionnaire    Feeling of Stress : Not at all  Social Connections: Unknown (05/11/2023)   Social Connection and Isolation Panel [NHANES]    Frequency of Communication with Friends and Family: More than three times a week    Frequency of  Social Gatherings with Friends and Family: More than three times a week    Attends Religious Services: More than 4 times per year    Active Member of [employer] or Organizations: Not on file    Attends Banker Meetings: Not on file    Marital Status: Married  Intimate Partner Violence: Not At Risk (05/11/2023)   Humiliation, Afraid, Rape, and Kick questionnaire    Fear of Current or Ex-Partner: No    Emotionally Abused: No    Physically Abused: No    Sexually Abused: No    Family History  Problem Relation Age of Onset   Heart disease Father      Vitals:   05/14/23 0615 05/14/23 0630 05/14/23 0645 05/14/23 0700  BP:      Pulse: (!) 46 70 70 69  Resp: (!) 5 (!) 8 11 18   Temp:      TempSrc:      SpO2: 100% 100% 100% 100%  Weight:      Height:        PHYSICAL EXAM General: Ill appearing elderly male, intubated and sedated. HEENT: Normocephalic and atraumatic. Neck: No JVD.   Lungs: Mechanical breath sounds. Heart: HRRR. Normal S1 and S2 without gallops or murmurs.  Abdomen: Non-distended appearing.  Msk: Normal strength and tone for age. Extremities: Warm and well perfused. No clubbing, cyanosis. No edema.    Labs: Basic Metabolic Panel: Recent Labs    05/13/23 0338 05/13/23 1605 05/14/23 0434  NA 134* 131* 139  K 3.9 4.0 3.6  CL 107 103 105  CO2 17* 17* 21*  GLUCOSE 125* 317* 285*  BUN 44* 44* 47*  CREATININE 2.44* 2.60* 2.03*  CALCIUM  8.4* 7.9* 8.6*  MG 2.1 2.1 2.3  PHOS 4.6  --  3.5   Liver Function Tests: Recent Labs    05/13/23 1605  AST 40  ALT 13  ALKPHOS 49  BILITOT 1.0  PROT 5.8*  ALBUMIN 2.8*   No results for input(s): "LIPASE", "AMYLASE" in the last 72 hours. CBC: Recent Labs    05/13/23 1605 05/14/23 0434  WBC 11.6* 13.1*  HGB 10.2* 11.1*  HCT 32.3* 33.8*  MCV 101.3* 98.3  PLT 212 272   Cardiac Enzymes: Recent Labs    05/13/23 1605 05/13/23 1812  TROPONINIHS 356* 339*   BNP: No results for input(s): "BNP" in the  last 72 hours. D-Dimer: No results for input(s): "DDIMER" in the last 72 hours. Hemoglobin A1C: No results for input(s): "HGBA1C" in the last 72 hours. Fasting Lipid Panel: Recent Labs    05/14/23 0434  TRIG 107   Thyroid  Function Tests: No results for input(s): "TSH", "T4TOTAL", "T3FREE", "THYROIDAB" in the last 72 hours.  Invalid input(s): "FREET3" Anemia Panel: No results for input(s): "VITAMINB12", "FOLATE", "FERRITIN", "TIBC", "IRON", "RETICCTPCT" in the last 72 hours.   Radiology: CT CHEST WO CONTRAST Result Date: 05/13/2023 CLINICAL DATA:  Spinal cord injury aspiration follow-up EXAM: CT CHEST WITHOUT CONTRAST TECHNIQUE: Multidetector CT imaging of the chest was performed following the standard protocol without IV contrast. RADIATION DOSE REDUCTION: This exam was performed according to the departmental dose-optimization program which includes automated exposure control, adjustment of the mA and/or kV according to patient size and/or use of iterative reconstruction technique. COMPARISON:  Radiograph 05/13/2023 FINDINGS: Cardiovascular: Limited evaluation without intravenous contrast. Mild aortic atherosclerosis. No aneurysm. Left upper extremity central venous catheter with tip at the cavoatrial junction. Coronary vascular calcification. Cardiomegaly. Trace pericardial effusion Mediastinum/Nodes: Endotracheal tube with tip several cm superior to the carina. Mild debris within the distal trachea and right bronchus. Multiple calcified lymph nodes consistent with prior granulomatous disease. Esophagus is unremarkable Lungs/Pleura: Trace left and small moderate right pleural effusions. Mild dependent atelectasis in the lower lobes. No pneumothorax Upper Abdomen: No acute finding. Left renal pelvis stone measuring about 21 mm. Musculoskeletal: Sternum appears intact. No acute osseous abnormality. IMPRESSION: 1. Small left and small to moderate right pleural effusions. Mild dependent atelectasis  in the lungs. Mild debris or secretions in the distal trachea and right bronchus. 2. Cardiomegaly.  Trace pericardial effusion. 3. Evidence of prior granulomatous disease. 4. Left kidney stone Aortic Atherosclerosis (ICD10-I70.0). Electronically Signed   By: Esmeralda Hedge M.D.   On: 05/13/2023 23:16   CT CERVICAL SPINE WO CONTRAST Result Date: 05/13/2023 CLINICAL DATA:  Altered mental status EXAM: CT HEAD WITHOUT CONTRAST CT CERVICAL SPINE WITHOUT CONTRAST TECHNIQUE: Multidetector CT imaging of the head and cervical spine was performed following the standard protocol without intravenous contrast. Multiplanar CT image reconstructions of the cervical spine were also generated. RADIATION DOSE REDUCTION: This exam was performed according to the departmental dose-optimization program which includes automated exposure control, adjustment of the mA and/or kV according to patient size and/or use of iterative reconstruction technique. COMPARISON:  05/10/2023 FINDINGS: CT HEAD FINDINGS Brain: No mass,hemorrhage or extra-axial collection. Unchanged hypodensity within the right PCA territory. Vascular: No hyperdense vessel or unexpected vascular calcification. Skull: The visualized skull base, calvarium and extracranial soft tissues are normal. Sinuses/Orbits: No fluid levels or advanced mucosal thickening of the visualized paranasal sinuses. No mastoid or middle ear effusion. Normal orbits. Other: None. CT CERVICAL SPINE FINDINGS Alignment: No static subluxation. Facets are aligned. Occipital condyles are normally positioned. Skull base and vertebrae: C3-5 ACDF.  No acute fracture. Soft tissues and spinal canal: No prevertebral fluid or swelling. No visible canal hematoma. Disc levels: C2-3: Moderate spinal canal stenosis. C3-4: Severe spinal canal stenosis. C4-5: Mild spinal canal stenosis. C5-6: Unremarkable. C6-7: Mild spinal canal stenosis. Upper chest: No pneumothorax, pulmonary nodule or pleural effusion. Other:  Normal visualized paraspinal cervical soft tissues. IMPRESSION: 1. No acute intracranial abnormality. 2. No acute fracture or static subluxation of the cervical spine. 3. 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 Electronically Signed   By: Juanetta Nordmann M.D.   On: 05/13/2023 22:59   CT HEAD WO CONTRAST (  ) Result Date: 05/13/2023 CLINICAL DATA:  Altered mental status EXAM: CT HEAD WITHOUT CONTRAST CT CERVICAL SPINE WITHOUT CONTRAST TECHNIQUE: Multidetector CT imaging of the head and cervical spine was performed following the standard protocol without intravenous contrast. Multiplanar CT image reconstructions of the cervical spine were also generated. RADIATION DOSE REDUCTION: This exam was performed according to the departmental dose-optimization program which includes automated exposure control, adjustment of the mA and/or kV according to patient size and/or use of iterative reconstruction technique. COMPARISON:  05/10/2023 FINDINGS: CT HEAD FINDINGS Brain: No mass,hemorrhage or extra-axial collection. Unchanged hypodensity within the right PCA territory. Vascular: No hyperdense vessel or unexpected vascular calcification. Skull: The visualized skull base, calvarium and extracranial soft tissues are normal. Sinuses/Orbits: No fluid levels or advanced mucosal thickening of the visualized paranasal sinuses. No mastoid or middle ear effusion. Normal orbits. Other: None. CT CERVICAL SPINE FINDINGS Alignment: No static subluxation. Facets are aligned. Occipital condyles are normally positioned. Skull base and vertebrae: C3-5 ACDF.  No acute fracture. Soft tissues and spinal canal: No prevertebral fluid or swelling. No visible canal hematoma. Disc levels: C2-3: Moderate spinal canal stenosis. C3-4: Severe spinal canal stenosis. C4-5: Mild spinal canal stenosis. C5-6: Unremarkable. C6-7: Mild spinal canal stenosis. Upper chest: No pneumothorax, pulmonary nodule or pleural effusion. Other: Normal  visualized paraspinal cervical soft tissues. IMPRESSION: 1. No acute intracranial abnormality. 2. No acute fracture or static subluxation of the cervical spine. 3. 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 Electronically Signed   By: Juanetta Nordmann M.D.   On: 05/13/2023 22:59   DG Chest Port 1 View Result Date: 05/13/2023 CLINICAL DATA:  Status post intubation EXAM: PORTABLE CHEST 1 VIEW COMPARISON:  May 10, 2023 FINDINGS: The heart size and mediastinal contours are within normal limits. Both lungs are clear. The visualized skeletal structures are unremarkable. Tip of the endotracheal tube 2 cm from carina Left PICC line tip in the SVC IMPRESSION: No active disease. Electronically Signed   By: Fredrich Jefferson M.D.   On: 05/13/2023 16:14   US  EKG SITE RITE Result Date: 05/12/2023 If [facility] image not attached, placement could not be confirmed due to current cardiac rhythm.  DG Cervical Spine 2-3 Views Result Date: 05/11/2023 CLINICAL DATA:  Elective surgery. EXAM: CERVICAL SPINE - 2-3 VIEW COMPARISON:  Preoperative imaging FINDINGS: Two fluoroscopic spot views of the cervical spine submitted from the operating room. Anterior fusion C3 through C5 with interbody spacers. Fluoroscopy time 4 seconds. Dose 0.29 mGy. IMPRESSION: Intraoperative fluoroscopy during cervical spine fusion. Electronically Signed   By: Chadwick Colonel M.D.   On: 05/11/2023 17:04   DG C-Arm 1-60 Min-No Report Result Date: 05/11/2023 Fluoroscopy was utilized by the requesting physician.  No radiographic interpretation.   DG C-Arm 1-60 Min-No Report Result Date: 05/11/2023 Fluoroscopy was utilized by the requesting physician.  No radiographic interpretation.   MR Cervical Spine Wo Contrast Result Date: 05/10/2023 CLINICAL DATA:  Ataxia, cervical trauma arm weakness, eval central cord EXAM: MRI CERVICAL SPINE WITHOUT CONTRAST TECHNIQUE: Multiplanar, multisequence MR imaging of the cervical spine was performed. No  intravenous contrast was administered. COMPARISON:  None Available. FINDINGS: Alignment: No substantial sagittal subluxation. Vertebrae: No bone marrow edema to suggest acute fracture. Edema in the anterior C6-C7 disc space with extension anteriorly and destruction of the anterior longitudinal ligament (see series 3, image 8). Adjacent prevertebral edema which extends inferiorly into the upper thoracic spine. Cord: T2 hyperintensity within the cord at C3-C4. Posterior Fossa, vertebral arteries, paraspinal  tissues: The visualized 2 artery flow voids are maintained. Small volume of prevertebral edema in the lower cervical spine extending into the upper thoracic spine. Disc levels: C2-C3: Small posterior disc osteophyte complex. Mild canal stenosis. Bilateral facet uncovertebral hypertrophy with mild left foraminal stenosis. C3-C4: Posterior disc osteophyte complex and ligamentum flavum thickening. Resulting severe canal stenosis with cord compression. Bilateral facet uncovertebral hypertrophy. Resulting severe bilateral foraminal stenosis. C4-C5: Posterior disc osteophyte complex and ligamentum flavum thickening. Resulting moderate to severe canal stenosis. Bilateral facet and vertebral hypertrophy with severe bilateral foraminal stenosis. C5-C6: Segmentation anomaly. Partial fusion across the disc. Bilateral facet uncovertebral hypertrophy. Canal and foramina are patent. C6-C7: Posterior disc osteophyte complex with central disc protrusion. Bilateral facet and uncovertebral hypertrophy. Moderate bilateral foraminal stenosis. Mild to moderate canal stenosis. C7-T1: Uncovertebral hypertrophy without significant canal or foraminal stenosis. IMPRESSION: 1. 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 Edema extends posteriorly to involve the C6-C7 disc, compatible with traumatic disc injury. Small volume of adjacent prevertebral edema. 2. At C3-C4, severe canal stenosis with cord compression. T2  hyperintensity in the cord at this level is compatible with cord edema and/or myelomalacia. Severe bilateral foraminal stenosis at this level. 3. At C4-C5, moderate to severe canal stenosis and severe bilateral foraminal stenosis. 4. At C6-C7, mild to moderate canal stenosis and moderate bilateral foraminal stenosis. Electronically Signed   By: Stevenson Elbe M.D.   On: 05/10/2023 20:41   MR BRAIN WO CONTRAST Result Date: 05/10/2023 CLINICAL DATA:  Neuro deficit, acute, stroke suspected left sided weakness EXAM: MRI HEAD WITHOUT CONTRAST TECHNIQUE: Multiplanar, multiecho pulse sequences of the brain and surrounding structures were obtained without intravenous contrast. COMPARISON:  CT head and CT cervical spine from earlier today. FINDINGS: Brain: No acute infarction, hemorrhage, hydrocephalus, extra-axial collection or mass lesion. Remote right PCA territory infarct. Vascular: Major arterial flow voids are maintained at the skull base. Skull and upper cervical spine: Normal marrow signal. Sinuses/Orbits: Negative. IMPRESSION: 1. No evidence of acute intracranial abnormality. 2. Remote right PCA territory infarct. Electronically Signed   By: Stevenson Elbe M.D.   On: 05/10/2023 20:22   CT Thoracic Spine Wo Contrast Result Date: 05/10/2023 CLINICAL DATA:  Back trauma, fall from chair. EXAM: CT THORACIC AND LUMBAR SPINE WITHOUT CONTRAST TECHNIQUE: Multidetector CT imaging of the thoracic and lumbar spine was performed without contrast. Multiplanar CT image reconstructions were also generated. RADIATION DOSE REDUCTION: This exam was performed according to the departmental dose-optimization program which includes automated exposure control, adjustment of the mA and/or kV according to patient size and/or use of iterative reconstruction technique. COMPARISON:  10/08/2022, 04/28/2022 FINDINGS: CT THORACIC SPINE FINDINGS Alignment: Normal. Vertebrae: No acute fracture or focal pathologic process. Paraspinal and  other soft tissues: No paraspinal fat stranding or epidural hematoma. Aortic atherosclerosis is noted. There are multi-vessel coronary artery calcifications. Pleural and parenchymal scarring is noted at the lung apices. There are trace bilateral pleural effusions with atelectasis. Disc levels: Multilevel degenerative endplate changes. There is calcification of the supraspinous ligament. CT LUMBAR SPINE FINDINGS Segmentation: 5 lumbar type vertebrae. Alignment: Normal. Vertebrae: There is a old fracture of the L1, L2, and L3 transverse processes on the left with bone callus formation. No acute fracture is seen. Paraspinal and other soft tissues: No acute abnormality. Aortic atherosclerosis is seen. Left renal calculi are noted. Disc levels: Multi level degenerative endplate changes and facet arthropathy. There is a disc herniation at L4-L5 resulting in severe spinal canal stenosis and moderate to severe neural foraminal  stenosis bilaterally, greater on the left than on the right. The spinal canal and neural foramina are otherwise patent. IMPRESSION: CT THORACIC SPINE IMPRESSION 1. Multilevel degenerative changes without evidence of acute fracture. 2. Trace bilateral pleural effusions with atelectasis. 3. Aortic atherosclerosis with coronary artery calcifications. CT LUMBAR SPINE IMPRESSION 1. 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 2. Multilevel degenerative changes in the lumbar spine as described above. 3. Left renal calculi. Electronically Signed   By: Wyvonnia Heimlich M.D.   On: 05/10/2023 20:01   CT Lumbar Spine Wo Contrast Result Date: 05/10/2023 CLINICAL DATA:  Back trauma, fall from chair. EXAM: CT THORACIC AND LUMBAR SPINE WITHOUT CONTRAST TECHNIQUE: Multidetector CT imaging of the thoracic and lumbar spine was performed without contrast. Multiplanar CT image  reconstructions were also generated. RADIATION DOSE REDUCTION: This exam was performed according to the departmental dose-optimization program which includes automated exposure control, adjustment of the mA and/or kV according to patient size and/or use of iterative reconstruction technique. COMPARISON:  10/08/2022, 04/28/2022 FINDINGS: CT THORACIC SPINE FINDINGS Alignment: Normal. Vertebrae: No acute fracture or focal pathologic process. Paraspinal and other soft tissues: No paraspinal fat stranding or epidural hematoma. Aortic atherosclerosis is noted. There are multi-vessel coronary artery calcifications. Pleural and parenchymal scarring is noted at the lung apices. There are trace bilateral pleural effusions with atelectasis. Disc levels: Multilevel degenerative endplate changes. There is calcification of the supraspinous ligament. CT LUMBAR SPINE FINDINGS Segmentation: 5 lumbar type vertebrae. Alignment: Normal. Vertebrae: There is a old fracture of the L1, L2, and L3 transverse processes on the left with bone callus formation. No acute fracture is seen. Paraspinal and other soft tissues: No acute abnormality. Aortic atherosclerosis is seen. Left renal calculi are noted. Disc levels: Multi level degenerative endplate changes and facet arthropathy. There is a disc herniation at L4-L5 resulting in severe spinal canal stenosis and moderate to severe neural foraminal stenosis bilaterally, greater on the left than on the right. The spinal canal and neural foramina are otherwise patent. IMPRESSION: CT THORACIC SPINE IMPRESSION 1. Multilevel degenerative changes without evidence of acute fracture. 2. Trace bilateral pleural effusions with atelectasis. 3. Aortic atherosclerosis with coronary artery calcifications. CT LUMBAR SPINE IMPRESSION 1. Disc herniation with facet arthropathy and degenerative endplate changes at L4-L5 resulting in severe spinal canal stenosis and moderate-to-severe neural foraminal stenosis at  this level. 2. Multilevel degenerative changes in the lumbar spine as described above. 3. Left renal calculi. Electronically Signed   By: Wyvonnia Heimlich M.D.   On: 05/10/2023 20:01   CT Cervical Spine Wo Contrast Result Date: 05/10/2023 CLINICAL DATA:  Fall, weakness EXAM: CT CERVICAL SPINE WITHOUT CONTRAST TECHNIQUE: Multidetector CT imaging of the cervical spine was performed without intravenous contrast. Multiplanar CT image reconstructions were also generated. RADIATION DOSE REDUCTION: This exam was performed according to the departmental dose-optimization program which includes automated exposure control, adjustment of the mA and/or kV according to patient size and/or use of iterative reconstruction technique. COMPARISON:  None Available. FINDINGS: Alignment: No subluxation.  Facet alignment within normal limits. Skull base and vertebrae: No fracture Soft tissues and spinal canal: No prevertebral fluid or swelling. No visible canal hematoma. Disc levels: Prominent multilevel anterior osteophytes. Fusion of the C5-C6 vertebral bodies and posterior elements. Multilevel degenerative changes with osteophytes and mild disc space narrowing. Small central disc protrusion at C2-C3. At least mild canal stenosis. Posterior disc osteophyte complex at  C3-C4 with moderate severe canal stenosis. Posterior disc osteophyte C4-C5 with at least moderate canal stenosis. Posterior disc osteophyte C6-C7 with at least mild canal stenosis and mild mass effect on the anterior thecal sac. Multilevel facet degenerative change. Multilevel foraminal narrowing. Upper chest: Apical scarring Other: None IMPRESSION: 1. Negative for acute fracture or subluxation of the cervical spine. 2. 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 Electronically Signed   By: Esmeralda Hedge M.D.   On: 05/10/2023 17:03   DG Chest Port 1 View Result Date: 05/10/2023 CLINICAL DATA:  Weakness EXAM: PORTABLE CHEST  1 VIEW COMPARISON:  12/21/2019 FINDINGS: Pleural-parenchymal scarring at the apices. No consolidation, pleural effusion or pneumothorax. Borderline to mild cardiomegaly. Aortic atherosclerosis. IMPRESSION: No active disease. Borderline to mild cardiomegaly. Electronically Signed   By: Esmeralda Hedge M.D.   On: 05/10/2023 16:55   CT Head Wo Contrast Result Date: 05/10/2023 CLINICAL DATA:  Head trauma weakness EXAM: CT HEAD WITHOUT CONTRAST TECHNIQUE: Contiguous axial images were obtained from the base of the skull through the vertex without intravenous contrast. RADIATION DOSE REDUCTION: This exam was performed according to the departmental dose-optimization program which includes automated exposure control, adjustment of the mA and/or kV according to patient size and/or use of iterative reconstruction technique. COMPARISON:  CT brain 05/02/2019 FINDINGS: Brain: Mild motion degradation limits the exam. Interval small chronic appearing right occipital infarct. Mild atrophy. Patchy white matter hypodensity most likely chronic small vessel ischemic change. The ventricles are non enlarged. Vascular: No hyperdense vessels.  No unexpected calcification Skull: No definitive fracture but limited by motion Sinuses/Orbits: No acute finding. Other: None IMPRESSION: 1. Mild motion degradation limits the exam. No definite CT evidence for acute intracranial abnormality. 2. Atrophy and chronic small vessel ischemic changes of the white matter. Interval small chronic appearing right occipital infarct, new compared to 2021 head CT. Electronically Signed   By: Esmeralda Hedge M.D.   On: 05/10/2023 16:53    ECHO: 05/14/2020  MODERATE LV SYSTOLIC DYSFUNCTION  MILD RV SYSTOLIC DYSFUNCTION  NO VALVULAR STENOSIS  MODERATE MR, TR  MILD AR, PR  EF 30%   TELEMETRY reviewed by me 05/14/2023: Sinus rhythm PVCs rate 70s  EKG reviewed by me: Sinus rhythm with PACs, rate 67 bpm  Data reviewed by me 05/14/2023: last 24h vitals tele  labs imaging I/O ED provider note, admission H&P, neurosurgery, critical care notes.  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 male  with a past medical history of CAD s/p stent (2022), chronic systolic congestive heart failure (EF 25% - 30% - 04/2020), Ischemic Cardiomyopathy (refuses AICD), AF RVR (not on Eliquis  due to hematuria), T2DM, CKD, hypothyroidism who presented to the ED on 05/10/2023 for falling out of a chair on Friday. Patient is requiring surgical intervention and needs cardiac clearance. Cardiology was consulted for further evaluation.   # Aspiration # Fall, Fracture/disc herniation at C5/6 with moderate stenosis # Paroxysmal Atrial fibrillation # Chronic systolic congestive heart failure (EF 25% - 30% - 04/2020)  # Ischemic Cardiomyopathy (refuses AICD) # CAD s/p stent placement Patient presents to the ED after falling from chair on Friday  leading to a new fracture/disc herniation at C5/6 s/p surgical decompression 05/11/23. Patient has history of atrial fibrillation, in AF overnight 4/22. Patient is not on DOAC due to history of hematuria. Had aspiration event on 4/23 requiring intubation. -Continue holding plavix , per neurosurgery may resume 14 days after surgery. Will start asa 81 mg daily today. -Continue Atorvastatin  10 mg daily. -Coreg  DC yesterday due to bradycardia episodes. May consider low dose metoprolol  in future. Patient unable to tolerate DOAC due to hematuria.  -Plan to resume home GDMT post-op as BP and renal function allow. Cr elevated this AM limiting resumption of home ARB. Has been unable to afford Entresto  in the past.  This patient's plan of care was  discussed and created with Dr. Beau Bound and he is in agreement.    Signed: Hamp Levine, PA-C  05/14/2023, 8:14 AM [facility] Cardiology

## 2023-05-14 NOTE — Progress Notes (Signed)
 PHARMACY CONSULT NOTE - FOLLOW UP  Pharmacy Consult for Electrolyte Monitoring and Replacement   Recent Labs: Potassium (mmol/L)  Date Value  05/14/2023 3.6  08/17/2013 4.1   Magnesium  (mg/dL)  Date Value  40/98/1191 2.3  08/15/2013 1.8   Calcium  (mg/dL)  Date Value  47/82/9562 8.6 (L)   Calcium , Total (mg/dL)  Date Value  13/08/6576 8.7   Albumin (g/dL)  Date Value  46/96/2952 2.8 (L)  10/30/2014 4.4   Phosphorus (mg/dL)  Date Value  84/13/2440 3.5   Sodium (mmol/L)  Date Value  05/14/2023 139  10/30/2014 140  08/17/2013 138     Assessment: 83 y.o. male  with a past medical history of CAD s/p stent, chronic systolic congestive heart failure (EF 25% - 30% - 04/2020), Ischemic Cardiomyopathy (refuses AICD), AF RVR (not on Eliquis  due to hematuria), T2DM, CKD, hypothyroidism who presented to the ED on 05/10/2023 for falling out of a chair on Friday.   Goal of Therapy:  Electrolytes WNL  Plan:  ---no electrolyte replacement warranted for today ---recheck electrolytes in am  Billy Franco ,PharmD Clinical Pharmacist 05/14/2023 7:17 AM

## 2023-05-14 NOTE — Progress Notes (Signed)
   Neurosurgery Progress Note  History: Billy Franco is s/p C3-5 ACDF for cervical stenosis and myelopathy   POD3: Pt reintubated yesterday for respiratory concerns.  POD2: Pt had some increased pain and vertgo like symptoms yesterday as well as tachycardia and agitation overnight requiring precedex   POD1: Pt reporting continued weakness in his hands but improved leg sensation and proximal upper extremity moving. Swallowing well with minimal pain   Physical Exam: Vitals:   05/14/23 0800 05/14/23 0815  BP:    Pulse: (!) 45 72  Resp: (!) 8 (!) 6  Temp:    SpO2: 100% 100%   Intubated and sedated this morning PERRL Incision intact. Some swelling around drain site. No drainage on dressing. 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  - PTOT; patient to wear cervical collar when OOB and ambulating. Ok to remove when in bed and when eating (when able) - remainder of care per CC team  Billy Karvonen PA-C Department of Neurosurgery

## 2023-05-14 NOTE — Progress Notes (Signed)
 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  Per family at the bedside, patient fell out of a folding chair on Friday and landed on his left side. Following the fall, he reported generalized weakness, numbness and difficulties grasping objects with his hands. He reported hitting his head without LOC.   ED Course: Initial vital signs showed HR of  86 beats/minute, BP 103/88 mm Hg, the RR 16 breaths/minute, and the oxygen saturation 100% on RA and a temperature of 97.33F (36.4C).  Pertinent Labs/Diagnostics Findings: Na+/ K+:137/4.0  Glucose:103 BUN/Cr.:37/1.41 WBC: 6.4 K/L Hgb/Hct:10.6/32.5 CXR> CTH> CT Cervical, thoracic, lumbar and MRI brain, cervical as reported below with significant findings of new fracture/disc herniation at C5/6 with moderate stenosis. Findings discussed with on call Neurosurgeon who recommended admission to ICU with frequent neurochecks, pressor to keep MAP>85 and possible surgical intervention. PCCM consulted for admission.    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  Lovenox  to initiate in am per nsgy. I met with patient and family at bedside. He appears stable  and is in good spirits  05/12/23- patient doing well after decomrpession.  Patient reports feelings of falling and moving of med, I repositioned patient and reviewed what I could with family but patient still complains of these symptoms,  reviewed this with neurosurgeon.  He remains on neosynephrine.    05/13/23- JP drain removed,  patient with confusion/aggitation overnight started on precedex . Developing worsening AKI , have reviewed meds and started D5-HcO3 @75cc /hr , may need to discuss with family regarding intubation /GOC 05/14/23- patient unable to get NGT for dobhoff today.  AKI is improved.  For awakening trial today.   Consults:  Neurosurgery  Procedures:    Significant Diagnostic Tests:  04/20 Chest Xray> IMPRESSION: No active disease. Borderline to mild cardiomegaly.  04/20 CT Cervical Spine> IMPRESSION: 1. Negative for acute fracture or subluxation of the cervical spine. 2. 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  04/20 CT THORACIC SPINE IMPRESSION IMPRESSION: 1. Multilevel degenerative changes without evidence of acute fracture. 2. Trace bilateral pleural effusions with atelectasis. 3. Aortic atherosclerosis with coronary artery calcifications.   04/20 CT LUMBAR SPINE IMPRESSION 1. 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 2. Multilevel degenerative changes in the lumbar spine as described above. 3. Left renal calculi.  04/20 Noncontrast CT head> IMPRESSION: 1. Mild motion degradation limits the exam. No definite CT evidence for acute intracranial abnormality. 2. Atrophy and chronic small vessel ischemic changes of the white matter. Interval small chronic appearing right occipital infarct, new compared  to 2021 head CT.  4/20 MRI Brain> IMPRESSION: 1. No evidence  of acute intracranial abnormality. 2. Remote right PCA territory infarct.  4/20 MRI Cervical Spine> IMPRESSION: 1. 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 Edema extends posteriorly to involve the C6-C7 disc, compatible with traumatic disc injury. Small volume of adjacent prevertebral edema. 2. At C3-C4, severe canal stenosis with cord compression. T2 hyperintensity in the cord at this level is compatible with cord edema and/or myelomalacia. Severe bilateral foraminal stenosis at this level. 3. At C4-C5, moderate to severe canal stenosis and severe bilateral foraminal stenosis. 4. At C6-C7, mild to moderate canal stenosis and moderate bilateral foraminal stenosis.  Interim History / Subjective:      Micro Data:  4/20: MRSA PCR>>   Antimicrobials:  None  OBJECTIVE  Blood pressure 98/60, pulse 72, temperature 97.9 F (36.6 C), temperature source Axillary, resp. rate (!) 6, height 5\' 7"  (1.702 m), weight 72.6 kg, SpO2 100%.    Vent Mode: PRVC FiO2 (%):  [28 %-50 %] 28 % Set Rate:  [18 bmp] 18 bmp Vt Set:  [500 mL] 500 mL PEEP:  [5 cmH20] 5 cmH20 Plateau Pressure:  [19 cmH20-20 cmH20] 19 cmH20   Intake/Output Summary (Last 24 hours) at 05/14/2023 1041 Last data filed at 05/14/2023 0600 Gross per 24 hour  Intake 2999.06 ml  Output 3240 ml  Net -240.94 ml   Filed Weights   05/12/23 0500 05/13/23 0357 05/14/23 0500  Weight: 73.6 kg 74.1 kg 72.6 kg     Physical Examination  GENERAL:83  year-old critically ill patient lying in the bed in no acute distress EYES: PEERLA. No scleral icterus. Extraocular muscles intact.  HEENT: Head traumatic,head laceration. normocephalic. Oropharynx and nasopharynx clear.  NECK:  No JVD, supple  LUNGS: Normal breath sounds bilaterally.  No use of accessory muscles of respiration.  CARDIOVASCULAR: S1, S2 normal. No murmurs, rubs, or gallops.  ABDOMEN: Soft, NTND EXTREMITIES: No swelling or erythema. Grip 4/5 bilaterally.  Pulses palpable distally. NEUROLOGIC: The patient is A&O X 4. No focal neurological deficit appreciated. Cranial nerves are intact.  SKIN: No obvious rash, lesion, or ulcer. Warm to touch Labs/imaging that I havepersonally reviewed  (right click and "Reselect all SmartList Selections" daily)     Labs   CBC: Recent Labs  Lab 05/10/23 1621 05/11/23 0300 05/12/23 0545 05/13/23 0338 05/13/23 1605 05/14/23 0434  WBC 6.4 9.2 11.3* 15.2* 11.6* 13.1*  NEUTROABS 4.5  --   --   --   --   --   HGB 10.6* 12.0* 10.7* 11.0* 10.2* 11.1*  HCT 32.5* 36.3* 31.5* 33.5* 32.3* 33.8*  MCV 98.5 96.3 96.3 98.5 101.3* 98.3  PLT 210 262 243 247 212 272    Basic Metabolic Panel: Recent Labs  Lab 05/11/23 0300 05/12/23 0545 05/13/23 0338 05/13/23 1605 05/14/23 0434  NA 138 133* 134* 131* 139  K 3.8 3.8 3.9 4.0 3.6  CL 103 105 107 103 105  CO2 23 17* 17* 17* 21*  GLUCOSE 84 165* 125* 317* 285*  BUN 32* 35* 44* 44* 47*  CREATININE 1.40* 1.63* 2.44* 2.60* 2.03*  CALCIUM  9.4 8.5* 8.4* 7.9* 8.6*  MG 1.9 1.7 2.1 2.1 2.3  PHOS 3.2 4.6 4.6  --  3.5   GFR: Estimated Creatinine Clearance: 26.2 mL/min (A) (by C-G formula based on SCr of 2.03 mg/dL (H)). Recent Labs  Lab 05/12/23 0545 05/13/23 0338 05/13/23 1110 05/13/23 1605 05/13/23 1813 05/14/23 0434  PROCALCITON  --   --  0.50  --   --   --   WBC 11.3* 15.2*  --  11.6*  --  13.1*  LATICACIDVEN  --   --   --  1.0 0.9  --     Liver Function Tests: Recent Labs  Lab 05/10/23 1621 05/13/23 1605  AST 30 40  ALT 22 13  ALKPHOS 57 49  BILITOT 1.0 1.0  PROT 7.1 5.8*  ALBUMIN 4.0 2.8*   No results for input(s): "LIPASE", "AMYLASE" in the last 168 hours. No results for input(s): "AMMONIA" in the last 168 hours.  ABG    Component Value Date/Time   PHART 7.33 (L) 05/13/2023 1630   PCO2ART 33 05/13/2023 1630   PO2ART 127 (H) 05/13/2023 1630   HCO3 17.4 (L) 05/13/2023 1630   TCO2 26 04/15/2008 2244   ACIDBASEDEF 7.5 (H) 05/13/2023 1630    O2SAT 99.2 05/13/2023 1630     Coagulation Profile: Recent Labs  Lab 05/10/23 2309  INR 1.0    Cardiac Enzymes: Recent Labs  Lab 05/10/23 1621  CKTOTAL 292    HbA1C: Hemoglobin A1C  Date/Time Value Ref Range Status  02/02/2023 08:50 AM 7.8 (A) 4.0 - 5.6 % Final  09/15/2022 08:38 AM 7.3 (A) 4.0 - 5.6 % Final   HbA1c, POC (controlled diabetic range)  Date/Time Value Ref Range Status  01/04/2018 09:16 AM 6.4 0.0 - 7.0 % Final   Hgb A1c MFr Bld  Date/Time Value Ref Range Status  05/06/2021 11:21 AM 6.9 (H) <5.7 % of total Hgb Final    Comment:    For someone without known diabetes, a hemoglobin A1c value of 6.5% or greater indicates that they may have  diabetes and this should be confirmed with a follow-up  test. . For someone with known diabetes, a value <7% indicates  that their diabetes is well controlled and a value  greater than or equal to 7% indicates suboptimal  control. A1c targets should be individualized based on  duration of diabetes, age, comorbid conditions, and  other considerations. . Currently, no consensus exists regarding use of hemoglobin A1c for diagnosis of diabetes for children. Aaron Aas   09/10/2020 09:25 AM 6.4 (H) <5.7 % of total Hgb Final    Comment:    For someone without known diabetes, a hemoglobin  A1c value between 5.7% and 6.4% is consistent with prediabetes and should be confirmed with a  follow-up test. . For someone with known diabetes, a value <7% indicates that their diabetes is well controlled. A1c targets should be individualized based on duration of diabetes, age, comorbid conditions, and other considerations. . This assay result is consistent with an increased risk of diabetes. . Currently, no consensus exists regarding use of hemoglobin A1c for diagnosis of diabetes for children. .     CBG: Recent Labs  Lab 05/13/23 1626 05/13/23 1945 05/13/23 2347 05/14/23 0430 05/14/23 0732  GLUCAP 155* 217* 289* 272*  214*    Review of Systems:    10 point ROS conducted and is negative except as per HPI and subjective findings   Past Medical History  He,  has a past medical history of Arthritis, Atrial fibrillation (HCC), CHF (congestive heart failure) (HCC), CKD (chronic kidney disease), stage III (HCC), Complication of anesthesia, Diabetes mellitus without complication (HCC), GERD (gastroesophageal reflux disease), Hypertension, Hypothyroidism, and Nephrolithiasis.   Surgical History    Past Surgical History:  Procedure Laterality Date   ANTERIOR CERVICAL DECOMP/DISCECTOMY FUSION N/A 05/11/2023   Procedure: ANTERIOR CERVICAL DECOMPRESSION/DISCECTOMY FUSION  2 LEVELS;  Surgeon: Jodeen Munch, MD;  Location: [facility] ORS;  Service: Neurosurgery;  Laterality: N/A;   CATARACT EXTRACTION W/PHACO Right 05/07/2021   Procedure: CATARACT EXTRACTION PHACO AND INTRAOCULAR LENS PLACEMENT (IOC) RIGHT DIABETIC 8.63 00:56.5;  Surgeon: Clair Crews, MD;  Location: [facility];  Service: Ophthalmology;  Laterality: Right;  Diabetic   CATARACT EXTRACTION W/PHACO Left 05/21/2021   Procedure: CATARACT EXTRACTION PHACO AND INTRAOCULAR LENS PLACEMENT (IOC) LEFT DIABETIC 7.27 01:01.5;  Surgeon: Clair Crews, MD;  Location: [facility];  Service: Ophthalmology;  Laterality: Left;  Diabetic   CORONARY STENT INTERVENTION N/A 03/08/2020   Procedure: CORONARY STENT INTERVENTION;  Surgeon: Antonette Batters, MD;  Location: [facility] IN[facility]IVE CV [facility];  Service: Cardiovascular;  Laterality: N/A;   LEFT HEART CATH AND CORONARY ANGIOGRAPHY Left 03/08/2020   Procedure: LEFT HEART CATH AND CORONARY ANGIOGRAPHY;  Surgeon: Antonette Batters, MD;  Location: [facility] INV[facility] CV LAB;  Service: Cardiovascular;  Laterality: Left;   NASAL FRACTURE SURGERY  1994   URETEROLITHOTOMY Right 2015     Social History   reports that he has never smoked. He has never used smokeless tobacco. He reports that he does not drink alcohol and  does not use drugs.   Family History   His family history includes Heart disease in his father.   Allergies Allergies  Allergen Reactions   Codeine Rash and Other (See Comments)    Per pt "hard on my kidneys"     Lovastatin Rash and Other (See Comments)   Tape Rash     Home Medications  Prior to Admission medications   Medication Sig Start Date End Date Taking? Authorizing Provider  acetaminophen  (TYLENOL ) 650 MG CR tablet Take 650 mg by mouth every 8 (eight) hours as needed for pain.   Yes [provider]  atorvastatin  (LIPITOR) 10 MG tablet Take 1 tablet (10 mg total) by mouth daily at 6 PM. 02/02/23  Yes Sowles, Krichna, MD  carvedilol  (COREG ) 3.125 MG tablet Take 1 tablet (3.125 mg total) by mouth 2 (two) times daily with a meal. 12/25/19 05/10/23 Yes Magdalene School, MD  clopidogrel  (PLAVIX ) 75 MG tablet Take 75 mg by mouth daily. 03/04/23  Yes [provider]  furosemide  (LASIX ) 40 MG tablet Take 40 mg by mouth daily. 05/03/23  Yes [provider]  glipiZIDE  2.5 MG TABS Take 1 tablet by mouth daily with breakfast. 02/02/23  Yes Sowles, Krichna, MD  levothyroxine  (SYNTHROID ) 25 MCG tablet TAKE 1 TABLET (25 MCG TOTAL) BY MOUTH DAILY BEFORE BREAKFAST. ON SUNDAYS, TAKE 2 TABLETS BY MOUTH BEFORE BREAKFAST. 04/27/23  Yes Sowles, Krichna, MD  losartan  (COZAAR ) 50 MG tablet Take 50 mg by mouth daily. 05/03/23  Yes [provider]  magnesium  oxide (MAG-OX) 400 MG tablet Take 400 mg by mouth daily.   Yes [provider]  metaxalone (SKELAXIN) 800 MG tablet May take 1/2-1 whole tablet up to 2 times daily as needed for pain/spasm. 04/14/22  Yes [provider]  metFORMIN  (GLUCOPHAGE -XR) 750 MG 24 hr tablet Take 2 tablets (1,500 mg total) by mouth daily with breakfast. 02/02/23  Yes Sowles, Krichna, MD  pantoprazole  (PROTONIX ) 40 MG tablet Take 1 tablet (40 mg total) by mouth daily. 11/10/22 11/05/23 Yes Brigitte Canard, PA-C  rOPINIRole   (REQUIP ) 1 MG tablet Take 1 tablet (1 mg total) by mouth 2 (two) times daily. 02/02/23  Yes Sowles, Krichna, MD  spironolactone (ALDACTONE) 25 MG tablet Take 12.5 mg by mouth daily.   Yes  Callwood, Dwayne D, MD  triamcinolone  (KENALOG ) 0.1 % Apply 1 application topically 2 (two) times daily as needed (rash).   Yes [provider]  aluminum hydroxide-magnesium  carbonate (GAVISCON) 95-358 MG/15ML SUSP Take by mouth.    [provider]  blood glucose meter kit and supplies 1 each by Other route as directed. Dispense based on patient and insurance preference. Use up to four times daily as directed. (FOR ICD-10 E10.9, E11.9). 09/09/21   Sowles, Krichna, MD  Cholecalciferol (VITAMIN D -1000 MAX ST) 25 MCG (1000 UT) tablet Take 1 tablet by mouth daily.    [provider]  Cyanocobalamin (B-12) 1000 MCG TBCR Take 1,000 mcg by mouth every other day. 01/02/20   [provider]  dapagliflozin  propanediol (FARXIGA ) 10 MG TABS tablet Take by mouth. 01/10/22   [provider]  docusate sodium  (COLACE) 50 MG capsule Take 100 mg by mouth 2 (two) times daily as needed for mild constipation.    [provider]  fexofenadine-pseudoephedrine (ALLEGRA-D 24) 180-240 MG 24 hr tablet Take 1 tablet by mouth daily as needed (allergies).    [provider]  furosemide  (LASIX ) 20 MG tablet Take 40 mg by mouth daily. Patient not taking: Reported on 05/10/2023 01/02/20   Callwood, Creig Doe D, MD  glucose blood (ONETOUCH ULTRA) test strip USE AS DIRECTED TO CHECK BLOOD GLUCOSE ONCE DAILY. 05/12/22   Sowles, Krichna, MD  Lancets [facility] DELICA PLUS [place]) MISC CHECK BLOOD SUGAR AT NOON 03/02/23   Sowles, Krichna, MD  losartan  (COZAAR ) 25 MG tablet Take 1 tablet (25 mg total) by mouth at bedtime. Patient not taking: Reported on 05/10/2023 12/25/19   Chatterjee, Srobona Tublu, MD  Nutritional Supplements (THERALITH XR PO) Take by mouth QID. 3.752-45-45-49.5mg . Take 2 in the  evening and 2 at bedtime.    [provider]  polyethylene glycol powder (GLYCOLAX /MIRALAX ) 17 GM/SCOOP powder Take 17 g by mouth daily. 11/10/22   Brigitte Canard, PA-C  tamsulosin  (FLOMAX ) 0.4 MG CAPS capsule Take 1 capsule (0.4 mg total) by mouth every evening. 09/08/22   Geraline Knapp, MD        Assessment & Plan:  #Cervical Spine Injury in the setting of traumatic fall MRI Cervical spine shows chronic compression and stenosis at C3/4 and a new fracture/disc herniation Ligamentous injury with edema at C5/6 with moderate stenosis  -maintain C-Spine precaution -Start pressors for MAP augmentation goal >85 mm- currently on neo -Frequent neuro checks -PRN Pain management -Keep NPO -Hold anticoagulation -Neurosurgery consult- s/p surgery   #Chronic HFrEF/Ischemic cardiomyopathy:  EF 40-45%  #Chronic A-Fib not on anticoagulation due to gross hematuria Hx:CAD s/p PCI and stent, HTN, HLD No vascular congestion on CXR yesterday. Appears euvolemic -Hold ? blockers /GDMT -will hold off on acei/arb/arni at this time to augment BP  -Continue Atorvastatin  once able to take po  #AKI on CKD Stage III -Monitor I&O's / urinary output -Follow BMP -Ensure adequate renal perfusion -Avoid nephrotoxic agents as able -Replace electrolytes as indicated   #Diabetes Mellitus -check hemoglobin A1c -CBGs q4 -Sliding scale insulin  -Follow ICU hyper/hypoglycemia protocol -Hold home Metformin  & Glipizide    #Hypothyroidism -Continue home synthroid   #RLS -Continue Requip   Best practice:  Diet:  NPO Pain/Anxiety/Delirium protocol (if indicated): No VAP protocol (if indicated): Not indicated DVT prophylaxis: Contraindicated GI prophylaxis: PPI Glucose control:  SSI Yes Central venous access:  N/A Arterial line:  N/A Foley:  N/A Mobility:  bed rest  PT consulted: N/A Last date of multidisciplinary goals of care  discussion [updated family at the bedside] Code Status:  full  code Disposition: ICU   = Goals of Care = Code Status Order: full  Primary Emergency Contact: Nathanie, Ottley, Home Phone: [phone] Patient and family wishes to pursue full aggressive treatment and intervention options, including CPR and intubation  Critical care provider statement:   Total critical care time: 32 minutes   Performed by: Jaclynn Mast MD   Critical care time was exclusive of separately billable procedures and treating other patients.   Critical care was necessary to treat or prevent imminent or life-threatening deterioration.   Critical care was time spent personally by me on the following activities: development of treatment plan with patient and/or surrogate as well as nursing, discussions with consultants, evaluation of patient's response to treatment, examination of patient, obtaining history from patient or surrogate, ordering and performing treatments and interventions, ordering and review of laboratory studies, ordering and review of radiographic studies, pulse oximetry and re-evaluation of patient's condition.    Daliah Chaudoin, M.D.  Pulmonary & Critical Care Medicine

## 2023-05-14 NOTE — Progress Notes (Addendum)
 OT Cancellation Note  Patient Details Name: Billy Franco MRN: [id number] DOB: Oct 10, 1940   Cancelled Treatment:    Reason Eval/Treat Not Completed: Other (comment) (Patient was intubated yesterday due to concern for ablity to protect his airway. OT will complete orders at this time. Please re-consult when medically ready for early ICU mobility/ intervention as appropriate even if pt is still intubated.)  Gerre Kraft, OTD OTR/L  05/14/23, 8:44 AM

## 2023-05-14 NOTE — Progress Notes (Signed)
 Initial Nutrition Assessment  DOCUMENTATION CODES:   Not applicable  INTERVENTION:   If enteral access able to be gained:  Vital 1.2@50ml /hr- Initiate at 66ml/hr and increase by 10ml/hr q 8 hours until goal rate is reached.   ProSource TF 20- Give 60ml daily via tube, each supplement provides 80kcal and 20g of protein.   Free water  flushes 30ml q4 hours to maintain tube patency   Regimen provides 1520kcal/day, 110g/day protein and 1112ml/day of free water .   Pt at high refeed risk; recommend monitor potassium, magnesium  and phosphorus labs daily until stable  Daily weights   NUTRITION DIAGNOSIS:   Inadequate oral intake related to inability to eat (pt sedated and ventilated) as evidenced by NPO status.  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  Pt sedated and ventilated. OGT unable to be placed by nursing or MD; plan is to attempt dobhoff tube placement today. Pt has now been without nutrition for 4 days. Will plan to initiate tube feeds if access is able to be gained. Pt is at high refeed risk. No BM since 4/20. Per chart, pt is up ~9lbs since admission. Pt +3.1L on his I & Os.   Medications reviewed and include: aspirin , dexamethasone , colace, lovenox , insulin , synthroid , protonix , miralax , senokot, levophed , zosyn , propofol , Na bicarbonate   Labs reviewed: K 3.6 wnl, BUN 47(H), creat 2.03(H), P 3.5 wnl, Mg  2.3 wnl Wbc- 13.1(H) Cbgs- 112, 214, 272 x 24 hrs  AIC 7.8(H)- 01/2023  Patient is currently intubated on ventilator support MV: 7.9 L/min Temp (24hrs), Avg:98.4 F (36.9 C), Min:97.9 F (36.6 C), Max:98.9 F (37.2 C)  Propofol : 11.12 ml/hr- provides 293kcal/day  MAP >77mmHg   UOP-   NUTRITION - FOCUSED PHYSICAL EXAM:  Flowsheet Row Most Recent Value  Orbital Region No depletion  Upper Arm Region Mild depletion  Thoracic and Lumbar Region No depletion  Buccal Region No depletion  Temple Region No depletion  Clavicle Bone Region Mild depletion  Clavicle and Acromion Bone Region Mild depletion  Scapular Bone Region No depletion  Dorsal Hand No depletion  Patellar Region Moderate depletion  Anterior Thigh Region Moderate depletion  Posterior Calf Region Moderate depletion  Edema (RD Assessment) None  Hair Reviewed  Eyes Reviewed  Mouth Reviewed  Skin Reviewed  Nails Reviewed   Diet Order:   Diet Order             Diet NPO time specified  Diet effective now                  EDUCATION NEEDS:   No education needs have been identified at this time  Skin:  Skin Assessment: Reviewed RN Assessment (incision neck, skin tears)  Last BM:  4/20  Height:   Ht Readings from Last 1 Encounters:  05/10/23 5\' 7"  (1.702 m)    Weight:   Wt Readings from Last 1 Encounters:  05/14/23 72.6 kg    Ideal  Body Weight:  67.2 kg  BMI:  Body mass index is 25.07 kg/m.  Estimated Nutritional Needs:   Kcal:  1579kcal/day  Protein:  100-115g/day  Fluid:  1.7-2.0L/day  Torrance Freestone MS, RD, LDN If unable to be reached, please send secure chat to "RD inpatient" available from 8:00a-4:00p daily

## 2023-05-14 NOTE — Progress Notes (Signed)
 PT Cancellation Note  Patient Details Name: Billy Franco MRN: [id number] DOB: [birth year]   Cancelled Treatment:    Reason Eval/Treat Not Completed: Patient not medically ready Patient is currently intubated. PT will complete orders at this time. Please re-consult when medically appropriate for early ICU intervention.  Janine Melbourne, PT, DPT Physical Therapist - [place]  [facility]    Kamarius Buckbee A Aldahir Litaker 05/14/2023, 8:16 AM

## 2023-05-15 ENCOUNTER — Inpatient Hospital Stay
Admit: 2023-05-15 | Discharge: 2023-05-15 | Disposition: A | Discharge status: Home / Self Care | Attending: Pulmonary Disease | Admitting: Pulmonary Disease

## 2023-05-15 LAB — BASIC METABOLIC PANEL WITH GFR
Anion gap: 11 (ref 5–15)
BUN: 45 mg/dL — ABNORMAL HIGH (ref 8–23)
CO2: 27 mmol/L (ref 22–32)
Calcium: 8.2 mg/dL — ABNORMAL LOW (ref 8.9–10.3)
Chloride: 102 mmol/L (ref 98–111)
Creatinine, Ser: 1.64 mg/dL — ABNORMAL HIGH (ref 0.61–1.24)
GFR, Estimated: 42 mL/min — ABNORMAL LOW (ref >60)
Glucose, Bld: 145 mg/dL — ABNORMAL HIGH (ref 70–99)
Potassium: 3.1 mmol/L — ABNORMAL LOW (ref 3.5–5.1)
Sodium: 140 mmol/L (ref 135–145)

## 2023-05-15 LAB — ECHOCARDIOGRAM COMPLETE
AR max vel: 2.36 cm2
AV Area VTI: 2.13 cm2
AV Area mean vel: 1.88 cm2
AV Mean grad: 2 mmHg
AV Peak grad: 4.4 mmHg
Ao pk vel: 1.05 m/s
Area-P 1/2: 1.75 cm2
Calc EF: 14.9 %
Height: 67 in
S' Lateral: 4.5 cm
Single Plane A2C EF: 11.1 %
Single Plane A4C EF: 26.1 %
Weight: 2610.25 [oz_av]

## 2023-05-15 LAB — MAGNESIUM: Magnesium: 2.2 mg/dL (ref 1.7–2.4)

## 2023-05-15 LAB — CBC
HCT: 28.7 % — ABNORMAL LOW (ref 39.0–52.0)
Hemoglobin: 9.9 g/dL — ABNORMAL LOW (ref 13.0–17.0)
MCH: 32.6 pg (ref 26.0–34.0)
MCHC: 34.5 g/dL (ref 30.0–36.0)
MCV: 94.4 fL (ref 80.0–100.0)
Platelets: 214 10*3/uL (ref 150–400)
RBC: 3.04 MIL/uL — ABNORMAL LOW (ref 4.22–5.81)
RDW: 12.9 % (ref 11.5–15.5)
WBC: 9.5 10*3/uL (ref 4.0–10.5)
nRBC: 0.6 % — ABNORMAL HIGH (ref 0.0–0.2)

## 2023-05-15 LAB — THYROID PANEL WITH TSH
Free Thyroxine Index: 2.2 (ref 1.2–4.9)
T3 Uptake Ratio: 38 % (ref 24–39)
T4, Total: 5.9 ug/dL (ref 4.5–12.0)
TSH: 2.09 u[IU]/mL (ref 0.450–4.500)

## 2023-05-15 LAB — PHOSPHORUS: Phosphorus: 2 mg/dL — ABNORMAL LOW (ref 2.5–4.6)

## 2023-05-15 LAB — GLUCOSE, CAPILLARY
Glucose-Capillary: 142 mg/dL — ABNORMAL HIGH (ref 70–99)
Glucose-Capillary: 132 mg/dL — ABNORMAL HIGH (ref 70–99)
Glucose-Capillary: 146 mg/dL — ABNORMAL HIGH (ref 70–99)
Glucose-Capillary: 102 mg/dL — ABNORMAL HIGH (ref 70–99)
Glucose-Capillary: 126 mg/dL — ABNORMAL HIGH (ref 70–99)
Glucose-Capillary: 106 mg/dL — ABNORMAL HIGH (ref 70–99)

## 2023-05-15 MED ORDER — POTASSIUM PHOSPHATES 15 MMOLE/5ML IV SOLN
20.0000 mmol | Freq: Once | INTRAVENOUS | Status: DC
  Filled 2023-05-15: qty 6.67

## 2023-05-15 MED ORDER — POTASSIUM PHOSPHATES 15 MMOLE/5ML IV SOLN
20.0000 mmol | Freq: Once | INTRAVENOUS | Status: AC
  Administered 2023-05-15: 20 mmol via INTRAVENOUS
  Filled 2023-05-15: qty 6.67

## 2023-05-15 MED ORDER — DEXAMETHASONE SODIUM PHOSPHATE 4 MG/ML IJ SOLN
4.0000 mg | Freq: Two times a day (BID) | INTRAMUSCULAR | Status: AC
  Administered 2023-05-16 – 2023-05-17 (×2): 4 mg via INTRAVENOUS
  Filled 2023-05-15 (×3): qty 1

## 2023-05-15 MED ORDER — POTASSIUM & SODIUM PHOSPHATES 280-160-250 MG PO PACK: 2.0000 | PACK | ORAL | Status: DC

## 2023-05-15 MED ORDER — DEXAMETHASONE SODIUM PHOSPHATE 4 MG/ML IJ SOLN
4.0000 mg | Freq: Three times a day (TID) | INTRAMUSCULAR | Status: AC
Start: 2023-05-15 — End: 2023-05-16
  Administered 2023-05-15 – 2023-05-16 (×3): 4 mg via INTRAVENOUS
  Filled 2023-05-15 (×3): qty 1

## 2023-05-15 MED ORDER — DEXAMETHASONE SODIUM PHOSPHATE 4 MG/ML IJ SOLN: 2.0000 mg | Freq: Two times a day (BID) | INTRAMUSCULAR | Status: DC

## 2023-05-15 MED ORDER — ENOXAPARIN SODIUM 40 MG/0.4ML IJ SOSY
40.0000 mg | PREFILLED_SYRINGE | INTRAMUSCULAR | Status: DC
  Administered 2023-05-15 – 2023-05-22 (×8): 40 mg via SUBCUTANEOUS
  Filled 2023-05-15 (×8): qty 0.4

## 2023-05-15 NOTE — Progress Notes (Signed)
 NAME:  Billy Franco, MRN:  [id number], DOB:  November 22, 1940, LOS: 5 ADMISSION DATE:  05/10/2023, CONSULTATION DATE:  05/10/23 REFERRING MD:  Claria Crofts CHIEF COMPLAINT:  traumatic fall    HPI  83 y.o male with significant PMH of  T2DM, CKD stage III, HTN, HLD, HFrEF with EF 25-30%, refused AICD, ischemic Cardiomyopathy, A-Fib  not on anticoagulation due to gross hematuria, CAD s/p PCI and stent, GERD, RLS and chronic back pain who presented to the ED with chief complaints of bilateral upper and lower extremities weakness and numbness s/p fall.  Per family at the bedside, patient fell out of a folding chair on Friday and landed on his left side. Following the fall, he reported generalized weakness, numbness and difficulties grasping objects with his hands. He reported hitting his head without LOC.   ED Course: Initial vital signs showed HR of  86 beats/minute, BP 103/88 mm Hg, the RR 16 breaths/minute, and the oxygen saturation 100% on RA and a temperature of 97.42F (36.4C).  Pertinent Labs/Diagnostics Findings: Na+/ K+:137/4.0  Glucose:103 BUN/Cr.:37/1.41 WBC: 6.4 K/L Hgb/Hct:10.6/32.5 CXR> CTH> CT Cervical, thoracic, lumbar and MRI brain, cervical as reported below with significant findings of new fracture/disc herniation at C5/6 with moderate stenosis. Findings discussed with on call Neurosurgeon who recommended admission to ICU with frequent neurochecks, pressor to keep MAP>85 and possible surgical intervention. PCCM consulted for admission.    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  Lovenox  to initiate in am per nsgy. I met with patient and family at bedside. He appears stable  and is in good spirits  05/12/23- patient doing well after decomrpession.  Patient reports feelings of falling and moving of med, I repositioned patient and reviewed what I could with family but patient still complains of these symptoms,  reviewed this with neurosurgeon.  He remains on neosynephrine.    05/13/23- JP drain removed,  patient with confusion/aggitation overnight started on precedex . Developing worsening AKI , have reviewed meds and started D5-HcO3 @75cc /hr , may need to discuss with family regarding intubation /GOC 05/14/23- patient unable to get NGT for dobhoff today.  AKI is improved.  For awakening trial today.  05/15/23- patient for SBT today currently on 10/5 PS, mentation is improved following verbal communication, he's off levophed  now.  AKI improved. Patient liberated from MV.  Consults:  Neurosurgery  Procedures:    Significant Diagnostic Tests:  04/20 Chest Xray> IMPRESSION: No active disease. Borderline to mild cardiomegaly.  04/20 CT Cervical Spine> IMPRESSION: 1. Negative for acute fracture or subluxation of the cervical spine. 2. 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  04/20 CT THORACIC SPINE IMPRESSION IMPRESSION: 1. Multilevel degenerative changes without evidence of acute fracture. 2. Trace bilateral pleural effusions with atelectasis. 3. Aortic atherosclerosis with coronary artery calcifications.   04/20 CT LUMBAR SPINE IMPRESSION 1. 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 2. Multilevel degenerative changes in the lumbar spine as described above. 3. Left renal calculi.  04/20 Noncontrast CT head> IMPRESSION: 1. Mild motion degradation limits the exam. No definite CT  evidence for acute intracranial abnormality. 2. Atrophy and chronic small vessel  ischemic changes of the white matter. Interval small chronic appearing right occipital infarct, new compared to 2021 head CT.  4/20 MRI Brain> IMPRESSION: 1. No evidence of acute intracranial abnormality. 2. Remote right PCA territory infarct.  4/20 MRI Cervical Spine> IMPRESSION: 1. 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 Edema extends posteriorly to involve the C6-C7 disc, compatible with traumatic disc injury. Small volume of adjacent prevertebral edema. 2. At C3-C4, severe canal stenosis with cord compression. T2 hyperintensity in the cord at this level is compatible with cord edema and/or myelomalacia. Severe bilateral foraminal stenosis at this level. 3. At C4-C5, moderate to severe canal stenosis and severe bilateral foraminal stenosis. 4. At C6-C7, mild to moderate canal stenosis and moderate bilateral foraminal stenosis.  Interim History / Subjective:      Micro Data:  4/20: MRSA PCR>>   Antimicrobials:  None  OBJECTIVE  Blood pressure 98/60, pulse 65, temperature 98.7 F (37.1 C), temperature source Axillary, resp. rate (!) 26, height 5\' 7"  (1.702 m), weight 74 kg, SpO2 100%.    Vent Mode: PRVC FiO2 (%):  [28 %] 28 % Set Rate:  [18 bmp] 18 bmp Vt Set:  [500 mL] 500 mL PEEP:  [5 cmH20] 5 cmH20 Plateau Pressure:  [17 cmH20-20 cmH20] 20 cmH20   Intake/Output Summary (Last 24 hours) at 05/15/2023 1045 Last data filed at 05/15/2023 0935 Gross per 24 hour  Intake 2743.7 ml  Output 1000 ml  Net 1743.7 ml   Filed Weights   05/13/23 0357 05/14/23 0500 05/15/23 0500  Weight: 74.1 kg 72.6 kg 74 kg     Physical Examination  GENERAL:83  year-old critically ill patient lying in the bed in no acute distress EYES: PEERLA. No scleral icterus. Extraocular muscles intact.  HEENT: Head traumatic,head laceration. normocephalic. Oropharynx and nasopharynx clear.  NECK:  No JVD, supple  LUNGS: Normal breath sounds bilaterally.  No use of accessory  muscles of respiration.  CARDIOVASCULAR: S1, S2 normal. No murmurs, rubs, or gallops.  ABDOMEN: Soft, NTND EXTREMITIES: No swelling or erythema. Grip 4/5 bilaterally. Pulses palpable distally. NEUROLOGIC: The patient is A&O X 4. No focal neurological deficit appreciated. Cranial nerves are intact.  SKIN: No obvious rash, lesion, or ulcer. Warm to touch Labs/imaging that I havepersonally reviewed  (right click and "Reselect all SmartList Selections" daily)     Labs   CBC: Recent Labs  Lab 05/10/23 1621 05/11/23 0300 05/12/23 0545 05/13/23 0338 05/13/23 1605 05/14/23 0434 05/15/23 0402  WBC 6.4   < > 11.3* 15.2* 11.6* 13.1* 9.5  NEUTROABS 4.5  --   --   --   --   --   --   HGB 10.6*   < > 10.7* 11.0* 10.2* 11.1* 9.9*  HCT 32.5*   < > 31.5* 33.5* 32.3* 33.8* 28.7*  MCV 98.5   < > 96.3 98.5 101.3* 98.3 94.4  PLT 210   < > 243 247 212 272 214   < > = values in this interval not displayed.    Basic Metabolic Panel: Recent Labs  Lab 05/11/23 0300 05/12/23 0545 05/13/23 0338 05/13/23 1605 05/14/23 0434 05/15/23 0402  NA 138 133* 134* 131* 139 140  K 3.8 3.8 3.9 4.0 3.6 3.1*  CL 103 105 107 103 105 102  CO2 23 17* 17* 17* 21* 27  GLUCOSE 84 165* 125* 317* 285* 145*  BUN 32* 35* 44* 44* 47* 45*  CREATININE  1.40* 1.63* 2.44* 2.60* 2.03* 1.64*  CALCIUM  9.4 8.5* 8.4* 7.9* 8.6* 8.2*  MG 1.9 1.7 2.1 2.1 2.3 2.2  PHOS 3.2 4.6 4.6  --  3.5 2.0*   GFR: Estimated Creatinine Clearance: 32.5 mL/min (A) (by C-G formula based on SCr of 1.64 mg/dL (H)). Recent Labs  Lab 05/13/23 0338 05/13/23 1110 05/13/23 1605 05/13/23 1813 05/14/23 0434 05/15/23 0402  PROCALCITON  --  0.50  --   --   --   --   WBC 15.2*  --  11.6*  --  13.1* 9.5  LATICACIDVEN  --   --  1.0 0.9  --   --     Liver Function Tests: Recent Labs  Lab 05/10/23 1621 05/13/23 1605  AST 30 40  ALT 22 13  ALKPHOS 57 49  BILITOT 1.0 1.0  PROT 7.1 5.8*  ALBUMIN 4.0 2.8*   No results for input(s): "LIPASE",  "AMYLASE" in the last 168 hours. No results for input(s): "AMMONIA" in the last 168 hours.  ABG    Component Value Date/Time   PHART 7.33 (L) 05/13/2023 1630   PCO2ART 33 05/13/2023 1630   PO2ART 127 (H) 05/13/2023 1630   HCO3 17.4 (L) 05/13/2023 1630   TCO2 26 04/15/2008 2244   ACIDBASEDEF 7.5 (H) 05/13/2023 1630   O2SAT 99.2 05/13/2023 1630     Coagulation Profile: Recent Labs  Lab 05/10/23 2309  INR 1.0    Cardiac Enzymes: Recent Labs  Lab 05/10/23 1621  CKTOTAL 292    HbA1C: Hemoglobin A1C  Date/Time Value Ref Range Status  02/02/2023 08:50 AM 7.8 (A) 4.0 - 5.6 % Final  09/15/2022 08:38 AM 7.3 (A) 4.0 - 5.6 % Final   HbA1c, POC (controlled diabetic range)  Date/Time Value Ref Range Status  01/04/2018 09:16 AM 6.4 0.0 - 7.0 % Final   Hgb A1c MFr Bld  Date/Time Value Ref Range Status  05/06/2021 11:21 AM 6.9 (H) <5.7 % of total Hgb Final    Comment:    For someone without known diabetes, a hemoglobin A1c value of 6.5% or greater indicates that they may have  diabetes and this should be confirmed with a follow-up  test. . For someone with known diabetes, a value <7% indicates  that their diabetes is well controlled and a value  greater than or equal to 7% indicates suboptimal  control. A1c targets should be individualized based on  duration of diabetes, age, comorbid conditions, and  other considerations. . Currently, no consensus exists regarding use of hemoglobin A1c for diagnosis of diabetes for children. Aaron Aas   09/10/2020 09:25 AM 6.4 (H) <5.7 % of total Hgb Final    Comment:    For someone without known diabetes, a hemoglobin  A1c value between 5.7% and 6.4% is consistent with prediabetes and should be confirmed with a  follow-up test. . For someone with known diabetes, a value <7% indicates that their diabetes is well controlled. A1c targets should be individualized based on duration of diabetes, age, comorbid conditions, and  other considerations. . This assay result is consistent with an increased risk of diabetes. . Currently, no consensus exists regarding use of hemoglobin A1c for diagnosis of diabetes for children. .     CBG: Recent Labs  Lab 05/14/23 1613 05/14/23 1932 05/14/23 2313 05/15/23 0400 05/15/23 0741  GLUCAP 156* 162* 138* 142* 132*    Review of Systems:    10 point ROS conducted and is negative except as per HPI and subjective findings  Past Medical History  He,  has a past medical history of Arthritis, Atrial fibrillation (HCC), CHF (congestive heart failure) (HCC), CKD (chronic kidney disease), stage III (HCC), Complication of anesthesia, Diabetes mellitus without complication (HCC), GERD (gastroesophageal reflux disease), Hypertension, Hypothyroidism, and Nephrolithiasis.   Surgical History    Past Surgical History:  Procedure Laterality Date   ANTERIOR CERVICAL DECOMP/DISCECTOMY FUSION N/A 05/11/2023   Procedure: ANTERIOR CERVICAL DECOMPRESSION/DISCECTOMY FUSION 2 LEVELS;  Surgeon: Jodeen Munch, MD;  Location: [facility];  Service: Neurosurgery;  Laterality: N/A;   CATARACT EXTRACTION W/PHACO Right 05/07/2021   Procedure: CATARACT EXTRACTION PHACO AND INTRAOCULAR LENS PLACEMENT (IOC) RIGHT DIABETIC 8.63 00:56.5;  Surgeon: Clair Crews, MD;  Location: [facility];  Service: Ophthalmology;  Laterality: Right;  Diabetic   CATARACT EXTRACTION W/PHACO Left 05/21/2021   Procedure: CATARACT EXTRACTION PHACO AND INTRAOCULAR LENS PLACEMENT (IOC) LEFT DIABETIC 7.27 01:01.5;  Surgeon: Clair Crews, MD;  Location: [facility];  Service: Ophthalmology;  Laterality: Left;  Diabetic   CORONARY STENT INTERVENTION N/A 03/08/2020   Procedure: CORONARY STENT INTERVENTION;  Surgeon: Antonette Batters, MD;  Location: [facility];  Service: Cardiovascular;  Laterality: N/A;   LEFT HEART CATH AND CORONARY ANGIOGRAPHY Left 03/08/2020   Procedure: LEFT HEART CATH  AND CORONARY ANGIOGRAPHY;  Surgeon: Antonette Batters, MD;  Location: [facility];  Service: Cardiovascular;  Laterality: Left;   NASAL FRACTURE SURGERY  1994   URETEROLITHOTOMY Right 2015     Social History   reports that he has never smoked. He has never used smokeless tobacco. He reports that he does not drink alcohol and does not use drugs.   Family History   His family history includes Heart disease in his father.   Allergies Allergies  Allergen Reactions   Codeine Rash and Other (See Comments)    Per pt "hard on my kidneys"     Lovastatin Rash and Other (See Comments)   Tape Rash     Home Medications  Prior to Admission medications   Medication Sig Start Date End Date Taking? Authorizing Provider  acetaminophen  (TYLENOL ) 650 MG CR tablet Take 650 mg by mouth every 8 (eight) hours as needed for pain.   Yes [provider]  atorvastatin  (LIPITOR) 10 MG tablet Take 1 tablet (10 mg total) by mouth daily at 6 PM. 02/02/23  Yes Sowles, Krichna, MD  carvedilol  (COREG ) 3.125 MG tablet Take 1 tablet (3.125 mg total) by mouth 2 (two) times daily with a meal. 12/25/19 05/10/23 Yes Chatterjee, Srobona Tublu, MD  clopidogrel  (PLAVIX ) 75 MG tablet Take 75 mg by mouth daily. 03/04/23  Yes [provider]  furosemide  (LASIX ) 40 MG tablet Take 40 mg by mouth daily. 05/03/23  Yes [provider]  glipiZIDE  2.5 MG TABS Take 1 tablet by mouth daily with breakfast. 02/02/23  Yes Sowles, Krichna, MD  levothyroxine  (SYNTHROID ) 25 MCG tablet TAKE 1 TABLET (25 MCG TOTAL) BY MOUTH DAILY BEFORE BREAKFAST. ON SUNDAYS, TAKE 2 TABLETS BY MOUTH BEFORE BREAKFAST. 04/27/23  Yes Sowles, Krichna, MD  losartan  (COZAAR ) 50 MG tablet Take 50 mg by mouth daily. 05/03/23  Yes [provider]  magnesium  oxide (MAG-OX) 400 MG tablet Take 400 mg by mouth daily.   Yes [provider]  metaxalone (SKELAXIN) 800 MG tablet May take 1/2-1 whole tablet up to 2 times daily as  needed for pain/spasm. 04/14/22  Yes [provider]  metFORMIN  (GLUCOPHAGE -XR) 750 MG 24 hr tablet Take 2 tablets (1,500  mg total) by mouth daily with breakfast. 02/02/23  Yes Sowles, Krichna, MD  pantoprazole  (PROTONIX ) 40 MG tablet Take 1 tablet (40 mg total) by mouth daily. 11/10/22 11/05/23 Yes Brigitte Canard, PA-C  rOPINIRole  (REQUIP ) 1 MG tablet Take 1 tablet (1 mg total) by mouth 2 (two) times daily. 02/02/23  Yes Sowles, Krichna, MD  spironolactone (ALDACTONE) 25 MG tablet Take 12.5 mg by mouth daily.   Yes Callwood, Dwayne D, MD  triamcinolone  (KENALOG ) 0.1 % Apply 1 application topically 2 (two) times daily as needed (rash).   Yes [provider]  aluminum hydroxide-magnesium  carbonate (GAVISCON) 95-358 MG/15ML SUSP Take by mouth.    [provider]  blood glucose meter kit and supplies 1 each by Other route as directed. Dispense based on patient and insurance preference. Use up to four times daily as directed. (FOR ICD-10 E10.9, E11.9). 09/09/21   Sowles, Krichna, MD  Cholecalciferol (VITAMIN D -1000 MAX ST) 25 MCG (1000 UT) tablet Take 1 tablet by mouth daily.    [provider]  Cyanocobalamin (B-12) 1000 MCG TBCR Take 1,000 mcg by mouth every other day. 01/02/20   [provider]  dapagliflozin  propanediol (FARXIGA ) 10 MG TABS tablet Take by mouth. 01/10/22   [provider]  docusate sodium  (COLACE) 50 MG capsule Take 100 mg by mouth 2 (two) times daily as needed for mild constipation.    [provider]  fexofenadine-pseudoephedrine (ALLEGRA-D 24) 180-240 MG 24 hr tablet Take 1 tablet by mouth daily as needed (allergies).    [provider]  furosemide  (LASIX ) 20 MG tablet Take 40 mg by mouth daily. Patient not taking: Reported on 05/10/2023 01/02/20   Callwood, Creig Doe D, MD  glucose blood (ONETOUCH ULTRA) test strip USE AS DIRECTED TO CHECK BLOOD GLUCOSE ONCE DAILY. 05/12/22   Sowles, Krichna, MD  Lancets [facility]  DELICA PLUS [place]) MISC CHECK BLOOD SUGAR AT NOON 03/02/23   Sowles, Krichna, MD  losartan  (COZAAR ) 25 MG tablet Take 1 tablet (25 mg total) by mouth at bedtime. Patient not taking: Reported on 05/10/2023 12/25/19   Chatterjee, Srobona Tublu, MD  Nutritional Supplements (THERALITH XR PO) Take by mouth QID. 3.752-45-45-49.5mg . Take 2 in the evening and 2 at bedtime.    [provider]  polyethylene glycol powder (GLYCOLAX /MIRALAX ) 17 GM/SCOOP powder Take 17 g by mouth daily. 11/10/22   Brigitte Canard, PA-C  tamsulosin  (FLOMAX ) 0.4 MG CAPS capsule Take 1 capsule (0.4 mg total) by mouth every evening. 09/08/22   Geraline Knapp, MD        Assessment & Plan:  #Cervical Spine Injury in the setting of traumatic fall MRI Cervical spine shows chronic compression and stenosis at C3/4 and a new fracture/disc herniation Ligamentous injury with edema at C5/6 with moderate stenosis  -maintain C-Spine precaution -Start pressors for MAP augmentation goal >85 mm- currently on neo -Frequent neuro checks -PRN Pain management -Keep NPO -Hold anticoagulation -Neurosurgery consult- s/p surgery   #Chronic HFrEF/Ischemic cardiomyopathy:  EF 40-45%  #Chronic A-Fib not on anticoagulation due to gross hematuria Hx:CAD s/p PCI and stent, HTN, HLD No vascular congestion on CXR yesterday. Appears euvolemic -Hold ? blockers /GDMT -will hold off on acei/arb/arni at this time to augment BP  -Continue Atorvastatin  once able to take po  #AKI on CKD Stage III -Monitor I&O's / urinary output -Follow BMP -Ensure adequate renal perfusion -Avoid nephrotoxic agents as able -Replace electrolytes as indicated   #Diabetes Mellitus -check hemoglobin A1c -CBGs q4 -Sliding scale insulin  -Follow ICU  hyper/hypoglycemia protocol -Hold home Metformin  & Glipizide    #Hypothyroidism -Continue home synthroid   #RLS -Continue Requip   Best practice:  Diet:  NPO Pain/Anxiety/Delirium protocol (if indicated):  No VAP protocol (if indicated): Not indicated DVT prophylaxis: Contraindicated GI prophylaxis: PPI Glucose control:  SSI Yes Central venous access:  N/A Arterial line:  N/A Foley:  N/A Mobility:  bed rest  PT consulted: N/A Last date of multidisciplinary goals of care discussion [updated family at the bedside] Code Status:  full code Disposition: ICU   = Goals of Care = Code Status Order: full  Primary Emergency ContactOziel, [place], Home Phone: [phone] Patient and family wishes to pursue full aggressive treatment and intervention options, including CPR and intubation  Critical care provider statement:   Total critical care time: 32 minutes   Performed by: Jaclynn Mast MD   Critical care time was exclusive of separately billable procedures and treating other patients.   Critical care was necessary to treat or prevent imminent or life-threatening deterioration.   Critical care was time spent personally by me on the following activities: development of treatment plan with patient and/or surrogate as well as nursing, discussions with consultants, evaluation of patient's response to treatment, examination of patient, obtaining history from patient or surrogate, ordering and performing treatments and interventions, ordering and review of laboratory studies, ordering and review of radiographic studies, pulse oximetry and re-evaluation of patient's condition.    Dandrea Medders, M.D.  Pulmonary & Critical Care Medicine

## 2023-05-15 NOTE — Progress Notes (Signed)
*  PRELIMINARY RESULTS* Echocardiogram 2D Echocardiogram has been performed.  Billy Franco 05/15/2023, 9:50 AM

## 2023-05-15 NOTE — Progress Notes (Signed)
 PHARMACY CONSULT NOTE - FOLLOW UP  Pharmacy Consult for Electrolyte Monitoring and Replacement   Recent Labs: Potassium (mmol/L)  Date Value  05/15/2023 3.1 (L)  08/17/2013 4.1   Magnesium  (mg/dL)  Date Value  86/57/8469 2.2  08/15/2013 1.8   Calcium  (mg/dL)  Date Value  62/95/2841 8.2 (L)   Calcium , Total (mg/dL)  Date Value  32/44/0102 8.7   Albumin (g/dL)  Date Value  72/53/6644 2.8 (L)  10/30/2014 4.4   Phosphorus (mg/dL)  Date Value  03/47/4259 2.0 (L)   Sodium (mmol/L)  Date Value  05/15/2023 140  10/30/2014 140  08/17/2013 138     Assessment: 83 y.o. male  with a past medical history of CAD s/p stent, chronic systolic congestive heart failure (EF 25% - 30% - 04/2020), Ischemic Cardiomyopathy (refuses AICD), AF RVR (not on Eliquis  due to hematuria), T2DM, CKD, hypothyroidism who presented to the ED on 05/10/2023 for falling out of a chair.   Goal of Therapy:  Electrolytes WNL  Plan:  ---20 mmol IV potassium phosphate x 1 (contains 29.5 mEq IV potassium) per NP ---recheck electrolytes in am  Billy Franco ,PharmD Clinical Pharmacist 05/15/2023 7:15 AM

## 2023-05-15 NOTE — Progress Notes (Signed)
 RT at bedside extubating pt. Pt tolerated well and placed on 2L [place]. VSS. Pt has strong cough. Wife brought back to bedside.

## 2023-05-15 NOTE — Progress Notes (Signed)
 Pt. Extubated to 2lnc,no apprarent distress noted at this time.

## 2023-05-15 NOTE — Progress Notes (Signed)
 PHARMACY CONSULT NOTE - FOLLOW UP  Pharmacy Consult for Electrolyte Monitoring and Replacement   Recent Labs: Potassium (mmol/L)  Date Value  05/15/2023 3.1 (L)  08/17/2013 4.1   Magnesium  (mg/dL)  Date Value  62/95/2841 2.2  08/15/2013 1.8   Calcium  (mg/dL)  Date Value  32/44/0102 8.2 (L)   Calcium , Total (mg/dL)  Date Value  72/53/6644 8.7   Albumin (g/dL)  Date Value  03/47/4259 2.8 (L)  10/30/2014 4.4   Phosphorus (mg/dL)  Date Value  56/38/7564 2.0 (L)   Sodium (mmol/L)  Date Value  05/15/2023 140  10/30/2014 140  08/17/2013 138     Assessment: 4/25:  Phos = 2.0,  Na = 140,  K = 3.1 -  Pt is NPO   Goal of Therapy:  Electrolytes WNL   Plan:  KPhos 20 mmol IV X ordered for 04/25 @ 0800.   Alvenia Job ,PharmD Clinical Pharmacist 05/15/2023 7:03 AM

## 2023-05-15 NOTE — Progress Notes (Addendum)
   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 Swelling in neck improved POD3: Pt reintubated yesterday for respiratory concerns.  POD2: Pt had some increased pain and vertgo like symptoms yesterday as well as tachycardia and agitation overnight requiring precedex   POD1: Pt reporting continued weakness in his hands but improved leg sensation and proximal upper extremity moving. Swallowing well with minimal pain   Physical Exam: Vitals:   05/15/23 0823 05/15/23 0900  BP:    Pulse: (!) 42 (!) 56  Resp: 16 (!) 21  Temp:    SpO2: 100% 100%   Intubated but follows commands x 4 PERRL Incision intact. Swelling around drain site improving. 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 Ok to remove when in bed and when eating (when able) - remainder of care per CC team  Anastacio Karvonen PA-C Department of Neurosurgery

## 2023-05-15 NOTE — Progress Notes (Signed)
 [facility] CLINIC CARDIOLOGY PROGRESS NOTE       Patient ID: Billy Franco MRN: [id number] DOB/AGE: Nov 29, 1940 83 y.o.  Admit date: 05/10/2023 Referring Physician Dr. Duayne Gey Primary Physician Ava Lei, Arvis Laura, MD Primary Cardiologist Dr. Beau Bound Reason for Consultation Cardiac Clearance   HPI: Billy Franco is a 83 y.o. male  with a past medical history of CAD s/p stent, chronic systolic congestive heart failure (EF 25% - 30% - 04/2020), Ischemic Cardiomyopathy (refuses AICD), AF RVR (not on Eliquis  due to hematuria), T2DM, CKD, hypothyroidism who presented to the ED on 05/10/2023 for falling out of a chair on Friday. Patient is requiring surgical intervention and needs cardiac clearance. Cardiology was consulted for further evaluation.   Interval history: -Patient seen and examined this AM, remains intubated and sedated.  -Bradycardic in the 40-50s on tele this AM.  -Cr continues to improve.   Review of systems complete and found to be negative unless listed above    Past Medical History:  Diagnosis Date   Arthritis    Lower back, hips   Atrial fibrillation (HCC)    CHF (congestive heart failure) (HCC)    CKD (chronic kidney disease), stage III (HCC)    Complication of anesthesia    has "crooked airway"  Diff breathing after extubated (after kidney stone procedure 07/2013)   Diabetes mellitus without complication (HCC)    GERD (gastroesophageal reflux disease)    Hypertension    Hypothyroidism    Nephrolithiasis     Past Surgical History:  Procedure Laterality Date   ANTERIOR CERVICAL DECOMP/DISCECTOMY FUSION N/A 05/11/2023   Procedure: ANTERIOR CERVICAL DECOMPRESSION/DISCECTOMY FUSION 2 LEVELS;  Surgeon: Jodeen Munch, MD;  Location: [facility] ORS;  Service: Neurosurgery;  Laterality: N/A;   CATARACT EXTRACTION W/PHACO Right 05/07/2021   Procedure: CATARACT EXTRACTION PHACO AND INTRAOCULAR LENS PLACEMENT (IOC) RIGHT DIABETIC 8.63 00:56.5;  Surgeon: Clair Crews, MD;   Location: [facility];  Service: Ophthalmology;  Laterality: Right;  Diabetic   CATARACT EXTRACTION W/PHACO Left 05/21/2021   Procedure: CATARACT EXTRACTION PHACO AND INTRAOCULAR LENS PLACEMENT (IOC) LEFT DIABETIC 7.27 01:01.5;  Surgeon: Clair Crews, MD;  Location: [facility];  Service: Ophthalmology;  Laterality: Left;  Diabetic   CORONARY STENT INTERVENTION N/A 03/08/2020   Procedure: CORONARY STENT INTERVENTION;  Surgeon: Antonette Batters, MD;  Location: [facility] INVASIVE CV LAB;  Service: Cardiovascular;  Laterality: N/A;   LEFT HEART CATH AND CORONARY ANGIOGRAPHY Left 03/08/2020   Procedure: LEFT HEART CATH AND CORONARY ANGIOGRAPHY;  Surgeon: Antonette Batters, MD;  Location: [facility] INVASIVE CV LAB;  Service: Cardiovascular;  Laterality: Left;   NASAL FRACTURE SURGERY  1994   URETEROLITHOTOMY Right 2015    Medications Prior to Admission  Medication Sig Dispense Refill Last Dose/Taking   acetaminophen  (TYLENOL ) 650 MG CR tablet Take 650 mg by mouth every 8 (eight) hours as needed for pain.   05/10/2023 Morning   aluminum hydroxide-magnesium  carbonate (GAVISCON) 95-358 MG/15ML SUSP Take by mouth.   Taking   atorvastatin  (LIPITOR) 10 MG tablet Take 1 tablet (10 mg total) by mouth daily at 6 PM. 90 tablet 1 05/09/2023 Evening   carvedilol  (COREG ) 3.125 MG tablet Take 1 tablet (3.125 mg total) by mouth 2 (two) times daily with a meal. 60 tablet 0 05/10/2023 Morning   Cholecalciferol (VITAMIN D -1000 MAX ST) 25 MCG (1000 UT) tablet Take 1 tablet by mouth daily.   Taking   clopidogrel  (PLAVIX ) 75 MG tablet Take 75 mg by mouth daily.   05/10/2023 Morning   Cyanocobalamin (  B-12) 1000 MCG TBCR Take 1,000 mcg by mouth every other day.   05/10/2023 Morning   docusate sodium  (COLACE) 50 MG capsule Take 100 mg by mouth 2 (two) times daily as needed for mild constipation.   Unknown   fexofenadine-pseudoephedrine (ALLEGRA-D 24) 180-240 MG 24 hr tablet Take 1 tablet by mouth daily as needed  (allergies).   Unknown   furosemide  (LASIX ) 40 MG tablet Take 40 mg by mouth daily.   05/10/2023 Morning   glipiZIDE  2.5 MG TABS Take 1 tablet by mouth daily with breakfast. 90 tablet 1 Taking   levothyroxine  (SYNTHROID ) 25 MCG tablet TAKE 1 TABLET (25 MCG TOTAL) BY MOUTH DAILY BEFORE BREAKFAST. ON SUNDAYS, TAKE 2 TABLETS BY MOUTH BEFORE BREAKFAST. 102 tablet 1 05/10/2023 Morning   losartan  (COZAAR ) 25 MG tablet Take 1 tablet (25 mg total) by mouth at bedtime. 30 tablet 0 05/09/2023 Bedtime   magnesium  oxide (MAG-OX) 400 MG tablet Take 400 mg by mouth daily.   05/09/2023 Evening   metaxalone (SKELAXIN) 800 MG tablet Take 400 mg by mouth 2 (two) times daily.   05/09/2023 Bedtime   metFORMIN  (GLUCOPHAGE -XR) 750 MG 24 hr tablet Take 2 tablets (1,500 mg total) by mouth daily with breakfast. 180 tablet 1 05/10/2023 Morning   Nutritional Supplements (THERALITH XR PO) Take by mouth QID. 3.752-45-45-49.5mg . Take 2 in the evening and 2 at bedtime.   05/10/2023 Morning   pantoprazole  (PROTONIX ) 40 MG tablet Take 1 tablet (40 mg total) by mouth daily. 90 tablet 3 05/09/2023 Evening   polyethylene glycol powder (GLYCOLAX /MIRALAX ) 17 GM/SCOOP powder Take 17 g by mouth daily.   Unknown   rOPINIRole  (REQUIP ) 1 MG tablet Take 1 tablet (1 mg total) by mouth 2 (two) times daily. 180 tablet 1 05/09/2023 Bedtime   spironolactone (ALDACTONE) 25 MG tablet Take 12.5 mg by mouth daily.   05/10/2023   tamsulosin  (FLOMAX ) 0.4 MG CAPS capsule Take 1 capsule (0.4 mg total) by mouth every evening. 90 capsule 2 05/09/2023 Evening   triamcinolone  (KENALOG ) 0.1 % Apply 1 application topically 2 (two) times daily as needed (rash).   Unknown   blood glucose meter kit and supplies 1 each by Other route as directed. Dispense based on patient and insurance preference. Use up to four times daily as directed. (FOR ICD-10 E10.9, E11.9). 1 each 1    glucose blood (ONETOUCH ULTRA) test strip USE AS DIRECTED TO CHECK BLOOD GLUCOSE ONCE DAILY. 100 strip  3    Lancets (ONETOUCH DELICA PLUS LANCET33G) MISC CHECK BLOOD SUGAR AT NOON 100 each 1    Social History   Socioeconomic History   Marital status: Married    Spouse name: Sport and exercise psychologist    Number of children: 3   Years of education: Not on file   Highest education level: Not on file  Occupational History   Occupation: retired     Comment: IT trainer at a [employer]   Tobacco Use   Smoking status: Never   Smokeless tobacco: Never  Vaping Use   Vaping status: Never Used  Substance and Sexual Activity   Alcohol use: No    Alcohol/week: 0.0 standard drinks of alcohol   Drug use: No   Sexual activity: Not Currently  Other Topics Concern   Not on file  Social History Narrative   Married, had 3 children, one died at a MVA at age 18   Geralynn Knife of a church    Social Drivers of Corporate investment banker Strain: Low Risk  (  01/24/2019)   Overall Financial Resource Strain (CARDIA)    Difficulty of Paying Living Expenses: Not hard at all  Food Insecurity: No Food Insecurity (05/11/2023)   Hunger Vital Sign    Worried About Running Out of Food in the Last Year: Never true    Ran Out of Food in the Last Year: Never true  Transportation Needs: No Transportation Needs (05/11/2023)   PRAPARE - Administrator, Civil Service (Medical): No    Lack of Transportation (Non-Medical): No  Physical Activity: Sufficiently Active (01/24/2019)   Exercise Vital Sign    Days of Exercise per Week: 5 days    Minutes of Exercise per Session: 60 min  Stress: No Stress Concern Present (01/24/2019)   [employer] of Occupational Health - Occupational Stress Questionnaire    Feeling of Stress : Not at all  Social Connections: Unknown (05/11/2023)   Social Connection and Isolation Panel [NHANES]    Frequency of Communication with Friends and Family: More than three times a week    Frequency of Social Gatherings with Friends and Family: More than three times a week    Attends Religious Services: More  than 4 times per year    Active Member of [employer] or Organizations: Not on file    Attends Banker Meetings: Not on file    Marital Status: Married  Intimate Partner Violence: Not At Risk (05/11/2023)   Humiliation, Afraid, Rape, and Kick questionnaire    Fear of Current or Ex-Partner: No    Emotionally Abused: No    Physically Abused: No    Sexually Abused: No    Family History  Problem Relation Age of Onset   Heart disease Father      Vitals:   05/15/23 0441 05/15/23 0500 05/15/23 0600 05/15/23 0700  BP:      Pulse:  (!) 48 (!) 51 (!) 41  Resp:  18 13 18   Temp:      TempSrc:      SpO2: 100% 100% 100% 100%  Weight:  74 kg    Height:        PHYSICAL EXAM General: Ill appearing elderly male, intubated and sedated. HEENT: Normocephalic and atraumatic. Neck: No JVD.   Lungs: Mechanical breath sounds. Heart: HRRR, slow rate. Normal S1 and S2 without gallops or murmurs.  Abdomen: Non-distended appearing.  Msk: Normal strength and tone for age. Extremities: Warm and well perfused. No clubbing, cyanosis. No edema.    Labs: Basic Metabolic Panel: Recent Labs    05/14/23 0434 05/15/23 0402  NA 139 140  K 3.6 3.1*  CL 105 102  CO2 21* 27  GLUCOSE 285* 145*  BUN 47* 45*  CREATININE 2.03* 1.64*  CALCIUM  8.6* 8.2*  MG 2.3 2.2  PHOS 3.5 2.0*   Liver Function Tests: Recent Labs    05/13/23 1605  AST 40  ALT 13  ALKPHOS 49  BILITOT 1.0  PROT 5.8*  ALBUMIN 2.8*   No results for input(s): "LIPASE", "AMYLASE" in the last 72 hours. CBC: Recent Labs    05/14/23 0434 05/15/23 0402  WBC 13.1* 9.5  HGB 11.1* 9.9*  HCT 33.8* 28.7*  MCV 98.3 94.4  PLT 272 214   Cardiac Enzymes: Recent Labs    05/13/23 1605 05/13/23 1812  TROPONINIHS 356* 339*   BNP: No results for input(s): "BNP" in the last 72 hours. D-Dimer: No results for input(s): "DDIMER" in the last 72 hours. Hemoglobin A1C: No results for input(s): "HGBA1C" in  the last 72  hours. Fasting Lipid Panel: Recent Labs    05/14/23 0434  TRIG 107   Thyroid  Function Tests: Recent Labs    05/13/23 1812  TSH 2.090  T4TOTAL 5.9   Anemia Panel: No results for input(s): "VITAMINB12", "FOLATE", "FERRITIN", "TIBC", "IRON", "RETICCTPCT" in the last 72 hours.   Radiology: CT CHEST WO CONTRAST Result Date: 05/13/2023 CLINICAL DATA:  Spinal cord injury aspiration follow-up EXAM: CT CHEST WITHOUT CONTRAST TECHNIQUE: Multidetector CT imaging of the chest was performed following the standard protocol without IV contrast. RADIATION DOSE REDUCTION: This exam was performed according to the departmental dose-optimization program which includes automated exposure control, adjustment of the mA and/or kV according to patient size and/or use of iterative reconstruction technique. COMPARISON:  Radiograph 05/13/2023 FINDINGS: Cardiovascular: Limited evaluation without intravenous contrast. Mild aortic atherosclerosis. No aneurysm. Left upper extremity central venous catheter with tip at the cavoatrial junction. Coronary vascular calcification. Cardiomegaly. Trace pericardial effusion Mediastinum/Nodes: Endotracheal tube with tip several cm superior to the carina. Mild debris within the distal trachea and right bronchus. Multiple calcified lymph nodes consistent with prior granulomatous disease. Esophagus is unremarkable Lungs/Pleura: Trace left and small moderate right pleural effusions. Mild dependent atelectasis in the lower lobes. No pneumothorax Upper Abdomen: No acute finding. Left renal pelvis stone measuring about 21 mm. Musculoskeletal: Sternum appears intact. No acute osseous abnormality. IMPRESSION: 1. Small left and small to moderate right pleural effusions. Mild dependent atelectasis in the lungs. Mild debris or secretions in the distal trachea and right bronchus. 2. Cardiomegaly.  Trace pericardial effusion. 3. Evidence of prior granulomatous disease. 4. Left kidney stone Aortic  Atherosclerosis (ICD10-I70.0). Electronically Signed   By: Esmeralda Hedge M.D.   On: 05/13/2023 23:16   CT CERVICAL SPINE WO CONTRAST Result Date: 05/13/2023 CLINICAL DATA:  Altered mental status EXAM: CT HEAD WITHOUT CONTRAST CT CERVICAL SPINE WITHOUT CONTRAST TECHNIQUE: Multidetector CT imaging of the head and cervical spine was performed following the standard protocol without intravenous contrast. Multiplanar CT image reconstructions of the cervical spine were also generated. RADIATION DOSE REDUCTION: This exam was performed according to the departmental dose-optimization program which includes automated exposure control, adjustment of the mA and/or kV according to patient size and/or use of iterative reconstruction technique. COMPARISON:  05/10/2023 FINDINGS: CT HEAD FINDINGS Brain: No mass,hemorrhage or extra-axial collection. Unchanged hypodensity within the right PCA territory. Vascular: No hyperdense vessel or unexpected vascular calcification. Skull: The visualized skull base, calvarium and extracranial soft tissues are normal. Sinuses/Orbits: No fluid levels or advanced mucosal thickening of the visualized paranasal sinuses. No mastoid or middle ear effusion. Normal orbits. Other: None. CT CERVICAL SPINE FINDINGS Alignment: No static subluxation. Facets are aligned. Occipital condyles are normally positioned. Skull base and vertebrae: C3-5 ACDF.  No acute fracture. Soft tissues and spinal canal: No prevertebral fluid or swelling. No visible canal hematoma. Disc levels: C2-3: Moderate spinal canal stenosis. C3-4: Severe spinal canal stenosis. C4-5: Mild spinal canal stenosis. C5-6: Unremarkable. C6-7: Mild spinal canal stenosis. Upper chest: No pneumothorax, pulmonary nodule or pleural effusion. Other: Normal visualized paraspinal cervical soft tissues. IMPRESSION: 1. No acute intracranial abnormality. 2. No acute fracture or static subluxation of the cervical spine. 3. 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 Electronically Signed   By: Juanetta Nordmann M.D.   On: 05/13/2023 22:59   CT HEAD WO CONTRAST ( ) Result Date: 05/13/2023 CLINICAL DATA:  Altered mental status EXAM: CT HEAD WITHOUT CONTRAST CT CERVICAL SPINE WITHOUT CONTRAST TECHNIQUE: Multidetector CT  imaging of the head and cervical spine was performed following the standard protocol without intravenous contrast. Multiplanar CT image reconstructions of the cervical spine were also generated. RADIATION DOSE REDUCTION: This exam was performed according to the departmental dose-optimization program which includes automated exposure control, adjustment of the mA and/or kV according to patient size and/or use of iterative reconstruction technique. COMPARISON:  05/10/2023 FINDINGS: CT HEAD FINDINGS Brain: No mass,hemorrhage or extra-axial collection. Unchanged hypodensity within the right PCA territory. Vascular: No hyperdense vessel or unexpected vascular calcification. Skull: The visualized skull base, calvarium and extracranial soft tissues are normal. Sinuses/Orbits: No fluid levels or advanced mucosal thickening of the visualized paranasal sinuses. No mastoid or middle ear effusion. Normal orbits. Other: None. CT CERVICAL SPINE FINDINGS Alignment: No static subluxation. Facets are aligned. Occipital condyles are normally positioned. Skull base and vertebrae: C3-5 ACDF.  No acute fracture. Soft tissues and spinal canal: No prevertebral fluid or swelling. No visible canal hematoma. Disc levels: C2-3: Moderate spinal canal stenosis. C3-4: Severe spinal canal stenosis. C4-5: Mild spinal canal stenosis. C5-6: Unremarkable. C6-7: Mild spinal canal stenosis. Upper chest: No pneumothorax, pulmonary nodule or pleural effusion. Other: Normal visualized paraspinal cervical soft tissues. IMPRESSION: 1. No acute intracranial abnormality. 2. No acute fracture or static subluxation of the cervical spine. 3. C3-5 ACDF with severe spinal canal stenosis at  C3-4. Electronically Signed   By: Juanetta Nordmann M.D.   On: 05/13/2023 22:59   DG Chest Port 1 View Result Date: 05/13/2023 CLINICAL DATA:  Status post intubation EXAM: PORTABLE CHEST 1 VIEW COMPARISON:  May 10, 2023 FINDINGS: The heart size and mediastinal contours are within normal limits. Both lungs are clear. The visualized skeletal structures are unremarkable. Tip of the endotracheal tube 2 cm from carina Left PICC line tip in the SVC IMPRESSION: No active disease. Electronically Signed   By: Fredrich Jefferson M.D.   On: 05/13/2023 16:14   US  EKG SITE RITE Result Date: 05/12/2023 If [facility], The image not attached, placement could not be confirmed due to current cardiac rhythm.  DG Cervical Spine 2-3 Views Result Date: 05/11/2023 CLINICAL DATA:  Elective surgery. EXAM: CERVICAL SPINE - 2-3 VIEW COMPARISON:  Preoperative imaging FINDINGS: Two fluoroscopic spot views of the cervical spine submitted from the operating room. Anterior fusion C3 through C5 with interbody spacers. Fluoroscopy time 4 seconds. Dose 0.29 mGy. IMPRESSION: Intraoperative fluoroscopy during cervical spine fusion. Electronically Signed   By: Chadwick Colonel M.D.   On: 05/11/2023 17:04   DG C-Arm 1-60 Min-No Report Result Date: 05/11/2023 Fluoroscopy was utilized by the requesting physician.  No radiographic interpretation.   DG C-Arm 1-60 Min-No Report Result Date: 05/11/2023 Fluoroscopy was utilized by the requesting physician.  No radiographic interpretation.   MR Cervical Spine Wo Contrast Result Date: 05/10/2023 CLINICAL DATA:  Ataxia, cervical trauma arm weakness, eval central cord EXAM: MRI CERVICAL SPINE WITHOUT CONTRAST TECHNIQUE: Multiplanar, multisequence MR imaging of the cervical spine was performed. No intravenous contrast was administered. COMPARISON:  None Available. FINDINGS: Alignment: No substantial sagittal subluxation. Vertebrae: No bone marrow edema to suggest acute fracture. Edema in the anterior C6-C7 disc  space with extension anteriorly and destruction of the anterior longitudinal ligament (see series 3, image 8). Adjacent prevertebral edema which extends inferiorly into the upper thoracic spine. Cord: T2 hyperintensity within the cord at C3-C4. Posterior Fossa, vertebral arteries, paraspinal tissues: The visualized 2 artery flow voids are maintained. Small volume of prevertebral edema in the lower cervical spine extending into the upper  thoracic spine. Disc levels: C2-C3: Small posterior disc osteophyte complex. Mild canal stenosis. Bilateral facet uncovertebral hypertrophy with mild left foraminal stenosis. C3-C4: Posterior disc osteophyte complex and ligamentum flavum thickening. Resulting severe canal stenosis with cord compression. Bilateral facet uncovertebral hypertrophy. Resulting severe bilateral foraminal stenosis. C4-C5: Posterior disc osteophyte complex and ligamentum flavum thickening. Resulting moderate to severe canal stenosis. Bilateral facet and vertebral hypertrophy with severe bilateral foraminal stenosis. C5-C6: Segmentation anomaly. Partial fusion across the disc. Bilateral facet uncovertebral hypertrophy. Canal and foramina are patent. C6-C7: Posterior disc osteophyte complex with central disc protrusion. Bilateral facet and uncovertebral hypertrophy. Moderate bilateral foraminal stenosis. Mild to moderate canal stenosis. C7-T1: Uncovertebral hypertrophy without significant canal or foraminal stenosis. IMPRESSION: 1. 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 Edema extends posteriorly to involve the C6-C7 disc, compatible with traumatic disc injury. Small volume of adjacent prevertebral edema. 2. At C3-C4, severe canal stenosis with cord compression. T2 hyperintensity in the cord at this level is compatible with cord edema and/or myelomalacia. Severe bilateral foraminal stenosis at this level. 3. At C4-C5, moderate to severe canal stenosis and severe bilateral foraminal stenosis.  4. At C6-C7, mild to moderate canal stenosis and moderate bilateral foraminal stenosis. Electronically Signed   By: Stevenson Elbe M.D.   On: 05/10/2023 20:41   MR BRAIN WO CONTRAST Result Date: 05/10/2023 CLINICAL DATA:  Neuro deficit, acute, stroke suspected left sided weakness EXAM: MRI HEAD WITHOUT CONTRAST TECHNIQUE: Multiplanar, multiecho pulse sequences of the brain and surrounding structures were obtained without intravenous contrast. COMPARISON:  CT head and CT cervical spine from earlier today. FINDINGS: Brain: No acute infarction, hemorrhage, hydrocephalus, extra-axial collection or mass lesion. Remote right PCA territory infarct. Vascular: Major arterial flow voids are maintained at the skull base. Skull and upper cervical spine: Normal marrow signal. Sinuses/Orbits: Negative. IMPRESSION: 1. No evidence of acute intracranial abnormality. 2. Remote right PCA territory infarct. Electronically Signed   By: Stevenson Elbe M.D.   On: 05/10/2023 20:22   CT Thoracic Spine Wo Contrast Result Date: 05/10/2023 CLINICAL DATA:  Back trauma, fall from chair. EXAM: CT THORACIC AND LUMBAR SPINE WITHOUT CONTRAST TECHNIQUE: Multidetector CT imaging of the thoracic and lumbar spine was performed without contrast. Multiplanar CT image reconstructions were also generated. RADIATION DOSE REDUCTION: This exam was performed according to the departmental dose-optimization program which includes automated exposure control, adjustment of the mA and/or kV according to patient size and/or use of iterative reconstruction technique. COMPARISON:  10/08/2022, 04/28/2022 FINDINGS: CT THORACIC SPINE FINDINGS Alignment: Normal. Vertebrae: No acute fracture or focal pathologic process. Paraspinal and other soft tissues: No paraspinal fat stranding or epidural hematoma. Aortic atherosclerosis is noted. There are multi-vessel coronary artery calcifications. Pleural and parenchymal scarring is noted at the lung apices. There are  trace bilateral pleural effusions with atelectasis. Disc levels: Multilevel degenerative endplate changes. There is calcification of the supraspinous ligament. CT LUMBAR SPINE FINDINGS Segmentation: 5 lumbar type vertebrae. Alignment: Normal. Vertebrae: There is a old fracture of the L1, L2, and L3 transverse processes on the left with bone callus formation. No acute fracture is seen. Paraspinal and other soft tissues: No acute abnormality. Aortic atherosclerosis is seen. Left renal calculi are noted. Disc levels: Multi level degenerative endplate changes and facet arthropathy. There is a disc herniation at L4-L5 resulting in severe spinal canal stenosis and moderate to severe neural foraminal stenosis bilaterally, greater on the left than on the right. The spinal canal and neural foramina are otherwise patent. IMPRESSION: CT THORACIC SPINE  IMPRESSION 1. Multilevel degenerative changes without evidence of acute fracture. 2. Trace bilateral pleural effusions with atelectasis. 3. Aortic atherosclerosis with coronary artery calcifications. CT LUMBAR SPINE IMPRESSION 1. 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 2. Multilevel degenerative changes in the lumbar spine as described above. 3. Left renal calculi. Electronically Signed   By: Wyvonnia Heimlich M.D.   On: 05/10/2023 20:01   CT Lumbar Spine Wo Contrast Result Date: 05/10/2023 CLINICAL DATA:  Back trauma, fall from chair. EXAM: CT THORACIC AND LUMBAR SPINE WITHOUT CONTRAST TECHNIQUE: Multidetector CT imaging of the thoracic and lumbar spine was performed without contrast. Multiplanar CT image reconstructions were also generated. RADIATION DOSE REDUCTION: This exam was performed according to the departmental dose-optimization program which includes automated exposure control, adjustment of the mA and/or kV according to  patient size and/or use of iterative reconstruction technique. COMPARISON:  10/08/2022, 04/28/2022 FINDINGS: CT THORACIC SPINE FINDINGS Alignment: Normal. Vertebrae: No acute fracture or focal pathologic process. Paraspinal and other soft tissues: No paraspinal fat stranding or epidural hematoma. Aortic atherosclerosis is noted. There are multi-vessel coronary artery calcifications. Pleural and parenchymal scarring is noted at the lung apices. There are trace bilateral pleural effusions with atelectasis. Disc levels: Multilevel degenerative endplate changes. There is calcification of the supraspinous ligament. CT LUMBAR SPINE FINDINGS Segmentation: 5 lumbar type vertebrae. Alignment: Normal. Vertebrae: There is a old fracture of the L1, L2, and L3 transverse processes on the left with bone callus formation. No acute fracture is seen. Paraspinal and other soft tissues: No acute abnormality. Aortic atherosclerosis is seen. Left renal calculi are noted. Disc levels: Multi level degenerative endplate changes and facet arthropathy. There is a disc herniation at L4-L5 resulting in severe spinal canal stenosis and moderate to severe neural foraminal stenosis bilaterally, greater on the left than on the right. The spinal canal and neural foramina are otherwise patent. IMPRESSION: CT THORACIC SPINE IMPRESSION 1. Multilevel degenerative changes without evidence of acute fracture. 2. Trace bilateral pleural effusions with atelectasis. 3. Aortic atherosclerosis with coronary artery calcifications. CT LUMBAR SPINE IMPRESSION 1. 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 2. Multilevel degenerative changes in the lumbar spine as described above. 3. Left renal calculi. Electronically Signed   By: Wyvonnia Heimlich M.D.   On: 05/10/2023 20:01   CT Cervical Spine Wo Contrast Result Date:  05/10/2023 CLINICAL DATA:  Fall, weakness EXAM: CT CERVICAL SPINE WITHOUT CONTRAST TECHNIQUE: Multidetector CT imaging of the cervical spine was performed without intravenous contrast. Multiplanar CT image reconstructions were also generated. RADIATION DOSE REDUCTION: This exam was performed according to the departmental dose-optimization program which includes automated exposure control, adjustment of the mA and/or kV according to patient size and/or use of iterative reconstruction technique. COMPARISON:  None Available. FINDINGS: Alignment: No subluxation.  Facet alignment within normal limits. Skull base and vertebrae: No fracture Soft tissues and spinal canal: No prevertebral fluid or swelling. No visible canal hematoma. Disc levels: Prominent multilevel anterior osteophytes. Fusion of the C5-C6 vertebral bodies and posterior elements. Multilevel degenerative changes with osteophytes and mild disc space narrowing. Small central disc protrusion at C2-C3. At least mild canal stenosis. Posterior disc osteophyte complex at C3-C4 with moderate severe canal stenosis. Posterior disc osteophyte C4-C5 with at least moderate canal stenosis. Posterior disc osteophyte C6-C7 with at least  mild canal stenosis and mild mass effect on the anterior thecal sac. Multilevel facet degenerative change. Multilevel foraminal narrowing. Upper chest: Apical scarring Other: None IMPRESSION: 1. Negative for acute fracture or subluxation of the cervical spine. 2. 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 Electronically Signed   By: Esmeralda Hedge M.D.   On: 05/10/2023 17:03   DG Chest Port 1 View Result Date: 05/10/2023 CLINICAL DATA:  Weakness EXAM: PORTABLE CHEST 1 VIEW COMPARISON:  12/21/2019 FINDINGS: Pleural-parenchymal scarring at the apices. No consolidation, pleural effusion or pneumothorax. Borderline to mild cardiomegaly. Aortic atherosclerosis. IMPRESSION: No active disease.  Borderline to mild cardiomegaly. Electronically Signed   By: Esmeralda Hedge M.D.   On: 05/10/2023 16:55   CT Head Wo Contrast Result Date: 05/10/2023 CLINICAL DATA:  Head trauma weakness EXAM: CT HEAD WITHOUT CONTRAST TECHNIQUE: Contiguous axial images were obtained from the base of the skull through the vertex without intravenous contrast. RADIATION DOSE REDUCTION: This exam was performed according to the departmental dose-optimization program which includes automated exposure control, adjustment of the mA and/or kV according to patient size and/or use of iterative reconstruction technique. COMPARISON:  CT brain 05/02/2019 FINDINGS: Brain: Mild motion degradation limits the exam. Interval small chronic appearing right occipital infarct. Mild atrophy. Patchy white matter hypodensity most likely chronic small vessel ischemic change. The ventricles are non enlarged. Vascular: No hyperdense vessels.  No unexpected calcification Skull: No definitive fracture but limited by motion Sinuses/Orbits: No acute finding. Other: None IMPRESSION: 1. Mild motion degradation limits the exam. No definite CT evidence for acute intracranial abnormality. 2. Atrophy and chronic small vessel ischemic changes of the white matter. Interval small chronic appearing right occipital infarct, new compared to 2021 head CT. Electronically Signed   By: Esmeralda Hedge M.D.   On: 05/10/2023 16:53    ECHO: 05/14/2020  MODERATE LV SYSTOLIC DYSFUNCTION  MILD RV SYSTOLIC DYSFUNCTION  NO VALVULAR STENOSIS  MODERATE MR, TR  MILD AR, PR  EF 30%   TELEMETRY reviewed by me 05/15/2023: Sinus bradycardia rate 40-50s  EKG reviewed by me: Sinus rhythm with PACs, rate 67 bpm  Data reviewed by me 05/15/2023: last 24h vitals tele labs imaging I/O ED provider note, admission H&P, neurosurgery, critical care notes.  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 male  with a past medical history of CAD s/p stent (2022), chronic systolic congestive heart failure (EF 25% - 30% - 04/2020), Ischemic Cardiomyopathy (refuses AICD), AF RVR (not on Eliquis  due to hematuria), T2DM, CKD, hypothyroidism who presented to the ED on 05/10/2023 for falling out of a chair on Friday. Patient is requiring surgical intervention and needs cardiac clearance. Cardiology was consulted for further evaluation.   # Aspiration # Fall, Fracture/disc herniation at C5/6 with moderate stenosis # Paroxysmal Atrial fibrillation # Chronic systolic congestive heart failure (EF 25% - 30% - 04/2020)  # Ischemic Cardiomyopathy (refuses AICD) # CAD s/p stent placement Patient presents to the ED after falling from chair on Friday leading to a new fracture/disc herniation at C5/6 s/p surgical decompression 05/11/23. Patient has history of atrial fibrillation, in AF overnight 4/22. Patient is  not on DOAC due to history of hematuria. Had aspiration event on 4/23 requiring intubation. -Continue holding plavix , per neurosurgery may resume 14 days after surgery. Continue asa 81 mg daily in the interim. -Continue Atorvastatin  10 mg daily. -Will continue to hold off on any beta blockers as he remains bradycardic. Patient unable to tolerate DOAC due to hematuria.  -Plan to resume home GDMT post-op as BP and renal function allow. Cr downtrending, consider resuming ARB tomorrow if renal function remains stable. Has been unable to afford Entresto  in the past.  This patient's plan of care was discussed and created with Dr. Beau Bound and he is in agreement.    Signed: Hamp Levine, PA-C  05/15/2023, 7:58 AM [facility] Cardiology

## 2023-05-16 LAB — BASIC METABOLIC PANEL WITH GFR
Anion gap: 7 (ref 5–15)
BUN: 43 mg/dL — ABNORMAL HIGH (ref 8–23)
CO2: 30 mmol/L (ref 22–32)
Calcium: 8.1 mg/dL — ABNORMAL LOW (ref 8.9–10.3)
Chloride: 104 mmol/L (ref 98–111)
Creatinine, Ser: 1.64 mg/dL — ABNORMAL HIGH (ref 0.61–1.24)
GFR, Estimated: 42 mL/min — ABNORMAL LOW (ref >60)
Glucose, Bld: 130 mg/dL — ABNORMAL HIGH (ref 70–99)
Potassium: 3.2 mmol/L — ABNORMAL LOW (ref 3.5–5.1)
Sodium: 141 mmol/L (ref 135–145)

## 2023-05-16 LAB — CBC
HCT: 30.8 % — ABNORMAL LOW (ref 39.0–52.0)
Hemoglobin: 10.2 g/dL — ABNORMAL LOW (ref 13.0–17.0)
MCH: 32.5 pg (ref 26.0–34.0)
MCHC: 33.1 g/dL (ref 30.0–36.0)
MCV: 98.1 fL (ref 80.0–100.0)
Platelets: 207 10*3/uL (ref 150–400)
RBC: 3.14 MIL/uL — ABNORMAL LOW (ref 4.22–5.81)
RDW: 13.1 % (ref 11.5–15.5)
WBC: 8 10*3/uL (ref 4.0–10.5)
nRBC: 0.4 % — ABNORMAL HIGH (ref 0.0–0.2)

## 2023-05-16 LAB — MAGNESIUM: Magnesium: 2 mg/dL (ref 1.7–2.4)

## 2023-05-16 LAB — GLUCOSE, CAPILLARY
Glucose-Capillary: 111 mg/dL — ABNORMAL HIGH (ref 70–99)
Glucose-Capillary: 122 mg/dL — ABNORMAL HIGH (ref 70–99)
Glucose-Capillary: 124 mg/dL — ABNORMAL HIGH (ref 70–99)
Glucose-Capillary: 136 mg/dL — ABNORMAL HIGH (ref 70–99)
Glucose-Capillary: 139 mg/dL — ABNORMAL HIGH (ref 70–99)
Glucose-Capillary: 85 mg/dL (ref 70–99)

## 2023-05-16 LAB — PHOSPHORUS: Phosphorus: 3.8 mg/dL (ref 2.5–4.6)

## 2023-05-16 MED ORDER — ROPINIROLE HCL 1 MG PO TABS
1.0000 mg | ORAL_TABLET | Freq: Two times a day (BID) | ORAL | Status: DC
  Administered 2023-05-16 – 2023-05-22 (×12): 1 mg via ORAL
  Filled 2023-05-16 (×13): qty 1

## 2023-05-16 MED ORDER — LEVOTHYROXINE SODIUM 50 MCG PO TABS
50.0000 ug | ORAL_TABLET | ORAL | Status: DC
  Administered 2023-05-17: 50 ug via ORAL
  Filled 2023-05-16: qty 1

## 2023-05-16 MED ORDER — LEVOTHYROXINE SODIUM 25 MCG PO TABS
25.0000 ug | ORAL_TABLET | ORAL | Status: DC
  Administered 2023-05-18 – 2023-05-22 (×5): 25 ug via ORAL
  Filled 2023-05-16 (×5): qty 1

## 2023-05-16 MED ORDER — ACETAMINOPHEN 500 MG PO TABS
1000.0000 mg | ORAL_TABLET | Freq: Four times a day (QID) | ORAL | Status: DC
  Administered 2023-05-16 – 2023-05-22 (×17): 1000 mg via ORAL
  Filled 2023-05-16 (×20): qty 2

## 2023-05-16 MED ORDER — METHOCARBAMOL 1000 MG/10ML IJ SOLN: 500.0000 mg | Freq: Four times a day (QID) | INTRAMUSCULAR | Status: DC | PRN

## 2023-05-16 MED ORDER — POTASSIUM CHLORIDE 10 MEQ/100ML IV SOLN
10.0000 meq | INTRAVENOUS | Status: AC
Start: 2023-05-16 — End: 2023-05-16
  Administered 2023-05-16 (×4): 10 meq via INTRAVENOUS
  Filled 2023-05-16 (×4): qty 100

## 2023-05-16 MED ORDER — ATORVASTATIN CALCIUM 10 MG PO TABS
10.0000 mg | ORAL_TABLET | Freq: Every day | ORAL | Status: DC
  Administered 2023-05-17 – 2023-05-21 (×5): 10 mg via ORAL
  Filled 2023-05-16 (×5): qty 1

## 2023-05-16 NOTE — Plan of Care (Signed)
   Problem: Education: Goal: Ability to describe self-care measures that may prevent or decrease complications (Diabetes Survival Skills Education) will improve Outcome: Progressing Goal: Individualized Educational Video(s) Outcome: Progressing

## 2023-05-16 NOTE — Evaluation (Addendum)
 Clinical/Bedside Swallow Evaluation Patient Details  Name: Billy Franco MRN: [id number] Date of Birth: March 01, 1940  Today's Date: 05/16/2023 Time: SLP Start Time (ACUTE ONLY): 1230 SLP Stop Time (ACUTE ONLY): 1300 SLP Time Calculation (min) (ACUTE ONLY): 30 min  Past Medical History:  Past Medical History:  Diagnosis Date   Arthritis    Lower back, hips   Atrial fibrillation (HCC)    CHF (congestive heart failure) (HCC)    CKD (chronic kidney disease), stage III (HCC)    Complication of anesthesia    has "crooked airway"  Diff breathing after extubated (after kidney stone procedure 07/2013)   Diabetes mellitus without complication (HCC)    GERD (gastroesophageal reflux disease)    Hypertension    Hypothyroidism    Nephrolithiasis    Past Surgical History:  Past Surgical History:  Procedure Laterality Date   ANTERIOR CERVICAL DECOMP/DISCECTOMY FUSION N/A 05/11/2023   Procedure: ANTERIOR CERVICAL DECOMPRESSION/DISCECTOMY FUSION 2 LEVELS;  Surgeon: Jodeen Munch, MD;  Location: [facility] ORS;  Service: Neurosurgery;  Laterality: N/A;   CATARACT EXTRACTION W/PHACO Right 05/07/2021   Procedure: CATARACT EXTRACTION PHACO AND INTRAOCULAR LENS PLACEMENT (IOC) RIGHT DIABETIC 8.63 00:56.5;  Surgeon: Clair Crews, MD;  Location: [facility];  Service: Ophthalmology;  Laterality: Right;  Diabetic   CATARACT EXTRACTION W/PHACO Left 05/21/2021   Procedure: CATARACT EXTRACTION PHACO AND INTRAOCULAR LENS PLACEMENT (IOC) LEFT DIABETIC 7.27 01:01.5;  Surgeon: Clair Crews, MD;  Location: [facility];  Service: Ophthalmology;  Laterality: Left;  Diabetic   CORONARY STENT INTERVENTION N/A 03/08/2020   Procedure: CORONARY STENT INTERVENTION;  Surgeon: Antonette Batters, MD;  Location: [facility] INVASIVE CV LAB;  Service: Cardiovascular;  Laterality: N/A;   LEFT HEART CATH AND CORONARY ANGIOGRAPHY Left 03/08/2020   Procedure: LEFT HEART CATH AND CORONARY ANGIOGRAPHY;  Surgeon: Antonette Batters, MD;  Location: [facility] INVASIVE CV LAB;  Service: Cardiovascular;  Laterality: Left;   NASAL FRACTURE SURGERY  1994   URETEROLITHOTOMY Right 2015   HPI:  Per MD progress note "Billy Franco with significant PMH of  T2DM, CKD stage III, HTN, HLD, HFrEF with EF 25-30%, refused AICD, ischemic Cardiomyopathy, A-Fib  not on anticoagulation due to gross hematuria, CAD s/p PCI and stent, GERD, RLS and chronic back pain who presented to the ED with chief complaints of bilateral upper and lower extremities weakness and numbness s/p fall. Per family at the bedside, patient fell out of a folding chair on Friday and landed on his left side. Following the fall, he reported generalized weakness, numbness and difficulties grasping objects with his hands. He reported hitting his head without LOC." Neurosurgery completing ACDF on 4/21.CT Head, 05/13/23: No acute intracranial abnormality. No acute fracture or static subluxation of the cervical spine. 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 CT Chest 05/13/23: "Small left and small to moderate right pleural effusions. Mild dependent atelectasis in the lungs. Mild debris or secretions in the distal trachea and right bronchus." Pt intubated from 4/23-4/25.    Assessment / Plan / Recommendation  Clinical Impression  Pt seen for swallow assessment s/p ACDF C3-5 (4/21) and intubation (4/23-4/15). Pt alert, on room air, afebrile, WBC 8.0 and VERY excited for completion of assessment. Granddaughter present for duration of session. Pt currently NPO. Oral motor function grossly intact, voicing clear with mildly reduced intensity, cough strength adequate. Overt swelling noted, with progress note from MD reporting, "swelling much improved." Pt denied pain/discomfort with swallowing. Pt seen with trials of regular solids, puree, and  thin liquids via straw. Total assist provided secondary to bilateral fine motor deficits. No overt or subtle s/sx pharyngeal dysphagia noted.  No change to vocal quality across trials. Vitals stable for duration of trials. Pt demonstrating adequate oral control and complete mastication of solids with extended time. Education shared with pt/family regarding risk for aspiration and recommendations. All reported understanding. Precautions posted in room.       Given recent intubation/procedure (and resultant swelling from both), pt is at increased aspiration. Recommend aspiration precautions, including slow rate, small bites elevated HOB, and alert for PO intake. Total assist and provide rest as needed. Recommend initiation of Dys 2 (chopped) to aid endurance and thin liquids. RN and critical care NP aware of recommendations. SLP will monitor for continued safety with current diet.  SLP Visit Diagnosis: Dysphagia, unspecified (R13.10)    Aspiration Risk  Moderate aspiration risk    Diet Recommendation   Thin;Dysphagia 2 (chopped)  Medication Administration: Whole meds with puree    Other  Recommendations Oral Care Recommendations: Oral care BID;Staff/trained caregiver to provide oral care    Recommendations for follow up therapy are one component of a multi-disciplinary discharge planning process, led by the attending physician.  Recommendations may be updated based on patient status, additional functional criteria and insurance authorization.  Follow up Recommendations Follow physician's recommendations for discharge plan and follow up therapies      Assistance Recommended at Discharge  Assistance/supervision for meals  Functional Status Assessment Patient has had a recent decline in their functional status and demonstrates the ability to make significant improvements in function in a reasonable and predictable amount of time.  Frequency and Duration min 2x/week  2 weeks       Prognosis Prognosis for improved oropharyngeal function: Fair Barriers to Reach Goals:  (endurance)      Swallow Study   General Date of Onset:  05/16/23 HPI: Per MD progress note "Billy Franco with significant PMH of  T2DM, CKD stage III, HTN, HLD, HFrEF with EF 25-30%, refused AICD, ischemic Cardiomyopathy, A-Fib  not on anticoagulation due to gross hematuria, CAD s/p PCI and stent, GERD, RLS and chronic back pain who presented to the ED with chief complaints of bilateral upper and lower extremities weakness and numbness s/p fall. Per family at the bedside, patient fell out of a folding chair on Friday and landed on his left side. Following the fall, he reported generalized weakness, numbness and difficulties grasping objects with his hands. He reported hitting his head without LOC." Neurosurgery completing ACDF on 4/21.CT Head, 05/13/23: No acute intracranial abnormality. No acute fracture or static subluxation of the cervical spine. 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 CT Chest 05/13/23: "Small left and small to moderate right pleural effusions. Mild dependent atelectasis in the lungs. Mild debris or secretions in the distal trachea and right bronchus." Pt intubated from 4/23-4/25. Type of Study: Bedside Swallow Evaluation Previous Swallow Assessment: none in chart Diet Prior to this Study: NPO Temperature Spikes Noted: No Respiratory Status: Room air History of Recent Intubation: Yes Total duration of intubation (days): 3 days Date extubated: 05/15/23 Behavior/Cognition: Alert;Cooperative;Pleasant mood Oral Cavity Assessment: Within Functional Limits Oral Care Completed by SLP: Yes Oral Cavity - Dentition: Adequate natural dentition Vision:  (n/a) Self-Feeding Abilities: Total assist Patient Positioning: Upright in bed Baseline Vocal Quality: Low vocal intensity Volitional Cough: Strong Volitional Swallow: Able to elicit    Oral/Motor/Sensory Function Overall Oral Motor/Sensory Function: Within functional limits (cervical swelling)  Ice Chips Ice chips: Within functional limits   Thin Liquid Thin Liquid: Within  functional limits Presentation: Straw    Nectar Thick Nectar Thick Liquid: Not tested   Honey Thick Honey Thick Liquid: Not tested   Puree Puree: Within functional limits Presentation: Spoon   Solid     Solid: Within functional limits     [place] Lashanta Elbe Clapp, MS, CCC-SLP Speech Language Pathologist Rehab Services; [facility] [place] [phone] (ascom)   [place] J Clapp 05/16/2023,2:52 PM

## 2023-05-16 NOTE — Plan of Care (Signed)
  Problem: Education: Goal: Ability to describe self-care measures that may prevent or decrease complications (Diabetes Survival Skills Education) will improve Outcome: Progressing   Problem: Coping: Goal: Ability to adjust to condition or change in health will improve Outcome: Progressing   Problem: Fluid Volume: Goal: Ability to maintain a balanced intake and output will improve Outcome: Not Progressing   Problem: Metabolic: Goal: Ability to maintain appropriate glucose levels will improve Outcome: Progressing   Problem: Nutritional: Goal: Maintenance of adequate nutrition will improve Outcome: Not Progressing Goal: Progress toward achieving an optimal weight will improve Outcome: Not Progressing   Problem: Skin Integrity: Goal: Risk for impaired skin integrity will decrease Outcome: Progressing   Problem: Tissue Perfusion: Goal: Adequacy of tissue perfusion will improve Outcome: Progressing   Problem: Clinical Measurements: Goal: Diagnostic test results will improve Outcome: Progressing Goal: Respiratory complications will improve Outcome: Progressing Goal: Cardiovascular complication will be avoided Outcome: Progressing

## 2023-05-16 NOTE — Progress Notes (Signed)
 PHARMACY CONSULT NOTE  Pharmacy Consult for Electrolyte Monitoring and Replacement   Recent Labs: Potassium (mmol/L)  Date Value  05/16/2023 3.2 (L)  08/17/2013 4.1   Magnesium  (mg/dL)  Date Value  09/81/1914 2.2  08/15/2013 1.8   Calcium  (mg/dL)  Date Value  78/29/5621 8.1 (L)   Calcium , Total (mg/dL)  Date Value  30/86/5784 8.7   Albumin (g/dL)  Date Value  69/62/9528 2.8 (L)  10/30/2014 4.4   Phosphorus (mg/dL)  Date Value  41/32/4401 2.0 (L)   Sodium (mmol/L)  Date Value  05/16/2023 141  10/30/2014 140  08/17/2013 138   Assessment: 83 y.o. male  with a past medical history of CAD s/p stent, chronic systolic congestive heart failure (EF 25% - 30% - 04/2020), Ischemic Cardiomyopathy (refuses AICD), AF RVR (not on Eliquis  due to hematuria), T2DM, CKD, hypothyroidism who presented to the ED on 05/10/2023 for falling out of a chair.   Goal of Therapy:  Electrolytes WNL  Plan:  --K 3.2, Kcl 10 mEq IV q1h x 4 --Re-check electrolytes in AM  Page Boast 05/16/2023 7:12 AM

## 2023-05-16 NOTE — Progress Notes (Addendum)
   Neurosurgery Progress Note  History: Billy Franco is s/p C3-5 ACDF for cervical stenosis and myelopathy   POD5: Pt doing better, swallowing improved. Swelling much improved. POD4: Pt more awake this morning. Swelling in neck improved POD3: Pt reintubated yesterday for respiratory concerns.  POD2: Pt had some increased pain and vertgo like symptoms yesterday as well as tachycardia and agitation overnight requiring precedex   POD1: Pt reporting continued weakness in his hands but improved leg sensation and proximal upper extremity moving. Swallowing well with minimal pain   Physical Exam: Vitals:   05/16/23 0800 05/16/23 1000  BP: 118/70 (!) 86/72  Pulse: (!) 55 (!) 59  Resp: 11 20  Temp: 98.1 F (36.7 C)   SpO2: 96% 100%   AAOx3 CNI Incision intact. Swelling around neck improved.  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  - mobilize - pain control - DVT prophylaxis - PTOT; patient to wear cervical collar when OOB and ambulating. Ok to remove when in bed and when eating (when able) - Speech therapy consult for swallowing - if aspirating consider NGT for a few days to see if improvement occurs.  - transfer to floor - Normal BP goals - OK to restart plavix  if indicated on 5/5 - dexamethasone  taper.   Jodeen Munch MD Department of Neurosurgery

## 2023-05-16 NOTE — Progress Notes (Signed)
 Patient ID: Billy Franco, male   DOB: 11-13-1940, 83 y.o.   MRN: [id number] [facility] Cardiology    SUBJECTIVE: Patient now extubated had episode of aspiration postop from cervical neck surgery able to move his extremities feel much better has not been able to eat yet until swallowing study complete mild episode of hypotension but feels reasonably well now   Vitals:   05/16/23 0700 05/16/23 0800 05/16/23 1000 05/16/23 1100  BP: 125/60 118/70 (!) 86/72 105/84  Pulse: 62 (!) 55 (!) 59 69  Resp: (!) 23 11 20 19   Temp:  98.1 F (36.7 C)    TempSrc:  Oral    SpO2: 99% 96% 100% 99%  Weight:      Height:         Intake/Output Summary (Last 24 hours) at 05/16/2023 1315 Last data filed at 05/16/2023 0500 Gross per 24 hour  Intake 886.76 ml  Output 950 ml  Net -63.24 ml      PHYSICAL EXAM  General: Well developed, well nourished, in no acute distress.  HEENT:  Normocephalic and atramatic Neck:  No JVD.  Surgical scar anterior Lungs: Clear bilaterally to auscultation and percussion. Heart: HRRR . Normal S1 and S2 without gallops or murmurs.  Abdomen: Bowel sounds are positive, abdomen soft and non-tender  Msk:  Back normal, normal gait. Normal strength and tone for age. Extremities: No clubbing, cyanosis or edema.   Neuro: Alert and oriented X 3. Psych:  Good affect, responds appropriately   LABS: Basic Metabolic Panel: Recent Labs    05/15/23 0402 05/16/23 0456  NA 140 141  K 3.1* 3.2*  CL 102 104  CO2 27 30  GLUCOSE 145* 130*  BUN 45* 43*  CREATININE 1.64* 1.64*  CALCIUM  8.2* 8.1*  MG 2.2 2.0  PHOS 2.0* 3.8   Liver Function Tests: Recent Labs    05/13/23 1605  AST 40  ALT 13  ALKPHOS 49  BILITOT 1.0  PROT 5.8*  ALBUMIN 2.8*   No results for input(s): "LIPASE", "AMYLASE" in the last 72 hours. CBC: Recent Labs    05/15/23 0402 05/16/23 0456  WBC 9.5 8.0  HGB 9.9* 10.2*  HCT 28.7* 30.8*  MCV 94.4 98.1  PLT 214 207   Cardiac Enzymes: No results for  input(s): "CKTOTAL", "CKMB", "CKMBINDEX", "TROPONINI" in the last 72 hours. BNP: Invalid input(s): "POCBNP" D-Dimer: No results for input(s): "DDIMER" in the last 72 hours. Hemoglobin A1C: No results for input(s): "HGBA1C" in the last 72 hours. Fasting Lipid Panel: Recent Labs    05/14/23 0434  TRIG 107   Thyroid  Function Tests: Recent Labs    05/13/23 1812  TSH 2.090  T4TOTAL 5.9   Anemia Panel: No results for input(s): "VITAMINB12", "FOLATE", "FERRITIN", "TIBC", "IRON", "RETICCTPCT" in the last 72 hours.  ECHOCARDIOGRAM COMPLETE Result Date: 05/15/2023    ECHOCARDIOGRAM REPORT   Patient Name:   Billy Franco Date of Exam: 05/15/2023 Medical Rec #:  [id number]      Height:       67.0 in Accession #:    [id number]     Weight:       163.1 lb Date of Birth:  [birth year]      BSA:          1.855 m Patient Age:    82 years       BP:           Not listed in chart/Not listed in  chart mmHg Patient Gender: M              HR:           42 bpm. Exam Location:  [facility] Procedure: 2D Echo, Cardiac Doppler and Color Doppler (Both Spectral and Color            Flow Doppler were utilized during procedure). Indications:     Abnormal ECG R94.31  History:         Patient has prior history of Echocardiogram examinations, most                  recent 12/22/2019.  Sonographer:     Broadus Canes Referring Phys:  [id number] Delanna Fears Diagnosing Phys: Lauralei Clouse D Haston Casebolt MD IMPRESSIONS  1. Left ventricular ejection fraction, by estimation, is 25 to 30%. 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 The left ventricle demonstrates global hypokinesis. The left ventricular internal cavity size was mildly dilated. There is mild asymmetric left ventricular hypertrophy of the septal segment. Left ventricular diastolic parameters are consistent with Grade III diastolic dysfunction (restrictive).  2. Right ventricular systolic function is low normal. The right ventricular size is  mildly enlarged.  3. The mitral valve is normal in structure. Mild to moderate mitral valve regurgitation.  4. The aortic valve is normal in structure. Aortic valve regurgitation is mild. FINDINGS  Left Ventricle: Left ventricular ejection fraction, by estimation, is 25 to 30%. 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 The left ventricle demonstrates global hypokinesis. Strain was performed and the global longitudinal strain is indeterminate. The left ventricular internal cavity size was mildly dilated. There is mild asymmetric left ventricular hypertrophy of the septal segment. Left ventricular diastolic parameters are consistent with Grade III diastolic dysfunction (restrictive). Right Ventricle: The right ventricular size is mildly enlarged. No increase in right ventricular wall thickness. Right ventricular systolic function is low normal. Left Atrium: Left atrial size was normal in size. Right Atrium: Right atrial size was normal in size. Pericardium: There is no evidence of pericardial effusion. Mitral Valve: The mitral valve is normal in structure. Mild to moderate mitral valve regurgitation. Tricuspid Valve: The tricuspid valve is normal in structure. Tricuspid valve regurgitation is trivial. Aortic Valve: The aortic valve is normal in structure. Aortic valve regurgitation is mild. Aortic valve mean gradient measures 2.0 mmHg. Aortic valve peak gradient measures 4.4 mmHg. Aortic valve area, by VTI measures 2.13 cm. Pulmonic Valve: The pulmonic valve was normal in structure. Pulmonic valve regurgitation is not visualized. Aorta: The ascending aorta was not well visualized. IAS/Shunts: No atrial level shunt detected by color flow Doppler. Additional Comments: 3D was performed not requiring image post processing on an independent workstation and was indeterminate.  LEFT VENTRICLE PLAX 2D LVIDd:         4.90 cm      Diastology LVIDs:         4.50 cm      LV e' medial:    5.44 cm/s LV PW:         1.00  cm      LV E/e' medial:  17.2 LV IVS:        1.40 cm      LV e' lateral:   9.68 cm/s LVOT diam:     2.00 cm      LV E/e' lateral: 9.7 LV SV:         51 LV SV Index:   27 LVOT Area:     3.14  cm  LV Volumes (MOD) LV vol d, MOD A2C: 99.9 ml LV vol d, MOD A4C: 106.0 ml LV vol s, MOD A2C: 88.8 ml LV vol s, MOD A4C: 78.3 ml LV SV MOD A2C:     11.1 ml LV SV MOD A4C:     106.0 ml LV SV MOD BP:      15.2 ml RIGHT VENTRICLE RV Basal diam:  4.70 cm RV Mid diam:    3.40 cm LEFT ATRIUM             Index        RIGHT ATRIUM           Index LA diam:        3.30 cm 1.78 cm/m   RA Area:     17.80 cm LA Vol (A2C):   42.3 ml 22.81 ml/m  RA Volume:   45.80 ml  24.70 ml/m LA Vol (A4C):   43.9 ml 23.67 ml/m LA Biplane Vol: 42.7 ml 23.02 ml/m  AORTIC VALVE AV Area (Vmax):    2.36 cm AV Area (Vmean):   1.88 cm AV Area (VTI):     2.13 cm AV Vmax:           105.00 cm/s AV Vmean:          68.900 cm/s AV VTI:            0.238 m AV Peak Grad:      4.4 mmHg AV Mean Grad:      2.0 mmHg LVOT Vmax:         79.00 cm/s LVOT Vmean:        41.200 cm/s LVOT VTI:          0.161 m LVOT/AV VTI ratio: 0.68  AORTA Ao Root diam: 3.20 cm MITRAL VALVE               TRICUSPID VALVE MV Area (PHT): 1.75 cm    TR Peak grad:   16.8 mmHg MV Decel Time: 434 msec    TR Vmax:        205.00 cm/s MV E velocity: 93.80 cm/s MV A velocity: 38.80 cm/s  SHUNTS MV E/A ratio:  2.42        Systemic VTI:  0.16 m                            Systemic Diam: 2.00 cm Isis Costanza D Barbaraann Avans MD Electronically signed by Antonette Batters MD Signature Date/Time: 05/15/2023/1:01:58 PM    Final      Echo moderate to severely depressed left ventricular function EF of around 30% unchanged from previously  TELEMETRY: NSR 75 nsstw:  ASSESSMENT AND PLAN:  Principal Problem:   Spinal cord injury, cervical region [facility] Active Problems:   Central cord syndrome (HCC)   Cervical spinal stenosis   Cervical myelopathy (HCC)   Myelomalacia (HCC)   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)   Spinal cord compression (HCC)    Plan Chronic systolic congestive heart failure EF around 30% continue GDMT Hypotension slightly improved continue blood pressure support and management Postop cervical spine surgery from cervical injury C1-C4 patient improving slowly with purposeful movement in all extremities Status post aspiration pneumonia respiratory failure status post extubation continue antibiotic therapy Recommend swallowing study secondary to his recent aspiration Physical therapy should be helpful once patient clinically stable Consider transfer to progressive care bed   Nashia Remus  Charlett Conroy, MD, 05/16/2023 1:15 PM

## 2023-05-16 NOTE — Progress Notes (Signed)
 Pt transfer from ICU, pt originallly admitted for fall, no surgery required. Pt A&OX4, tele placed NSR, BP (!) 160/96 (BP Location: Right Arm)   Pulse 67   Temp 97.8 F (36.6 C) (Oral)   Resp 18   Ht 5\' 7"  (1.702 m)   Wt 71 kg   SpO2 91%   BMI 24.52 kg/m  Surgical scar on throat. Call bell replaced with touchpad r/t decrease in fine motor skills. Bed in lowest position, 2/4 rails up, wheels locked and bed alarm on. Anders Katz 05/16/23 6:59 PM

## 2023-05-16 NOTE — Progress Notes (Signed)
 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  Per family at the bedside, patient fell out of a folding chair on Friday and landed on his left side. Following the fall, he reported generalized weakness, numbness and difficulties grasping objects with his hands. He reported hitting his head without LOC.   ED Course: Initial vital signs showed HR of  86 beats/minute, BP 103/88 mm Hg, the RR 16 breaths/minute, and the oxygen saturation 100% on RA and a temperature of 97.55F (36.4C).  Pertinent Labs/Diagnostics Findings: Na+/ K+:137/4.0  Glucose:103 BUN/Cr.:37/1.41 WBC: 6.4 K/L Hgb/Hct:10.6/32.5 CXR> CTH> CT Cervical, thoracic, lumbar and MRI brain, cervical as reported below with significant findings of new fracture/disc herniation at C5/6 with moderate stenosis. Findings discussed with on call Neurosurgeon who recommended admission to ICU with frequent neurochecks, pressor to keep MAP>85 and possible surgical intervention. PCCM consulted for admission.    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  Lovenox  to initiate in am per nsgy. I met with patient and family at bedside. He appears stable  and is in good spirits  05/12/23- patient doing well after decomrpession.  Patient reports feelings of falling and moving of med, I repositioned patient and reviewed what I could with family but patient still complains of these symptoms,  reviewed this with neurosurgeon.  He remains on neosynephrine.    05/13/23- JP drain removed,  patient with confusion/aggitation overnight started on precedex . Developing worsening AKI , have reviewed meds and started D5-HcO3 @75cc /hr , may need to discuss with family regarding intubation /GOC 05/14/23- patient unable to get NGT for dobhoff today.  AKI is improved.  For awakening trial today.  05/15/23- patient for SBT today currently on 10/5 PS, mentation is improved following verbal communication, he's off levophed  now.  AKI improved. Patient liberated from MV. 05/16/23-patient improved being optimized for TRH transfer.  Cardiology evaluation today by Dr Beau Bound.  And neurosurgical evaluation by Dr Jeris Montes.  Consults:  Neurosurgery   Significant Diagnostic Tests:  04/20 Chest Xray> IMPRESSION: No active disease. Borderline to mild cardiomegaly.  04/20 CT Cervical Spine> IMPRESSION: 1. Negative for acute fracture or subluxation of the cervical spine. 2. 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  04/20 CT THORACIC SPINE IMPRESSION IMPRESSION: 1. Multilevel degenerative changes without evidence of acute fracture. 2. Trace bilateral pleural effusions with atelectasis. 3. Aortic atherosclerosis with coronary artery calcifications.   04/20 CT LUMBAR SPINE IMPRESSION 1. 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 2. Multilevel degenerative changes in the lumbar spine as described above. 3. Left  renal calculi.  04/20 Noncontrast CT head> IMPRESSION: 1. Mild  motion degradation limits the exam. No definite CT evidence for acute intracranial abnormality. 2. Atrophy and chronic small vessel ischemic changes of the white matter. Interval small chronic appearing right occipital infarct, new compared to 2021 head CT.  4/20 MRI Brain> IMPRESSION: 1. No evidence of acute intracranial abnormality. 2. Remote right PCA territory infarct.  4/20 MRI Cervical Spine> IMPRESSION: 1. 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 Edema extends posteriorly to involve the C6-C7 disc, compatible with traumatic disc injury. Small volume of adjacent prevertebral edema. 2. At C3-C4, severe canal stenosis with cord compression. T2 hyperintensity in the cord at this level is compatible with cord edema and/or myelomalacia. Severe bilateral foraminal stenosis at this level. 3. At C4-C5, moderate to severe canal stenosis and severe bilateral foraminal stenosis. 4. At C6-C7, mild to moderate canal stenosis and moderate bilateral foraminal stenosis.  Micro Data:  4/20: MRSA PCR>>   OBJECTIVE  Blood pressure 126/62, pulse 70, temperature 98.1 F (36.7 C), temperature source Oral, resp. rate 20, height 5\' 7"  (1.702 m), weight 71 kg, SpO2 98%.        Intake/Output Summary (Last 24 hours) at 05/16/2023 1516 Last data filed at 05/16/2023 0500 Gross per 24 hour  Intake 886.76 ml  Output 950 ml  Net -63.24 ml   Filed Weights   05/14/23 0500 05/15/23 0500 05/16/23 0500  Weight: 72.6 kg 74 kg 71 kg     Physical Examination  GENERAL:83  year-old critically ill patient lying in the bed in no acute distress EYES: PEERLA. No scleral icterus. Extraocular muscles intact.  HEENT: Head traumatic,head laceration. normocephalic. Oropharynx and nasopharynx clear.  NECK:  No JVD, supple  LUNGS: Normal breath sounds bilaterally.  No use of accessory muscles of respiration.  CARDIOVASCULAR: S1, S2 normal. No murmurs, rubs, or gallops.  ABDOMEN: Soft,  NTND EXTREMITIES: No swelling or erythema. Grip 4/5 bilaterally. Pulses palpable distally. NEUROLOGIC: The patient is A&O X 4. No focal neurological deficit appreciated. Cranial nerves are intact.  SKIN: No obvious rash, lesion, or ulcer. Warm to touch Labs/imaging that I havepersonally reviewed  (right click and "Reselect all SmartList Selections" daily)     Labs   CBC: Recent Labs  Lab 05/10/23 1621 05/11/23 0300 05/13/23 0338 05/13/23 1605 05/14/23 0434 05/15/23 0402 05/16/23 0456  WBC 6.4   < > 15.2* 11.6* 13.1* 9.5 8.0  NEUTROABS 4.5  --   --   --   --   --   --   HGB 10.6*   < > 11.0* 10.2* 11.1* 9.9* 10.2*  HCT 32.5*   < > 33.5* 32.3* 33.8* 28.7* 30.8*  MCV 98.5   < > 98.5 101.3* 98.3 94.4 98.1  PLT 210   < > 247 212 272 214 207   < > = values in this interval not displayed.    Basic Metabolic Panel: Recent Labs  Lab 05/12/23 0545 05/13/23 0338 05/13/23 1605 05/14/23 0434 05/15/23 0402 05/16/23 0456  NA 133* 134* 131* 139 140 141  K 3.8 3.9 4.0 3.6 3.1* 3.2*  CL 105 107 103 105 102 104  CO2 17* 17* 17* 21* 27 30  GLUCOSE 165* 125* 317* 285* 145* 130*  BUN 35* 44* 44* 47* 45* 43*  CREATININE 1.63* 2.44* 2.60* 2.03* 1.64* 1.64*  CALCIUM  8.5* 8.4* 7.9* 8.6* 8.2* 8.1*  MG 1.7 2.1 2.1 2.3 2.2 2.0  PHOS 4.6 4.6  --  3.5 2.0* 3.8  GFR: Estimated Creatinine Clearance: 32.5 mL/min (A) (by C-G formula based on SCr of 1.64 mg/dL (H)). Recent Labs  Lab 05/13/23 1110 05/13/23 1605 05/13/23 1813 05/14/23 0434 05/15/23 0402 05/16/23 0456  PROCALCITON 0.50  --   --   --   --   --   WBC  --  11.6*  --  13.1* 9.5 8.0  LATICACIDVEN  --  1.0 0.9  --   --   --     Liver Function Tests: Recent Labs  Lab 05/10/23 1621 05/13/23 1605  AST 30 40  ALT 22 13  ALKPHOS 57 49  BILITOT 1.0 1.0  PROT 7.1 5.8*  ALBUMIN 4.0 2.8*   No results for input(s): "LIPASE", "AMYLASE" in the last 168 hours. No results for input(s): "AMMONIA" in the last 168 hours.  ABG     Component Value Date/Time   PHART 7.33 (L) 05/13/2023 1630   PCO2ART 33 05/13/2023 1630   PO2ART 127 (H) 05/13/2023 1630   HCO3 17.4 (L) 05/13/2023 1630   TCO2 26 04/15/2008 2244   ACIDBASEDEF 7.5 (H) 05/13/2023 1630   O2SAT 99.2 05/13/2023 1630     Coagulation Profile: Recent Labs  Lab 05/10/23 2309  INR 1.0    Cardiac Enzymes: Recent Labs  Lab 05/10/23 1621  CKTOTAL 292    HbA1C: Hemoglobin A1C  Date/Time Value Ref Range Status  02/02/2023 08:50 AM 7.8 (A) 4.0 - 5.6 % Final  09/15/2022 08:38 AM 7.3 (A) 4.0 - 5.6 % Final   HbA1c, POC (controlled diabetic range)  Date/Time Value Ref Range Status  01/04/2018 09:16 AM 6.4 0.0 - 7.0 % Final   Hgb A1c MFr Bld  Date/Time Value Ref Range Status  05/06/2021 11:21 AM 6.9 (H) <5.7 % of total Hgb Final    Comment:    For someone without known diabetes, a hemoglobin A1c value of 6.5% or greater indicates that they may have  diabetes and this should be confirmed with a follow-up  test. . For someone with known diabetes, a value <7% indicates  that their diabetes is well controlled and a value  greater than or equal to 7% indicates suboptimal  control. A1c targets should be individualized based on  duration of diabetes, age, comorbid conditions, and  other considerations. . Currently, no consensus exists regarding use of hemoglobin A1c for diagnosis of diabetes for children. Aaron Aas   09/10/2020 09:25 AM 6.4 (H) <5.7 % of total Hgb Final    Comment:    For someone without known diabetes, a hemoglobin  A1c value between 5.7% and 6.4% is consistent with prediabetes and should be confirmed with a  follow-up test. . For someone with known diabetes, a value <7% indicates that their diabetes is well controlled. A1c targets should be individualized based on duration of diabetes, age, comorbid conditions, and other considerations. . This assay result is consistent with an increased risk of diabetes. . Currently, no  consensus exists regarding use of hemoglobin A1c for diagnosis of diabetes for children. .     CBG: Recent Labs  Lab 05/15/23 1948 05/15/23 2315 05/16/23 0309 05/16/23 0742 05/16/23 1116  GLUCAP 126* 106* 124* 85 111*    Review of Systems:    10 point ROS conducted and is negative except as per HPI and subjective findings   Past Medical History  He,  has a past medical history of Arthritis, Atrial fibrillation (HCC), CHF (congestive heart failure) (HCC), CKD (chronic kidney disease), stage III (HCC), Complication of anesthesia, Diabetes  mellitus without complication (HCC), GERD (gastroesophageal reflux disease), Hypertension, Hypothyroidism, and Nephrolithiasis.   Surgical History    Past Surgical History:  Procedure Laterality Date   ANTERIOR CERVICAL DECOMP/DISCECTOMY FUSION N/A 05/11/2023   Procedure: ANTERIOR CERVICAL DECOMPRESSION/DISCECTOMY FUSION 2 LEVELS;  Surgeon: Jodeen Munch, MD;  Location: [facility];  Service: Neurosurgery;  Laterality: N/A;   CATARACT EXTRACTION W/PHACO Right 05/07/2021   Procedure: CATARACT EXTRACTION PHACO AND INTRAOCULAR LENS PLACEMENT (IOC) RIGHT DIABETIC 8.63 00:56.5;  Surgeon: Clair Crews, MD;  Location: [facility];  Service: Ophthalmology;  Laterality: Right;  Diabetic   CATARACT EXTRACTION W/PHACO Left 05/21/2021   Procedure: CATARACT EXTRACTION PHACO AND INTRAOCULAR LENS PLACEMENT (IOC) LEFT DIABETIC 7.27 01:01.5;  Surgeon: Clair Crews, MD;  Location: [facility];  Service: Ophthalmology;  Laterality: Left;  Diabetic   CORONARY STENT INTERVENTION N/A 03/08/2020   Procedure: CORONARY STENT INTERVENTION;  Surgeon: Antonette Batters, MD;  Location: [facility];  Service: Cardiovascular;  Laterality: N/A;   LEFT HEART CATH AND CORONARY ANGIOGRAPHY Left 03/08/2020   Procedure: LEFT HEART CATH AND CORONARY ANGIOGRAPHY;  Surgeon: Antonette Batters, MD;  Location: [facility];  Service:  Cardiovascular;  Laterality: Left;   NASAL FRACTURE SURGERY  1994   URETEROLITHOTOMY Right 2015     Social History   reports that he has never smoked. He has never used smokeless tobacco. He reports that he does not drink alcohol and does not use drugs.   Family History   His family history includes Heart disease in his father.   Allergies Allergies  Allergen Reactions   Codeine Rash and Other (See Comments)    Per pt "hard on my kidneys"     Lovastatin Rash and Other (See Comments)   Tape Rash     Home Medications  Prior to Admission medications   Medication Sig Start Date End Date Taking? Authorizing Provider  acetaminophen  (TYLENOL ) 650 MG CR tablet Take 650 mg by mouth every 8 (eight) hours as needed for pain.   Yes [provider]  atorvastatin  (LIPITOR) 10 MG tablet Take 1 tablet (10 mg total) by mouth daily at 6 PM. 02/02/23  Yes Sowles, Krichna, MD  carvedilol  (COREG ) 3.125 MG tablet Take 1 tablet (3.125 mg total) by mouth 2 (two) times daily with a meal. 12/25/19 05/10/23 Yes Chatterjee, Srobona Tublu, MD  clopidogrel  (PLAVIX ) 75 MG tablet Take 75 mg by mouth daily. 03/04/23  Yes [provider]  furosemide  (LASIX ) 40 MG tablet Take 40 mg by mouth daily. 05/03/23  Yes [provider]  glipiZIDE  2.5 MG TABS Take 1 tablet by mouth daily with breakfast. 02/02/23  Yes Sowles, Krichna, MD  levothyroxine  (SYNTHROID ) 25 MCG tablet TAKE 1 TABLET (25 MCG TOTAL) BY MOUTH DAILY BEFORE BREAKFAST. ON SUNDAYS, TAKE 2 TABLETS BY MOUTH BEFORE BREAKFAST. 04/27/23  Yes Sowles, Krichna, MD  losartan  (COZAAR ) 50 MG tablet Take 50 mg by mouth daily. 05/03/23  Yes [provider]  magnesium  oxide (MAG-OX) 400 MG tablet Take 400 mg by mouth daily.   Yes [provider]  metaxalone (SKELAXIN) 800 MG tablet May take 1/2-1 whole tablet up to 2 times daily as needed for pain/spasm. 04/14/22  Yes [provider]  metFORMIN  (GLUCOPHAGE -XR) 750 MG 24 hr tablet  Take 2 tablets (1,500 mg total) by mouth daily with breakfast. 02/02/23  Yes Sowles, Krichna, MD  pantoprazole  (PROTONIX ) 40 MG tablet Take 1 tablet (40 mg total) by mouth daily. 11/10/22 11/05/23 Yes Marcille Severance,  Brian Campanile, PA-C  rOPINIRole  (REQUIP ) 1 MG tablet Take 1 tablet (1 mg total) by mouth 2 (two) times daily. 02/02/23  Yes Sowles, Krichna, MD  spironolactone (ALDACTONE) 25 MG tablet Take 12.5 mg by mouth daily.   Yes Callwood, Dwayne D, MD  triamcinolone  (KENALOG ) 0.1 % Apply 1 application topically 2 (two) times daily as needed (rash).   Yes [provider]  aluminum hydroxide-magnesium  carbonate (GAVISCON) 95-358 MG/15ML SUSP Take by mouth.    [provider]  blood glucose meter kit and supplies 1 each by Other route as directed. Dispense based on patient and insurance preference. Use up to four times daily as directed. (FOR ICD-10 E10.9, E11.9). 09/09/21   Sowles, Krichna, MD  Cholecalciferol (VITAMIN D -1000 MAX ST) 25 MCG (1000 UT) tablet Take 1 tablet by mouth daily.    [provider]  Cyanocobalamin (B-12) 1000 MCG TBCR Take 1,000 mcg by mouth every other day. 01/02/20   [provider]  dapagliflozin  propanediol (FARXIGA ) 10 MG TABS tablet Take by mouth. 01/10/22   [provider]  docusate sodium  (COLACE) 50 MG capsule Take 100 mg by mouth 2 (two) times daily as needed for mild constipation.    [provider]  fexofenadine-pseudoephedrine (ALLEGRA-D 24) 180-240 MG 24 hr tablet Take 1 tablet by mouth daily as needed (allergies).    [provider]  furosemide  (LASIX ) 20 MG tablet Take 40 mg by mouth daily. Patient not taking: Reported on 05/10/2023 01/02/20   Callwood, Creig Doe D, MD  glucose blood (ONETOUCH ULTRA) test strip USE AS DIRECTED TO CHECK BLOOD GLUCOSE ONCE DAILY. 05/12/22   Sowles, Krichna, MD  Lancets [facility] DELICA PLUS [place]) MISC CHECK BLOOD SUGAR AT NOON 03/02/23   Sowles, Krichna, MD  losartan  (COZAAR ) 25 MG  tablet Take 1 tablet (25 mg total) by mouth at bedtime. Patient not taking: Reported on 05/10/2023 12/25/19   Chatterjee, Srobona Tublu, MD  Nutritional Supplements (THERALITH XR PO) Take by mouth QID. 3.752-45-45-49.5mg . Take 2 in the evening and 2 at bedtime.    [provider]  polyethylene glycol powder (GLYCOLAX /MIRALAX ) 17 GM/SCOOP powder Take 17 g by mouth daily. 11/10/22   Brigitte Canard, PA-C  tamsulosin  (FLOMAX ) 0.4 MG CAPS capsule Take 1 capsule (0.4 mg total) by mouth every evening. 09/08/22   Geraline Knapp, MD        Assessment & Plan:  #Cervical Spine Injury in the setting of traumatic fall MRI Cervical spine shows chronic compression and stenosis at C3/4 and a new fracture/disc herniation Ligamentous injury with edema at C5/6 with moderate stenosis  -PRN Pain management -Neurosurgery consult- s/p surgery   #Chronic HFrEF/Ischemic cardiomyopathy:  EF 40-45%  #Chronic A-Fib not on anticoagulation due to gross hematuria Hx:CAD s/p PCI and stent, HTN, HLD No vascular congestion on CXR yesterday. Appears euvolemic -Hold ? blockers /GDMT -will hold off on acei/arb/arni at this time to augment BP  -Continue Atorvastatin  once able to take po  #AKI on CKD Stage III -Monitor I&O's / urinary output -Follow BMP -Ensure adequate renal perfusion -Avoid nephrotoxic agents as able -Replace electrolytes as indicated   #Diabetes Mellitus -check hemoglobin A1c -CBGs q4 -Sliding scale insulin  -Follow ICU hyper/hypoglycemia protocol -Hold home Metformin  & Glipizide    #Hypothyroidism -Continue home synthroid   #RLS -Continue Requip   Best practice:  Diet:  NPO Pain/Anxiety/Delirium protocol (if indicated): No VAP protocol (if indicated): Not indicated DVT prophylaxis: Contraindicated GI prophylaxis: PPI Glucose control:  SSI Yes Central venous access:  N/A Arterial line:  N/A Foley:  N/A Mobility:  bed rest  PT consulted: N/A Last date of multidisciplinary goals  of care discussion [updated family at the bedside] Code Status:  full code Disposition: ICU   = Goals of Care = Code Status Order: full  Primary Emergency ContactRayman, [place], Home Phone: [phone] Patient and family wishes to pursue full aggressive treatment and intervention options, including CPR and intubation  Critical care provider statement:   Total critical care time: 32 minutes   Performed by: Jaclynn Mast MD   Critical care time was exclusive of separately billable procedures and treating other patients.   Critical care was necessary to treat or prevent imminent or life-threatening deterioration.   Critical care was time spent personally by me on the following activities: development of treatment plan with patient and/or surrogate as well as nursing, discussions with consultants, evaluation of patient's response to treatment, examination of patient, obtaining history from patient or surrogate, ordering and performing treatments and interventions, ordering and review of laboratory studies, ordering and review of radiographic studies, pulse oximetry and re-evaluation of patient's condition.    Laketha Leopard, M.D.  Pulmonary & Critical Care Medicine

## 2023-05-17 DIAGNOSIS — S14109A Unspecified injury at unspecified level of cervical spinal cord, initial encounter: Secondary | ICD-10-CM

## 2023-05-17 LAB — GLUCOSE, CAPILLARY
Glucose-Capillary: 155 mg/dL — ABNORMAL HIGH (ref 70–99)
Glucose-Capillary: 166 mg/dL — ABNORMAL HIGH (ref 70–99)
Glucose-Capillary: 134 mg/dL — ABNORMAL HIGH (ref 70–99)
Glucose-Capillary: 131 mg/dL — ABNORMAL HIGH (ref 70–99)
Glucose-Capillary: 199 mg/dL — ABNORMAL HIGH (ref 70–99)
Glucose-Capillary: 200 mg/dL — ABNORMAL HIGH (ref 70–99)

## 2023-05-17 LAB — CBC
HCT: 31.8 % — ABNORMAL LOW (ref 39.0–52.0)
Hemoglobin: 10.5 g/dL — ABNORMAL LOW (ref 13.0–17.0)
MCH: 32.5 pg (ref 26.0–34.0)
MCHC: 33 g/dL (ref 30.0–36.0)
MCV: 98.5 fL (ref 80.0–100.0)
Platelets: 207 10*3/uL (ref 150–400)
RBC: 3.23 MIL/uL — ABNORMAL LOW (ref 4.22–5.81)
RDW: 12.9 % (ref 11.5–15.5)
WBC: 7.4 10*3/uL (ref 4.0–10.5)
nRBC: 0 % (ref 0.0–0.2)

## 2023-05-17 LAB — CULTURE, RESPIRATORY W GRAM STAIN: Gram Stain: NONE SEEN

## 2023-05-17 LAB — BASIC METABOLIC PANEL WITH GFR
Anion gap: 5 (ref 5–15)
BUN: 43 mg/dL — ABNORMAL HIGH (ref 8–23)
CO2: 30 mmol/L (ref 22–32)
Calcium: 8.3 mg/dL — ABNORMAL LOW (ref 8.9–10.3)
Chloride: 107 mmol/L (ref 98–111)
Creatinine, Ser: 1.45 mg/dL — ABNORMAL HIGH (ref 0.61–1.24)
GFR, Estimated: 48 mL/min — ABNORMAL LOW (ref >60)
Glucose, Bld: 147 mg/dL — ABNORMAL HIGH (ref 70–99)
Potassium: 3.9 mmol/L (ref 3.5–5.1)
Sodium: 142 mmol/L (ref 135–145)

## 2023-05-17 LAB — MAGNESIUM: Magnesium: 2.1 mg/dL (ref 1.7–2.4)

## 2023-05-17 LAB — PHOSPHORUS: Phosphorus: 3.4 mg/dL (ref 2.5–4.6)

## 2023-05-17 MED ORDER — INSULIN ASPART 100 UNIT/ML IJ SOLN
0.0000 [IU] | Freq: Three times a day (TID) | INTRAMUSCULAR | Status: DC
  Administered 2023-05-17: 4 [IU] via SUBCUTANEOUS
  Administered 2023-05-18: 3 [IU] via SUBCUTANEOUS
  Administered 2023-05-18 – 2023-05-19 (×2): 4 [IU] via SUBCUTANEOUS
  Administered 2023-05-20 – 2023-05-21 (×2): 3 [IU] via SUBCUTANEOUS
  Administered 2023-05-22: 4 [IU] via SUBCUTANEOUS
  Filled 2023-05-17 (×7): qty 1

## 2023-05-17 MED ORDER — PANTOPRAZOLE SODIUM 40 MG PO TBEC
40.0000 mg | DELAYED_RELEASE_TABLET | Freq: Every day | ORAL | Status: DC
  Administered 2023-05-17 – 2023-05-21 (×5): 40 mg via ORAL
  Filled 2023-05-17 (×5): qty 1

## 2023-05-17 NOTE — Progress Notes (Signed)
   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 POD5: Pt doing better, swallowing improved. Swelling much improved. POD4: Pt more awake this morning. Swelling in neck improved POD3: Pt reintubated yesterday for respiratory concerns.  POD2: Pt had some increased pain and vertgo like symptoms yesterday as well as tachycardia and agitation overnight requiring precedex   POD1: Pt reporting continued weakness in his hands but improved leg sensation and proximal upper extremity moving. Swallowing well with minimal pain   Physical Exam: Vitals:   05/17/23 0434 05/17/23 0819  BP: (!) 129/59 125/62  Pulse: (!) 55 (!) 54  Resp: 16 18  Temp: 98.1 F (36.7 C) 98.7 F (37.1 C)  SpO2: 100% 100%   AAOx3 CNI Incision intact. Swelling around neck improved.  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  - mobilize - pain control - DVT prophylaxis - PTOT; patient to wear cervical collar when OOB and ambulating. Ok to remove when in bed and when eating (when able) - Speech therapy consult for swallowing - if aspirating consider NGT for a few days to see if improvement occurs.  - consider change to condom catheter - Normal BP goals - OK to restart plavix  if indicated on 5/5 - dexamethasone  taper.   Jodeen Munch MD Department of Neurosurgery

## 2023-05-17 NOTE — Evaluation (Signed)
 Occupational Therapy Evaluation Patient Details Name: Billy Franco MRN: [id number] DOB: Jan 26, 1940 Today's Date: 05/17/2023   History of Present Illness   Pt is an 83 y/o M admitted on 05/10/23 after presenting with c/o BUE & BLE weakness & numbness s/p fall. 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 Pt underwent C3-5 ACDF. Pt was intubated on 05/13/23, extubated on 05/15/23. PMH: DM2, CKD 3, HTN, HLD, HFrEF, refused AICD, ischemic cardiomyopathy, a-fib not on anticoagulation due to gross hematuria, CAD s/p PCI & stent, GERD, RLS, chronic back pain     Clinical Impressions Patient presenting with decreased Ind in self care,balance, functional mobility/transfers, endurance, and safety awareness. Patient reports being Ind at very active at baseline. He lives with wife.  Patient with decreased AROM and strength in B UEs with tenodesis grasp. Pt needing +2 assistance for bed mobility and static sitting balance. Total A of 2 with B knees blocked to stand from EOB. Pt remains very motivated.  Patient will benefit from acute OT to increase overall independence in the areas of ADLs, functional mobility, and safety awareness in order to safely discharge.      If plan is discharge home, recommend the following:   A lot of help with walking and/or transfers;A lot of help with bathing/dressing/bathroom;Assist for transportation;Assistance with cooking/housework;Help with stairs or ramp for entrance     Functional Status Assessment   Patient has had a recent decline in their functional status and demonstrates the ability to make significant improvements in function in a reasonable and predictable amount of time.     Equipment Recommendations   Other (comment) (defer to next venue of care)     Recommendations for Other Services   Rehab consult     Precautions/Restrictions    Precautions Precautions: Fall;Cervical Required Braces or Orthoses: Cervical Brace Cervical Brace: Hard collar     Mobility Bed Mobility Overal bed mobility: Needs Assistance Bed Mobility: Rolling, Sidelying to Sit, Sit to Sidelying Rolling: Max assist, Total assist, +2 for physical assistance, +2 for safety/equipment, Used rails Sidelying to sit: Max assist, Total assist, +2 for physical assistance, +2 for safety/equipment, HOB elevated, Used rails     Sit to sidelying: Max assist, Total assist, +2 for physical assistance, +2 for safety/equipment, HOB elevated, Used rails General bed mobility comments: PT & OT provide cuing re: log rolling, assistance for positioning    Transfers Overall transfer level: Needs assistance Equipment used: 2 person hand held assist Transfers: Sit to/from Stand Sit to Stand: Max assist, Total assist, +2 physical assistance, +2 safety/equipment, From elevated surface           General transfer comment: STS from EOB with +2 assist, PT & OT blocking BLE knees to prevent buckling, some quad/hip activation noted but pt requires cuing for upright trunk & to shift pelvis anteriorly      Balance Overall balance assessment: Needs assistance Sitting-balance support: Feet supported, Bilateral upper extremity supported Sitting balance-Leahy Scale: Poor Sitting balance - Comments: L lateral lean with decreased ability to correct despite cuing Postural control: Posterior lean Standing balance support: Bilateral upper extremity supported, Reliant on assistive device for balance                               ADL either performed or assessed with clinical judgement   ADL Overall ADL's : Needs assistance/impaired  Lower Body Dressing: Total assistance Lower Body Dressing Details (indicate cue type and reason): socks                     Vision Patient Visual Report: No change from baseline               Pertinent Vitals/Pain Pain Assessment Pain Assessment: Faces Faces Pain Scale: Hurts a little bit Pain Location: generalized Pain Descriptors / Indicators: Discomfort Pain Intervention(s): Repositioned, Monitored during session     Extremity/Trunk Assessment Upper Extremity Assessment Upper Extremity Assessment: Defer to OT evaluation;Right hand dominant RUE Deficits / Details: able to touch nose with BUE but limited shoulder flexion RUE Sensation: WNL RUE Coordination: decreased fine motor;decreased gross motor LUE Deficits / Details: AROM: shoulder flexion approx to 1/2 full AROM, elbow flexion/extension approx 3/4 full AROM, wrist 1/4 full AROM, open/close hands approx 1/4 full AROM; PROM grossly WFL; +tenodesis LUE Sensation: WNL   Lower Extremity Assessment Lower Extremity Assessment: RLE deficits/detail;LLE deficits/detail (0/5 - 1/5 BLE dorsiflexion, 2/5 knee extension BLE)   Cervical / Trunk Assessment Cervical / Trunk Assessment: Neck Surgery (reports intermittent numbness & tingling in BUE/BLE)   Communication Communication Communication: No apparent difficulties   Cognition Arousal: Alert Behavior During Therapy: Anxious Cognition: Cognition impaired                               Following commands: Impaired Following commands impaired: Follows one step commands with increased time     Cueing  General Comments   Cueing Techniques: Verbal cues;Gestural cues  therapist assisted pt with donning/doffing cervical collar for OOB mobility           Home Living Family/patient expects to be discharged to:: Private residence Living Arrangements: Spouse/significant other Available Help at Discharge: Available 24 hours/day;Family Type of Home: House Home Access: Stairs to enter [employer] of Steps: 2 Entrance Stairs-Rails: Left Home Layout: One level     Bathroom Shower/Tub: Chief Strategy Officer: Standard     Home  Equipment: Teacher, English as a foreign language (2 wheels)          Prior Functioning/Environment Prior Level of Function : Independent/Modified Independent;Driving;History of Falls (last six months)             Mobility Comments: Ambulatory without AD, works in the yard, driving ADLs Comments: independent    OT Problem List: Decreased strength;Decreased activity tolerance;Decreased knowledge of use of DME or AE;Decreased safety awareness;Impaired balance (sitting and/or standing);Impaired UE functional use   OT Treatment/Interventions: Self-care/ADL training;Therapeutic exercise;Patient/family education;Neuromuscular education;Splinting;Balance training;Energy conservation;Therapeutic activities;DME and/or AE instruction      OT Goals(Current goals can be found in the care plan section)   Acute Rehab OT Goals Patient Stated Goal: go to rehab OT Goal Formulation: With patient/family Time For Goal Achievement: 05/31/23 Potential to Achieve Goals: Fair   OT Frequency:  Min 3X/week    Co-evaluation PT/OT/SLP Co-Evaluation/Treatment: Yes Reason for Co-Treatment: Complexity of the patient's impairments (multi-system involvement);For patient/therapist safety;To address functional/ADL transfers PT goals addressed during session: Mobility/safety with mobility;Balance OT goals addressed during session: ADL's and self-care      AM-PAC OT "6 Clicks" Daily Activity     Outcome Measure Help from another person eating meals?: A Lot Help from another person taking care of personal grooming?: A Lot Help from another person toileting, which includes using toliet, bedpan, or urinal?: Total Help from another person bathing (including washing, rinsing,  drying)?: A Lot Help from another person to put on and taking off regular upper body clothing?: A Lot Help from another person to put on and taking off regular lower body clothing?: A Lot 6 Click Score: 11   End of Session Equipment Utilized During  Treatment: Oxygen Nurse Communication: Mobility status  Activity Tolerance: Patient tolerated treatment well Patient left: in bed;with call bell/phone within reach;with bed alarm set  OT Visit Diagnosis: Other abnormalities of gait and mobility (R26.89);Muscle weakness (generalized) (M62.81);Unsteadiness on feet (R26.81)                Time: 2440-1027 OT Time Calculation (min): 24 min Charges:  OT General Charges $OT Visit: 1 Visit OT Evaluation $OT Eval Moderate Complexity: 1 [address] MS, OTR/L , CBIS ascom [phone]  05/17/23, 3:49 PM

## 2023-05-17 NOTE — Progress Notes (Signed)
 Patient ID: Billy Franco, male   DOB: [birth year], 83 y.o.   MRN: [id number] [facility]    SUBJECTIVE: Patient feeling much better allowed to eat now has ambulated in the room denies any significant chest pain.  No pain no shortness of breath   Vitals:   05/16/23 1850 05/16/23 2011 05/17/23 0434 05/17/23 0819  BP: (!) 160/96 (!) 113/58 (!) 129/59 125/62  Pulse: 67 69 (!) 55 (!) 54  Resp: 18 18 16 18   Temp: 97.8 F (36.6 C) 98 F (36.7 C) 98.1 F (36.7 C) 98.7 F (37.1 C)  TempSrc: Oral Oral    SpO2: 91% 100% 100% 100%  Weight:      Height:         Intake/Output Summary (Last 24 hours) at 05/17/2023 1036 Last data filed at 05/17/2023 0600 Gross per 24 hour  Intake --  Output 600 ml  Net -600 ml      PHYSICAL EXAM  General: Well developed, well nourished, in no acute distress HEENT:  Normocephalic and atramatic Neck:  No JVD.  Healing surgical scar anteriorly Lungs: Clear bilaterally to auscultation and percussion. Heart: HRRR . Normal S1 and S2 without gallops or murmurs.  Abdomen: Bowel sounds are positive, abdomen soft and non-tender  Msk:  Back normal, normal gait. Normal strength and tone for age. Extremities: No clubbing, cyanosis or edema.   Neuro: Alert and oriented X 3. Psych:  Good affect, responds appropriately   LABS: Basic Metabolic Panel: Recent Labs    05/16/23 0456 05/17/23 0549  NA 141 142  K 3.2* 3.9  CL 104 107  CO2 30 30  GLUCOSE 130* 147*  BUN 43* 43*  CREATININE 1.64* 1.45*  CALCIUM  8.1* 8.3*  MG 2.0 2.1  PHOS 3.8 3.4   Liver Function Tests: No results for input(s): "AST", "ALT", "ALKPHOS", "BILITOT", "PROT", "ALBUMIN" in the last 72 hours. No results for input(s): "LIPASE", "AMYLASE" in the last 72 hours. CBC: Recent Labs    05/16/23 0456 05/17/23 0549  WBC 8.0 7.4  HGB 10.2* 10.5*  HCT 30.8* 31.8*  MCV 98.1 98.5  PLT 207 207   Cardiac Enzymes: No results for input(s): "CKTOTAL", "CKMB", "CKMBINDEX", "TROPONINI" in  the last 72 hours. BNP: Invalid input(s): "POCBNP" D-Dimer: No results for input(s): "DDIMER" in the last 72 hours. Hemoglobin A1C: No results for input(s): "HGBA1C" in the last 72 hours. Fasting Lipid Panel: No results for input(s): "CHOL", "HDL", "LDLCALC", "TRIG", "CHOLHDL", "LDLDIRECT" in the last 72 hours. Thyroid  Function Tests: No results for input(s): "TSH", "T4TOTAL", "T3FREE", "THYROIDAB" in the last 72 hours.  Invalid input(s): "FREET3" Anemia Panel: No results for input(s): "VITAMINB12", "FOLATE", "FERRITIN", "TIBC", "IRON", "RETICCTPCT" in the last 72 hours.  No results found.   Echo moderate severely.  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

## 2023-05-17 NOTE — Progress Notes (Signed)
 D/C foley, premafit secured, waiting for patient to void. Billy Franco 05/17/23 5:13 PM

## 2023-05-17 NOTE — Evaluation (Signed)
 Physical Therapy Re-Evaluation Patient Details Name: Billy Franco MRN: [id number] DOB: 1940/09/18 Today's Date: 05/17/2023  History of Present Illness  Pt is an 83 y/o M admitted on 05/10/23 after presenting with c/o BUE & BLE weakness & numbness s/p fall. 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 Pt underwent C3-5 ACDF. Pt was intubated on 05/13/23, extubated on 05/15/23. PMH: DM2, CKD 3, HTN, HLD, HFrEF, refused AICD, ischemic cardiomyopathy, a-fib not on anticoagulation due to gross hematuria, CAD s/p PCI & stent, GERD, RLS, chronic back pain  Clinical Impression  Pt seen for PT re-evaluation with co-tx with OT for pt & therapists' safety. Pt's wife & daughter present for session, pt motivated to participate. Pt reports prior to admission he was independent without AD, enjoys working in the yard. On this date, pt requires max<>total assist for bed mobility & STS EOB. Pt presents with decreased balance while sitting & standing, requiring MAX encouragement to attempt to correct LOB. Pt hopeful to d/c to post acute rehab >3 hours therapy/day. Will continue to follow pt acutely to progress mobility as able.      If plan is discharge home, recommend the following: Two people to help with walking and/or transfers;Two people to help with bathing/dressing/bathroom;Assistance with cooking/housework;Assist for transportation;Help with stairs or ramp for entrance;Assistance with feeding   Can travel by private vehicle        Equipment Recommendations [place] lift;Wheelchair cushion (measurements PT);Wheelchair (measurements PT);Hospital bed  Recommendations for Other Services  Rehab consult    Functional Status Assessment Patient has had a recent decline in their functional status and demonstrates the ability to make significant improvements in function in a reasonable and predictable amount of time.      Precautions / Restrictions Precautions Precautions: Fall;Cervical Required Braces or Orthoses: Cervical Brace Cervical Brace: Hard collar;Other (comment) (can remove in bed or when eating) Restrictions Weight Bearing Restrictions Per Provider Order: No      Mobility  Bed Mobility Overal bed mobility: Needs Assistance Bed Mobility: Rolling, Sidelying to Sit, Sit to Sidelying Rolling: Max assist, Total assist, +2 for physical assistance, +2 for safety/equipment, Used rails Sidelying to sit: Max assist, Total assist, +2 for physical assistance, +2 for safety/equipment, HOB elevated, Used rails     Sit to sidelying: Max assist, Total assist, +2 for physical assistance, +2 for safety/equipment, HOB elevated, Used rails General bed mobility comments: PT & OT provide cuing re: log rolling, assistance for positioning    Transfers Overall transfer level: Needs assistance Equipment used: 2 person hand held assist Transfers: Sit to/from Stand Sit to Stand: Max assist, Total assist, +2 physical assistance, +2 safety/equipment, From elevated surface, Via lift equipment           General transfer comment: STS from EOB with +2 assist, PT & OT blocking BLE knees to prevent buckling, some quad/hip activation noted but pt requires cuing for upright trunk & to shift pelvis anteriorly    Ambulation/Gait                  Stairs            Wheelchair Mobility     Tilt Bed    Modified Rankin (Stroke Patients Only)       Balance Overall balance assessment: Needs assistance Sitting-balance support: Feet supported, Bilateral upper extremity supported Sitting balance-Leahy Scale: Poor Sitting balance - Comments: L lateral lean with decreased ability to  correct despite cuing   Standing balance support: Bilateral upper extremity supported, Reliant on assistive device for balance Standing balance-Leahy Scale: Zero                               Pertinent  Vitals/Pain Pain Assessment Pain Assessment: Faces Faces Pain Scale: Hurts a little bit Pain Location: generalized Pain Descriptors / Indicators: Discomfort Pain Intervention(s): Monitored during session, Repositioned    Home Living Family/patient expects to be discharged to:: Private residence Living Arrangements: Spouse/significant other Available Help at Discharge: Available 24 hours/day;Family Type of Home: House Home Access: Stairs to enter Entrance Stairs-Rails: Left Entrance Stairs-Number of Steps: 2   Home Layout: One level Home Equipment: Teacher, English as a foreign language (2 wheels)      Prior Function               Mobility Comments: Ambulatory without AD, works in the yard, driving ADLs Comments: independent     Extremity/Trunk Assessment   Upper Extremity Assessment Upper Extremity Assessment: Defer to OT evaluation;Right hand dominant RUE Deficits / Details: able to touch nose with BUE but limited shoulder flexion    Lower Extremity Assessment Lower Extremity Assessment: RLE deficits/detail;LLE deficits/detail (0/5 - 1/5 BLE dorsiflexion, 2/5 knee extension BLE)    Cervical / Trunk Assessment Cervical / Trunk Assessment: Neck Surgery (reports intermittent numbness & tingling in BUE/BLE)  Communication   Communication Communication: No apparent difficulties    Cognition Arousal: Alert Behavior During Therapy: Anxious   PT - Cognitive impairments: Awareness                       PT - Cognition Comments: Pt easily will state "I can't" vs trying to attempt. Therapist providing education re: attempting/trying vs just simply stating "I can't". Following commands: Impaired Following commands impaired: Follows one step commands with increased time     Cueing Cueing Techniques: Verbal cues, Gestural cues     General Comments General comments (skin integrity, edema, etc.): therapist assisted pt with donning/doffing cervical collar for OOB mobility     Exercises     Assessment/Plan    PT Assessment Patient needs continued PT services  PT Problem List Decreased strength;Decreased coordination;Decreased cognition;Decreased range of motion;Decreased activity tolerance;Impaired sensation;Decreased knowledge of use of DME;Decreased balance;Decreased safety awareness;Decreased mobility;Decreased knowledge of precautions       PT Treatment Interventions DME instruction;Gait training;Functional mobility training;Therapeutic activities;Therapeutic exercise;Balance training;Patient/family education;Neuromuscular re-education;Stair training;Cognitive remediation;Manual techniques;Wheelchair mobility training;Modalities    PT Goals (Current goals can be found in the Care Plan section)  Acute Rehab PT Goals Patient Stated Goal: to be able to walk PT Goal Formulation: With patient/family Time For Goal Achievement: 05/31/23 Potential to Achieve Goals: Fair    Frequency Min 3X/week     Co-evaluation PT/OT/SLP Co-Evaluation/Treatment: Yes Reason for Co-Treatment: Complexity of the patient's impairments (multi-system involvement);For patient/therapist safety;To address functional/ADL transfers PT goals addressed during session: Mobility/safety with mobility;Balance         AM-PAC PT "6 Clicks" Mobility  Outcome Measure Help needed turning from your back to your side while in a flat bed without using bedrails?: Total Help needed moving from lying on your back to sitting on the side of a flat bed without using bedrails?: Total Help needed moving to and from a bed to a chair (including a wheelchair)?: Total Help needed standing up from a chair using your arms (e.g., wheelchair or bedside chair)?: Total Help needed  to walk in hospital room?: Total Help needed climbing 3-5 steps with a railing? : Total 6 Click Score: 6    End of Session Equipment Utilized During Treatment: Gait belt;Cervical collar Activity Tolerance: Patient tolerated  treatment well Patient left: with bed alarm set;in bed;with call bell/phone within reach;with family/visitor present (bed in chair position) Nurse Communication: Mobility status PT Visit Diagnosis: Unsteadiness on feet (R26.81);Muscle weakness (generalized) (M62.81);Other abnormalities of gait and mobility (R26.89);Difficulty in walking, not elsewhere classified (R26.2);Other symptoms and signs involving the nervous system (R29.898)    Time: 1610-9604 PT Time Calculation (min) (ACUTE ONLY): 24 min   Charges:   PT Evaluation $PT Eval High Complexity: 1 High   PT General Charges $$ ACUTE PT VISIT: 1 Visit         Emaline Handsome, PT, DPT 05/17/23, 3:05 PM   Venetta Gill 05/17/2023, 3:03 PM

## 2023-05-17 NOTE — Progress Notes (Signed)
 PROGRESS NOTE    Griffon Leyendecker  UEA:[id number] DOB: April 26, 1940 DOA: 05/10/2023 PCP: Sowles, Krichna, MD  [id number]/[id number]-AA  LOS: 7 days   Brief hospital course:   Assessment & Plan: 83 y.o male with significant PMH of T2DM, CKD stage III, HTN, HLD, HFrEF with EF 25-30%, refused AICD, ischemic Cardiomyopathy, A-Fib not on anticoagulation due to gross hematuria, CAD s/p PCI and stent, GERD, RLS and chronic back pain who presented to the ED with chief complaints of bilateral upper and lower extremities weakness and numbness s/p fall.   4/20:admit with cervical spine injury following a traumatic fall 05/11/23- patient s/p surgery today.  Lovenox  to initiate in am per nsgy. I met with patient and family at bedside. He appears stable and is in good spirits   05/12/23- patient doing well after decomrpession.  Patient reports feelings of falling and moving of med, I repositioned patient and reviewed what I could with family but patient still complains of these symptoms,  reviewed this with neurosurgeon.  He remains on neosynephrine.     05/13/23- JP drain removed,  patient with confusion/aggitation overnight started on precedex . Developing worsening AKI , have reviewed meds and started D5-HcO3 @75cc /hr , may need to discuss with family regarding intubation /GOC 05/14/23- patient unable to get NGT for dobhoff today.  AKI is improved.  For awakening trial today.  05/15/23- patient for SBT today currently on 10/5 PS, mentation is improved following verbal communication, he's off levophed  now.  AKI improved. Patient liberated from MV. 05/16/23-patient improved being optimized for TRH transfer.  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 Ok to remove when in bed and when eating (when able)  --dexamethasone  taper --CIR   #Chronic HFrEF/Ischemic cardiomyopathy:  EF 40-45%  --stable  #Chronic A-Fib not on anticoagulation  due to gross hematuria --rate controlled   CAD s/p PCI and stent --cont ASA - OK to restart plavix  if indicated on 5/5  --cont statin  #AKI on CKD Stage III Oral hydration now   #Diabetes Mellitus --change to ACHS and SSI   #Hypothyroidism -Continue home synthroid    #RLS -Continue Requip    DVT prophylaxis: Lovenox  SQ Code Status: Full code  Family Communication:  Level of care: Med-Surg Dispo:   The patient is from: home Anticipated d/c is to: CIR Anticipated d/c date is: whenever bed available   Subjective and Interval History:  No pain complaint.  Pt reported getting stronger slowly.   Objective: Vitals:   05/16/23 2011 05/17/23 0434 05/17/23 0819 05/17/23 1620  BP: (!) 113/58 (!) 129/59 125/62 121/74  Pulse: 69 (!) 55 (!) 54 (!) 57  Resp: 18 16 18 17   Temp: 98 F (36.7 C) 98.1 F (36.7 C) 98.7 F (37.1 C) 97.9 F (36.6 C)  TempSrc: Oral     SpO2: 100% 100% 100% 93%  Weight:      Height:        Intake/Output Summary (Last 24 hours) at 05/17/2023 1737 Last data filed at 05/17/2023 1700 Gross per 24 hour  Intake 250 ml  Output 1350 ml  Net -1100 ml   Filed Weights   05/14/23 0500 05/15/23 0500 05/16/23 0500  Weight: 72.6 kg 74 kg 71 kg    Examination:   Constitutional: NAD, AAOx3 HEENT: conjunctivae and lids normal, EOMI CV: No cyanosis.   RESP: normal respiratory effort, on RA Neuro: II - XII grossly intact.   Psych: Normal mood and  affect.  Appropriate judgement and reason   Data Reviewed: I have personally reviewed labs and imaging studies  Time spent: 50 minutes  Garrison Kanner, MD [facility] If 7PM-7AM, please contact night-coverage 05/17/2023, 5:37 PM

## 2023-05-17 NOTE — Progress Notes (Signed)
 PHARMACY CONSULT NOTE  Pharmacy Consult for Electrolyte Monitoring and Replacement   Recent Labs: Potassium (mmol/L)  Date Value  05/17/2023 3.9  08/17/2013 4.1   Magnesium  (mg/dL)  Date Value  32/44/0102 2.1  08/15/2013 1.8   Calcium  (mg/dL)  Date Value  72/53/6644 8.3 (L)   Calcium , Total (mg/dL)  Date Value  03/47/4259 8.7   Albumin (g/dL)  Date Value  56/38/7564 2.8 (L)  10/30/2014 4.4   Phosphorus (mg/dL)  Date Value  33/29/5188 3.4   Sodium (mmol/L)  Date Value  05/17/2023 142  10/30/2014 140  08/17/2013 138   Assessment: 83 y.o. male  with a past medical history of CAD s/p stent, chronic systolic congestive heart failure (EF 25% - 30% - 04/2020), Ischemic Cardiomyopathy (refuses AICD), AF RVR (not on Eliquis  due to hematuria), T2DM, CKD, hypothyroidism who presented to the ED on 05/10/2023 for falling out of a chair.   Goal of Therapy:  Electrolytes WNL  Plan:  --No electrolyte replacement indicated at this time --Sign off on consult given patient care transferred from PCCM to TRH  Aharon Carriere B [facility] 05/17/2023 6:45 AM

## 2023-05-18 DIAGNOSIS — S14109A Unspecified injury at unspecified level of cervical spinal cord, initial encounter: Secondary | ICD-10-CM | POA: Diagnosis not present

## 2023-05-18 LAB — CULTURE, BLOOD (ROUTINE X 2)
Culture: NO GROWTH
Culture: NO GROWTH

## 2023-05-18 LAB — BASIC METABOLIC PANEL WITH GFR
Anion gap: 6 (ref 5–15)
BUN: 36 mg/dL — ABNORMAL HIGH (ref 8–23)
CO2: 29 mmol/L (ref 22–32)
Calcium: 8.2 mg/dL — ABNORMAL LOW (ref 8.9–10.3)
Chloride: 105 mmol/L (ref 98–111)
Creatinine, Ser: 1.18 mg/dL (ref 0.61–1.24)
GFR, Estimated: >60 mL/min (ref >60)
Glucose, Bld: 112 mg/dL — ABNORMAL HIGH (ref 70–99)
Potassium: 3.6 mmol/L (ref 3.5–5.1)
Sodium: 140 mmol/L (ref 135–145)

## 2023-05-18 LAB — GLUCOSE, CAPILLARY
Glucose-Capillary: 120 mg/dL — ABNORMAL HIGH (ref 70–99)
Glucose-Capillary: 132 mg/dL — ABNORMAL HIGH (ref 70–99)
Glucose-Capillary: 152 mg/dL — ABNORMAL HIGH (ref 70–99)
Glucose-Capillary: 144 mg/dL — ABNORMAL HIGH (ref 70–99)

## 2023-05-18 LAB — CBC
HCT: 32.7 % — ABNORMAL LOW (ref 39.0–52.0)
Hemoglobin: 10.8 g/dL — ABNORMAL LOW (ref 13.0–17.0)
MCH: 32.3 pg (ref 26.0–34.0)
MCHC: 33 g/dL (ref 30.0–36.0)
MCV: 97.9 fL (ref 80.0–100.0)
Platelets: 193 10*3/uL (ref 150–400)
RBC: 3.34 MIL/uL — ABNORMAL LOW (ref 4.22–5.81)
RDW: 12.6 % (ref 11.5–15.5)
WBC: 8.5 10*3/uL (ref 4.0–10.5)
nRBC: 0.2 % (ref 0.0–0.2)

## 2023-05-18 MED ORDER — ENSURE ENLIVE PO LIQD
237.0000 mL | Freq: Three times a day (TID) | ORAL | Status: DC
  Administered 2023-05-18 – 2023-05-20 (×4): 237 mL via ORAL

## 2023-05-18 MED ORDER — ADULT MULTIVITAMIN W/MINERALS CH
1.0000 | ORAL_TABLET | Freq: Every day | ORAL | Status: DC
  Administered 2023-05-19 – 2023-05-22 (×4): 1 via ORAL
  Filled 2023-05-18 (×4): qty 1

## 2023-05-18 MED ORDER — SPIRONOLACTONE 12.5 MG HALF TABLET
12.5000 mg | ORAL_TABLET | Freq: Every day | ORAL | Status: DC
Start: 2023-05-18 — End: 2023-05-22
  Administered 2023-05-18 – 2023-05-22 (×4): 12.5 mg via ORAL
  Filled 2023-05-18 (×6): qty 1

## 2023-05-18 NOTE — TOC Progression Note (Signed)
 Transition of Care [facility]) - Progression Note    Patient Details  Name: Billy Franco MRN: [id number] Date of Birth: Mar 30, 1940  Transition of [facility]) CM/SW Contact  Alexandra Ice, RN Phone Number: 05/18/2023, 2:47 PM  Clinical Narrative:    Received message from MD regarding update on CIR progress. TOC sent message to [facility] coordinator regarding patient. She stated she was monitoring patient and feels patient has progressed enough. She will enter in CIR consult.         Expected Discharge Plan and Services                                               Social Determinants of Health (SDOH) Interventions SDOH Screenings   Food Insecurity: No Food Insecurity (05/11/2023)  Housing: Low Risk  (05/11/2023)  Transportation Needs: No Transportation Needs (05/11/2023)  Utilities: Not At Risk (05/11/2023)  Alcohol Screen: Low Risk  (01/24/2019)  Depression (PHQ2-9): Low Risk  (02/02/2023)  Financial Resource Strain: Low Risk  (01/24/2019)  Physical Activity: Sufficiently Active (01/24/2019)  Social Connections: Unknown (05/11/2023)  Stress: No Stress Concern Present (01/24/2019)  Tobacco Use: Low Risk  (03/02/2023)   Received from [facility]    Readmission Risk Interventions     No data to display

## 2023-05-18 NOTE — Care Management Important Message (Signed)
 Important Message  Patient Details  Name: Billy Franco MRN: [id number] Date of Birth: [birth year]   Important Message Given:  Yes - Medicare IM     Raynee Mccasland W, CMA 05/18/2023, 11:30 AM

## 2023-05-18 NOTE — Progress Notes (Signed)
 Speech Language Pathology Treatment: Dysphagia  Patient Details Name: Billy Franco MRN: [id number] DOB: 07-24-1940 Today's Date: 05/18/2023 Time: 1000-1010 SLP Time Calculation (min) (ACUTE ONLY): 10 min  Assessment / Plan / Recommendation Clinical Impression  Pt seen for skilled ST session targeting education for current recommendations and precautions. Pt on room air, afebrile, without recent chest imaging. Spouse/pt deny current issue with diet except for meats- being too "tough" for pt to swallow. Education shared regarding options with puree diet, with pt/spouse reporting plan to select solids based on pt comfort and requested continued chopped solids. Education provided for monitoring for endurance and alertness during intake. Spouse/pt reported understanding. RN denied issue with PO intake. No PO trials completed secondary to nursing needs.   Recommend continued Dys 2 chopped solids and thin liquids with aspiration precautions, slow rate, small bites, elevated HOB, and alert for PO intake. SLP will follow up in ~2-3 days for assessment of diet tolerance/readiness for advanced trials.    HPI HPI: Per MD progress note "83 y.o male with significant PMH of  T2DM, CKD stage III, HTN, HLD, HFrEF with EF 25-30%, refused AICD, ischemic Cardiomyopathy, A-Fib  not on anticoagulation due to gross hematuria, CAD s/p PCI and stent, GERD, RLS and chronic back pain who presented to the ED with chief complaints of bilateral upper and lower extremities weakness and numbness s/p fall. Per family at the bedside, patient fell out of a folding chair on Friday and landed on his left side. Following the fall, he reported generalized weakness, numbness and difficulties grasping objects with his hands. He reported hitting his head without LOC." Neurosurgery completing ACDF on 4/21.CT Head, 05/13/23: No acute intracranial abnormality. No acute fracture or static subluxation of the cervical spine. 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 CT Chest 05/13/23: "Small left and small to moderate right pleural effusions. Mild dependent atelectasis in the lungs. Mild debris or secretions in the distal trachea and right bronchus." Pt intubated from 4/23-4/25.      SLP Plan  Continue with current plan of care      Recommendations for follow up therapy are one component of a multi-disciplinary discharge planning process, led by the attending physician.  Recommendations may be updated based on patient status, additional functional criteria and insurance authorization.    Recommendations  Diet recommendations: Dysphagia 2 (fine chop);Thin liquid Liquids provided via: Straw;Cup Medication Administration: Whole meds with puree Supervision: Full supervision/cueing for compensatory strategies;Staff to assist with self feeding Compensations: Minimize environmental distractions;Slow rate;Small sips/bites Postural Changes and/or Swallow Maneuvers: Seated upright 90 degrees;Upright 30-60 min after meal                  Oral care BID;Staff/trained caregiver to provide oral care   Frequent or constant Supervision/Assistance Dysphagia, unspecified (R13.10)     Continue with current plan of care    [place] Taelyn Broecker Clapp, MS, CCC-SLP Speech Language Pathologist Rehab Services; [facility] [phone] (ascom)   [place] J Clapp  05/18/2023, 11:31 AM

## 2023-05-18 NOTE — Progress Notes (Signed)
 PROGRESS NOTE    Billy Franco  GUY:[id number] DOB: 1940-07-23 DOA: 05/10/2023 PCP: Sowles, Krichna, MD  149A/149A-AA  LOS: 8 days   Brief hospital course:   Assessment & Plan: 83 y.o male with significant PMH of T2DM, CKD stage III, HTN, HLD, HFrEF with EF 25-30%, refused AICD, ischemic Cardiomyopathy, A-Fib not on anticoagulation due to gross hematuria, CAD s/p PCI and stent, GERD, RLS and chronic back pain who presented to the ED with chief complaints of bilateral upper and lower extremities weakness and numbness s/p fall.   4/20:admit with cervical spine injury following a traumatic fall 05/11/23- patient s/p surgery today.  Lovenox  to initiate in am per nsgy. I met with patient and family at bedside. He appears stable and is in good spirits   05/12/23- patient doing well after decomrpession.  Patient reports feelings of falling and moving of med, I repositioned patient and reviewed what I could with family but patient still complains of these symptoms,  reviewed this with neurosurgeon.  He remains on neosynephrine.     05/13/23- JP drain removed,  patient with confusion/aggitation overnight started on precedex . Developing worsening AKI , have reviewed meds and started D5-HcO3 @75cc /hr , may need to discuss with family regarding intubation /GOC 05/14/23- patient unable to get NGT for dobhoff today.  AKI is improved.  For awakening trial today.  05/15/23- patient for SBT today currently on 10/5 PS, mentation is improved following verbal communication, he's off levophed  now.  AKI improved. Patient liberated from MV. 05/16/23-patient improved being optimized for TRH transfer.  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 Ok to remove when in bed and when eating (when able)  --completed dexamethasone  taper --CIR   #Chronic HFrEF/Ischemic cardiomyopathy:  EF 40-45%  --stable  #Chronic A-Fib not on  anticoagulation due to gross hematuria --rate controlled   CAD s/p PCI and stent --cont ASA --start plavix  on 5/5 --cont statin  #AKI on CKD Stage III Oral hydration now   #Diabetes Mellitus --ACHS and SSI   #Hypothyroidism -Continue home synthroid    #RLS -Continue Requip    DVT prophylaxis: Lovenox  SQ Code Status: Full code  Family Communication: family updated at bedside today Level of care: Med-Surg Dispo:   The patient is from: home Anticipated d/c is to: CIR Anticipated d/c date is: whenever bed available   Subjective and Interval History:  Pt reported no problem eating.  Had BM.   Objective: Vitals:   05/18/23 0426 05/18/23 0500 05/18/23 0854 05/18/23 1559  BP: (!) 128/59  127/64 (!) 97/51  Pulse: (!) 52  (!) 51 (!) 58  Resp: 16  18 18   Temp: 98.2 F (36.8 C)  98.3 F (36.8 C) 98 F (36.7 C)  TempSrc:    Oral  SpO2: 100%  100% 100%  Weight:  72 kg    Height:        Intake/Output Summary (Last 24 hours) at 05/18/2023 1857 Last data filed at 05/18/2023 1000 Gross per 24 hour  Intake 200 ml  Output 650 ml  Net -450 ml   Filed Weights   05/15/23 0500 05/16/23 0500 05/18/23 0500  Weight: 74 kg 71 kg 72 kg    Examination:   Constitutional: NAD, AAOx3 HEENT: conjunctivae and lids normal, EOMI CV: No cyanosis.   RESP: normal respiratory effort, on RA Neuro: II - XII grossly intact.   Psych: Normal mood and affect.  Appropriate judgement and reason  Data Reviewed: I have personally reviewed labs and imaging studies  Time spent: 35 minutes  Garrison Kanner, MD [facility] If 7PM-7AM, please contact night-coverage 05/18/2023, 6:57 PM

## 2023-05-18 NOTE — Progress Notes (Signed)
 Occupational Therapy Treatment Patient Details Name: Billy Franco MRN: [id number] DOB: 1940/05/03 Today's Date: 05/18/2023   History of present illness Pt is an 83 y/o M admitted on 05/10/23 after presenting with c/o BUE & BLE weakness & numbness s/p fall. 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 Pt underwent C3-5 ACDF. Pt was intubated on 05/13/23, extubated on 05/15/23. PMH: DM2, CKD 3, HTN, HLD, HFrEF, refused AICD, ischemic cardiomyopathy, a-fib not on anticoagulation due to gross hematuria, CAD s/p PCI & stent, GERD, RLS, chronic back pain   OT comments  Pt seen for OT tx this date. Pt presents with decreased insight into deficits and overall situation, requires continual redirection to task as pt perseverates on topics such as walking. Pt is very motivated, was able to tolerate two therapy sessions today (PT in AM, and OT PM). Requires +2 for bed mobility and static sitting balance. Once seated EOB, pt with increased tone in BLE (R > L), crossing BLE and requiring physical assist to correct supported foot position. Posterior and L lateral lean present; requires MAX A +2 to maintain orientation to midline and correct posture. Pt and spouse inquire about walking today despite heavy +2 assist; OT educates on purpose of static sitting activities and importance of proximal stability. OT will continue to follow to address deficits in cognition, balance, weakness and improve BADL performance to decrease caregiver burden at next LOC. Patient will benefit from intensive inpatient follow-up therapy, >3 hours/day.       If plan is discharge home, recommend the following:  A lot of help with bathing/dressing/bathroom;Assist for transportation;Assistance with cooking/housework;Help with stairs or ramp for entrance;Two people to help with walking and/or transfers   Equipment Recommendations  Other (comment)     Recommendations for Other Services Rehab consult    Precautions / Restrictions Precautions Precautions: Fall;Cervical Precaution/Restrictions Comments: Hard collar (remove in bed and for meals) Required Braces or Orthoses: Cervical Brace Cervical Brace: Hard collar Restrictions Weight Bearing Restrictions Per Provider Order: No       Mobility Bed Mobility Overal bed mobility: Needs Assistance Bed Mobility: Rolling, Sidelying to Sit, Sit to Sidelying Rolling: Max assist, +2 for safety/equipment, Used rails Sidelying to sit: Max assist, +2 for physical assistance, HOB elevated, Used rails     Sit to sidelying: Max assist, +2 for physical assistance, HOB elevated, Used rails General bed mobility comments: vcs for logrolling technique    Transfers Overall transfer level: Needs assistance                 General transfer comment: NT     Balance Overall balance assessment: Needs assistance Sitting-balance support: Feet supported, Bilateral upper extremity supported Sitting balance-Leahy Scale: Poor Sitting balance - Comments: maxA Postural control: Posterior lean, Left lateral lean                                 ADL either performed or assessed with clinical judgement   ADL Overall ADL's : Needs assistance/impaired                                       General ADL Comments: Session focused on static sitting balance    Extremity/Trunk Assessment  Vision       Perception     Praxis     Communication Communication Communication: No apparent difficulties   Cognition Arousal: Alert Behavior During Therapy: WFL for tasks assessed/performed Cognition: Cognition impaired   Orientation impairments: Situation Awareness: Online awareness impaired Memory impairment (select all impairments): Short-term memory Attention impairment (select first level of impairment): Sustained attention Executive functioning  impairment (select all impairments): Reasoning, Problem solving OT - Cognition Comments: decreased insight into situation and overall deficits, pt perseverates on wanting bar to "pull up on" and "to walk today" despite frequent redirection and inablity to sit unsupported EOB.                 Following commands: Impaired Following commands impaired: Follows one step commands with increased time      Cueing   Cueing Techniques: Verbal cues, Gestural cues, Visual cues, Tactile cues  Exercises Other Exercises Other Exercises: BUE elevated on pillows, edemea noted    Shoulder Instructions       General Comments totalA to don and doff cervical collar. RN notified of UE edema    Pertinent Vitals/ Pain       Pain Assessment Pain Assessment: No/denies pain Pain Score: 0-No pain  Home Living                                          Prior Functioning/Environment              Frequency  Min 3X/week        Progress Toward Goals  OT Goals(current goals can now be found in the care plan section)  Progress towards OT goals: Progressing toward goals  Acute Rehab OT Goals OT Goal Formulation: With patient/family Time For Goal Achievement: 05/31/23 Potential to Achieve Goals: Fair ADL Goals Pt Will Perform Eating: with set-up;sitting;with adaptive utensils Pt Will Perform Grooming: sitting;with supervision Pt Will Perform Lower Body Dressing: sit to/from stand;sitting/lateral leans;with min assist Pt Will Transfer to Toilet: with contact guard assist;stand pivot transfer Pt Will Perform Toileting - Clothing Manipulation and hygiene: with min assist;sitting/lateral leans;sit to/from stand;with adaptive equipment  Plan      Co-evaluation                 AM-PAC OT "6 Clicks" Daily Activity     Outcome Measure   Help from another person eating meals?: A Lot Help from another person taking care of personal grooming?: A Lot Help from another  person toileting, which includes using toliet, bedpan, or urinal?: Total Help from another person bathing (including washing, rinsing, drying)?: A Lot Help from another person to put on and taking off regular upper body clothing?: A Lot Help from another person to put on and taking off regular lower body clothing?: A Lot 6 Click Score: 11    End of Session Equipment Utilized During Treatment: Cervical collar  OT Visit Diagnosis: Other abnormalities of gait and mobility (R26.89);Muscle weakness (generalized) (M62.81);Unsteadiness on feet (R26.81)   Activity Tolerance Patient tolerated treatment well   Patient Left in bed;with call bell/phone within reach;with bed alarm set;with family/visitor present   Nurse Communication Mobility status        Time: 4098-1191 OT Time Calculation (min): 25 min  Charges: OT General Charges $OT Visit: 1 Visit OT Treatments $Self Care/Home Management : 8-22 mins  Vici Novick L. Deyana Wnuk, OTR/L  05/18/23, 5:09 PM

## 2023-05-18 NOTE — Progress Notes (Signed)
 Nutrition Follow Up Note   DOCUMENTATION CODES:   Not applicable  INTERVENTION:   Ensure Enlive po TID, each supplement provides 350 kcal and 20 grams of protein.  MVI po daily   Pt at high refeed risk; recommend monitor potassium, magnesium  and phosphorus labs daily until stable  Daily weights   NUTRITION DIAGNOSIS:   Inadequate oral intake related to inability to eat (pt sedated and ventilated) as evidenced by NPO status. -progressing   GOAL:   Patient will meet greater than or equal to 90% of their needs -not met   MONITOR:   PO intake, Supplement acceptance, Labs, Weight trends, Skin, I & O's   ASSESSMENT:   83 y/o male with h/o GERD, chronic back pain, DM, HTN, Afib, RLS, CAD s/p PCI and stent, CKD III, CHF with EF 25-30%, refused AICD, ischemic cardiomyopathy and gout who is admitted with cervical stenosis and myelopathy after fall s/p C3-5 ACDF 4/21 complicated by acute metabolic encephalopathy with acute hypoxic respiratory failure due to presumed aspiration requiring intubation and mechanical ventilation for airway protection.  Pt extubated 4/25. Enteral access was not able to be gained prior to extubation. Pt seen by SLP on 4/26 and initiated pt on a dysphagia 2 diet. Pt documented to have eaten 80% of his breakfast this morning. RD will add supplements and MVI to help pt meet his estimated needs. Pt is at high refeed risk. Per chart, pt is up ~7lbs from admission.   Medications reviewed and include: aspirin , lovenox , insulin , synthroid , protonix , senokot, aldactone, zosyn    Labs reviewed: K 3.6 wnl, BUN 36(H) P 3.4 wnl, Mg 2.1 wnl- 4/27 Hgb 10.8(L), Hct 32.7(L) Cbgs- 152, 132, 120 x 24 hrs   Diet Order:   Diet Order             DIET DYS 2 Room service appropriate? Yes; Fluid consistency: Thin  Diet effective now                  EDUCATION NEEDS:   No education needs have been identified at this time  Skin:  Skin Assessment: Reviewed RN Assessment  (incision neck, skin tears)  Last BM:  4/28- type 6  Height:   Ht Readings from Last 1 Encounters:  05/10/23 5\' 7"  (1.702 m)    Weight:   Wt Readings from Last 1 Encounters:  05/18/23 72 kg    Ideal Body Weight:  67.2 kg  BMI:  Body mass index is 24.86 kg/m.  Estimated Nutritional Needs:   Kcal:  1700-2000kcal/day  Protein:  85-100g/day  Fluid:  1.7-2.0L/day  Torrance Freestone MS, RD, LDN If unable to be reached, please send secure chat to "RD inpatient" available from 8:00a-4:00p daily

## 2023-05-18 NOTE — Progress Notes (Signed)
 Physical Therapy Treatment Patient Details Name: Billy Franco MRN: [id number] DOB: 04-Feb-1940 Today's Date: 05/18/2023   History of Present Illness Pt is an 83 y/o M admitted on 05/10/23 after presenting with c/o BUE & BLE weakness & numbness s/p fall. 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 Pt underwent C3-5 ACDF. Pt was intubated on 05/13/23, extubated on 05/15/23. PMH: DM2, CKD 3, HTN, HLD, HFrEF, refused AICD, ischemic cardiomyopathy, a-fib not on anticoagulation due to gross hematuria, CAD s/p PCI & stent, GERD, RLS, chronic back pain    PT Comments  Pt resting in bed upon PT arrival; pt's wife present most of session.  During session pt was 2 assist with bed mobility (utilized logrolling technique); progressed from max assist to CGA for sitting balance (posterior L lean initially but improved with cueing); and unable to stand from elevated bed height with 2 assist.  Pt appearing very motivated to participate in therapy and improve his functional status.  Will continue to focus on strengthening, balance, and progressive functional mobility during hospitalization    If plan is discharge home, recommend the following: Two people to help with walking and/or transfers;Two people to help with bathing/dressing/bathroom;Assistance with cooking/housework;Assist for transportation;Help with stairs or ramp for entrance;Assistance with feeding   Can travel by private vehicle      No  Equipment Recommendations  Hoyer lift;Wheelchair cushion (measurements PT);Wheelchair (measurements PT);Hospital bed    Recommendations for Other Services Rehab consult     Precautions / Restrictions Precautions Precautions: Fall;Cervical Precaution/Restrictions Comments: Hard collar (remove in bed and for meals) Required Braces or Orthoses: Cervical Brace Cervical Brace: Hard collar Restrictions Weight Bearing  Restrictions Per Provider Order: No     Mobility  Bed Mobility Overal bed mobility: Needs Assistance Bed Mobility: Rolling, Sidelying to Sit, Sit to Sidelying Rolling: Max assist, +2 for safety/equipment, Used rails Sidelying to sit: Max assist, +2 for physical assistance, HOB elevated, Used rails     Sit to sidelying: Max assist, +2 for physical assistance, HOB elevated, Used rails General bed mobility comments: vc's for logrolling technique and overall bed mobility technique    Transfers Overall transfer level: Needs assistance Equipment used: 2 person hand held assist Transfers: Sit to/from Stand Sit to Stand: Max assist, Total assist, +2 physical assistance, From elevated surface           General transfer comment: x3 trials standing from elevated bed height; B knees and feet blocked to prevent knees buckling/feet sliding; unable to clear pt's bottom from bed with 2 assist    Ambulation/Gait                   Stairs             Wheelchair Mobility     Tilt Bed    Modified Rankin (Stroke Patients Only)       Balance Overall balance assessment: Needs assistance Sitting-balance support: Feet supported, Bilateral upper extremity supported Sitting balance-Leahy Scale: Poor Sitting balance - Comments: posterior L lean (improved with cueing and repositioning); improved from max assist to CGA Postural control: Posterior lean, Left lateral lean                                  Communication Communication Communication: No apparent difficulties  Cognition Arousal: Alert Behavior During Therapy: [facility] for tasks assessed/performed  PT - Cognitive impairments: Awareness, Initiation, Sequencing, Problem solving, Safety/Judgement                       PT - Cognition Comments: Impaired processing noted with activities Following commands: Impaired Following commands impaired: Follows one step commands with increased time     Cueing Cueing Techniques: Verbal cues, Gestural cues, Visual cues, Tactile cues  Exercises      General Comments General comments (skin integrity, edema, etc.): total assist for donning/doffing cervical collar for OOB mobility.  Nursing cleared pt for participation in physical therapy.  Pt agreeable to PT session.      Pertinent Vitals/Pain Pain Assessment Pain Assessment: No/denies pain Pain Intervention(s): Limited activity within patient's tolerance, Monitored during session HR in upper 50's bpm and SpO2 sats 99% or greater on room air during sessions activities.    Home Living                          Prior Function            PT Goals (current goals can now be found in the care plan section) Acute Rehab PT Goals Patient Stated Goal: to be able to walk PT Goal Formulation: With patient/family Time For Goal Achievement: 05/31/23 Potential to Achieve Goals: Fair Progress towards PT goals: Progressing toward goals    Frequency    Min 3X/week      PT Plan      Co-evaluation              AM-PAC PT "6 Clicks" Mobility   Outcome Measure  Help needed turning from your back to your side while in a flat bed without using bedrails?: A Lot Help needed moving from lying on your back to sitting on the side of a flat bed without using bedrails?: Total Help needed moving to and from a bed to a chair (including a wheelchair)?: Total Help needed standing up from a chair using your arms (e.g., wheelchair or bedside chair)?: Total Help needed to walk in hospital room?: Total Help needed climbing 3-5 steps with a railing? : Total 6 Click Score: 7    End of Session Equipment Utilized During Treatment: Gait belt;Cervical collar Activity Tolerance: Patient tolerated treatment well Patient left: in bed;with call bell/phone within reach;with bed alarm set;with family/visitor present;Other (comment) (B LE's elevated via pillow with heels floating; B UE's supported via  pillows) Nurse Communication: Mobility status;Precautions PT Visit Diagnosis: Unsteadiness on feet (R26.81);Muscle weakness (generalized) (M62.81);Other abnormalities of gait and mobility (R26.89);Difficulty in walking, not elsewhere classified (R26.2);Other symptoms and signs involving the nervous system (R29.898)     Time: 1610-9604 PT Time Calculation (min) (ACUTE ONLY): 34 min  Charges:    $Therapeutic Activity: 23-37 mins PT General Charges $$ ACUTE PT VISIT: 1 Visit                    Amador Junes, PT 05/18/23, 1:09 PM

## 2023-05-18 NOTE — Progress Notes (Signed)
   Neurosurgery Progress Note  History: Billy Franco is s/p C3-5 ACDF for cervical stenosis and myelopathy   POD7: NAEO POD6: Neck swelling and swallowing improved. POD5: Pt doing better, swallowing improved. Swelling much improved. POD4: Pt more awake this morning. Swelling in neck improved POD3: Pt reintubated yesterday for respiratory concerns.  POD2: Pt had some increased pain and vertgo like symptoms yesterday as well as tachycardia and agitation overnight requiring precedex   POD1: Pt reporting continued weakness in his hands but improved leg sensation and proximal upper extremity moving. Swallowing well with minimal pain   Physical Exam: Vitals:   05/17/23 2038 05/18/23 0426  BP: 129/64 (!) 128/59  Pulse: (!) 57 (!) 52  Resp: 18 16  Temp: 97.6 F (36.4 C) 98.2 F (36.8 C)  SpO2: 100% 100%   Drowsy but arouses to voice CNI Incision intact. 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  - mobilize - pain control - DVT prophylaxis - PTOT; patient to wear cervical collar when OOB and ambulating. Ok to remove when in bed and when eating (when able) - Speech therapy consult for swallowing - Recommending dysphagia 2 diet - Normal BP goals - OK to restart plavix  if indicated on 5/5  Anastacio Karvonen PA-C Department of Neurosurgery

## 2023-05-18 NOTE — Progress Notes (Signed)

## 2023-05-18 NOTE — Progress Notes (Signed)
 [facility] CLINIC CARDIOLOGY PROGRESS NOTE       Patient ID: Billy Franco MRN: [id number] DOB/AGE: [birth year] 83 y.o.  Admit date: 05/10/2023 Referring Physician Dr. Duayne Gey Primary Physician Ava Lei, Arvis Laura, MD Primary Cardiologist Dr. Beau Bound Reason for Consultation Cardiac Clearance   HPI: Billy Franco is a 83 y.o. male  with a past medical history of CAD s/p stent, chronic systolic congestive heart failure (EF 25% - 30% - 04/2020), Ischemic Cardiomyopathy (refuses AICD), AF RVR (not on Eliquis  due to hematuria), T2DM, CKD, hypothyroidism who presented to the ED on 05/10/2023 for falling out of a chair on Friday. Patient is requiring surgical intervention and needs cardiac clearance. Cardiology was consulted for further evaluation.   Interval history: -Patient seen and examined this AM, sitting upright in bed with wife at bedside. -Reports feeling better overall, appears much improved.  -On room air. HR remains borderline low but he is without dizziness/lightheadedness.  Review of systems complete and found to be negative unless listed above    Past Medical History:  Diagnosis Date   Arthritis    Lower back, hips   Atrial fibrillation (HCC)    CHF (congestive heart failure) (HCC)    CKD (chronic kidney disease), stage III (HCC)    Complication of anesthesia    has "crooked airway"  Diff breathing after extubated (after kidney stone procedure 07/2013)   Diabetes mellitus without complication (HCC)    GERD (gastroesophageal reflux disease)    Hypertension    Hypothyroidism    Nephrolithiasis     Past Surgical History:  Procedure Laterality Date   ANTERIOR CERVICAL DECOMP/DISCECTOMY FUSION N/A 05/11/2023   Procedure: ANTERIOR CERVICAL DECOMPRESSION/DISCECTOMY FUSION 2 LEVELS;  Surgeon: Jodeen Munch, MD;  Location: [facility] ORS;  Service: Neurosurgery;  Laterality: N/A;   CATARACT EXTRACTION W/PHACO Right 05/07/2021   Procedure: CATARACT EXTRACTION PHACO AND INTRAOCULAR  LENS PLACEMENT (IOC) RIGHT DIABETIC 8.63 00:56.5;  Surgeon: Clair Crews, MD;  Location: [facility];  Service: Ophthalmology;  Laterality: Right;  Diabetic   CATARACT EXTRACTION W/PHACO Left 05/21/2021   Procedure: CATARACT EXTRACTION PHACO AND INTRAOCULAR LENS PLACEMENT (IOC) LEFT DIABETIC 7.27 01:01.5;  Surgeon: Clair Crews, MD;  Location: [facility];  Service: Ophthalmology;  Laterality: Left;  Diabetic   CORONARY STENT INTERVENTION N/A 03/08/2020   Procedure: CORONARY STENT INTERVENTION;  Surgeon: Antonette Batters, MD;  Location: [facility] INVASIVE CV LAB;  Service: Cardiovascular;  Laterality: N/A;   LEFT HEART CATH AND CORONARY ANGIOGRAPHY Left 03/08/2020   Procedure: LEFT HEART CATH AND CORONARY ANGIOGRAPHY;  Surgeon: Antonette Batters, MD;  Location: [facility] INVASIVE CV LAB;  Service: Cardiovascular;  Laterality: Left;   NASAL FRACTURE SURGERY  1994   URETEROLITHOTOMY Right 2015    Medications Prior to Admission  Medication Sig Dispense Refill Last Dose/Taking   acetaminophen  (TYLENOL ) 650 MG CR tablet Take 650 mg by mouth every 8 (eight) hours as needed for pain.   05/10/2023 Morning   aluminum hydroxide-magnesium  carbonate (GAVISCON) 95-358 MG/15ML SUSP Take by mouth.   Taking   atorvastatin  (LIPITOR) 10 MG tablet Take 1 tablet (10 mg total) by mouth daily at 6 PM. 90 tablet 1 05/09/2023 Evening   carvedilol  (COREG ) 3.125 MG tablet Take 1 tablet (3.125 mg total) by mouth 2 (two) times daily with a meal. 60 tablet 0 05/10/2023 Morning   Cholecalciferol (VITAMIN D -1000 MAX ST) 25 MCG (1000 UT) tablet Take 1 tablet by mouth daily.   Taking   clopidogrel  (PLAVIX ) 75 MG tablet Take 75 mg by  mouth daily.   05/10/2023 Morning   Cyanocobalamin (B-12) 1000 MCG TBCR Take 1,000 mcg by mouth every other day.   05/10/2023 Morning   docusate sodium  (COLACE) 50 MG capsule Take 100 mg by mouth 2 (two) times daily as needed for mild constipation.   Unknown   fexofenadine-pseudoephedrine  (ALLEGRA-D 24) 180-240 MG 24 hr tablet Take 1 tablet by mouth daily as needed (allergies).   Unknown   furosemide  (LASIX ) 40 MG tablet Take 40 mg by mouth daily.   05/10/2023 Morning   glipiZIDE  2.5 MG TABS Take 1 tablet by mouth daily with breakfast. 90 tablet 1 Taking   levothyroxine  (SYNTHROID ) 25 MCG tablet TAKE 1 TABLET (25 MCG TOTAL) BY MOUTH DAILY BEFORE BREAKFAST. ON SUNDAYS, TAKE 2 TABLETS BY MOUTH BEFORE BREAKFAST. 102 tablet 1 05/10/2023 Morning   losartan  (COZAAR ) 25 MG tablet Take 1 tablet (25 mg total) by mouth at bedtime. 30 tablet 0 05/09/2023 Bedtime   magnesium  oxide (MAG-OX) 400 MG tablet Take 400 mg by mouth daily.   05/09/2023 Evening   metaxalone (SKELAXIN) 800 MG tablet Take 400 mg by mouth 2 (two) times daily.   05/09/2023 Bedtime   metFORMIN  (GLUCOPHAGE -XR) 750 MG 24 hr tablet Take 2 tablets (1,500 mg total) by mouth daily with breakfast. 180 tablet 1 05/10/2023 Morning   Nutritional Supplements (THERALITH XR PO) Take by mouth QID. 3.752-45-45-49.5mg . Take 2 in the evening and 2 at bedtime.   05/10/2023 Morning   pantoprazole  (PROTONIX ) 40 MG tablet Take 1 tablet (40 mg total) by mouth daily. 90 tablet 3 05/09/2023 Evening   polyethylene glycol powder (GLYCOLAX /MIRALAX ) 17 GM/SCOOP powder Take 17 g by mouth daily.   Unknown   rOPINIRole  (REQUIP ) 1 MG tablet Take 1 tablet (1 mg total) by mouth 2 (two) times daily. 180 tablet 1 05/09/2023 Bedtime   spironolactone (ALDACTONE) 25 MG tablet Take 12.5 mg by mouth daily.   05/10/2023   tamsulosin  (FLOMAX ) 0.4 MG CAPS capsule Take 1 capsule (0.4 mg total) by mouth every evening. 90 capsule 2 05/09/2023 Evening   triamcinolone  (KENALOG ) 0.1 % Apply 1 application topically 2 (two) times daily as needed (rash).   Unknown   blood glucose meter kit and supplies 1 each by Other route as directed. Dispense based on patient and insurance preference. Use up to four times daily as directed. (FOR ICD-10 E10.9, E11.9). 1 each 1    glucose blood (ONETOUCH  ULTRA) test strip USE AS DIRECTED TO CHECK BLOOD GLUCOSE ONCE DAILY. 100 strip 3    Lancets (ONETOUCH DELICA PLUS LANCET33G) MISC CHECK BLOOD SUGAR AT NOON 100 each 1    Social History   Socioeconomic History   Marital status: Married    Spouse name: Sport and exercise psychologist    Number of children: 3   Years of education: Not on file   Highest education level: Not on file  Occupational History   Occupation: retired     Comment: IT trainer at a [employer]   Tobacco Use   Smoking status: Never   Smokeless tobacco: Never  Vaping Use   Vaping status: Never Used  Substance and Sexual Activity   Alcohol use: No    Alcohol/week: 0.0 standard drinks of alcohol   Drug use: No   Sexual activity: Not Currently  Other Topics Concern   Not on file  Social History Narrative   Married, had 3 children, one died at a MVA at age 69   [employer] of a church    Social Drivers  of Health   Financial Resource Strain: Low Risk  (01/24/2019)   Overall Financial Resource Strain (CARDIA)    Difficulty of Paying Living Expenses: Not hard at all  Food Insecurity: No Food Insecurity (05/11/2023)   Hunger Vital Sign    Worried About Running Out of Food in the Last Year: Never true    Ran Out of Food in the Last Year: Never true  Transportation Needs: No Transportation Needs (05/11/2023)   PRAPARE - Administrator, Civil Service (Medical): No    Lack of Transportation (Non-Medical): No  Physical Activity: Sufficiently Active (01/24/2019)   Exercise Vital Sign    Days of Exercise per Week: 5 days    Minutes of Exercise per Session: 60 min  Stress: No Stress Concern Present (01/24/2019)   [employer] of Occupational Health - Occupational Stress Questionnaire    Feeling of Stress : Not at all  Social Connections: Unknown (05/11/2023)   Social Connection and Isolation Panel [NHANES]    Frequency of Communication with Friends and Family: More than three times a week    Frequency of Social Gatherings with Friends  and Family: More than three times a week    Attends Religious Services: More than 4 times per year    Active Member of [employer] or Organizations: Not on file    Attends Banker Meetings: Not on file    Marital Status: Married  Intimate Partner Violence: Not At Risk (05/11/2023)   Humiliation, Afraid, Rape, and Kick questionnaire    Fear of Current or Ex-Partner: No    Emotionally Abused: No    Physically Abused: No    Sexually Abused: No    Family History  Problem Relation Age of Onset   Heart disease Father      Vitals:   05/17/23 2038 05/18/23 0426 05/18/23 0500 05/18/23 0854  BP: 129/64 (!) 128/59  127/64  Pulse: (!) 57 (!) 52  (!) 51  Resp: 18 16  18   Temp: 97.6 F (36.4 C) 98.2 F (36.8 C)  98.3 F (36.8 C)  TempSrc: Oral     SpO2: 100% 100%  100%  Weight:   72 kg   Height:        PHYSICAL EXAM General: Ill appearing elderly male, intubated and sedated. HEENT: Normocephalic and atraumatic. Neck: No JVD.   Lungs: Mechanical breath sounds. Heart: HRRR, slow rate. Normal S1 and S2 without gallops or murmurs.  Abdomen: Non-distended appearing.  Msk: Normal strength and tone for age. Extremities: Warm and well perfused. No clubbing, cyanosis. No edema.    Labs: Basic Metabolic Panel: Recent Labs    05/16/23 0456 05/17/23 0549 05/18/23 0537  NA 141 142 140  K 3.2* 3.9 3.6  CL 104 107 105  CO2 30 30 29   GLUCOSE 130* 147* 112*  BUN 43* 43* 36*  CREATININE 1.64* 1.45* 1.18  CALCIUM  8.1* 8.3* 8.2*  MG 2.0 2.1  --   PHOS 3.8 3.4  --    Liver Function Tests: No results for input(s): "AST", "ALT", "ALKPHOS", "BILITOT", "PROT", "ALBUMIN" in the last 72 hours.  No results for input(s): "LIPASE", "AMYLASE" in the last 72 hours. CBC: Recent Labs    05/17/23 0549 05/18/23 0537  WBC 7.4 8.5  HGB 10.5* 10.8*  HCT 31.8* 32.7*  MCV 98.5 97.9  PLT 207 193   Cardiac Enzymes: No results for input(s): "CKTOTAL", "CKMB", "CKMBINDEX", "TROPONINIHS" in  the last 72 hours.  BNP: No results for input(s): "  BNP" in the last 72 hours. D-Dimer: No results for input(s): "DDIMER" in the last 72 hours. Hemoglobin A1C: No results for input(s): "HGBA1C" in the last 72 hours. Fasting Lipid Panel: No results for input(s): "CHOL", "HDL", "LDLCALC", "TRIG", "CHOLHDL", "LDLDIRECT" in the last 72 hours.  Thyroid  Function Tests: No results for input(s): "TSH", "T4TOTAL", "T3FREE", "THYROIDAB" in the last 72 hours.  Invalid input(s): "FREET3"  Anemia Panel: No results for input(s): "VITAMINB12", "FOLATE", "FERRITIN", "TIBC", "IRON", "RETICCTPCT" in the last 72 hours.   Radiology: ECHOCARDIOGRAM COMPLETE Result Date: 05/15/2023    ECHOCARDIOGRAM REPORT   Patient Name:   Billy Franco Date of Exam: 05/15/2023 Medical Rec #:  [id number]      Height:       67.0 in Accession #:    [id number]     Weight:       163.1 lb Date of Birth:  1940/02/26      BSA:          1.855 m Patient Age:    82 years       BP:           Not listed in chart/Not listed in                                              chart mmHg Patient Gender: M              HR:           42 bpm. Exam Location:  [facility] Procedure: 2D Echo, Cardiac Doppler and Color Doppler (Both Spectral and Color            Flow Doppler were utilized during procedure). Indications:     Abnormal ECG R94.31  History:         Patient has prior history of Echocardiogram examinations, most                  recent 12/22/2019.  Sonographer:     Broadus Canes Referring Phys:  [id number] Delanna Fears Diagnosing Phys: Dwayne D Callwood MD IMPRESSIONS  1. Left ventricular ejection fraction, by estimation, is 25 to 30%. 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 The left ventricle demonstrates global hypokinesis. The left ventricular internal cavity size was mildly dilated. There is mild asymmetric left ventricular hypertrophy of the septal segment. Left ventricular diastolic parameters are consistent with Grade III diastolic  dysfunction (restrictive).  2. Right ventricular systolic function is low normal. The right ventricular size is mildly enlarged.  3. The mitral valve is normal in structure. Mild to moderate mitral valve regurgitation.  4. The aortic valve is normal in structure. Aortic valve regurgitation is mild. FINDINGS  Left Ventricle: Left ventricular ejection fraction, by estimation, is 25 to 30%. 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 The left ventricle demonstrates global hypokinesis. Strain was performed and the global longitudinal strain is indeterminate. The left ventricular internal cavity size was mildly dilated. There is mild asymmetric left ventricular hypertrophy of the septal segment. Left ventricular diastolic parameters are consistent with Grade III diastolic dysfunction (restrictive). Right Ventricle: The right ventricular size is mildly enlarged. No increase in right ventricular wall thickness. Right ventricular systolic function is low normal. Left Atrium: Left atrial size was normal in size. Right Atrium: Right atrial size was normal in size. Pericardium: There is no evidence of pericardial effusion. Mitral Valve:  The mitral valve is normal in structure. Mild to moderate mitral valve regurgitation. Tricuspid Valve: The tricuspid valve is normal in structure. Tricuspid valve regurgitation is trivial. Aortic Valve: The aortic valve is normal in structure. Aortic valve regurgitation is mild. Aortic valve mean gradient measures 2.0 mmHg. Aortic valve peak gradient measures 4.4 mmHg. Aortic valve area, by VTI measures 2.13 cm. Pulmonic Valve: The pulmonic valve was normal in structure. Pulmonic valve regurgitation is not visualized. Aorta: The ascending aorta was not well visualized. IAS/Shunts: No atrial level shunt detected by color flow Doppler. Additional Comments: 3D was performed not requiring image post processing on an independent workstation and was indeterminate.  LEFT VENTRICLE PLAX 2D  LVIDd:         4.90 cm      Diastology LVIDs:         4.50 cm      LV e' medial:    5.44 cm/s LV PW:         1.00 cm      LV E/e' medial:  17.2 LV IVS:        1.40 cm      LV e' lateral:   9.68 cm/s LVOT diam:     2.00 cm      LV E/e' lateral: 9.7 LV SV:         51 LV SV Index:   27 LVOT Area:     3.14 cm  LV Volumes (MOD) LV vol d, MOD A2C: 99.9 ml LV vol d, MOD A4C: 106.0 ml LV vol s, MOD A2C: 88.8 ml LV vol s, MOD A4C: 78.3 ml LV SV MOD A2C:     11.1 ml LV SV MOD A4C:     106.0 ml LV SV MOD BP:      15.2 ml RIGHT VENTRICLE RV Basal diam:  4.70 cm RV Mid diam:    3.40 cm LEFT ATRIUM             Index        RIGHT ATRIUM           Index LA diam:        3.30 cm 1.78 cm/m   RA Area:     17.80 cm LA Vol (A2C):   42.3 ml 22.81 ml/m  RA Volume:   45.80 ml  24.70 ml/m LA Vol (A4C):   43.9 ml 23.67 ml/m LA Biplane Vol: 42.7 ml 23.02 ml/m  AORTIC VALVE AV Area (Vmax):    2.36 cm AV Area (Vmean):   1.88 cm AV Area (VTI):     2.13 cm AV Vmax:           105.00 cm/s AV Vmean:          68.900 cm/s AV VTI:            0.238 m AV Peak Grad:      4.4 mmHg AV Mean Grad:      2.0 mmHg LVOT Vmax:         79.00 cm/s LVOT Vmean:        41.200 cm/s LVOT VTI:          0.161 m LVOT/AV VTI ratio: 0.68  AORTA Ao Root diam: 3.20 cm MITRAL VALVE               TRICUSPID VALVE MV Area (PHT): 1.75 cm    TR Peak grad:   16.8 mmHg MV Decel Time: 434 msec    TR Vmax:  205.00 cm/s MV E velocity: 93.80 cm/s MV A velocity: 38.80 cm/s  SHUNTS MV E/A ratio:  2.42        Systemic VTI:  0.16 m                            Systemic Diam: 2.00 cm Antonette Batters MD Electronically signed by Antonette Batters MD Signature Date/Time: 05/15/2023/1:01:58 PM    Final    CT CHEST WO CONTRAST Result Date: 05/13/2023 CLINICAL DATA:  Spinal cord injury aspiration follow-up EXAM: CT CHEST WITHOUT CONTRAST TECHNIQUE: Multidetector CT imaging of the chest was performed following the standard protocol without IV contrast. RADIATION DOSE REDUCTION: This  exam was performed according to the departmental dose-optimization program which includes automated exposure control, adjustment of the mA and/or kV according to patient size and/or use of iterative reconstruction technique. COMPARISON:  Radiograph 05/13/2023 FINDINGS: Cardiovascular: Limited evaluation without intravenous contrast. Mild aortic atherosclerosis. No aneurysm. Left upper extremity central venous catheter with tip at the cavoatrial junction. Coronary vascular calcification. Cardiomegaly. Trace pericardial effusion Mediastinum/Nodes: Endotracheal tube with tip several cm superior to the carina. Mild debris within the distal trachea and right bronchus. Multiple calcified lymph nodes consistent with prior granulomatous disease. Esophagus is unremarkable Lungs/Pleura: Trace left and small moderate right pleural effusions. Mild dependent atelectasis in the lower lobes. No pneumothorax Upper Abdomen: No acute finding. Left renal pelvis stone measuring about 21 mm. Musculoskeletal: Sternum appears intact. No acute osseous abnormality. IMPRESSION: 1. Small left and small to moderate right pleural effusions. Mild dependent atelectasis in the lungs. Mild debris or secretions in the distal trachea and right bronchus. 2. Cardiomegaly.  Trace pericardial effusion. 3. Evidence of prior granulomatous disease. 4. Left kidney stone Aortic Atherosclerosis (ICD10-I70.0). Electronically Signed   By: Esmeralda Hedge M.D.   On: 05/13/2023 23:16   CT CERVICAL SPINE WO CONTRAST Result Date: 05/13/2023 CLINICAL DATA:  Altered mental status EXAM: CT HEAD WITHOUT CONTRAST CT CERVICAL SPINE WITHOUT CONTRAST TECHNIQUE: Multidetector CT imaging of the head and cervical spine was performed following the standard protocol without intravenous contrast. Multiplanar CT image reconstructions of the cervical spine were also generated. RADIATION DOSE REDUCTION: This exam was performed according to the departmental dose-optimization program  which includes automated exposure control, adjustment of the mA and/or kV according to patient size and/or use of iterative reconstruction technique. COMPARISON:  05/10/2023 FINDINGS: CT HEAD FINDINGS Brain: No mass,hemorrhage or extra-axial collection. Unchanged hypodensity within the right PCA territory. Vascular: No hyperdense vessel or unexpected vascular calcification. Skull: The visualized skull base, calvarium and extracranial soft tissues are normal. Sinuses/Orbits: No fluid levels or advanced mucosal thickening of the visualized paranasal sinuses. No mastoid or middle ear effusion. Normal orbits. Other: None. CT CERVICAL SPINE FINDINGS Alignment: No static subluxation. Facets are aligned. Occipital condyles are normally positioned. Skull base and vertebrae: C3-5 ACDF.  No acute fracture. Soft tissues and spinal canal: No prevertebral fluid or swelling. No visible canal hematoma. Disc levels: C2-3: Moderate spinal canal stenosis. C3-4: Severe spinal canal stenosis. C4-5: Mild spinal canal stenosis. C5-6: Unremarkable. C6-7: Mild spinal canal stenosis. Upper chest: No pneumothorax, pulmonary nodule or pleural effusion. Other: Normal visualized paraspinal cervical soft tissues. IMPRESSION: 1. No acute intracranial abnormality. 2. No acute fracture or static subluxation of the cervical spine. 3. 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 Electronically Signed   By: Juanetta Nordmann M.D.   On: 05/13/2023 22:59   CT  HEAD WO CONTRAST ( ) Result Date: 05/13/2023 CLINICAL DATA:  Altered mental status EXAM: CT HEAD WITHOUT CONTRAST CT CERVICAL SPINE WITHOUT CONTRAST TECHNIQUE: Multidetector CT imaging of the head and cervical spine was performed following the standard protocol without intravenous contrast. Multiplanar CT image reconstructions of the cervical spine were also generated. RADIATION DOSE REDUCTION: This exam was performed according to the departmental dose-optimization program which includes  automated exposure control, adjustment of the mA and/or kV according to patient size and/or use of iterative reconstruction technique. COMPARISON:  05/10/2023 FINDINGS: CT HEAD FINDINGS Brain: No mass,hemorrhage or extra-axial collection. Unchanged hypodensity within the right PCA territory. Vascular: No hyperdense vessel or unexpected vascular calcification. Skull: The visualized skull base, calvarium and extracranial soft tissues are normal. Sinuses/Orbits: No fluid levels or advanced mucosal thickening of the visualized paranasal sinuses. No mastoid or middle ear effusion. Normal orbits. Other: None. CT CERVICAL SPINE FINDINGS Alignment: No static subluxation. Facets are aligned. Occipital condyles are normally positioned. Skull base and vertebrae: C3-5 ACDF.  No acute fracture. Soft tissues and spinal canal: No prevertebral fluid or swelling. No visible canal hematoma. Disc levels: C2-3: Moderate spinal canal stenosis. C3-4: Severe spinal canal stenosis. C4-5: Mild spinal canal stenosis. C5-6: Unremarkable. C6-7: Mild spinal canal stenosis. Upper chest: No pneumothorax, pulmonary nodule or pleural effusion. Other: Normal visualized paraspinal cervical soft tissues. IMPRESSION: 1. No acute intracranial abnormality. 2. No acute fracture or static subluxation of the cervical spine. 3. 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 Electronically Signed   By: Juanetta Nordmann M.D.   On: 05/13/2023 22:59   DG Chest Port 1 View Result Date: 05/13/2023 CLINICAL DATA:  Status post intubation EXAM: PORTABLE CHEST 1 VIEW COMPARISON:  May 10, 2023 FINDINGS: The heart size and mediastinal contours are within normal limits. Both lungs are clear. The visualized skeletal structures are unremarkable. Tip of the endotracheal tube 2 cm from carina Left PICC line tip in the SVC IMPRESSION: No active disease. Electronically Signed   By: Fredrich Jefferson M.D.   On: 05/13/2023 16:14   US  EKG SITE RITE Result Date:  05/12/2023 If [facility] image not attached, placement could not be confirmed due to current cardiac rhythm.  DG Cervical Spine 2-3 Views Result Date: 05/11/2023 CLINICAL DATA:  Elective surgery. EXAM: CERVICAL SPINE - 2-3 VIEW COMPARISON:  Preoperative imaging FINDINGS: Two fluoroscopic spot views of the cervical spine submitted from the operating room. Anterior fusion C3 through C5 with interbody spacers. Fluoroscopy time 4 seconds. Dose 0.29 mGy. IMPRESSION: Intraoperative fluoroscopy during cervical spine fusion. Electronically Signed   By: Chadwick Colonel M.D.   On: 05/11/2023 17:04   DG C-Arm 1-60 Min-No Report Result Date: 05/11/2023 Fluoroscopy was utilized by the requesting physician.  No radiographic interpretation.   DG C-Arm 1-60 Min-No Report Result Date: 05/11/2023 Fluoroscopy was utilized by the requesting physician.  No radiographic interpretation.   MR Cervical Spine Wo Contrast Result Date: 05/10/2023 CLINICAL DATA:  Ataxia, cervical trauma arm weakness, eval central cord EXAM: MRI CERVICAL SPINE WITHOUT CONTRAST TECHNIQUE: Multiplanar, multisequence MR imaging of the cervical spine was performed. No intravenous contrast was administered. COMPARISON:  None Available. FINDINGS: Alignment: No substantial sagittal subluxation. Vertebrae: No bone marrow edema to suggest acute fracture. Edema in the anterior C6-C7 disc space with extension anteriorly and destruction of the anterior longitudinal ligament (see series 3, image 8). Adjacent prevertebral edema which extends inferiorly into the upper thoracic spine. Cord: T2 hyperintensity within the cord at C3-C4. Posterior Fossa,  vertebral arteries, paraspinal tissues: The visualized 2 artery flow voids are maintained. Small volume of prevertebral edema in the lower cervical spine extending into the upper thoracic spine. Disc levels: C2-C3: Small posterior disc osteophyte complex. Mild canal stenosis. Bilateral facet uncovertebral hypertrophy  with mild left foraminal stenosis. C3-C4: Posterior disc osteophyte complex and ligamentum flavum thickening. Resulting severe canal stenosis with cord compression. Bilateral facet uncovertebral hypertrophy. Resulting severe bilateral foraminal stenosis. C4-C5: Posterior disc osteophyte complex and ligamentum flavum thickening. Resulting moderate to severe canal stenosis. Bilateral facet and vertebral hypertrophy with severe bilateral foraminal stenosis. C5-C6: Segmentation anomaly. Partial fusion across the disc. Bilateral facet uncovertebral hypertrophy. Canal and foramina are patent. C6-C7: Posterior disc osteophyte complex with central disc protrusion. Bilateral facet and uncovertebral hypertrophy. Moderate bilateral foraminal stenosis. Mild to moderate canal stenosis. C7-T1: Uncovertebral hypertrophy without significant canal or foraminal stenosis. IMPRESSION: 1. 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 Edema extends posteriorly to involve the C6-C7 disc, compatible with traumatic disc injury. Small volume of adjacent prevertebral edema. 2. At C3-C4, severe canal stenosis with cord compression. T2 hyperintensity in the cord at this level is compatible with cord edema and/or myelomalacia. Severe bilateral foraminal stenosis at this level. 3. At C4-C5, moderate to severe canal stenosis and severe bilateral foraminal stenosis. 4. At C6-C7, mild to moderate canal stenosis and moderate bilateral foraminal stenosis. Electronically Signed   By: Stevenson Elbe M.D.   On: 05/10/2023 20:41   MR BRAIN WO CONTRAST Result Date: 05/10/2023 CLINICAL DATA:  Neuro deficit, acute, stroke suspected left sided weakness EXAM: MRI HEAD WITHOUT CONTRAST TECHNIQUE: Multiplanar, multiecho pulse sequences of the brain and surrounding structures were obtained without intravenous contrast. COMPARISON:  CT head and CT cervical spine from earlier today. FINDINGS: Brain: No acute infarction, hemorrhage, hydrocephalus,  extra-axial collection or mass lesion. Remote right PCA territory infarct. Vascular: Major arterial flow voids are maintained at the skull base. Skull and upper cervical spine: Normal marrow signal. Sinuses/Orbits: Negative. IMPRESSION: 1. No evidence of acute intracranial abnormality. 2. Remote right PCA territory infarct. Electronically Signed   By: Stevenson Elbe M.D.   On: 05/10/2023 20:22   CT Thoracic Spine Wo Contrast Result Date: 05/10/2023 CLINICAL DATA:  Back trauma, fall from chair. EXAM: CT THORACIC AND LUMBAR SPINE WITHOUT CONTRAST TECHNIQUE: Multidetector CT imaging of the thoracic and lumbar spine was performed without contrast. Multiplanar CT image reconstructions were also generated. RADIATION DOSE REDUCTION: This exam was performed according to the departmental dose-optimization program which includes automated exposure control, adjustment of the mA and/or kV according to patient size and/or use of iterative reconstruction technique. COMPARISON:  10/08/2022, 04/28/2022 FINDINGS: CT THORACIC SPINE FINDINGS Alignment: Normal. Vertebrae: No acute fracture or focal pathologic process. Paraspinal and other soft tissues: No paraspinal fat stranding or epidural hematoma. Aortic atherosclerosis is noted. There are multi-vessel coronary artery calcifications. Pleural and parenchymal scarring is noted at the lung apices. There are trace bilateral pleural effusions with atelectasis. Disc levels: Multilevel degenerative endplate changes. There is calcification of the supraspinous ligament. CT LUMBAR SPINE FINDINGS Segmentation: 5 lumbar type vertebrae. Alignment: Normal. Vertebrae: There is a old fracture of the L1, L2, and L3 transverse processes on the left with bone callus formation. No acute fracture is seen. Paraspinal and other soft tissues: No acute abnormality. Aortic atherosclerosis is seen. Left renal calculi are noted. Disc levels: Multi level degenerative endplate changes and facet  arthropathy. There is a disc herniation at L4-L5 resulting in severe spinal canal stenosis and moderate  to severe neural foraminal stenosis bilaterally, greater on the left than on the right. The spinal canal and neural foramina are otherwise patent. IMPRESSION: CT THORACIC SPINE IMPRESSION 1. Multilevel degenerative changes without evidence of acute fracture. 2. Trace bilateral pleural effusions with atelectasis. 3. Aortic atherosclerosis with coronary artery calcifications. CT LUMBAR SPINE IMPRESSION 1. 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 2. Multilevel degenerative changes in the lumbar spine as described above. 3. Left renal calculi. Electronically Signed   By: Wyvonnia Heimlich M.D.   On: 05/10/2023 20:01   CT Lumbar Spine Wo Contrast Result Date: 05/10/2023 CLINICAL DATA:  Back trauma, fall from chair. EXAM: CT THORACIC AND LUMBAR SPINE WITHOUT CONTRAST TECHNIQUE: Multidetector CT imaging of the thoracic and lumbar spine was performed without contrast. Multiplanar CT image reconstructions were also generated. RADIATION DOSE REDUCTION: This exam was performed according to the departmental dose-optimization program which includes automated exposure control, adjustment of the mA and/or kV according to patient size and/or use of iterative reconstruction technique. COMPARISON:  10/08/2022, 04/28/2022 FINDINGS: CT THORACIC SPINE FINDINGS Alignment: Normal. Vertebrae: No acute fracture or focal pathologic process. Paraspinal and other soft tissues: No paraspinal fat stranding or epidural hematoma. Aortic atherosclerosis is noted. There are multi-vessel coronary artery calcifications. Pleural and parenchymal scarring is noted at the lung apices. There are trace bilateral pleural effusions with atelectasis. Disc levels: Multilevel degenerative endplate changes. There is  calcification of the supraspinous ligament. CT LUMBAR SPINE FINDINGS Segmentation: 5 lumbar type vertebrae. Alignment: Normal. Vertebrae: There is a old fracture of the L1, L2, and L3 transverse processes on the left with bone callus formation. No acute fracture is seen. Paraspinal and other soft tissues: No acute abnormality. Aortic atherosclerosis is seen. Left renal calculi are noted. Disc levels: Multi level degenerative endplate changes and facet arthropathy. There is a disc herniation at L4-L5 resulting in severe spinal canal stenosis and moderate to severe neural foraminal stenosis bilaterally, greater on the left than on the right. The spinal canal and neural foramina are otherwise patent. IMPRESSION: CT THORACIC SPINE IMPRESSION 1. Multilevel degenerative changes without evidence of acute fracture. 2. Trace bilateral pleural effusions with atelectasis. 3. Aortic atherosclerosis with coronary artery calcifications. CT LUMBAR SPINE IMPRESSION 1. 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 2. Multilevel degenerative changes in the lumbar spine as described above. 3. Left renal calculi. Electronically Signed   By: Wyvonnia Heimlich M.D.   On: 05/10/2023 20:01   CT Cervical Spine Wo Contrast Result Date: 05/10/2023 CLINICAL DATA:  Fall, weakness EXAM: CT CERVICAL SPINE WITHOUT CONTRAST TECHNIQUE: Multidetector CT imaging of the cervical spine was performed without intravenous contrast. Multiplanar CT image reconstructions were also generated. RADIATION DOSE REDUCTION: This exam was performed according to the departmental dose-optimization program which includes automated exposure control, adjustment of the mA and/or kV according to patient size and/or use of iterative reconstruction technique. COMPARISON:  None Available. FINDINGS: Alignment: No subluxation.  Facet  alignment within normal limits. Skull base and vertebrae: No fracture Soft tissues and spinal canal: No prevertebral fluid or swelling. No visible canal hematoma. Disc levels: Prominent multilevel anterior osteophytes. Fusion of the C5-C6 vertebral bodies and posterior elements. Multilevel degenerative changes with osteophytes and mild disc space narrowing. Small central disc protrusion at C2-C3. At least mild canal stenosis. Posterior  disc osteophyte complex at C3-C4 with moderate severe canal stenosis. Posterior disc osteophyte C4-C5 with at least moderate canal stenosis. Posterior disc osteophyte C6-C7 with at least mild canal stenosis and mild mass effect on the anterior thecal sac. Multilevel facet degenerative change. Multilevel foraminal narrowing. Upper chest: Apical scarring Other: None IMPRESSION: 1. Negative for acute fracture or subluxation of the cervical spine. 2. 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 Electronically Signed   By: Esmeralda Hedge M.D.   On: 05/10/2023 17:03   DG Chest Port 1 View Result Date: 05/10/2023 CLINICAL DATA:  Weakness EXAM: PORTABLE CHEST 1 VIEW COMPARISON:  12/21/2019 FINDINGS: Pleural-parenchymal scarring at the apices. No consolidation, pleural effusion or pneumothorax. Borderline to mild cardiomegaly. Aortic atherosclerosis. IMPRESSION: No active disease. Borderline to mild cardiomegaly. Electronically Signed   By: Esmeralda Hedge M.D.   On: 05/10/2023 16:55   CT Head Wo Contrast Result Date: 05/10/2023 CLINICAL DATA:  Head trauma weakness EXAM: CT HEAD WITHOUT CONTRAST TECHNIQUE: Contiguous axial images were obtained from the base of the skull through the vertex without intravenous contrast. RADIATION DOSE REDUCTION: This exam was performed according to the departmental dose-optimization program which includes automated exposure control, adjustment of the mA and/or kV according to patient size and/or use of iterative  reconstruction technique. COMPARISON:  CT brain 05/02/2019 FINDINGS: Brain: Mild motion degradation limits the exam. Interval small chronic appearing right occipital infarct. Mild atrophy. Patchy white matter hypodensity most likely chronic small vessel ischemic change. The ventricles are non enlarged. Vascular: No hyperdense vessels.  No unexpected calcification Skull: No definitive fracture but limited by motion Sinuses/Orbits: No acute finding. Other: None IMPRESSION: 1. Mild motion degradation limits the exam. No definite CT evidence for acute intracranial abnormality. 2. Atrophy and chronic small vessel ischemic changes of the white matter. Interval small chronic appearing right occipital infarct, new compared to 2021 head CT. Electronically Signed   By: Esmeralda Hedge M.D.   On: 05/10/2023 16:53    ECHO: 05/14/2020  MODERATE LV SYSTOLIC DYSFUNCTION  MILD RV SYSTOLIC DYSFUNCTION  NO VALVULAR STENOSIS  MODERATE MR, TR  MILD AR, PR  EF 30%   TELEMETRY reviewed by me 05/18/2023: not on tele  EKG reviewed by me: Sinus rhythm with PACs, rate 67 bpm  Data reviewed by me 05/18/2023: last 24h vitals tele labs imaging I/O ED provider note, admission H&P, neurosurgery, critical care notes.  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 male  with a past medical history of CAD s/p stent (2022), chronic systolic congestive heart failure (EF 25% - 30% - 04/2020), Ischemic Cardiomyopathy (refuses AICD), AF RVR (not on Eliquis  due to hematuria), T2DM, CKD, hypothyroidism who presented to the ED on 05/10/2023 for falling out of a chair on Friday. Patient is requiring surgical intervention and needs cardiac  clearance. Cardiology was consulted for further evaluation.   # Aspiration # Fall, Fracture/disc herniation at C5/6 with moderate stenosis # Paroxysmal Atrial fibrillation # Chronic systolic congestive heart failure (EF 25% - 30% - 04/2020)  # Ischemic Cardiomyopathy (refuses AICD) # CAD s/p stent placement Patient presents to the ED after falling from chair  on Friday leading to a new fracture/disc herniation at C5/6 s/p surgical decompression 05/11/23. Patient has history of atrial fibrillation, in AF overnight 4/22. Patient is not on DOAC due to history of hematuria. Had aspiration event on 4/23 requiring intubation. -Continue holding plavix , per neurosurgery may resume 14 days after surgery (RESUME 05/25/23). Continue asa 81 mg daily in the interim. -Continue Atorvastatin  10 mg daily. -Will continue to hold off on any beta blockers as he remains bradycardic. Patient unable to tolerate DOAC due to hematuria.  -Resume home spironolactone 12.5 mg daily. Consider resuming losartan  outpatient if BP remains stable. Has been unable to afford Entresto  in the past.  Cardiology will sign off. Please haiku with questions or re-engage if needed.  Plan for follow up in clinic with Dr. Beau Bound in 1 week from DC.  This patient's plan of care was discussed and created with Dr. Parks Bollman and he is in agreement.    Signed: Hamp Levine, PA-C  05/18/2023, 11:04 AM [facility] Cardiology

## 2023-05-18 NOTE — Plan of Care (Signed)

## 2023-05-19 DIAGNOSIS — S14109A Unspecified injury at unspecified level of cervical spinal cord, initial encounter: Secondary | ICD-10-CM | POA: Diagnosis not present

## 2023-05-19 LAB — GLUCOSE, CAPILLARY
Glucose-Capillary: 81 mg/dL (ref 70–99)
Glucose-Capillary: 62 mg/dL — ABNORMAL LOW (ref 70–99)
Glucose-Capillary: 92 mg/dL (ref 70–99)
Glucose-Capillary: 153 mg/dL — ABNORMAL HIGH (ref 70–99)
Glucose-Capillary: 153 mg/dL — ABNORMAL HIGH (ref 70–99)

## 2023-05-19 LAB — PHOSPHORUS: Phosphorus: 2.7 mg/dL (ref 2.5–4.6)

## 2023-05-19 LAB — MAGNESIUM: Magnesium: 1.8 mg/dL (ref 1.7–2.4)

## 2023-05-19 MED ORDER — INSULIN GLARGINE-YFGN 100 UNIT/ML ~~LOC~~ SOLN
5.0000 [IU] | Freq: Every day | SUBCUTANEOUS | Status: DC
  Administered 2023-05-20 – 2023-05-22 (×3): 5 [IU] via SUBCUTANEOUS
  Filled 2023-05-19 (×3): qty 0.05

## 2023-05-19 MED ORDER — LOPERAMIDE HCL 2 MG PO CAPS
2.0000 mg | ORAL_CAPSULE | Freq: Once | ORAL | Status: AC
  Administered 2023-05-19: 2 mg via ORAL
  Filled 2023-05-19: qty 1

## 2023-05-19 NOTE — Progress Notes (Signed)
 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   He was having difficulty grasping and having weakness and sensory loss- He actually required  ICU admission due to hypotension, which is common.  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 He needed this due to severe canal stenosis with Cord compression- he also had disruption of Anterior longitudinal ligament at C6/7- with edema c/w traumatic disc injury. Of note, he also had moderate ot severe canal stenosis and B/L foraminal stenosis at C3/4 and C4/5 as well.   Since surgery, he has been working with PT and OT- however he is still severely impaired- and requiring Max A of 2 people to even try sit to stand or do sitting balance, which is poor- he was not able to clear buttocks from bed with last PT note in spite of 2 people max A assisting.   Per notes, pt also developing already spasticity- and increased tone in LE's- not clear if this is due to  new spasticity, which bodes poorly for outcome, or already had spasticity due to cord compression from before. This would need to be treated for him to have success of progressing as well.  It's mentioned in therapy notes, but is not mentioned in NSU notes.    Per notes, pt is more of central cord syndrome, however I usually see central cord if patient starts out before surgery with significantly more strength in LE's than Ue's- - not preferentially getting better more in LE's after surgery- this is important because central cord has a pretty good prognosis for walking, however incomplete quad patients that get back more in LE's after surgery don't have as good of a prognosis.  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  Many ASIA C patients that are elderly can have a lot of needs long term- and would need physical assistance life long- most ASIA D (where >50% of muscles are >3/5/antigravity) usually can walk eventually with RW when younger, however his age puts him in a category of still requiring some physical assistance for Ue's base don current NSU exam.   Spasticity can also play a role in recovery as detailed above- it appears had some spasticity BEFORE surgery, so likely had compression/spasticity before his falls- since this takes 3-6 weeks to show up after SCI.   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   If his family cannot give PHYSICAL assistance, then we would have ot look more at SNF setting for longer therapy treatment for pt, in the setting of his very complex medical issues as well as his age- we  will d/w Admissions coordinator and ask she looks into pt's family assistance and go from there.   Regardless, I would like patient to see me in the outpatient clinic, for his SCI- since I'm one of the few SCI rehab physicians in [place]- so please place a referral at d/c, if not coming to CIR, for PM&R, Dr Ramina Hulet for SCI care.    Thank you.

## 2023-05-19 NOTE — Progress Notes (Addendum)
 PROGRESS NOTE    Billy Franco  ZOX:[id number] DOB: [birth year] DOA: 05/10/2023 PCP: Sowles, Krichna, MD  [id number]/[id number]-AA  LOS: 9 days   Brief hospital course:   Assessment & Plan: 83 y.o male with significant PMH of T2DM, CKD stage III, HTN, HLD, HFrEF with EF 25-30%, refused AICD, ischemic Cardiomyopathy, A-Fib not on anticoagulation due to gross hematuria, CAD s/p PCI and stent, RLS and chronic back pain who presented to the ED with chief complaints of bilateral upper and lower extremities weakness and numbness s/p fall.   4/20:admit with cervical spine injury following a traumatic fall 05/11/23- patient s/p surgery today.  Lovenox  to initiate in am per nsgy. I met with patient and family at bedside. He appears stable and is in good spirits   05/12/23- patient doing well after decomrpession.  Patient reports feelings of falling and moving of med, I repositioned patient and reviewed what I could with family but patient still complains of these symptoms,  reviewed this with neurosurgeon.  He remains on neosynephrine.     05/13/23- JP drain removed,  patient with confusion/aggitation overnight started on precedex . Developing worsening AKI , have reviewed meds and started D5-HcO3 @75cc /hr , may need to discuss with family regarding intubation /GOC 05/14/23- patient unable to get NGT for dobhoff today.  AKI is improved.  For awakening trial today.  05/15/23- patient for SBT today currently on 10/5 PS, mentation is improved following verbal communication, he's off levophed  now.  AKI improved. Patient liberated from MV. 05/16/23-patient improved being optimized for TRH transfer.  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 Ok to remove when in bed and when eating (when able)  --completed dexamethasone  taper --CIR eval pending   #Chronic HFrEF/Ischemic cardiomyopathy:  EF 40-45%  --stable  #Chronic A-Fib not on  anticoagulation due to gross hematuria --rate controlled   CAD s/p PCI and stent --cont ASA --resume plavix  on 5/5 --cont statin  #AKI on CKD Stage III Oral hydration now   #Diabetes Mellitus --reduce glargine from 5u BID to daily --ACHS and SSI   #Hypothyroidism -Continue home synthroid    #RLS -Continue Requip    DVT prophylaxis: Lovenox  SQ Code Status: Full code  Family Communication: wife updated at bedside today Level of care: Med-Surg Dispo:   The patient is from: home Anticipated d/c is to: CIR Anticipated d/c date is: whenever bed available   Subjective and Interval History:  Pt said he has no grip strength in his hands.  His wife was concerned about his hands being cold and right finger tips being blue.     Objective: Vitals:   05/19/23 0500 05/19/23 0520 05/19/23 0942 05/19/23 1655  BP:  115/61 (!) 111/52 (!) 95/44  Pulse:  62 65 (!) 58  Resp:  18 18 18   Temp:  (!) 97.5 F (36.4 C) 97.8 F (36.6 C) 97.9 F (36.6 C)  TempSrc:  Oral Oral Oral  SpO2:  100% 100% 99%  Weight: 84 kg     Height:        Intake/Output Summary (Last 24 hours) at 05/19/2023 1811 Last data filed at 05/19/2023 1345 Gross per 24 hour  Intake --  Output 1800 ml  Net -1800 ml   Filed Weights   05/16/23 0500 05/18/23 0500 05/19/23 0500  Weight: 71 kg 72 kg 84 kg    Examination:   Constitutional: NAD, AAOx3 HEENT: conjunctivae and lids normal, EOMI CV: No cyanosis.  RESP: normal respiratory effort, on RA Extremities: both hands mildly swollen, cold, right radial pulse palpable, right finger tips slightly dusky but not blue, right finger nail cap refills < 2 sec. Psych: Normal mood and affect.     Data Reviewed: I have personally reviewed labs and imaging studies  Time spent: 35 minutes  Garrison Kanner, MD [facility] If 7PM-7AM, please contact night-coverage 05/19/2023, 6:11 PM

## 2023-05-19 NOTE — Plan of Care (Signed)
  Problem: Education: Goal: Ability to describe self-care measures that may prevent or decrease complications (Diabetes Survival Skills Education) will improve Outcome: Progressing   Problem: Skin Integrity: Goal: Risk for impaired skin integrity will decrease Outcome: Progressing   Problem: Tissue Perfusion: Goal: Adequacy of tissue perfusion will improve Outcome: Progressing   Problem: Activity: Goal: Risk for activity intolerance will decrease Outcome: Progressing

## 2023-05-19 NOTE — Progress Notes (Signed)
 Inpatient Rehab Admissions Coordinator:   CIR consult received. Rehab MD to enter note with recommendations and goals today. I will follow up with pt. After that is completed.   Wandalee Gust, MS, CCC-SLP Rehab Admissions Coordinator  [phone] (celll) [phone] (office)

## 2023-05-19 NOTE — Progress Notes (Signed)
 Physical Therapy Treatment Patient Details Name: Billy Franco MRN: [id number] DOB: October 16, 1940 Today's Date: 05/19/2023   History of Present Illness Pt is an 83 y/o M admitted on 05/10/23 after presenting with c/o BUE & BLE weakness & numbness s/p fall. 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 Pt underwent C3-5 ACDF. Pt was intubated on 05/13/23, extubated on 05/15/23. PMH: DM2, CKD 3, HTN, HLD, HFrEF, refused AICD, ischemic cardiomyopathy, a-fib not on anticoagulation due to gross hematuria, CAD s/p PCI & stent, GERD, RLS, chronic back pain.    PT Comments  Pt resting in bed upon PT arrival; pt's wife present but left for appt; pt's daughter present during session.  Pt appearing motivated and eager to participate in therapy.  During session pt was max assist x2 with bed mobility via logrolling; CGA to min assist for sitting balance (d/t posterior L lean intermittently); and max assist x2 to stand via use of Dwana Gist multiple trials (pt unable to hold onto Volo d/t B hand weakness but Stedy did well blocking pt's knees to assist with standing; pt able to actively assist with LE's more on last 1/3rd of standing compared to initial 2/3rd of standing (with bed height significantly elevated); see below for further details).  Will continue to focus on strengthening, balance, and progressive functional mobility during hospitalization.   If plan is discharge home, recommend the following: Two people to help with walking and/or transfers;Two people to help with bathing/dressing/bathroom;Assistance with cooking/housework;Assist for transportation;Help with stairs or ramp for entrance;Assistance with feeding   Can travel by private vehicle        Equipment Recommendations  [place] lift;Wheelchair cushion (measurements PT);Wheelchair (measurements PT);Hospital bed    Recommendations for Other Services Rehab  consult     Precautions / Restrictions Precautions Precautions: Fall;Cervical Precaution/Restrictions Comments: Hard collar (remove in bed and for meals) Required Braces or Orthoses: Cervical Brace Cervical Brace: Hard collar Restrictions Weight Bearing Restrictions Per Provider Order: No     Mobility  Bed Mobility Overal bed mobility: Needs Assistance Bed Mobility: Rolling, Sidelying to Sit, Sit to Sidelying Rolling: Max assist, +2 for safety/equipment, Used rails Sidelying to sit: Max assist, +2 for physical assistance, HOB elevated, Used rails     Sit to sidelying: Max assist, +2 for physical assistance, HOB elevated, Used rails General bed mobility comments: vc's for logrolling technique; assist for trunk and B LE's    Transfers Overall transfer level: Needs assistance Equipment used:  Dwana Gist) Transfers: Sit to/from Stand Sit to Stand: Max assist, +2 physical assistance           General transfer comment: utilized Dwana Gist (did well blocking pt's B knees although requring assist for B LE positioning--pt tending to bring feet together for narrow BOS; pt unable to maintain B hand grip d/t B hand weakness); assist to initiate and come to full stand (x3 trials from significantly elevated bed height up to [employer] and x1 trial standing from Dwana Gist); pt able to push through LE's more to assist with end of range compared to initial range (pt able to assist more with last 1/3rd of standing); vc's for technique Transfer via Lift Equipment: Stedy  Ambulation/Gait               General Gait Details: Not appropriate at this time   Optometrist  Tilt Bed    Modified Rankin (Stroke Patients Only)       Balance Overall balance assessment: Needs assistance Sitting-balance support: Bilateral upper extremity supported, Feet supported Sitting balance-Leahy Scale: Poor Sitting balance - Comments: fluctuating between min  assist to CGA (posterior L lean); vc's required to correct Postural control: Posterior lean, Left lateral lean Standing balance support: Bilateral upper extremity supported, Reliant on assistive device for balance Standing balance-Leahy Scale: Zero                              Communication Communication Communication: No apparent difficulties  Cognition Arousal: Alert Behavior During Therapy: WFL for tasks assessed/performed   PT - Cognitive impairments: Awareness, Initiation, Sequencing, Problem solving, Safety/Judgement                       PT - Cognition Comments: Impaired processing noted with activities Following commands: Impaired Following commands impaired: Follows one step commands with increased time    Cueing Cueing Techniques: Verbal cues, Gestural cues, Visual cues, Tactile cues  Exercises      General Comments General comments (skin integrity, edema, etc.): B UE edema noted.  Pt's R LE appearing to have more tone than L LE.      Pertinent Vitals/Pain Pain Assessment Pain Assessment: No/denies pain Pain Intervention(s): Limited activity within patient's tolerance, Monitored during session HR 60-64 bpm beginning/end of session at rest.    Home Living                          Prior Function            PT Goals (current goals can now be found in the care plan section) Acute Rehab PT Goals Patient Stated Goal: to be able to walk PT Goal Formulation: With patient/family Time For Goal Achievement: 05/31/23 Potential to Achieve Goals: Fair Progress towards PT goals: Progressing toward goals    Frequency    Min 3X/week      PT Plan      Co-evaluation              AM-PAC PT "6 Clicks" Mobility   Outcome Measure  Help needed turning from your back to your side while in a flat bed without using bedrails?: A Lot Help needed moving from lying on your back to sitting on the side of a flat bed without using  bedrails?: Total Help needed moving to and from a bed to a chair (including a wheelchair)?: Total Help needed standing up from a chair using your arms (e.g., wheelchair or bedside chair)?: Total Help needed to walk in hospital room?: Total Help needed climbing 3-5 steps with a railing? : Total 6 Click Score: 7    End of Session Equipment Utilized During Treatment: Gait belt;Cervical collar Activity Tolerance: Patient tolerated treatment well Patient left: in bed;with call bell/phone within reach;with bed alarm set;with nursing/sitter in room (Nurse present with pt finishing with pt care (nurse to finish setting pt up once finished)) Nurse Communication: Mobility status;Precautions PT Visit Diagnosis: Unsteadiness on feet (R26.81);Muscle weakness (generalized) (M62.81);Other abnormalities of gait and mobility (R26.89);Difficulty in walking, not elsewhere classified (R26.2);Other symptoms and signs involving the nervous system (R29.898)     Time: 2536-6440 PT Time Calculation (min) (ACUTE ONLY): 38 min  Charges:    $Therapeutic Activity: 38-52 mins PT General Charges $$ ACUTE PT VISIT: 1 Visit  Amador Junes, PT 05/19/23, 2:09 PM

## 2023-05-20 DIAGNOSIS — S14151A Other incomplete lesion at C1 level of cervical spinal cord, initial encounter: Secondary | ICD-10-CM

## 2023-05-20 DIAGNOSIS — M4802 Spinal stenosis, cervical region: Secondary | ICD-10-CM | POA: Diagnosis not present

## 2023-05-20 DIAGNOSIS — G959 Disease of spinal cord, unspecified: Secondary | ICD-10-CM | POA: Diagnosis not present

## 2023-05-20 DIAGNOSIS — S14129A Central cord syndrome at unspecified level of cervical spinal cord, initial encounter: Secondary | ICD-10-CM | POA: Diagnosis not present

## 2023-05-20 DIAGNOSIS — S14109A Unspecified injury at unspecified level of cervical spinal cord, initial encounter: Secondary | ICD-10-CM | POA: Diagnosis not present

## 2023-05-20 LAB — BASIC METABOLIC PANEL WITH GFR
Anion gap: 7 (ref 5–15)
BUN: 18 mg/dL (ref 8–23)
CO2: 26 mmol/L (ref 22–32)
Calcium: 8.4 mg/dL — ABNORMAL LOW (ref 8.9–10.3)
Chloride: 107 mmol/L (ref 98–111)
Creatinine, Ser: 0.98 mg/dL (ref 0.61–1.24)
GFR, Estimated: >60 mL/min (ref >60)
Glucose, Bld: 108 mg/dL — ABNORMAL HIGH (ref 70–99)
Potassium: 3.8 mmol/L (ref 3.5–5.1)
Sodium: 140 mmol/L (ref 135–145)

## 2023-05-20 LAB — CBC
HCT: 33.1 % — ABNORMAL LOW (ref 39.0–52.0)
Hemoglobin: 10.9 g/dL — ABNORMAL LOW (ref 13.0–17.0)
MCH: 32.2 pg (ref 26.0–34.0)
MCHC: 32.9 g/dL (ref 30.0–36.0)
MCV: 97.6 fL (ref 80.0–100.0)
Platelets: 258 10*3/uL (ref 150–400)
RBC: 3.39 MIL/uL — ABNORMAL LOW (ref 4.22–5.81)
RDW: 12.8 % (ref 11.5–15.5)
WBC: 10.6 10*3/uL — ABNORMAL HIGH (ref 4.0–10.5)
nRBC: 0 % (ref 0.0–0.2)

## 2023-05-20 LAB — GLUCOSE, CAPILLARY
Glucose-Capillary: 101 mg/dL — ABNORMAL HIGH (ref 70–99)
Glucose-Capillary: 108 mg/dL — ABNORMAL HIGH (ref 70–99)
Glucose-Capillary: 138 mg/dL — ABNORMAL HIGH (ref 70–99)
Glucose-Capillary: 196 mg/dL — ABNORMAL HIGH (ref 70–99)

## 2023-05-20 NOTE — Progress Notes (Signed)
 Physical Therapy Treatment Patient Details Name: Billy Franco MRN: [id number] DOB: 1940-05-30 Today's Date: 05/20/2023   History of Present Illness Pt is an 83 y/o M admitted on 05/10/23 after presenting with c/o BUE & BLE weakness & numbness s/p fall. 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 Pt underwent C3-5 ACDF. Pt was intubated on 05/13/23, extubated on 05/15/23. PMH: DM2, CKD 3, HTN, HLD, HFrEF, refused AICD, ischemic cardiomyopathy, a-fib not on anticoagulation due to gross hematuria, CAD s/p PCI & stent, GERD, RLS, chronic back pain    PT Comments  PT/OT co-treat 2/2 to pt requiring +2 assistance for all safe function mobility and transfer. Pt require +2 max assist to achieve EOB sitting. Sat EOB x ~ 20 minutes total with varied amount of assist required. Several times ws able to maintain with close supervision however mostly requires min assist of one with vcs for righting. Pt is far from his baseline and will continue to benefit from skilled PT at DC to maximize independence and safety with all ADLs.    If plan is discharge home, recommend the following: Two people to help with walking and/or transfers;Two people to help with bathing/dressing/bathroom;Assistance with cooking/housework;Assist for transportation;Help with stairs or ramp for entrance;Assistance with feeding     Equipment Recommendations  [place] lift;Wheelchair cushion (measurements PT);Wheelchair (measurements PT);Hospital bed       Precautions / Restrictions Precautions Precautions: Fall;Cervical Precaution Booklet Issued: No Recall of Precautions/Restrictions: Intact Precaution/Restrictions Comments: Hard collar (remove in bed and for meals) elected to place while seated EOB Required Braces or Orthoses: Cervical Brace Cervical Brace: Hard collar     Mobility  Bed Mobility Overal bed mobility: Needs Assistance Bed  Mobility: Rolling, Sidelying to Sit, Sit to Sidelying Rolling: Max assist, +2 for safety/equipment, Used rails Sidelying to sit: Max assist, +2 for physical assistance, HOB elevated, Used rails  Sit to sidelying: Max assist, +2 for physical assistance, HOB elevated, Used rails   Transfers Overall transfer level: Needs assistance Equipment used: 2 person hand held assist Transfers: Sit to/from Stand Sit to Stand: Max assist, +2 physical assistance  General transfer comment: Pt was able to stand 1 x with total assist. pt puts forth great effort however unsafe to attempt further standing attempts. m,ost of session focused on sitting balance and coordination in sitting    Ambulation/Gait  General Gait Details: Not appropriate at this time     Balance Overall balance assessment: Needs assistance Sitting-balance support: Bilateral upper extremity supported, Feet supported Sitting balance-Leahy Scale: Poor     Standing balance support: Bilateral upper extremity supported, During functional activity, Reliant on assistive device for balance Standing balance-Leahy Scale: Zero      Communication Communication Communication: No apparent difficulties  Cognition Arousal: Alert Behavior During Therapy: WFL for tasks assessed/performed   PT - Cognitive impairments: Awareness, Sequencing, Problem solving, Safety/Judgement, Memory, Attention      PT - Cognition Comments: Impaired processing noted with activities. impulsive and needs constant vcs fo relaxation and slowing down . pt tends to perform all desired task radiply Following commands: Impaired Following commands impaired: Only follows one step commands consistently    Cueing Cueing Techniques: Verbal cues, Tactile cues         Pertinent Vitals/Pain Pain Assessment Pain Assessment: No/denies pain Pain Score: 0-No pain     PT Goals (current goals can now be found in the care plan section) Acute Rehab PT  Goals Patient Stated  Goal: to be able to walk Progress towards PT goals: Progressing toward goals    Frequency    Min 3X/week           Co-evaluation     PT goals addressed during session: Mobility/safety with mobility;Balance        AM-PAC PT "6 Clicks" Mobility   Outcome Measure  Help needed turning from your back to your side while in a flat bed without using bedrails?: A Lot Help needed moving from lying on your back to sitting on the side of a flat bed without using bedrails?: Total Help needed moving to and from a bed to a chair (including a wheelchair)?: Total Help needed standing up from a chair using your arms (e.g., wheelchair or bedside chair)?: Total Help needed to walk in hospital room?: Total Help needed climbing 3-5 steps with a railing? : Total 6 Click Score: 7    End of Session Equipment Utilized During Treatment: Gait belt;Cervical collar Activity Tolerance: Patient tolerated treatment well Patient left: in bed;with call bell/phone within reach;with bed alarm set;with nursing/sitter in room Nurse Communication: Mobility status;Precautions PT Visit Diagnosis: Unsteadiness on feet (R26.81);Muscle weakness (generalized) (M62.81);Other abnormalities of gait and mobility (R26.89);Difficulty in walking, not elsewhere classified (R26.2);Other symptoms and signs involving the nervous system (R29.898)     Time: 0981-1914 PT Time Calculation (min) (ACUTE ONLY): 30 min  Charges:    $Neuromuscular Re-education: 8-22 mins PT General Charges $$ ACUTE PT VISIT: 1 Visit                     Chester Costa PTA 05/20/23, 4:11 PM

## 2023-05-20 NOTE — Progress Notes (Signed)
 Inpatient Rehab Admissions Coordinator:   I spoke with pt, wife, and daughter at length about CIR and potential care needs following CIR. Dr. Roxan Copes note from yesterday was reviewed with them. Pt. Is likley to require heavy physical assist at d/c (mod-max A) and he would not be able to go to SNF after CIR due to insurance barriers. Insurance also does not pay for caregivers to come into the home at d/c. Pt.'s wife states that she is the only person who could be with the Pt. 24/7 and can only provide light assist. Her children all work. I do not think that CIR is an option, as family cannot provide the amount of care pt. Is likely to need at d/c. Family is in agreement to pursue SNF as it appears likely pt. Will ultimately need LTC. I will sign off.   Wandalee Gust, MS, CCC-SLP Rehab Admissions Coordinator  [phone] (celll) [phone] (office)

## 2023-05-20 NOTE — Inpatient Diabetes Management (Signed)
 Inpatient Diabetes Program Recommendations  AACE/ADA: New Consensus Statement on Inpatient Glycemic Control   Target Ranges:  Prepandial:   less than 140 mg/dL      Peak postprandial:   less than 180 mg/dL (1-2 hours)      Critically ill patients:  140 - 180 mg/dL    Latest Reference Range & Units 05/20/23 05:45  Glucose 70 - 99 mg/dL 161 (H)    Latest Reference Range & Units 05/19/23 08:17 05/19/23 12:08 05/19/23 12:35 05/19/23 16:26 05/19/23 20:47  Glucose-Capillary 70 - 99 mg/dL 81 62 (L) 92 096 (H) 045 (H)    Review of Glycemic Control  Diabetes history: DM2 Outpatient Diabetes medications: Glipizide  2.5 mg QAM, Metformin  XR 1500 mg QAM Current orders for Inpatient glycemic control: Semglee  5 units daily, Novolog  0-20 units TID with meals  Inpatient Diabetes Program Recommendations:    Insulin : Noted Semglee  decreased from 5 units BID to 5 units daily. Please consider decreasing Novolog  correction to 0-15 units TID with meals.  Thanks, Beacher Limerick, RN, MSN, CDCES Diabetes Coordinator Inpatient Diabetes Program [phone] (Team Pager from 8am to 5pm)

## 2023-05-20 NOTE — Plan of Care (Signed)
  Problem: Education: Goal: Ability to describe self-care measures that may prevent or decrease complications (Diabetes Survival Skills Education) will improve Outcome: Progressing Goal: Individualized Educational Video(s) Outcome: Progressing   Problem: Coping: Goal: Ability to adjust to condition or change in health will improve Outcome: Progressing   Problem: Fluid Volume: Goal: Ability to maintain a balanced intake and output will improve Outcome: Progressing   Problem: Health Behavior/Discharge Planning: Goal: Ability to identify and utilize available resources and services will improve Outcome: Progressing Goal: Ability to manage health-related needs will improve Outcome: Progressing   Problem: Metabolic: Goal: Ability to maintain appropriate glucose levels will improve Outcome: Progressing   Problem: Nutritional: Goal: Maintenance of adequate nutrition will improve Outcome: Progressing Goal: Progress toward achieving an optimal weight will improve Outcome: Progressing   Problem: Skin Integrity: Goal: Risk for impaired skin integrity will decrease Outcome: Progressing   Problem: Tissue Perfusion: Goal: Adequacy of tissue perfusion will improve Outcome: Progressing   Problem: Education: Goal: Knowledge of General Education information will improve Description: Including pain rating scale, medication(s)/side effects and non-pharmacologic comfort measures Outcome: Progressing   Problem: Health Behavior/Discharge Planning: Goal: Ability to manage health-related needs will improve Outcome: Progressing   Problem: Clinical Measurements: Goal: Ability to maintain clinical measurements within normal limits will improve Outcome: Progressing Goal: Will remain free from infection Outcome: Progressing Goal: Diagnostic test results will improve Outcome: Progressing Goal: Respiratory complications will improve Outcome: Progressing Goal: Cardiovascular complication will  be avoided Outcome: Progressing   Problem: Activity: Goal: Risk for activity intolerance will decrease Outcome: Progressing   Problem: Nutrition: Goal: Adequate nutrition will be maintained Outcome: Progressing   Problem: Coping: Goal: Level of anxiety will decrease Outcome: Progressing   Problem: Elimination: Goal: Will not experience complications related to bowel motility Outcome: Progressing Goal: Will not experience complications related to urinary retention Outcome: Progressing   Problem: Pain Managment: Goal: General experience of comfort will improve and/or be controlled Outcome: Progressing   Problem: Safety: Goal: Ability to remain free from injury will improve Outcome: Progressing   Problem: Skin Integrity: Goal: Risk for impaired skin integrity will decrease Outcome: Progressing   Problem: Education: Goal: Ability to verbalize activity precautions or restrictions will improve Outcome: Progressing Goal: Knowledge of the prescribed therapeutic regimen will improve Outcome: Progressing Goal: Understanding of discharge needs will improve Outcome: Progressing   Problem: Activity: Goal: Ability to avoid complications of mobility impairment will improve Outcome: Progressing Goal: Ability to tolerate increased activity will improve Outcome: Progressing Goal: Will remain free from falls Outcome: Progressing   Problem: Bowel/Gastric: Goal: Gastrointestinal status for postoperative course will improve Outcome: Progressing   Problem: Clinical Measurements: Goal: Ability to maintain clinical measurements within normal limits will improve Outcome: Progressing Goal: Postoperative complications will be avoided or minimized Outcome: Progressing Goal: Diagnostic test results will improve Outcome: Progressing   Problem: Pain Management: Goal: Pain level will decrease Outcome: Progressing   Problem: Skin Integrity: Goal: Will show signs of wound  healing Outcome: Progressing   Problem: Health Behavior/Discharge Planning: Goal: Identification of resources available to assist in meeting health care needs will improve Outcome: Progressing   Problem: Bladder/Genitourinary: Goal: Urinary functional status for postoperative course will improve Outcome: Progressing

## 2023-05-20 NOTE — Progress Notes (Signed)
 Occupational Therapy Treatment Patient Details Name: Billy Franco MRN: [id number] DOB: Sep 27, 1940 Today's Date: 05/20/2023   History of present illness Pt is an 83 y/o M admitted on 05/10/23 after presenting with c/o BUE & BLE weakness & numbness s/p fall. 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 Pt underwent C3-5 ACDF. Pt was intubated on 05/13/23, extubated on 05/15/23. PMH: DM2, CKD 3, HTN, HLD, HFrEF, refused AICD, ischemic cardiomyopathy, a-fib not on anticoagulation due to gross hematuria, CAD s/p PCI & stent, GERD, RLS, chronic back pain   OT comments  Pt motivated to participate in OT/PT co-tx. Impulsive, requires cues to slow down for optimal task performance. Session focused on improving core activation and sitting balance in prep for ADLs. Pt requires MAX A +2 for bed mobility and has narrow BOS once transitioned to seated EOB. Posterior/L lateral lean that requires MAX A to correct, can sustain balance with close supervision briefly. Pt and spouse will benefit from continued education on AE/DME for improving independence in ADLs. Discharge recommendation updated, patient will benefit from continued inpatient follow up therapy, <3 hours/day. OT will continue to follow.       If plan is discharge home, recommend the following:  A lot of help with bathing/dressing/bathroom;Assist for transportation;Assistance with cooking/housework;Help with stairs or ramp for entrance;Two people to help with walking and/or transfers   Equipment Recommendations  Hospital bed;Hoyer lift       Precautions / Restrictions Precautions Precautions: Fall;Cervical Precaution Booklet Issued: No Recall of Precautions/Restrictions: Intact Precaution/Restrictions Comments: Hard collar (remove in bed and for meals) elected to place while seated EOB Required Braces or Orthoses: Cervical Brace Cervical Brace: Hard  collar Restrictions Weight Bearing Restrictions Per Provider Order: No       Mobility Bed Mobility Overal bed mobility: Needs Assistance Bed Mobility: Rolling, Sidelying to Sit, Sit to Sidelying Rolling: Max assist, +2 for safety/equipment, Used rails Sidelying to sit: Max assist, +2 for physical assistance, HOB elevated, Used rails     Sit to sidelying: Max assist, +2 for physical assistance, HOB elevated, Used rails      Transfers Overall transfer level: Needs assistance Equipment used: 2 person hand held assist Transfers: Sit to/from Stand Sit to Stand: Max assist, +2 physical assistance                 Balance Overall balance assessment: Needs assistance Sitting-balance support: Bilateral upper extremity supported, Feet supported Sitting balance-Leahy Scale: Poor   Postural control: Posterior lean, Left lateral lean Standing balance support: Bilateral upper extremity supported, During functional activity, Reliant on assistive device for balance Standing balance-Leahy Scale: Zero                             ADL either performed or assessed with clinical judgement   ADL Overall ADL's : Needs assistance/impaired Eating/Feeding: Bed level;With adaptive utensils Eating/Feeding Details (indicate cue type and reason): edu on use of built-up foam for utensils, discussed with pt and spouse that he could use utensils with hand-over-hand assist                                         Communication Communication Communication: No apparent difficulties   Cognition Arousal: Alert Behavior During Therapy: WFL for tasks assessed/performed Cognition: Cognition impaired  Orientation impairments: Situation Awareness: Online awareness impaired   Attention impairment (select first level of impairment): Sustained attention                     Following commands: Impaired Following commands impaired: Only follows one step commands  consistently      Cueing   Cueing Techniques: Verbal cues, Tactile cues  Exercises Exercises: Other exercises Other Exercises Other Exercises: BUE elevated on pillows, edemea noted Other Exercises: static and dynamic sitting activities, pt with improved balance overall. able to reach across midline but poor GMC/FMC       General Comments Cervical collar donned, RN notified of foam padding placed on two areas on back due to potential skin breakdown    Pertinent Vitals/ Pain       Pain Assessment Pain Assessment: No/denies pain Pain Score: 0-No pain         Frequency  Min 3X/week        Progress Toward Goals  OT Goals(current goals can now be found in the care plan section)  Progress towards OT goals: Progressing toward goals  Acute Rehab OT Goals OT Goal Formulation: With patient/family Time For Goal Achievement: 05/31/23 Potential to Achieve Goals: Fair ADL Goals Pt Will Perform Eating: with set-up;sitting;with adaptive utensils Pt Will Perform Grooming: sitting;with supervision Pt Will Perform Lower Body Dressing: sit to/from stand;sitting/lateral leans;with min assist Pt Will Transfer to Toilet: with contact guard assist;stand pivot transfer Pt Will Perform Toileting - Clothing Manipulation and hygiene: with min assist;sitting/lateral leans;sit to/from stand;with adaptive equipment  Plan      Co-evaluation    PT/OT/SLP Co-Evaluation/Treatment: Yes Reason for Co-Treatment: Complexity of the patient's impairments (multi-system involvement);For patient/therapist safety;To address functional/ADL transfers PT goals addressed during session: Mobility/safety with mobility;Balance OT goals addressed during session: ADL's and self-care      AM-PAC OT "6 Clicks" Daily Activity     Outcome Measure   Help from another person eating meals?: A Lot Help from another person taking care of personal grooming?: A Lot Help from another person toileting, which includes using  toliet, bedpan, or urinal?: Total Help from another person bathing (including washing, rinsing, drying)?: A Lot Help from another person to put on and taking off regular upper body clothing?: A Lot Help from another person to put on and taking off regular lower body clothing?: A Lot 6 Click Score: 11    End of Session Equipment Utilized During Treatment: Cervical collar;Gait belt  OT Visit Diagnosis: Other abnormalities of gait and mobility (R26.89);Muscle weakness (generalized) (M62.81);Unsteadiness on feet (R26.81)   Activity Tolerance Patient tolerated treatment well   Patient Left in bed;with call bell/phone within reach;with bed alarm set;with family/visitor present   Nurse Communication Mobility status        Time: 1610-9604 OT Time Calculation (min): 40 min  Charges: OT General Charges $OT Visit: 1 Visit OT Treatments $Self Care/Home Management : 8-22 mins $Therapeutic Activity: 8-22 mins  Audrea Bolte L. Dashiel Bergquist, OTR/L  05/20/23, 4:31 PM

## 2023-05-20 NOTE — TOC Progression Note (Signed)
 Transition of Care [facility]) - Progression Note    Patient Details  Name: Billy Franco MRN: [id number] Date of Birth: 1940-11-22  Transition of Care [facility]) CM/SW Contact  Alexandra Ice, RN Phone Number: 05/20/2023, 4:34 PM  Clinical Narrative:    Met with patient and spouse at bedside, discussed discharge plan, SNF for rehab. They are agreeable, preferences are: [facility].        Expected Discharge Plan and Services                                               Social Determinants of Health (SDOH) Interventions SDOH Screenings   Food Insecurity: No Food Insecurity (05/11/2023)  Housing: Low Risk  (05/11/2023)  Transportation Needs: No Transportation Needs (05/11/2023)  Utilities: Not At Risk (05/11/2023)  Alcohol Screen: Low Risk  (01/24/2019)  Depression (PHQ2-9): Low Risk  (02/02/2023)  Financial Resource Strain: Low Risk  (01/24/2019)  Physical Activity: Sufficiently Active (01/24/2019)  Social Connections: Unknown (05/11/2023)  Stress: No Stress Concern Present (01/24/2019)  Tobacco Use: Low Risk  (03/02/2023)   Received from [facility]    Readmission Risk Interventions     No data to display

## 2023-05-20 NOTE — Plan of Care (Signed)
  Problem: Education: Goal: Ability to describe self-care measures that may prevent or decrease complications (Diabetes Survival Skills Education) will improve Outcome: Progressing   Problem: Coping: Goal: Ability to adjust to condition or change in health will improve Outcome: Progressing   Problem: Metabolic: Goal: Ability to maintain appropriate glucose levels will improve Outcome: Progressing   Problem: Tissue Perfusion: Goal: Adequacy of tissue perfusion will improve Outcome: Progressing   Problem: Education: Goal: Knowledge of General Education information will improve Description: Including pain rating scale, medication(s)/side effects and non-pharmacologic comfort measures Outcome: Progressing

## 2023-05-20 NOTE — Progress Notes (Signed)
 PROGRESS NOTE    Billy Franco  ZOX:[id number] DOB: 05-15-1940 DOA: 05/10/2023 PCP: Sowles, Krichna, MD  [id number]/[id number]-AA  LOS: 10 days   Brief hospital course:  83 y.o male with significant PMH of T2DM, CKD stage III, HTN, HLD, HFrEF with EF 25-30%, refused AICD, ischemic Cardiomyopathy, A-Fib not on anticoagulation due to gross hematuria, CAD s/p PCI and stent, RLS and chronic back pain who presented to the ED with chief complaints of bilateral upper and lower extremities weakness and numbness s/p fall.   4/20:admit with cervical spine injury following a traumatic fall 05/11/23- patient s/p surgery today.  Lovenox  to initiate in am per nsgy. I met with patient and family at bedside. He appears stable and is in good spirits   05/12/23- patient doing well after decomrpession.  Patient reports feelings of falling and moving of med, I repositioned patient and reviewed what I could with family but patient still complains of these symptoms,  reviewed this with neurosurgeon.  He remains on neosynephrine.     05/13/23- JP drain removed,  patient with confusion/aggitation overnight started on precedex . Developing worsening AKI , have reviewed meds and started D5-HcO3 @75cc /hr , may need to discuss with family regarding intubation /GOC 05/14/23- patient unable to get NGT for dobhoff today.  AKI is improved.  For awakening trial today.  05/15/23- patient for SBT today currently on 10/5 PS, mentation is improved following verbal communication, he's off levophed  now.  AKI improved. Patient liberated from MV. 05/16/23-patient improved being optimized for TRH transfer.  4/30: Hemodynamically stable and improving, CIR was declined-TOC to look for SNF.  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 Ok to remove when in bed and when eating (when able)  --completed dexamethasone  taper --CIR was declined-TOC  to look for SNF   #Chronic HFrEF/Ischemic cardiomyopathy:  EF 40-45%  --stable  #Chronic A-Fib not on anticoagulation due to gross hematuria --rate controlled   CAD s/p PCI and stent --cont ASA --resume plavix  on 5/5 --cont statin  #AKI on CKD Stage III Oral hydration now   #Diabetes Mellitus --reduce glargine from 5u BID to daily --ACHS and SSI   #Hypothyroidism -Continue home synthroid    #RLS -Continue Requip   Urinary retention.  Patient with failed voiding trial on 4/28 so Foley was replaced on 4/29. - Likely be discharged with Foley catheter in place with outpatient urology evaluation   DVT prophylaxis: Lovenox  SQ Code Status: Full code  Family Communication: wife updated at bedside  Level of care: Med-Surg Dispo:   The patient is from: home Anticipated d/c is to: SNF Anticipated d/c date is: whenever bed available   Subjective and Interval History:  Patient was seen and examined today.  Still feeling weakness in hands but stating it is slowly improving.   Objective: Vitals:   05/20/23 0410 05/20/23 0500 05/20/23 0823 05/20/23 1559  BP: (!) 108/52  (!) 105/58 (!) 114/54  Pulse: 69  65 81  Resp: 17  16 16   Temp: 97.6 F (36.4 C)  97.7 F (36.5 C) 97.6 F (36.4 C)  TempSrc: Oral  Oral   SpO2: 100%  97% 100%  Weight:  84.2 kg    Height:        Intake/Output Summary (Last 24 hours) at 05/20/2023 1615 Last data filed at 05/20/2023 1046 Gross per 24 hour  Intake --  Output 900 ml  Net -900 ml  Filed Weights   05/18/23 0500 05/19/23 0500 05/20/23 0500  Weight: 72 kg 84 kg 84.2 kg    Examination:  General.  Frail elderly man, in no acute distress. Pulmonary.  Lungs clear bilaterally, normal respiratory effort. CV.  Regular rate and rhythm, no JVD, rub or murmur. Abdomen.  Soft, nontender, nondistended, BS positive. CNS.  Alert and oriented .  No new focal neurologic deficit. Extremities.  No edema, no cyanosis, pulses intact and  symmetrical.  Data Reviewed: I have personally reviewed labs and imaging studies  Time spent: 45 minutes  Luna Salinas, MD [facility] If 7PM-7AM, please contact night-coverage 05/20/2023, 4:15 PM

## 2023-05-21 DIAGNOSIS — S14129A Central cord syndrome at unspecified level of cervical spinal cord, initial encounter: Secondary | ICD-10-CM | POA: Diagnosis not present

## 2023-05-21 DIAGNOSIS — G959 Disease of spinal cord, unspecified: Secondary | ICD-10-CM | POA: Diagnosis not present

## 2023-05-21 DIAGNOSIS — M4802 Spinal stenosis, cervical region: Secondary | ICD-10-CM | POA: Diagnosis not present

## 2023-05-21 DIAGNOSIS — S14109A Unspecified injury at unspecified level of cervical spinal cord, initial encounter: Secondary | ICD-10-CM | POA: Diagnosis not present

## 2023-05-21 LAB — GLUCOSE, CAPILLARY
Glucose-Capillary: 117 mg/dL — ABNORMAL HIGH (ref 70–99)
Glucose-Capillary: 83 mg/dL (ref 70–99)
Glucose-Capillary: 126 mg/dL — ABNORMAL HIGH (ref 70–99)
Glucose-Capillary: 124 mg/dL — ABNORMAL HIGH (ref 70–99)

## 2023-05-21 NOTE — Plan of Care (Signed)
   Problem: Education: Goal: Ability to describe self-care measures that may prevent or decrease complications (Diabetes Survival Skills Education) will improve Outcome: Progressing

## 2023-05-21 NOTE — Progress Notes (Signed)
 Physical Therapy Treatment Patient Details Name: Billy Franco MRN: [id number] DOB: [birth year] Today's Date: 05/21/2023   History of Present Illness Pt is an 83 y/o M admitted on 05/10/23 after presenting with c/o BUE & BLE weakness & numbness s/p fall. 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 Pt underwent C3-5 ACDF. Pt was intubated on 05/13/23, extubated on 05/15/23. PMH: DM2, CKD 3, HTN, HLD, HFrEF, refused AICD, ischemic cardiomyopathy, a-fib not on anticoagulation due to gross hematuria, CAD s/p PCI & stent, GERD, RLS, chronic back pain    PT Comments  Pt was long sitting in bed with supportive spouse at bedside. Pt remains very pleasant and cooperative however continues to present with poor insight of overall situation. He requires extensive max assist of one to achieve EOB sitting. Pt remains inconsistent with sitting balance with occasional assistance required to prevent falling followed by episodes of sitting with supervision only. Pt is unsafe to attempt standing without +2 assistance. Educated pt on expectations but will need further reinforcement about prognosis and expectations moving forward. Acute PT will continue to follow and progress per current POC.    If plan is discharge home, recommend the following: Two people to help with walking and/or transfers;Two people to help with bathing/dressing/bathroom;Assistance with cooking/housework;Assist for transportation;Help with stairs or ramp for entrance;Assistance with feeding     Equipment Recommendations  Hoyer lift;Wheelchair cushion (measurements PT);Wheelchair (measurements PT);Hospital bed       Precautions / Restrictions Precautions Precautions: Fall;Cervical Precaution Booklet Issued: No Recall of Precautions/Restrictions: Intact Precaution/Restrictions Comments: Hard collar (remove in bed and for meals) elected to place while  seated EOB Required Braces or Orthoses: Cervical Brace Cervical Brace: Hard collar     Mobility  Bed Mobility Overal bed mobility: Needs Assistance Bed Mobility: Rolling, Sidelying to Sit, Sit to Sidelying Rolling: Max assist Sidelying to sit: Max assist Sit to sidelying: Max assist General bed mobility comments: Max assist to roll L/R to short sit.    Transfers  General transfer comment: unsafe to attempt this date due to only having     Balance Overall balance assessment: Needs assistance Sitting-balance support: Bilateral upper extremity supported, Feet supported Sitting balance-Leahy Scale: Fair Sitting balance - Comments: fluctuating between min assist to supervision      Communication Communication Communication: No apparent difficulties  Cognition Arousal: Alert Behavior During Therapy: WFL for tasks assessed/performed   PT - Cognitive impairments: No apparent impairments      PT - Cognition Comments: Pt is A but still lacks overall insight of situation and physical limitations. pt asked if he would be walking by a few weeks several times during session. Chartered loss adjuster educated pt on current presentation and unlikelihood of this happening. Following commands: Impaired Following commands impaired: Only follows one step commands consistently    Cueing Cueing Techniques: Verbal cues, Tactile cues     General Comments General comments (skin integrity, edema, etc.): Most of session focused on BUE/BLE strengthening, ROM, and improving coordination. pt needs constant vcs for slowing down and being more intentional with movements. Encouraged PRAFOs be placed back on prior to pt going to sleep for the night.      Pertinent Vitals/Pain Pain Assessment Pain Assessment: No/denies pain Pain Score: 0-No pain     PT Goals (current goals can now be found in the care plan section) Acute Rehab PT Goals Patient Stated Goal: to be able to walk Progress towards PT  goals: Not progressing  toward goals - comment    Frequency    Min 3X/week       Co-evaluation     PT goals addressed during session: Mobility/safety with mobility;Balance;Proper use of DME;Strengthening/ROM        AM-PAC PT "6 Clicks" Mobility   Outcome Measure  Help needed turning from your back to your side while in a flat bed without using bedrails?: Total Help needed moving from lying on your back to sitting on the side of a flat bed without using bedrails?: A Lot Help needed moving to and from a bed to a chair (including a wheelchair)?: A Lot Help needed standing up from a chair using your arms (e.g., wheelchair or bedside chair)?: Total Help needed to walk in hospital room?: Total Help needed climbing 3-5 steps with a railing? : Total 6 Click Score: 8    End of Session Equipment Utilized During Treatment: Cervical collar Activity Tolerance: Patient tolerated treatment well;Patient limited by fatigue Patient left: in bed;with call bell/phone within reach;with bed alarm set;with nursing/sitter in room Nurse Communication: Mobility status;Precautions PT Visit Diagnosis: Unsteadiness on feet (R26.81);Muscle weakness (generalized) (M62.81);Other abnormalities of gait and mobility (R26.89);Difficulty in walking, not elsewhere classified (R26.2);Other symptoms and signs involving the nervous system (R29.898)     Time: 1610-9604 PT Time Calculation (min) (ACUTE ONLY): 23 min  Charges:    $Therapeutic Exercise: 8-22 mins $Therapeutic Activity: 8-22 mins PT General Charges $$ ACUTE PT VISIT: 1 Visit                    Chester Costa PTA 05/21/23, 4:20 PM

## 2023-05-21 NOTE — Progress Notes (Addendum)
 Speech Language Pathology Treatment: Dysphagia  Patient Details Name: Billy Franco MRN: [id number] DOB: 05/18/1940 Today's Date: 05/21/2023 Time: 0920-0940 SLP Time Calculation (min) (ACUTE ONLY): 20 min  Assessment / Plan / Recommendation Clinical Impression  Pt seen for skilled intervention targeting dysphagia, specifically advanced solids trials and education for recommendations. Nursing denied issue with PO intake thus far this shift. Pt remains on room air, with slight dry cough noted intermittently- not timed with swallow. Total assist provided for set up and feeding. Pt seen with trials of regular solids and thin liquids via straw. No s/sx of aspiration. Pt denied altered sensation/discomfort with swallow completion. Min increased time noted for mastication in the setting of reduced dentition and endurance.  Education shared for diet options and aspiration precautions, with pt expressing desire for increased regular textures. Therapist emphasizing selection of easy to chew options from regular diet for energy conservation and safety for intake. Pt and spouse endorsed understanding.   Recommend regular solids and thin liquids with continued aspiration precautions, slow rate, small bites, elevated HOB, and alert for PO intake. Supervision/assistance for PO intake. Medications one at a time with thin liquids as able.   Given completion of education and gross safety with PO intake- no further dysphagia intervention indicated at this time.    HPI HPI: Per MD progress note "83 y.o male with significant PMH of  T2DM, CKD stage III, HTN, HLD, HFrEF with EF 25-30%, refused AICD, ischemic Cardiomyopathy, A-Fib  not on anticoagulation due to gross hematuria, CAD s/p PCI and stent, GERD, RLS and chronic back pain who presented to the ED with chief complaints of bilateral upper and lower extremities weakness and numbness s/p fall. Per family at the bedside, patient fell out of a folding chair on Friday  and landed on his left side. Following the fall, he reported generalized weakness, numbness and difficulties grasping objects with his hands. He reported hitting his head without LOC." Neurosurgery completing ACDF on 4/21.CT Head, 05/13/23: No acute intracranial abnormality. No acute fracture or static subluxation of the cervical spine. 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 CT Chest 05/13/23: "Small left and small to moderate right pleural effusions. Mild dependent atelectasis in the lungs. Mild debris or secretions in the distal trachea and right bronchus." Pt intubated from 4/23-4/25.      SLP Plan  All goals met      Recommendations for follow up therapy are one component of a multi-disciplinary discharge planning process, led by the attending physician.  Recommendations may be updated based on patient status, additional functional criteria and insurance authorization.    Recommendations  Diet recommendations: Regular;Thin liquid Liquids provided via: Straw;Cup Medication Administration: Whole meds with liquid (one at a time) Supervision: Full supervision/cueing for compensatory strategies;Staff to assist with self feeding Compensations: Minimize environmental distractions;Slow rate;Small sips/bites Postural Changes and/or Swallow Maneuvers: Seated upright 90 degrees;Upright 30-60 min after meal                  Oral care BID;Staff/trained caregiver to provide oral care   Frequent or constant Supervision/Assistance Dysphagia, unspecified (R13.10)     All goals met    [place] Can Lucci Clapp, MS, CCC-SLP Speech Language Pathologist Rehab Services; [facility] [phone] (ascom)   [place] J Clapp  05/21/2023, 11:30 AM

## 2023-05-21 NOTE — Progress Notes (Signed)
 PROGRESS NOTE    Billy Franco  ZOX:[id number] DOB: May 19, 1940 DOA: 05/10/2023 PCP: Sowles, Krichna, MD  149A/149A-AA  LOS: 11 days   Brief hospital course:  83 y.o male with significant PMH of T2DM, CKD stage III, HTN, HLD, HFrEF with EF 25-30%, refused AICD, ischemic Cardiomyopathy, A-Fib not on anticoagulation due to gross hematuria, CAD s/p PCI and stent, RLS and chronic back pain who presented to the ED with chief complaints of bilateral upper and lower extremities weakness and numbness s/p fall.   4/20:admit with cervical spine injury following a traumatic fall 05/11/23- patient s/p surgery today.  Lovenox  to initiate in am per nsgy. I met with patient and family at bedside. He appears stable and is in good spirits   05/12/23- patient doing well after decomrpession.  Patient reports feelings of falling and moving of med, I repositioned patient and reviewed what I could with family but patient still complains of these symptoms,  reviewed this with neurosurgeon.  He remains on neosynephrine.     05/13/23- JP drain removed,  patient with confusion/aggitation overnight started on precedex . Developing worsening AKI , have reviewed meds and started D5-HcO3 @75cc /hr , may need to discuss with family regarding intubation /GOC 05/14/23- patient unable to get NGT for dobhoff today.  AKI is improved.  For awakening trial today.  05/15/23- patient for SBT today currently on 10/5 PS, mentation is improved following verbal communication, he's off levophed  now.  AKI improved. Patient liberated from MV. 05/16/23-patient improved being optimized for TRH transfer.  4/30: Hemodynamically stable and improving, CIR was declined-TOC to look for SNF.  5/1: Remained hemodynamically stable, improving weakness.  Awaiting SNF placement.  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 Ok to remove when in bed  and when eating (when able)  --completed dexamethasone  taper --CIR was declined-TOC to look for SNF   #Chronic HFrEF/Ischemic cardiomyopathy:  EF 40-45%  --stable  #Chronic A-Fib not on anticoagulation due to gross hematuria --rate controlled   CAD s/p PCI and stent --cont ASA --resume plavix  on 5/5 --cont statin  #AKI on CKD Stage III Oral hydration now   #Diabetes Mellitus --reduce glargine from 5u BID to daily --ACHS and SSI   #Hypothyroidism -Continue home synthroid    #RLS -Continue Requip   Urinary retention.  Patient with failed voiding trial on 4/28 so Foley was replaced on 4/29. - Likely be discharged with Foley catheter in place with outpatient urology evaluation   DVT prophylaxis: Lovenox  SQ Code Status: Full code  Family Communication:  Level of care: Med-Surg Dispo:   The patient is from: home Anticipated d/c is to: SNF Anticipated d/c date is: whenever bed available   Subjective and Interval History:  Patient was resting comfortably when seen today.  Stating that his hand weakness is improving.  No new concern.  Objective: Vitals:   05/20/23 2010 05/21/23 0516 05/21/23 0734 05/21/23 0828  BP: (!) 105/44 (!) 112/59  (!) 124/44  Pulse: 72 66  66  Resp: 18 18  16   Temp: 98.4 F (36.9 C) 98 F (36.7 C)  98 F (36.7 C)  TempSrc:      SpO2: 100% 99%  100%  Weight:   84.2 kg   Height:        Intake/Output Summary (Last 24 hours) at 05/21/2023 1443 Last data filed at 05/21/2023 0538 Gross per 24 hour  Intake --  Output 1500  ml  Net -1500 ml   Filed Weights   05/19/23 0500 05/20/23 0500 05/21/23 0734  Weight: 84 kg 84.2 kg 84.2 kg    Examination:  General.  Frail elderly man, in no acute distress. Pulmonary.  Lungs clear bilaterally, normal respiratory effort. CV.  Regular rate and rhythm, no JVD, rub or murmur. Abdomen.  Soft, nontender, nondistended, BS positive. CNS.  Alert and oriented .  No focal neurologic deficit. Extremities.  No  edema, no cyanosis, pulses intact and symmetrical.  Data Reviewed: I have personally reviewed labs and imaging studies  Time spent: 44 minutes  Luna Salinas, MD [facility] If 7PM-7AM, please contact night-coverage 05/21/2023, 2:43 PM

## 2023-05-21 NOTE — Progress Notes (Signed)
 Nutrition Follow-up  DOCUMENTATION CODES:   Not applicable  INTERVENTION:   -D/c Ensure Enlive -Continue MVI with minerals daily -Continue daily weights -Magic cup TID with meals, each supplement provides 290 kcal and 9 grams of protein   NUTRITION DIAGNOSIS:   Inadequate oral intake related to inability to eat (pt sedated and ventilated) as evidenced by NPO status.  Progressing; advanced to PO diet on 05/16/23  GOAL:   Patient will meet greater than or equal to 90% of their needs  Progressing   MONITOR:   PO intake, Supplement acceptance, Labs, Weight trends, Skin, I & O's  REASON FOR ASSESSMENT:   Ventilator    ASSESSMENT:   83 y/o male with h/o GERD, chronic back pain, DM, HTN, Afib, RLS, CAD s/p PCI and stent, CKD III, CHF with EF 25-30%, refused AICD, ischemic cardiomyopathy and gout who is admitted with cervical stenosis and myelopathy after fall s/p C3-5 ACDF 4/21 complicated by acute metabolic encephalopathy with acute hypoxic respiratory failure due to presumed aspiration requiring intubation and mechanical ventilation for airway protection.  4/25- extubated 4/26- s/p BSE- dysphagia 2 diet with thin liquids  Reviewed I/O's: -1.6 L x 24 hours and -1.3 L since admission  UOP: 1.6 L x 24 hours   Pt working with therapy at time of visit.   Pt remains of dysphagia 2 diet. Noted meal completions 20-80%. Pt is refusing Ensure supplements.   Noted wt gain since admission.   Per TOC notes, plan for SNF placement at discharge.   Medications reviewed and include lovenox , synthroid , protonix , senokot, and aldactone .   Labs reviewed: CBGS: 92-153 (inpatient orders for glycemic control are 0-20 units insulin  aspart TID with meals, and 5 units insulin  glargine-yfgn daily).    Diet Order:   Diet Order             DIET DYS 2 Room service appropriate? Yes; Fluid consistency: Thin  Diet effective now                   EDUCATION NEEDS:   No education needs  have been identified at this time  Skin:  Skin Assessment: Skin Integrity Issues: Skin Integrity Issues:: Incisions Incisions: neck, lt vertebral column  Last BM:  05/21/23 (type 6)  Height:   Ht Readings from Last 1 Encounters:  05/10/23 5\' 7"  (1.702 m)    Weight:   Wt Readings from Last 1 Encounters:  05/21/23 84.2 kg    Ideal Body Weight:  67.2 kg  BMI:  Body mass index is 29.07 kg/m.  Estimated Nutritional Needs:   Kcal:  1700-2000kcal/day  Protein:  85-100g/day  Fluid:  1.7-2.0L/day    Herschel Lords, RD, LDN, CDCES Registered Dietitian III Certified Diabetes Care and Education Specialist If unable to reach this RD, please use "RD Inpatient" group chat on secure chat between hours of 8am-4 pm daily

## 2023-05-21 NOTE — TOC Progression Note (Signed)
 Transition of Care [facility]) - Progression Note    Patient Details  Name: Billy Franco MRN: [id number] Date of Birth: Jul 06, 1940  Transition of Care [facility]) CM/SW Contact  Alexandra Ice, RN Phone Number: 05/21/2023, 4:10 PM  Clinical Narrative:   Contacted wife, [name], discussed bed offers, chose [facility]. Will initiate auth with [employer].        Expected Discharge Plan and Services                                               Social Determinants of Health (SDOH) Interventions SDOH Screenings   Food Insecurity: No Food Insecurity (05/11/2023)  Housing: Low Risk  (05/11/2023)  Transportation Needs: No Transportation Needs (05/11/2023)  Utilities: Not At Risk (05/11/2023)  Alcohol Screen: Low Risk  (01/24/2019)  Depression (PHQ2-9): Low Risk  (02/02/2023)  Financial Resource Strain: Low Risk  (01/24/2019)  Physical Activity: Sufficiently Active (01/24/2019)  Social Connections: Unknown (05/11/2023)  Stress: No Stress Concern Present (01/24/2019)  Tobacco Use: Low Risk  (03/02/2023)   Received from [facility]    Readmission Risk Interventions     No data to display
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
## 2023-05-21 NOTE — TOC Progression Note (Signed)
 Transition of Care [facility]) - Progression Note    Patient Details  Name: Billy Franco MRN: [id number] Date of Birth: Sep 23, 1940  Transition of [facility]) CM/SW Contact  Alexandra Ice, RN Phone Number: 05/21/2023, 1:58 PM  Clinical Narrative:     [place] [facility], and bedsearch completed. Patient pending bed offers.        Expected Discharge Plan and Services                                               Social Determinants of Health (SDOH) Interventions SDOH Screenings   Food Insecurity: No Food Insecurity (05/11/2023)  Housing: Low Risk  (05/11/2023)  Transportation Needs: No Transportation Needs (05/11/2023)  Utilities: Not At Risk (05/11/2023)  Alcohol Screen: Low Risk  (01/24/2019)  Depression (PHQ2-9): Low Risk  (02/02/2023)  Financial Resource Strain: Low Risk  (01/24/2019)  Physical Activity: Sufficiently Active (01/24/2019)  Social Connections: Unknown (05/11/2023)  Stress: No Stress Concern Present (01/24/2019)  Tobacco Use: Low Risk  (03/02/2023)   Received from [facility]    Readmission Risk Interventions     No data to display

## 2023-05-22 LAB — CBC
HCT: 31.6 % — ABNORMAL LOW (ref 39.0–52.0)
Hemoglobin: 10.4 g/dL — ABNORMAL LOW (ref 13.0–17.0)
MCH: 32.3 pg (ref 26.0–34.0)
MCHC: 32.9 g/dL (ref 30.0–36.0)
MCV: 98.1 fL (ref 80.0–100.0)
Platelets: 283 10*3/uL (ref 150–400)
RBC: 3.22 MIL/uL — ABNORMAL LOW (ref 4.22–5.81)
RDW: 13.2 % (ref 11.5–15.5)
WBC: 10.8 10*3/uL — ABNORMAL HIGH (ref 4.0–10.5)
nRBC: 0 % (ref 0.0–0.2)

## 2023-05-22 LAB — BASIC METABOLIC PANEL WITH GFR
Anion gap: 6 (ref 5–15)
BUN: 13 mg/dL (ref 8–23)
CO2: 25 mmol/L (ref 22–32)
Calcium: 8.6 mg/dL — ABNORMAL LOW (ref 8.9–10.3)
Chloride: 106 mmol/L (ref 98–111)
Creatinine, Ser: 0.96 mg/dL (ref 0.61–1.24)
GFR, Estimated: >60 mL/min (ref >60)
Glucose, Bld: 111 mg/dL — ABNORMAL HIGH (ref 70–99)
Potassium: 4 mmol/L (ref 3.5–5.1)
Sodium: 137 mmol/L (ref 135–145)

## 2023-05-22 LAB — GLUCOSE, CAPILLARY
Glucose-Capillary: 115 mg/dL — ABNORMAL HIGH (ref 70–99)
Glucose-Capillary: 181 mg/dL — ABNORMAL HIGH (ref 70–99)
Glucose-Capillary: 89 mg/dL (ref 70–99)

## 2023-05-22 MED ORDER — ADULT MULTIVITAMIN W/MINERALS CH: 1.0000 | ORAL_TABLET | Freq: Every day | ORAL | Status: DC

## 2023-05-22 MED ORDER — SENNA 8.6 MG PO TABS: 1.0000 | ORAL_TABLET | Freq: Two times a day (BID) | ORAL | 0 refills | Status: DC

## 2023-05-22 MED ORDER — ASPIRIN 81 MG PO TBEC: 81.0000 mg | DELAYED_RELEASE_TABLET | Freq: Every day | ORAL | Status: DC

## 2023-05-22 MED ORDER — ONDANSETRON HCL 4 MG PO TABS: 4.0000 mg | ORAL_TABLET | Freq: Four times a day (QID) | ORAL | Status: DC | PRN

## 2023-05-22 MED ORDER — CLOPIDOGREL BISULFATE 75 MG PO TABS: 75.0000 mg | ORAL_TABLET | Freq: Every day | ORAL | Status: DC

## 2023-05-22 NOTE — TOC Transition Note (Signed)
 Transition of Care [facility]) - Discharge Note   Patient Details  Name: Billy Franco MRN: [id number] Date of Birth: August 15, 1940  [facility]) CM/SW Contact:  Alexandra Ice, RN Phone Number: 05/22/2023, 12:58 PM   Clinical Narrative:     Patient received approval from [facility] for [facility]. Per Bonnell Butcher at [facility], able to accept after 4pm today. Sent discharge summary and order via HUB. Spoke with Lydia at [facility], set up transportation, #6 on the list to transport. Notified bedside nurse patient going to room 607A, provided number to call for report. Let her know patient will go by [facility]. EMS packet printed to nurse station.   Final next level of care: Skilled Nursing Facility Barriers to Discharge: Barriers Resolved   Patient Goals and CMS Choice   CMS Medicare.gov Compare Post Acute Care list provided to:: Patient Represenative (must comment) (Spouse, [name]) Choice offered to / list presented to : Patient, Spouse      Discharge Placement              Patient chooses bed at: [facility] [place] Patient to be transferred to facility by: [facility] Name of family member notified: [name] Hudlow Patient and family notified of of transfer: 05/22/23  Discharge Plan and Services Additional resources added to the After Visit Summary for                    DME Agency: NA       HH Arranged: NA          Social Drivers of Health (SDOH) Interventions SDOH Screenings   Food Insecurity: No Food Insecurity (05/11/2023)  Housing: Low Risk  (05/11/2023)  Transportation Needs: No Transportation Needs (05/11/2023)  Utilities: Not At Risk (05/11/2023)  Alcohol Screen: Low Risk  (01/24/2019)  Depression (PHQ2-9): Low Risk  (02/02/2023)  Financial Resource Strain: Low Risk  (01/24/2019)  Physical Activity: Sufficiently Active (01/24/2019)  Social Connections: Unknown (05/11/2023)  Stress: No Stress Concern Present (01/24/2019)  Tobacco Use: Low Risk  (03/02/2023)    Received from [facility]     Readmission Risk Interventions     No data to display

## 2023-05-22 NOTE — Progress Notes (Signed)
 The Clinical research associate called [facility] and gave report to Lawyer Pride, Charity fundraiser. Pt has denied any pain or discomfort at this time. Will continue to monitor until discharge. Pt will be discharge with foley cath.

## 2023-05-22 NOTE — TOC Progression Note (Signed)
 Transition of Care [facility]) - Progression Note    Patient Details  Name: Billy Franco MRN: [id number] Date of Birth: 1940-06-08  Transition of Care [facility]) CM/SW Contact  Alexandra Ice, RN Phone Number: 05/22/2023, 12:41 PM  Clinical Narrative:    Patient received approval for SNF, [id number], for [facility]. MD completed discharge summary and orders, sent to facility via HUB. Sent message to Bonnell Butcher, notifying of her dc orders and summary in. Awaiting patient's bed assignment.         Expected Discharge Plan and Services         Expected Discharge Date: 05/22/23                                     Social Determinants of Health (SDOH) Interventions SDOH Screenings   Food Insecurity: No Food Insecurity (05/11/2023)  Housing: Low Risk  (05/11/2023)  Transportation Needs: No Transportation Needs (05/11/2023)  Utilities: Not At Risk (05/11/2023)  Alcohol Screen: Low Risk  (01/24/2019)  Depression (PHQ2-9): Low Risk  (02/02/2023)  Financial Resource Strain: Low Risk  (01/24/2019)  Physical Activity: Sufficiently Active (01/24/2019)  Social Connections: Unknown (05/11/2023)  Stress: No Stress Concern Present (01/24/2019)  Tobacco Use: Low Risk  (03/02/2023)   Received from [facility]    Readmission Risk Interventions     No data to display

## 2023-05-22 NOTE — Discharge Summary (Signed)
 Physician Discharge Summary   Patient: Billy Franco MRN: [id number] DOB: Mar 16, 1940  Admit date:     05/10/2023  Discharge date: 05/22/23  Discharge Physician: Luna Salinas   PCP: Sowles, Krichna, MD   Recommendations at discharge:  Please obtain CBC and BMP on follow-up Follow-up with neurosurgery Follow-up with primary care provider Follow-up with urology-patient is being discharged with Foley catheter in place due to failed voiding trial  Discharge Diagnoses: Principal Problem:   Spinal cord injury, cervical region [facility]) Active Problems:   Central cord syndrome (HCC)   Cervical spinal stenosis   Cervical myelopathy (HCC)   Myelomalacia (HCC)   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    4/20:admit with cervical spine injury following a traumatic fall 05/11/23- patient s/p surgery today.  Lovenox  to initiate in am per nsgy. I met with patient and family at bedside. He appears stable and is in good spirits   05/12/23- patient doing well after decomrpession.  Patient reports feelings of falling and moving of med, I repositioned patient and reviewed what I could with family but patient still complains of these symptoms,  reviewed this with neurosurgeon.  He remains on neosynephrine.     05/13/23- JP drain removed,  patient with confusion/aggitation overnight started on precedex . Developing worsening AKI , have reviewed meds and started D5-HcO3 @75cc /hr , may need to discuss with family regarding intubation /GOC 05/14/23- patient unable to get NGT for dobhoff  today.  AKI is improved.  For awakening trial today.  05/15/23- patient for SBT today currently on 10/5 PS, mentation is improved following verbal communication, he's off levophed  now.  AKI improved. Patient liberated from MV. 05/16/23-patient improved being optimized for TR[facility] transfer.   4/30: Hemodynamically stable and improving, CIR was declined-TOC to look for SNF.   5/1: Remained hemodynamically stable, improving weakness.  Awaiting SNF placement.  5/2: Remained stable, continued to improve.  Patient is being discharged to SNF for short-term rehab before returning home. He is also being discharged with Foley catheter in place due to failed voiding trial and need to see a urologist as outpatient-referral was provided.  Patient will continue on current medications and need to have a close follow-up with his providers for further assistance.   Assessment & Plan:   Cervical stenosis and myelopathy  Central Cord syndrome s/p C3-5 ACDF  --patient to wear cervical collar when OOB and ambulating. Ok to remove when in bed and when eating (when able)  --completed dexamethasone  taper --CIR was declined-TOC to look for SNF   #Chronic HFrEF/Ischemic cardiomyopathy:  EF 40-45%  --stable   #Chronic A-Fib not on anticoagulation due to gross hematuria --rate controlled   CAD s/p PCI and stent --cont ASA --resume plavix  on 5/5 --cont statin   #AKI on CKD Stage III Oral hydration now   #Diabetes Mellitus --reduce glargine from 5u BID to daily --ACHS and SSI   #Hypothyroidism -Continue home synthroid    #RLS -Continue Requip    Urinary retention.  Patient with failed voiding trial on 4/28 so Foley was replaced on 4/29. - Patient is being  discharged with Foley catheter in place with outpatient urology evaluation.     Consultants: PCCM.  Neurosurgery Procedures performed: Cervical spinal cord decompression Disposition: Skilled nursing facility Diet recommendation:  Discharge Diet  Orders (From admission, onward)     Start     Ordered   05/22/23 0000  Diet - low sodium heart healthy        05/22/23 1232           Cardiac and Carb modified diet DISCHARGE MEDICATION: Allergies as of 05/22/2023       Reactions   Codeine Rash, Other (See Comments)   Per pt "hard on my kidneys"    Lovastatin Rash, Other (See Comments)   Tape Rash        Medication List     STOP taking these medications    carvedilol  3.125 MG tablet Commonly known as: COREG    losartan  25 MG tablet Commonly known as: COZAAR    metaxalone 800 MG tablet Commonly known as: SKELAXIN       TAKE these medications    acetaminophen  650 MG CR tablet Commonly known as: TYLENOL  Take 650 mg by mouth every 8 (eight) hours as needed for pain.   aspirin  EC 81 MG tablet Take 1 tablet (81 mg total) by mouth daily. Swallow whole.   atorvastatin  10 MG tablet Commonly known as: LIPITOR Take 1 tablet (10 mg total) by mouth daily at 6 PM.   B-12 1000 MCG Tbcr Take 1,000 mcg by mouth every other day.   blood glucose meter kit and supplies 1 each by Other route as directed. Dispense based on patient and insurance preference. Use up to four times daily as directed. (FOR ICD-10 E10.9, E11.9).   clopidogrel  75 MG tablet Commonly known as: PLAVIX  Take 1 tablet (75 mg total) by mouth daily. Please resume from 05/25/2023 What changed: additional instructions   docusate sodium  50 MG capsule Commonly known as: COLACE Take 100 mg by mouth 2 (two) times daily as needed for mild constipation.   fexofenadine-pseudoephedrine 180-240 MG 24 hr tablet Commonly known as: ALLEGRA-D 24 Take 1 tablet by mouth daily as needed (allergies).   furosemide  40 MG tablet Commonly known as: LASIX  Take 40 mg by mouth daily.   Gaviscon 95-358 MG/15ML Susp Generic drug: aluminum hydroxide-magnesium  carbonate Take by mouth.   glipiZIDE  2.5 MG Tabs Take 1 tablet by mouth daily with breakfast.   levothyroxine  25  MCG tablet Commonly known as: SYNTHROID  TAKE 1 TABLET (25 MCG TOTAL) BY MOUTH DAILY BEFORE BREAKFAST. ON SUNDAYS, TAKE 2 TABLETS BY MOUTH BEFORE BREAKFAST.   magnesium  oxide 400 MG tablet Commonly known as: MAG-OX Take 400 mg by mouth daily.   metFORMIN  750 MG 24 hr tablet Commonly known as: GLUCOPHAGE -XR Take 2 tablets (1,500 mg total) by mouth daily with breakfast.   multivitamin with minerals Tabs tablet Take 1 tablet by mouth daily.   ondansetron  4 MG tablet Commonly known as: ZOFRAN  Take 1 tablet (4 mg total) by mouth every 6 (six) hours as needed for nausea or vomiting.   OneTouch Delica Plus Lancet33G Misc CHECK BLOOD SUGAR AT NOON   OneTouch Ultra test strip Generic drug: glucose blood USE AS DIRECTED TO CHECK BLOOD GLUCOSE ONCE DAILY.   pantoprazole  40 MG tablet Commonly known as: PROTONIX  Take 1 tablet (40 mg total) by mouth daily.   polyethylene glycol powder 17 GM/SCOOP powder Commonly known as: GLYCOLAX /MIRALAX  Take 17 g by mouth daily.   rOPINIRole  1 MG tablet Commonly known as:  REQUIP  Take 1 tablet (1 mg total) by mouth 2 (two) times daily.   senna 8.6 MG Tabs tablet Commonly known as: SENOKOT Take 1 tablet (8.6 mg total) by mouth 2 (two) times daily.   spironolactone  25 MG tablet Commonly known as: ALDACTONE  Take 12.5 mg by mouth daily.   tamsulosin  0.4 MG Caps capsule Commonly known as: FLOMAX  Take 1 capsule (0.4 mg total) by mouth every evening.   THERALITH XR PO Take by mouth QID. 3.752-45-45-49.5mg . Take 2 in the evening and 2 at bedtime.   triamcinolone  cream 0.1 % Commonly known as: KENALOG  Apply 1 application topically 2 (two) times daily as needed (rash).   Vitamin D -1000 Max St 25 MCG (1000 UT) tablet Generic drug: Cholecalciferol Take 1 tablet by mouth daily.        Contact information for follow-up providers     Callwood, Creig Doe D, MD. Go in 1 week(s).   Specialties: Cardiology, Internal Medicine Contact information: [address] [place] [address] [phone]         Noble Bateman, [place] Follow up on 05/26/2023.   Specialty: Neurosurgery Contact information: [address] [place] [address] [phone]         Arleen Lacer, MD. Schedule an appointment as soon as possible for a visit in 1 week(s).   Specialty: Family Medicine Contact information: [address] [place] [address] [phone]              Contact information for after-discharge care     Destination     [facility] Preferred SNF .   Service: Skilled Nursing Contact information: [address] [place]  [address] [phone]                    Discharge Exam: Billy Franco Weights   05/19/23 0500 05/20/23 0500 05/21/23 0734  Weight: 84 kg 84.2 kg 84.2 kg   General.  Frail elderly man, in no acute distress. Pulmonary.  Lungs clear bilaterally, normal respiratory effort. CV.  Regular rate and rhythm, no JVD, rub or murmur. Abdomen.  Soft, nontender, nondistended, BS positive. CNS.  Alert and oriented .  No focal neurologic deficit. Extremities.  No edema, no cyanosis, pulses intact and symmetrical.  Condition at discharge: stable  The results of significant diagnostics from this hospitalization (including imaging, microbiology, ancillary and laboratory) are listed below for reference.   Imaging Studies: ECHOCARDIOGRAM COMPLETE Result Date: 05/15/2023    ECHOCARDIOGRAM REPORT   Patient Name:   MAZEN PHO Date of Exam: 05/15/2023 Medical Rec #:  [id number]      Height:       67.0 in Accession #:    [id number]     Weight:       163.1 lb Date of Birth:  1940/03/23      BSA:          1.855 m Patient Age:    82 years       BP:           Not listed in chart/Not listed in                                              chart mmHg Patient Gender: M              HR:  42 bpm. Exam Location:  [facility] Procedure: 2D Echo,  Cardiac Doppler and Color Doppler (Both Spectral and Color            Flow Doppler were utilized during procedure). Indications:     Abnormal ECG R94.31  History:         Patient has prior history of Echocardiogram examinations, most                  recent 12/22/2019.  Sonographer:     Broadus Canes Referring Phys:  [id number] Delanna Fears Diagnosing Phys: Dwayne D Callwood MD IMPRESSIONS  1. Left ventricular ejection fraction, by estimation, is 25 to 30%. 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 The left ventricle demonstrates global hypokinesis. The left ventricular internal cavity size was mildly dilated. There is mild asymmetric left ventricular hypertrophy of the septal segment. Left ventricular diastolic parameters are consistent with Grade III diastolic dysfunction (restrictive).  2. Right ventricular systolic function is low normal. The right ventricular size is mildly enlarged.  3. The mitral valve is normal in structure. Mild to moderate mitral valve regurgitation.  4. The aortic valve is normal in structure. Aortic valve regurgitation is mild. FINDINGS  Left Ventricle: Left ventricular ejection fraction, by estimation, is 25 to 30%. 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 The left ventricle demonstrates global hypokinesis. Strain was performed and the global longitudinal strain is indeterminate. The left ventricular internal cavity size was mildly dilated. There is mild asymmetric left ventricular hypertrophy of the septal segment. Left ventricular diastolic parameters are consistent with Grade III diastolic dysfunction (restrictive). Right Ventricle: The right ventricular size is mildly enlarged. No increase in right ventricular wall thickness. Right ventricular systolic function is low normal. Left Atrium: Left atrial size was normal in size. Right Atrium: Right atrial size was normal in size. Pericardium: There is no evidence of pericardial effusion. Mitral Valve: The mitral valve  is normal in structure. Mild to moderate mitral valve regurgitation. Tricuspid Valve: The tricuspid valve is normal in structure. Tricuspid valve regurgitation is trivial. Aortic Valve: The aortic valve is normal in structure. Aortic valve regurgitation is mild. Aortic valve mean gradient measures 2.0 mmHg. Aortic valve peak gradient measures 4.4 mmHg. Aortic valve area, by VTI measures 2.13 cm. Pulmonic Valve: The pulmonic valve was normal in structure. Pulmonic valve regurgitation is not visualized. Aorta: The ascending aorta was not well visualized. IAS/Shunts: No atrial level shunt detected by color flow Doppler. Additional Comments: 3D was performed not requiring image post processing on an independent workstation and was indeterminate.  LEFT VENTRICLE PLAX 2D LVIDd:         4.90 cm      Diastology LVIDs:         4.50 cm      LV e' medial:    5.44 cm/s LV PW:         1.00 cm      LV E/e' medial:  17.2 LV IVS:        1.40 cm      LV e' lateral:   9.68 cm/s LVOT diam:     2.00 cm      LV E/e' lateral: 9.7 LV SV:         51 LV SV Index:   27 LVOT Area:     3.14 cm  LV Volumes (MOD) LV vol d, MOD A2C: 99.9 ml LV vol d, MOD A4C: 106.0 ml LV vol s, MOD A2C: 88.8 ml LV vol s,  MOD A4C: 78.3 ml LV SV MOD A2C:     11.1 ml LV SV MOD A4C:     106.0 ml LV SV MOD BP:      15.2 ml RIGHT VENTRICLE RV Basal diam:  4.70 cm RV Mid diam:    3.40 cm LEFT ATRIUM             Index        RIGHT ATRIUM           Index LA diam:        3.30 cm 1.78 cm/m   RA Area:     17.80 cm LA Vol (A2C):   42.3 ml 22.81 ml/m  RA Volume:   45.80 ml  24.70 ml/m LA Vol (A4C):   43.9 ml 23.67 ml/m LA Biplane Vol: 42.7 ml 23.02 ml/m  AORTIC VALVE AV Area (Vmax):    2.36 cm AV Area (Vmean):   1.88 cm AV Area (VTI):     2.13 cm AV Vmax:           105.00 cm/s AV Vmean:          68.900 cm/s AV VTI:            0.238 m AV Peak Grad:      4.4 mmHg AV Mean Grad:      2.0 mmHg LVOT Vmax:         79.00 cm/s LVOT Vmean:        41.200 cm/s LVOT VTI:           0.161 m LVOT/AV VTI ratio: 0.68  AORTA Ao Root diam: 3.20 cm MITRAL VALVE               TRICUSPID VALVE MV Area (PHT): 1.75 cm    TR Peak grad:   16.8 mmHg MV Decel Time: 434 msec    TR Vmax:        205.00 cm/s MV E velocity: 93.80 cm/s MV A velocity: 38.80 cm/s  SHUNTS MV E/A ratio:  2.42        Systemic VTI:  0.16 m                            Systemic Diam: 2.00 cm Antonette Batters MD Electronically signed by Antonette Batters MD Signature Date/Time: 05/15/2023/1:01:58 PM    Final    CT CHEST WO CONTRAST Result Date: 05/13/2023 CLINICAL DATA:  Spinal cord injury aspiration follow-up EXAM: CT CHEST WITHOUT CONTRAST TECHNIQUE: Multidetector CT imaging of the chest was performed following the standard protocol without IV contrast. RADIATION DOSE REDUCTION: This exam was performed according to the departmental dose-optimization program which includes automated exposure control, adjustment of the mA and/or kV according to patient size and/or use of iterative reconstruction technique. COMPARISON:  Radiograph 05/13/2023 FINDINGS: Cardiovascular: Limited evaluation without intravenous contrast. Mild aortic atherosclerosis. No aneurysm. Left upper extremity central venous catheter with tip at the cavoatrial junction. Coronary vascular calcification. Cardiomegaly. Trace pericardial effusion Mediastinum/Nodes: Endotracheal tube with tip several cm superior to the carina. Mild debris within the distal trachea and right bronchus. Multiple calcified lymph nodes consistent with prior granulomatous disease. Esophagus is unremarkable Lungs/Pleura: Trace left and small moderate right pleural effusions. Mild dependent atelectasis in the lower lobes. No pneumothorax Upper Abdomen: No acute finding. Left renal pelvis stone measuring about 21 mm. Musculoskeletal: Sternum appears intact. No acute osseous abnormality. IMPRESSION: 1. Small left and small to moderate right  pleural effusions. Mild dependent atelectasis in the lungs.  Mild debris or secretions in the distal trachea and right bronchus. 2. Cardiomegaly.  Trace pericardial effusion. 3. Evidence of prior granulomatous disease. 4. Left kidney stone Aortic Atherosclerosis (ICD10-I70.0). Electronically Signed   By: Esmeralda Hedge M.D.   On: 05/13/2023 23:16   CT CERVICAL SPINE WO CONTRAST Result Date: 05/13/2023 CLINICAL DATA:  Altered mental status EXAM: CT HEAD WITHOUT CONTRAST CT CERVICAL SPINE WITHOUT CONTRAST TECHNIQUE: Multidetector CT imaging of the head and cervical spine was performed following the standard protocol without intravenous contrast. Multiplanar CT image reconstructions of the cervical spine were also generated. RADIATION DOSE REDUCTION: This exam was performed according to the departmental dose-optimization program which includes automated exposure control, adjustment of the mA and/or kV according to patient size and/or use of iterative reconstruction technique. COMPARISON:  05/10/2023 FINDINGS: CT HEAD FINDINGS Brain: No mass,hemorrhage or extra-axial collection. Unchanged hypodensity within the right PCA territory. Vascular: No hyperdense vessel or unexpected vascular calcification. Skull: The visualized skull base, calvarium and extracranial soft tissues are normal. Sinuses/Orbits: No fluid levels or advanced mucosal thickening of the visualized paranasal sinuses. No mastoid or middle ear effusion. Normal orbits. Other: None. CT CERVICAL SPINE FINDINGS Alignment: No static subluxation. Facets are aligned. Occipital condyles are normally positioned. Skull base and vertebrae: C3-5 ACDF.  No acute fracture. Soft tissues and spinal canal: No prevertebral fluid or swelling. No visible canal hematoma. Disc levels: C2-3: Moderate spinal canal stenosis. C3-4: Severe spinal canal stenosis. C4-5: Mild spinal canal stenosis. C5-6: Unremarkable. C6-7: Mild spinal canal stenosis. Upper chest: No pneumothorax, pulmonary nodule or pleural effusion. Other: Normal visualized  paraspinal cervical soft tissues. IMPRESSION: 1. No acute intracranial abnormality. 2. No acute fracture or static subluxation of the cervical spine. 3. 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 Electronically Signed   By: Juanetta Nordmann M.D.   On: 05/13/2023 22:59   CT HEAD WO CONTRAST ( ) Result Date: 05/13/2023 CLINICAL DATA:  Altered mental status EXAM: CT HEAD WITHOUT CONTRAST CT CERVICAL SPINE WITHOUT CONTRAST TECHNIQUE: Multidetector CT imaging of the head and cervical spine was performed following the standard protocol without intravenous contrast. Multiplanar CT image reconstructions of the cervical spine were also generated. RADIATION DOSE REDUCTION: This exam was performed according to the departmental dose-optimization program which includes automated exposure control, adjustment of the mA and/or kV according to patient size and/or use of iterative reconstruction technique. COMPARISON:  05/10/2023 FINDINGS: CT HEAD FINDINGS Brain: No mass,hemorrhage or extra-axial collection. Unchanged hypodensity within the right PCA territory. Vascular: No hyperdense vessel or unexpected vascular calcification. Skull: The visualized skull base, calvarium and extracranial soft tissues are normal. Sinuses/Orbits: No fluid levels or advanced mucosal thickening of the visualized paranasal sinuses. No mastoid or middle ear effusion. Normal orbits. Other: None. CT CERVICAL SPINE FINDINGS Alignment: No static subluxation. Facets are aligned. Occipital condyles are normally positioned. Skull base and vertebrae: C3-5 ACDF.  No acute fracture. Soft tissues and spinal canal: No prevertebral fluid or swelling. No visible canal hematoma. Disc levels: C2-3: Moderate spinal canal stenosis. C3-4: Severe spinal canal stenosis. C4-5: Mild spinal canal stenosis. C5-6: Unremarkable. C6-7: Mild spinal canal stenosis. Upper chest: No pneumothorax, pulmonary nodule or pleural effusion. Other: Normal visualized paraspinal  cervical soft tissues. IMPRESSION: 1. No acute intracranial abnormality. 2. No acute fracture or static subluxation of the cervical spine. 3. 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 Electronically Signed   By: Philipp Brawn.D.  On: 05/13/2023 22:59   DG Chest Port 1 View Result Date: 05/13/2023 CLINICAL DATA:  Status post intubation EXAM: PORTABLE CHEST 1 VIEW COMPARISON:  May 10, 2023 FINDINGS: The heart size and mediastinal contours are within normal limits. Both lungs are clear. The visualized skeletal structures are unremarkable. Tip of the endotracheal tube 2 cm from carina Left PICC line tip in the SVC IMPRESSION: No active disease. Electronically Signed   By: Fredrich Jefferson M.D.   On: 05/13/2023 16:14   US  EKG SITE RITE Result Date: 05/12/2023 If [facility] image not attached, placement could not be confirmed due to current cardiac rhythm.  DG Cervical Spine 2-3 Views Result Date: 05/11/2023 CLINICAL DATA:  Elective surgery. EXAM: CERVICAL SPINE - 2-3 VIEW COMPARISON:  Preoperative imaging FINDINGS: Two fluoroscopic spot views of the cervical spine submitted from the operating room. Anterior fusion C3 through C5 with interbody spacers. Fluoroscopy time 4 seconds. Dose 0.29 mGy. IMPRESSION: Intraoperative fluoroscopy during cervical spine fusion. Electronically Signed   By: Chadwick Colonel M.D.   On: 05/11/2023 17:04   DG C-Arm 1-60 Min-No Report Result Date: 05/11/2023 Fluoroscopy was utilized by the requesting physician.  No radiographic interpretation.   DG C-Arm 1-60 Min-No Report Result Date: 05/11/2023 Fluoroscopy was utilized by the requesting physician.  No radiographic interpretation.   MR Cervical Spine Wo Contrast Result Date: 05/10/2023 CLINICAL DATA:  Ataxia, cervical trauma arm weakness, eval central cord EXAM: MRI CERVICAL SPINE WITHOUT CONTRAST TECHNIQUE: Multiplanar, multisequence MR imaging of the cervical spine was performed. No intravenous contrast was  administered. COMPARISON:  None Available. FINDINGS: Alignment: No substantial sagittal subluxation. Vertebrae: No bone marrow edema to suggest acute fracture. Edema in the anterior C6-C7 disc space with extension anteriorly and destruction of the anterior longitudinal ligament (see series 3, image 8). Adjacent prevertebral edema which extends inferiorly into the upper thoracic spine. Cord: T2 hyperintensity within the cord at C3-C4. Posterior Fossa, vertebral arteries, paraspinal tissues: The visualized 2 artery flow voids are maintained. Small volume of prevertebral edema in the lower cervical spine extending into the upper thoracic spine. Disc levels: C2-C3: Small posterior disc osteophyte complex. Mild canal stenosis. Bilateral facet uncovertebral hypertrophy with mild left foraminal stenosis. C3-C4: Posterior disc osteophyte complex and ligamentum flavum thickening. Resulting severe canal stenosis with cord compression. Bilateral facet uncovertebral hypertrophy. Resulting severe bilateral foraminal stenosis. C4-C5: Posterior disc osteophyte complex and ligamentum flavum thickening. Resulting moderate to severe canal stenosis. Bilateral facet and vertebral hypertrophy with severe bilateral foraminal stenosis. C5-C6: Segmentation anomaly. Partial fusion across the disc. Bilateral facet uncovertebral hypertrophy. Canal and foramina are patent. C6-C7: Posterior disc osteophyte complex with central disc protrusion. Bilateral facet and uncovertebral hypertrophy. Moderate bilateral foraminal stenosis. Mild to moderate canal stenosis. C7-T1: Uncovertebral hypertrophy without significant canal or foraminal stenosis. IMPRESSION: 1. 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 Edema extends posteriorly to involve the C6-C7 disc, compatible with traumatic disc injury. Small volume of adjacent prevertebral edema. 2. At C3-C4, severe canal stenosis with cord compression. T2 hyperintensity in the cord at this  level is compatible with cord edema and/or myelomalacia. Severe bilateral foraminal stenosis at this level. 3. At C4-C5, moderate to severe canal stenosis and severe bilateral foraminal stenosis. 4. At C6-C7, mild to moderate canal stenosis and moderate bilateral foraminal stenosis. Electronically Signed   By: Stevenson Elbe M.D.   On: 05/10/2023 20:41   MR BRAIN WO CONTRAST Result Date: 05/10/2023 CLINICAL DATA:  Neuro deficit, acute, stroke suspected left sided weakness EXAM:  MRI HEAD WITHOUT CONTRAST TECHNIQUE: Multiplanar, multiecho pulse sequences of the brain and surrounding structures were obtained without intravenous contrast. COMPARISON:  CT head and CT cervical spine from earlier today. FINDINGS: Brain: No acute infarction, hemorrhage, hydrocephalus, extra-axial collection or mass lesion. Remote right PCA territory infarct. Vascular: Major arterial flow voids are maintained at the skull base. Skull and upper cervical spine: Normal marrow signal. Sinuses/Orbits: Negative. IMPRESSION: 1. No evidence of acute intracranial abnormality. 2. Remote right PCA territory infarct. Electronically Signed   By: Stevenson Elbe M.D.   On: 05/10/2023 20:22   CT Thoracic Spine Wo Contrast Result Date: 05/10/2023 CLINICAL DATA:  Back trauma, fall from chair. EXAM: CT THORACIC AND LUMBAR SPINE WITHOUT CONTRAST TECHNIQUE: Multidetector CT imaging of the thoracic and lumbar spine was performed without contrast. Multiplanar CT image reconstructions were also generated. RADIATION DOSE REDUCTION: This exam was performed according to the departmental dose-optimization program which includes automated exposure control, adjustment of the mA and/or kV according to patient size and/or use of iterative reconstruction technique. COMPARISON:  10/08/2022, 04/28/2022 FINDINGS: CT THORACIC SPINE FINDINGS Alignment: Normal. Vertebrae: No acute fracture or focal pathologic process. Paraspinal and other soft tissues: No paraspinal  fat stranding or epidural hematoma. Aortic atherosclerosis is noted. There are multi-vessel coronary artery calcifications. Pleural and parenchymal scarring is noted at the lung apices. There are trace bilateral pleural effusions with atelectasis. Disc levels: Multilevel degenerative endplate changes. There is calcification of the supraspinous ligament. CT LUMBAR SPINE FINDINGS Segmentation: 5 lumbar type vertebrae. Alignment: Normal. Vertebrae: There is a old fracture of the L1, L2, and L3 transverse processes on the left with bone callus formation. No acute fracture is seen. Paraspinal and other soft tissues: No acute abnormality. Aortic atherosclerosis is seen. Left renal calculi are noted. Disc levels: Multi level degenerative endplate changes and facet arthropathy. There is a disc herniation at L4-L5 resulting in severe spinal canal stenosis and moderate to severe neural foraminal stenosis bilaterally, greater on the left than on the right. The spinal canal and neural foramina are otherwise patent. IMPRESSION: CT THORACIC SPINE IMPRESSION 1. Multilevel degenerative changes without evidence of acute fracture. 2. Trace bilateral pleural effusions with atelectasis. 3. Aortic atherosclerosis with coronary artery calcifications. CT LUMBAR SPINE IMPRESSION 1. 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 2. Multilevel degenerative changes in the lumbar spine as described above. 3. Left renal calculi. Electronically Signed   By: Wyvonnia Heimlich M.D.   On: 05/10/2023 20:01   CT Lumbar Spine Wo Contrast Result Date: 05/10/2023 CLINICAL DATA:  Back trauma, fall from chair. EXAM: CT THORACIC AND LUMBAR SPINE WITHOUT CONTRAST TECHNIQUE: Multidetector CT imaging of the thoracic and lumbar spine was performed without contrast. Multiplanar CT image reconstructions were also generated.  RADIATION DOSE REDUCTION: This exam was performed according to the departmental dose-optimization program which includes automated exposure control, adjustment of the mA and/or kV according to patient size and/or use of iterative reconstruction technique. COMPARISON:  10/08/2022, 04/28/2022 FINDINGS: CT THORACIC SPINE FINDINGS Alignment: Normal. Vertebrae: No acute fracture or focal pathologic process. Paraspinal and other soft tissues: No paraspinal fat stranding or epidural hematoma. Aortic atherosclerosis is noted. There are multi-vessel coronary artery calcifications. Pleural and parenchymal scarring is noted at the lung apices. There are trace bilateral pleural effusions with atelectasis. Disc levels: Multilevel degenerative endplate changes. There is calcification of the supraspinous ligament. CT LUMBAR SPINE FINDINGS Segmentation: 5  lumbar type vertebrae. Alignment: Normal. Vertebrae: There is a old fracture of the L1, L2, and L3 transverse processes on the left with bone callus formation. No acute fracture is seen. Paraspinal and other soft tissues: No acute abnormality. Aortic atherosclerosis is seen. Left renal calculi are noted. Disc levels: Multi level degenerative endplate changes and facet arthropathy. There is a disc herniation at L4-L5 resulting in severe spinal canal stenosis and moderate to severe neural foraminal stenosis bilaterally, greater on the left than on the right. The spinal canal and neural foramina are otherwise patent. IMPRESSION: CT THORACIC SPINE IMPRESSION 1. Multilevel degenerative changes without evidence of acute fracture. 2. Trace bilateral pleural effusions with atelectasis. 3. Aortic atherosclerosis with coronary artery calcifications. CT LUMBAR SPINE IMPRESSION 1. 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 2. Multilevel degenerative  changes in the lumbar spine as described above. 3. Left renal calculi. Electronically Signed   By: Wyvonnia Heimlich M.D.   On: 05/10/2023 20:01   CT Cervical Spine Wo Contrast Result Date: 05/10/2023 CLINICAL DATA:  Fall, weakness EXAM: CT CERVICAL SPINE WITHOUT CONTRAST TECHNIQUE: Multidetector CT imaging of the cervical spine was performed without intravenous contrast. Multiplanar CT image reconstructions were also generated. RADIATION DOSE REDUCTION: This exam was performed according to the departmental dose-optimization program which includes automated exposure control, adjustment of the mA and/or kV according to patient size and/or use of iterative reconstruction technique. COMPARISON:  None Available. FINDINGS: Alignment: No subluxation.  Facet alignment within normal limits. Skull base and vertebrae: No fracture Soft tissues and spinal canal: No prevertebral fluid or swelling. No visible canal hematoma. Disc levels: Prominent multilevel anterior osteophytes. Fusion of the C5-C6 vertebral bodies and posterior elements. Multilevel degenerative changes with osteophytes and mild disc space narrowing. Small central disc protrusion at C2-C3. At least mild canal stenosis. Posterior disc osteophyte complex at C3-C4 with moderate severe canal stenosis. Posterior disc osteophyte C4-C5 with at least moderate canal stenosis. Posterior disc osteophyte C6-C7 with at least mild canal stenosis and mild mass effect on the anterior thecal sac. Multilevel facet degenerative change. Multilevel foraminal narrowing. Upper chest: Apical scarring Other: None IMPRESSION: 1. Negative for acute fracture or subluxation of the cervical spine. 2. 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 Electronically Signed   By: Esmeralda Hedge M.D.   On: 05/10/2023 17:03   DG Chest Port 1 View Result Date: 05/10/2023 CLINICAL DATA:  Weakness EXAM: PORTABLE CHEST 1 VIEW COMPARISON:  12/21/2019  FINDINGS: Pleural-parenchymal scarring at the apices. No consolidation, pleural effusion or pneumothorax. Borderline to mild cardiomegaly. Aortic atherosclerosis. IMPRESSION: No active disease. Borderline to mild cardiomegaly. Electronically Signed   By: Esmeralda Hedge M.D.   On: 05/10/2023 16:55   CT Head Wo Contrast Result Date: 05/10/2023 CLINICAL DATA:  Head trauma weakness EXAM: CT HEAD WITHOUT CONTRAST TECHNIQUE: Contiguous axial images were obtained from the base of the skull through the vertex without intravenous contrast. RADIATION DOSE REDUCTION: This exam was performed according to the departmental dose-optimization program which includes automated exposure control, adjustment of the mA and/or kV according to patient size and/or use of iterative reconstruction technique. COMPARISON:  CT brain 05/02/2019 FINDINGS: Brain: Mild motion degradation limits the exam. Interval small chronic appearing right occipital infarct. Mild atrophy. Patchy white matter hypodensity most likely chronic small vessel ischemic change. The ventricles are non enlarged. Vascular: No hyperdense vessels.  No unexpected calcification Skull: No definitive fracture but limited by motion Sinuses/Orbits: No acute finding. Other: None IMPRESSION: 1. Mild motion degradation limits the exam. No definite CT evidence for acute intracranial abnormality. 2. Atrophy and chronic small vessel ischemic changes of the white matter. Interval small chronic appearing right occipital infarct, new compared to 2021 head CT. Electronically Signed   By: Esmeralda Hedge M.D.   On: 05/10/2023 16:53    Microbiology: Results for orders placed or performed during the hospital encounter of 05/10/23  MRSA Next Gen by PCR, Nasal     Status: None   Collection Time: 05/10/23 10:52 PM   Specimen: Nasal Mucosa; Nasal Swab  Result Value Ref Range Status   MRSA by PCR Next Gen NOT DETECTED NOT DETECTED Final    Comment: (NOTE) The GeneXpert MRSA Assay (FDA  approved for NASAL specimens only), is one component of a comprehensive MRSA colonization surveillance program. It is not intended to diagnose MRSA infection nor to guide or monitor treatment for MRSA infections. Test performance is not FDA approved in patients less than 64 years old. Performed at [facility], [address] [place], [place] [address]   Culture, blood (Routine X 2) w Reflex to ID Panel     Status: None   Collection Time: 05/13/23 11:10 AM   Specimen: BLOOD LEFT ARM  Result Value Ref Range Status   Specimen Description BLOOD LEFT ARM  Final   Special Requests   Final    Blood Culture results may not be optimal due to an inadequate volume of blood received in culture bottles   Culture   Final    NO GROWTH 5 DAYS Performed at [facility], [address] [place], [place] [address]    Report Status 05/18/2023 FINAL  Final  Culture, blood (Routine X 2) w Reflex to ID Panel     Status: None   Collection Time: 05/13/23 11:10 AM   Specimen: BLOOD LEFT HAND  Result Value Ref Range Status   Specimen Description BLOOD LEFT HAND  Final   Special Requests   Final    Blood Culture results may not be optimal due to an inadequate volume of blood received in culture bottles   Culture   Final    NO GROWTH 5 DAYS Performed at [facility], [address], [place], [place] [address]    Report Status 05/18/2023 FINAL  Final  Culture, Respiratory w Gram Stain     Status: None   Collection Time: 05/13/23  4:43 PM   Specimen: Tracheal Aspirate; Respiratory  Result Value Ref Range Status   Specimen Description   Final    TRACHEAL ASPIRATE Performed at [facility] [address], [place], [place] [address]    Special Requests   Final    NONE Performed at [facility], [address] [place], [place] [address]    Gram Stain   Final    NO WBC SEEN RARE YEAST WITH PSEUDOHYPHAE Performed at [facility], [address], [place], [place] [address]    Culture FEW CANDIDA ALBICANS  Final   Report Status 05/17/2023 FINAL  Final    Labs: CBC: Recent Labs  Lab 05/16/23 0456 05/17/23 0549 05/18/23 0537 05/20/23 0545 05/22/23 0507  WBC 8.0 7.4 8.5 10.6* 10.8*  HGB 10.2* 10.5* 10.8* 10.9* 10.4*  HCT 30.8* 31.8* 32.7* 33.1* 31.6*  MCV 98.1 98.5 97.9 97.6 98.1  PLT 207 207 193 258 283  Basic Metabolic Panel: Recent Labs  Lab 05/16/23 0456 05/17/23 0549 05/18/23 0537 05/19/23 0455 05/20/23 0545 05/22/23 0507  NA 141 142 140  --  140 137  K 3.2* 3.9 3.6  --  3.8 4.0  CL 104 107 105  --  107 106  CO2 30 30 29   --  26 25  GLUCOSE 130* 147* 112*  --  108* 111*  BUN 43* 43* 36*  --  18 13  CREATININE 1.64* 1.45* 1.18  --  0.98 0.96  CALCIUM  8.1* 8.3* 8.2*  --  8.4* 8.6*  MG 2.0 2.1  --  1.8  --   --   PHOS 3.8 3.4  --  2.7  --   --    Liver Function Tests: No results for input(s): "AST", "ALT", "ALKPHOS", "BILITOT", "PROT", "ALBUMIN" in the last 168 hours. CBG: Recent Labs  Lab 05/21/23 0825 05/21/23 1201 05/21/23 1652 05/21/23 2204 05/22/23 0741  GLUCAP 117* 83 126* 124* 115*    Discharge time spent: greater than 30 minutes.  This record has been created using Conservation officer, historic buildings. Errors have been sought and corrected,but may not always be located. Such creation errors do not reflect on the standard of care.   Signed: Luna Salinas, MD [facility] 05/22/2023

## 2023-05-22 NOTE — TOC Progression Note (Signed)
 Transition of Care [facility]) - Progression Note    Patient Details  Name: Billy Franco MRN: [id number] Date of Birth: 04/27/1940  Transition of Care [facility]) CM/SW Contact  Alexandra Ice, RN Phone Number: 05/22/2023, 11:48 AM  Clinical Narrative:     Howard Macho approval, [id number]. Sent message to Tammy at [facility], notifying of approval and auth number, asking if she can take today. Awaiting response.        Expected Discharge Plan and Services                                               Social Determinants of Health (SDOH) Interventions SDOH Screenings   Food Insecurity: No Food Insecurity (05/11/2023)  Housing: Low Risk  (05/11/2023)  Transportation Needs: No Transportation Needs (05/11/2023)  Utilities: Not At Risk (05/11/2023)  Alcohol Screen: Low Risk  (01/24/2019)  Depression (PHQ2-9): Low Risk  (02/02/2023)  Financial Resource Strain: Low Risk  (01/24/2019)  Physical Activity: Sufficiently Active (01/24/2019)  Social Connections: Unknown (05/11/2023)  Stress: No Stress Concern Present (01/24/2019)  Tobacco Use: Low Risk  (03/02/2023)   Received from [facility]    Readmission Risk Interventions     No data to display

## 2023-05-22 NOTE — TOC Progression Note (Signed)
 Transition of Care [facility]) - Progression Note    Patient Details  Name: Billy Franco MRN: [id number] Date of Birth: [birth year]  Transition of Care [facility]) CM/SW Contact  Alexandra Ice, RN Phone Number: 05/22/2023, 8:26 AM  Clinical Narrative:     Pending auth reference [id number]  NP    9 mins NP          Expected Discharge Plan and Services                                               Social Determinants of Health (SDOH) Interventions SDOH Screenings   Food Insecurity: No Food Insecurity (05/11/2023)  Housing: Low Risk  (05/11/2023)  Transportation Needs: No Transportation Needs (05/11/2023)  Utilities: Not At Risk (05/11/2023)  Alcohol Screen: Low Risk  (01/24/2019)  Depression (PHQ2-9): Low Risk  (02/02/2023)  Financial Resource Strain: Low Risk  (01/24/2019)  Physical Activity: Sufficiently Active (01/24/2019)  Social Connections: Unknown (05/11/2023)  Stress: No Stress Concern Present (01/24/2019)  Tobacco Use: Low Risk  (03/02/2023)   Received from [facility]    Readmission Risk Interventions     No data to display

## 2023-05-26 ENCOUNTER — Encounter: Admitting: Neurosurgery

## 2023-05-26 ENCOUNTER — Other Ambulatory Visit: Payer: Self-pay | Admitting: Family Medicine

## 2023-05-26 DIAGNOSIS — E1169 Type 2 diabetes mellitus with other specified complication: Secondary | ICD-10-CM

## 2023-05-29 ENCOUNTER — Inpatient Hospital Stay: Admitting: Family Medicine

## 2023-05-30 ENCOUNTER — Other Ambulatory Visit: Payer: Self-pay | Admitting: Urology

## 2023-06-04 ENCOUNTER — Encounter: Payer: Self-pay | Admitting: Neurosurgery

## 2023-06-04 ENCOUNTER — Ambulatory Visit (INDEPENDENT_AMBULATORY_CARE_PROVIDER_SITE_OTHER): Admitting: Neurosurgery

## 2023-06-04 VITALS — BP 94/56 | Temp 97.7°F | Ht 67.0 in

## 2023-06-04 DIAGNOSIS — S14129D Central cord syndrome at unspecified level of cervical spinal cord, subsequent encounter: Secondary | ICD-10-CM

## 2023-06-04 DIAGNOSIS — Z09 Encounter for follow-up examination after completed treatment for conditions other than malignant neoplasm: Secondary | ICD-10-CM

## 2023-06-04 NOTE — Progress Notes (Signed)
   DOS: 05/11/2023 (C3-5 ACDF)  HISTORY OF PRESENT ILLNESS: 06/04/2023 [name] is status post acdf for central cord syndrome.    He has had a slow improvement in his symptoms.  He has a bedsore.  He continues to have difficulty with walking.  He is having significant orthostasis.  PHYSICAL EXAMINATION:   Vitals:   06/04/23 1309  BP: (!) 94/56  Temp: 97.7 F (36.5 C)   General: Patient is well developed, well nourished, calm, collected, and in no apparent distress.  NEUROLOGICAL:  General: In no acute distress.  Awake, alert, oriented to person, place, and time. Pupils equal round and reactive to light.   Strength: Side Biceps Triceps Deltoid Interossei Grip Wrist Ext. Wrist Flex.  R 5 5 4 4 4 4 4   L 5 5 4 4 4 4 4     Incision c/d/i   ROS (Neurologic): Negative except as noted above  IMAGING: No interval imaging to review   ASSESSMENT/PLAN:  Billy Franco is doing well after anterior cervical discectomy and fusion for central cord syndrome.  He will have an extended recovery.  He should continue physical therapy.  I think he may need his blood pressure medications altered so he does not have as much orthostasis.  Will see him back in 6 weeks with x-rays.  I spent a total of 10 minutes in this patient's care today. This time was spent reviewing pertinent records including imaging studies, obtaining and confirming history, performing a directed evaluation, formulating and discussing my recommendations, and documenting the visit within the medical record.    Jodeen Munch MD, [facility] Department of Neurosurgery

## 2023-06-08 ENCOUNTER — Ambulatory Visit: Payer: Self-pay | Admitting: Family Medicine

## 2023-06-09 ENCOUNTER — Telehealth: Payer: Self-pay | Admitting: Neurosurgery

## 2023-06-09 NOTE — Telephone Encounter (Signed)
 Per Dr. Murlean Armour last note the patient was having orthostasis issues and was advised that his blood pressure medications may need to be adjusted to assist with BP control.

## 2023-06-09 NOTE — Telephone Encounter (Signed)
 ACDF on 05/11/23/  Billy Franco the physical therapist at [facility] left a message while we were at lunch. She said that she needed  a call back today. The patient is having low BP readings and his [place] collar is loose. Ruth's phone number [phone]

## 2023-06-09 NOTE — Telephone Encounter (Signed)
 Note from [place] from different encounter:   ACDF on 05/11/23   Rex Castor from [facility] is calling to report low blood pressures for the patient and also wanted our office to know that his brace is sitting loosely. She states that the patient almost passed out from the low blood pressures. She states that if the patient is not laying flat his blood pressure is dropping. She provided the following readings:    Trending sitting in bed 110/68 and 102/61 Standing 90-61 Long sitting- BP taken after 15 minutes 81/61.

## 2023-06-09 NOTE — Telephone Encounter (Signed)
 Duplicate encounter

## 2023-06-09 NOTE — Telephone Encounter (Signed)
 ACDF on 05/11/23  Rex Castor from [facility] is calling to report low blood pressures for the patient and also wanted our office to know that his brace is sitting loosely. She states that the patient almost passed out from the low blood pressures. She states that if the patient is not laying flat his blood pressure is dropping. She provided the following readings:   Trending sitting in bed 110/68 and 102/61 Standing 90-61 Long sitting- BP taken after 15 minutes 81/61

## 2023-06-16 ENCOUNTER — Ambulatory Visit: Admitting: Urology

## 2023-06-18 ENCOUNTER — Encounter: Admitting: Neurosurgery

## 2023-06-22 ENCOUNTER — Encounter: Payer: Self-pay | Admitting: Intensive Care

## 2023-06-22 ENCOUNTER — Inpatient Hospital Stay
Admission: EM | Admit: 2023-06-22 | Discharge: 2023-07-07 | DRG: 592 | Disposition: A | Discharge status: Hospice | Source: Skilled Nursing Facility | Attending: Osteopathic Medicine | Admitting: Osteopathic Medicine

## 2023-06-22 ENCOUNTER — Emergency Department

## 2023-06-22 ENCOUNTER — Other Ambulatory Visit: Payer: Self-pay

## 2023-06-22 DIAGNOSIS — E8809 Other disorders of plasma-protein metabolism, not elsewhere classified: Secondary | ICD-10-CM | POA: Diagnosis present

## 2023-06-22 DIAGNOSIS — L89150 Pressure ulcer of sacral region, unstageable: Secondary | ICD-10-CM | POA: Diagnosis not present

## 2023-06-22 DIAGNOSIS — S14123S Central cord syndrome at C3 level of cervical spinal cord, sequela: Secondary | ICD-10-CM

## 2023-06-22 DIAGNOSIS — G992 Myelopathy in diseases classified elsewhere: Secondary | ICD-10-CM | POA: Diagnosis present

## 2023-06-22 DIAGNOSIS — Z955 Presence of coronary angioplasty implant and graft: Secondary | ICD-10-CM

## 2023-06-22 DIAGNOSIS — L89154 Pressure ulcer of sacral region, stage 4: Principal | ICD-10-CM | POA: Insufficient documentation

## 2023-06-22 DIAGNOSIS — Z7189 Other specified counseling: Secondary | ICD-10-CM | POA: Diagnosis not present

## 2023-06-22 DIAGNOSIS — N319 Neuromuscular dysfunction of bladder, unspecified: Secondary | ICD-10-CM | POA: Diagnosis present

## 2023-06-22 DIAGNOSIS — Z7984 Long term (current) use of oral hypoglycemic drugs: Secondary | ICD-10-CM

## 2023-06-22 DIAGNOSIS — E875 Hyperkalemia: Secondary | ICD-10-CM | POA: Diagnosis not present

## 2023-06-22 DIAGNOSIS — I480 Paroxysmal atrial fibrillation: Secondary | ICD-10-CM | POA: Diagnosis present

## 2023-06-22 DIAGNOSIS — R29898 Other symptoms and signs involving the musculoskeletal system: Secondary | ICD-10-CM | POA: Diagnosis not present

## 2023-06-22 DIAGNOSIS — Z960 Presence of urogenital implants: Secondary | ICD-10-CM | POA: Diagnosis present

## 2023-06-22 DIAGNOSIS — E785 Hyperlipidemia, unspecified: Secondary | ICD-10-CM | POA: Diagnosis present

## 2023-06-22 DIAGNOSIS — R64 Cachexia: Secondary | ICD-10-CM | POA: Diagnosis present

## 2023-06-22 DIAGNOSIS — N390 Urinary tract infection, site not specified: Secondary | ICD-10-CM | POA: Diagnosis present

## 2023-06-22 DIAGNOSIS — Z79890 Hormone replacement therapy (postmenopausal): Secondary | ICD-10-CM

## 2023-06-22 DIAGNOSIS — R1319 Other dysphagia: Secondary | ICD-10-CM | POA: Diagnosis present

## 2023-06-22 DIAGNOSIS — K9421 Gastrostomy hemorrhage: Secondary | ICD-10-CM | POA: Diagnosis not present

## 2023-06-22 DIAGNOSIS — S12090D Other displaced fracture of first cervical vertebra, subsequent encounter for fracture with routine healing: Secondary | ICD-10-CM | POA: Diagnosis not present

## 2023-06-22 DIAGNOSIS — R1313 Dysphagia, pharyngeal phase: Secondary | ICD-10-CM | POA: Diagnosis present

## 2023-06-22 DIAGNOSIS — I5022 Chronic systolic (congestive) heart failure: Secondary | ICD-10-CM | POA: Diagnosis present

## 2023-06-22 DIAGNOSIS — Z91048 Other nonmedicinal substance allergy status: Secondary | ICD-10-CM

## 2023-06-22 DIAGNOSIS — I2510 Atherosclerotic heart disease of native coronary artery without angina pectoris: Secondary | ICD-10-CM | POA: Diagnosis present

## 2023-06-22 DIAGNOSIS — I951 Orthostatic hypotension: Secondary | ICD-10-CM | POA: Diagnosis present

## 2023-06-22 DIAGNOSIS — N1832 Chronic kidney disease, stage 3b: Secondary | ICD-10-CM | POA: Diagnosis present

## 2023-06-22 DIAGNOSIS — M4802 Spinal stenosis, cervical region: Secondary | ICD-10-CM | POA: Diagnosis present

## 2023-06-22 DIAGNOSIS — B9562 Methicillin resistant Staphylococcus aureus infection as the cause of diseases classified elsewhere: Secondary | ICD-10-CM | POA: Diagnosis not present

## 2023-06-22 DIAGNOSIS — A419 Sepsis, unspecified organism: Principal | ICD-10-CM | POA: Diagnosis present

## 2023-06-22 DIAGNOSIS — L8990 Pressure ulcer of unspecified site, unspecified stage: Secondary | ICD-10-CM | POA: Diagnosis present

## 2023-06-22 DIAGNOSIS — E860 Dehydration: Secondary | ICD-10-CM | POA: Diagnosis present

## 2023-06-22 DIAGNOSIS — Z7982 Long term (current) use of aspirin: Secondary | ICD-10-CM

## 2023-06-22 DIAGNOSIS — R630 Anorexia: Secondary | ICD-10-CM

## 2023-06-22 DIAGNOSIS — Z87442 Personal history of urinary calculi: Secondary | ICD-10-CM

## 2023-06-22 DIAGNOSIS — B952 Enterococcus as the cause of diseases classified elsewhere: Secondary | ICD-10-CM | POA: Diagnosis present

## 2023-06-22 DIAGNOSIS — E1122 Type 2 diabetes mellitus with diabetic chronic kidney disease: Secondary | ICD-10-CM | POA: Diagnosis present

## 2023-06-22 DIAGNOSIS — L089 Local infection of the skin and subcutaneous tissue, unspecified: Principal | ICD-10-CM

## 2023-06-22 DIAGNOSIS — E039 Hypothyroidism, unspecified: Secondary | ICD-10-CM | POA: Diagnosis present

## 2023-06-22 DIAGNOSIS — R54 Age-related physical debility: Secondary | ICD-10-CM | POA: Diagnosis present

## 2023-06-22 DIAGNOSIS — I255 Ischemic cardiomyopathy: Secondary | ICD-10-CM | POA: Diagnosis present

## 2023-06-22 DIAGNOSIS — B965 Pseudomonas (aeruginosa) (mallei) (pseudomallei) as the cause of diseases classified elsewhere: Secondary | ICD-10-CM | POA: Diagnosis not present

## 2023-06-22 DIAGNOSIS — Z66 Do not resuscitate: Secondary | ICD-10-CM | POA: Diagnosis not present

## 2023-06-22 DIAGNOSIS — D631 Anemia in chronic kidney disease: Secondary | ICD-10-CM | POA: Diagnosis present

## 2023-06-22 DIAGNOSIS — Z6823 Body mass index (BMI) 23.0-23.9, adult: Secondary | ICD-10-CM

## 2023-06-22 DIAGNOSIS — Z888 Allergy status to other drugs, medicaments and biological substances status: Secondary | ICD-10-CM

## 2023-06-22 DIAGNOSIS — Z885 Allergy status to narcotic agent status: Secondary | ICD-10-CM

## 2023-06-22 DIAGNOSIS — W19XXXS Unspecified fall, sequela: Secondary | ICD-10-CM | POA: Diagnosis present

## 2023-06-22 DIAGNOSIS — Z515 Encounter for palliative care: Secondary | ICD-10-CM

## 2023-06-22 DIAGNOSIS — R29818 Other symptoms and signs involving the nervous system: Secondary | ICD-10-CM | POA: Diagnosis present

## 2023-06-22 DIAGNOSIS — R531 Weakness: Secondary | ICD-10-CM | POA: Diagnosis not present

## 2023-06-22 DIAGNOSIS — K219 Gastro-esophageal reflux disease without esophagitis: Secondary | ICD-10-CM | POA: Diagnosis present

## 2023-06-22 DIAGNOSIS — I130 Hypertensive heart and chronic kidney disease with heart failure and stage 1 through stage 4 chronic kidney disease, or unspecified chronic kidney disease: Secondary | ICD-10-CM | POA: Diagnosis present

## 2023-06-22 DIAGNOSIS — E43 Unspecified severe protein-calorie malnutrition: Secondary | ICD-10-CM | POA: Diagnosis present

## 2023-06-22 DIAGNOSIS — M4628 Osteomyelitis of vertebra, sacral and sacrococcygeal region: Secondary | ICD-10-CM | POA: Diagnosis present

## 2023-06-22 DIAGNOSIS — R627 Adult failure to thrive: Secondary | ICD-10-CM | POA: Diagnosis present

## 2023-06-22 DIAGNOSIS — Z981 Arthrodesis status: Secondary | ICD-10-CM

## 2023-06-22 DIAGNOSIS — M79606 Pain in leg, unspecified: Secondary | ICD-10-CM | POA: Diagnosis not present

## 2023-06-22 DIAGNOSIS — Z8249 Family history of ischemic heart disease and other diseases of the circulatory system: Secondary | ICD-10-CM

## 2023-06-22 DIAGNOSIS — Z79899 Other long term (current) drug therapy: Secondary | ICD-10-CM

## 2023-06-22 DIAGNOSIS — R112 Nausea with vomiting, unspecified: Secondary | ICD-10-CM | POA: Diagnosis present

## 2023-06-22 DIAGNOSIS — J392 Other diseases of pharynx: Secondary | ICD-10-CM | POA: Diagnosis not present

## 2023-06-22 DIAGNOSIS — R4702 Dysphasia: Secondary | ICD-10-CM | POA: Diagnosis present

## 2023-06-22 DIAGNOSIS — Z87448 Personal history of other diseases of urinary system: Secondary | ICD-10-CM

## 2023-06-22 DIAGNOSIS — Z7902 Long term (current) use of antithrombotics/antiplatelets: Secondary | ICD-10-CM

## 2023-06-22 DIAGNOSIS — Z7401 Bed confinement status: Secondary | ICD-10-CM

## 2023-06-22 DIAGNOSIS — E1169 Type 2 diabetes mellitus with other specified complication: Secondary | ICD-10-CM | POA: Diagnosis present

## 2023-06-22 LAB — CBC WITH DIFFERENTIAL/PLATELET
Abs Immature Granulocytes: 0.12 10*3/uL — ABNORMAL HIGH (ref 0.00–0.07)
Basophils Absolute: 0.1 10*3/uL (ref 0.0–0.1)
Basophils Relative: 1 %
Eosinophils Absolute: 0.2 10*3/uL (ref 0.0–0.5)
Eosinophils Relative: 2 %
HCT: 32.8 % — ABNORMAL LOW (ref 39.0–52.0)
Hemoglobin: 10.6 g/dL — ABNORMAL LOW (ref 13.0–17.0)
Immature Granulocytes: 1 %
Lymphocytes Relative: 12 %
Lymphs Abs: 1.1 10*3/uL (ref 0.7–4.0)
MCH: 31 pg (ref 26.0–34.0)
MCHC: 32.3 g/dL (ref 30.0–36.0)
MCV: 95.9 fL (ref 80.0–100.0)
Monocytes Absolute: 0.5 10*3/uL (ref 0.1–1.0)
Monocytes Relative: 6 %
Neutro Abs: 6.9 10*3/uL (ref 1.7–7.7)
Neutrophils Relative %: 78 %
Platelets: 392 10*3/uL (ref 150–400)
RBC: 3.42 MIL/uL — ABNORMAL LOW (ref 4.22–5.81)
RDW: 13.7 % (ref 11.5–15.5)
WBC: 8.9 10*3/uL (ref 4.0–10.5)
nRBC: 0 % (ref 0.0–0.2)

## 2023-06-22 LAB — CBG MONITORING, ED: Glucose-Capillary: 196 mg/dL — ABNORMAL HIGH (ref 70–99)

## 2023-06-22 LAB — TROPONIN I (HIGH SENSITIVITY)
Troponin I (High Sensitivity): 13 ng/L (ref <18)
Troponin I (High Sensitivity): 14 ng/L (ref <18)

## 2023-06-22 LAB — COMPREHENSIVE METABOLIC PANEL WITH GFR
ALT: 19 U/L (ref 0–44)
AST: 27 U/L (ref 15–41)
Albumin: 2.5 g/dL — ABNORMAL LOW (ref 3.5–5.0)
Alkaline Phosphatase: 87 U/L (ref 38–126)
Anion gap: 8 (ref 5–15)
BUN: 21 mg/dL (ref 8–23)
CO2: 26 mmol/L (ref 22–32)
Calcium: 9 mg/dL (ref 8.9–10.3)
Chloride: 100 mmol/L (ref 98–111)
Creatinine, Ser: 0.91 mg/dL (ref 0.61–1.24)
GFR, Estimated: >60 mL/min (ref >60)
Glucose, Bld: 359 mg/dL — ABNORMAL HIGH (ref 70–99)
Potassium: 4.3 mmol/L (ref 3.5–5.1)
Sodium: 134 mmol/L — ABNORMAL LOW (ref 135–145)
Total Bilirubin: 0.6 mg/dL (ref 0.0–1.2)
Total Protein: 6.5 g/dL (ref 6.5–8.1)

## 2023-06-22 LAB — URINALYSIS, W/ REFLEX TO CULTURE (INFECTION SUSPECTED)
Bilirubin Urine: NEGATIVE
Glucose, UA: 250 mg/dL — AB
Ketones, ur: NEGATIVE mg/dL
Nitrite: NEGATIVE
Protein, ur: 30 mg/dL — AB
Specific Gravity, Urine: 1.02 (ref 1.005–1.030)
pH: 5.5 (ref 5.0–8.0)

## 2023-06-22 LAB — GLUCOSE, CAPILLARY: Glucose-Capillary: 172 mg/dL — ABNORMAL HIGH (ref 70–99)

## 2023-06-22 LAB — BRAIN NATRIURETIC PEPTIDE: B Natriuretic Peptide: 203.8 pg/mL — ABNORMAL HIGH (ref 0.0–100.0)

## 2023-06-22 LAB — LACTIC ACID, PLASMA
Lactic Acid, Venous: 2.7 mmol/L (ref 0.5–1.9)
Lactic Acid, Venous: 2 mmol/L (ref 0.5–1.9)

## 2023-06-22 LAB — LIPASE, BLOOD: Lipase: 45 U/L (ref 11–51)

## 2023-06-22 MED ORDER — ONDANSETRON HCL 4 MG PO TABS: 4.0000 mg | ORAL_TABLET | Freq: Four times a day (QID) | ORAL | Status: DC | PRN

## 2023-06-22 MED ORDER — INSULIN GLARGINE-YFGN 100 UNIT/ML ~~LOC~~ SOPN
10.0000 [IU] | PEN_INJECTOR | Freq: Every day | SUBCUTANEOUS | Status: DC
  Filled 2023-06-22: qty 3

## 2023-06-22 MED ORDER — SODIUM CHLORIDE 0.9 % IV SOLN
2.0000 g | Freq: Once | INTRAVENOUS | Status: AC
  Administered 2023-06-22: 2 g via INTRAVENOUS
  Filled 2023-06-22: qty 12.5

## 2023-06-22 MED ORDER — LACTATED RINGERS IV BOLUS
500.0000 mL | Freq: Once | INTRAVENOUS | Status: AC
  Administered 2023-06-22: 500 mL via INTRAVENOUS

## 2023-06-22 MED ORDER — VANCOMYCIN HCL 1500 MG/300ML IV SOLN
1500.0000 mg | Freq: Once | INTRAVENOUS | Status: AC
  Administered 2023-06-22: 1500 mg via INTRAVENOUS
  Filled 2023-06-22: qty 300

## 2023-06-22 MED ORDER — INSULIN ASPART 100 UNIT/ML IJ SOLN
0.0000 [IU] | Freq: Three times a day (TID) | INTRAMUSCULAR | Status: DC
  Administered 2023-06-22 – 2023-06-23 (×2): 3 [IU] via SUBCUTANEOUS
  Administered 2023-06-24 – 2023-06-25 (×2): 2 [IU] via SUBCUTANEOUS
  Administered 2023-06-25: 3 [IU] via SUBCUTANEOUS
  Administered 2023-06-27: 5 [IU] via SUBCUTANEOUS
  Administered 2023-06-27 (×2): 3 [IU] via SUBCUTANEOUS
  Administered 2023-06-28: 5 [IU] via SUBCUTANEOUS
  Administered 2023-06-29: 8 [IU] via SUBCUTANEOUS
  Administered 2023-06-29 – 2023-06-30 (×3): 3 [IU] via SUBCUTANEOUS
  Administered 2023-06-30: 2 [IU] via SUBCUTANEOUS
  Administered 2023-06-30: 14 [IU] via SUBCUTANEOUS
  Administered 2023-07-01: 5 [IU] via SUBCUTANEOUS
  Filled 2023-06-22 (×16): qty 1

## 2023-06-22 MED ORDER — INSULIN ASPART 100 UNIT/ML IJ SOLN
0.0000 [IU] | Freq: Every day | INTRAMUSCULAR | Status: DC
  Filled 2023-06-22: qty 1

## 2023-06-22 MED ORDER — ONDANSETRON HCL 4 MG/2ML IJ SOLN
4.0000 mg | Freq: Once | INTRAMUSCULAR | Status: AC
  Administered 2023-06-22: 4 mg via INTRAVENOUS
  Filled 2023-06-22: qty 2

## 2023-06-22 MED ORDER — DOCUSATE SODIUM 50 MG/5ML PO LIQD
100.0000 mg | Freq: Two times a day (BID) | ORAL | Status: DC
  Administered 2023-06-22 – 2023-06-23 (×2): 100 mg via ORAL
  Filled 2023-06-22 (×2): qty 10

## 2023-06-22 MED ORDER — ACETAMINOPHEN 650 MG RE SUPP: 650.0000 mg | Freq: Four times a day (QID) | RECTAL | Status: DC | PRN

## 2023-06-22 MED ORDER — LACTATED RINGERS IV BOLUS: 1000.0000 mL | Freq: Once | INTRAVENOUS | Status: DC

## 2023-06-22 MED ORDER — POLYETHYLENE GLYCOL 3350 17 G PO PACK: 17.0000 g | PACK | Freq: Every day | ORAL | Status: DC | PRN

## 2023-06-22 MED ORDER — IOHEXOL 300 MG/ML  SOLN
100.0000 mL | Freq: Once | INTRAMUSCULAR | Status: AC | PRN
  Administered 2023-06-22: 100 mL via INTRAVENOUS

## 2023-06-22 MED ORDER — HYDROMORPHONE HCL 1 MG/ML IJ SOLN
0.5000 mg | INTRAMUSCULAR | Status: DC | PRN
  Administered 2023-06-23 – 2023-06-30 (×21): 0.5 mg via INTRAVENOUS
  Filled 2023-06-22 (×21): qty 0.5

## 2023-06-22 MED ORDER — ACETAMINOPHEN 325 MG PO TABS
650.0000 mg | ORAL_TABLET | Freq: Four times a day (QID) | ORAL | Status: DC | PRN
  Administered 2023-06-27 – 2023-07-07 (×6): 650 mg via ORAL
  Filled 2023-06-22 (×6): qty 2

## 2023-06-22 MED ORDER — ONDANSETRON HCL 4 MG/2ML IJ SOLN: 4.0000 mg | Freq: Four times a day (QID) | INTRAMUSCULAR | Status: DC | PRN

## 2023-06-22 MED ORDER — ENOXAPARIN SODIUM 40 MG/0.4ML IJ SOSY
40.0000 mg | PREFILLED_SYRINGE | INTRAMUSCULAR | Status: DC
  Administered 2023-06-22 – 2023-06-24 (×3): 40 mg via SUBCUTANEOUS
  Filled 2023-06-22 (×4): qty 0.4

## 2023-06-22 MED ORDER — LACTATED RINGERS IV BOLUS
1000.0000 mL | Freq: Once | INTRAVENOUS | Status: AC
  Administered 2023-06-22: 1000 mL via INTRAVENOUS

## 2023-06-22 MED ORDER — INSULIN GLARGINE-YFGN 100 UNIT/ML ~~LOC~~ SOLN
10.0000 [IU] | Freq: Every day | SUBCUTANEOUS | Status: DC
  Administered 2023-06-22: 10 [IU] via SUBCUTANEOUS
  Filled 2023-06-22: qty 0.1

## 2023-06-22 MED ORDER — VANCOMYCIN HCL 1250 MG/250ML IV SOLN
1250.0000 mg | INTRAVENOUS | Status: DC
  Filled 2023-06-22: qty 250

## 2023-06-22 MED ORDER — ENSURE PLUS HIGH PROTEIN PO LIQD
237.0000 mL | Freq: Two times a day (BID) | ORAL | Status: DC
  Administered 2023-06-22 – 2023-06-23 (×2): 237 mL via ORAL

## 2023-06-22 MED ORDER — SODIUM CHLORIDE 0.9 % IV SOLN
2.0000 g | Freq: Two times a day (BID) | INTRAVENOUS | Status: DC
  Administered 2023-06-22 – 2023-06-23 (×2): 2 g via INTRAVENOUS
  Filled 2023-06-22 (×2): qty 12.5

## 2023-06-22 NOTE — Consult Note (Signed)
 Pharmacy Antibiotic Note  Billy Franco is a 83 y.o. male admitted on 06/22/2023 with Wound infection.  Pharmacy has been consulted for cefepime and vancomycin dosing. Afeb, WBC 8.9, lactic acid 2.   Plan: Cefepime 2 g q12H  Pt was given vancomycin 1500 mg IV x 1 followed by 1250 mg q24H. Predicted AUC of 498. Goal AUC of 400-660. Vd 0.72. Scr 0.91. IBW. Plan to obtain vancomycin level after 4th or 5th dose.     Height: 5\' 7"  (170.2 cm) Weight: 65.8 kg (145 lb) IBW/kg (Calculated) : 66.1  Temp (24hrs), Avg:97.7 F (36.5 C), Min:97.5 F (36.4 C), Max:97.9 F (36.6 C)  Recent Labs  Lab 06/22/23 1018 06/22/23 1212  WBC 8.9  --   CREATININE 0.91  --   LATICACIDVEN 2.7* 2.0*    Estimated Creatinine Clearance: 58.2 mL/min (by C-G formula based on SCr of 0.91 mg/dL).    Allergies  Allergen Reactions   Codeine Rash and Other (See Comments)    Per pt "hard on my kidneys"     Lovastatin Rash and Other (See Comments)   Tape Rash    Antimicrobials this admission: 6/2 cefepime >>  6/2 vancomycin >>   Dose adjustments this admission: None  Microbiology results: None  Thank you for allowing pharmacy to be a part of this patient's care.  Trinidad Funk, PharmD, BCPS 06/22/2023 4:01 PM

## 2023-06-22 NOTE — ED Triage Notes (Signed)
 Arrived by [facility] from [facility] for N/V X7 days. Surgery on neck a couple weeks ago and been at rehab about a week.  Wife reports patient needs C-collar on when moving. Patient arrived by C-collar.   EMS vitals:  89/60b/p 100% RA 72HR 18RR

## 2023-06-22 NOTE — ED Notes (Signed)
 Critical lactic - 2.7

## 2023-06-22 NOTE — Consult Note (Addendum)
 WOC Nurse Consult Note: Reason for Consult: Consult requested for sacrum wound.   Secure chat message sent to the primary team as follows, "Please note that the WOC nursing team is utilizing a standardized work plan to manage patient consults. We are triaging consults and will try to see the patients within 48 hours. Wound photos in the patient's chart allow us  to consult on the patient in the most efficient and timely manner." Thank-you,  Wiliam Harder MSN, RN, [employer], [place], CNS [phone]

## 2023-06-22 NOTE — ED Provider Notes (Signed)
 [facility] Provider Note    Event Date/Time   First MD Initiated Contact with Patient 06/22/23 1009     (approximate)   History   Emesis   HPI  Billy Franco is a 84 y.o. 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  Has had decrease in p.o. intake for last several days.  He denies any new weakness or numbness, has indwelling Foley, no abdominal pain.  No diarrhea, no leg swelling, chest pain, shortness of breath, cough.  Patient states that his blood pressures tend to be low.  Independent history of so obtained from wife, she states that he has had no nausea vomiting for last 3 days, he had an ultrasound done at his facility that showed thickened gallbladder wall.  Has not had bowel movement in the last several days.  She also states that he is on 2 different antibiotics for infection of his sacral ulcers.  Per independent history from EMS, he has had nausea vomiting for the last 7 days, has not been able to tolerate p.o.  Has a c-collar in place when moving.  Their initial blood pressures were 89/60, heart rate was 72, satting 100% on room air.  Per independent chart review, patient was admitted in late April for bilateral upper and lower extremity weakness and numbness after fall.  Status post decompression with neurosurgery, after he was found to have C3-C4 chronic compression stenosis as well as new fracture/disc herniation at C5 and 6.  At that time was noted to have bilateral upper extremity weakness, left greater than right.  He was seen by neurosurgery for follow-up in mid May, noted blood pressure 94/56 during that visit.  He was also seen by his primary care doctor for the hypertension and started on midodrine.    Physical Exam   Triage Vital Signs: ED Triage Vitals  Encounter Vitals Group     BP       Systolic BP Percentile      Diastolic BP Percentile      Pulse      Resp      Temp      Temp src      SpO2      Weight      Height      Head Circumference      Peak Flow      Pain Score      Pain Loc      Pain Education      Exclude from Growth Chart     Most recent vital signs: Vitals:   06/22/23 1019  Pulse: 97  Resp: (!) 26  Temp: (!) 97.5 F (36.4 C)  SpO2: 100%     General: Awake, no distress.  CV:  Good peripheral perfusion.  Resp:  Normal effort.  Clear Abd:  No distention.  Soft nontender Other:  Indwelling Foley with yellow urine, he has no lower extremity edema, no unilateral calf swelling or tenderness, his bilateral grip strength is weak, no obvious focal weakness to lower extremities, no sensory deficits.  Healing bilateral heel ulcer without erythema or purulent drainage, he has a sacral ulcer with surrounding erythema, mild purulent drainage.   ED Results / Procedures / Treatments   Labs (all labs ordered are listed, but only abnormal results are displayed) Labs Reviewed  COMPREHENSIVE METABOLIC PANEL WITH  GFR - Abnormal; Notable for the following components:      Result Value   Sodium 134 (*)    Glucose, Bld 359 (*)    Albumin 2.5 (*)    All other components within normal limits  LACTIC ACID, PLASMA - Abnormal; Notable for the following components:   Lactic Acid, Venous 2.7 (*)    All other components within normal limits  LACTIC ACID, PLASMA - Abnormal; Notable for the following components:   Lactic Acid, Venous 2.0 (*)    All other components within normal limits  CBC WITH DIFFERENTIAL/PLATELET - Abnormal; Notable for the following components:   RBC 3.42 (*)    Hemoglobin 10.6 (*)    HCT 32.8 (*)    Abs Immature Granulocytes 0.12 (*)    All other components within normal limits  URINALYSIS, W/ REFLEX TO CULTURE (INFECTION SUSPECTED) - Abnormal; Notable for the following components:   Glucose, UA 250 (*)    Hgb urine dipstick MODERATE  (*)    Protein, ur 30 (*)    Leukocytes,Ua SMALL (*)    Bacteria, UA RARE (*)    All other components within normal limits  BRAIN NATRIURETIC PEPTIDE - Abnormal; Notable for the following components:   B Natriuretic Peptide 203.8 (*)    All other components within normal limits  LIPASE, BLOOD  TROPONIN I (HIGH SENSITIVITY)  TROPONIN I (HIGH SENSITIVITY)     EKG  EKG shows, sinus rhythm, rate 96, normal QS, normal QTc, baseline is wandering but no ischemic ST elevation, T wave flattening in 3, aVL, aVF, T wave change 2-3 is new compared to prior   RADIOLOGY On my independent interpretation, CT without obvious free air   PROCEDURES:  Critical Care performed: Yes, see critical care procedure note(s)  .Critical Care  Performed by: Shane Darling, MD Authorized by: Shane Darling, MD   Critical care provider statement:    Critical care time (minutes):  35   Critical care was necessary to treat or prevent imminent or life-threatening deterioration of the following conditions:  Sepsis   Critical care was time spent personally by me on the following activities:  Development of treatment plan with patient or surrogate, discussions with consultants, evaluation of patient's response to treatment, examination of patient, ordering and review of laboratory studies, ordering and review of radiographic studies, ordering and performing treatments and interventions, pulse oximetry, re-evaluation of patient's condition and review of old charts    MEDICATIONS ORDERED IN ED: Medications  vancomycin (VANCOREADY) IVPB 1500 mg/300 mL (1,500 mg Intravenous New Bag/Given 06/22/23 1318)  lactated ringers  bolus 1,000 mL (0 mLs Intravenous Stopped 06/22/23 1117)  ondansetron  (ZOFRAN ) injection 4 mg (4 mg Intravenous Given 06/22/23 1029)  ceFEPIme (MAXIPIME) 2 g in sodium chloride  0.9 % 100 mL IVPB (0 g Intravenous Stopped 06/22/23 1302)  iohexol  (OMNIPAQUE ) 300 MG/ML solution 100 mL (100 mLs Intravenous Contrast  Given 06/22/23 1237)  lactated ringers  bolus 500 mL (500 mLs Intravenous New Bag/Given 06/22/23 1304)     IMPRESSION / MDM / ASSESSMENT AND PLAN / ED COURSE  I reviewed the triage vital signs and the nursing notes.                              Differential diagnosis includes, but is not limited to, failure to thrive, dehydration, electrolyte derangements, UTI, sacral ulcer infection, osteomyelitis, atypical ACS, gastroenteritis, viral illness.  Patient had ultrasound that showed thickened gallbladder  wall, he has no nausea or vomiting, abdomen soft nontender, did consider biliary etiology given history of nausea vomiting, also given history of no bowel movements for the last several days, consider constipation, ileus, SBO, partial SBO.  Will get a CT, will get labs, EKG, troponin, chest x-ray, UA.  IV fluids.  Reassess.  Patient's presentation is most consistent with acute presentation with potential threat to life or bodily function.  Independent interpretation of labs and imaging below.  Blood pressure when I reassessed patient in the room was 130s.  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 intake, infected ulcers, he will need to be admitted for further management.  He was already given IV antibiotics earlier.  Consult the hospitalist was agreeable with this plan for admission and will evaluate the patient.  He is admitted.  The patient is on the cardiac monitor to evaluate for evidence of arrhythmia and/or significant heart rate changes.   Clinical Course as of 06/22/23 1347  Mon Jun 22, 2023  1313 Independent review of labs, electrolytes not severely deranged, LFTs are normal, lactate is elevated, BNP is mildly elevated but patient does not appear volume overloaded at this time, no leukocytosis, troponin is not elevated, UA  is consistent with mild UTI. [TT]  1338 CT ABDOMEN PELVIS W CONTRAST IMPRESSION: *Sacral ulcer with exposure of the tip of the Cox's correlate with chronic decubitus ulcer and associated osteomyelitis. Findings unchanged since prior examination no evidence of drainable abscesses or fluid collections. *Left nephrolithiasis without hydronephrosis. *Abundant residual fecal material colon. *Diffuse diverticulosis colon without diverticulitis. *Aortic atherosclerosis.   [TT]    Clinical Course User Index [TT] Shane Darling, MD     FINAL CLINICAL IMPRESSION(S) / ED DIAGNOSES   Final diagnoses:  Infected decubitus ulcer, unspecified ulcer stage  Decreased appetite  Urinary tract infection without hematuria, site unspecified  Dehydration  Sepsis, due to unspecified organism, unspecified whether acute organ dysfunction present [facility])     Rx / DC Orders   ED Discharge Orders     None        Note:  This document was prepared using Dragon voice recognition software and may include unintentional dictation errors.    Shane Darling, MD 06/22/23 [phone]

## 2023-06-22 NOTE — Consult Note (Signed)
 ED Pharmacy Antibiotic Sign Off An antibiotic consult was received from an ED provider for cefepime and vancomycin per pharmacy dosing for wound infection. A chart review was completed to assess appropriateness.   The following one time order(s) were placed:  Cefepime and vancomycin.   Further antibiotic and/or antibiotic pharmacy consults should be ordered by the admitting provider if indicated.   Thank you for allowing pharmacy to be a part of this patient's care.   Trinidad Funk, [facility]  Clinical Pharmacist 06/22/23 11:59 AM

## 2023-06-22 NOTE — H&P (Signed)
 History and Physical    Billy Franco HYQ:[id number] DOB: November 29, 1940 DOA: 06/22/2023  PCP: Sowles, Krichna, MD (Confirm with patient/family/NH records and if not entered, this has to be entered at [facility] point of entry) Patient coming from: SNF  I have personally briefly reviewed patient's old medical records in [facility]  Chief Complaint: Feeling sick  HPI: Billy Franco is a 83 y.o. 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  Patient was recently hospitalized for fall and cervical spine injuries and stenosis underwent decompression on 4/21.  Patient discharged to nursing home for rehab.  However patient told me that she has not been able to participate in physical therapy since " blood pressure dropped when standing up", he spent most of his last 5 to 6 weeks bedbound at nursing home as a result.  Slowly he started develop pressure ulcer on the sacral area, for which she has been seen by wound care team as well as wound care physician in the nursing home who started him on p.o. antibiotics combination of 2 antibiotics about 2 weeks ago and his to taking both as of yesterday.  He also reported swallowing problems, his diet has been downgraded to soft however family has witnessed several episodes of choking and coughing after eating.  His appetite decreased significantly and estimated at least several pounds of weight loss compared to last month.  Denies any abdominal pain no diarrhea.  This morning, he was told that " there was a concern about gallbladder problems causing GI symptoms" and he was sent to ED for further evaluation.  ED Course: Afebrile, nontachycardic  nonhypotensive nonhypoxic.  CT abdominal pelvis showed no acute findings seen stomach.  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  Blood work showed BUN 13 creatinine 0.9.  Patient was given vancomycin and cefepime.  Review of Systems: As per HPI otherwise 14 point review of systems negative.    Past Medical History:  Diagnosis Date   Arthritis    Lower back, hips   Atrial fibrillation (HCC)    CHF (congestive heart failure) (HCC)    CKD (chronic kidney disease), stage III (HCC)    Complication of anesthesia    has "crooked airway"  Diff breathing after extubated (after kidney stone procedure 07/2013)   Diabetes mellitus without complication (HCC)    GERD (gastroesophageal reflux disease)    Hypertension    Hypothyroidism    Nephrolithiasis     Past Surgical History:  Procedure Laterality Date   ANTERIOR CERVICAL DECOMP/DISCECTOMY FUSION N/A 05/11/2023   Procedure: ANTERIOR CERVICAL DECOMPRESSION/DISCECTOMY FUSION 2 LEVELS;  Surgeon: Jodeen Munch, MD;  Location: [facility];  Service: Neurosurgery;  Laterality: N/A;   CATARACT EXTRACTION W/PHACO Right 05/07/2021   Procedure: CATARACT EXTRACTION PHACO AND INTRAOCULAR LENS PLACEMENT (IOC) RIGHT DIABETIC 8.63 00:56.5;  Surgeon: Clair Crews, MD;  Location: [facility];  Service: Ophthalmology;  Laterality: Right;  Diabetic   CATARACT EXTRACTION W/PHACO Left 05/21/2021   Procedure: CATARACT EXTRACTION PHACO AND INTRAOCULAR LENS PLACEMENT (IOC) LEFT DIABETIC 7.27 01:01.5;  Surgeon: Clair Crews, MD;  Location: [facility];  Service: Ophthalmology;  Laterality: Left;  Diabetic   CORONARY STENT INTERVENTION N/A 03/08/2020   Procedure: CORONARY STENT  INTERVENTION;  Surgeon: Antonette Batters, MD;  Location: [facility];  Service: Cardiovascular;  Laterality: N/A;   LEFT HEART CATH AND CORONARY ANGIOGRAPHY Left 03/08/2020   Procedure: LEFT HEART CATH AND CORONARY  ANGIOGRAPHY;  Surgeon: Antonette Batters, MD;  Location: [facility];  Service: Cardiovascular;  Laterality: Left;   NASAL FRACTURE SURGERY  1994   URETEROLITHOTOMY Right 2015     reports that he has never smoked. He has never used smokeless tobacco. He reports that he does not drink alcohol and does not use drugs.  Allergies  Allergen Reactions   Codeine Rash and Other (See Comments)    Per pt "hard on my kidneys"     Lovastatin Rash and Other (See Comments)   Tape Rash    Family History  Problem Relation Age of Onset   Heart disease Father      Prior to Admission medications   Medication Sig Start Date End Date Taking? Authorizing Provider  acetaminophen  (TYLENOL ) 650 MG CR tablet Take 650 mg by mouth every 8 (eight) hours as needed for pain.    [provider]  aluminum hydroxide-magnesium  carbonate (GAVISCON) 95-358 MG/15ML SUSP Take by mouth.    [provider]  aspirin  EC 81 MG tablet Take 1 tablet (81 mg total) by mouth daily. Swallow whole. 05/22/23   Luna Salinas, MD  atorvastatin  (LIPITOR) 10 MG tablet Take 1 tablet (10 mg total) by mouth daily at 6 PM. 02/02/23   Sowles, Krichna, MD  blood glucose meter kit and supplies 1 each by Other route as directed. Dispense based on patient and insurance preference. Use up to four times daily as directed. (FOR ICD-10 E10.9, E11.9). 09/09/21   Sowles, Krichna, MD  carvedilol  (COREG ) 3.125 MG tablet Take 3.125 mg by mouth 2 (two) times daily. 05/31/23   [provider]  Cholecalciferol (VITAMIN D -1000 MAX ST) 25 MCG (1000 UT) tablet Take 1 tablet by mouth daily.    [provider]  clopidogrel  (PLAVIX ) 75 MG tablet Take 1 tablet (75 mg total) by mouth daily. Please resume from 05/25/2023 05/22/23   Amin, Sumayya, MD  Cyanocobalamin (B-12) 1000 MCG TBCR Take 1,000 mcg by mouth every other day. 01/02/20   [provider]  docusate sodium  (COLACE) 50 MG capsule Take 100 mg by mouth 2 (two)  times daily as needed for mild constipation.    [provider]  furosemide  (LASIX ) 40 MG tablet Take 40 mg by mouth daily. 05/03/23   [provider]  glipiZIDE  2.5 MG TABS Take 1 tablet by mouth daily with breakfast. 02/02/23   Sowles, Krichna, MD  Lancets [facility] DELICA PLUS [place]) MISC CHECK BLOOD SUGAR AT NOON 03/02/23   Sowles, Krichna, MD  levothyroxine  (SYNTHROID ) 25 MCG tablet TAKE 1 TABLET (25 MCG TOTAL) BY MOUTH DAILY BEFORE BREAKFAST. ON SUNDAYS, TAKE 2 TABLETS BY MOUTH BEFORE BREAKFAST. 04/27/23   Sowles, Krichna, MD  losartan  (COZAAR ) 25 MG tablet Take 25 mg by mouth daily. 05/31/23   [provider]  magnesium  oxide (MAG-OX) 400 MG tablet Take 400 mg by mouth daily.    [provider]  metFORMIN  (GLUCOPHAGE -XR) 750 MG 24 hr tablet Take 2 tablets (1,500 mg total) by mouth daily with breakfast. 02/02/23   Sowles, Krichna, MD  Multiple Vitamin (MULTIVITAMIN WITH MINERALS) TABS tablet Take 1 tablet by mouth daily. 05/22/23   Luna Salinas, MD  Nutritional Supplements (THERALITH XR PO) Take by mouth QID. 3.752-45-45-49.5mg . Take 2 in the  evening and 2 at bedtime.    [provider]  ondansetron  (ZOFRAN ) 4 MG tablet Take 1 tablet (4 mg total) by mouth every 6 (six) hours as needed for nausea or vomiting. 05/22/23   Amin, Sumayya, MD  ONETOUCH ULTRA test strip USE AS DIRECTED TO CHECK BLOOD GLUCOSE ONCE DAILY. 05/26/23   Sowles, Krichna, MD  pantoprazole  (PROTONIX ) 40 MG tablet Take 1 tablet (40 mg total) by mouth daily. 11/10/22 11/05/23  Brigitte Canard, PA-C  polyethylene glycol powder (GLYCOLAX /MIRALAX ) 17 GM/SCOOP powder Take 17 g by mouth daily. 11/10/22   Brigitte Canard, PA-C  rOPINIRole  (REQUIP ) 1 MG tablet Take 1 tablet (1 mg total) by mouth 2 (two) times daily. 02/02/23   Sowles, Krichna, MD  senna (SENOKOT) 8.6 MG TABS tablet Take 1 tablet (8.6 mg total) by mouth 2 (two) times daily. 05/22/23   Luna Salinas, MD  spironolactone  (ALDACTONE ) 25 MG  tablet Take 12.5 mg by mouth daily.    Callwood, Dwayne D, MD  tamsulosin  (FLOMAX ) 0.4 MG CAPS capsule TAKE 1 CAPSULE BY MOUTH EVERY EVENING 06/01/23   Stoioff, Kizzie Perks, MD  triamcinolone  (KENALOG ) 0.1 % Apply 1 application topically 2 (two) times daily as needed (rash).    [provider]    Physical Exam: Vitals:   06/22/23 1014 06/22/23 1019 06/22/23 1408  Pulse:  97   Resp:  (!) 26   Temp:  (!) 97.5 F (36.4 C) 97.9 F (36.6 C)  TempSrc:  Oral Oral  SpO2:  100%   Weight: 65.8 kg    Height: 5\' 7"  (1.702 m)      Constitutional: NAD, calm, comfortable Vitals:   06/22/23 1014 06/22/23 1019 06/22/23 1408  Pulse:  97   Resp:  (!) 26   Temp:  (!) 97.5 F (36.4 C) 97.9 F (36.6 C)  TempSrc:  Oral Oral  SpO2:  100%   Weight: 65.8 kg    Height: 5\' 7"  (1.702 m)     Eyes: PERRL, lids and conjunctivae normal ENMT: Mucous membranes are moist. Posterior pharynx clear of any exudate or lesions.Normal dentition.  Neck: normal, supple, no masses, no thyromegaly Respiratory: clear to auscultation bilaterally, no wheezing, no crackles. Normal respiratory effort. No accessory muscle use.  Cardiovascular: Regular rate and rhythm, no murmurs / rubs / gallops. No extremity edema. 2+ pedal pulses. No carotid bruits.  Abdomen: no tenderness, no masses palpated. No hepatosplenomegaly. Bowel sounds positive.  Musculoskeletal: no clubbing / cyanosis. No joint deformity upper and lower extremities. Good ROM, no contractures. Normal muscle tone.  Skin: Unable to examine sacral area, given patient's body habitus will come back to reevaluate Neurologic: CN 2-12 grossly intact. Sensation intact, DTR normal. Strength 5/5 in all 4.  Psychiatric: Normal judgment and insight. Alert and oriented x 3. Normal mood.     Labs on Admission: I have personally reviewed following labs and imaging studies  CBC: Recent Labs  Lab 06/22/23 1018  WBC 8.9  NEUTROABS 6.9  HGB 10.6*  HCT 32.8*  MCV 95.9   PLT 392   Basic Metabolic Panel: Recent Labs  Lab 06/22/23 1018  NA 134*  K 4.3  CL 100  CO2 26  GLUCOSE 359*  BUN 21  CREATININE 0.91  CALCIUM  9.0   GFR: Estimated Creatinine Clearance: 58.2 mL/min (by C-G formula based on SCr of 0.91 mg/dL). Liver Function Tests: Recent Labs  Lab 06/22/23 1018  AST 27  ALT 19  ALKPHOS 87  BILITOT 0.6  PROT 6.5  ALBUMIN 2.5*   Recent Labs  Lab 06/22/23 1212  LIPASE 45   No results for input(s): "AMMONIA" in the last 168 hours. Coagulation Profile: No results for input(s): "INR", "PROTIME" in the last 168 hours. Cardiac Enzymes: No results for input(s): "CKTOTAL", "CKMB", "CKMBINDEX", "TROPONINI" in the last 168 hours. BNP (last 3 results) No results for input(s): "PROBNP" in the last 8760 hours. HbA1C: No results for input(s): "HGBA1C" in the last 72 hours. CBG: No results for input(s): "GLUCAP" in the last 168 hours. Lipid Profile: No results for input(s): "CHOL", "HDL", "LDLCALC", "TRIG", "CHOLHDL", "LDLDIRECT" in the last 72 hours. Thyroid  Function Tests: No results for input(s): "TSH", "T4TOTAL", "FREET4", "T3FREE", "THYROIDAB" in the last 72 hours. Anemia Panel: No results for input(s): "VITAMINB12", "FOLATE", "FERRITIN", "TIBC", "IRON", "RETICCTPCT" in the last 72 hours. Urine analysis:    Component Value Date/Time   COLORURINE YELLOW 06/22/2023 1010   APPEARANCEUR CLEAR 06/22/2023 1010   LABSPEC 1.020 06/22/2023 1010   PHURINE 5.5 06/22/2023 1010   GLUCOSEU 250 (A) 06/22/2023 1010   HGBUR MODERATE (A) 06/22/2023 1010   BILIRUBINUR NEGATIVE 06/22/2023 1010   KETONESUR NEGATIVE 06/22/2023 1010   PROTEINUR 30 (A) 06/22/2023 1010   UROBILINOGEN 0.2 04/16/2008 0202   NITRITE NEGATIVE 06/22/2023 1010   LEUKOCYTESUR SMALL (A) 06/22/2023 1010    Radiological Exams on Admission: CT ABDOMEN PELVIS W CONTRAST Result Date: 06/22/2023 CLINICAL DATA:  Sacral ulcer with purulent discharge EXAM: CT ABDOMEN AND PELVIS WITH  CONTRAST TECHNIQUE: Multidetector CT imaging of the abdomen and pelvis was performed using the standard protocol following bolus administration of intravenous contrast. RADIATION DOSE REDUCTION: This exam was performed according to the departmental dose-optimization program which includes automated exposure control, adjustment of the mA and/or kV according to patient size and/or use of iterative reconstruction technique. CONTRAST:  OMNIPAQUE  IOHEXOL  300 MG/ML  SOLN COMPARISON:  October 08, 2022 FINDINGS: Lower chest: Minimal basilar right lower lobe hypoventilatory chronic atelectatic changes without infiltrates or consolidations Hepatobiliary: No focal liver abnormality is seen. No gallstones, gallbladder wall thickening, or biliary dilatation. Pancreas: Unremarkable. No pancreatic ductal dilatation or surrounding inflammatory changes. Spleen: Normal in size without focal abnormality. Adrenals/Urinary Tract: No adrenal gland masses. Right kidney is unremarkable The left kidney demonstrates a 23 x 6 mm stone in the left renal pelvis and a 7 x 7 mm stone in the left kidney lower pole calices without hydronephrosis these findings are unchanged since prior. Stomach/Bowel: Stomach is within normal limits. Appendix appears normal. No evidence of bowel wall thickening, distention, or inflammatory changes. Abundant residual fecal material colon. Diffuse diverticulosis colon without diverticulitis. Vascular/Lymphatic: No significant vascular findings are present. No enlarged abdominal or pelvic lymph nodes. Reproductive: Prostate is unremarkable. Other: No abdominal wall hernia or abnormality. No abdominopelvic ascites. Musculoskeletal: Sacral ulcer with exposure of the tip of the Cox's correlate with chronic decubitus ulcer and associated osteomyelitis. Findings unchanged since prior examination no evidence of drainable abscesses or fluid collections. IMPRESSION: *Sacral ulcer with exposure of the tip of the Cox's  correlate with chronic decubitus ulcer and associated osteomyelitis. Findings unchanged since prior examination no evidence of drainable abscesses or fluid collections. *Left nephrolithiasis without hydronephrosis. *Abundant residual fecal material colon. *Diffuse diverticulosis colon without diverticulitis. *Aortic atherosclerosis. Electronically Signed   By: Fredrich Jefferson M.D.   On: 06/22/2023 13:28   DG Chest 1 View Result Date: 06/22/2023 CLINICAL DATA:  Weakness. EXAM: CHEST  1 VIEW COMPARISON:  Radiographs 05/13/2023 and 05/10/2023.  CT 05/13/2023. FINDINGS: 1020  hours. Interval extubation. PICC line has been removed. The heart size and mediastinal contours are stable. The lungs appear clear. No significant residual pleural effusions identified. The bones appear unchanged. IMPRESSION: Interval extubation. No evidence of acute cardiopulmonary process. Electronically Signed   By: Elmon Hagedorn M.D.   On: 06/22/2023 11:32    EKG: Independently reviewed.  Sinus rhythm, no acute ST changes  Assessment/Plan Principal Problem:   Pressure ulcer Active Problems:   Stage IV pressure ulcer of sacral region [facility])  (please populate well all problems here in Problem List. (For example, if patient is on BP meds at home and you resume or decide to hold them, it is a problem that needs to be her. 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  Will consult speech therapist  Intractable nauseous vomiting - No clear organic etiology found on CT, abdominal physical exam benign. - As needed  Zofran   Unintentional weight loss Failure to thrive Hypoalbuminemia -Secondary to decreased oral intake probably from dysphagia problem - Start protein supplement  IIDM -SSI  Chronic urinary retention Chronic indwelling Foley catheter - No acute concern, outpatient follow-up with urology for void trial  CKD stage IIIb - Euvolemic, creatinine level stable  Chronic HFrEF - Euvolemic - Continue home BP/CHF medications  PAF - In sinus rhythm - Not on anticoagulation due to history of gross hematuria  Ambulation impairment Bedbound -Neurological exam within normal limits.  However patient does have general weakness on bilateral lower extremities more on the right side. -PT evaluation   DVT prophylaxis: Heparin  subcu Code Status: Full code Family Communication: Wife at bedside Disposition Plan: Patient is sick with sacral stage IV pressure ulcer and osteomyelitis failed outpatient management, requiring IV antibiotics, expect more than 2 midnight hospital stay Consults called: None Admission status: MedSurg admission   [place] MD [facility] Pager [phone]  06/22/2023, 3:38 PM

## 2023-06-23 ENCOUNTER — Inpatient Hospital Stay

## 2023-06-23 DIAGNOSIS — L89150 Pressure ulcer of sacral region, unstageable: Secondary | ICD-10-CM

## 2023-06-23 DIAGNOSIS — J392 Other diseases of pharynx: Secondary | ICD-10-CM | POA: Diagnosis not present

## 2023-06-23 DIAGNOSIS — S12090D Other displaced fracture of first cervical vertebra, subsequent encounter for fracture with routine healing: Secondary | ICD-10-CM | POA: Diagnosis not present

## 2023-06-23 DIAGNOSIS — R531 Weakness: Secondary | ICD-10-CM

## 2023-06-23 DIAGNOSIS — B9562 Methicillin resistant Staphylococcus aureus infection as the cause of diseases classified elsewhere: Secondary | ICD-10-CM

## 2023-06-23 DIAGNOSIS — L089 Local infection of the skin and subcutaneous tissue, unspecified: Secondary | ICD-10-CM | POA: Diagnosis not present

## 2023-06-23 DIAGNOSIS — L89154 Pressure ulcer of sacral region, stage 4: Secondary | ICD-10-CM | POA: Diagnosis not present

## 2023-06-23 DIAGNOSIS — R29898 Other symptoms and signs involving the musculoskeletal system: Secondary | ICD-10-CM | POA: Diagnosis not present

## 2023-06-23 DIAGNOSIS — E43 Unspecified severe protein-calorie malnutrition: Secondary | ICD-10-CM | POA: Insufficient documentation

## 2023-06-23 DIAGNOSIS — R1319 Other dysphagia: Secondary | ICD-10-CM

## 2023-06-23 DIAGNOSIS — B965 Pseudomonas (aeruginosa) (mallei) (pseudomallei) as the cause of diseases classified elsewhere: Secondary | ICD-10-CM | POA: Diagnosis not present

## 2023-06-23 LAB — BASIC METABOLIC PANEL WITH GFR
Anion gap: 9 (ref 5–15)
BUN: 18 mg/dL (ref 8–23)
CO2: 24 mmol/L (ref 22–32)
Calcium: 8.5 mg/dL — ABNORMAL LOW (ref 8.9–10.3)
Chloride: 104 mmol/L (ref 98–111)
Creatinine, Ser: 0.83 mg/dL (ref 0.61–1.24)
GFR, Estimated: >60 mL/min (ref >60)
Glucose, Bld: 123 mg/dL — ABNORMAL HIGH (ref 70–99)
Potassium: 4 mmol/L (ref 3.5–5.1)
Sodium: 137 mmol/L (ref 135–145)

## 2023-06-23 LAB — CBC
HCT: 29.2 % — ABNORMAL LOW (ref 39.0–52.0)
Hemoglobin: 9.8 g/dL — ABNORMAL LOW (ref 13.0–17.0)
MCH: 31.2 pg (ref 26.0–34.0)
MCHC: 33.6 g/dL (ref 30.0–36.0)
MCV: 93 fL (ref 80.0–100.0)
Platelets: 359 10*3/uL (ref 150–400)
RBC: 3.14 MIL/uL — ABNORMAL LOW (ref 4.22–5.81)
RDW: 13.8 % (ref 11.5–15.5)
WBC: 11.9 10*3/uL — ABNORMAL HIGH (ref 4.0–10.5)
nRBC: 0 % (ref 0.0–0.2)

## 2023-06-23 LAB — GLUCOSE, CAPILLARY
Glucose-Capillary: 109 mg/dL — ABNORMAL HIGH (ref 70–99)
Glucose-Capillary: 179 mg/dL — ABNORMAL HIGH (ref 70–99)
Glucose-Capillary: 81 mg/dL (ref 70–99)
Glucose-Capillary: 99 mg/dL (ref 70–99)

## 2023-06-23 MED ORDER — CHLORHEXIDINE GLUCONATE CLOTH 2 % EX PADS
6.0000 | MEDICATED_PAD | Freq: Every day | CUTANEOUS | Status: DC
  Administered 2023-06-23 – 2023-07-07 (×13): 6 via TOPICAL

## 2023-06-23 MED ORDER — VANCOMYCIN HCL 1500 MG/300ML IV SOLN
1500.0000 mg | INTRAVENOUS | Status: DC
  Administered 2023-06-23 – 2023-06-26 (×4): 1500 mg via INTRAVENOUS
  Filled 2023-06-23 (×4): qty 300

## 2023-06-23 MED ORDER — INSULIN GLARGINE-YFGN 100 UNIT/ML ~~LOC~~ SOLN
10.0000 [IU] | Freq: Every day | SUBCUTANEOUS | Status: DC
  Filled 2023-06-23: qty 0.1

## 2023-06-23 MED ORDER — DEXTROSE-SODIUM CHLORIDE 5-0.9 % IV SOLN: INTRAVENOUS | Status: AC

## 2023-06-23 MED ORDER — SODIUM CHLORIDE 0.9 % IV SOLN: INTRAVENOUS | Status: DC

## 2023-06-23 MED ORDER — SODIUM CHLORIDE 0.9 % IV SOLN
2.0000 g | Freq: Three times a day (TID) | INTRAVENOUS | Status: DC
  Administered 2023-06-23 – 2023-06-26 (×8): 2 g via INTRAVENOUS
  Filled 2023-06-23 (×10): qty 12.5

## 2023-06-23 MED ORDER — MEDIHONEY WOUND/BURN DRESSING EX PSTE
1.0000 | PASTE | Freq: Every day | CUTANEOUS | Status: DC
  Administered 2023-06-23 – 2023-07-07 (×15): 1 via TOPICAL
  Filled 2023-06-23: qty 44

## 2023-06-23 NOTE — Evaluation (Signed)
 Physical Therapy Evaluation Patient Details Name: Billy Franco MRN: [id number] DOB: 05-03-1940 Today's Date: 06/23/2023  History of Present Illness  Pt is an 83 y.o. male presenting to hospital with N/V and dehydration; pt also with non-healing sacral wound.  Recent hospitalization s/p fall and s/p C3-5 ACDF 05/11/23 (pt with central cord syndrome).  Pt admitted with infected stage IV sacral ulcer, chronic osteomyelitis, worsening dysphagia, intractable N/V, unintentional weight loss, FTT, and chronic urinary retention.  PMH includes DM, CKD, htn, HLD, CHF, CAD, a-fib, chronic back pain, C3-5 ACDF.  Clinical Impression  Prior to recent medical concerns and hospitalizations, pt was independent with functional mobility; has recently been at [facility] for rehab s/p C3-5 ACDF but limited mobility (bed level) d/t low BP issues.  8/10 sacral pain beginning/end of session at rest (pt pre-medicated with pain medication; pt reporting it felt good to sit on EOB).  Currently pt is max assist x2 for L sidelying to/from sitting and max assist (briefly mod assist) for sitting balance d/t posterior and L lean.  R>L LE weakness noted as well and increased tone R>L LE.  BP L sidelying in bed beginning of session 110/49 (MAP 66) with HR 91 bpm; pt reporting mild dizziness in sitting and BP 92/49 (61) with HR 100 bpm.  Pt would currently benefit from skilled PT to address noted impairments and functional limitations (see below for any additional details).  Upon hospital discharge, pt would benefit from ongoing therapy.     If plan is discharge home, recommend the following: Two people to help with walking and/or transfers;Two people to help with bathing/dressing/bathroom;Assistance with cooking/housework;Assistance with feeding;Assist for transportation;Help with stairs or ramp for entrance;Supervision due to cognitive status   Can travel by private vehicle   No    Equipment Recommendations Other (comment) (TBD at next  facility)  Recommendations for Other Services       Functional Status Assessment Patient has had a recent decline in their functional status and/or demonstrates limited ability to make significant improvements in function in a reasonable and predictable amount of time     Precautions / Restrictions Precautions Precautions: Fall;Cervical Precaution Booklet Issued: No Recall of Precautions/Restrictions: Impaired Precaution/Restrictions Comments: Hard collar when OOB (apply in sitting) Required Braces or Orthoses: Cervical Brace Cervical Brace: Hard collar;Other (comment) Restrictions Weight Bearing Restrictions Per Provider Order: No      Mobility  Bed Mobility Overal bed mobility: Needs Assistance Bed Mobility: Sidelying to Sit, Sit to Sidelying   Sidelying to sit: Max assist, +2 for physical assistance, HOB elevated     Sit to sidelying: Max assist, +2 for physical assistance, HOB elevated General bed mobility comments: assist for trunk and B LE's    Transfers                   General transfer comment: Deferred d/t safety concerns    Ambulation/Gait                  Stairs            Wheelchair Mobility     Tilt Bed    Modified Rankin (Stroke Patients Only)       Balance Overall balance assessment: Needs assistance Sitting-balance support: Single extremity supported, Feet supported Sitting balance-Leahy Scale: Poor Sitting balance - Comments: initial max assist for static sitting balance; progressed to brief moments of mod assist Postural control: Posterior lean, Left lateral lean  Pertinent Vitals/Pain Pain Assessment Pain Assessment: 0-10 Pain Score: 8  Pain Location: sacral wound Pain Descriptors / Indicators: Aching Pain Intervention(s): Limited activity within patient's tolerance, Monitored during session, Premedicated before session, Repositioned    Home Living Family/patient  expects to be discharged to:: Skilled nursing facility                   Additional Comments: Pt has recently been at [facility] receiving rehab.    Prior Function Prior Level of Function : Independent/Modified Independent;Driving;History of Falls (last six months)             Mobility Comments: Prior to admission 04/2023, pt was independent with functional mobility, driving, and was active; home with spouse.  Per chart pt has been bedbound at SNF secondary to BP issues (pt reports last time sitting up was 4-5 days ago) ADLs Comments: Per OT "Independent at true baseline, since SNF has required significant assist for all ADL 2/2 impairments"     Extremity/Trunk Assessment   Upper Extremity Assessment Upper Extremity Assessment: Defer to OT evaluation    Lower Extremity Assessment Lower Extremity Assessment: RLE deficits/detail;LLE deficits/detail RLE Deficits / Details: hip flexion 1/5; knee extension 0/5; knee flexion 2/5;; DF 1/5; mild hip flexion and knee extension contracture; DF to neutral LLE Deficits / Details: hip flexion 3+/5; knee extension 2+/5; knee flexion 3+/5; DF 2+/5; mild hip flexion contracture; knee extension to neutral; DF to neutral    Cervical / Trunk Assessment Cervical / Trunk Assessment: Neck Surgery  Communication   Communication Communication: No apparent difficulties    Cognition Arousal: Alert Behavior During Therapy: WFL for tasks assessed/performed                           PT - Cognition Comments: Impaired awareness of deficits Following commands: Impaired Following commands impaired: Follows one step commands with increased time     Cueing Cueing Techniques: Verbal cues     General Comments General comments (skin integrity, edema, etc.): sacral wound bandaging in place; bandages noted on B feet/heels.  Nursing cleared pt for participation in physical therapy.  Pt agreeable to PT session.  Pt's sister in law present  during session.    Exercises     Assessment/Plan    PT Assessment Patient needs continued PT services  PT Problem List Decreased strength;Decreased range of motion;Decreased activity tolerance;Decreased balance;Decreased mobility;Decreased coordination;Decreased knowledge of use of DME;Decreased safety awareness;Decreased knowledge of precautions;Cardiopulmonary status limiting activity;Impaired sensation;Impaired tone;Decreased skin integrity;Pain       PT Treatment Interventions DME instruction;Functional mobility training;Therapeutic activities;Therapeutic exercise;Balance training;Patient/family education    PT Goals (Current goals can be found in the Care Plan section)  Acute Rehab PT Goals Patient Stated Goal: to improve functional mobility PT Goal Formulation: With patient Time For Goal Achievement: 07/07/23 Potential to Achieve Goals: Fair    Frequency Min 2X/week     Co-evaluation PT/OT/SLP Co-Evaluation/Treatment: Yes Reason for Co-Treatment: Complexity of the patient's impairments (multi-system involvement);For patient/therapist safety;To address functional/ADL transfers PT goals addressed during session: Mobility/safety with mobility;Balance OT goals addressed during session: ADL's and self-care       AM-PAC PT "6 Clicks" Mobility  Outcome Measure Help needed turning from your back to your side while in a flat bed without using bedrails?: Total Help needed moving from lying on your back to sitting on the side of a flat bed without using bedrails?: Total Help needed moving to and from a  bed to a chair (including a wheelchair)?: Total Help needed standing up from a chair using your arms (e.g., wheelchair or bedside chair)?: Total Help needed to walk in hospital room?: Total Help needed climbing 3-5 steps with a railing? : Total 6 Click Score: 6    End of Session Equipment Utilized During Treatment: Cervical collar Activity Tolerance: Patient tolerated treatment  well;No increased pain Patient left: in bed;with call bell/phone within reach;with bed alarm set;with family/visitor present;Other (comment) (pt on L side per pt request; pillows places between/below pt's B LE's and behind trunk for support/comfort) Nurse Communication: Mobility status;Precautions PT Visit Diagnosis: Other abnormalities of gait and mobility (R26.89);History of falling (Z91.81);Other symptoms and signs involving the nervous system (R29.898);Pain Pain - part of body:  (sacral wound pain)    Time: 7829-5621 PT Time Calculation (min) (ACUTE ONLY): 31 min   Charges:   PT Evaluation $PT Eval Low Complexity: 1 Low   PT General Charges $$ ACUTE PT VISIT: 1 Visit        Amador Junes, PT 06/23/23, 4:03 PM

## 2023-06-23 NOTE — Procedures (Signed)
 Modified Barium Swallow Study  Patient Details  Name: Billy Franco MRN: [id number] Date of Birth: 1940/01/30  Today's Date: 06/23/2023  Modified Barium Swallow completed.  Full report located under Chart Review in the Imaging Section.  History of Present Illness Billy Franco is a 83 y.o. 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 Pt and his wife report that spent most of the last 5-6 weeks bedbound, was not able to participate in PT d/t low blood pressure but did participate in PT/OT. He also reported swallowing problems, his diet has been downgraded to soft however family has witnessed several episodes of choking and coughing after eating.  His appetite decreased significantly and estimated at least several pounds of weight loss compared to last month.   Clinical Impression Pt is known to ST services following his ACDF C3-5 (4/21) and intubation (4/23-4/15) and was discharged on 05/21/2023 consuming regular diet with thin liquids.       Upon readmission to [facility] on 06/22/2023 he is on a puree diet with liquid medicines.This writter called his wife to get interim history. She reports that "he has something in his throat and tomorrow he was going to see a throat doctor." With more questions she reports that pt had an appt with Dr [facility]) tomorrow "because something is wrong with his throat, we are not sure if it is where he had his incision (for ACDF) or where he had a blockage in 2015." She reports that he did work with a Human resources officer at [employer] who had downgraded his diet d/t "something wrong with his throat."       Given the above report and  recent surgical history, this writer felt instrumental swallow study would provide the most information.       This Clinical research associate met briefly with pt at bedside prior to transportation arriving. Education provided to pt on current plan. Pt was not able to tolerate sitting upright in bed d/t pain but was hopeful he could sit in fluro chair for short period of time.   Pt arrived to [employer] suite with [place] hard collar in place and was able to tolerate sitting upright in fluro chair without complaint of increased pain.   Pt presents with profound sensorimotor pharyngeal phase dysphagia placing him at high risk for developing aspiraiton pneumonia, dehydration and malnutrition.       Pt's sensory response to boluses is mildly delayed with overall initiation occuring as boluses fall from the vallecula into the pyriform sinuses. As a result, pt with intermittent laryngeal penetration and aspiration prior to the swallow as airway remained open.   Pt's pharyngeal motor response is also impaired. Pt with reduced epiglottic deflection resulting in the epiglottis abutting posterior pharyngeal wall during majority of boluses allowing for laryngeal penetration and silent aspiration during the swallow. In addition, pt with significantly reduced pharyngeal stripping and bolus passage thru the UES resulting in moderate amounts of pharyngeal residue especially with thicker consistencies remaining after the swallow. Pt was not sensate to residue. Pt largely unable to clear residue despite cues for multiple swallows.   At this time, most conservative diet would be NPO with ice chips to facilitate use of swallow musculature.  Information provided to pt's treatment team, including neurosurgery, to aid in developing POC in accordance with pt's desires. ST to follow acutely.  Factors that may increase risk of adverse event in presence of aspiration Billy Franco & Billy Franco 2021): Reduced cognitive function;Frail or  deconditioned;Dependence for feeding and/or oral hygiene;Inadequate oral hygiene;Weak cough;Poor general health and/or compromised immunity;Limited mobility  Swallow Evaluation Recommendations Recommendations: NPO;Ice chips PRN after oral care Medication Administration: Via alternative means Supervision: Full supervision/cueing for swallowing strategies;Full assist for feeding (when consuming ice chips) Swallowing strategies  : Minimize environmental distractions;Slow rate;Small bites/sips Postural changes: Position pt fully upright for meals Oral care recommendations: Oral care before ice chips/water ;Oral care QID (4x/day) Recommended consults:  (Neurosurgery, possible Palliative Care) Caregiver Recommendations: Avoid jello, ice cream, thin soups, popsicles;Remove water  pitcher;Have oral suction available     Billy Franco, M.S., CCC-SLP, Tree surgeon Certified Brain Injury Specialist [facility]  [facility] Office [phone] Ascom [phone] Fax [phone]

## 2023-06-23 NOTE — Evaluation (Signed)
 Occupational Therapy Evaluation Patient Details Name: Billy Franco MRN: [id number] DOB: 12/08/1940 Today's Date: 06/23/2023   History of Present Illness   83 y.o. 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     Clinical Impressions Pt seen for OT evaluation and co-tx with PT to optimize safety for ADL/mobility. Pt alert and eager to participate, very motivated to improve. He states, "I really want to get up and be walking in a few days." Questionable insight into deficits and safety. Per pt, he has essentially been bed bound at rehab 2/2 BP issues, reporting the last time he had help to sit up was 4-5 days ago. Pt now back to hospital with sacral wound. Pt presents to acute OT demonstrating impaired ADL performance and functional mobility 2/2 decreased strength (R worse than L), impaired ROM particularly bad in RLE, decr balance, sensation to the bottom of his feet, and mild dizziness with positional changes (See OT problem list for additional functional deficits). Pt currently requires MAX A +2 for bed mobility, initial MAX A for static sitting balance improving to CGA-MIN A with intermittent post/L lateral lean. Pt tolerated sitting EOB for ~21min and endorsed his sacral wound was less painful in sitting. Pt would benefit from skilled OT services to address noted impairments and functional limitations (see below for any additional details) in order to maximize safety and independence while minimizing falls risk and caregiver burden.      If plan is discharge home, recommend the following:   Two people to help with walking and/or transfers;A lot of help  with bathing/dressing/bathroom;Direct supervision/assist for medications management;Assist for transportation;Assistance with cooking/housework;Help with stairs or ramp for entrance;Assistance with feeding     Functional Status Assessment   Patient has had a recent decline in their functional status and demonstrates the ability to make significant improvements in function in a reasonable and predictable amount of time.     Equipment Recommendations   Other (comment) (defer)      Precautions/Restrictions   Precautions Precautions: Fall;Cervical Precaution Booklet Issued: No Recall of Precautions/Restrictions: Impaired Required Braces or Orthoses: Cervical Brace Cervical Brace: Hard collar;Other (comment) (hard collar for OOB, applied in sitting)     Mobility Bed Mobility Overal bed mobility: Needs Assistance Bed Mobility: Sidelying to Sit, Sit to Sidelying   Sidelying to sit: Max assist, +2 for physical assistance, HOB elevated     Sit to sidelying: Max assist, +2 for physical assistance, HOB elevated      Transfers      General transfer comment: deferred      Balance Overall balance assessment: Needs assistance Sitting-balance support: No upper extremity supported, Feet supported, Feet unsupported, Single extremity supported Sitting balance-Leahy Scale: Poor Sitting balance - Comments: requires initial MAX A for static sitting balance, improving with time to CGA-MIN A with occassional post/L lean/LOB Postural control: Posterior lean, Left lateral lean       ADL either performed or assessed with clinical judgement   ADL Overall ADL's : Needs assistance/impaired   Eating/Feeding Details (indicate cue type and reason): deferred 2/2 SLP consult pending to assess swallow Grooming: Oral care;Sitting;Contact guard assist;Maximal assistance Grooming Details (indicate cue type and reason): Pt unable to maintain grasp on toothbrush,  unable to open toothpaste and  apply to the brush; required hand over hand assist to complete 2/2 decr grasp -- may benefit from U-cuff or built up foam handle Upper Body Bathing: Bed level;Maximal assistance   Lower Body Bathing: Bed level;Maximal assistance   Upper Body Dressing : Bed level;Maximal assistance   Lower Body Dressing: Bed level;Maximal assistance     Toilet Transfer Details (indicate cue type and reason): unsafe to attempt                  Pertinent Vitals/Pain Pain Assessment Pain Assessment: 0-10 Pain Score: 8  Pain Location: sacral wound Pain Descriptors / Indicators: Aching Pain Intervention(s): Limited activity within patient's tolerance, Monitored during session, Premedicated before session, Repositioned     Extremity/Trunk Assessment Upper Extremity Assessment Upper Extremity Assessment: Generalized weakness;Right hand dominant (RUE slightly worse than LUE deficits in strength, bilat decr composite finger flexion, need to further assess tenodesis grip)   Lower Extremity Assessment Lower Extremity Assessment: Defer to PT evaluation;Generalized weakness (RLE strength and ROM worse than LLE)   Cervical / Trunk Assessment Cervical / Trunk Assessment: Neck Surgery   Communication Communication Communication: No apparent difficulties   Cognition Arousal: Alert Behavior During Therapy: WFL for tasks assessed/performed Cognition: Cognition impaired     Awareness: Online awareness impaired, Intellectual awareness intact       OT - Cognition Comments: Decreased awareness of deficits/safety                 Following commands: Impaired Following commands impaired: Follows one step commands with increased time     Cueing  General Comments   Cueing Techniques: Verbal cues  sacral wound bandaging intact, noted bandages on bilat feet/heels           Home Living Family/patient expects to be discharged to:: Skilled nursing facility         Prior  Functioning/Environment      Mobility Comments: Prior to 04/2023 admission, was from home with spouse, indep in all aspects, driving, and working in the yard. Has been at SNF since that admission. Per chart, pt has been bedbound at SNF 2/2 BP issues, reports last time sitting upright was 4-5 days ago. ADLs Comments: Independent at true baseline, since SNF has required significant assist for all ADL 2/2 impairments    OT Problem List: Decreased strength;Decreased coordination;Decreased range of motion;Pain;Impaired sensation;Decreased safety awareness;Impaired tone;Impaired balance (sitting and/or standing);Decreased knowledge of use of DME or AE;Impaired UE functional use   OT Treatment/Interventions: Self-care/ADL training;Therapeutic exercise;Therapeutic activities;Neuromuscular education;Cognitive remediation/compensation;DME and/or AE instruction;Patient/family education;Balance training;Energy conservation;Manual therapy      OT Goals(Current goals can be found in the care plan section)   Acute Rehab OT Goals Patient Stated Goal: get stronger and walk again OT Goal Formulation: With patient Time For Goal Achievement: 07/07/23 Potential to Achieve Goals: Fair ADL Goals Pt Will Perform Eating: sitting;with adaptive utensils;with contact guard assist Pt Will Perform Grooming: sitting;with contact guard assist;with adaptive equipment Pt Will Transfer to Toilet: with max assist;with +2 assist;stand pivot transfer;bedside commode;squat pivot transfer (LRAD)   OT Frequency:  Min 2X/week    Co-evaluation PT/OT/SLP Co-Evaluation/Treatment: Yes Reason for Co-Treatment: Complexity of the patient's impairments (multi-system involvement);For patient/therapist safety;To address functional/ADL transfers PT goals addressed during session: Mobility/safety with mobility;Balance OT goals addressed during session: ADL's and self-care      AM-PAC OT "6 Clicks" Daily Activity     Outcome Measure  Help from another person eating meals?: Total Help from another  person taking care of personal grooming?: A Lot Help from another person toileting, which includes using toliet, bedpan, or urinal?: Total Help from another person bathing (including washing, rinsing, drying)?: A Lot Help from another person to put on and taking off regular upper body clothing?: A Lot Help from another person to put on and taking off regular lower body clothing?: A Lot 6 Click Score: 10   End of Session Nurse Communication: Other (comment) (Pt needs C-collar to go down to swallow study with SLP)  Activity Tolerance: Patient tolerated treatment well Patient left: in bed;with call bell/phone within reach;with nursing/sitter in room;Other (comment) (heels floated and pillow between legs to prevent pressure to knees, transport, SLP, and RN with pt)  OT Visit Diagnosis: Other abnormalities of gait and mobility (R26.89);Hemiplegia and hemiparesis;Feeding difficulties (R63.3) Hemiplegia - Right/Left: Right Hemiplegia - dominant/non-dominant: Dominant Hemiplegia - caused by: Other cerebrovascular disease                Time: 1610-9604 OT Time Calculation (min): 30 min Charges:  OT General Charges $OT Visit: 1 Visit OT Evaluation $OT Eval High Complexity: 1 High  Berenda Breaker., MPH, MS, OTR/L ascom [phone] 06/23/23, 12:08 PM

## 2023-06-23 NOTE — Consult Note (Signed)
 Pharmacy Antibiotic Note  Billy Franco is a 83 y.o. male admitted on 06/22/2023 with Wound infection/Osteomyelitis.  Pharmacy has been consulted for cefepime and vancomycin dosing. Afeb, WBC 8.9, lactic acid 2.   Plan: Scr 0.91>>0.83  -will adjust Cefepime 2 g q12H to Q8H  for Crcl 63.9 ml/min  -will adjust Vancomycin 1250 mg to 1500 mg q24H.  Goal AUC 400-550. Expected AUC: 551 Cmin 11.6 SCr used: 0.83  IBW  Plan to obtain vancomycin level after 4th or 5th dose.   Continue to assess renal fxn, cultures and length of therapy, etc  Height: 5\' 7"  (170.2 cm) Weight: 65.8 kg (145 lb) IBW/kg (Calculated) : 66.1  Temp (24hrs), Avg:98.3 F (36.8 C), Min:97.7 F (36.5 C), Max:99.2 F (37.3 C)  Recent Labs  Lab 06/22/23 1018 06/22/23 1212 06/23/23 0447  WBC 8.9  --  11.9*  CREATININE 0.91  --  0.83  LATICACIDVEN 2.7* 2.0*  --     Estimated Creatinine Clearance: 63.9 mL/min (by C-G formula based on SCr of 0.83 mg/dL).    Allergies  Allergen Reactions   Codeine Rash and Other (See Comments)    Per pt "hard on my kidneys"     Lovastatin Rash and Other (See Comments)   Tape Rash    Antimicrobials this admission: 6/2 cefepime >>  6/2 vancomycin >>   Dose adjustments this admission:   Microbiology results: None  Thank you for allowing pharmacy to be a part of this patient's care.  Alyric Parkin A, PharmD 06/23/2023 11:10 AM

## 2023-06-23 NOTE — Consult Note (Addendum)
 NAME: Billy Franco  DOB: 1940/02/03  MRN: [id number]  Date/Time: 06/23/2023 2:38 PM  REQUESTING PROVIDER: Mindi Alto Subjective:  REASON FOR CONSULT: Sacral decubitus and sacral osteo ? Treysean Franco is a 83 y.o. male with a history of DM, HTN, CAD, with PCI ischemic cardiomyopathy cervical spine C3-C5 ACDF on 05/11/23 for central cord syndrome, cervical myelopathy following a fall , and with incomplete spinal cord injury, necessitating surgery and discharged to SNF presents from SNF with weakness, nausea, poor intake being bed bound and BP dropping when standing up. He apparently had US  abdomen and it showed thickened gall bladder and was sent to [facility]  Vitals 103/51  06/22/23 10:19  Temp 97.5 F (36.4 C) !  Pulse Rate 97  Resp 26 !  SpO2 100 %    Latest Reference Range & Units 06/22/23 10:18  WBC 4.0 - 10.5 K/uL 8.9  Hemoglobin 13.0 - 17.0 g/dL 29.5 (L)  HCT 28.4 - 13.2 % 32.8 (L)  Platelets 150 - 400 K/uL 392  Creatinine 0.61 - 1.24 mg/dL 4.40   CT abdomen showed Sacral ulcer with exposure of the tip of the Coccyx correlate with chronic decubitus ulcer and associated osteomyelitis.there was left nephrolithiasis and stool in the colon Pt started on vanco and cegfepime and I am asked to see the patient for the same On discussing with Pharmacist pt has been on bactrim and levaquin at the SNF for 2 weeks    Past Medical History:  Diagnosis Date   Arthritis    Lower back, hips   Atrial fibrillation (HCC)    CHF (congestive heart failure) (HCC)    CKD (chronic kidney disease), stage III (HCC)    Complication of anesthesia    has "crooked airway"  Diff breathing after extubated (after kidney stone procedure 07/2013)   Diabetes mellitus without complication (HCC)    GERD (gastroesophageal reflux disease)    Hypertension    Hypothyroidism    Nephrolithiasis     Past Surgical History:  Procedure Laterality Date   ANTERIOR CERVICAL DECOMP/DISCECTOMY FUSION N/A 05/11/2023    Procedure: ANTERIOR CERVICAL DECOMPRESSION/DISCECTOMY FUSION 2 LEVELS;  Surgeon: Jodeen Munch, MD;  Location: [facility] ORS;  Service: Neurosurgery;  Laterality: N/A;   CATARACT EXTRACTION W/PHACO Right 05/07/2021   Procedure: CATARACT EXTRACTION PHACO AND INTRAOCULAR LENS PLACEMENT (IOC) RIGHT DIABETIC 8.63 00:56.5;  Surgeon: Clair Crews, MD;  Location: [facility];  Service: Ophthalmology;  Laterality: Right;  Diabetic   CATARACT EXTRACTION W/PHACO Left 05/21/2021   Procedure: CATARACT EXTRACTION PHACO AND INTRAOCULAR LENS PLACEMENT (IOC) LEFT DIABETIC 7.27 01:01.5;  Surgeon: Clair Crews, MD;  Location: [facility];  Service: Ophthalmology;  Laterality: Left;  Diabetic   CORONARY STENT INTERVENTION N/A 03/08/2020   Procedure: CORONARY STENT INTERVENTION;  Surgeon: Antonette Batters, MD;  Location: [facility] INVASIVE CV LAB;  Service: Cardiovascular;  Laterality: N/A;   LEFT HEART CATH AND CORONARY ANGIOGRAPHY Left 03/08/2020   Procedure: LEFT HEART CATH AND CORONARY ANGIOGRAPHY;  Surgeon: Antonette Batters, MD;  Location: [facility] INVASIVE CV LAB;  Service: Cardiovascular;  Laterality: Left;   NASAL FRACTURE SURGERY  1994   URETEROLITHOTOMY Right 2015    Social History   Socioeconomic History   Marital status: Married    Spouse name: [name]    Number of children: 3   Years of education: Not on file   Highest education level: Not on file  Occupational History   Occupation: retired     Comment: IT trainer at a [employer]  Tobacco Use   Smoking status: Never   Smokeless tobacco: Never  Vaping Use   Vaping status: Never Used  Substance and Sexual Activity   Alcohol use: No    Alcohol/week: 0.0 standard drinks of alcohol   Drug use: No   Sexual activity: Not Currently  Other Topics Concern   Not on file  Social History Narrative   Married, had 3 children, one died at a MVA at age 70   Geralynn Knife of a church    Social Drivers of Corporate investment banker  Strain: Low Risk  (01/24/2019)   Overall Financial Resource Strain (CARDIA)    Difficulty of Paying Living Expenses: Not hard at all  Food Insecurity: No Food Insecurity (06/22/2023)   Hunger Vital Sign    Worried About Running Out of Food in the Last Year: Never true    Ran Out of Food in the Last Year: Never true  Transportation Needs: No Transportation Needs (06/22/2023)   PRAPARE - Administrator, Civil Service (Medical): No    Lack of Transportation (Non-Medical): No  Physical Activity: Sufficiently Active (01/24/2019)   Exercise Vital Sign    Days of Exercise per Week: 5 days    Minutes of Exercise per Session: 60 min  Stress: No Stress Concern Present (01/24/2019)   [employer] of Occupational Health - Occupational Stress Questionnaire    Feeling of Stress : Not at all  Social Connections: Socially Integrated (06/22/2023)   Social Connection and Isolation Panel [NHANES]    Frequency of Communication with Friends and Family: Three times a week    Frequency of Social Gatherings with Friends and Family: Three times a week    Attends Religious Services: More than 4 times per year    Active Member of Clubs or Organizations: Yes    Attends Banker Meetings: More than 4 times per year    Marital Status: Married  Catering manager Violence: Not At Risk (06/22/2023)   Humiliation, Afraid, Rape, and Kick questionnaire    Fear of Current or Ex-Partner: No    Emotionally Abused: No    Physically Abused: No    Sexually Abused: No    Family History  Problem Relation Age of Onset   Heart disease Father    Allergies  Allergen Reactions   Codeine Rash and Other (See Comments)    Per pt "hard on my kidneys"     Lovastatin Rash and Other (See Comments)   Tape Rash   I? Current Facility-Administered Medications  Medication Dose Route Frequency Provider Last Rate Last Admin   0.9 %  sodium chloride  infusion   Intravenous Continuous Ree Candy, MD 75 mL/hr at  06/23/23 1254 New Bag at 06/23/23 1254   acetaminophen  (TYLENOL ) tablet 650 mg  650 mg Oral Q6H PRN [place], MD       Or   acetaminophen  (TYLENOL ) suppository 650 mg  650 mg Rectal Q6H PRN [name] T, MD       ceFEPIme (MAXIPIME) 2 g in sodium chloride  0.9 % 100 mL IVPB  2 g Intravenous Q8H Merrill, Kristin A, RPH       Chlorhexidine  Gluconate Cloth 2 % PADS 6 each  6 each Topical Q0600 [place], MD   6 each at 06/23/23 0507   enoxaparin  (LOVENOX ) injection 40 mg  40 mg Subcutaneous Q24H [name] T, MD   40 mg at 06/22/23 1720   feeding supplement (ENSURE PLUS HIGH  PROTEIN) liquid 237 mL  237 mL Oral BID BM [name] T, MD   237 mL at 06/23/23 0910   HYDROmorphone  (DILAUDID ) injection 0.5 mg  0.5 mg Intravenous Q4H PRN [name] T, MD   0.5 mg at 06/23/23 3086   insulin  aspart (novoLOG ) injection 0-15 Units  0-15 Units Subcutaneous TID WC [place], MD   3 Units at 06/23/23 1300   leptospermum manuka honey (MEDIHONEY) paste 1 Application  1 Application Topical Daily Ree Candy, MD   1 Application at 06/23/23 1303   ondansetron  (ZOFRAN ) tablet 4 mg  4 mg Oral Q6H PRN [place], MD       Or   ondansetron  (ZOFRAN ) injection 4 mg  4 mg Intravenous Q6H PRN [name] T, MD       polyethylene glycol (MIRALAX  / GLYCOLAX ) packet 17 g  17 g Oral Daily PRN [place], MD       vancomycin (VANCOREADY) IVPB 1500 mg/300 mL  1,500 mg Intravenous Q24H Merrill, Kristin A, RPH 150 mL/hr at 06/23/23 1257 1,500 mg at 06/23/23 1257     Abtx:  Anti-infectives (From admission, onward)    Start     Dose/Rate Route Frequency Ordered Stop   06/23/23 1800  ceFEPIme (MAXIPIME) 2 g in sodium chloride  0.9 % 100 mL IVPB        2 g 200 mL/hr over 30 Minutes Intravenous Every 8 hours 06/23/23 1110     06/23/23 1200  vancomycin (VANCOREADY) IVPB 1250 mg/250 mL  Status:  Discontinued        1,250 mg 166.7 mL/hr over 90 Minutes Intravenous Every 24 hours 06/22/23 1608 06/23/23 1113    06/23/23 1200  vancomycin (VANCOREADY) IVPB 1500 mg/300 mL        1,500 mg 150 mL/hr over 120 Minutes Intravenous Every 24 hours 06/23/23 1113     06/22/23 2200  ceFEPIme (MAXIPIME) 2 g in sodium chloride  0.9 % 100 mL IVPB  Status:  Discontinued        2 g 200 mL/hr over 30 Minutes Intravenous Every 12 hours 06/22/23 1608 06/23/23 1110   06/22/23 1200  ceFEPIme (MAXIPIME) 2 g in sodium chloride  0.9 % 100 mL IVPB        2 g 200 mL/hr over 30 Minutes Intravenous  Once 06/22/23 1158 06/22/23 1302   06/22/23 1200  vancomycin (VANCOREADY) IVPB 1500 mg/300 mL        1,500 mg 150 mL/hr over 120 Minutes Intravenous  Once 06/22/23 1158 06/22/23 1519       REVIEW OF SYSTEMS:  Const: negative fever, negative chills, some weight loss Eyes: negative diplopia or visual changes, negative eye pain ENT: negative coryza, negative sore throat Resp: negative cough, hemoptysis, dyspnea Cards: negative for chest pain, palpitations, lower extremity edema GU: has foley GI: nausea, poor appetite and constipation Skin: negative for rash and pruritus Heme: negative for easy bruising and gum/nose bleeding MS:  weakness, bed bound Neurolo:weakness legs  Psych: negative for feelings of anxiety, depression  Endocrine: negative for thyroid , diabetes Allergy/Immunology- negative for any medication or food allergies ? Pertinent Positives include : Objective:  VITALS:  BP 116/60 (BP Location: Left Arm)   Pulse 89   Temp 98.4 F (36.9 C) (Oral)   Resp 18   Ht 5\' 7"  (1.702 m)   Wt 65.8 kg   SpO2 100%   BMI 22.71 kg/m  LDA Foley PHYSICAL EXAM:  General: Alert, cooperative, pale frail.  Head: Normocephalic, without obvious abnormality, atraumatic. Eyes: Conjunctivae clear, anicteric sclerae. Pupils are equal ENT Nares normal. No drainage or sinus tenderness. Lips, mucosa, and tongue normal. No Thrush Neck: anterior surgical scar healed well no carotid bruit and no JVD. Back: No CVA  tenderness. Lungs: b/l air entry Heart: s1s2 Abdomen: Soft, non-tender,not distended. Bowel sounds normal. No masses Sacral wound has 60% red base and rest slough/fibrin  Extremities: atraumatic, no cyanosis. No edema. No clubbing  heels     Skin: No rashes or lesions. Or bruising Lymph: Cervical, supraclavicular normal. Neurologic: not examined in detail Pertinent Labs Lab Results CBC    Component Value Date/Time   WBC 11.9 (H) 06/23/2023 0447   RBC 3.14 (L) 06/23/2023 0447   HGB 9.8 (L) 06/23/2023 0447   HGB 11.7 (L) 11/10/2022 1038   HCT 29.2 (L) 06/23/2023 0447   HCT 35.3 (L) 11/10/2022 1038   PLT 359 06/23/2023 0447   PLT 239 11/10/2022 1038   MCV 93.0 06/23/2023 0447   MCV 98 (H) 11/10/2022 1038   MCV 94 08/16/2013 0418   MCH 31.2 06/23/2023 0447   MCHC 33.6 06/23/2023 0447   RDW 13.8 06/23/2023 0447   RDW 12.1 11/10/2022 1038   RDW 13.0 08/16/2013 0418   LYMPHSABS 1.1 06/22/2023 1018   LYMPHSABS 1.0 11/10/2022 1038   LYMPHSABS 0.4 (L) 08/16/2013 0418   MONOABS 0.5 06/22/2023 1018   MONOABS 0.2 08/16/2013 0418   EOSABS 0.2 06/22/2023 1018   EOSABS 0.3 11/10/2022 1038   EOSABS 0.0 08/16/2013 0418   BASOSABS 0.1 06/22/2023 1018   BASOSABS 0.1 11/10/2022 1038   BASOSABS 0.0 08/16/2013 0418       Latest Ref Rng & Units 06/23/2023    4:47 AM 06/22/2023   10:18 AM 05/22/2023    5:07 AM  CMP  Glucose 70 - 99 mg/dL 540  981  191   BUN 8 - 23 mg/dL 18  21  13    Creatinine 0.61 - 1.24 mg/dL 4.78  2.95  6.21   Sodium 135 - 145 mmol/L 137  134  137   Potassium 3.5 - 5.1 mmol/L 4.0  4.3  4.0   Chloride 98 - 111 mmol/L 104  100  106   CO2 22 - 32 mmol/L 24  26  25    Calcium  8.9 - 10.3 mg/dL 8.5  9.0  8.6   Total Protein 6.5 - 8.1 g/dL  6.5    Total Bilirubin 0.0 - 1.2 mg/dL  0.6    Alkaline Phos 38 - 126 U/L  87    AST 15 - 41 U/L  27    ALT 0 - 44 U/L  19        Microbiology: None  IMAGING RESULTS: Ct abdomen and pelvis reviewed -sacral ulcer with  coccyx osteo I have personally reviewed the films ? Impression/Recommendation Multiple pressure wounds Sacrum stage IV- pt has been on levaquin and bactrim for 2 weeks- currently on vanco and cefepime- I did a culture for what its worth to look for MRSA/Pseudomonas.  Will check Sed rate and CRP We may be able to send him on PO The most important aspect of wound healing is off loading, air mattress, topical care, improve nutrition and prevent soiling with stool. Seen by surgical team - no surgical intervention  Cervical myelopathy and central cord syndrome with Acute neurological deficit in April following a fall necessitating ACDF C3-C5 Site healed well  Dizziness, hypotension as per patient in SNF- check cortisol  Anemia  DM management as per primary team?    Neurogenic bladder with foley since 5 weeks ________________________________________________ Discussed with patient, and wife Dr.Fitzgerald will be seeing him tomorrow

## 2023-06-23 NOTE — Consult Note (Signed)
 Consulting Department:  Inpatient medicine  Primary Physician:  Sowles, Krichna, MD  Chief Complaint: Failure to thrive  History of Present Illness: 06/23/2023 Billy Franco is a 83 y.o. male who presents with the chief complaint of previous spinal cord injury status post ACDF with failure to thrive.  Patient has been admitted with multiple pressure ulcers and progressive cachexia.  He had an evaluation was found to have pharyngeal dysfunction.  Patient himself feels that his strength has been stable however his spouse feels that he continues to get weaker overall.  The symptoms are causing a significant impact on the patient's life.   Review of Systems:  A 10 point review of systems is negative, except for the pertinent positives and negatives detailed in the HPI.  Past Medical History: Past Medical History:  Diagnosis Date   Arthritis    Lower back, hips   Atrial fibrillation (HCC)    CHF (congestive heart failure) (HCC)    CKD (chronic kidney disease), stage III (HCC)    Complication of anesthesia    has "crooked airway"  Diff breathing after extubated (after kidney stone procedure 07/2013)   Diabetes mellitus without complication (HCC)    GERD (gastroesophageal reflux disease)    Hypertension    Hypothyroidism    Nephrolithiasis     Past Surgical History: Past Surgical History:  Procedure Laterality Date   ANTERIOR CERVICAL DECOMP/DISCECTOMY FUSION N/A 05/11/2023   Procedure: ANTERIOR CERVICAL DECOMPRESSION/DISCECTOMY FUSION 2 LEVELS;  Surgeon: Jodeen Munch, MD;  Location: [facility];  Service: Neurosurgery;  Laterality: N/A;   CATARACT EXTRACTION W/PHACO Right 05/07/2021   Procedure: CATARACT EXTRACTION PHACO AND INTRAOCULAR LENS PLACEMENT (IOC) RIGHT DIABETIC 8.63 00:56.5;  Surgeon: Clair Crews, MD;  Location: [facility];  Service: Ophthalmology;  Laterality: Right;  Diabetic   CATARACT EXTRACTION W/PHACO Left 05/21/2021   Procedure: CATARACT EXTRACTION  PHACO AND INTRAOCULAR LENS PLACEMENT (IOC) LEFT DIABETIC 7.27 01:01.5;  Surgeon: Clair Crews, MD;  Location: [facility];  Service: Ophthalmology;  Laterality: Left;  Diabetic   CORONARY STENT INTERVENTION N/A 03/08/2020   Procedure: CORONARY STENT INTERVENTION;  Surgeon: Antonette Batters, MD;  Location: [facility];  Service: Cardiovascular;  Laterality: N/A;   LEFT HEART CATH AND CORONARY ANGIOGRAPHY Left 03/08/2020   Procedure: LEFT HEART CATH AND CORONARY ANGIOGRAPHY;  Surgeon: Antonette Batters, MD;  Location: [facility] INV[facility];  Service: Cardiovascular;  Laterality: Left;   NASAL FRACTURE SURGERY  1994   URETEROLITHOTOMY Right 2015    Allergies: Allergies as of 06/22/2023 - Review Complete 06/22/2023  Allergen Reaction Noted   Codeine Rash and Other (See Comments) 07/31/2014   Lovastatin Rash and Other (See Comments) 07/31/2014   Tape Rash 05/01/2021    Medications:  Current Facility-Administered Medications:    0.9 %  sodium chloride  infusion, , Intravenous, Continuous, Ree Candy, MD, Last Rate: 75 mL/hr at 06/23/23 1254, New Bag at 06/23/23 1254   acetaminophen  (TYLENOL ) tablet 650 mg, 650 mg, Oral, Q6H PRN **OR** acetaminophen  (TYLENOL ) suppository 650 mg, 650 mg, Rectal, Q6H PRN, [name] T, MD   ceFEPIme (MAXIPIME) 2 g in sodium chloride  0.9 % 100 mL IVPB, 2 g, Intravenous, Q8H, Merrill, Kristin A, RPH   Chlorhexidine  Gluconate Cloth 2 % PADS 6 each, 6 each, Topical, Q0600, [place], MD, 6 each at 06/23/23 0507   enoxaparin  (LOVENOX ) injection 40 mg, 40 mg, Subcutaneous, Q24H, Zhang, Ping T, MD, 40 mg at 06/22/23 1720   HYDROmorphone  (DILAUDID ) injection 0.5  mg, 0.5 mg, Intravenous, Q4H PRN, [name] T, MD, 0.5 mg at 06/23/23 1610   insulin  aspart (novoLOG ) injection 0-15 Units, 0-15 Units, Subcutaneous, TID WC, [place], MD, 3 Units at 06/23/23 1300   leptospermum manuka honey (MEDIHONEY) paste 1 Application, 1 Application,  Topical, Daily, Ree Candy, MD, 1 Application at 06/23/23 1303   ondansetron  (ZOFRAN ) tablet 4 mg, 4 mg, Oral, Q6H PRN **OR** ondansetron  (ZOFRAN ) injection 4 mg, 4 mg, Intravenous, Q6H PRN, [name] T, MD   polyethylene glycol (MIRALAX  / GLYCOLAX ) packet 17 g, 17 g, Oral, Daily PRN, [name] T, MD   vancomycin (VANCOREADY) IVPB 1500 mg/300 mL, 1,500 mg, Intravenous, Q24H, Scotty Cyphers, RPH, Last Rate: 150 mL/hr at 06/23/23 1257, 1,500 mg at 06/23/23 1257   Social History: Social History   Tobacco Use   Smoking status: Never   Smokeless tobacco: Never  Vaping Use   Vaping status: Never Used  Substance Use Topics   Alcohol use: No    Alcohol/week: 0.0 standard drinks of alcohol   Drug use: No    Family Medical History: Family History  Problem Relation Age of Onset   Heart disease Father     Physical Examination: Vitals:   06/23/23 0835 06/23/23 1600  BP: 116/60 (!) 93/44  Pulse:  89  Resp: 18 20  Temp: 98.4 F (36.9 C) 98.8 F (37.1 C)  SpO2: 100% 99%     General: Patient is well developed, well nourished, calm, collected, and in no apparent distress.  NEUROLOGICAL:  General: In no acute distress.  Awake and conversant.  Strength: His examination is somewhat difficult to perform due to distractibility, however he is at least 4+ throughout the proximal bilateral upper extremities, he does show significant deficit in the bilateral intrinsics and hand grip approximately 2 out of 5 bilaterally.  In the lower extremities he seems to have more brisk movement in the left lower extremity and decreased movement on the right.  Again hard to give a formal MRC grading scale, however it does appear that he has developed spasticity in both his upper and lower extremities  Imaging: DG Swallowing Func-Speech Pathology Result Date: 06/23/2023 Table formatting from the original result was not included. Modified Barium Swallow Study Patient Details Name: Billy Franco  MRN: [id number] Date of Birth: [birth year] Today's Date: 06/23/2023 HPI/PMH: HPI: Ruford Dudzinski is a 83 y.o. 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 Pt and his wife report that spent most of the last 5-6 weeks bedbound, was not able to participate in PT d/t low blood pressure but did participate in PT/OT. He also reported swallowing problems, his diet has been downgraded to soft however family has witnessed several episodes of choking and coughing after eating.  His appetite decreased significantly and estimated at least several pounds of weight loss compared to last month. Clinical Impression: Pt is known to ST services following his ACDF C3-5 (4/21) and intubation (4/23-4/15) and was discharged on 05/21/2023 consuming regular diet with thin liquids.      Upon readmission to [facility] on 06/22/2023 he is on a puree diet with liquid medicines.This writter called his wife to get interim history. She reports that "he has something in his throat  and tomorrow he was going to see a throat doctor." With more questions she reports that pt had an appt with Dr [facility]) tomorrow "because something is wrong with his throat, we are not sure if it is where he had his incision (for ACDF) or where he had a blockage in 2015." She reports that he did work with a Human resources officer at [employer] who had downgraded his diet d/t "something wrong with his throat."      Given the above report and recent surgical history, this writer felt instrumental swallow study would provide the most information.      This Clinical research associate met briefly with pt at bedside prior to transportation arriving. Education provided to  pt on current plan. Pt was not able to tolerate sitting upright in bed d/t pain but was hopeful he could sit in fluro chair for short period of time.  Pt arrived to [employer] suite with [place] hard collar in place and was able to tolerate sitting upright in fluro chair without complaint of increased pain.  Pt presents with profound sensorimotor pharyngeal phase dysphagia placing him at high risk for developing aspiraiton pneumonia, dehydration and malnutrition.      Pt's sensory response to boluses is mildly delayed with overall initiation occuring as boluses fall from the vallecula into the pyriform sinuses. As a result, pt with intermittent laryngeal penetration and aspiration prior to the swallow as airway remained open.  Pt's pharyngeal motor response is also impaired. Pt with reduced epiglottic deflection resulting in the epiglottis abutting posterior pharyngeal wall during majority of boluses allowing for laryngeal penetration and silent aspiration during the swallow. In addition, pt with significantly reduced pharyngeal stripping and bolus passage thru the UES resulting in moderate amounts of pharyngeal residue especially with thicker consistencies remaining after the swallow. Pt was not sensate to residue. Pt largely unable to clear residue despite cues for multiple swallows.  At this time, most conservative diet would be NPO with ice chips to facilitate use of swallow musculature.     Information provided to pt's treatment team, including neurosurgery, to aid in developing POC in accordance with pt's desires. ST to follow acutely. Factors that may increase risk of adverse event in presence of aspiration Roderick Civatte & Jessy Morocco 2021): Factors that may increase risk of adverse event in presence of aspiration Roderick Civatte & Jessy Morocco 2021): Reduced cognitive function; Frail or deconditioned; Dependence for feeding and/or oral hygiene; Inadequate oral hygiene; Weak cough; Poor general health and/or compromised immunity;  Limited mobility Recommendations/Plan: Swallowing Evaluation Recommendations Swallowing Evaluation Recommendations Recommendations: NPO; Ice chips PRN after oral care Medication Administration: Via alternative means Supervision: Full supervision/cueing for swallowing strategies; Full assist for feeding (when consuming ice chips) Swallowing strategies  : Minimize environmental distractions; Slow rate; Small bites/sips Postural changes: Position pt fully upright for meals Oral care recommendations: Oral care before ice chips/water ; Oral care QID (4x/day) Recommended consults: -- (Neurosurgery, possible Palliative Care) Caregiver Recommendations: Avoid jello, ice cream, thin soups, popsicles; Remove water  pitcher; Have oral suction available Treatment Plan Treatment Plan Treatment recommendations: Therapy as outlined in treatment plan below Follow-up recommendations: Skilled nursing-short term rehab (<3 hours/day) Functional status assessment: Patient has had a recent decline in their functional status and/or demonstrates limited ability to make significant improvements in function in a reasonable and predictable amount of time. Treatment frequency: Min 2x/week Treatment duration: 2 weeks Interventions: Patient/family education; Aspiration precaution training Recommendations Recommendations for follow up therapy are one component of a multi-disciplinary discharge planning  process, led by the attending physician.  Recommendations may be updated based on patient status, additional functional criteria and insurance authorization. Assessment: Orofacial Exam: Orofacial Exam Oral Cavity: Oral Hygiene: WFL Oral Cavity - Dentition: Poor condition; Missing dentition Orofacial Anatomy: WFL Oral Motor/Sensory Function: WFL Anatomy: Anatomy: Presence of cervical hardware (ACDF hardware present) Boluses Administered: Boluses Administered Boluses Administered: Thin liquids (Level 0); Mildly thick liquids (Level 2, nectar thick);  Puree  Oral Impairment Domain: Oral Impairment Domain Lip Closure: No labial escape Tongue control during bolus hold: Not tested Bolus preparation/mastication: Slow prolonged chewing/mashing with complete recollection Bolus transport/lingual motion: Slow tongue motion Oral residue: Trace residue lining oral structures Location of oral residue : Tongue Initiation of pharyngeal swallow : Valleculae; Pyriform sinuses  Pharyngeal Impairment Domain: Pharyngeal Impairment Domain Soft palate elevation: No bolus between soft palate (SP)/pharyngeal wall (PW) Laryngeal elevation: Minimal superior movement of thyroid  cartilage with minimal approximation of arytenoids to epiglottic petiole; Partial superior movement of thyroid  cartilage/partial approximation of arytenoids to epiglottic petiole Anterior hyoid excursion: Partial anterior movement Epiglottic movement: Partial inversion; No inversion Laryngeal vestibule closure: Incomplete, narrow column air/contrast in laryngeal vestibule Pharyngeal stripping wave : Present - diminished Pharyngoesophageal segment opening: Minimal distention/minimal duration, marked obstruction of flow Tongue base retraction: Trace column of contrast or air between tongue base and PPW Pharyngeal residue: Majority of contrast within or on pharyngeal structures Location of pharyngeal residue: Valleculae; Pharyngeal wall; Pyriform sinuses  Esophageal Impairment Domain: Esophageal Impairment Domain Esophageal clearance upright position: Esophageal retention Pill: No data recorded Penetration/Aspiration Scale Score: Penetration/Aspiration Scale Score 4.  Material enters airway, CONTACTS cords then ejected out: Thin liquids (Level 0); Puree; Mildly thick liquids (Level 2, nectar thick) 5.  Material enters airway, CONTACTS cords and not ejected out: Thin liquids (Level 0); Mildly thick liquids (Level 2, nectar thick); Puree 6.  Material enters airway, passes BELOW cords then ejected out: Thin liquids  (Level 0); Mildly thick liquids (Level 2, nectar thick); Puree 8.  Material enters airway, passes BELOW cords without attempt by patient to eject out (silent aspiration) : Thin liquids (Level 0); Mildly thick liquids (Level 2, nectar thick); Puree Compensatory Strategies: No data recorded  General Information: Caregiver present: No  Diet Prior to this Study: Dysphagia 1 (pureed); Thin liquids (Level 0)   Temperature : Normal   Respiratory Status: WFL   Supplemental O2: None (Room air)   History of Recent Intubation: No  Behavior/Cognition: Alert; Cooperative; Pleasant mood Self-Feeding Abilities: Dependent for feeding Baseline vocal quality/speech: Normal Volitional Cough: Able to elicit Volitional Swallow: Able to elicit Exam Limitations: No limitations Goal Planning: Prognosis for improved oropharyngeal function: Guarded Barriers to Reach Goals: Time post onset; Severity of deficits; Overall medical prognosis (wound) No data recorded Patient/Family Stated Goal: "are you going to figue out what is going on in my throat" Consulted and agree with results and recommendations: Patient; Physician; Nurse; Dietitian (neurosurgery) Pain: Pain Assessment Pain Assessment: 0-10 Pain Score: 8 Pain Location: sacral wound Pain Descriptors / Indicators: Aching Pain Intervention(s): Limited activity within patient's tolerance; Monitored during session; Premedicated before session; Repositioned End of Session: Start Time:SLP Start Time (ACUTE ONLY): 1138 Stop Time: SLP Stop Time (ACUTE ONLY): 1152 Time Calculation:SLP Time Calculation (min) (ACUTE ONLY): 14 min Charges: SLP Evaluations $ SLP Speech Visit: 1 Visit SLP Evaluations $MBS Swallow: 1 Procedure SLP visit diagnosis: SLP Visit Diagnosis: Dysphagia, pharyngeal phase (R13.13) Past Medical History: Past Medical History: Diagnosis Date  Arthritis   Lower back, hips  Atrial fibrillation (  HCC)   CHF (congestive heart failure) (HCC)   CKD (chronic kidney disease), stage III (HCC)    Complication of anesthesia   has "crooked airway"  Diff breathing after extubated (after kidney stone procedure 07/2013)  Diabetes mellitus without complication (HCC)   GERD (gastroesophageal reflux disease)   Hypertension   Hypothyroidism   Nephrolithiasis  Past Surgical History: Past Surgical History: Procedure Laterality Date  ANTERIOR CERVICAL DECOMP/DISCECTOMY FUSION N/A 05/11/2023  Procedure: ANTERIOR CERVICAL DECOMPRESSION/DISCECTOMY FUSION 2 LEVELS;  Surgeon: Jodeen Munch, MD;  Location: [facility] OR[facility];  Service: Neurosurgery;  Laterality: N/A;  CATARACT EXTRACTION W/PHACO Right 05/07/2021  Procedure: CATARACT EXTRACTION PHACO AND INTRAOCULAR LENS PLACEMENT (IOC) RIGHT DIABETIC 8.63 00:56.5;  Surgeon: Clair Crews, MD;  Location: [facility];  Service: Ophthalmology;  Laterality: Right;  Diabetic  CATARACT EXTRACTION W/PHACO Left 05/21/2021  Procedure: CATARACT EXTRACTION PHACO AND INTRAOCULAR LENS PLACEMENT (IOC) LEFT DIABETIC 7.27 01:01.5;  Surgeon: Clair Crews, MD;  Location: [facility];  Service: Ophthalmology;  Laterality: Left;  Diabetic  CORONARY STENT INTERVENTION N/A 03/08/2020  Procedure: CORONARY STENT INTERVENTION;  Surgeon: Antonette Batters, MD;  Location: [facility];  Service: Cardiovascular;  Laterality: N/A;  LEFT HEART CATH AND CORONARY ANGIOGRAPHY Left 03/08/2020  Procedure: LEFT HEART CATH AND CORONARY ANGIOGRAPHY;  Surgeon: Antonette Batters, MD;  Location: [facility];  Service: Cardiovascular;  Laterality: Left;  NASAL FRACTURE SURGERY  1994  URETEROLITHOTOMY Right 2015 Billy Franco 06/23/2023, 2:11 PM  CT ABDOMEN PELVIS W CONTRAST Result Date: 06/22/2023 CLINICAL DATA:  Sacral ulcer with purulent discharge EXAM: CT ABDOMEN AND PELVIS WITH CONTRAST TECHNIQUE: Multidetector CT imaging of the abdomen and pelvis was performed using the standard protocol following bolus administration of intravenous contrast. RADIATION DOSE REDUCTION: This exam  was performed according to the departmental dose-optimization program which includes automated exposure control, adjustment of the mA and/or kV according to patient size and/or use of iterative reconstruction technique. CONTRAST:  OMNIPAQUE  IOHEXOL  300 MG/ML  SOLN COMPARISON:  October 08, 2022 FINDINGS: Lower chest: Minimal basilar right lower lobe hypoventilatory chronic atelectatic changes without infiltrates or consolidations Hepatobiliary: No focal liver abnormality is seen. No gallstones, gallbladder wall thickening, or biliary dilatation. Pancreas: Unremarkable. No pancreatic ductal dilatation or surrounding inflammatory changes. Spleen: Normal in size without focal abnormality. Adrenals/Urinary Tract: No adrenal gland masses. Right kidney is unremarkable The left kidney demonstrates a 23 x 6 mm stone in the left renal pelvis and a 7 x 7 mm stone in the left kidney lower pole calices without hydronephrosis these findings are unchanged since prior. Stomach/Bowel: Stomach is within normal limits. Appendix appears normal. No evidence of bowel wall thickening, distention, or inflammatory changes. Abundant residual fecal material colon. Diffuse diverticulosis colon without diverticulitis. Vascular/Lymphatic: No significant vascular findings are present. No enlarged abdominal or pelvic lymph nodes. Reproductive: Prostate is unremarkable. Other: No abdominal wall hernia or abnormality. No abdominopelvic ascites. Musculoskeletal: Sacral ulcer with exposure of the tip of the Cox's correlate with chronic decubitus ulcer and associated osteomyelitis. Findings unchanged since prior examination no evidence of drainable abscesses or fluid collections. IMPRESSION: *Sacral ulcer with exposure of the tip of the Cox's correlate with chronic decubitus ulcer and associated osteomyelitis. Findings unchanged since prior examination no evidence of drainable abscesses or fluid collections. *Left nephrolithiasis without  hydronephrosis. *Abundant residual fecal material colon. *Diffuse diverticulosis colon without diverticulitis. *Aortic atherosclerosis. Electronically Signed   By: Fredrich Jefferson M.D.   On: 06/22/2023 13:28   DG Chest 1  View Result Date: 06/22/2023 CLINICAL DATA:  Weakness. EXAM: CHEST  1 VIEW COMPARISON:  Radiographs 05/13/2023 and 05/10/2023.  CT 05/13/2023. FINDINGS: 1020 hours. Interval extubation. PICC line has been removed. The heart size and mediastinal contours are stable. The lungs appear clear. No significant residual pleural effusions identified. The bones appear unchanged. IMPRESSION: Interval extubation. No evidence of acute cardiopulmonary process. Electronically Signed   By: Elmon Hagedorn M.D.   On: 06/22/2023 11:32     I have personally reviewed the images and agree with the above interpretation.  Labs:    Latest Ref Rng & Units 06/23/2023    4:47 AM 06/22/2023   10:18 AM 05/22/2023    5:07 AM  CBC  WBC 4.0 - 10.5 K/uL 11.9  8.9  10.8   Hemoglobin 13.0 - 17.0 g/dL 9.8  11.9  14.7   Hematocrit 39.0 - 52.0 % 29.2  32.8  31.6   Platelets 150 - 400 K/uL 359  392  283       Latest Ref Rng & Units 06/23/2023    4:47 AM 06/22/2023   10:18 AM 05/22/2023    5:07 AM  BMP  Glucose 70 - 99 mg/dL 829  562  130   BUN 8 - 23 mg/dL 18  21  13    Creatinine 0.61 - 1.24 mg/dL 8.65  7.84  6.96   Sodium 135 - 145 mmol/L 137  134  137   Potassium 3.5 - 5.1 mmol/L 4.0  4.3  4.0   Chloride 98 - 111 mmol/L 104  100  106   CO2 22 - 32 mmol/L 24  26  25    Calcium  8.9 - 10.3 mg/dL 8.5  9.0  8.6         Assessment and Plan: Mr. Haynesworth is a pleasant 83 y.o. male with with a history of a upper cervical spinal cord injury status post ACDF.  He has been readmitted from rehab with failure to thrive and progressive cachexia.  Unfortunately has developed worsening bedsores and has had wound healing issues.  He also has been avoiding oral intake.  He had a swallow study today which demonstrated pharyngeal  dysfunction.  He is at risk for pharyngeal dysfunction not only from his deconditioning but from both his spinal cord injury and approach.  Given his postoperative state as well as his spinal cord injury state his nutrition is of significant importance as patients with spinal cord injuries are often catabolic.  I do not feel that he would benefit from any advanced imaging, given his developing spasticity he may benefit from physical medicine rehabilitation consult as his spasticity is likely impacting his ability to ambulate as well as utilizes upper extremities.  He does appear to have good volitional strength in his upper extremities with the exception of his hands, he does appear to have a right sided lower extremity predominant weakness but again this is difficult to grade given the developing spasticity.  I discussed this with his spouse and with the patient himself.  They are discussing placement of a feeding tube for nutrition.  At this time there is no indication for urgent neurosurgical intervention.  Carroll Clamp, MD/MSCR Dept. of Neurosurgery

## 2023-06-23 NOTE — Consult Note (Addendum)
 WOC Nurse Consult Note: Reason for Consult: Consult requested for sacrum and bilat heels.  Performed remotely after review of progress notes and photos in the EMR.  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  This complex medical condition is beyond the scope of practice for WOC nurses.  Secure chat sent to the primary team as follows; "Dr Alva Jewels; I have reviewed the photo of the sacrum wound and the patient has osteomyelitis, according to the CT scan and could benefit from a surgical consult for possible debridement if aggressive plan of care is desired."   Sacrum with chronic Stage 4 pressure injury, 80% slough/eschar, 20% red. Left buttock wound is Unstageable, 100% eschar Left hip is Unstageable, 50% slough/eschar, 50% red Left heel Unstageable with dry yellow slough Right heel Unstageable with dry yellow slough  Pressure Injury POA: Yes Dressing procedure/placement/frequency: Pt is on a low airloss mattress to reduce pressure.  Prevalon boots ordered to reduce pressure to bilat heels.  Topical treatment orders provided for bedside nurses to perform to assist with removal of nonviable tissue as follows:   1. Cleanse sacral wound with Vashe wound cleanser [employer]m Foot [phone]) do not rinse and allow to air dry.  Apply Vashe moistened gauze to wound bed 2 times daily using a Q tip applicator if needed to cover any areas of depth.  Cover with dry gauze and ABD pad or silicone foam whichever is preferred. 2. Apply Medihoney to left buttock and left trochanter and bilat heels Q day, then cover with foam dressings.  Change foam dressings Q 3 days or PRN soiling  Please re-consult if further assistance is needed.  Thank-you,  Wiliam Harder MSN, RN, CWOCN, [place], [place] [phone]

## 2023-06-23 NOTE — Consult Note (Signed)
 Chief Complaint:  Dysphagia with concern for aspiration  Procedure: Gastrostomy tube placement   Referring Provider(s): Dr. Arlene Lacy  Supervising Physician: Elene Griffes  Patient Status: [facility] - In-pt  History of Present Illness: Billy Franco is a 83 y.o. male with a recent history of cervical spine injury s/p anterior cervical discectomy and fusion surgery with Dr. Selinda Dales on 05/11/23. Patient was sent from SNF for evaluation of repeated episodes of nausea/vomiting, difficulty swallowing, and non-healing sacral wound. Per notes, patient with notable decrease in oral intake since transfer to SNF leading to dehydration and severe malnutrition. Patient underwent modified barium swallow with SLP on 6/3  which showed severe pharyngeal musculature weakness with a high risk of aspiration. Patient subsequently made NPO and IR consulted for gastrostomy tube placement. Case and imaging reviewed and approved by Dr. Irine Manning.   Per records, patient has been on ASA and Plavix  at outside facility. Discussed with patient and team the need for 5 day washout prior to g-tube placement.   Patient is currently resting in bed with wife at the bedside. States that he continues to have numbness and tingling in bilateral hands and feet as well as weakness throughout; however he states that he has improved since his surgery. Patient denies any difficulty eating; however wife states he has been having 'throat issues' since going to the SNF and recently had an episode of nausea/vomiting.  Patient is Full Code  Past Medical History:  Diagnosis Date   Arthritis    Lower back, hips   Atrial fibrillation (HCC)    CHF (congestive heart failure) (HCC)    CKD (chronic kidney disease), stage III (HCC)    Complication of anesthesia    has "crooked airway"  Diff breathing after extubated (after kidney stone procedure 07/2013)   Diabetes mellitus without complication (HCC)    GERD (gastroesophageal reflux disease)     Hypertension    Hypothyroidism    Nephrolithiasis     Past Surgical History:  Procedure Laterality Date   ANTERIOR CERVICAL DECOMP/DISCECTOMY FUSION N/A 05/11/2023   Procedure: ANTERIOR CERVICAL DECOMPRESSION/DISCECTOMY FUSION 2 LEVELS;  Surgeon: Jodeen Munch, MD;  Location: [facility] ORS;  Service: Neurosurgery;  Laterality: N/A;   CATARACT EXTRACTION W/PHACO Right 05/07/2021   Procedure: CATARACT EXTRACTION PHACO AND INTRAOCULAR LENS PLACEMENT (IOC) RIGHT DIABETIC 8.63 00:56.5;  Surgeon: Clair Crews, MD;  Location: [facility];  Service: Ophthalmology;  Laterality: Right;  Diabetic   CATARACT EXTRACTION W/PHACO Left 05/21/2021   Procedure: CATARACT EXTRACTION PHACO AND INTRAOCULAR LENS PLACEMENT (IOC) LEFT DIABETIC 7.27 01:01.5;  Surgeon: Clair Crews, MD;  Location: [facility];  Service: Ophthalmology;  Laterality: Left;  Diabetic   CORONARY STENT INTERVENTION N/A 03/08/2020   Procedure: CORONARY STENT INTERVENTION;  Surgeon: Antonette Batters, MD;  Location: [facility] INVASIVE CV LAB;  Service: Cardiovascular;  Laterality: N/A;   LEFT HEART CATH AND CORONARY ANGIOGRAPHY Left 03/08/2020   Procedure: LEFT HEART CATH AND CORONARY ANGIOGRAPHY;  Surgeon: Antonette Batters, MD;  Location: [facility] INVASIVE CV LAB;  Service: Cardiovascular;  Laterality: Left;   NASAL FRACTURE SURGERY  1994   URETEROLITHOTOMY Right 2015    Allergies: Codeine, Lovastatin, and Tape  Medications: Prior to Admission medications   Medication Sig Start Date End Date Taking? Authorizing Provider  acetaminophen  (TYLENOL ) 650 MG CR tablet Take 650 mg by mouth every 8 (eight) hours as needed for pain.   Yes [provider]  aspirin  EC 81 MG tablet Take 1 tablet (81  mg total) by mouth daily. Swallow whole. 05/22/23  Yes Luna Salinas, MD  atorvastatin  (LIPITOR) 10 MG tablet Take 1 tablet (10 mg total) by mouth daily at 6 PM. 02/02/23  Yes Sowles, Krichna, MD  Cholecalciferol (VITAMIN D -1000  MAX ST) 25 MCG (1000 UT) tablet Take 1 tablet by mouth daily.   Yes [provider]  clopidogrel  (PLAVIX ) 75 MG tablet Take 1 tablet (75 mg total) by mouth daily. Please resume from 05/25/2023 05/22/23  Yes Amin, Sumayya, MD  Cyanocobalamin (B-12) 1000 MCG TBCR Take 1,000 mcg by mouth every other day. 01/02/20  Yes [provider]  docusate sodium  (COLACE) 50 MG capsule Take 100 mg by mouth 2 (two) times daily as needed for mild constipation.   Yes [provider]  glipiZIDE  2.5 MG TABS Take 1 tablet by mouth daily with breakfast. 02/02/23  Yes Sowles, Krichna, MD  levofloxacin (LEVAQUIN) 750 MG tablet Take 750 mg by mouth daily.   Yes [provider]  levothyroxine  (SYNTHROID ) 25 MCG tablet TAKE 1 TABLET (25 MCG TOTAL) BY MOUTH DAILY BEFORE BREAKFAST. ON SUNDAYS, TAKE 2 TABLETS BY MOUTH BEFORE BREAKFAST. 04/27/23  Yes Sowles, Krichna, MD  magnesium  oxide (MAG-OX) 400 MG tablet Take 400 mg by mouth daily.   Yes [provider]  metFORMIN  (GLUCOPHAGE -XR) 750 MG 24 hr tablet Take 2 tablets (1,500 mg total) by mouth daily with breakfast. 02/02/23  Yes Sowles, Krichna, MD  midodrine (PROAMATINE) 10 MG tablet Take 10 mg by mouth 3 (three) times daily.   Yes [provider]  Multiple Vitamin (MULTIVITAMIN WITH MINERALS) TABS tablet Take 1 tablet by mouth daily. 05/22/23  Yes Luna Salinas, MD  Nutritional Supplements (THERALITH XR PO) Take 2 tablets by mouth 2 (two) times daily. 3.752-45-45-49.5mg . Take 2 tablets in the evening and 2 tablets at bedtime.   Yes [provider]  ondansetron  (ZOFRAN ) 4 MG tablet Take 1 tablet (4 mg total) by mouth every 6 (six) hours as needed for nausea or vomiting. 05/22/23  Yes Luna Salinas, MD  pantoprazole  (PROTONIX ) 40 MG tablet Take 1 tablet (40 mg total) by mouth daily. 11/10/22 11/05/23 Yes Brigitte Canard, PA-C  polyethylene glycol powder (GLYCOLAX /MIRALAX ) 17 GM/SCOOP powder Take 17 g by mouth daily. 11/10/22  Yes  Brigitte Canard, PA-C  rOPINIRole  (REQUIP ) 2 MG tablet Take 1 tablet by mouth 2 (two) times daily. 05/12/22  Yes [provider]  senna (SENOKOT) 8.6 MG TABS tablet Take 1 tablet (8.6 mg total) by mouth 2 (two) times daily. 05/22/23  Yes Luna Salinas, MD  sodium hypochlorite (DAKIN'S FULL STRENGTH) 0.5 % SOLN Irrigate with 1 Application as directed 2 (two) times daily.   Yes [provider]  sulfamethoxazole-trimethoprim (BACTRIM DS) 800-160 MG tablet Take 1 tablet by mouth 2 (two) times daily.   Yes [provider]  tamsulosin  (FLOMAX ) 0.4 MG CAPS capsule TAKE 1 CAPSULE BY MOUTH EVERY EVENING 06/01/23  Yes Stoioff, Kizzie Perks, MD  traMADol  (ULTRAM ) 50 MG tablet Take 50 mg by mouth every 6 (six) hours as needed for moderate pain (pain score 4-6).   Yes [provider]  triamcinolone  (KENALOG ) 0.1 % Apply 1 application topically 2 (two) times daily as needed (rash).   Yes [provider]  aluminum hydroxide-magnesium  carbonate (GAVISCON) 95-358 MG/15ML SUSP Take by mouth.    [provider]  blood glucose meter kit and supplies 1 each by Other route as directed. Dispense based on patient and insurance preference. Use up to four times  daily as directed. (FOR ICD-10 E10.9, E11.9). 09/09/21   Sowles, Krichna, MD  carvedilol  (COREG ) 3.125 MG tablet Take 3.125 mg by mouth 2 (two) times daily. Patient not taking: Reported on 06/22/2023 05/31/23   [provider]  furosemide  (LASIX ) 40 MG tablet Take 40 mg by mouth daily. Patient not taking: Reported on 06/22/2023 05/03/23   [provider]  Lancets [facility] DELICA PLUS [place]) MISC CHECK BLOOD SUGAR AT NOON 03/02/23   Sowles, Krichna, MD  losartan  (COZAAR ) 25 MG tablet Take 25 mg by mouth daily. Patient not taking: Reported on 06/22/2023 05/31/23   [provider]  ONETOUCH ULTRA test strip USE AS DIRECTED TO CHECK BLOOD GLUCOSE ONCE DAILY. 05/26/23   Sowles, Krichna, MD  spironolactone   (ALDACTONE ) 25 MG tablet Take 12.5 mg by mouth daily. Patient not taking: Reported on 06/22/2023    Antonette Batters, MD     Family History  Problem Relation Age of Onset   Heart disease Father     Social History   Socioeconomic History   Marital status: Married    Spouse name: [name]    Number of children: 3   Years of education: Not on file   Highest education level: Not on file  Occupational History   Occupation: retired     Comment: IT trainer at a [employer]   Tobacco Use   Smoking status: Never   Smokeless tobacco: Never  Vaping Use   Vaping status: Never Used  Substance and Sexual Activity   Alcohol use: No    Alcohol/week: 0.0 standard drinks of alcohol   Drug use: No   Sexual activity: Not Currently  Other Topics Concern   Not on file  Social History Narrative   Married, had 3 children, one died at a MVA at age 30   Geralynn Knife of a church    Social Drivers of Corporate investment banker Strain: Low Risk  (01/24/2019)   Overall Financial Resource Strain (CARDIA)    Difficulty of Paying Living Expenses: Not hard at all  Food Insecurity: No Food Insecurity (06/22/2023)   Hunger Vital Sign    Worried About Running Out of Food in the Last Year: Never true    Ran Out of Food in the Last Year: Never true  Transportation Needs: No Transportation Needs (06/22/2023)   PRAPARE - Administrator, Civil Service (Medical): No    Lack of Transportation (Non-Medical): No  Physical Activity: Sufficiently Active (01/24/2019)   Exercise Vital Sign    Days of Exercise per Week: 5 days    Minutes of Exercise per Session: 60 min  Stress: No Stress Concern Present (01/24/2019)   [employer] of Occupational Health - Occupational Stress Questionnaire    Feeling of Stress : Not at all  Social Connections: Socially Integrated (06/22/2023)   Social Connection and Isolation Panel [NHANES]    Frequency of Communication with Friends and Family: Three times a week    Frequency  of Social Gatherings with Friends and Family: Three times a week    Attends Religious Services: More than 4 times per year    Active Member of Clubs or Organizations: Yes    Attends Banker Meetings: More than 4 times per year    Marital Status: Married     Review of Systems Patient denies any headache, chest pain, shortness of breath, abdominal pain, N/V, or fever/chills. All other systems are negative.   Vital Signs: BP (!) 91/42 (BP Location:  Right Arm)   Pulse 93   Temp 98 F (36.7 C) (Oral)   Resp 18   Ht 5\' 7"  (1.702 m)   Wt 133 lb 1.6 oz (60.4 kg)   SpO2 95%   BMI 20.85 kg/m    Physical Exam Vitals reviewed.  Constitutional:      Appearance: Normal appearance.  HENT:     Head: Normocephalic and atraumatic.     Mouth/Throat:     Mouth: Mucous membranes are moist.     Pharynx: Oropharynx is clear.  Cardiovascular:     Rate and Rhythm: Normal rate and regular rhythm.  Pulmonary:     Effort: Pulmonary effort is normal.     Breath sounds: Normal breath sounds.  Abdominal:     General: Abdomen is flat.     Palpations: Abdomen is soft.  Musculoskeletal:        General: Normal range of motion.     Cervical back: Normal range of motion.  Skin:    General: Skin is warm and dry.  Neurological:     General: No focal deficit present.     Mental Status: He is alert and oriented to person, place, and time.  Psychiatric:        Mood and Affect: Mood normal.        Behavior: Behavior normal.     Imaging: DG Swallowing Func-Speech Pathology Result Date: 06/23/2023 Table formatting from the original result was not included. Modified Barium Swallow Study Patient Details Name: Eben Choinski MRN: [id number] Date of Birth: 11-02-1940 Today's Date: 06/23/2023 HPI/PMH: HPI: Arthur Speagle is a 83 y.o. 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 Pt and his wife report that spent most of the last 5-6 weeks bedbound, was not able to participate in PT d/t low blood pressure but did participate in PT/OT. He also reported swallowing problems, his diet has been downgraded to soft however family has witnessed several episodes of choking and coughing after eating.  His appetite decreased significantly and estimated at least several pounds of weight loss compared to last month. Clinical Impression: Pt is known to ST services following his ACDF C3-5 (4/21) and intubation (4/23-4/15) and was discharged on 05/21/2023 consuming regular diet with thin liquids.      Upon readmission to [facility] on 06/22/2023 he is on a puree diet with liquid medicines.This writter called his wife to get interim history. She reports that "he has something in his throat and tomorrow he was going to see a throat doctor." With more questions she reports that pt had an appt with Dr [facility]) tomorrow "because something is wrong with his throat, we are not sure if it is where he had his incision (for ACDF) or where he had a blockage in 2015." She reports that he did work with a Human resources officer at [employer] who had downgraded his diet d/t "something wrong with his throat."      Given the above report and recent surgical history, this writer felt instrumental swallow study would provide the most information.      This Clinical research associate met briefly with pt at bedside prior to transportation arriving. Education provided to pt on current plan. Pt was not able to tolerate  sitting upright in bed d/t pain but was hopeful he could sit in fluro chair for short period of time.  Pt arrived to [employer] suite with [place] hard collar in place and was able to tolerate sitting upright in fluro  chair without complaint of increased pain.  Pt presents with profound sensorimotor pharyngeal phase dysphagia placing him at high risk for developing aspiraiton pneumonia, dehydration and malnutrition.      Pt's sensory response to boluses is mildly delayed with overall initiation occuring as boluses fall from the vallecula into the pyriform sinuses. As a result, pt with intermittent laryngeal penetration and aspiration prior to the swallow as airway remained open.  Pt's pharyngeal motor response is also impaired. Pt with reduced epiglottic deflection resulting in the epiglottis abutting posterior pharyngeal wall during majority of boluses allowing for laryngeal penetration and silent aspiration during the swallow. In addition, pt with significantly reduced pharyngeal stripping and bolus passage thru the UES resulting in moderate amounts of pharyngeal residue especially with thicker consistencies remaining after the swallow. Pt was not sensate to residue. Pt largely unable to clear residue despite cues for multiple swallows.  At this time, most conservative diet would be NPO with ice chips to facilitate use of swallow musculature.     Information provided to pt's treatment team, including neurosurgery, to aid in developing POC in accordance with pt's desires. ST to follow acutely. Factors that may increase risk of adverse event in presence of aspiration Roderick Civatte & Jessy Morocco 2021): Factors that may increase risk of adverse event in presence of aspiration Roderick Civatte & Jessy Morocco 2021): Reduced cognitive function; Frail or deconditioned; Dependence for feeding and/or oral hygiene; Inadequate oral hygiene; Weak cough; Poor general health and/or compromised immunity; Limited mobility Recommendations/Plan: Swallowing Evaluation Recommendations Swallowing Evaluation Recommendations Recommendations: NPO; Ice chips PRN after oral care Medication Administration: Via alternative means Supervision: Full supervision/cueing for swallowing  strategies; Full assist for feeding (when consuming ice chips) Swallowing strategies  : Minimize environmental distractions; Slow rate; Small bites/sips Postural changes: Position pt fully upright for meals Oral care recommendations: Oral care before ice chips/water ; Oral care QID (4x/day) Recommended consults: -- (Neurosurgery, possible Palliative Care) Caregiver Recommendations: Avoid jello, ice cream, thin soups, popsicles; Remove water  pitcher; Have oral suction available Treatment Plan Treatment Plan Treatment recommendations: Therapy as outlined in treatment plan below Follow-up recommendations: Skilled nursing-short term rehab (<3 hours/day) Functional status assessment: Patient has had a recent decline in their functional status and/or demonstrates limited ability to make significant improvements in function in a reasonable and predictable amount of time. Treatment frequency: Min 2x/week Treatment duration: 2 weeks Interventions: Patient/family education; Aspiration precaution training Recommendations Recommendations for follow up therapy are one component of a multi-disciplinary discharge planning process, led by the attending physician.  Recommendations may be updated based on patient status, additional functional criteria and insurance authorization. Assessment: Orofacial Exam: Orofacial Exam Oral Cavity: Oral Hygiene: WFL Oral Cavity - Dentition: Poor condition; Missing dentition Orofacial Anatomy: WFL Oral Motor/Sensory Function: WFL Anatomy: Anatomy: Presence of cervical hardware (ACDF hardware present) Boluses Administered: Boluses Administered Boluses Administered: Thin liquids (Level 0); Mildly thick liquids (Level 2, nectar thick); Puree  Oral Impairment Domain: Oral Impairment Domain Lip Closure: No labial escape Tongue control during bolus hold: Not tested Bolus preparation/mastication: Slow prolonged chewing/mashing with complete recollection Bolus transport/lingual motion: Slow tongue motion  Oral residue: Trace residue lining oral structures Location of oral residue : Tongue Initiation of pharyngeal swallow : Valleculae; Pyriform sinuses  Pharyngeal Impairment Domain: Pharyngeal  Impairment Domain Soft palate elevation: No bolus between soft palate (SP)/pharyngeal wall (PW) Laryngeal elevation: Minimal superior movement of thyroid  cartilage with minimal approximation of arytenoids to epiglottic petiole; Partial superior movement of thyroid  cartilage/partial approximation of arytenoids to epiglottic petiole Anterior hyoid excursion: Partial anterior movement Epiglottic movement: Partial inversion; No inversion Laryngeal vestibule closure: Incomplete, narrow column air/contrast in laryngeal vestibule Pharyngeal stripping wave : Present - diminished Pharyngoesophageal segment opening: Minimal distention/minimal duration, marked obstruction of flow Tongue base retraction: Trace column of contrast or air between tongue base and PPW Pharyngeal residue: Majority of contrast within or on pharyngeal structures Location of pharyngeal residue: Valleculae; Pharyngeal wall; Pyriform sinuses  Esophageal Impairment Domain: Esophageal Impairment Domain Esophageal clearance upright position: Esophageal retention Pill: No data recorded Penetration/Aspiration Scale Score: Penetration/Aspiration Scale Score 4.  Material enters airway, CONTACTS cords then ejected out: Thin liquids (Level 0); Puree; Mildly thick liquids (Level 2, nectar thick) 5.  Material enters airway, CONTACTS cords and not ejected out: Thin liquids (Level 0); Mildly thick liquids (Level 2, nectar thick); Puree 6.  Material enters airway, passes BELOW cords then ejected out: Thin liquids (Level 0); Mildly thick liquids (Level 2, nectar thick); Puree 8.  Material enters airway, passes BELOW cords without attempt by patient to eject out (silent aspiration) : Thin liquids (Level 0); Mildly thick liquids (Level 2, nectar thick); Puree Compensatory Strategies:  No data recorded  General Information: Caregiver present: No  Diet Prior to this Study: Dysphagia 1 (pureed); Thin liquids (Level 0)   Temperature : Normal   Respiratory Status: WFL   Supplemental O2: None (Room air)   History of Recent Intubation: No  Behavior/Cognition: Alert; Cooperative; Pleasant mood Self-Feeding Abilities: Dependent for feeding Baseline vocal quality/speech: Normal Volitional Cough: Able to elicit Volitional Swallow: Able to elicit Exam Limitations: No limitations Goal Planning: Prognosis for improved oropharyngeal function: Guarded Barriers to Reach Goals: Time post onset; Severity of deficits; Overall medical prognosis (wound) No data recorded Patient/Family Stated Goal: "are you going to figue out what is going on in my throat" Consulted and agree with results and recommendations: Patient; Physician; Nurse; Dietitian (neurosurgery) Pain: Pain Assessment Pain Assessment: 0-10 Pain Score: 8 Pain Location: sacral wound Pain Descriptors / Indicators: Aching Pain Intervention(s): Limited activity within patient's tolerance; Monitored during session; Premedicated before session; Repositioned End of Session: Start Time:SLP Start Time (ACUTE ONLY): 1138 Stop Time: SLP Stop Time (ACUTE ONLY): 1152 Time Calculation:SLP Time Calculation (min) (ACUTE ONLY): 14 min Charges: SLP Evaluations $ SLP Speech Visit: 1 Visit SLP Evaluations $MBS Swallow: 1 Procedure SLP visit diagnosis: SLP Visit Diagnosis: Dysphagia, pharyngeal phase (R13.13) Past Medical History: Past Medical History: Diagnosis Date  Arthritis   Lower back, hips  Atrial fibrillation (HCC)   CHF (congestive heart failure) (HCC)   CKD (chronic kidney disease), stage III (HCC)   Complication of anesthesia   has "crooked airway"  Diff breathing after extubated (after kidney stone procedure 07/2013)  Diabetes mellitus without complication (HCC)   GERD (gastroesophageal reflux disease)   Hypertension   Hypothyroidism   Nephrolithiasis  Past Surgical  History: Past Surgical History: Procedure Laterality Date  ANTERIOR CERVICAL DECOMP/DISCECTOMY FUSION N/A 05/11/2023  Procedure: ANTERIOR CERVICAL DECOMPRESSION/DISCECTOMY FUSION 2 LEVELS;  Surgeon: Jodeen Munch, MD;  Location: [facility] ORS;  Service: Neurosurgery;  Laterality: N/A;  CATARACT EXTRACTION W/PHACO Right 05/07/2021  Procedure: CATARACT EXTRACTION PHACO AND INTRAOCULAR LENS PLACEMENT (IOC) RIGHT DIABETIC 8.63 00:56.5;  Surgeon: Clair Crews, MD;  Location: [facility];  Service: Ophthalmology;  Laterality: Right;  Diabetic  CATARACT EXTRACTION W/PHACO Left 05/21/2021  Procedure: CATARACT EXTRACTION PHACO AND INTRAOCULAR LENS PLACEMENT (IOC) LEFT DIABETIC 7.27 01:01.5;  Surgeon: Clair Crews, MD;  Location: [facility];  Service: Ophthalmology;  Laterality: Left;  Diabetic  CORONARY STENT INTERVENTION N/A 03/08/2020  Procedure: CORONARY STENT INTERVENTION;  Surgeon: Antonette Batters, MD;  Location: [facility] INVASIVE CV LAB;  Service: Cardiovascular;  Laterality: N/A;  LEFT HEART CATH AND CORONARY ANGIOGRAPHY Left 03/08/2020  Procedure: LEFT HEART CATH AND CORONARY ANGIOGRAPHY;  Surgeon: Antonette Batters, MD;  Location: [facility] INVASIVE CV LAB;  Service: Cardiovascular;  Laterality: Left;  NASAL FRACTURE SURGERY  1994  URETEROLITHOTOMY Right 2015 Happi Overton 06/23/2023, 2:11 PM  CT ABDOMEN PELVIS W CONTRAST Result Date: 06/22/2023 CLINICAL DATA:  Sacral ulcer with purulent discharge EXAM: CT ABDOMEN AND PELVIS WITH CONTRAST TECHNIQUE: Multidetector CT imaging of the abdomen and pelvis was performed using the standard protocol following bolus administration of intravenous contrast. RADIATION DOSE REDUCTION: This exam was performed according to the departmental dose-optimization program which includes automated exposure control, adjustment of the mA and/or kV according to patient size and/or use of iterative reconstruction technique. CONTRAST:  OMNIPAQUE  IOHEXOL  300 MG/ML  SOLN  COMPARISON:  October 08, 2022 FINDINGS: Lower chest: Minimal basilar right lower lobe hypoventilatory chronic atelectatic changes without infiltrates or consolidations Hepatobiliary: No focal liver abnormality is seen. No gallstones, gallbladder wall thickening, or biliary dilatation. Pancreas: Unremarkable. No pancreatic ductal dilatation or surrounding inflammatory changes. Spleen: Normal in size without focal abnormality. Adrenals/Urinary Tract: No adrenal gland masses. Right kidney is unremarkable The left kidney demonstrates a 23 x 6 mm stone in the left renal pelvis and a 7 x 7 mm stone in the left kidney lower pole calices without hydronephrosis these findings are unchanged since prior. Stomach/Bowel: Stomach is within normal limits. Appendix appears normal. No evidence of bowel wall thickening, distention, or inflammatory changes. Abundant residual fecal material colon. Diffuse diverticulosis colon without diverticulitis. Vascular/Lymphatic: No significant vascular findings are present. No enlarged abdominal or pelvic lymph nodes. Reproductive: Prostate is unremarkable. Other: No abdominal wall hernia or abnormality. No abdominopelvic ascites. Musculoskeletal: Sacral ulcer with exposure of the tip of the Cox's correlate with chronic decubitus ulcer and associated osteomyelitis. Findings unchanged since prior examination no evidence of drainable abscesses or fluid collections. IMPRESSION: *Sacral ulcer with exposure of the tip of the Cox's correlate with chronic decubitus ulcer and associated osteomyelitis. Findings unchanged since prior examination no evidence of drainable abscesses or fluid collections. *Left nephrolithiasis without hydronephrosis. *Abundant residual fecal material colon. *Diffuse diverticulosis colon without diverticulitis. *Aortic atherosclerosis. Electronically Signed   By: Fredrich Jefferson M.D.   On: 06/22/2023 13:28   DG Chest 1 View Result Date: 06/22/2023 CLINICAL DATA:  Weakness.  EXAM: CHEST  1 VIEW COMPARISON:  Radiographs 05/13/2023 and 05/10/2023.  CT 05/13/2023. FINDINGS: 1020 hours. Interval extubation. PICC line has been removed. The heart size and mediastinal contours are stable. The lungs appear clear. No significant residual pleural effusions identified. The bones appear unchanged. IMPRESSION: Interval extubation. No evidence of acute cardiopulmonary process. Electronically Signed   By: Elmon Hagedorn M.D.   On: 06/22/2023 11:32    Labs:  CBC: Recent Labs    05/20/23 0545 05/22/23 0507 06/22/23 1018 06/23/23 0447  WBC 10.6* 10.8* 8.9 11.9*  HGB 10.9* 10.4* 10.6* 9.8*  HCT 33.1* 31.6* 32.8* 29.2*  PLT 258 283 392 359    COAGS: Recent Labs    05/10/23 2309  INR  1.0  APTT 29    BMP: Recent Labs    05/22/23 0507 06/22/23 1018 06/23/23 0447 06/24/23 0256  NA 137 134* 137 136  K 4.0 4.3 4.0 3.7  CL 106 100 104 105  CO2 25 26 24 23   GLUCOSE 111* 359* 123* 130*  BUN 13 21 18 18   CALCIUM  8.6* 9.0 8.5* 8.9  CREATININE 0.96 0.91 0.83 0.78  GFRNONAA >60 >60 >60 >60    LIVER FUNCTION TESTS: Recent Labs    05/10/23 1621 05/13/23 1605 06/22/23 1018 06/24/23 0256  BILITOT 1.0 1.0 0.6 1.1  AST 30 40 27 38  ALT 22 13 19 22   ALKPHOS 57 49 87 79  PROT 7.1 5.8* 6.5 6.2*  ALBUMIN 4.0 2.8* 2.5* 2.2*    TUMOR MARKERS: No results for input(s): "AFPTM", "CEA", "CA199", "CHROMGRNA" in the last 8760 hours.  Assessment and Plan:  Neurogenic dysphagia with subsequent malnutrition in the setting of acute illness: Jaequan Propes is a 83 y.o. male with a history of recent cervical spine injury s/p ACDF surgery who presents to [facility] with worsening dysphagia, likely secondary to cervical spine injury and subsequent neurosurgery procedures, and malnutrition. Interventional Radiology consulted for image-guided gastrostomy tube placement. Procedure to be performed under moderate sedation.    -Patient on ASA and Plavix  at outside facility; last dose on  6/1 -Patient and family aware that procedure will likely take place on 6/6 to allow for washout -Patient does not wish to have NG tube placed in the interim -labs for morning of 6/6 -Patient remains NPO w/ ice chips  -Will plan for gastrostomy tube placement on 6/6  Thank you for allowing our service to participate in Marsalis Beaulieu 's care.    Electronically Signed: Jamea Robicheaux M Linnea Todisco, PA-C   06/23/2023, 3:15PM     I spent a total of 55 Mi[place] in face to face in clinical consultation, greater than 50% of which was counseling/coordinating care for gastrostomy tube placement.

## 2023-06-23 NOTE — Progress Notes (Signed)
 Initial Nutrition Assessment  DOCUMENTATION CODES:   Severe malnutrition in context of acute illness/injury  INTERVENTION:   Once feeding tube in place:  Osmolite 1.5@65ml /hr- Initiate at 27ml/hr and increase by 10ml/hr q 8 hours until goal rate is reached.   Free water  flushes 50ml q4 hours to maintain tube patency   Regimen provides 2340kcal/day, 98g/day protein and 1419ml/day of free water .   Pt at high refeed risk; recommend monitor potassium, magnesium  and phosphorus labs daily until stable  Juven Fruit Punch BID via tube, each serving provides 95kcal and 2.5g of protein (amino acids glutamine and arginine)  Daily weights   NUTRITION DIAGNOSIS:   Severe Malnutrition related to acute illness as evidenced by severe fat depletion, severe muscle depletion, 11 percent weight loss in 5 months.  GOAL:   Patient will meet greater than or equal to 90% of their needs  MONITOR:   Labs, Weight trends, I & O's, Skin, Diet advancement, TF tolerance  REASON FOR ASSESSMENT:   Consult Assessment of nutrition requirement/status  ASSESSMENT:   83 y/o male with h/o GERD, chronic back pain, DM, HTN, Afib, RLS, CAD s/p PCI and stent, CKD III, CHF with EF 25-30%, refused AICD, ischemic cardiomyopathy, gout and recent admission for cervical stenosis and myelopathy after fall s/p C3-5 ACDF 4/21 complicated by acute metabolic encephalopathy with acute hypoxic respiratory failure due to presumed aspiration requiring intubation and mechanical ventilation for airway protection and who is now admitted for FTT (with dysphagia, nausea and vomiting) and osteomyeltitis sacral wound.  Met with pt and pt's wife in room today. Pt is well known to this RD from a recent previous admission. History provided by patient and wife at bedside. Pt reports that he has been at SNF for ~ 4 weeks. Pt reports difficulty with swallowing and food getting stuck in his throat since discharge from the hospital. Pt has been  having nausea and vomiting for the past week. Wife reports that patient has not eaten hardly anything over the past week. Per chart, pt is down 17lbs(11%) over the past 5 months with majority of that occurring over the past month. Pt with increased muscle and fat depletions on exam today and meets criteria for malnutrition. Pt noted to have developed several wounds over the past month. Pt s/p MBSS today and remains NPO. Plan is for IR G-tube placement. Recommend NGT placement and nutrition support if G-tube is not going to be placed within the next 48hrs; this was discussed with patient and wife who are in agreement. Pt is at high refeed risk. Will transition to bolus feeds if plan is for patient to discharge home.   Medications reviewed and include: lovenox , insulin , NaCl @75ml /hr, cefepime, vancomycin   Labs reviewed: K 4.0 wnl BNP- 203.8(H)- 6/2 WBC- 11.9(H), Hgb 9.8(L), Hct 29.2(L) Cbgs- 179, 109 x 24 hrs  AIC 7.8(H)- 1/13  NUTRITION - FOCUSED PHYSICAL EXAM:  Flowsheet Row Most Recent Value  Orbital Region Moderate depletion  Upper Arm Region Severe depletion  Thoracic and Lumbar Region Severe depletion  Buccal Region Severe depletion  Temple Region Severe depletion  Clavicle Bone Region Severe depletion  Clavicle and Acromion Bone Region Severe depletion  Dorsal Hand Severe depletion  Patellar Region Severe depletion  Anterior Thigh Region Severe depletion  Posterior Calf Region Severe depletion  Edema (RD Assessment) None  Hair Reviewed  Eyes Reviewed  Mouth Reviewed  Skin Reviewed  Nails Reviewed   Diet Order:   Diet Order  Diet NPO time specified Except for: Ice Chips  Diet effective now                  EDUCATION NEEDS:   Education needs have been addressed  Skin:  Skin Assessment: Reviewed RN Assessment  -Sacrum with chronic Stage 4 pressure injury -Left buttock wound is Unstageable -Left hip is Unstageable -Left heel Unstageable  -Right heel  Unstageable   Last BM:  5/29- constipation  Height:   Ht Readings from Last 1 Encounters:  06/22/23 5\' 7"  (1.702 m)    Weight:   Wt Readings from Last 1 Encounters:  06/23/23 60.4 kg    Ideal Body Weight:  67.2 kg  BMI:  Body mass index is 20.85 kg/m.  Estimated Nutritional Needs:   Kcal:  1900-2200kcal/day  Protein:  95-110g/day  Fluid:  1.6-1.8L/day  Torrance Freestone MS, RD, LDN If unable to be reached, please send secure chat to "RD inpatient" available from 8:00a-4:00p daily

## 2023-06-23 NOTE — Progress Notes (Addendum)
 PROGRESS NOTE  Billy Franco    DOB: April 03, 1940, 83 y.o.  ZOX:[id number]    Code Status: Full Code   DOA: 06/22/2023   LOS: 1   Brief hospital course  Billy Franco is a 83 y.o. 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 Upon SLP, barium swallow study- patient has severe pharyngeal musculature weakness and high risk of aspiration so made NPO and will be pursuing PEG- has ultimately been very malnourished and had significant weight loss which impacts wound healing and ability to work with PT to increase mobility.  Also reports orthostatic hypotension impacting PT participation and was inexplicably on multiple antihypertensives and midodrine simultaneously. Holding all meds he is on borderline lower limits of normal BP and was able to participate with PT.    ED Course: Afebrile, nontachycardic nonhypotensive nonhypoxic.  CT abdominal pelvis showed no acute findings seen stomach.  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  Blood work showed BUN 13 creatinine 0.9.   Initial treatment included vancomycin and cefepime.  Assessment & Plan  Principal Problem:   Pressure ulcer Active Problems:   Stage IV pressure ulcer of sacral region (HCC)  Stage IV sacral ulcer  sacral osteomyelitis- refer to clinical images and WOC note description on 6/3 for further details.  - frequent turning of patient - mobilize as much as possible - surgical evaluation and yielded no debridement. Conservative management including Abx  recommended.  - wound care as recommended by [facility] team - RD consulted as patient is significantly malnourished.  - will obtain ID consult to guide duration/progression of Abx treatment.    Neurogenic Dysphagia, worsening- recently discharged on full diet and at SNF declined to tolerating pureed diet only. Still having aspirations. SLP evaluated with barium swallow 6/3 and has SEVERE pharyngeal phase dysphagia. Severely high risk of aspiration. Made him NPO immediately and started on IV fluids while awaiting evaluation for PEG placement. Discussed this plan with patient and wife who are agreeable to this plan. His dysphagia is not likely to be reversible in short term or ever since I suspect related to innervation dysfunction from recent cervical spine compression and neurosurgery procedure sequelae.  - I would continue to allow ice chips to continue to promote use of swallow musculature but otherwise NPO to avoid aspiration.  - neurosurgery consulted to evaluate and reviewed swallow study, Dr Felipe Horton with see patient to discuss if further evaluation/intervention is needed. Appreciate your care.  - continue C collar when active - mIVF - IR consulted for PEG evaluation. Patient and wife confirmed they want to pursue patient full scope of care. He was independent and ambulatory prior to central cord syndrome and anterior cervical discectomy and fusion - SLP eval, appreciate your care - RD following for nutritional management once PEG placed   Unintentional weight loss  Failure to thrive- in setting of malnutrition, dysphagia - treat as above - PT/OT, RD  Orthostatic hypotension- per report, this is why patient had not been receiving regular PT and subsequently had regression in his mobility since dc. Reviewing his medication list, he was on midodrine as well as multiple antihypertensives  simultaneously. Since admission, his BP meds are being held and BP is on borderline low but asymptomatic at rest.  -  plan is to continue holding antihypertensives and midodrine. As her works with PT, if experiencing orthostatic symptoms/vitals, can titrate back on midodrine as needed. Monitoring for now.   Anemia- baseline hgb around 10.5. is 9.8 today. In setting of several open wounds and malnutrition. No clear signs of bleeding - obtain anemia panel am.    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  VTE ppx: enoxaparin  (LOVENOX ) injection 40 mg Start: 06/22/23 1800  Diet:     Diet   DIET - DYS 1 Room service appropriate? Yes; Fluid consistency: Thin   Consultants: IR ID General surgery  Neurosurgery   Subjective 06/23/23    Pt reports feeling overall alright. Has generalized weakness and difficulty swallowing. Concerns about his throat.    Objective   Blood pressure (!) 100/41, pulse 89, temperature 99.2 F (37.3 C), temperature source Oral, resp. rate 20, height 5\' 7"  (1.702 m), weight 65.8 kg, SpO2 99%.  Intake/Output Summary (Last 24 hours) at 06/23/2023 0724 Last data filed at 06/23/2023 0300 Gross per 24 hour  Intake 2099 ml  Output 1500 ml  Net 599 ml   Filed Weights   06/22/23 1014  Weight: 65.8 kg     Physical Exam:  General: awake, alert, NAD. Malnourished, elderly male HEENT: atraumatic, clear conjunctiva, anicteric sclera, MMM, hearing grossly normal. Hoarse voice Respiratory: normal respiratory effort. Cardiovascular: quick capillary refill, normal S1/S2, RRR, no JVD, murmurs Gastrointestinal: soft, NT, ND Nervous: A&O x3. All limbs moving spontaneously Extremities: moves all equally, no edema, low  muscle tone Skin: see clinical images for details of wounds on hip/sacrum, heels Psychiatry: normal mood, congruent affect  Labs   I have personally reviewed the following labs and imaging studies CBC    Component Value Date/Time   WBC 11.9 (H) 06/23/2023 0447   RBC 3.14 (L) 06/23/2023 0447   HGB 9.8 (L) 06/23/2023 0447   HGB 11.7 (L) 11/10/2022 1038   HCT 29.2 (L) 06/23/2023 0447   HCT 35.3 (L) 11/10/2022 1038   PLT 359 06/23/2023 0447   PLT 239 11/10/2022 1038   MCV 93.0 06/23/2023 0447   MCV 98 (H) 11/10/2022 1038   MCV 94 08/16/2013 0418   MCH 31.2 06/23/2023 0447   MCHC 33.6 06/23/2023 0447   RDW 13.8 06/23/2023 0447   RDW 12.1 11/10/2022 1038   RDW 13.0 08/16/2013 0418   LYMPHSABS 1.1 06/22/2023 1018   LYMPHSABS 1.0 11/10/2022 1038   LYMPHSABS 0.4 (L) 08/16/2013 0418   MONOABS 0.5 06/22/2023 1018   MONOABS 0.2 08/16/2013 0418   EOSABS 0.2 06/22/2023 1018   EOSABS 0.3 11/10/2022 1038   EOSABS 0.0 08/16/2013 0418   BASOSABS 0.1 06/22/2023 1018   BASOSABS 0.1 11/10/2022 1038   BASOSABS 0.0 08/16/2013 0418      Latest Ref Rng & Units 06/23/2023    4:47 AM 06/22/2023   10:18 AM 05/22/2023    5:07 AM  BMP  Glucose 70 - 99 mg/dL 132  359  111   BUN 8 - 23 mg/dL 18  21  13    Creatinine 0.61 - 1.24 mg/dL 4.09  8.11  9.14   Sodium 135 - 145 mmol/L 137  134  137   Potassium 3.5 - 5.1 mmol/L 4.0  4.3  4.0   Chloride 98 - 111 mmol/L 104  100  106   CO2 22 - 32 mmol/L 24  26  25    Calcium  8.9 - 10.3 mg/dL 8.5  9.0  8.6     CT ABDOMEN PELVIS W CONTRAST Result Date: 06/22/2023 CLINICAL DATA:  Sacral ulcer with purulent discharge EXAM: CT ABDOMEN AND PELVIS WITH CONTRAST TECHNIQUE: Multidetector CT imaging of the abdomen and pelvis was performed using the standard protocol following bolus administration of intravenous contrast. RADIATION DOSE REDUCTION: This exam was performed according to the departmental dose-optimization program which includes automated exposure control,  adjustment of the mA and/or kV according to patient size and/or use of iterative reconstruction technique. CONTRAST:  OMNIPAQUE  IOHEXOL  300 MG/ML  SOLN COMPARISON:  October 08, 2022 FINDINGS: Lower chest: Minimal basilar right lower lobe hypoventilatory chronic atelectatic changes without infiltrates or consolidations Hepatobiliary: No focal liver abnormality is seen. No gallstones, gallbladder wall thickening, or biliary dilatation. Pancreas: Unremarkable. No pancreatic ductal dilatation or surrounding inflammatory changes. Spleen: Normal in size without focal abnormality. Adrenals/Urinary Tract: No adrenal gland masses. Right kidney is unremarkable The left kidney demonstrates a 23 x 6 mm stone in the left renal pelvis and a 7 x 7 mm stone in the left kidney lower pole calices without hydronephrosis these findings are unchanged since prior. Stomach/Bowel: Stomach is within normal limits. Appendix appears normal. No evidence of bowel wall thickening, distention, or inflammatory changes. Abundant residual fecal material colon. Diffuse diverticulosis colon without diverticulitis. Vascular/Lymphatic: No significant vascular findings are present. No enlarged abdominal or pelvic lymph nodes. Reproductive: Prostate is unremarkable. Other: No abdominal wall hernia or abnormality. No abdominopelvic ascites. Musculoskeletal: Sacral ulcer with exposure of the tip of the Cox's correlate with chronic decubitus ulcer and associated osteomyelitis. Findings unchanged since prior examination no evidence of drainable abscesses or fluid collections. IMPRESSION: *Sacral ulcer with exposure of the tip of the Cox's correlate with chronic decubitus ulcer and associated osteomyelitis. Findings unchanged since prior examination no evidence of drainable abscesses or fluid collections. *Left nephrolithiasis without hydronephrosis. *Abundant residual fecal material colon. *Diffuse diverticulosis colon without diverticulitis. *Aortic  atherosclerosis. Electronically Signed   By: Fredrich Jefferson M.D.   On: 06/22/2023 13:28   DG Chest 1 View Result Date: 06/22/2023 CLINICAL DATA:  Weakness. EXAM: CHEST  1 VIEW COMPARISON:  Radiographs 05/13/2023 and 05/10/2023.  CT 05/13/2023. FINDINGS: 1020 hours. Interval extubation. PICC line has been removed. The heart size and mediastinal contours are stable. The lungs appear clear. No significant residual pleural effusions identified. The bones appear unchanged. IMPRESSION: Interval extubation. No evidence of acute cardiopulmonary process. Electronically Signed   By: Elmon Hagedorn M.D.   On: 06/22/2023 11:32    Disposition Plan & Communication  Patient status: Inpatient  Admitted From: SNF Planned disposition location: Skilled nursing facility Anticipated discharge date: TBD pending further medical workup   Family Communication: wife at bedside on multiple encounters    Author: Ree Candy, DO [facility] 06/23/2023, 7:24 AM   Available by Epic secure chat 7AM-7PM. If 7PM-7AM, please contact night-coverage.  [facility] contact information found on [email or web address].

## 2023-06-23 NOTE — Consult Note (Signed)
 [facility] SURGICAL CONSULTATION NOTE (initial) - cpt: 16109   HISTORY OF PRESENT ILLNESS (HPI):  83 y.o. male presented to [facility] ED yesterday from SNF secondary to nausea and emesis over the last 7 days. 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 He was discharged to SNF on 05/02. He reports that his sacral wound has been present "for a few weeks now." He reports being on Abx for this at SNF but is unable to verbalize which. He reports that he has been very week since the fall and has not been able to ambulate since the fall. He states he has been getting local wound care at SNF but unsure to what extent and how often. No reported fevers. He does have indwelling foley catheter secondary to retention. Additionally with a history of CKD, hypertension, hyperlipidemia, CHF with EF of 25 to 30%, CAD, A-fib not on anticoagulation. Work up in the ED revealed a normal WBC to 8.9K, Hgb to 10.6, sCr - 0.91, venous lactate 2.7 (most recent 2.0). He did get CT Abdomen/Pelvis which was concerning for known sacral wound and possible underlying osteomyelitis. Currently on Cefepime and Vancomycin.   Surgery is consulted by hospitalist physician Dr. Evette Hoes, MD in this context for evaluation and management of sacral ulceration.   PAST MEDICAL HISTORY (PMH):  Past Medical History:  Diagnosis Date   Arthritis    Lower back, hips   Atrial fibrillation (HCC)    CHF (congestive heart failure) (HCC)    CKD (chronic kidney disease), stage III (HCC)    Complication of anesthesia    has "crooked airway"  Diff breathing after extubated (after kidney stone procedure 07/2013)   Diabetes mellitus without complication (HCC)    GERD (gastroesophageal reflux disease)    Hypertension    Hypothyroidism    Nephrolithiasis      PAST SURGICAL HISTORY (PSH):  Past Surgical History:  Procedure Laterality Date    ANTERIOR CERVICAL DECOMP/DISCECTOMY FUSION N/A 05/11/2023   Procedure: ANTERIOR CERVICAL DECOMPRESSION/DISCECTOMY FUSION 2 LEVELS;  Surgeon: Jodeen Munch, MD;  Location: [facility] ORS;  Service: Neurosurgery;  Laterality: N/A;   CATARACT EXTRACTION W/PHACO Right 05/07/2021   Procedure: CATARACT EXTRACTION PHACO AND INTRAOCULAR LENS PLACEMENT (IOC) RIGHT DIABETIC 8.63 00:56.5;  Surgeon: Clair Crews, MD;  Location: [facility];  Service: Ophthalmology;  Laterality: Right;  Diabetic   CATARACT EXTRACTION W/PHACO Left 05/21/2021   Procedure: CATARACT EXTRACTION PHACO AND INTRAOCULAR LENS PLACEMENT (IOC) LEFT DIABETIC 7.27 01:01.5;  Surgeon: Clair Crews, MD;  Location: [facility];  Service: Ophthalmology;  Laterality: Left;  Diabetic   CORONARY STENT INTERVENTION N/A 03/08/2020   Procedure: CORONARY STENT INTERVENTION;  Surgeon: Antonette Batters, MD;  Location: [facility] INVASIVE CV LAB;  Service: Cardiovascular;  Laterality: N/A;   LEFT HEART CATH AND CORONARY ANGIOGRAPHY Left 03/08/2020   Procedure: LEFT HEART CATH AND CORONARY ANGIOGRAPHY;  Surgeon: Antonette Batters, MD;  Location: [facility] INVASIVE CV LAB;  Service: Cardiovascular;  Laterality: Left;   NASAL FRACTURE SURGERY  1994   URETEROLITHOTOMY Right 2015     MEDICATIONS:  Prior to Admission medications   Medication Sig Start Date End Date Taking? Authorizing Provider  acetaminophen  (TYLENOL ) 650 MG CR tablet Take 650 mg by mouth every 8 (eight) hours as needed for pain.   Yes [provider]  aspirin  EC 81 MG tablet Take 1 tablet (81 mg total) by mouth daily. Swallow whole.  05/22/23  Yes Luna Salinas, MD  atorvastatin  (LIPITOR) 10 MG tablet Take 1 tablet (10 mg total) by mouth daily at 6 PM. 02/02/23  Yes Sowles, Krichna, MD  Cholecalciferol (VITAMIN D -1000 MAX ST) 25 MCG (1000 UT) tablet Take 1 tablet by mouth daily.   Yes [provider]  clopidogrel  (PLAVIX ) 75 MG tablet Take 1 tablet (75 mg total) by  mouth daily. Please resume from 05/25/2023 05/22/23  Yes Amin, Sumayya, MD  Cyanocobalamin (B-12) 1000 MCG TBCR Take 1,000 mcg by mouth every other day. 01/02/20  Yes [provider]  docusate sodium  (COLACE) 50 MG capsule Take 100 mg by mouth 2 (two) times daily as needed for mild constipation.   Yes [provider]  glipiZIDE  2.5 MG TABS Take 1 tablet by mouth daily with breakfast. 02/02/23  Yes Sowles, Krichna, MD  levofloxacin (LEVAQUIN) 750 MG tablet Take 750 mg by mouth daily.   Yes [provider]  levothyroxine  (SYNTHROID ) 25 MCG tablet TAKE 1 TABLET (25 MCG TOTAL) BY MOUTH DAILY BEFORE BREAKFAST. ON SUNDAYS, TAKE 2 TABLETS BY MOUTH BEFORE BREAKFAST. 04/27/23  Yes Sowles, Krichna, MD  magnesium  oxide (MAG-OX) 400 MG tablet Take 400 mg by mouth daily.   Yes [provider]  metFORMIN  (GLUCOPHAGE -XR) 750 MG 24 hr tablet Take 2 tablets (1,500 mg total) by mouth daily with breakfast. 02/02/23  Yes Sowles, Krichna, MD  midodrine (PROAMATINE) 10 MG tablet Take 10 mg by mouth 3 (three) times daily.   Yes [provider]  Multiple Vitamin (MULTIVITAMIN WITH MINERALS) TABS tablet Take 1 tablet by mouth daily. 05/22/23  Yes Luna Salinas, MD  Nutritional Supplements (THERALITH XR PO) Take 2 tablets by mouth 2 (two) times daily. 3.752-45-45-49.5mg . Take 2 tablets in the evening and 2 tablets at bedtime.   Yes [provider]  ondansetron  (ZOFRAN ) 4 MG tablet Take 1 tablet (4 mg total) by mouth every 6 (six) hours as needed for nausea or vomiting. 05/22/23  Yes Luna Salinas, MD  pantoprazole  (PROTONIX ) 40 MG tablet Take 1 tablet (40 mg total) by mouth daily. 11/10/22 11/05/23 Yes Brigitte Canard, PA-C  polyethylene glycol powder (GLYCOLAX /MIRALAX ) 17 GM/SCOOP powder Take 17 g by mouth daily. 11/10/22  Yes Brigitte Canard, PA-C  rOPINIRole  (REQUIP ) 2 MG tablet Take 1 tablet by mouth 2 (two) times daily. 05/12/22  Yes [provider]  senna (SENOKOT) 8.6 MG  TABS tablet Take 1 tablet (8.6 mg total) by mouth 2 (two) times daily. 05/22/23  Yes Luna Salinas, MD  sodium hypochlorite (DAKIN'S FULL STRENGTH) 0.5 % SOLN Irrigate with 1 Application as directed 2 (two) times daily.   Yes [provider]  sulfamethoxazole-trimethoprim (BACTRIM DS) 800-160 MG tablet Take 1 tablet by mouth 2 (two) times daily.   Yes [provider]  tamsulosin  (FLOMAX ) 0.4 MG CAPS capsule TAKE 1 CAPSULE BY MOUTH EVERY EVENING 06/01/23  Yes Stoioff, Kizzie Perks, MD  traMADol  (ULTRAM ) 50 MG tablet Take 50 mg by mouth every 6 (six) hours as needed for moderate pain (pain score 4-6).   Yes [provider]  triamcinolone  (KENALOG ) 0.1 % Apply 1 application topically 2 (two) times daily as needed (rash).   Yes [provider]  aluminum hydroxide-magnesium  carbonate (GAVISCON) 95-358 MG/15ML SUSP Take by mouth.    [provider]  blood glucose meter kit and supplies 1 each by Other route as directed. Dispense based on patient and insurance preference. Use up to four times daily as directed. (FOR ICD-10 E10.9, E11.9).  09/09/21   Sowles, Krichna, MD  carvedilol  (COREG ) 3.125 MG tablet Take 3.125 mg by mouth 2 (two) times daily. Patient not taking: Reported on 06/22/2023 05/31/23   [provider]  furosemide  (LASIX ) 40 MG tablet Take 40 mg by mouth daily. Patient not taking: Reported on 06/22/2023 05/03/23   [provider]  Lancets [facility] DELICA PLUS [place]) MISC CHECK BLOOD SUGAR AT NOON 03/02/23   Sowles, Krichna, MD  losartan  (COZAAR ) 25 MG tablet Take 25 mg by mouth daily. Patient not taking: Reported on 06/22/2023 05/31/23   [provider]  ONETOUCH ULTRA test strip USE AS DIRECTED TO CHECK BLOOD GLUCOSE ONCE DAILY. 05/26/23   Sowles, Krichna, MD  spironolactone  (ALDACTONE ) 25 MG tablet Take 12.5 mg by mouth daily. Patient not taking: Reported on 06/22/2023    Burney Carter D, MD     ALLERGIES:  Allergies  Allergen  Reactions   Codeine Rash and Other (See Comments)    Per pt "hard on my kidneys"     Lovastatin Rash and Other (See Comments)   Tape Rash     SOCIAL HISTORY:  Social History   Socioeconomic History   Marital status: Married    Spouse name: Sport and exercise psychologist    Number of children: 3   Years of education: Not on file   Highest education level: Not on file  Occupational History   Occupation: retired     Comment: IT trainer at a Clinical [employer]   Tobacco Use   Smoking status: Never   Smokeless tobacco: Never  Vaping Use   Vaping status: Never Used  Substance and Sexual Activity   Alcohol use: No    Alcohol/week: 0.0 standard drinks of alcohol   Drug use: No   Sexual activity: Not Currently  Other Topics Concern   Not on file  Social History Narrative   Married, had 3 children, one died at a MVA at age 12   Geralynn Knife of a church    Social Drivers of Corporate investment banker Strain: Low Risk  (01/24/2019)   Overall Financial Resource Strain (CARDIA)    Difficulty of Paying Living Expenses: Not hard at all  Food Insecurity: No Food Insecurity (06/22/2023)   Hunger Vital Sign    Worried About Running Out of Food in the Last Year: Never true    Ran Out of Food in the Last Year: Never true  Transportation Needs: No Transportation Needs (06/22/2023)   PRAPARE - Administrator, Civil Service (Medical): No    Lack of Transportation (Non-Medical): No  Physical Activity: Sufficiently Active (01/24/2019)   Exercise Vital Sign    Days of Exercise per Week: 5 days    Minutes of Exercise per Session: 60 min  Stress: No Stress Concern Present (01/24/2019)   [employer] of Occupational Health - Occupational Stress Questionnaire    Feeling of Stress : Not at all  Social Connections: Socially Integrated (06/22/2023)   Social Connection and Isolation Panel [NHANES]    Frequency of Communication with Friends and Family: Three times a week    Frequency of Social Gatherings with Friends and  Family: Three times a week    Attends Religious Services: More than 4 times per year    Active Member of Clubs or Organizations: Yes    Attends Banker Meetings: More than 4 times per year    Marital Status: Married  Catering manager Violence: Not At Risk (06/22/2023)   Humiliation, Afraid, Rape, and Kick  questionnaire    Fear of Current or Ex-Partner: No    Emotionally Abused: No    Physically Abused: No    Sexually Abused: No     FAMILY HISTORY:  Family History  Problem Relation Age of Onset   Heart disease Father       REVIEW OF SYSTEMS:  Review of Systems  Constitutional:  Negative for chills and fever.  Cardiovascular:  Negative for chest pain and palpitations.  Gastrointestinal:  Negative for abdominal pain, nausea and vomiting.  Genitourinary:  Negative for dysuria and urgency.       + retention   Musculoskeletal:  Positive for falls (History of) and neck pain.  Skin:  Negative for itching and rash.       + Sacral Ulcer  All other systems reviewed and are negative.   VITAL SIGNS:  Temp:  [97.7 F (36.5 C)-99.2 F (37.3 C)] 98.4 F (36.9 C) (06/03 0835) Pulse Rate:  [89-98] 89 (06/03 0342) Resp:  [18-20] 18 (06/03 0835) BP: (100-116)/(41-60) 116/60 (06/03 0835) SpO2:  [94 %-100 %] 100 % (06/03 0835)     Height: 5\' 7"  (170.2 cm) Weight: 65.8 kg BMI (Calculated): 22.71   INTAKE/OUTPUT:  06/02 0701 - 06/03 0700 In: 2099 [IV Piggyback:2099] Out: 1500 [Urine:1500]  PHYSICAL EXAM:  Physical Exam Vitals and nursing note reviewed. Exam conducted with a chaperone present.  Constitutional:      General: He is not in acute distress.    Appearance: Normal appearance. He is normal weight.     Interventions: Cervical collar in place.     Comments: Resting in bed; NAD  HENT:     Head: Normocephalic and atraumatic.  Eyes:     General: No scleral icterus.    Conjunctiva/sclera: Conjunctivae normal.  Neck:     Comments: Cervical Collar in place   Cardiovascular:     Rate and Rhythm: Normal rate.     Pulses: Normal pulses.  Pulmonary:     Effort: Pulmonary effort is normal. No respiratory distress.  Genitourinary:    Comments: Foley in place; good UO Skin:    General: Skin is warm and dry.     Findings: Wound present.     Comments: Stage 4 sacral ulceration, wound bed with palpable bone, otherwise appears healthy without necrosis or undrained collections   Neurological:     General: No focal deficit present.     Mental Status: He is alert and oriented to person, place, and time. Mental status is at baseline.  Psychiatric:        Mood and Affect: Mood normal.        Behavior: Behavior normal.     Sacral Wound (06/23/2023):    Labs:     Latest Ref Rng & Units 06/23/2023    4:47 AM 06/22/2023   10:18 AM 05/22/2023    5:07 AM  CBC  WBC 4.0 - 10.5 K/uL 11.9  8.9  10.8   Hemoglobin 13.0 - 17.0 g/dL 9.8  16.1  09.6   Hematocrit 39.0 - 52.0 % 29.2  32.8  31.6   Platelets 150 - 400 K/uL 359  392  283       Latest Ref Rng & Units 06/23/2023    4:47 AM 06/22/2023   10:18 AM 05/22/2023    5:07 AM  CMP  Glucose 70 - 99 mg/dL 045  409  811   BUN 8 - 23 mg/dL 18  21  13    Creatinine 0.61 - 1.24  mg/dL 4.16  6.06  3.01   Sodium 135 - 145 mmol/L 137  134  137   Potassium 3.5 - 5.1 mmol/L 4.0  4.3  4.0   Chloride 98 - 111 mmol/L 104  100  106   CO2 22 - 32 mmol/L 24  26  25    Calcium  8.9 - 10.3 mg/dL 8.5  9.0  8.6   Total Protein 6.5 - 8.1 g/dL  6.5    Total Bilirubin 0.0 - 1.2 mg/dL  0.6    Alkaline Phos 38 - 126 U/L  87    AST 15 - 41 U/L  27    ALT 0 - 44 U/L  19       Imaging studies:   CT Abdomen/Pelvis (06/22/2023) personally review and agree with radiologist report reviewed below: IMPRESSION: *Sacral ulcer with exposure of the tip of the Cox's correlate with chronic decubitus ulcer and associated osteomyelitis. Findings unchanged since prior examination no evidence of drainable abscesses or fluid collections. *Left  nephrolithiasis without hydronephrosis. *Abundant residual fecal material colon. *Diffuse diverticulosis colon without diverticulitis. *Aortic atherosclerosis.   Assessment/Plan:  83 y.o. 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   - Wound bed itself appears healthy with very scant fibrinous slough on the edges. There is bone palpable centrally. There is no evidence of necrosis or undrained collections. I do not think this warrants further excisional debridement at this time.   - Would continue local wound care as outline by [facility] RN. Agree with BID wet-to-dry dressings with Vase. Cover and secure. This can be done BID and as needed.   - Educated on the importance of pressure off-loading and the etiology of these ulcers. Pressure offload q2 hours. Consider low air loss mattress.   - Agree with Abx - Pain control prn  - Okay to work with therapy from surgical perspective.  - Further management per primary service; we will be available if needs change.    All of the above findings and recommendations were discussed with the patient, and all of patient's questions were answered to his expressed satisfaction.  Thank you for the opportunity to participate in this patient's care.   -- Apolonio Bay, PA-C [facility] 06/23/2023, 12:16 PM M-F: 7am - 4pm

## 2023-06-24 ENCOUNTER — Other Ambulatory Visit: Payer: Self-pay

## 2023-06-24 DIAGNOSIS — E43 Unspecified severe protein-calorie malnutrition: Secondary | ICD-10-CM

## 2023-06-24 LAB — COMPREHENSIVE METABOLIC PANEL WITH GFR
ALT: 22 U/L (ref 0–44)
AST: 38 U/L (ref 15–41)
Albumin: 2.2 g/dL — ABNORMAL LOW (ref 3.5–5.0)
Alkaline Phosphatase: 79 U/L (ref 38–126)
Anion gap: 8 (ref 5–15)
BUN: 18 mg/dL (ref 8–23)
CO2: 23 mmol/L (ref 22–32)
Calcium: 8.9 mg/dL (ref 8.9–10.3)
Chloride: 105 mmol/L (ref 98–111)
Creatinine, Ser: 0.78 mg/dL (ref 0.61–1.24)
GFR, Estimated: >60 mL/min (ref >60)
Glucose, Bld: 130 mg/dL — ABNORMAL HIGH (ref 70–99)
Potassium: 3.7 mmol/L (ref 3.5–5.1)
Sodium: 136 mmol/L (ref 135–145)
Total Bilirubin: 1.1 mg/dL (ref 0.0–1.2)
Total Protein: 6.2 g/dL — ABNORMAL LOW (ref 6.5–8.1)

## 2023-06-24 LAB — RETICULOCYTES
Immature Retic Fract: 20.9 % — ABNORMAL HIGH (ref 2.3–15.9)
RBC.: 3.33 MIL/uL — ABNORMAL LOW (ref 4.22–5.81)
Retic Count, Absolute: 61.6 10*3/uL (ref 19.0–186.0)
Retic Ct Pct: 1.9 % (ref 0.4–3.1)

## 2023-06-24 LAB — SEDIMENTATION RATE: Sed Rate: >140 mm/h — ABNORMAL HIGH (ref 0–20)

## 2023-06-24 LAB — FERRITIN: Ferritin: 312 ng/mL (ref 24–336)

## 2023-06-24 LAB — PHOSPHORUS: Phosphorus: 3.1 mg/dL (ref 2.5–4.6)

## 2023-06-24 LAB — VITAMIN B12: Vitamin B-12: 1026 pg/mL — ABNORMAL HIGH (ref 180–914)

## 2023-06-24 LAB — GLUCOSE, CAPILLARY
Glucose-Capillary: 117 mg/dL — ABNORMAL HIGH (ref 70–99)
Glucose-Capillary: 116 mg/dL — ABNORMAL HIGH (ref 70–99)
Glucose-Capillary: 114 mg/dL — ABNORMAL HIGH (ref 70–99)
Glucose-Capillary: 128 mg/dL — ABNORMAL HIGH (ref 70–99)

## 2023-06-24 LAB — C-REACTIVE PROTEIN: CRP: 8.1 mg/dL — ABNORMAL HIGH (ref <1.0)

## 2023-06-24 LAB — IRON AND TIBC
Iron: 16 ug/dL — ABNORMAL LOW (ref 45–182)
Saturation Ratios: 8 % — ABNORMAL LOW (ref 17.9–39.5)
TIBC: 190 ug/dL — ABNORMAL LOW (ref 250–450)
UIBC: 174 ug/dL

## 2023-06-24 LAB — CORTISOL: Cortisol, Plasma: 17.1 ug/dL

## 2023-06-24 LAB — MAGNESIUM: Magnesium: 1.4 mg/dL — ABNORMAL LOW (ref 1.7–2.4)

## 2023-06-24 LAB — FOLATE: Folate: 9.1 ng/mL (ref >5.9)

## 2023-06-24 MED ORDER — MAGNESIUM SULFATE 4 GM/100ML IV SOLN
4.0000 g | Freq: Once | INTRAVENOUS | Status: AC
  Administered 2023-06-24: 4 g via INTRAVENOUS
  Filled 2023-06-24: qty 100

## 2023-06-24 MED ORDER — CEFAZOLIN SODIUM-DEXTROSE 2-4 GM/100ML-% IV SOLN
2.0000 g | INTRAVENOUS | Status: AC
  Administered 2023-06-26: 2 g via INTRAVENOUS
  Filled 2023-06-24: qty 100

## 2023-06-24 MED ORDER — BISACODYL 10 MG RE SUPP
10.0000 mg | Freq: Every day | RECTAL | Status: DC | PRN
  Administered 2023-06-24: 10 mg via RECTAL
  Filled 2023-06-24: qty 1

## 2023-06-24 MED ORDER — DEXTROSE-SODIUM CHLORIDE 5-0.9 % IV SOLN: INTRAVENOUS | Status: AC

## 2023-06-24 NOTE — Plan of Care (Signed)
  Problem: Coping: Goal: Ability to adjust to condition or change in health will improve Outcome: Progressing   Problem: Education: Goal: Knowledge of General Education information will improve Description: Including pain rating scale, medication(s)/side effects and non-pharmacologic comfort measures Outcome: Progressing   Problem: Elimination: Goal: Will not experience complications related to bowel motility Outcome: Progressing Goal: Will not experience complications related to urinary retention Outcome: Progressing   Problem: Pain Managment: Goal: General experience of comfort will improve and/or be controlled Outcome: Progressing   Problem: Safety: Goal: Ability to remain free from injury will improve Outcome: Progressing

## 2023-06-24 NOTE — Progress Notes (Signed)
   Inpatient Rehab Admissions Coordinator :  Per therapy recommendations patient was screened for CIR candidacy by Jeannetta Millman RN MSN. Patient is not at a level to tolerate the intensity required to pursue a CIR admit . Noted last admit that he was assessed for CIR admit by Dr Raynaldo Call on 05/19/23. Wife can only provide light assist and patient will require at home heavy assist for all adls. Recommend pursue other rehab venues. Please contact me with any questions.  Jeannetta Millman RN MSN Admissions Coordinator [phone]

## 2023-06-24 NOTE — Progress Notes (Signed)
 Speech Language Pathology Treatment: Dysphagia  Patient Details Name: Billy Franco MRN: [id number] DOB: August 27, 1940 Today's Date: 06/24/2023 Time: 7829-5621 SLP Time Calculation (min) (ACUTE ONLY): 13 min  Assessment / Plan / Recommendation Clinical Impression  This writer met with pt and his wife to review yesterday's Modified Barium Swallow Study. This Clinical research associate provided education on pharyngeal phase impairment, silent aspiration risk, current diet recommendation of NPO except for ice chips following oral care. Extensive education also provided on adhering to strict aspiration precautions when consuming ice chips. All questions were answered to their satisfaction. ST services will sign off at this time.    HPI HPI: Billy Franco is a 83 y.o. 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 Pt and his wife report that spent most of the last 5-6 weeks bedbound, was not able to participate in PT d/t low blood pressure but did participate in PT/OT. He also reported swallowing problems, his diet has been downgraded to soft however family has witnessed several episodes of choking and coughing after eating.  His appetite decreased significantly and estimated at least several pounds of weight loss compared to last month.      SLP Plan  All goals met      Recommendations for follow up therapy are one component of a multi-disciplinary discharge planning process, led by the attending physician.  Recommendations may be updated based on patient status, additional functional criteria and insurance authorization.    Recommendations  Diet recommendations: NPO (allow ice chips following oral  care with under strict aspiration precautions) Liquids provided via: Teaspoon Medication Administration: Via alternative means Compensations: Minimize environmental distractions;Slow rate;Small sips/bites Postural Changes and/or Swallow Maneuvers: Seated upright 90 degrees;Upright 30-60 min after meal                  Oral care QID;Oral care prior to ice chip/H20   Frequent or constant Supervision/Assistance Dysphagia, pharyngeal phase (R13.13)     All goals met    Yanette Tripoli B. Garlin Junker, M.S., CCC-SLP, Tree surgeon Certified Brain Injury Specialist [facility]  [facility] Rehabilitation Services Office [phone] Ascom [phone] Fax [phone]

## 2023-06-24 NOTE — Progress Notes (Signed)
 Progress Note   Patient: Billy Franco AOZ:[id number] DOB: 06-19-1940 DOA: 06/22/2023     2 DOS: the patient was seen and examined on 06/24/2023   Brief hospital course: Zanden Colver is a 83 y.o. 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 Upon SLP, barium swallow study- patient has severe pharyngeal musculature weakness and high risk of aspiration so made NPO and will be pursuing PEG- has ultimately been very malnourished and had significant weight loss which impacts wound healing and ability to work with PT to increase mobility.  Also reports orthostatic hypotension impacting PT participation and was inexplicably on multiple antihypertensives and midodrine simultaneously. Holding all meds he is on borderline lower limits of normal BP and was able to participate with PT.    ED Course: Afebrile, nontachycardic nonhypotensive nonhypoxic.  CT abdominal pelvis showed no acute findings seen stomach.  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  Blood work showed BUN 13 creatinine 0.9.   Initial treatment included vancomycin and cefepime.  Assessment and Plan:   Stage IV sacral ulcer  sacral osteomyelitis- refer to clinical images and WOC note description on 6/3 for further details.  Continue antibiotics - wound care as recommended by [facility] team - RD consulted as patient is significantly malnourished.  - Infectious disease on board PICC line requested   Severe malnutrition Dietitian on board  Neurogenic Dysphagia,  worsening- recently discharged on full diet and at SNF declined to tolerating pureed diet only. Still having aspirations. SLP evaluated with barium swallow 6/3 and has SEVERE pharyngeal phase dysphagia. Severely high risk of aspiration. Made him NPO immediately and started on IV fluids while awaiting evaluation for PEG placement. Discussed this plan with patient and wife who are agreeable to this plan. His dysphagia is not likely to be reversible in short term or ever since I suspect related to innervation dysfunction from recent cervical spine compression and neurosurgery procedure sequelae.  - I would continue to allow ice chips to continue to promote use of swallow musculature but otherwise NPO to avoid aspiration.  - neurosurgery consulted to evaluate and reviewed swallow study, Dr Felipe Horton with see patient to discuss if further evaluation/intervention is needed. Continue supplemental IV fluid IR considering placement of PEG tube after Eliquis  washout hopefully on Friday - Speech therapy and dietitian on board   Unintentional weight loss  Failure to thrive- in setting of malnutrition, dysphagia - treat as above - PT/OT, RD   Orthostatic hypotension-  Continue PT OT Monitor orthostatic blood pressure closely   Anemia of chronic disease- baseline hgb around 10.5 Continue to monitor CBC Follow-up on anemia panel am.    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   VTE ppx: enoxaparin  (  LOVENOX ) injection 40 mg Start: 06/22/23 1800        Subjective:  Patient seen and examined in the presence of the wife Has agreed for  PEG tube placement by IR Denies abdominal pain chest pain or cough  Physical Exam:  General: awake, alert, NAD. Malnourished, elderly male HEENT: atraumatic, clear conjunctiva, anicteric sclera, MMM, hearing grossly normal. Hoarse voice Respiratory: normal respiratory effort. Cardiovascular: quick capillary refill, normal S1/S2, RRR, no JVD, murmurs Gastrointestinal: soft, NT, ND Nervous: A&O x3. All limbs moving spontaneously Extremities: moves all equally, no edema, low muscle tone Skin: see clinical images for details of wounds on hip/sacrum, heels Psychiatry: normal mood, congruent affect  Data Reviewed:    Latest Ref Rng & Units 06/23/2023    4:47 AM 06/22/2023   10:18 AM 05/22/2023    5:07 AM  CBC  WBC 4.0 - 10.5 K/uL 11.9  8.9  10.8   Hemoglobin 13.0 - 17.0 g/dL 9.8  57.8  46.9   Hematocrit 39.0 - 52.0 % 29.2  32.8  31.6   Platelets 150 - 400 K/uL 359  392  283        Latest Ref Rng & Units 06/24/2023    2:56 AM 06/23/2023    4:47 AM 06/22/2023   10:18 AM  BMP  Glucose 70 - 99 mg/dL 629  528  413   BUN 8 - 23 mg/dL 18  18  21    Creatinine 0.61 - 1.24 mg/dL 2.44  0.10  2.72   Sodium 135 - 145 mmol/L 136  137  134   Potassium 3.5 - 5.1 mmol/L 3.7  4.0  4.3   Chloride 98 - 111 mmol/L 105  104  100   CO2 22 - 32 mmol/L 23  24  26    Calcium  8.9 - 10.3 mg/dL 8.9  8.5  9.0      Vitals:   06/24/23 0347 06/24/23 0757 06/24/23 1514 06/24/23 1551  BP: (!) 90/41 (!) 91/42 (!) 99/41 (!) 99/51  Pulse: 83 93 92 97  Resp: 18 18    Temp: 99.7 F (37.6 C) 98 F (36.7 C)  98.3 F (36.8 C)  TempSrc: Oral Oral  Oral  SpO2: 95% 95%  94%  Weight:      Height:         Time spent: 42 minutes  Author: Ezzard Holms, MD 06/24/2023 4:36 PM  For on call review [email or web address].

## 2023-06-24 NOTE — Care Management Important Message (Signed)
 Important Message  Patient Details  Name: Billy Franco MRN: [id number] Date of Birth: [birth year]   Important Message Given:  Yes - Medicare IM     Anise Kerns 06/24/2023, 12:23 PM

## 2023-06-24 NOTE — Plan of Care (Signed)
  Problem: Coping: Goal: Ability to adjust to condition or change in health will improve Outcome: Progressing   Problem: Skin Integrity: Goal: Risk for impaired skin integrity will decrease Outcome: Progressing   Problem: Coping: Goal: Level of anxiety will decrease Outcome: Progressing   Problem: Pain Managment: Goal: General experience of comfort will improve and/or be controlled Outcome: Progressing   Problem: Safety: Goal: Ability to remain free from injury will improve Outcome: Progressing
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
## 2023-06-24 NOTE — Progress Notes (Signed)
 INFECTIOUS DISEASE PROGRESS NOTE Date of Admission:  06/22/2023     ID: Billy Franco is a 83 y.o. male with  decub ulcer Principal Problem:   Pressure ulcer Active Problems:   Stage IV pressure ulcer of sacral region (HCC)   Pressure injury of skin with infection   Protein-calorie malnutrition, severe   Subjective: No fevers General surgery seen and no surg planned Considering PEG Tube   ROS  Eleven systems are reviewed and negative except per hpi  Medications:  Antibiotics Given (last 72 hours)     Date/Time Action Medication Dose Rate   06/22/23 1214 New Bag/Given   ceFEPIme (MAXIPIME) 2 g in sodium chloride  0.9 % 100 mL IVPB 2 g 200 mL/hr   06/22/23 1318 New Bag/Given   vancomycin (VANCOREADY) IVPB 1500 mg/300 mL 1,500 mg 150 mL/hr   06/22/23 2112 New Bag/Given   ceFEPIme (MAXIPIME) 2 g in sodium chloride  0.9 % 100 mL IVPB 2 g 200 mL/hr   06/23/23 0910 New Bag/Given   ceFEPIme (MAXIPIME) 2 g in sodium chloride  0.9 % 100 mL IVPB 2 g 200 mL/hr   06/23/23 1257 New Bag/Given   vancomycin (VANCOREADY) IVPB 1500 mg/300 mL 1,500 mg 150 mL/hr   06/23/23 2128 New Bag/Given   ceFEPIme (MAXIPIME) 2 g in sodium chloride  0.9 % 100 mL IVPB 2 g 200 mL/hr   06/24/23 0428 New Bag/Given   ceFEPIme (MAXIPIME) 2 g in sodium chloride  0.9 % 100 mL IVPB 2 g 200 mL/hr   06/24/23 1204 New Bag/Given   vancomycin (VANCOREADY) IVPB 1500 mg/300 mL 1,500 mg 150 mL/hr       Chlorhexidine  Gluconate Cloth  6 each Topical Q0600   enoxaparin  (LOVENOX ) injection  40 mg Subcutaneous Q24H   insulin  aspart  0-15 Units Subcutaneous TID WC   leptospermum manuka honey  1 Application Topical Daily    Objective: Vital signs in last 24 hours: Temp:  [98 F (36.7 C)-99.7 F (37.6 C)] 98 F (36.7 C) (06/04 0757) Pulse Rate:  [76-98] 93 (06/04 0757) Resp:  [16-20] 18 (06/04 0757) BP: (90-101)/(41-47) 91/42 (06/04 0757) SpO2:  [95 %-100 %] 95 % (06/04 0757) Weight:  [60.4 kg] 60.4 kg (06/03  1507) General: Alert, cooperative, pale frail.  Head: Normocephalic, without obvious abnormality, atraumatic. Eyes: Conjunctivae clear, anicteric sclerae. Pupils are equal ENT Nares normal. No drainage or sinus tenderness. Lips, mucosa, and tongue normal. No Thrush Neck: anterior surgical scar healed well no carotid bruit and no JVD. Back: No CVA tenderness. Lungs: b/l air entry Heart: s1s2 Abdomen: Soft, non-tender,not distended. Bowel sounds normal. No masses Sacral wound has 60% red base and rest slough/fibrin    Extremities: atraumatic, no cyanosis. No edema. No clubbing  heels      Lab Results Recent Labs    06/22/23 1018 06/23/23 0447 06/24/23 0256  WBC 8.9 11.9*  --   HGB 10.6* 9.8*  --   HCT 32.8* 29.2*  --   NA 134* 137 136  K 4.3 4.0 3.7  CL 100 104 105  CO2 26 24 23   BUN 21 18 18   CREATININE 0.91 0.83 0.78    Microbiology: Results for orders placed or performed during the hospital encounter of 06/22/23  Aerobic Culture w Gram Stain (superficial specimen)     Status: None (Preliminary result)   Collection Time: 06/23/23  5:22 PM   Specimen: Wound  Result Value Ref Range Status   Specimen Description   Final    WOUND Performed at  [facility], [address], [place], [place] [address]    Special Requests   Final    NONE Performed at [facility], [address] [place], [place] [address]    Gram Stain   Final    RARE WBC PRESENT, PREDOMINANTLY PMN RARE GRAM POSITIVE RODS    Culture   Final    NO GROWTH < 12 HOURS Performed at [facility], [address], [place], [place] [address]    Report Status PENDING  Incomplete    Studies/Results: DG Swallowing Func-Speech Pathology Result Date: 06/23/2023 Table formatting from the original result was not included. Modified Barium Swallow Study Patient Details Name: Billy Franco MRN: [id number] Date of Birth: 09-04-1940 Today's Date: 06/23/2023 HPI/PMH: HPI: Billy Franco is  a 83 y.o. 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 Pt and his wife report that spent most of the last 5-6 weeks bedbound, was not able to participate in PT d/t low blood pressure but did participate in PT/OT. He also reported swallowing problems, his diet has been downgraded to soft however family has witnessed several episodes of choking and coughing after eating.  His appetite decreased significantly and estimated at least several pounds of weight loss compared to last month. Clinical Impression: Pt is known to ST services following his ACDF C3-5 (4/21) and intubation (4/23-4/15) and was discharged on 05/21/2023 consuming regular diet with thin liquids.      Upon readmission to [facility] on 06/22/2023 he is on a puree diet with liquid medicines.This writter called his wife to get interim history. She reports that "he has something in his throat and tomorrow he was going to see a throat doctor." With more questions she reports that pt had an appt with Dr [facility]) tomorrow "because something is wrong with his throat, we are not sure if it is where he had his incision (for ACDF) or where he had a blockage in 2015." She reports that he did work with a Human resources officer at [employer] who had downgraded his diet d/t "something wrong with his throat."      Given the above report and recent surgical history, this writer felt instrumental swallow study would provide the most information.      This Clinical research associate met briefly with pt at bedside prior to transportation arriving. Education provided to pt on current plan. Pt was not able to tolerate sitting upright in bed d/t pain but was hopeful  he could sit in fluro chair for short period of time.  Pt arrived to [employer] suite with [place] hard collar in place and was able to tolerate sitting upright in fluro chair without complaint of increased pain.  Pt presents with profound sensorimotor pharyngeal phase dysphagia placing him at high risk for developing aspiraiton pneumonia, dehydration and malnutrition.      Pt's sensory response to boluses is mildly delayed with overall initiation occuring as boluses fall from the vallecula into the pyriform sinuses. As a result, pt with intermittent laryngeal penetration and aspiration prior to the swallow as airway remained open.  Pt's pharyngeal motor response is also impaired. Pt with reduced epiglottic deflection resulting in the epiglottis abutting posterior pharyngeal wall during majority of boluses allowing  for laryngeal penetration and silent aspiration during the swallow. In addition, pt with significantly reduced pharyngeal stripping and bolus passage thru the UES resulting in moderate amounts of pharyngeal residue especially with thicker consistencies remaining after the swallow. Pt was not sensate to residue. Pt largely unable to clear residue despite cues for multiple swallows.  At this time, most conservative diet would be NPO with ice chips to facilitate use of swallow musculature.     Information provided to pt's treatment team, including neurosurgery, to aid in developing POC in accordance with pt's desires. ST to follow acutely. Factors that may increase risk of adverse event in presence of aspiration Roderick Civatte & Jessy Morocco 2021): Factors that may increase risk of adverse event in presence of aspiration Roderick Civatte & Jessy Morocco 2021): Reduced cognitive function; Frail or deconditioned; Dependence for feeding and/or oral hygiene; Inadequate oral hygiene; Weak cough; Poor general health and/or compromised immunity; Limited mobility Recommendations/Plan: Swallowing Evaluation Recommendations Swallowing Evaluation  Recommendations Recommendations: NPO; Ice chips PRN after oral care Medication Administration: Via alternative means Supervision: Full supervision/cueing for swallowing strategies; Full assist for feeding (when consuming ice chips) Swallowing strategies  : Minimize environmental distractions; Slow rate; Small bites/sips Postural changes: Position pt fully upright for meals Oral care recommendations: Oral care before ice chips/water ; Oral care QID (4x/day) Recommended consults: -- (Neurosurgery, possible Palliative Care) Caregiver Recommendations: Avoid jello, ice cream, thin soups, popsicles; Remove water  pitcher; Have oral suction available Treatment Plan Treatment Plan Treatment recommendations: Therapy as outlined in treatment plan below Follow-up recommendations: Skilled nursing-short term rehab (<3 hours/day) Functional status assessment: Patient has had a recent decline in their functional status and/or demonstrates limited ability to make significant improvements in function in a reasonable and predictable amount of time. Treatment frequency: Min 2x/week Treatment duration: 2 weeks Interventions: Patient/family education; Aspiration precaution training Recommendations Recommendations for follow up therapy are one component of a multi-disciplinary discharge planning process, led by the attending physician.  Recommendations may be updated based on patient status, additional functional criteria and insurance authorization. Assessment: Orofacial Exam: Orofacial Exam Oral Cavity: Oral Hygiene: WFL Oral Cavity - Dentition: Poor condition; Missing dentition Orofacial Anatomy: WFL Oral Motor/Sensory Function: WFL Anatomy: Anatomy: Presence of cervical hardware (ACDF hardware present) Boluses Administered: Boluses Administered Boluses Administered: Thin liquids (Level 0); Mildly thick liquids (Level 2, nectar thick); Puree  Oral Impairment Domain: Oral Impairment Domain Lip Closure: No labial escape Tongue control  during bolus hold: Not tested Bolus preparation/mastication: Slow prolonged chewing/mashing with complete recollection Bolus transport/lingual motion: Slow tongue motion Oral residue: Trace residue lining oral structures Location of oral residue : Tongue Initiation of pharyngeal swallow : Valleculae; Pyriform sinuses  Pharyngeal Impairment Domain: Pharyngeal Impairment Domain Soft palate elevation: No bolus between soft palate (SP)/pharyngeal wall (PW) Laryngeal elevation: Minimal superior movement of thyroid  cartilage with minimal approximation of arytenoids to epiglottic petiole; Partial superior movement of thyroid  cartilage/partial approximation of arytenoids to epiglottic petiole Anterior hyoid excursion: Partial anterior movement Epiglottic movement: Partial inversion; No inversion Laryngeal vestibule closure: Incomplete, narrow column air/contrast in laryngeal vestibule Pharyngeal stripping wave : Present - diminished Pharyngoesophageal segment opening: Minimal distention/minimal duration, marked obstruction of flow Tongue base retraction: Trace column of contrast or air between tongue base and PPW Pharyngeal residue: Majority of contrast within or on pharyngeal structures Location of pharyngeal residue: Valleculae; Pharyngeal wall; Pyriform sinuses  Esophageal Impairment Domain: Esophageal Impairment Domain Esophageal clearance upright position: Esophageal retention Pill: No data recorded Penetration/Aspiration Scale Score: Penetration/Aspiration Scale Score 4.  Material enters airway,  CONTACTS cords then ejected out: Thin liquids (Level 0); Puree; Mildly thick liquids (Level 2, nectar thick) 5.  Material enters airway, CONTACTS cords and not ejected out: Thin liquids (Level 0); Mildly thick liquids (Level 2, nectar thick); Puree 6.  Material enters airway, passes BELOW cords then ejected out: Thin liquids (Level 0); Mildly thick liquids (Level 2, nectar thick); Puree 8.  Material enters airway, passes BELOW  cords without attempt by patient to eject out (silent aspiration) : Thin liquids (Level 0); Mildly thick liquids (Level 2, nectar thick); Puree Compensatory Strategies: No data recorded  General Information: Caregiver present: No  Diet Prior to this Study: Dysphagia 1 (pureed); Thin liquids (Level 0)   Temperature : Normal   Respiratory Status: WFL   Supplemental O2: None (Room air)   History of Recent Intubation: No  Behavior/Cognition: Alert; Cooperative; Pleasant mood Self-Feeding Abilities: Dependent for feeding Baseline vocal quality/speech: Normal Volitional Cough: Able to elicit Volitional Swallow: Able to elicit Exam Limitations: No limitations Goal Planning: Prognosis for improved oropharyngeal function: Guarded Barriers to Reach Goals: Time post onset; Severity of deficits; Overall medical prognosis (wound) No data recorded Patient/Family Stated Goal: "are you going to figue out what is going on in my throat" Consulted and agree with results and recommendations: Patient; Physician; Nurse; Dietitian (neurosurgery) Pain: Pain Assessment Pain Assessment: 0-10 Pain Score: 8 Pain Location: sacral wound Pain Descriptors / Indicators: Aching Pain Intervention(s): Limited activity within patient's tolerance; Monitored during session; Premedicated before session; Repositioned End of Session: Start Time:SLP Start Time (ACUTE ONLY): 1138 Stop Time: SLP Stop Time (ACUTE ONLY): 1152 Time Calculation:SLP Time Calculation (min) (ACUTE ONLY): 14 min Charges: SLP Evaluations $ SLP Speech Visit: 1 Visit SLP Evaluations $MBS Swallow: 1 Procedure SLP visit diagnosis: SLP Visit Diagnosis: Dysphagia, pharyngeal phase (R13.13) Past Medical History: Past Medical History: Diagnosis Date  Arthritis   Lower back, hips  Atrial fibrillation (HCC)   CHF (congestive heart failure) (HCC)   CKD (chronic kidney disease), stage III (HCC)   Complication of anesthesia   has "crooked airway"  Diff breathing after extubated (after kidney stone  procedure 07/2013)  Diabetes mellitus without complication (HCC)   GERD (gastroesophageal reflux disease)   Hypertension   Hypothyroidism   Nephrolithiasis  Past Surgical History: Past Surgical History: Procedure Laterality Date  ANTERIOR CERVICAL DECOMP/DISCECTOMY FUSION N/A 05/11/2023  Procedure: ANTERIOR CERVICAL DECOMPRESSION/DISCECTOMY FUSION 2 LEVELS;  Surgeon: Jodeen Munch, MD;  Location: [facility] ORS;  Service: Neurosurgery;  Laterality: N/A;  CATARACT EXTRACTION W/PHACO Right 05/07/2021  Procedure: CATARACT EXTRACTION PHACO AND INTRAOCULAR LENS PLACEMENT (IOC) RIGHT DIABETIC 8.63 00:56.5;  Surgeon: Clair Crews, MD;  Location: [facility];  Service: Ophthalmology;  Laterality: Right;  Diabetic  CATARACT EXTRACTION W/PHACO Left 05/21/2021  Procedure: CATARACT EXTRACTION PHACO AND INTRAOCULAR LENS PLACEMENT (IOC) LEFT DIABETIC 7.27 01:01.5;  Surgeon: Clair Crews, MD;  Location: [facility];  Service: Ophthalmology;  Laterality: Left;  Diabetic  CORONARY STENT INTERVENTION N/A 03/08/2020  Procedure: CORONARY STENT INTERVENTION;  Surgeon: Antonette Batters, MD;  Location: [facility] IN[facility];  Service: Cardiovascular;  Laterality: N/A;  LEFT HEART CATH AND CORONARY ANGIOGRAPHY Left 03/08/2020  Procedure: LEFT HEART CATH AND CORONARY ANGIOGRAPHY;  Surgeon: Antonette Batters, MD;  Location: [facility] INV[facility] CV [facility];  Service: Cardiovascular;  Laterality: Left;  NASAL FRACTURE SURGERY  1994  URETEROLITHOTOMY Right 2015 Happi Overton 06/23/2023, 2:11 PM  CT ABDOMEN PELVIS W CONTRAST Result Date: 06/22/2023 CLINICAL DATA:  Sacral ulcer with purulent discharge EXAM: CT ABDOMEN  AND PELVIS WITH CONTRAST TECHNIQUE: Multidetector CT imaging of the abdomen and pelvis was performed using the standard protocol following bolus administration of intravenous contrast. RADIATION DOSE REDUCTION: This exam was performed according to the departmental dose-optimization program which includes automated exposure  control, adjustment of the mA and/or kV according to patient size and/or use of iterative reconstruction technique. CONTRAST:  OMNIPAQUE  IOHEXOL  300 MG/ML  SOLN COMPARISON:  October 08, 2022 FINDINGS: Lower chest: Minimal basilar right lower lobe hypoventilatory chronic atelectatic changes without infiltrates or consolidations Hepatobiliary: No focal liver abnormality is seen. No gallstones, gallbladder wall thickening, or biliary dilatation. Pancreas: Unremarkable. No pancreatic ductal dilatation or surrounding inflammatory changes. Spleen: Normal in size without focal abnormality. Adrenals/Urinary Tract: No adrenal gland masses. Right kidney is unremarkable The left kidney demonstrates a 23 x 6 mm stone in the left renal pelvis and a 7 x 7 mm stone in the left kidney lower pole calices without hydronephrosis these findings are unchanged since prior. Stomach/Bowel: Stomach is within normal limits. Appendix appears normal. No evidence of bowel wall thickening, distention, or inflammatory changes. Abundant residual fecal material colon. Diffuse diverticulosis colon without diverticulitis. Vascular/Lymphatic: No significant vascular findings are present. No enlarged abdominal or pelvic lymph nodes. Reproductive: Prostate is unremarkable. Other: No abdominal wall hernia or abnormality. No abdominopelvic ascites. Musculoskeletal: Sacral ulcer with exposure of the tip of the Cox's correlate with chronic decubitus ulcer and associated osteomyelitis. Findings unchanged since prior examination no evidence of drainable abscesses or fluid collections. IMPRESSION: *Sacral ulcer with exposure of the tip of the Cox's correlate with chronic decubitus ulcer and associated osteomyelitis. Findings unchanged since prior examination no evidence of drainable abscesses or fluid collections. *Left nephrolithiasis without hydronephrosis. *Abundant residual fecal material colon. *Diffuse diverticulosis colon without diverticulitis.  *Aortic atherosclerosis. Electronically Signed   By: Fredrich Jefferson M.D.   On: 06/22/2023 13:28    Assessment/Plan: Multiple pressure wounds Sacrum stage IV- pt has been on levaquin and bactrim for 2 weeks- currently on vanco and cefepime- Pending culture culture for what its worth to look for MRSA/Pseudomonas.  ESR > 140 Will need IV abx for several weeks if we plan to heal this wound as failing otpt therapy Will place picc- discussed with pt and wife and they wish to be aggressive at this potin.  IV vanco and ceftriaxone .   The most important aspect of wound healing is off loading, air mattress, topical care, improve nutrition and prevent soiling with stool. Seen by surgical team - no surgical intervention   Cervical myelopathy and central cord syndrome with Acute neurological deficit in April following a fall necessitating ACDF C3-C5 Site healed well   Thank you very much for the consult. Will follow with you.  Eartha Gold   06/24/2023, 12:28 PM

## 2023-06-24 NOTE — Plan of Care (Signed)

## 2023-06-24 NOTE — Progress Notes (Signed)
 Occupational Therapy Treatment Patient Details Name: Billy Franco MRN: [id number] DOB: Jun 21, 1940 Today's Date: 06/24/2023   History of present illness Pt is an 83 y.o. male presenting to hospital with N/V and dehydration; pt also with non-healing sacral wound.  Recent hospitalization s/p fall and s/p C3-5 ACDF 05/11/23 (pt with central cord syndrome).  Pt admitted with infected stage IV sacral ulcer, chronic osteomyelitis, worsening dysphagia, intractable N/V, unintentional weight loss, FTT, and chronic urinary retention.  PMH includes DM, CKD, htn, HLD, CHF, CAD, a-fib, chronic back pain, C3-5 ACDF.   OT comments  Pt seen for skilled co-treatment with PT and OT to address functional deficits safely. OT wrapping B LEs with ACE wraps in figure eight pattern for BP management. Pt tolerates well. BP in supine is 104/46(65). AAROM for B UEs and LEs in all planes of movement x 10 reps. Sit >supine with max-total A of 2. Once seated Hard collar donned. Pt needing max A to maintain static sitting balance. BP in seated position is 138/86(103). Pt stands within STEDY with max - total A of 2 for manual facilitation of upright positioning. Third helper utilized for hygiene as pt has BM while standing. Pt stands x 2 reps this session and then returns to supine at end of session with +2 assistance. Pt making excellent progress and is motivated. Pt is far from baseline at this time.       If plan is discharge home, recommend the following:  Two people to help with walking and/or transfers;A lot of help with bathing/dressing/bathroom;Direct supervision/assist for medications management;Assist for transportation;Assistance with cooking/housework;Help with stairs or ramp for entrance;Assistance with feeding   Equipment Recommendations  Other (comment) (defer to next venue of care)    Recommendations for Other Services Rehab consult    Precautions / Restrictions Precautions Precautions: Fall;Cervical Precaution  Booklet Issued: No Recall of Precautions/Restrictions: Impaired Precaution/Restrictions Comments: Hard collar when OOB (apply in sitting) Required Braces or Orthoses: Cervical Brace Cervical Brace: Hard collar;Other (comment) Restrictions Weight Bearing Restrictions Per Provider Order: No       Mobility Bed Mobility Overal bed mobility: Needs Assistance Bed Mobility: Sidelying to Sit, Sit to Sidelying   Sidelying to sit: Max assist, +2 for physical assistance, HOB elevated     Sit to sidelying: Max assist, +2 for physical assistance, HOB elevated      Transfers Overall transfer level: Needs assistance Equipment used: 2 person hand held assist, Ambulation equipment used Transfers: Sit to/from Stand             General transfer comment: Max-total A of 2 to stand within stedy and get upright with manual facilitation Transfer via Lift Equipment: Stedy   Balance Overall balance assessment: Needs assistance Sitting-balance support: Single extremity supported, Feet supported Sitting balance-Leahy Scale: Poor   Postural control: Posterior lean, Left lateral lean Standing balance support: Reliant on assistive device for balance, Bilateral upper extremity supported Standing balance-Leahy Scale: Poor                             ADL either performed or assessed with clinical judgement   ADL Overall ADL's : Needs assistance/impaired                                       General ADL Comments: BM with mobility in bed and needing +2 assistance to stand at  EOB within STEDY and thrid helper to assist with hygiene while standing.    Extremity/Trunk Assessment                       Communication Communication Communication: No apparent difficulties   Cognition Arousal: Alert Behavior During Therapy: WFL for tasks assessed/performed Cognition: Cognition impaired     Awareness: Online awareness impaired, Intellectual awareness intact        OT - Cognition Comments: Decreased awareness of deficits/safety                 Following commands: Impaired Following commands impaired: Follows one step commands with increased time      Cueing   Cueing Techniques: Verbal cues             Pertinent Vitals/ Pain       Pain Assessment Pain Assessment: Faces Faces Pain Scale: Hurts little more Pain Location: sacral wound Pain Descriptors / Indicators: Discomfort Pain Intervention(s): Limited activity within patient's tolerance, Monitored during session, Repositioned         Frequency  Min 2X/week        Progress Toward Goals  OT Goals(current goals can now be found in the care plan section)  Progress towards OT goals: Progressing toward goals     Plan      Co-evaluation    PT/OT/SLP Co-Evaluation/Treatment: Yes Reason for Co-Treatment: Complexity of the patient's impairments (multi-system involvement);For patient/therapist safety;To address functional/ADL transfers PT goals addressed during session: Mobility/safety with mobility;Balance OT goals addressed during session: ADL's and self-care      AM-PAC OT "6 Clicks" Daily Activity     Outcome Measure   Help from another person eating meals?: Total Help from another person taking care of personal grooming?: Total Help from another person toileting, which includes using toliet, bedpan, or urinal?: Total Help from another person bathing (including washing, rinsing, drying)?: Total Help from another person to put on and taking off regular upper body clothing?: Total Help from another person to put on and taking off regular lower body clothing?: Total 6 Click Score: 6    End of Session Equipment Utilized During Treatment: Other (comment) (stedy)  OT Visit Diagnosis: Other abnormalities of gait and mobility (R26.89);Hemiplegia and hemiparesis;Feeding difficulties (R63.3) Hemiplegia - Right/Left: Right Hemiplegia - dominant/non-dominant:  Dominant Hemiplegia - caused by: Other cerebrovascular disease   Activity Tolerance Patient tolerated treatment well   Patient Left in bed;with call bell/phone within reach;with nursing/sitter in room;Other (comment)   Nurse Communication Mobility status;Other (comment) (B LE wraps removed and prevalon boots donned)        Time: 6962-9528 OT Time Calculation (min): 46 min  Charges: OT General Charges $OT Visit: 1 Visit OT Treatments $Self Care/Home Management : 8-22 mins $Therapeutic Activity: 8-22 mins George Kinder, MS, OTR/L , CBIS ascom [phone]  06/24/23, 4:08 PM

## 2023-06-24 NOTE — Progress Notes (Signed)
 Physical Therapy Treatment Patient Details Name: Billy Franco MRN: [id number] DOB: January 22, 1940 Today's Date: 06/24/2023   History of Present Illness Pt is an 83 y.o. male presenting to hospital with N/V and dehydration; pt also with non-healing sacral wound.  Recent hospitalization s/p fall and s/p C3-5 ACDF 05/11/23 (pt with central cord syndrome).  Pt admitted with infected stage IV sacral ulcer, chronic osteomyelitis, worsening dysphagia, intractable N/V, unintentional weight loss, FTT, and chronic urinary retention.  PMH includes DM, CKD, htn, HLD, CHF, CAD, a-fib, chronic back pain, C3-5 ACDF.    PT Comments  Pt in bed with OT at bedside. Pt reported motivation and eagerness to attempt all mobility, despite fatigue and pain. Able to attempt and participate as much as possible. OT wrapped BLE with ace wraps (to thigh high) to assist with low BP; MD contacted about ted hoses. Pt required maxAx2-totalAx2, use of sara stedy. MAxA to don cervical in sitting as well.  Sit <> stand twice, with stedy. Returned to supine with all needs in reach. Recommendation updated to reflect pt motivation, participation and to maximize outcomes (>3hrs of therapy).   BP initially low but with bed level exercises (BUE and BLE) improved to [facility] and remained appropriate in sitting.       If plan is discharge home, recommend the following: Two people to help with walking and/or transfers;Two people to help with bathing/dressing/bathroom;Assistance with cooking/housework;Assistance with feeding;Assist for transportation;Help with stairs or ramp for entrance;Supervision due to cognitive status   Can travel by private vehicle     No  Equipment Recommendations  Other (comment) (TBD)    Recommendations for Other Services Rehab consult     Precautions / Restrictions Precautions Precautions: Fall;Cervical Precaution Booklet Issued: No Recall of Precautions/Restrictions: Impaired Precaution/Restrictions Comments: Hard  collar when OOB (apply in sitting) Required Braces or Orthoses: Cervical Brace Cervical Brace: Hard collar;Other (comment) Restrictions Weight Bearing Restrictions Per Provider Order: No     Mobility  Bed Mobility Overal bed mobility: Needs Assistance Bed Mobility: Sidelying to Sit, Sit to Sidelying   Sidelying to sit: Max assist, +2 for physical assistance, HOB elevated     Sit to sidelying: Max assist, +2 for physical assistance, HOB elevated      Transfers Overall transfer level: Needs assistance Equipment used: 2 person hand held assist, Ambulation equipment used Transfers: Sit to/from Stand             General transfer comment: Max-total A of 2 to stand within stedy and get upright with manual facilitation Transfer via Lift Equipment: Stedy  Ambulation/Gait                   Stairs             Wheelchair Mobility     Tilt Bed    Modified Rankin (Stroke Patients Only)       Balance Overall balance assessment: Needs assistance Sitting-balance support: Single extremity supported, Feet supported Sitting balance-Leahy Scale: Poor     Standing balance support: Reliant on assistive device for balance, Bilateral upper extremity supported Standing balance-Leahy Scale: Poor Standing balance comment: maxAx2 to maintain                            Communication Communication Communication: No apparent difficulties Factors Affecting Communication: Reduced clarity of speech  Cognition Arousal: Alert Behavior During Therapy: [facility] for tasks assessed/performed  PT - Cognition Comments: Impaired awareness of deficits Following commands: Impaired Following commands impaired: Follows one step commands with increased time    Cueing Cueing Techniques: Verbal cues  Exercises      General Comments        Pertinent Vitals/Pain Pain Assessment Pain Assessment: Faces Faces Pain Scale: Hurts little  more Pain Location: sacral wound Pain Descriptors / Indicators: Discomfort Pain Intervention(s): Limited activity within patient's tolerance, Monitored during session, Repositioned    Home Living                          Prior Function            PT Goals (current goals can now be found in the care plan section) Progress towards PT goals: Progressing toward goals    Frequency    Min 2X/week      PT Plan      Co-evaluation PT/OT/SLP Co-Evaluation/Treatment: Yes Reason for Co-Treatment: Complexity of the patient's impairments (multi-system involvement);For patient/therapist safety;To address functional/ADL transfers PT goals addressed during session: Mobility/safety with mobility;Balance OT goals addressed during session: ADL's and self-care      AM-PAC PT "6 Clicks" Mobility   Outcome Measure  Help needed turning from your back to your side while in a flat bed without using bedrails?: Total Help needed moving from lying on your back to sitting on the side of a flat bed without using bedrails?: Total Help needed moving to and from a bed to a chair (including a wheelchair)?: Total Help needed standing up from a chair using your arms (e.g., wheelchair or bedside chair)?: Total Help needed to walk in hospital room?: Total Help needed climbing 3-5 steps with a railing? : Total 6 Click Score: 6    End of Session Equipment Utilized During Treatment: Cervical collar Activity Tolerance: Patient tolerated treatment well;No increased pain Patient left: in bed;with call bell/phone within reach;with bed alarm set;with family/visitor present;Other (comment) Nurse Communication: Mobility status;Precautions PT Visit Diagnosis: Other abnormalities of gait and mobility (R26.89);History of falling (Z91.81);Other symptoms and signs involving the nervous system (R29.898);Pain Pain - part of body:  (sacral wound pain)     Time: 1421-1450 PT Time Calculation (min) (ACUTE  ONLY): 29 min  Charges:    $Therapeutic Activity: 8-22 mins PT General Charges $$ ACUTE PT VISIT: 1 Visit                     Darien Eden PT, DPT 4:27 PM,06/24/23

## 2023-06-25 DIAGNOSIS — E43 Unspecified severe protein-calorie malnutrition: Secondary | ICD-10-CM | POA: Diagnosis not present

## 2023-06-25 LAB — CBC WITH DIFFERENTIAL/PLATELET
Abs Immature Granulocytes: 0.05 10*3/uL (ref 0.00–0.07)
Basophils Absolute: 0 10*3/uL (ref 0.0–0.1)
Basophils Relative: 0 %
Eosinophils Absolute: 0.3 10*3/uL (ref 0.0–0.5)
Eosinophils Relative: 4 %
HCT: 28.1 % — ABNORMAL LOW (ref 39.0–52.0)
Hemoglobin: 9.5 g/dL — ABNORMAL LOW (ref 13.0–17.0)
Immature Granulocytes: 1 %
Lymphocytes Relative: 11 %
Lymphs Abs: 0.9 10*3/uL (ref 0.7–4.0)
MCH: 31.5 pg (ref 26.0–34.0)
MCHC: 33.8 g/dL (ref 30.0–36.0)
MCV: 93 fL (ref 80.0–100.0)
Monocytes Absolute: 0.6 10*3/uL (ref 0.1–1.0)
Monocytes Relative: 7 %
Neutro Abs: 5.7 10*3/uL (ref 1.7–7.7)
Neutrophils Relative %: 77 %
Platelets: 359 10*3/uL (ref 150–400)
RBC: 3.02 MIL/uL — ABNORMAL LOW (ref 4.22–5.81)
RDW: 14 % (ref 11.5–15.5)
WBC: 7.5 10*3/uL (ref 4.0–10.5)
nRBC: 0 % (ref 0.0–0.2)

## 2023-06-25 LAB — GLUCOSE, CAPILLARY
Glucose-Capillary: 154 mg/dL — ABNORMAL HIGH (ref 70–99)
Glucose-Capillary: 69 mg/dL — ABNORMAL LOW (ref 70–99)
Glucose-Capillary: 62 mg/dL — ABNORMAL LOW (ref 70–99)
Glucose-Capillary: 132 mg/dL — ABNORMAL HIGH (ref 70–99)
Glucose-Capillary: 136 mg/dL — ABNORMAL HIGH (ref 70–99)
Glucose-Capillary: 95 mg/dL (ref 70–99)

## 2023-06-25 LAB — RENAL FUNCTION PANEL
Albumin: 2 g/dL — ABNORMAL LOW (ref 3.5–5.0)
Anion gap: 7 (ref 5–15)
BUN: 17 mg/dL (ref 8–23)
CO2: 20 mmol/L — ABNORMAL LOW (ref 22–32)
Calcium: 8.3 mg/dL — ABNORMAL LOW (ref 8.9–10.3)
Chloride: 111 mmol/L (ref 98–111)
Creatinine, Ser: 0.78 mg/dL (ref 0.61–1.24)
GFR, Estimated: >60 mL/min (ref >60)
Glucose, Bld: 150 mg/dL — ABNORMAL HIGH (ref 70–99)
Phosphorus: 2.5 mg/dL (ref 2.5–4.6)
Potassium: 2.9 mmol/L — ABNORMAL LOW (ref 3.5–5.1)
Sodium: 138 mmol/L (ref 135–145)

## 2023-06-25 LAB — BASIC METABOLIC PANEL WITH GFR
Anion gap: 6 (ref 5–15)
BUN: 17 mg/dL (ref 8–23)
CO2: 20 mmol/L — ABNORMAL LOW (ref 22–32)
Calcium: 8.3 mg/dL — ABNORMAL LOW (ref 8.9–10.3)
Chloride: 112 mmol/L — ABNORMAL HIGH (ref 98–111)
Creatinine, Ser: 0.79 mg/dL (ref 0.61–1.24)
GFR, Estimated: >60 mL/min (ref >60)
Glucose, Bld: 148 mg/dL — ABNORMAL HIGH (ref 70–99)
Potassium: 2.9 mmol/L — ABNORMAL LOW (ref 3.5–5.1)
Sodium: 138 mmol/L (ref 135–145)

## 2023-06-25 LAB — MAGNESIUM: Magnesium: 2.1 mg/dL (ref 1.7–2.4)

## 2023-06-25 MED ORDER — SODIUM CHLORIDE 0.9% FLUSH: 10.0000 mL | INTRAVENOUS | Status: DC | PRN

## 2023-06-25 MED ORDER — DEXTROSE 50 % IV SOLN
INTRAVENOUS | Status: AC
  Administered 2023-06-25: 25 mL
  Filled 2023-06-25: qty 50

## 2023-06-25 MED ORDER — SODIUM CHLORIDE 0.9% FLUSH
10.0000 mL | Freq: Two times a day (BID) | INTRAVENOUS | Status: DC
  Administered 2023-06-25 – 2023-06-26 (×4): 10 mL
  Administered 2023-06-27: 20 mL
  Administered 2023-06-27 – 2023-07-07 (×18): 10 mL

## 2023-06-25 MED ORDER — POTASSIUM CHLORIDE 10 MEQ/100ML IV SOLN
10.0000 meq | INTRAVENOUS | Status: AC
  Administered 2023-06-25 (×6): 10 meq via INTRAVENOUS
  Filled 2023-06-25 (×5): qty 100

## 2023-06-25 NOTE — Progress Notes (Signed)
 Peripherally Inserted Central Catheter Placement  The IV Nurse has discussed with the patient and/or persons authorized to consent for the patient, the purpose of this procedure and the potential benefits and risks involved with this procedure.  The benefits include less needle sticks, lab draws from the catheter, and the patient may be discharged home with the catheter. Risks include, but not limited to, infection, bleeding, blood clot (thrombus formation), and puncture of an artery; nerve damage and irregular heartbeat and possibility to perform a PICC exchange if needed/ordered by physician.  Alternatives to this procedure were also discussed.  Bard Power PICC patient education guide, fact sheet on infection prevention and patient information card has been provided to patient /or left at bedside.   Consent obtained with wife  PICC Placement Documentation  PICC Single Lumen 06/25/23 Right Brachial 38 cm 0 cm (Active)  Indication for Insertion or Continuance of Line Home intravenous therapies (PICC only) 06/25/23 1100  Exposed Catheter (cm) 0 cm 06/25/23 1100  Site Assessment Clean, Dry, Intact 06/25/23 1100  Line Status Flushed;Saline locked;Blood return noted 06/25/23 1100  Dressing Type Transparent;Securing device 06/25/23 1100  Dressing Status Antimicrobial disc/dressing in place;Clean, Dry, Intact 06/25/23 1100  Line Care Connections checked and tightened 06/25/23 1100  Line Adjustment (NICU/IV Team Only) No 06/25/23 1100  Dressing Intervention New dressing;Adhesive placed at insertion site (IV team only) 06/25/23 1100  Dressing Change Due 07/02/23 06/25/23 1100       Roseanne Cones Lemitar 06/25/2023, 11:39 AM

## 2023-06-25 NOTE — Progress Notes (Addendum)
 OT Cancellation Note  Patient Details Name: Billy Franco MRN: [id number] DOB: 1940-01-28   Cancelled Treatment:    Reason Eval/Treat Not Completed: Patient at procedure or test/ unavailable. Upon attempt this morning, pt unavailable. Will re-attempt as able.   Addendum, 1:20pm: upon 2nd attempt, pt noted with low BG. Will re-attempt next date as appropriate.  Billy Franco R., MPH, MS, OTR/L ascom [phone] 06/25/23, 1:21 PM

## 2023-06-25 NOTE — Progress Notes (Signed)
 Progress Note   Patient: Billy Franco XBJ:[id number] DOB: 02/03/1940 DOA: 06/22/2023     3 DOS: the patient was seen and examined on 06/25/2023   Brief hospital course: Darcey Demma is a 83 y.o. 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 Upon SLP, barium swallow study- patient has severe pharyngeal musculature weakness and high risk of aspiration so made NPO and will be pursuing PEG- has ultimately been very malnourished and had significant weight loss which impacts wound healing and ability to work with PT to increase mobility.  Also reports orthostatic hypotension impacting PT participation and was inexplicably on multiple antihypertensives and midodrine simultaneously. Holding all meds he is on borderline lower limits of normal BP and was able to participate with PT.    ED Course: Afebrile, nontachycardic nonhypotensive nonhypoxic.  CT abdominal pelvis showed no acute findings seen stomach.  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  Blood work showed BUN 13 creatinine 0.9.   Initial treatment included vancomycin and cefepime.   Assessment and Plan:    Stage IV sacral ulcer  sacral osteomyelitis- refer to clinical images and WOC note description on 6/3 for further details.  Continue antibiotics - wound care as recommended by [facility] team - RD consulted as patient is significantly malnourished.  - Infectious disease on board PICC line requested   Severe malnutrition Dietitian on board   Neurogenic Dysphagia,  worsening- recently discharged on full diet and at SNF declined to tolerating pureed diet only. Still having aspirations. SLP evaluated with barium swallow 6/3 and has SEVERE pharyngeal phase dysphagia. Severely high risk of aspiration. Made him NPO immediately and started on IV fluids while awaiting evaluation for PEG placement. Discussed this plan with patient and wife who are agreeable to this plan. His dysphagia is not likely to be reversible in short term or ever since I suspect related to innervation dysfunction from recent cervical spine compression and neurosurgery procedure sequelae.  - I would continue to allow ice chips to continue to promote use of swallow musculature but otherwise NPO to avoid aspiration.  - neurosurgery consulted to evaluate and reviewed swallow study, Dr Felipe Horton with see patient to discuss if further evaluation/intervention is needed. Continue supplemental IV fluid IR considering placement of PEG tube after Eliquis  washout hopefully tomorrow - Speech therapy and dietitian on board   Unintentional weight loss  Failure to thrive- in setting of malnutrition, dysphagia - treat as above - PT/OT, RD   Orthostatic hypotension-  Continue PT OT Monitor orthostatic blood pressure closely   Anemia of chronic disease- baseline hgb around 10.5 Continue to monitor CBC Follow-up on anemia panel am.    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   VTE  ppx: enoxaparin  (LOVENOX ) injection 40 mg Start: 06/22/23 1800      Subjective:  Patient seen and examined in the presence of the wife Has agreed for PEG  tube placement by IR hopefully by tomorrow Patient denies abdominal pain chest pain or cough   Physical Exam:  General: awake, alert, NAD. Malnourished, elderly male HEENT: atraumatic, clear conjunctiva, anicteric sclera, MMM, hearing grossly normal. Hoarse voice Respiratory: normal respiratory effort. Cardiovascular: quick capillary refill, normal S1/S2, RRR, no JVD, murmurs Gastrointestinal: soft, NT, ND Nervous: A&O x3. All limbs moving spontaneously Extremities: moves all equally, no edema, low muscle tone Skin: see clinical images for details of wounds on hip/sacrum, heels Psychiatry: normal mood, congruent affect   Data Reviewed:  Vitals:   06/25/23 0554 06/25/23 0742 06/25/23 0800 06/25/23 1502  BP: (!) 104/56 (!) 104/53 (!) 112/48 (!) 107/54  Pulse: 88 83 67 70  Resp: 20 16 18 18   Temp: 99 F (37.2 C) 98.6 F (37 C)  98.8 F (37.1 C)  TempSrc: Oral     SpO2: 98% 100%  100%  Weight:      Height:          Latest Ref Rng & Units 06/25/2023    5:25 AM 06/23/2023    4:47 AM 06/22/2023   10:18 AM  CBC  WBC 4.0 - 10.5 K/uL 7.5  11.9  8.9   Hemoglobin 13.0 - 17.0 g/dL 9.5  9.8  96.2   Hematocrit 39.0 - 52.0 % 28.1  29.2  32.8   Platelets 150 - 400 K/uL 359  359  392        Latest Ref Rng & Units 06/25/2023    5:25 AM 06/24/2023    2:56 AM 06/23/2023    4:47 AM  BMP  Glucose 70 - 99 mg/dL 70 - 99 mg/dL 952    841  324  401   BUN 8 - 23 mg/dL 8 - 23 mg/dL 17    17  18  18    Creatinine 0.61 - 1.24 mg/dL 0.27 - 2.53 mg/dL 6.64    4.03  4.74  2.59   Sodium 135 - 145 mmol/L 135 - 145 mmol/L 138    138  136  137   Potassium 3.5 - 5.1 mmol/L 3.5 - 5.1 mmol/L 2.9    2.9  3.7  4.0   Chloride 98 - 111 mmol/L 98 - 111 mmol/L 111    112  105  104   CO2 22 - 32 mmol/L 22 - 32 mmol/L 20    20  23  24    Calcium  8.9 - 10.3 mg/dL 8.9 - 56.3 mg/dL 8.3    8.3  8.9  8.5      Author: Ezzard Holms, MD 06/25/2023 3:25 PM  For on call review [email or web address].

## 2023-06-25 NOTE — TOC Progression Note (Signed)
 Transition of Care [facility]) - Progression Note    Patient Details  Name: Billy Franco MRN: [id number] Date of Birth: [birth year]  [facility] CM/SW Contact  Loman Risk, RN Phone Number: 06/25/2023, 3:55 PM  Clinical Narrative:     Met with patient and wife at bedside.   CIR admissions coordinator has determined patient is not a candidate at this time  Patient not eligible for LTACH due to requiring a 3 night ICU stay  Patient states that he would like to see if there are any other SNF beds available. He and his wife are aware that since he has used his 20 days, we may not receive any other bed offers.  Patient in agreement to return to [facility] if there is not an option.   Gena at [facility] confirms patient can return pending auth  Patient to have peg tube placed tomorrow.   IV antibiotics and duration still pending       Expected Discharge Plan and Services                                               Social Determinants of Health (SDOH) Interventions SDOH Screenings   Food Insecurity: No Food Insecurity (06/22/2023)  Housing: Low Risk  (06/22/2023)  Transportation Needs: No Transportation Needs (06/22/2023)  Utilities: Not At Risk (06/22/2023)  Alcohol Screen: Low Risk  (01/24/2019)  Depression (PHQ2-9): Low Risk  (02/02/2023)  Financial Resource Strain: Low Risk  (01/24/2019)  Physical Activity: Sufficiently Active (01/24/2019)  Social Connections: Socially Integrated (06/22/2023)  Stress: No Stress Concern Present (01/24/2019)  Tobacco Use: Low Risk  (06/22/2023)    Readmission Risk Interventions     No data to display

## 2023-06-25 NOTE — Plan of Care (Signed)

## 2023-06-26 ENCOUNTER — Inpatient Hospital Stay: Admitting: Radiology

## 2023-06-26 DIAGNOSIS — A419 Sepsis, unspecified organism: Secondary | ICD-10-CM

## 2023-06-26 DIAGNOSIS — E43 Unspecified severe protein-calorie malnutrition: Secondary | ICD-10-CM | POA: Diagnosis not present

## 2023-06-26 HISTORY — PX: IR GASTROSTOMY TUBE MOD SED: IMG625

## 2023-06-26 LAB — CBC WITH DIFFERENTIAL/PLATELET
Abs Immature Granulocytes: 0.04 10*3/uL (ref 0.00–0.07)
Basophils Absolute: 0 10*3/uL (ref 0.0–0.1)
Basophils Relative: 0 %
Eosinophils Absolute: 0.5 10*3/uL (ref 0.0–0.5)
Eosinophils Relative: 6 %
HCT: 28.3 % — ABNORMAL LOW (ref 39.0–52.0)
Hemoglobin: 9.2 g/dL — ABNORMAL LOW (ref 13.0–17.0)
Immature Granulocytes: 1 %
Lymphocytes Relative: 11 %
Lymphs Abs: 0.8 10*3/uL (ref 0.7–4.0)
MCH: 30.6 pg (ref 26.0–34.0)
MCHC: 32.5 g/dL (ref 30.0–36.0)
MCV: 94 fL (ref 80.0–100.0)
Monocytes Absolute: 0.5 10*3/uL (ref 0.1–1.0)
Monocytes Relative: 6 %
Neutro Abs: 6 10*3/uL (ref 1.7–7.7)
Neutrophils Relative %: 76 %
Platelets: 334 10*3/uL (ref 150–400)
RBC: 3.01 MIL/uL — ABNORMAL LOW (ref 4.22–5.81)
RDW: 14 % (ref 11.5–15.5)
WBC: 7.8 10*3/uL (ref 4.0–10.5)
nRBC: 0 % (ref 0.0–0.2)

## 2023-06-26 LAB — GLUCOSE, CAPILLARY
Glucose-Capillary: 90 mg/dL (ref 70–99)
Glucose-Capillary: 84 mg/dL (ref 70–99)
Glucose-Capillary: 63 mg/dL — ABNORMAL LOW (ref 70–99)
Glucose-Capillary: 155 mg/dL — ABNORMAL HIGH (ref 70–99)
Glucose-Capillary: 181 mg/dL — ABNORMAL HIGH (ref 70–99)

## 2023-06-26 LAB — BASIC METABOLIC PANEL WITH GFR
Anion gap: 4 — ABNORMAL LOW (ref 5–15)
BUN: 15 mg/dL (ref 8–23)
CO2: 22 mmol/L (ref 22–32)
Calcium: 8.5 mg/dL — ABNORMAL LOW (ref 8.9–10.3)
Chloride: 114 mmol/L — ABNORMAL HIGH (ref 98–111)
Creatinine, Ser: 0.65 mg/dL (ref 0.61–1.24)
GFR, Estimated: >60 mL/min (ref >60)
Glucose, Bld: 71 mg/dL (ref 70–99)
Potassium: 3.7 mmol/L (ref 3.5–5.1)
Sodium: 140 mmol/L (ref 135–145)

## 2023-06-26 LAB — PROTIME-INR
INR: 1.3 — ABNORMAL HIGH (ref 0.8–1.2)
Prothrombin Time: 16.3 s — ABNORMAL HIGH (ref 11.4–15.2)

## 2023-06-26 LAB — VANCOMYCIN, TROUGH: Vancomycin Tr: 24 ug/mL (ref 15–20)

## 2023-06-26 MED ORDER — VANCOMYCIN HCL IN DEXTROSE 1-5 GM/200ML-% IV SOLN
1000.0000 mg | INTRAVENOUS | Status: DC
  Administered 2023-06-27: 1000 mg via INTRAVENOUS
  Filled 2023-06-26 (×2): qty 200

## 2023-06-26 MED ORDER — LIDOCAINE HCL 1 % IJ SOLN
INTRAMUSCULAR | Status: AC
  Filled 2023-06-26: qty 20

## 2023-06-26 MED ORDER — FENTANYL CITRATE (PF) 100 MCG/2ML IJ SOLN
INTRAMUSCULAR | Status: AC
  Filled 2023-06-26: qty 2

## 2023-06-26 MED ORDER — ENOXAPARIN SODIUM 40 MG/0.4ML IJ SOSY
40.0000 mg | PREFILLED_SYRINGE | INTRAMUSCULAR | Status: DC
  Administered 2023-06-27 – 2023-07-05 (×9): 40 mg via SUBCUTANEOUS
  Filled 2023-06-26 (×10): qty 0.4

## 2023-06-26 MED ORDER — LIDOCAINE HCL 1 % IJ SOLN
10.0000 mL | Freq: Once | INTRAMUSCULAR | Status: AC
Start: 2023-06-26 — End: 2023-06-26
  Administered 2023-06-26: 10 mL via INTRADERMAL

## 2023-06-26 MED ORDER — SODIUM CHLORIDE 0.9 % IV SOLN
3.0000 g | Freq: Four times a day (QID) | INTRAVENOUS | Status: DC
  Administered 2023-06-26 – 2023-07-06 (×39): 3 g via INTRAVENOUS
  Filled 2023-06-26 (×42): qty 8

## 2023-06-26 MED ORDER — JUVEN PO PACK
1.0000 | PACK | Freq: Two times a day (BID) | ORAL | Status: DC
  Administered 2023-06-27 – 2023-07-07 (×20): 1

## 2023-06-26 MED ORDER — STERILE WATER FOR INJECTION IJ SOLN
INTRAMUSCULAR | Status: AC
  Filled 2023-06-26: qty 20

## 2023-06-26 MED ORDER — LIDOCAINE VISCOUS HCL 2 % MT SOLN
OROMUCOSAL | Status: AC
  Filled 2023-06-26: qty 15

## 2023-06-26 MED ORDER — GLUCAGON HCL RDNA (DIAGNOSTIC) 1 MG IJ SOLR
INTRAMUSCULAR | Status: AC
  Filled 2023-06-26: qty 1

## 2023-06-26 MED ORDER — GLUCAGON HCL RDNA (DIAGNOSTIC) 1 MG IJ SOLR
INTRAMUSCULAR | Status: AC | PRN
Start: 2023-06-26 — End: 2023-06-26
  Administered 2023-06-26: 1 mg via INTRAVENOUS

## 2023-06-26 MED ORDER — FENTANYL CITRATE (PF) 100 MCG/2ML IJ SOLN
INTRAMUSCULAR | Status: AC | PRN
  Administered 2023-06-26: 25 ug via INTRAVENOUS

## 2023-06-26 MED ORDER — MIDAZOLAM HCL 2 MG/2ML IJ SOLN
INTRAMUSCULAR | Status: AC | PRN
  Administered 2023-06-26: .5 mg via INTRAVENOUS

## 2023-06-26 MED ORDER — FREE WATER
30.0000 mL | Status: DC
  Administered 2023-06-26 – 2023-07-02 (×35): 30 mL

## 2023-06-26 MED ORDER — DEXTROSE 50 % IV SOLN
INTRAVENOUS | Status: AC
  Administered 2023-06-26: 50 mL
  Filled 2023-06-26: qty 50

## 2023-06-26 MED ORDER — MIDAZOLAM HCL 2 MG/2ML IJ SOLN
INTRAMUSCULAR | Status: AC
  Filled 2023-06-26: qty 2

## 2023-06-26 MED ORDER — VITAMIN C 500 MG PO TABS
500.0000 mg | ORAL_TABLET | Freq: Two times a day (BID) | ORAL | Status: DC
  Administered 2023-06-26 – 2023-07-07 (×20): 500 mg
  Filled 2023-06-26 (×22): qty 1

## 2023-06-26 MED ORDER — OSMOLITE 1.5 CAL PO LIQD
1000.0000 mL | ORAL | Status: DC
  Administered 2023-06-26 – 2023-06-30 (×3): 1000 mL

## 2023-06-26 MED ORDER — IOHEXOL 300 MG/ML  SOLN
10.0000 mL | Freq: Once | INTRAMUSCULAR | Status: AC | PRN
  Administered 2023-06-26: 10 mL

## 2023-06-26 NOTE — Progress Notes (Signed)
 INFECTIOUS DISEASE PROGRESS NOTE Date of Admission:  06/22/2023     ID: Billy Franco is a 83 y.o. male with  decub ulcer Principal Problem:   Pressure ulcer Active Problems:   Stage IV pressure ulcer of sacral region (HCC)   Pressure injury of skin with infection   Protein-calorie malnutrition, severe   Subjective: No fevers General surgery seen and no surg planned Considering PEG Tube   ROS  Eleven systems are reviewed and negative except per hpi  Medications:  Antibiotics Given (last 72 hours)     Date/Time Action Medication Dose Rate   06/23/23 2128 New Bag/Given   ceFEPIme (MAXIPIME) 2 g in sodium chloride  0.9 % 100 mL IVPB 2 g 200 mL/hr   06/24/23 0428 New Bag/Given   ceFEPIme (MAXIPIME) 2 g in sodium chloride  0.9 % 100 mL IVPB 2 g 200 mL/hr   06/24/23 1204 New Bag/Given   vancomycin (VANCOREADY) IVPB 1500 mg/300 mL 1,500 mg 150 mL/hr   06/24/23 1403 New Bag/Given   ceFEPIme (MAXIPIME) 2 g in sodium chloride  0.9 % 100 mL IVPB 2 g 200 mL/hr   06/24/23 2137 New Bag/Given   ceFEPIme (MAXIPIME) 2 g in sodium chloride  0.9 % 100 mL IVPB 2 g 200 mL/hr   06/25/23 0606 New Bag/Given   ceFEPIme (MAXIPIME) 2 g in sodium chloride  0.9 % 100 mL IVPB 2 g 200 mL/hr   06/25/23 1300 New Bag/Given   vancomycin (VANCOREADY) IVPB 1500 mg/300 mL 1,500 mg 150 mL/hr   06/25/23 1500 New Bag/Given   ceFEPIme (MAXIPIME) 2 g in sodium chloride  0.9 % 100 mL IVPB 2 g 200 mL/hr   06/25/23 2127 New Bag/Given   ceFEPIme (MAXIPIME) 2 g in sodium chloride  0.9 % 100 mL IVPB 2 g 200 mL/hr   06/26/23 0014 New Bag/Given   ceFAZolin  (ANCEF ) IVPB 2g/100 mL premix 2 g 200 mL/hr   06/26/23 1610 New Bag/Given   ceFEPIme (MAXIPIME) 2 g in sodium chloride  0.9 % 100 mL IVPB 2 g 200 mL/hr   06/26/23 1209 New Bag/Given   vancomycin (VANCOREADY) IVPB 1500 mg/300 mL 1,500 mg 150 mL/hr       Chlorhexidine  Gluconate Cloth  6 each Topical Q0600   enoxaparin  (LOVENOX ) injection  40 mg Subcutaneous Q24H   insulin   aspart  0-15 Units Subcutaneous TID WC   leptospermum manuka honey  1 Application Topical Daily   sodium chloride  flush  10-40 mL Intracatheter Q12H    Objective: Vital signs in last 24 hours: Temp:  [97.5 F (36.4 C)-99.4 F (37.4 C)] 97.5 F (36.4 C) (06/06 1339) Pulse Rate:  [70-89] 77 (06/06 1446) Resp:  [14-24] 17 (06/06 1446) BP: (94-111)/(43-69) 104/57 (06/06 1446) SpO2:  [98 %-100 %] 100 % (06/06 1446) Weight:  [63 kg] 63 kg (06/06 1339) General: Alert, cooperative, pale frail.  Head: Normocephalic, without obvious abnormality, atraumatic. Eyes: Conjunctivae clear, anicteric sclerae. Pupils are equal ENT Nares normal. No drainage or sinus tenderness. Lips, mucosa, and tongue normal. No Thrush Neck: anterior surgical scar healed well no carotid bruit and no JVD. Back: No CVA tenderness. Lungs: b/l air entry Heart: s1s2 Abdomen: Soft, non-tender,not distended. Bowel sounds normal. No masses Sacral wound    Initial wounds     Extremities: atraumatic, no cyanosis. No edema. No clubbing  heels      Lab Results Recent Labs    06/25/23 0525 06/25/23 2340  WBC 7.5 7.8  HGB 9.5* 9.2*  HCT 28.1* 28.3*  NA 138  138  140  K 2.9*  2.9* 3.7  CL 112*  111 114*  CO2 20*  20* 22  BUN 17  17 15   CREATININE 0.79  0.78 0.65    Microbiology: Results for orders placed or performed during the hospital encounter of 06/22/23  Aerobic Culture w Gram Stain (superficial specimen)     Status: None (Preliminary result)   Collection Time: 06/23/23  5:22 PM   Specimen: Wound  Result Value Ref Range Status   Specimen Description   Final    WOUND Performed at [facility], [address] [place], [place] [address]    Special Requests   Final    NONE Performed at [facility], [address] [place], [place] [address]    Gram Stain   Final    RARE WBC PRESENT, PREDOMINANTLY PMN RARE GRAM POSITIVE RODS    Culture   Final    ABUNDANT  ENTEROCOCCUS FAECALIS SUSCEPTIBILITIES TO FOLLOW Performed at [facility], [address] [place], [place] [address]    Report Status PENDING  Incomplete    Studies/Results: No results found.   Assessment/Plan: Multiple pressure wounds Sacrum stage IV- pt has been on levaquin and bactrim for 2 weeks- currently on vanco and cefepime- culture for what its worth to look for MRSA/Pseudomonas - only growing enterococcus so far ESR > 140 PICC placed Will need IV abx for several weeks if we plan to heal this wound as failing otpt therapy Pending final cx results will change cefepime to unasyn and cont vanco Likely will plan on unasyn or zosyn  as OPAT as long as no MRSA grows  The most important aspect of wound healing is off loading, air mattress, topical care, improve nutrition and prevent soiling with stool. Seen by surgical team - no surgical intervention   Cervical myelopathy and central cord syndrome with Acute neurological deficit in April following a fall necessitating ACDF C3-C5 Site healed well   Thank you very much for the consult. Will follow with you.  Eartha Gold   06/26/2023, 2:58 PM

## 2023-06-26 NOTE — Procedures (Signed)
 Pre procedural Dx: G tube placement Post procedural Dx: Same  Technically successful percutaneous G tube placement.    A representative aspirated sample was capped and sent to the laboratory for analysis.    EBL: Trace Complications: None immediate  Zettie Hillock, MD Pager #: [phone]

## 2023-06-26 NOTE — Plan of Care (Signed)

## 2023-06-26 NOTE — Consult Note (Signed)
 MEDICATION-RELATED CONSULT NOTE   IR Procedure Consult - Anticoagulant/Antiplatelet PTA/Inpatient Med List Review by Pharmacist    Procedure: percutaneous G tube placement.      Completed: 6/6 15:00   Post-Procedural bleeding risk per IR MD assessment:    Antithrombotic medications on inpatient or PTA profile prior to procedure:   enoxaparin      Recommended restart time per IR Post-Procedure Guidelines:  Restart next day.    Plan:    Enoxaparin  40 mg daily starting 6/7 22:00.

## 2023-06-26 NOTE — Inpatient Diabetes Management (Signed)
 Inpatient Diabetes Program Recommendations  AACE/ADA: New Consensus Statement on Inpatient Glycemic Control   Target Ranges:  Prepandial:   less than 140 mg/dL      Peak postprandial:   less than 180 mg/dL (1-2 hours)      Critically ill patients:  140 - 180 mg/dL    Latest Reference Range & Units 06/25/23 08:20 06/25/23 12:09 06/25/23 13:12 06/25/23 13:51 06/25/23 17:49 06/25/23 20:37  Glucose-Capillary 70 - 99 mg/dL 161 (H) 69 (L) 62 (L) 096 (H) 136 (H) 95   Review of Glycemic Control  Diabetes history: DM2 Outpatient Diabetes medications: Glipizide  2.5 mg daily, Metformin  XR 1500 mg daily Current orders for Inpatient glycemic control: Novolog  0-15 units TID with meals  Inpatient Diabetes Program Recommendations:    Insulin : Please consider decreasing Novolog  correction to 0-9 units TID with meals.  Thanks, Beacher Limerick, RN, MSN, CDCES Diabetes Coordinator Inpatient Diabetes Program [phone] (Team Pager from 8am to 5pm)

## 2023-06-26 NOTE — Consult Note (Addendum)
 Pharmacy Antibiotic Note  Billy Franco is a 83 y.o. male admitted on 06/22/2023 with Wound infection/Osteomyelitis.  Pharmacy has been consulted for cefepime and vancomycin dosing.  Today, 06/26/2023 Day #5 vancomycin and cefepime Renal: SCr 0.65 WNL, stable but due to being mainly bedbound, likely overestimating actual clearance due to decreased muscle mass WBC WNL Afebrile Wound culture swab collected by ID: E faecalis, final cx pending,  susceptibility pending  Vancomycin levels: On vancomycin 1500mg  IV q24h since 6/2 6/6 vancomycin trough at 11:44 = 24 mcg/mL (drawn prior to 5th dose)   Plan: Vancomycin trough elevated at 24 mcg/ml on 1500mg  IV q24h, reduce vancomycin to 1000mg  IV q24h.   Instructed RN to turn off the currently infusing 1500mg  into infusion.  So received ~1gm of vancomycin  Next dose will be due tomorrow at 1400 Estimated new trough = 15.3 mcg/ml and AUC 578  (goal AUC 400-600) Continue to assess renal fxn, cultures and length of therapy, etc Follow-up plan for antibiotics per ID.    Height: 5\' 7"  (170.2 cm) Weight: 63 kg (138 lb 14.2 oz) IBW/kg (Calculated) : 66.1  Temp (24hrs), Avg:98.8 F (37.1 C), Min:98.2 F (36.8 C), Max:99.4 F (37.4 C)  Recent Labs  Lab 06/22/23 1018 06/22/23 1212 06/23/23 0447 06/24/23 0256 06/25/23 0525 06/25/23 2340  WBC 8.9  --  11.9*  --  7.5 7.8  CREATININE 0.91  --  0.83 0.78 0.79  0.78 0.65  LATICACIDVEN 2.7* 2.0*  --   --   --   --     Estimated Creatinine Clearance: 63.4 mL/min (by C-G formula based on SCr of 0.65 mg/dL).    Allergies  Allergen Reactions   Codeine Rash and Other (See Comments)    Per pt "hard on my kidneys"     Lovastatin Rash and Other (See Comments)   Tape Rash    Antimicrobials this admission: 6/2 cefepime >>  6/2 vancomycin >>   Dose adjustments this admission:   Microbiology results: None  Thank you for allowing pharmacy to be a part of this patient's care.  Jameison Haji, PharmD, BCPS, BCIDP Work Cell: [phone] 06/26/2023 12:52 PM

## 2023-06-26 NOTE — Progress Notes (Addendum)
 Nutrition Follow Up Note   DOCUMENTATION CODES:   Severe malnutrition in context of acute illness/injury  INTERVENTION:   Once feeding tube in place:  Osmolite 1.5@65ml /hr- Initiate at 2ml/hr and increase by 10ml/hr q 8 hours until goal rate is reached.   Free water  flushes 50ml q4 hours to maintain tube patency   Regimen provides 2340kcal/day, 98g/day protein and 1450ml/day of free water .   Pt at high refeed risk; recommend monitor potassium, magnesium  and phosphorus labs daily until stable  Juven Fruit Punch BID via tube, each serving provides 95kcal and 2.5g of protein (amino acids glutamine and arginine)  Vitamin C 500mg  BID via tube  Daily weights   NUTRITION DIAGNOSIS:   Severe Malnutrition related to acute illness as evidenced by severe fat depletion, severe muscle depletion, 11 percent weight loss in 5 months.  GOAL:   Patient will meet greater than or equal to 90% of their needs -not met   MONITOR:   Labs, Weight trends, I & O's, Skin, Diet advancement, TF tolerance  ASSESSMENT:   83 y/o male with h/o GERD, chronic back pain, DM, HTN, Afib, RLS, CAD s/p PCI and stent, CKD III, CHF with EF 25-30%, refused AICD, ischemic cardiomyopathy, gout and recent admission for cervical stenosis and myelopathy after fall s/p C3-5 ACDF 4/21 complicated by acute metabolic encephalopathy with acute hypoxic respiratory failure due to presumed aspiration requiring intubation and mechanical ventilation for airway protection and who is now admitted for FTT (with dysphagia, nausea and vomiting) and osteomyeltitis sacral wound.  Pt s/p IR G-tube today. Will plan to initiate tube feeds. Pt is at high refeed risk. Recommend for patient to be tolerating goal rate and refeed labs stable prior to discharge. Per chart, pt is up ~5lbs since admission. Plan is for SNF at discharge.   Medications reviewed and include: lovenox , insulin , unasyn, vancomycin   Labs reviewed: K 3.7 wnl, P 2.5 wnl,  Mg 2.1 wnl Hgb 9.2(L), Hct 28.3(L) Cbgs- 84, 90 x 24 hrs   Diet Order:   Diet Order             Diet NPO time specified Except for: Sips with Meds  Diet effective midnight                  EDUCATION NEEDS:   Education needs have been addressed  Skin:  Skin Assessment: Reviewed RN Assessment  -Sacrum with chronic Stage 4 pressure injury -Left buttock wound is Unstageable -Left hip is Unstageable -Left heel Unstageable  -Right heel Unstageable   Last BM:  6/5- type 4  Height:   Ht Readings from Last 1 Encounters:  06/26/23 5\' 7"  (1.702 m)    Weight:   Wt Readings from Last 1 Encounters:  06/26/23 63 kg    Ideal Body Weight:  67.2 kg  BMI:  Body mass index is 21.75 kg/m.  Estimated Nutritional Needs:   Kcal:  1900-2200kcal/day  Protein:  95-110g/day  Fluid:  1.6-1.8L/day  Torrance Freestone MS, RD, LDN If unable to be reached, please send secure chat to "RD inpatient" available from 8:00a-4:00p daily

## 2023-06-26 NOTE — Progress Notes (Signed)
 OT Cancellation Note  Patient Details Name: Jovin Fester MRN: [id number] DOB: 1940/12/12   Cancelled Treatment:    Reason Eval/Treat Not Completed: Patient at procedure or test/ unavailable. Plan for PEG tube this date. Will follow up following PEG tube placement.   Mckaylee Dimalanta R., MPH, MS, OTR/L ascom [phone] 06/26/23, 11:14 AM

## 2023-06-26 NOTE — Progress Notes (Signed)
 PT Cancellation Note  Patient Details Name: Billy Franco MRN: [id number] DOB: [birth year]   Cancelled Treatment:    Reason Eval/Treat Not Completed: Patient at procedure or test/unavailable Plan for PEG tube this date. Will follow up following PEG tube placement.   Janine Melbourne, PT, DPT Physical Therapist - [place]  [facility]    [name] 06/26/2023, 11:03 AM

## 2023-06-26 NOTE — Progress Notes (Signed)
 Progress Note   Patient: Billy Franco WUJ:[id number] DOB: 11-07-1940 DOA: 06/22/2023     4 DOS: the patient was seen and examined on 06/26/2023    Brief hospital course: Billy Franco is a 83 y.o. 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 Upon SLP, barium swallow study- patient has severe pharyngeal musculature weakness and high risk of aspiration so made NPO and will be pursuing PEG- has ultimately been very malnourished and had significant weight loss which impacts wound healing and ability to work with PT to increase mobility.  Also reports orthostatic hypotension impacting PT participation and was inexplicably on multiple antihypertensives and midodrine simultaneously. Holding all meds he is on borderline lower limits of normal BP and was able to participate with PT.    ED Course: Afebrile, nontachycardic nonhypotensive nonhypoxic.  CT abdominal pelvis showed no acute findings seen stomach.  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  Blood work showed BUN 13 creatinine 0.9.   Initial treatment included vancomycin and cefepime.   Assessment and Plan:    Stage IV sacral ulcer  sacral osteomyelitis- refer to clinical images and WOC note description on 6/3 for further details.  Continue antibiotics - wound care as recommended by [facility] team - RD consulted as patient is significantly malnourished.  - Infectious disease on board PICC line requested   Severe malnutrition Dietitian on board   Neurogenic  Dysphagia, worsening- recently discharged on full diet and at SNF declined to tolerating pureed diet only. Still having aspirations. SLP evaluated with barium swallow 6/3 and has SEVERE pharyngeal phase dysphagia. Severely high risk of aspiration. Made him NPO immediately and started on IV fluids while awaiting evaluation for PEG placement. Discussed this plan with patient and wife who are agreeable to this plan. His dysphagia is not likely to be reversible in short term or ever since I suspect related to innervation dysfunction from recent cervical spine compression and neurosurgery procedure sequelae.  - I would continue to allow ice chips to continue to promote use of swallow musculature but otherwise NPO to avoid aspiration.  - neurosurgery consulted to evaluate and reviewed swallow study, Dr Felipe Horton with see patient to discuss if further evaluation/intervention is needed. Patient underwent PEG tube placement today Dietitian resuming feeds via tube feeding - Speech therapy and dietitian on board   Unintentional weight loss  Failure to thrive- in setting of malnutrition, dysphagia - treat as above - PT/OT, RD   Orthostatic hypotension-  Continue PT OT Monitor orthostatic blood pressure closely   Anemia of chronic disease- baseline hgb around 10.5 Continue to monitor CBC Follow-up on anemia panel am.    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   VTE ppx: enoxaparin  (  LOVENOX ) injection 40 mg Start: 06/22/23 1800       Subjective:  Patient seen and examined in the presence of the wife Patient underwent PEG tube  placement today Dietitian resuming feeds via tube feeding Patient denies abdominal pain chest pain or cough   Physical Exam:  General: awake, alert, NAD. Malnourished, elderly male HEENT: atraumatic, clear conjunctiva, anicteric sclera, MMM, hearing grossly normal. Hoarse voice Respiratory: normal respiratory effort. Cardiovascular: quick capillary refill, normal S1/S2, RRR, no JVD, murmurs Gastrointestinal: soft, NT, ND Nervous: A&O x3. All limbs moving spontaneously Extremities: moves all equally, no edema, low muscle tone Skin: see clinical images for details of wounds on hip/sacrum, heels Psychiatry: normal mood, congruent affect   Data Reviewed:     Vitals:   06/26/23 1520 06/26/23 1530 06/26/23 1545 06/26/23 1615  BP: 100/60 (!) 109/52 (!) 109/58 114/61  Pulse: 72 70 68 70  Resp: (!) 29 13 13 18   Temp:    97.8 F (36.6 C)  TempSrc:    Oral  SpO2: 98% 98% 100% 100%  Weight:      Height:          Latest Ref Rng & Units 06/25/2023   11:40 PM 06/25/2023    5:25 AM 06/23/2023    4:47 AM  CBC  WBC 4.0 - 10.5 K/uL 7.8  7.5  11.9   Hemoglobin 13.0 - 17.0 g/dL 9.2  9.5  9.8   Hematocrit 39.0 - 52.0 % 28.3  28.1  29.2   Platelets 150 - 400 K/uL 334  359  359        Latest Ref Rng & Units 06/25/2023   11:40 PM 06/25/2023    5:25 AM 06/24/2023    2:56 AM  BMP  Glucose 70 - 99 mg/dL 71  161    096  045   BUN 8 - 23 mg/dL 15  17    17  18    Creatinine 0.61 - 1.24 mg/dL 4.09  8.11    9.14  7.82   Sodium 135 - 145 mmol/L 140  138    138  136   Potassium 3.5 - 5.1 mmol/L 3.7  2.9    2.9  3.7   Chloride 98 - 111 mmol/L 114  111    112  105   CO2 22 - 32 mmol/L 22  20    20  23    Calcium  8.9 - 10.3 mg/dL 8.5  8.3    8.3  8.9      Author: Ezzard Holms, MD 06/26/2023 5:35 PM  For on call review [email or web address].

## 2023-06-26 NOTE — Progress Notes (Signed)
 Patient clinically stable post IR G tube placement per Dr Burna Carrier, tolerated  well. Vitals stable pre and post procedure. Received Versed  0.5 mg along with Fentanyl  25 mcg IV for procedure. Report given to Merribeth Abbott Rn post procedure/specials/19

## 2023-06-26 NOTE — Progress Notes (Signed)
 Patient stable. Bedside handoff given to Allie RN

## 2023-06-27 DIAGNOSIS — E43 Unspecified severe protein-calorie malnutrition: Secondary | ICD-10-CM | POA: Diagnosis not present

## 2023-06-27 LAB — BASIC METABOLIC PANEL WITH GFR
Anion gap: 6 (ref 5–15)
BUN: 16 mg/dL (ref 8–23)
CO2: 22 mmol/L (ref 22–32)
Calcium: 8.8 mg/dL — ABNORMAL LOW (ref 8.9–10.3)
Chloride: 110 mmol/L (ref 98–111)
Creatinine, Ser: 0.61 mg/dL (ref 0.61–1.24)
GFR, Estimated: >60 mL/min (ref >60)
Glucose, Bld: 230 mg/dL — ABNORMAL HIGH (ref 70–99)
Potassium: 3.6 mmol/L (ref 3.5–5.1)
Sodium: 138 mmol/L (ref 135–145)

## 2023-06-27 LAB — CBC WITH DIFFERENTIAL/PLATELET
Abs Immature Granulocytes: 0.09 10*3/uL — ABNORMAL HIGH (ref 0.00–0.07)
Basophils Absolute: 0 10*3/uL (ref 0.0–0.1)
Basophils Relative: 0 %
Eosinophils Absolute: 0.5 10*3/uL (ref 0.0–0.5)
Eosinophils Relative: 5 %
HCT: 29.8 % — ABNORMAL LOW (ref 39.0–52.0)
Hemoglobin: 9.7 g/dL — ABNORMAL LOW (ref 13.0–17.0)
Immature Granulocytes: 1 %
Lymphocytes Relative: 8 %
Lymphs Abs: 0.8 10*3/uL (ref 0.7–4.0)
MCH: 30.1 pg (ref 26.0–34.0)
MCHC: 32.6 g/dL (ref 30.0–36.0)
MCV: 92.5 fL (ref 80.0–100.0)
Monocytes Absolute: 0.6 10*3/uL (ref 0.1–1.0)
Monocytes Relative: 6 %
Neutro Abs: 8 10*3/uL — ABNORMAL HIGH (ref 1.7–7.7)
Neutrophils Relative %: 80 %
Platelets: 342 10*3/uL (ref 150–400)
RBC: 3.22 MIL/uL — ABNORMAL LOW (ref 4.22–5.81)
RDW: 14 % (ref 11.5–15.5)
WBC: 10 10*3/uL (ref 4.0–10.5)
nRBC: 0 % (ref 0.0–0.2)

## 2023-06-27 LAB — PHOSPHORUS: Phosphorus: 1.8 mg/dL — ABNORMAL LOW (ref 2.5–4.6)

## 2023-06-27 LAB — MAGNESIUM: Magnesium: 1.8 mg/dL (ref 1.7–2.4)

## 2023-06-27 LAB — GLUCOSE, CAPILLARY
Glucose-Capillary: 199 mg/dL — ABNORMAL HIGH (ref 70–99)
Glucose-Capillary: 237 mg/dL — ABNORMAL HIGH (ref 70–99)
Glucose-Capillary: 156 mg/dL — ABNORMAL HIGH (ref 70–99)
Glucose-Capillary: 187 mg/dL — ABNORMAL HIGH (ref 70–99)

## 2023-06-27 MED ORDER — POTASSIUM PHOSPHATES 15 MMOLE/5ML IV SOLN
30.0000 mmol | Freq: Once | INTRAVENOUS | Status: AC
  Administered 2023-06-27: 30 mmol via INTRAVENOUS
  Filled 2023-06-27 (×2): qty 10

## 2023-06-27 NOTE — Progress Notes (Signed)
 Physical Therapy Treatment, Co-Treat with OT Patient Details Name: Billy Franco MRN: [id number] DOB: [birth year] Today's Date: 06/27/2023   History of Present Illness Pt is an 83 y.o. male presenting to hospital with N/V and dehydration; pt also with non-healing sacral wound.  Recent hospitalization s/p fall and s/p C3-5 ACDF 05/11/23 (pt with central cord syndrome).  Pt admitted with infected stage IV sacral ulcer, chronic osteomyelitis, worsening dysphagia, intractable N/V, unintentional weight loss, FTT, and chronic urinary retention.  PMH includes DM, CKD, htn, HLD, CHF, CAD, a-fib, chronic back pain, C3-5 ACDF.    PT Comments  Patient tolerated session fair. He reports increased pain, RN notified requesting pain meds. Patient highly motivated and agreeable to mobility. Upon sitting edge of bed, he exhibits significant stiffness and tightness in lower extremities. PT performed PROM progressing to partial ROM with AAROM. Patient able to achieve better base of support after exercise. He requires +2 assist with difficulty maintaining midline in sitting while moving legs. Patient required max A +2 for standing edge of bed but was able to maintain standing for 5-10 sec each repetition. BP monitored throughout session with improved BP during 2nd stand. Semi supine: BP 105/44 (MAP59), HR 104 bpm Sitting on the EOB: BP 92/46 (MAP60), HR 103bpm Sitting post standing (unable to tolerate standing BP: BP 96/57. Patient reports minimal dizziness throughout session but does become heavily fatigued with prolonged edge of bed positioning. Patient transferred back to bed in sidelying position. PT educated caregiver how to perform PROM/AAROM for hip/knee flexion/extension to facilitate better mobility. Patient/caregiver verbalized understanding. Patient would benefit from additional skilled PT intervention to improve strength and mobility.     If plan is discharge home, recommend the following: Two people to help with  walking and/or transfers;Two people to help with bathing/dressing/bathroom;Assistance with cooking/housework;Assistance with feeding;Assist for transportation;Help with stairs or ramp for entrance;Supervision due to cognitive status   Can travel by private vehicle     No  Equipment Recommendations  Other (comment)    Recommendations for Other Services       Precautions / Restrictions Precautions Precautions: Fall;Cervical Precaution Booklet Issued: No Recall of Precautions/Restrictions: Impaired Precaution/Restrictions Comments: Hard collar when OOB (apply in sitting) Required Braces or Orthoses: Cervical Brace Cervical Brace: Hard collar;Other (comment) Restrictions Weight Bearing Restrictions Per Provider Order: No     Mobility  Bed Mobility Overal bed mobility: Needs Assistance Bed Mobility: Sidelying to Sit, Sit to Sidelying   Sidelying to sit: Max assist, +2 for physical assistance, HOB elevated     Sit to sidelying: Max assist, +2 for physical assistance, HOB elevated General bed mobility comments: assist for trunk and B LE's    Transfers Overall transfer level: Needs assistance Equipment used: 2 person hand held assist Transfers: Sit to/from Stand Sit to Stand: Max assist, +2 physical assistance           General transfer comment: MAXA +2 with bilateral knee blocking, able to stand 5-10 sec with max A +2    Ambulation/Gait               General Gait Details: unable at this time   Stairs             Wheelchair Mobility     Tilt Bed    Modified Rankin (Stroke Patients Only)       Balance Overall balance assessment: Needs assistance Sitting-balance support: Single extremity supported, Feet supported Sitting balance-Leahy Scale: Poor Sitting balance - Comments: Initial maxA for static sitting on  the EOB, progressed to modA with bilateral UE support Postural control: Posterior lean, Left lateral lean Standing balance support:  Bilateral upper extremity supported Standing balance-Leahy Scale: Poor Standing balance comment: maxAx2 to maintain, and bil knee blocking                            Communication Communication Communication: No apparent difficulties Factors Affecting Communication: Reduced clarity of speech  Cognition Arousal: Alert Behavior During Therapy: WFL for tasks assessed/performed                             Following commands: Impaired Following commands impaired: Follows one step commands with increased time    Cueing Cueing Techniques: Verbal cues  Exercises Other Exercises Other Exercises: Upon sitting edge of bed, PT performed PROM to BLE knee extension x5 reps, progressing to AAROM x5 reps; Initially patient unable to activate quad for extending legs but with increased ROM, able to progress to partial ROM with AAROM; demonstrates increased flexibility in LLE compared to RLE; Patient highly motivated Other Exercises: Upon returning to bed, patient positioned in sidelying due to wounds and for comfort; Educated wife how to perform PROM/AAROM to top leg for hip flexion/extension and knee extension/flexion for increased muscle activation (x10 min); wife verbalized understanding;    General Comments General comments (skin integrity, edema, etc.): Noted bleeding on R heel- RN notified; Sacra wounds dressing intact pre/post session; PEG tube intact at end of session      Pertinent Vitals/Pain Pain Assessment Pain Assessment: 0-10 Pain Score: 10-Worst pain ever Pain Location: back/trunk pain Pain Intervention(s): Monitored during session, Repositioned, Limited activity within patient's tolerance, Patient requesting pain meds-RN notified    Home Living                          Prior Function            PT Goals (current goals can now be found in the care plan section) Acute Rehab PT Goals Patient Stated Goal: to improve functional mobility PT Goal  Formulation: With patient Time For Goal Achievement: 07/07/23 Potential to Achieve Goals: Fair Progress towards PT goals: Progressing toward goals    Frequency    Min 2X/week      PT Plan      Co-evaluation   Reason for Co-Treatment: Complexity of the patient's impairments (multi-system involvement);For patient/therapist safety;To address functional/ADL transfers PT goals addressed during session: Mobility/safety with mobility;Balance;Strengthening/ROM OT goals addressed during session: ADL's and self-care      AM-PAC PT "6 Clicks" Mobility   Outcome Measure  Help needed turning from your back to your side while in a flat bed without using bedrails?: A Lot Help needed moving from lying on your back to sitting on the side of a flat bed without using bedrails?: A Lot Help needed moving to and from a bed to a chair (including a wheelchair)?: A Lot Help needed standing up from a chair using your arms (e.g., wheelchair or bedside chair)?: A Lot Help needed to walk in hospital room?: Total Help needed climbing 3-5 steps with a railing? : Total 6 Click Score: 10    End of Session Equipment Utilized During Treatment: Cervical collar;Gait belt Activity Tolerance: Patient limited by fatigue;Patient limited by pain Patient left: in bed;with call bell/phone within reach;with bed alarm set;with family/visitor present;Other (comment) Nurse Communication: Mobility status;Precautions PT  Visit Diagnosis: Other abnormalities of gait and mobility (R26.89);History of falling (Z91.81);Other symptoms and signs involving the nervous system (R29.898);Pain     Time: 0855-1000 PT Time Calculation (min) (ACUTE ONLY): 65 min  Charges:    $Therapeutic Exercise: 8-22 mins $Therapeutic Activity: 8-22 mins PT General Charges $$ ACUTE PT VISIT: 1 Visit                        Arnav Cregg PT, DPT 06/27/2023, 4:43 PM

## 2023-06-27 NOTE — Progress Notes (Signed)
 Progress Note   Patient: Billy Franco ZOX:[id number] DOB: [birth year] DOA: 06/22/2023     5 DOS: the patient was seen and examined on 06/27/2023      Brief hospital course: Billy Franco is a 83 y.o. 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 Upon SLP, barium swallow study- patient has severe pharyngeal musculature weakness and high risk of aspiration so made NPO and will be pursuing PEG- has ultimately been very malnourished and had significant weight loss which impacts wound healing and ability to work with PT to increase mobility.  Also reports orthostatic hypotension impacting PT participation and was inexplicably on multiple antihypertensives and midodrine simultaneously. Holding all meds he is on borderline lower limits of normal BP and was able to participate with PT.    ED Course: Afebrile, nontachycardic nonhypotensive nonhypoxic.  CT abdominal pelvis showed no acute findings seen stomach.  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  Blood work showed BUN 13 creatinine 0.9.   Initial treatment included vancomycin  and cefepime .   Assessment and Plan:    Stage IV sacral ulcer  sacral osteomyelitis- refer to clinical images and WOC note description on 6/3 for further details.   - wound care as recommended by [facility] team - RD consulted as patient is significantly malnourished.  Continue current antibiotics Infectious disease on board   Severe malnutrition Dietitian on board   Neurogenic Dysphagia,  worsening- recently discharged on full diet and at SNF declined to tolerating pureed diet only. Still having aspirations. SLP evaluated with barium swallow 6/3 and has SEVERE pharyngeal phase dysphagia. Severely high risk of aspiration. Made him NPO immediately and started on IV fluids while awaiting evaluation for PEG placement. Discussed this plan with patient and wife who are agreeable to this plan. His dysphagia is not likely to be reversible in short term or ever since I suspect related to innervation dysfunction from recent cervical spine compression and neurosurgery procedure sequelae.  - I would continue to allow ice chips to continue to promote use of swallow musculature but otherwise NPO to avoid aspiration.  - neurosurgery consulted to evaluate and reviewed swallow study, Dr Felipe Horton with see patient to discuss if further evaluation/intervention is needed. Patient underwent PEG tube placement today Dietitian resuming feeds via tube feeding - Speech therapy and dietitian on board   Unintentional weight loss  Failure to thrive- in setting of malnutrition, dysphagia - treat as above - PT/OT, RD   Orthostatic hypotension-  Continue PT OT Monitor orthostatic blood pressure closely   Anemia of chronic disease- baseline hgb around 10.5 Continue to monitor CBC Follow-up on anemia panel am.    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   VTE  ppx: enoxaparin  (LOVENOX ) injection 40 mg Start: 06/22/23 1800       Subjective:  Patient seen and examined in the presence of the wife Patient underwent PEG tube  placement today Dietitian resuming feeds via tube feeding Patient denies abdominal pain chest pain or cough   Physical Exam:  General: awake, alert, NAD. Malnourished, elderly male HEENT: atraumatic, clear conjunctiva, anicteric sclera, MMM, hearing grossly normal. Hoarse voice Respiratory: normal respiratory effort. Cardiovascular: quick capillary refill, normal S1/S2, RRR, no JVD, murmurs Gastrointestinal: soft, NT, ND Nervous: A&O x3. All limbs moving spontaneously Extremities: moves all equally, no edema, low muscle tone Skin: see clinical images for details of wounds on hip/sacrum, heels Psychiatry: normal mood, congruent affect   Data Reviewed:     Latest Ref Rng & Units 06/27/2023    5:48 AM 06/25/2023   11:40 PM 06/25/2023    5:25 AM  CBC  WBC 4.0 - 10.5 K/uL 10.0  7.8  7.5   Hemoglobin 13.0 - 17.0 g/dL 9.7  9.2  9.5   Hematocrit 39.0 - 52.0 % 29.8  28.3  28.1   Platelets 150 - 400 K/uL 342  334  359        Latest Ref Rng & Units 06/27/2023    5:48 AM 06/25/2023   11:40 PM 06/25/2023    5:25 AM  BMP  Glucose 70 - 99 mg/dL 865  71  784    696   BUN 8 - 23 mg/dL 16  15  17    17    Creatinine 0.61 - 1.24 mg/dL 2.95  2.84  1.32    4.40   Sodium 135 - 145 mmol/L 138  140  138    138   Potassium 3.5 - 5.1 mmol/L 3.6  3.7  2.9    2.9   Chloride 98 - 111 mmol/L 110  114  111    112   CO2 22 - 32 mmol/L 22  22  20    20    Calcium  8.9 - 10.3 mg/dL 8.8  8.5  8.3    8.3     Vitals:   06/26/23 2013 06/27/23 0418 06/27/23 0418 06/27/23 0734  BP: 118/63 (!) 97/49 (!) 97/49 (!) 105/45  Pulse: 87 94 94 (!) 101  Resp: 18 16 16 20   Temp: 97.8 F (36.6 C) 97.7 F (36.5 C) 97.7 F (36.5 C) 98.7 F (37.1 C)  TempSrc: Oral Oral Oral   SpO2: 97% 100% 100% (!) 76%  Weight:      Height:         Author: Ezzard Holms, MD 06/27/2023 3:49 PM  For on call review [email or web address].

## 2023-06-27 NOTE — Progress Notes (Addendum)
 Occupational Therapy Treatment Patient Details Name: Billy Franco MRN: [id number] DOB: 1940/04/09 Today's Date: 06/27/2023   History of present illness Pt is an 83 y.o. male presenting to hospital with N/V and dehydration; pt also with non-healing sacral wound.  Recent hospitalization s/p fall and s/p C3-5 ACDF 05/11/23 (pt with central cord syndrome).  Pt admitted with infected stage IV sacral ulcer, chronic osteomyelitis, worsening dysphagia, intractable N/V, unintentional weight loss, FTT, and chronic urinary retention.  PMH includes DM, CKD, htn, HLD, CHF, CAD, a-fib, chronic back pain, C3-5 ACDF.   OT comments  Pt seen for OT/PT treatment on this date. Upon arrival to room pt semi supine with wife at bedside, agreeable to tx. Pt requires MAXA for bed mobility, MAXA don bilateral socks while seated on EOB with external supports to remain upright, MAXA +2 for STS on the EOB with bilateral knee blocking, x2 standing attempts. Pt initial required MAXA to remain upright while seated on the EOB, progressed to MODA to prevent leaning (noted L lateral lean in sitting). Pt requires hand over hand assistance to complete face washing at bed level due to limited UE ROM, pt stated wife assisted with oral care prior to session. Pt is unable to take steps on this date but tolerated sitting on the EOB ~25 mins throughout the session. Pt is very eager to return to PLOF. Pt making good progress toward goals, will continue to follow POC. Discharge recommendation remains appropriate.    BP during session: Semi supine: BP 105/44 (MAP59), HR 104 bpm Sitting on the EOB: BP 92/46 (MAP60), HR 103bpm Sitting post standing (unable to tolerate standing BP: BP 96/57      If plan is discharge home, recommend the following:  Two people to help with walking and/or transfers;A lot of help with bathing/dressing/bathroom;Direct supervision/assist for medications management;Assist for transportation;Assistance with  cooking/housework;Help with stairs or ramp for entrance;Assistance with feeding   Equipment Recommendations  Other (comment)    Recommendations for Other Services Rehab consult    Precautions / Restrictions Precautions Precautions: Fall;Cervical Precaution Booklet Issued: No Recall of Precautions/Restrictions: Impaired Precaution/Restrictions Comments: Hard collar when OOB (apply in sitting) Required Braces or Orthoses: Cervical Brace Cervical Brace: Hard collar;Other (comment) Restrictions Weight Bearing Restrictions Per Provider Order: No       Mobility Bed Mobility Overal bed mobility: Needs Assistance Bed Mobility: Sidelying to Sit, Sit to Sidelying   Sidelying to sit: Max assist, HOB elevated     Sit to sidelying: Max assist, HOB elevated General bed mobility comments: assist for trunk and B LE's    Transfers Overall transfer level: Needs assistance Equipment used: 2 person hand held assist Transfers: Sit to/from Stand Sit to Stand: Max assist, +2 physical assistance           General transfer comment: MAXA +2 with bilateral knee blocking     Balance Overall balance assessment: Needs assistance Sitting-balance support: Single extremity supported, Feet supported Sitting balance-Leahy Scale: Poor Sitting balance - Comments: Initial maxA for static sitting on the EOB, progressed to modA with bilateral UE support Postural control: Posterior lean, Left lateral lean Standing balance support: Bilateral upper extremity supported Standing balance-Leahy Scale: Poor Standing balance comment: maxAx2 to maintain, and bil knee blocking                           ADL either performed or assessed with clinical judgement   ADL Overall ADL's : Needs assistance/impaired Eating/Feeding: NPO  Grooming: Oral care;Bed level;Wash/dry face Grooming Details (indicate cue type and reason): Wife completed oral care prior to session, pt attempted to wash face with hand  over hand assistance             Lower Body Dressing: Maximal assistance Lower Body Dressing Details (indicate cue type and reason): Donning socks while seated on EOB             Functional mobility during ADLs: Maximal assistance General ADL Comments: +2 assistance for standing attemtps from EOB, hand over hand assist for bed level grooming tasks.    Communication Communication Communication: No apparent difficulties Factors Affecting Communication: Reduced clarity of speech   Cognition Arousal: Alert Behavior During Therapy: WFL for tasks assessed/performed               OT - Cognition Comments: Seemly more alert than previous session per chart review                 Following commands: Impaired Following commands impaired: Follows one step commands with increased time      Cueing   Cueing Techniques: Verbal cues  Exercises Exercises: Other exercises Other Exercises Other Exercises: Edu: Role of treatmemt, safe bed mobility via log roll technique    Shoulder Instructions       General Comments Noted bleeding on R heel - RN notified, sacral wound/feel/heels dressings intact pre/post session, feeding tube intact as well.    Pertinent Vitals/ Pain       Pain Assessment Pain Assessment: 0-10 Pain Score: 10-Worst pain ever ("50/10") Pain Location:  (Abdoman pain) Pain Descriptors / Indicators: Discomfort Pain Intervention(s): Limited activity within patient's tolerance, Repositioned  Home Living                                          Prior Functioning/Environment              Frequency  Min 2X/week        Progress Toward Goals  OT Goals(current goals can now be found in the care plan section)  Progress towards OT goals: Progressing toward goals  Acute Rehab OT Goals Patient Stated Goal: get stronger and walk again OT Goal Formulation: With patient Time For Goal Achievement: 07/07/23 Potential to Achieve Goals:  Fair ADL Goals Pt Will Perform Eating: sitting;with adaptive utensils;with contact guard assist Pt Will Perform Grooming: sitting;with contact guard assist;with adaptive equipment Pt Will Transfer to Toilet: with max assist;with +2 assist;stand pivot transfer;bedside commode;squat pivot transfer  Plan      Co-evaluation    PT/OT/SLP Co-Evaluation/Treatment: Yes Reason for Co-Treatment: Complexity of the patient's impairments (multi-system involvement);For patient/therapist safety;To address functional/ADL transfers PT goals addressed during session: Mobility/safety with mobility;Balance OT goals addressed during session: ADL's and self-care      AM-PAC OT "6 Clicks" Daily Activity     Outcome Measure   Help from another person eating meals?: Total Help from another person taking care of personal grooming?: Total Help from another person toileting, which includes using toliet, bedpan, or urinal?: Total Help from another person bathing (including washing, rinsing, drying)?: Total Help from another person to put on and taking off regular upper body clothing?: Total Help from another person to put on and taking off regular lower body clothing?: Total 6 Click Score: 6    End of Session Equipment Utilized During Treatment: Gait belt;Oxygen  OT  Visit Diagnosis: Other abnormalities of gait and mobility (R26.89);Hemiplegia and hemiparesis;Feeding difficulties (R63.3) Hemiplegia - Right/Left: Right Hemiplegia - dominant/non-dominant: Dominant Hemiplegia - caused by: Other cerebrovascular disease   Activity Tolerance Patient tolerated treatment well   Patient Left in bed;with call bell/phone within reach;with family/visitor present   Nurse Communication Mobility status;Other (comment) (Bleeding on R heel)        Time: 1610-9604 OT Time Calculation (min): 63 min  Charges: OT General Charges $OT Visit: 1 Visit OT Treatments $Self Care/Home Management : 8-22 mins $Therapeutic  Activity: 8-22 mins  Rosaria Common M.S. OTR/L  06/27/23, 11:07 AM

## 2023-06-28 DIAGNOSIS — E43 Unspecified severe protein-calorie malnutrition: Secondary | ICD-10-CM | POA: Diagnosis not present

## 2023-06-28 LAB — BASIC METABOLIC PANEL WITH GFR
Anion gap: 6 (ref 5–15)
BUN: 28 mg/dL — ABNORMAL HIGH (ref 8–23)
CO2: 22 mmol/L (ref 22–32)
Calcium: 8.5 mg/dL — ABNORMAL LOW (ref 8.9–10.3)
Chloride: 110 mmol/L (ref 98–111)
Creatinine, Ser: 0.62 mg/dL (ref 0.61–1.24)
GFR, Estimated: >60 mL/min (ref >60)
Glucose, Bld: 235 mg/dL — ABNORMAL HIGH (ref 70–99)
Potassium: 3.7 mmol/L (ref 3.5–5.1)
Sodium: 138 mmol/L (ref 135–145)

## 2023-06-28 LAB — CBC WITH DIFFERENTIAL/PLATELET
Abs Immature Granulocytes: 0.09 10*3/uL — ABNORMAL HIGH (ref 0.00–0.07)
Basophils Absolute: 0 10*3/uL (ref 0.0–0.1)
Basophils Relative: 0 %
Eosinophils Absolute: 0.6 10*3/uL — ABNORMAL HIGH (ref 0.0–0.5)
Eosinophils Relative: 6 %
HCT: 26.2 % — ABNORMAL LOW (ref 39.0–52.0)
Hemoglobin: 8.8 g/dL — ABNORMAL LOW (ref 13.0–17.0)
Immature Granulocytes: 1 %
Lymphocytes Relative: 11 %
Lymphs Abs: 1.1 10*3/uL (ref 0.7–4.0)
MCH: 31.1 pg (ref 26.0–34.0)
MCHC: 33.6 g/dL (ref 30.0–36.0)
MCV: 92.6 fL (ref 80.0–100.0)
Monocytes Absolute: 0.6 10*3/uL (ref 0.1–1.0)
Monocytes Relative: 6 %
Neutro Abs: 7.4 10*3/uL (ref 1.7–7.7)
Neutrophils Relative %: 76 %
Platelets: 320 10*3/uL (ref 150–400)
RBC: 2.83 MIL/uL — ABNORMAL LOW (ref 4.22–5.81)
RDW: 14.2 % (ref 11.5–15.5)
WBC: 9.8 10*3/uL (ref 4.0–10.5)
nRBC: 0 % (ref 0.0–0.2)

## 2023-06-28 LAB — GLUCOSE, CAPILLARY
Glucose-Capillary: 246 mg/dL — ABNORMAL HIGH (ref 70–99)
Glucose-Capillary: 117 mg/dL — ABNORMAL HIGH (ref 70–99)
Glucose-Capillary: 117 mg/dL — ABNORMAL HIGH (ref 70–99)

## 2023-06-28 LAB — AEROBIC CULTURE W GRAM STAIN (SUPERFICIAL SPECIMEN)

## 2023-06-28 LAB — MAGNESIUM: Magnesium: 1.7 mg/dL (ref 1.7–2.4)

## 2023-06-28 LAB — PHOSPHORUS: Phosphorus: 2.5 mg/dL (ref 2.5–4.6)

## 2023-06-28 NOTE — Progress Notes (Signed)
 Progress Note   Patient: Billy Franco ZOX:[id number] DOB: 02-Sep-1940 DOA: 06/22/2023     6 DOS: the patient was seen and examined on 06/28/2023     Brief hospital course: Billy Franco is a 83 y.o. 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 Upon SLP, barium swallow study- patient has severe pharyngeal musculature weakness and high risk of aspiration so made NPO and will be pursuing PEG- has ultimately been very malnourished and had significant weight loss which impacts wound healing and ability to work with PT to increase mobility.  Also reports orthostatic hypotension impacting PT participation and was inexplicably on multiple antihypertensives and midodrine simultaneously. Holding all meds he is on borderline lower limits of normal BP and was able to participate with PT.    ED Course: Afebrile, nontachycardic nonhypotensive nonhypoxic.  CT abdominal pelvis showed no acute findings seen stomach.  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  Blood work showed BUN 13 creatinine 0.9.   Initial treatment included vancomycin  and cefepime .   Assessment and Plan:    Stage IV sacral ulcer  sacral osteomyelitis- refer to clinical images and WOC note description on 6/3 for further details.    - wound care as recommended by [facility] team - RD consulted as patient is significantly malnourished.  Continue current antibiotics Infectious disease on board PT OT has recommended acute rehab placement   Severe  malnutrition Dietitian on board   Neurogenic Dysphagia, worsening- recently discharged on full diet and at SNF declined to tolerating pureed diet only. Still having aspirations. SLP evaluated with barium swallow 6/3 and has SEVERE pharyngeal phase dysphagia. Severely high risk of aspiration. Made him NPO immediately and started on IV fluids while awaiting evaluation for PEG placement. Discussed this plan with patient and wife who are agreeable to this plan. His dysphagia is not likely to be reversible in short term or ever since I suspect related to innervation dysfunction from recent cervical spine compression and neurosurgery procedure sequelae.  - I would continue to allow ice chips to continue to promote use of swallow musculature but otherwise NPO to avoid aspiration.  - neurosurgery consulted to evaluate and reviewed swallow study Patient underwent PEG tube placement on 06/26/2023 Continue tube feeding - Speech therapy and dietitian on board   Unintentional weight loss  Failure to thrive- in setting of malnutrition, dysphagia - treat as above - PT/OT, RD   Orthostatic hypotension-  Continue PT OT Monitor orthostatic blood pressure closely   Anemia of chronic disease- baseline hgb around 10.5 Continue to monitor CBC Follow-up on anemia panel am.    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   VTE ppx: enoxaparin  (LOVENOX ) injection 40 mg Start:  06/22/23 1800       Subjective:  Patient seen and examined this morning Denies worsening abdominal pain nausea vomiting  Physical Exam:  General: awake,  alert, NAD. Malnourished, elderly male HEENT: atraumatic, clear conjunctiva, anicteric sclera, MMM, hearing grossly normal. Hoarse voice Respiratory: normal respiratory effort. Cardiovascular: quick capillary refill, normal S1/S2, RRR, no JVD, murmurs Gastrointestinal: soft, NT, ND Nervous: A&O x3. All limbs moving spontaneously Extremities: moves all equally, no edema, low muscle tone Skin: see clinical images for details of wounds on hip/sacrum, heels Psychiatry: normal mood, congruent affect   Data Reviewed:      Latest Ref Rng & Units 06/28/2023    4:41 AM 06/27/2023    5:48 AM 06/25/2023   11:40 PM  BMP  Glucose 70 - 99 mg/dL 409  811  71   BUN 8 - 23 mg/dL 28  16  15    Creatinine 0.61 - 1.24 mg/dL 9.14  7.82  9.56   Sodium 135 - 145 mmol/L 138  138  140   Potassium 3.5 - 5.1 mmol/L 3.7  3.6  3.7   Chloride 98 - 111 mmol/L 110  110  114   CO2 22 - 32 mmol/L 22  22  22    Calcium  8.9 - 10.3 mg/dL 8.5  8.8  8.5      Vitals:   06/27/23 2021 06/28/23 0352 06/28/23 0834 06/28/23 1553  BP: (!) 152/140 108/62 (!) 102/37 (!) 86/68  Pulse: 91 92 91 84  Resp: 16 17 18 18   Temp: 98.3 F (36.8 C) 99.8 F (37.7 C) 98.3 F (36.8 C) 97.8 F (36.6 C)  TempSrc: Oral Oral Oral Oral  SpO2: 100% 100% 100% 100%  Weight:      Height:          Latest Ref Rng & Units 06/28/2023    4:41 AM 06/27/2023    5:48 AM 06/25/2023   11:40 PM  CBC  WBC 4.0 - 10.5 K/uL 9.8  10.0  7.8   Hemoglobin 13.0 - 17.0 g/dL 8.8  9.7  9.2   Hematocrit 39.0 - 52.0 % 26.2  29.8  28.3   Platelets 150 - 400 K/uL 320  342  334      Author: Ezzard Holms, MD 06/28/2023 5:18 PM  For on call review [email or web address].

## 2023-06-28 NOTE — Plan of Care (Signed)

## 2023-06-29 DIAGNOSIS — E43 Unspecified severe protein-calorie malnutrition: Secondary | ICD-10-CM | POA: Diagnosis not present

## 2023-06-29 LAB — CBC WITH DIFFERENTIAL/PLATELET
Abs Immature Granulocytes: 0.08 10*3/uL — ABNORMAL HIGH (ref 0.00–0.07)
Basophils Absolute: 0.1 10*3/uL (ref 0.0–0.1)
Basophils Relative: 1 %
Eosinophils Absolute: 0.5 10*3/uL (ref 0.0–0.5)
Eosinophils Relative: 5 %
HCT: 27.7 % — ABNORMAL LOW (ref 39.0–52.0)
Hemoglobin: 9 g/dL — ABNORMAL LOW (ref 13.0–17.0)
Immature Granulocytes: 1 %
Lymphocytes Relative: 15 %
Lymphs Abs: 1.3 10*3/uL (ref 0.7–4.0)
MCH: 30.5 pg (ref 26.0–34.0)
MCHC: 32.5 g/dL (ref 30.0–36.0)
MCV: 93.9 fL (ref 80.0–100.0)
Monocytes Absolute: 0.5 10*3/uL (ref 0.1–1.0)
Monocytes Relative: 6 %
Neutro Abs: 6.3 10*3/uL (ref 1.7–7.7)
Neutrophils Relative %: 72 %
Platelets: 340 10*3/uL (ref 150–400)
RBC: 2.95 MIL/uL — ABNORMAL LOW (ref 4.22–5.81)
RDW: 14.6 % (ref 11.5–15.5)
WBC: 8.7 10*3/uL (ref 4.0–10.5)
nRBC: 0 % (ref 0.0–0.2)

## 2023-06-29 LAB — GLUCOSE, CAPILLARY
Glucose-Capillary: 295 mg/dL — ABNORMAL HIGH (ref 70–99)
Glucose-Capillary: 160 mg/dL — ABNORMAL HIGH (ref 70–99)
Glucose-Capillary: 195 mg/dL — ABNORMAL HIGH (ref 70–99)
Glucose-Capillary: 238 mg/dL — ABNORMAL HIGH (ref 70–99)

## 2023-06-29 LAB — PHOSPHORUS: Phosphorus: 2 mg/dL — ABNORMAL LOW (ref 2.5–4.6)

## 2023-06-29 LAB — BASIC METABOLIC PANEL WITH GFR
Anion gap: 6 (ref 5–15)
BUN: 18 mg/dL (ref 8–23)
CO2: 26 mmol/L (ref 22–32)
Calcium: 8.4 mg/dL — ABNORMAL LOW (ref 8.9–10.3)
Chloride: 108 mmol/L (ref 98–111)
Creatinine, Ser: 0.59 mg/dL — ABNORMAL LOW (ref 0.61–1.24)
GFR, Estimated: >60 mL/min (ref >60)
Glucose, Bld: 296 mg/dL — ABNORMAL HIGH (ref 70–99)
Potassium: 3.7 mmol/L (ref 3.5–5.1)
Sodium: 140 mmol/L (ref 135–145)

## 2023-06-29 LAB — MAGNESIUM: Magnesium: 1.6 mg/dL — ABNORMAL LOW (ref 1.7–2.4)

## 2023-06-29 MED ORDER — ORAL CARE MOUTH RINSE: 15.0000 mL | OROMUCOSAL | Status: DC | PRN

## 2023-06-29 MED ORDER — SALINE SPRAY 0.65 % NA SOLN
1.0000 | NASAL | Status: DC | PRN
  Administered 2023-06-29: 1 via NASAL
  Filled 2023-06-29: qty 44

## 2023-06-29 MED ORDER — MAGNESIUM SULFATE 2 GM/50ML IV SOLN
2.0000 g | Freq: Once | INTRAVENOUS | Status: AC
  Administered 2023-06-29: 2 g via INTRAVENOUS
  Filled 2023-06-29: qty 50

## 2023-06-29 MED ORDER — POTASSIUM PHOSPHATES 15 MMOLE/5ML IV SOLN
30.0000 mmol | Freq: Once | INTRAVENOUS | Status: AC
  Administered 2023-06-29: 30 mmol via INTRAVENOUS
  Filled 2023-06-29: qty 10

## 2023-06-29 NOTE — Progress Notes (Addendum)
 Physical Therapy Treatment Patient Details Name: Billy Franco MRN: [id number] DOB: 12/12/1940 Today's Date: 06/29/2023   History of Present Illness Pt is an 83 y.o. male presenting to hospital with N/V and dehydration; pt also with non-healing sacral wound.  Recent hospitalization s/p fall and s/p C3-5 ACDF 05/11/23 (pt with central cord syndrome).  Pt admitted with infected stage IV sacral ulcer, chronic osteomyelitis, worsening dysphagia, intractable N/V, unintentional weight loss, FTT, and chronic urinary retention.  PMH includes DM, CKD, htn, HLD, CHF, CAD, a-fib, chronic back pain, C3-5 ACDF.    PT Comments  Pt tolerated treatment well today with pre-medication for c/o sacral and BLE pain. Today, he is able to improve overall sitting balance at EOB, supporting self with UUE and able to correct anterior LOB with appropriate head and trunk righting strategies. Decreased sitting balance noted with fatigue, requiring increased assist and cueing for correction. Due to BLE weakness and spasticity (especially of the hamstrings and adductors), he continues to require increased assist (total) to stand from EOB while OT assisted with pericare, demonstrating narrow BOS and decreased TKE.  Pt able to demonstrate improvements in BLE tone and knee PROM with PNF hold relax stretching and deep pressure to hamstring for tone management in both sidelying and short sitting positions. Pt also demonstrates ability to perform LAQ in both sidelying and short sitting position. Pt will continue to benefit from skilled acute PT services to address deficits for return to baseline function.    If plan is discharge home, recommend the following: Two people to help with walking and/or transfers;Two people to help with bathing/dressing/bathroom;Assistance with cooking/housework;Assistance with feeding;Assist for transportation;Help with stairs or ramp for entrance;Supervision due to cognitive status   Can travel by private  vehicle     No  Equipment Recommendations  Other (comment)    Recommendations for Other Services Rehab consult     Precautions / Restrictions Precautions Precautions: Fall;Cervical Precaution Booklet Issued: No Recall of Precautions/Restrictions: Impaired Precaution/Restrictions Comments: Hard collar when OOB (apply in sitting) Required Braces or Orthoses: Cervical Brace Cervical Brace: Hard collar;Other (comment) Restrictions Weight Bearing Restrictions Per Provider Order: No Other Position/Activity Restrictions: PEG tube, sacral wound     Mobility  Bed Mobility Overal bed mobility: Needs Assistance Bed Mobility: Sidelying to Sit, Sit to Sidelying   Sidelying to sit: Max assist, +2 for physical assistance, HOB elevated     Sit to sidelying: Max assist, +2 for physical assistance, HOB elevated General bed mobility comments: max assist +2 for supine<>sidelying transfer with HOB semi-elevated; increased multimodal cueing for safety, sequencing, and hand placement.    Transfers Overall transfer level: Needs assistance   Transfers: Sit to/from Stand Sit to Stand: Total assist           General transfer comment: Total assist +1 to stand from EOB with bilateral knee blocking; able to maintain about 10-15sec while OT assisted with pericare. Demonstrates narrow BOS. Able to perform lateral scooting towards HOB with max assist +2 and widening of BOS via PT.    Ambulation/Gait               General Gait Details: unable at this time     Balance Overall balance assessment: Needs assistance Sitting-balance support: Single extremity supported, Feet supported Sitting balance-Leahy Scale: Fair Sitting balance - Comments: initially requires mod assist for static seated balance, but is able to progress to SBA/CGA level with UUE support. Able to correct forward LOB with head movement for trunk righting with cues. Postural  control: Posterior lean, Left lateral  lean Standing balance support: Bilateral upper extremity supported Standing balance-Leahy Scale: Zero Standing balance comment: total assist for standing with decreased bilateral knee flexion; bilateral knee blocking.                            Communication Communication Communication: No apparent difficulties Factors Affecting Communication: Reduced clarity of speech  Cognition Arousal: Alert Behavior During Therapy: WFL for tasks assessed/performed                             Following commands: Impaired Following commands impaired: Follows one step commands with increased time    Cueing Cueing Techniques: Verbal cues  Exercises Other Exercises Other Exercises: Performed BLE hamstring stretching in sidelying and short sitting with different techniques including: hold relax PNF stretch and deep pressure over hamstring for tone management. Pt able to assist with knee extension performing LAQ both in gravity eliminated and gravity dependent positions. Spouse educated regarding deep pressure to muscle belly to facilitate greater ROM/stretching.    General Comments General comments (skin integrity, edema, etc.): Assisted pt with donning bilateral ted hose in supine; assisted with donning of hard c-collar seated EOB, doffed in sidelying      Pertinent Vitals/Pain Pain Assessment Pain Assessment: Faces Faces Pain Scale: Hurts little more Pain Location: back and BLE pain Pain Descriptors / Indicators: Discomfort Pain Intervention(s): Monitored during session, Premedicated before session, Repositioned     PT Goals (current goals can now be found in the care plan section) Acute Rehab PT Goals Patient Stated Goal: to improve functional mobility PT Goal Formulation: With patient Time For Goal Achievement: 07/07/23 Potential to Achieve Goals: Fair Progress towards PT goals: Progressing toward goals    Frequency    Min 2X/week           Co-evaluation    Reason for Co-Treatment: Complexity of the patient's impairments (multi-system involvement);For patient/therapist safety;To address functional/ADL transfers PT goals addressed during session: Mobility/safety with mobility;Balance;Strengthening/ROM OT goals addressed during session: ADL's and self-care      AM-PAC PT "6 Clicks" Mobility   Outcome Measure  Help needed turning from your back to your side while in a flat bed without using bedrails?: A Lot Help needed moving from lying on your back to sitting on the side of a flat bed without using bedrails?: A Lot Help needed moving to and from a bed to a chair (including a wheelchair)?: Total Help needed standing up from a chair using your arms (e.g., wheelchair or bedside chair)?: Total Help needed to walk in hospital room?: Total Help needed climbing 3-5 steps with a railing? : Total 6 Click Score: 8    End of Session Equipment Utilized During Treatment: Cervical collar;Gait belt Activity Tolerance: Patient limited by fatigue;Patient limited by pain Patient left: in bed;with call bell/phone within reach;with bed alarm set;with family/visitor present;Other (comment) Nurse Communication: Mobility status;Precautions PT Visit Diagnosis: Other abnormalities of gait and mobility (R26.89);History of falling (Z91.81);Other symptoms and signs involving the nervous system (R29.898);Pain Pain - part of body:  (sacral wound pain)     Time: 1610-9604 PT Time Calculation (min) (ACUTE ONLY): 33 min  Charges:    $Neuromuscular Re-education: 23-37 mins PT General Charges $$ ACUTE PT VISIT: 1 Visit                      Abraham Hoffmann E.  Willean Harness, PT, DPT 3:01 PM,06/29/23 Physical Therapist - [facility]

## 2023-06-29 NOTE — Progress Notes (Signed)
 INFECTIOUS DISEASE PROGRESS NOTE Date of Admission:  06/22/2023     ID: Billy Franco is a 83 y.o. male with  decub ulcer Principal Problem:   Pressure ulcer Active Problems:   Stage IV pressure ulcer of sacral region (HCC)   Pressure injury of skin with infection   Protein-calorie malnutrition, severe   Sepsis (HCC)   Subjective: Peg placed. Picc in place   Assessment/Plan: Multiple pressure wounds Sacrum stage IV- Cx from wound  only growing enterococcus and corynebacterium  ESR > 140 PICC placed . Cont unasyn   At dc can use just unasyn  or zosyn  (depending on preference of the facility he goes to).   The most important aspect of wound healing is off loading, air mattress, topical care, improve nutrition and prevent soiling with stool. Seen by surgical team - no surgical intervention   Cervical myelopathy and central cord syndrome with Acute neurological deficit in April following a fall necessitating ACDF C3-C5    Billy Franco   06/29/2023, 3:39 PM

## 2023-06-29 NOTE — Progress Notes (Signed)
 Occupational Therapy Treatment Patient Details Name: Billy Franco MRN: [id number] DOB: Mar 06, 1940 Today's Date: 06/29/2023   History of present illness Pt is an 83 y.o. male presenting to hospital with N/V and dehydration; pt also with non-healing sacral wound.  Recent hospitalization s/p fall and s/p C3-5 ACDF 05/11/23 (pt with central cord syndrome).  Pt admitted with infected stage IV sacral ulcer, chronic osteomyelitis, worsening dysphagia, intractable N/V, unintentional weight loss, FTT, and chronic urinary retention.  PMH includes DM, CKD, htn, HLD, CHF, CAD, a-fib, chronic back pain, C3-5 ACDF.   OT comments  Upon entering the room, pt supine in bed and agreeable to OT intervention. Pt seen for skilled co-treatment with PT with wife present in room. Focus on functional mobility and safety. Cervical brace donned once seated on EOB. Pt performing supine >sit with max A of 2.Pt able to maintain static sitting balance with mod- min guard for static sitting balance. Total A to don B thigh high TEDs for BP management. Pt stands with B knee's blocked and noted to have had BM. Pt standing with total A of 1 and second person provides total hygiene. Lateral scoots with max A of 2 to the L to reposition. Call bell and all needed items within reach as therapist exits the room.       If plan is discharge home, recommend the following:  Two people to help with walking and/or transfers;A lot of help with bathing/dressing/bathroom;Direct supervision/assist for medications management;Assist for transportation;Assistance with cooking/housework;Help with stairs or ramp for entrance;Assistance with feeding   Equipment Recommendations  Other (comment) (defer to next venue of care)       Precautions / Restrictions Precautions Precautions: Fall;Cervical Required Braces or Orthoses: Cervical Brace Cervical Brace: Hard collar;Other (comment) Restrictions Other Position/Activity Restrictions: PEG tube, sacral  wound       Mobility Bed Mobility Overal bed mobility: Needs Assistance Bed Mobility: Sidelying to Sit, Sit to Sidelying   Sidelying to sit: Max assist, +2 for physical assistance, HOB elevated     Sit to sidelying: Max assist, +2 for physical assistance, HOB elevated General bed mobility comments: max assist +2 for supine<>sidelying transfer with HOB semi-elevated; increased multimodal cueing for safety, sequencing, and hand placement.    Transfers Overall transfer level: Needs assistance Equipment used: 2 person hand held assist Transfers: Sit to/from Stand Sit to Stand: Total assist, +2 physical assistance           General transfer comment: Total assist +1 to stand from EOB with bilateral knee blocking; able to maintain about 10-15sec while OT assisted with pericare. Demonstrates narrow BOS. Able to perform lateral scooting towards HOB with max assist +2 and widening of BOS via PT.     Balance Overall balance assessment: Needs assistance Sitting-balance support: Single extremity supported, Feet supported Sitting balance-Leahy Scale: Fair   Postural control: Posterior lean, Left lateral lean Standing balance support: Bilateral upper extremity supported Standing balance-Leahy Scale: Zero                             ADL either performed or assessed with clinical judgement   ADL Overall ADL's : Needs assistance/impaired                                       General ADL Comments: Pt with loose BM discovered when standing needing total A of  1 to stand and assist of other person for hygiene needs    Extremity/Trunk Assessment              Vision Patient Visual Report: No change from baseline           Communication Communication Communication: Impaired Factors Affecting Communication: Reduced clarity of speech   Cognition Arousal: Alert Behavior During Therapy: WFL for tasks assessed/performed Cognition: Cognition impaired      Awareness: Online awareness impaired, Intellectual awareness intact                         Following commands: Impaired Following commands impaired: Follows one step commands with increased time      Cueing   Cueing Techniques: Verbal cues        General Comments Assisted pt with donning bilateral ted hose in supine; assisted with donning of hard c-collar seated EOB, doffed in sidelying    Pertinent Vitals/ Pain       Pain Assessment Pain Assessment: Faces Faces Pain Scale: Hurts little more Pain Location: back and BLE pain Pain Descriptors / Indicators: Discomfort, Aching Pain Intervention(s): Limited activity within patient's tolerance, Monitored during session, Premedicated before session, Repositioned         Frequency  Min 2X/week        Progress Toward Goals  OT Goals(current goals can now be found in the care plan section)  Progress towards OT goals: Progressing toward goals         Co-evaluation    PT/OT/SLP Co-Evaluation/Treatment: Yes Reason for Co-Treatment: Complexity of the patient's impairments (multi-system involvement);For patient/therapist safety;To address functional/ADL transfers PT goals addressed during session: Mobility/safety with mobility;Balance;Strengthening/ROM OT goals addressed during session: ADL's and self-care      AM-PAC OT "6 Clicks" Daily Activity     Outcome Measure   Help from another person eating meals?: Total Help from another person taking care of personal grooming?: Total Help from another person toileting, which includes using toliet, bedpan, or urinal?: Total Help from another person bathing (including washing, rinsing, drying)?: Total Help from another person to put on and taking off regular upper body clothing?: Total Help from another person to put on and taking off regular lower body clothing?: Total 6 Click Score: 6    End of Session Equipment Utilized During Treatment: Gait belt;Oxygen  OT  Visit Diagnosis: Other abnormalities of gait and mobility (R26.89);Hemiplegia and hemiparesis;Feeding difficulties (R63.3)   Activity Tolerance Patient tolerated treatment well   Patient Left in bed;with call bell/phone within reach;with family/visitor present   Nurse Communication Mobility status        Time: 1343-1410 OT Time Calculation (min): 27 min  Charges: OT General Charges $OT Visit: 1 Visit OT Treatments $Self Care/Home Management : 8-22 mins  George Kinder, MS, OTR/L , CBIS ascom [phone]  06/29/23, 3:37 PM

## 2023-06-29 NOTE — TOC Progression Note (Signed)
 Transition of Care [facility]) - Progression Note    Patient Details  Name: Billy Franco MRN: [id number] Date of Birth: [birth year]  [facility] CM/SW Contact  Loman Risk, RN Phone Number: 06/29/2023, 2:26 PM  Clinical Narrative:      Per ID pharmacist it has not yet been determined what antibiotics will be required at discharge  Bed search initiated  to see if there are any other options then for patient to return to [facility]        Expected Discharge Plan and Services                                               Social Determinants of Health (SDOH) Interventions SDOH Screenings   Food Insecurity: No Food Insecurity (06/22/2023)  Housing: Low Risk  (06/22/2023)  Transportation Needs: No Transportation Needs (06/22/2023)  Utilities: Not At Risk (06/22/2023)  Alcohol Screen: Low Risk  (01/24/2019)  Depression (PHQ2-9): Low Risk  (02/02/2023)  Financial Resource Strain: Low Risk  (01/24/2019)  Physical Activity: Sufficiently Active (01/24/2019)  Social Connections: Socially Integrated (06/22/2023)  Stress: No Stress Concern Present (01/24/2019)  Tobacco Use: Low Risk  (06/22/2023)    Readmission Risk Interventions     No data to display

## 2023-06-29 NOTE — Progress Notes (Signed)
 Nutrition Follow Up Note   DOCUMENTATION CODES:   Severe malnutrition in context of acute illness/injury  INTERVENTION:   Continue Osmolite 1.5@65ml /hr   Free water  flushes 50ml q4 hours to maintain tube patency   Regimen provides 2340kcal/day, 98g/day protein and 1474ml/day of free water .   Pt remains at high refeed risk; recommend monitor potassium, magnesium  and phosphorus labs daily until stable  Juven Fruit Punch BID via tube, each serving provides 95kcal and 2.5g of protein (amino acids glutamine and arginine)  Vitamin C  500mg  BID via tube  Daily weights   NUTRITION DIAGNOSIS:   Severe Malnutrition related to acute illness as evidenced by severe fat depletion, severe muscle depletion, 11 percent weight loss in 5 months. -ongoing   GOAL:   Patient will meet greater than or equal to 90% of their needs -met   MONITOR:   Labs, Weight trends, I & O's, Skin, Diet advancement, TF tolerance  ASSESSMENT:   83 y/o male with h/o GERD, chronic back pain, DM, HTN, Afib, RLS, CAD s/p PCI and stent, CKD III, CHF with EF 25-30%, refused AICD, ischemic cardiomyopathy, gout and recent admission for cervical stenosis and myelopathy after fall s/p C3-5 ACDF 4/21 complicated by acute metabolic encephalopathy with acute hypoxic respiratory failure due to presumed aspiration requiring intubation and mechanical ventilation for airway protection and who is now admitted for FTT (with dysphagia, nausea and vomiting) and osteomyeltitis sacral wound.  Met with pt and pt's wife in room today. Pt is more alert today and reports that he is feeling better. Pt reports that he worked with PT today and was able to stand. Pt is tolerating tube feeds at goal rate. Pt does report several episodes of diarrhea today. Discussed with patient that some diarrhea is to be expected with a new G-tube but if patient continues to have diarrhea, we may need to change formulas. Pt continues to refeed; electrolytes being  monitored and supplemented. Pt remains NPO; spoke with SLP who plans to follow up with patient tomorrow. Per chart, pt appears to be up ~5lbs since admission. Plan is for SNF at discharge.   Medications reviewed and include: vitamin C , lovenox , insulin , juven, unasyn , Kphos   Labs reviewed: K 3.7 wnl, creat 0.59(L), P 2.0(L), Mg 1.6(L) Hgb 9.0(L), Hct 27.7(L) Cbgs- 195, 160, 295 x 24 hrs   Diet Order:   Diet Order             Diet NPO time specified Except for: Sips with Meds  Diet effective midnight                  EDUCATION NEEDS:   Education needs have been addressed  Skin:  Skin Assessment: Reviewed RN Assessment  -Sacrum with chronic Stage 4 pressure injury -Left buttock wound is Unstageable -Left hip is Unstageable -Left heel Unstageable  -Right heel Unstageable   Last BM:  6/9- type 6  Height:   Ht Readings from Last 1 Encounters:  06/26/23 5\' 7"  (1.702 m)    Weight:   Wt Readings from Last 1 Encounters:  06/26/23 63 kg    Ideal Body Weight:  67.2 kg  BMI:  Body mass index is 21.75 kg/m.  Estimated Nutritional Needs:   Kcal:  1900-2200kcal/day  Protein:  95-110g/day  Fluid:  1.6-1.8L/day  Torrance Freestone MS, RD, LDN If unable to be reached, please send secure chat to "RD inpatient" available from 8:00a-4:00p daily

## 2023-06-29 NOTE — Inpatient Diabetes Management (Addendum)
 Inpatient Diabetes Program Recommendations  AACE/ADA: New Consensus Statement on Inpatient Glycemic Control (2015)  Target Ranges:  Prepandial:   less than 140 mg/dL      Peak postprandial:   less than 180 mg/dL (1-2 hours)      Critically ill patients:  140 - 180 mg/dL   Lab Results  Component Value Date   GLUCAP 295 (H) 06/29/2023   HGBA1C 7.8 (A) 02/02/2023    Review of Glycemic Control  Latest Reference Range & Units 06/27/23 06:40 06/27/23 11:38 06/27/23 16:40 06/27/23 20:17 06/28/23 08:28 06/28/23 11:35 06/28/23 15:56 06/29/23 07:53  Glucose-Capillary 70 - 99 mg/dL 161 (H) 096 (H) 045 (H) 187 (H) 246 (H) 117 (H) 117 (H) 295 (H)  (H): Data is abnormally high  Diabetes history: DM2 Outpatient Diabetes medications: Glipizide  2.5 mg QAM, Metformin  1500 mg QAM  Current orders for Inpatient glycemic control: novolog  0-15 units TID, Osmolite @ 65 ml/hr  Inpatient Diabetes Program Recommendations:    Might consider:  Novolog  0-9 units Q4H with Osmolite @ 65 ml/hr.   Thank you, Hays Lipschutz, MSN, CDCES Diabetes Coordinator Inpatient Diabetes Program [phone] (team pager from 8a-5p)
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
## 2023-06-29 NOTE — Progress Notes (Signed)
 Progress Note   Patient: Billy Franco UJW:[id number] DOB: 08-03-1940 DOA: 06/22/2023     7 DOS: the patient was seen and examined on 06/29/2023      Brief hospital course: Billy Franco is a 83 y.o. 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 Upon SLP, barium swallow study- patient has severe pharyngeal musculature weakness and high risk of aspiration so made NPO and will be pursuing PEG- has ultimately been very malnourished and had significant weight loss which impacts wound healing and ability to work with PT to increase mobility.  Also reports orthostatic hypotension impacting PT participation and was inexplicably on multiple antihypertensives and midodrine simultaneously. Holding all meds he is on borderline lower limits of normal BP and was able to participate with PT.    ED Course: Afebrile, nontachycardic nonhypotensive nonhypoxic.  CT abdominal pelvis showed no acute findings seen stomach.  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  Blood work showed BUN 13 creatinine 0.9.   Initial treatment included vancomycin  and cefepime .   Assessment and Plan:    Stage IV sacral ulcer  sacral osteomyelitis- refer to clinical images and WOC note description on 6/3 for further details.    - wound care as recommended by [facility] team - RD consulted as patient is significantly malnourished.  Continue current antibiotics Infectious disease on board PT OT has recommended acute rehab placement   Severe  malnutrition Dietitian on board   Neurogenic Dysphagia, worsening- recently discharged on full diet and at SNF declined to tolerating pureed diet only. Still having aspirations. SLP evaluated with barium swallow 6/3 and has SEVERE pharyngeal phase dysphagia. Severely high risk of aspiration. Made him NPO immediately and started on IV fluids while awaiting evaluation for PEG placement. Discussed this plan with patient and wife who are agreeable to this plan. His dysphagia is not likely to be reversible in short term or ever since I suspect related to innervation dysfunction from recent cervical spine compression and neurosurgery procedure sequelae.  - I would continue to allow ice chips to continue to promote use of swallow musculature but otherwise NPO to avoid aspiration.  - neurosurgery consulted to evaluate and reviewed swallow study Patient underwent PEG tube placement on 06/26/2023 Continue tube feeding Continue to monitor electrolytes to prevent refeeding syndrome - Speech therapy and dietitian on board   Unintentional weight loss  Failure to thrive- in setting of malnutrition, dysphagia - treat as above - PT/OT, RD   Orthostatic hypotension-  Continue PT OT Monitor orthostatic blood pressure closely   Anemia of chronic disease- baseline hgb around 10.5 Continue to monitor CBC Follow-up on anemia panel am.    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  VTE ppx: enoxaparin  (LOVENOX ) injection 40 mg Start: 06/22/23 1800       Subjective:  Patient seen and examined this morning Patient did not have any  acute overnight event   Physical Exam:  General: awake, alert, NAD. Malnourished, elderly male HEENT: atraumatic, clear conjunctiva, anicteric sclera, MMM, hearing grossly normal. Hoarse voice Respiratory: normal respiratory effort. Cardiovascular: quick capillary refill, normal S1/S2, RRR, no JVD, murmurs Gastrointestinal: soft, NT, ND Nervous: A&O x3. All limbs moving spontaneously Extremities: moves all equally, no edema, low muscle tone Skin: see clinical images for details of wounds on hip/sacrum, heels Psychiatry: normal mood, congruent affect   Data Reviewed:      Latest Ref Rng & Units 06/29/2023    4:19 AM 06/28/2023    4:41 AM 06/27/2023    5:48 AM  CBC  WBC 4.0 - 10.5 K/uL 8.7  9.8  10.0   Hemoglobin 13.0 - 17.0 g/dL 9.0  8.8  9.7   Hematocrit 39.0 - 52.0 % 27.7  26.2  29.8   Platelets 150 - 400 K/uL 340  320  342        Latest Ref Rng & Units 06/29/2023    4:19 AM 06/28/2023    4:41 AM 06/27/2023    5:48 AM  BMP  Glucose 70 - 99 mg/dL 161  096  045   BUN 8 - 23 mg/dL 18  28  16    Creatinine 0.61 - 1.24 mg/dL 4.09  8.11  9.14   Sodium 135 - 145 mmol/L 140  138  138   Potassium 3.5 - 5.1 mmol/L 3.7  3.7  3.6   Chloride 98 - 111 mmol/L 108  110  110   CO2 22 - 32 mmol/L 26  22  22    Calcium  8.9 - 10.3 mg/dL 8.4  8.5  8.8      Vitals:   06/28/23 1553 06/28/23 2204 06/29/23 0438 06/29/23 1526  BP: (!) 86/68 113/65 (!) 108/55 (!) 99/59  Pulse: 84 85 97 (!) 59  Resp: 18 20 18 18   Temp: 97.8 F (36.6 C) 97.9 F (36.6 C) 98.1 F (36.7 C) 98 F (36.7 C)  TempSrc: Oral Oral Oral Oral  SpO2: 100% 100% 100% 93%  Weight:      Height:       Author: Ezzard Holms, MD 06/29/2023 4:38 PM  For on call review [email or web address].

## 2023-06-30 DIAGNOSIS — E43 Unspecified severe protein-calorie malnutrition: Secondary | ICD-10-CM | POA: Diagnosis not present

## 2023-06-30 LAB — BASIC METABOLIC PANEL WITH GFR
Anion gap: 11 (ref 5–15)
BUN: 16 mg/dL (ref 8–23)
CO2: 24 mmol/L (ref 22–32)
Calcium: 8.7 mg/dL — ABNORMAL LOW (ref 8.9–10.3)
Chloride: 105 mmol/L (ref 98–111)
Creatinine, Ser: 0.76 mg/dL (ref 0.61–1.24)
GFR, Estimated: >60 mL/min (ref >60)
Glucose, Bld: 310 mg/dL — ABNORMAL HIGH (ref 70–99)
Potassium: 6 mmol/L — ABNORMAL HIGH (ref 3.5–5.1)
Sodium: 140 mmol/L (ref 135–145)

## 2023-06-30 LAB — CBC WITH DIFFERENTIAL/PLATELET
Abs Immature Granulocytes: 0.1 10*3/uL — ABNORMAL HIGH (ref 0.00–0.07)
Basophils Absolute: 0.1 10*3/uL (ref 0.0–0.1)
Basophils Relative: 1 %
Eosinophils Absolute: 0.3 10*3/uL (ref 0.0–0.5)
Eosinophils Relative: 3 %
HCT: 32 % — ABNORMAL LOW (ref 39.0–52.0)
Hemoglobin: 10.3 g/dL — ABNORMAL LOW (ref 13.0–17.0)
Immature Granulocytes: 1 %
Lymphocytes Relative: 11 %
Lymphs Abs: 1.2 10*3/uL (ref 0.7–4.0)
MCH: 30.4 pg (ref 26.0–34.0)
MCHC: 32.2 g/dL (ref 30.0–36.0)
MCV: 94.4 fL (ref 80.0–100.0)
Monocytes Absolute: 0.7 10*3/uL (ref 0.1–1.0)
Monocytes Relative: 7 %
Neutro Abs: 8.1 10*3/uL — ABNORMAL HIGH (ref 1.7–7.7)
Neutrophils Relative %: 77 %
Platelets: 378 10*3/uL (ref 150–400)
RBC: 3.39 MIL/uL — ABNORMAL LOW (ref 4.22–5.81)
RDW: 15 % (ref 11.5–15.5)
WBC: 10.4 10*3/uL (ref 4.0–10.5)
nRBC: 0 % (ref 0.0–0.2)

## 2023-06-30 LAB — GLUCOSE, CAPILLARY
Glucose-Capillary: 122 mg/dL — ABNORMAL HIGH (ref 70–99)
Glucose-Capillary: 196 mg/dL — ABNORMAL HIGH (ref 70–99)
Glucose-Capillary: 224 mg/dL — ABNORMAL HIGH (ref 70–99)
Glucose-Capillary: 229 mg/dL — ABNORMAL HIGH (ref 70–99)
Glucose-Capillary: 268 mg/dL — ABNORMAL HIGH (ref 70–99)
Glucose-Capillary: 348 mg/dL — ABNORMAL HIGH (ref 70–99)

## 2023-06-30 LAB — PHOSPHORUS: Phosphorus: 2.7 mg/dL (ref 2.5–4.6)

## 2023-06-30 LAB — MAGNESIUM: Magnesium: 2 mg/dL (ref 1.7–2.4)

## 2023-06-30 LAB — POTASSIUM: Potassium: 4.6 mmol/L (ref 3.5–5.1)

## 2023-06-30 MED ORDER — ASPIRIN 81 MG PO CHEW
81.0000 mg | CHEWABLE_TABLET | Freq: Every day | ORAL | Status: DC
  Administered 2023-06-30 – 2023-07-04 (×5): 81 mg
  Filled 2023-06-30 (×5): qty 1

## 2023-06-30 MED ORDER — INSULIN ASPART 100 UNIT/ML IV SOLN
10.0000 [IU] | Freq: Once | INTRAVENOUS | Status: AC
  Administered 2023-06-30: 10 [IU] via INTRAVENOUS
  Filled 2023-06-30: qty 0.1

## 2023-06-30 MED ORDER — SODIUM CHLORIDE 0.9 % IV SOLN: INTRAVENOUS | Status: AC

## 2023-06-30 MED ORDER — DEXTROSE 50 % IV SOLN: 50.0000 mL | Freq: Once | INTRAVENOUS | Status: DC

## 2023-06-30 MED ORDER — CALCIUM GLUCONATE-NACL 1-0.675 GM/50ML-% IV SOLN
1.0000 g | Freq: Once | INTRAVENOUS | Status: AC
  Administered 2023-06-30: 1000 mg via INTRAVENOUS
  Filled 2023-06-30: qty 50

## 2023-06-30 MED ORDER — MIDODRINE HCL 5 MG PO TABS
10.0000 mg | ORAL_TABLET | Freq: Three times a day (TID) | ORAL | Status: DC
  Administered 2023-07-01 – 2023-07-05 (×12): 10 mg via ORAL
  Filled 2023-06-30 (×11): qty 2

## 2023-06-30 NOTE — Progress Notes (Signed)
 Progress Note   Patient: Billy Franco ZOX:[id number] DOB: 22-Mar-1940 DOA: 06/22/2023     8 DOS: the patient was seen and examined on 06/30/2023     Brief hospital course: From HPI "Billy Franco is a 83 y.o. 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 Upon SLP, barium swallow study- patient has severe pharyngeal musculature weakness and high risk of aspiration so made NPO and will be pursuing PEG- has ultimately been very malnourished and had significant weight loss which impacts wound healing and ability to work with PT to increase mobility.  Also reports orthostatic hypotension impacting PT participation and was inexplicably on multiple antihypertensives and midodrine simultaneously. Holding all meds he is on borderline lower limits of normal BP and was able to participate with PT.    ED Course: Afebrile, nontachycardic nonhypotensive nonhypoxic.  CT abdominal pelvis showed no acute findings seen stomach.  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  Blood work showed BUN 13 creatinine 0.9.   Initial treatment included vancomycin  and cefepime .  "  Infectious disease on board and recommending prolonged duration of antibiotic therapy.   Assessment and Plan:    Stage IV sacral ulcer  sacral osteomyelitis- refer to clinical images and WOC note description on 6/3 for further details.  Continue wound care management as recommended by WOC team Continue current antibiotics Infectious disease on board and case  discussed ID recommends 6 weeks of antibiotics PT OT has recommended acute rehab placement TOC on board and working on placement when patient is medically stable   Severe malnutrition Dietitian on board Continue tube feeding via PEG tube   Orthostatic hypotension Patient was significantly orthostatic today while was working with physical therapy We will give a trial of IV fluid Midodrine added given hypotension  Neurogenic Dysphagia, worsening- recently discharged on full diet and at SNF declined to tolerating pureed diet only. Still having aspirations. SLP evaluated with barium swallow 6/3 and has SEVERE pharyngeal phase dysphagia. Severely high risk of aspiration.  Patient underwent PEG tube placement on 06/26/2023 Continue tube feeding Continue to monitor electrolytes to prevent refeeding syndrome - Speech therapy and dietitian on board   Hyperkalemia Patient received hyperkalemia cocktail Continue to monitor potassium closely  Unintentional weight loss  Failure to thrive- in setting of malnutrition, dysphagia - treat as above - PT/OT, RD   Anemia of chronic disease- baseline hgb around 10.5 Continue to monitor CBC Normal folate, B12 levels   IIDM - holding long acting while NPO - Continue SSI and CBG monitoring    Chronic urinary retention - No acute concern   CKD stage IIIb - Euvolemic, creatinine level stable   Chronic HFrEF - Euvolemic - Continue home BP/CHF medications   PAF - In sinus rhythm - Not on anticoagulation due to history of gross hematuria   Body mass index is 22.71 kg/m.   VTE ppx: enoxaparin  (LOVENOX ) injection 40 mg Start: 06/22/23 1800       Subjective:  Patient seen and examined this morning Patient did not have any acute overnight event Patient became orthostatic this morning when working with physical therapy  Physical Exam:  General: awake, alert, NAD. Malnourished, elderly male HEENT: atraumatic, clear conjunctiva, anicteric  sclera, MMM, hearing grossly normal. Hoarse voice Respiratory: normal respiratory effort. Cardiovascular: quick capillary refill, normal S1/S2, RRR, no JVD, murmurs Gastrointestinal: soft, NT, ND Nervous: A&O x3. All limbs moving spontaneously Extremities: moves all equally, no edema, low muscle tone Skin: see clinical images for details of wounds on hip/sacrum, heels Psychiatry: normal mood, congruent affect   Data Reviewed:    Latest Ref Rng & Units 06/30/2023    4:24 AM 06/29/2023    4:19 AM 06/28/2023    4:41 AM  CBC  WBC 4.0 - 10.5 K/uL 10.4  8.7  9.8   Hemoglobin 13.0 - 17.0 g/dL 95.2  9.0  8.8   Hematocrit 39.0 - 52.0 % 32.0  27.7  26.2   Platelets 150 - 400 K/uL 378  340  320        Latest Ref Rng & Units 06/30/2023    4:24 AM 06/29/2023    4:19 AM 06/28/2023    4:41 AM  BMP  Glucose 70 - 99 mg/dL 841  324  401   BUN 8 - 23 mg/dL 16  18  28    Creatinine 0.61 - 1.24 mg/dL 0.27  2.53  6.64   Sodium 135 - 145 mmol/L 140  140  138   Potassium 3.5 - 5.1 mmol/L 6.0  3.7  3.7   Chloride 98 - 111 mmol/L 105  108  110   CO2 22 - 32 mmol/L 24  26  22    Calcium  8.9 - 10.3 mg/dL 8.7  8.4  8.5     Vitals:   06/30/23 0500 06/30/23 0747 06/30/23 1511 06/30/23 1745  BP:  (!) 95/47 111/83 (!) 97/47  Pulse:  (!) 124 (!) 115 (!) 107  Resp:  16 16 16   Temp:  99.6 F (37.6 C)  98.5 F (36.9 C)  TempSrc:      SpO2:  97% 98% 97%  Weight: 66 kg     Height:         Disposition: Pending acute rehab when medically stable  Time spent: 42 minutes  Author: Ezzard Holms, MD 06/30/2023 5:48 PM  For on call review [email or web address].

## 2023-06-30 NOTE — Progress Notes (Signed)
 Physical Therapy Treatment Patient Details Name: Billy Franco MRN: [id number] DOB: Jun 21, 1940 Today's Date: 06/30/2023   History of Present Illness Pt is an 83 y.o. male presenting to hospital with N/V and dehydration; pt also with non-healing sacral wound.  Recent hospitalization s/p fall and s/p C3-5 ACDF 05/11/23 (pt with central cord syndrome).  Pt admitted with infected stage IV sacral ulcer, chronic osteomyelitis, worsening dysphagia, intractable N/V, unintentional weight loss, FTT, and chronic urinary retention.  PMH includes DM, CKD, htn, HLD, CHF, CAD, a-fib, chronic back pain, C3-5 ACDF.    PT Comments  Pt was supine in bed with HOB elevated 30 degrees and feeds running.feeds held throughout session but resumed post session. Supportive daughter present during session. Pt overall lethargic but awake throughout session (may be due to medications given earlier in the day prior to session). Noted soft BP throughout the AM. At rest BP 108/91(98), upon sitting up EOB (total) BP dropped to 99/48 (63) then down to 77/48(57) after 2 minutes. Total assisted pt to/from EOB. Highly recommend +2 assistance. Pt remained alert but overall responsiveness poor when sitting EOB. Sternal rubs to keep pt awake/alert. MD made aware of concerns. Poor progress made over past two admission. Per daughter, pt unable to get OOB at rehab due to BP concerns. Acute PT will continue to follow per current POC.     If plan is discharge home, recommend the following: Two people to help with walking and/or transfers;Two people to help with bathing/dressing/bathroom;Assistance with cooking/housework;Assistance with feeding;Assist for transportation;Help with stairs or ramp for entrance;Supervision due to cognitive status   Can travel by private vehicle     No  Equipment Recommendations  Other (comment) (Defer to next level of care)       Precautions / Restrictions Precautions Precautions: Fall;Cervical Precaution  Booklet Issued: No Precaution/Restrictions Comments: Hard collar when OOB (apply in sitting) Required Braces or Orthoses: Cervical Brace Cervical Brace: Hard collar Restrictions Weight Bearing Restrictions Per Provider Order: No Other Position/Activity Restrictions: PEG tube, sacral wound     Mobility  Bed Mobility Overal bed mobility: Needs Assistance Bed Mobility: Supine to Sit, Sit to Supine  Supine to sit: Total assist, HOB elevated Sit to supine: Total assist General bed mobility comments: pt required total assist to safely progress from supine to EOB sitting. Unable to tolerate sitting EOB. alertness altered and mental status changed while seated EOB. Supine 108/91(98), BP dropped 77/48(57) in sitting. Did not call rapid response but was close to.     Balance Overall balance assessment: Needs assistance     Sitting balance - Comments: pt required total assistance throughout sitting EOB to maintain sitting balance at EOB. Author questions if pt did so poorly today due to meds recieved    Communication Communication Communication: No apparent difficulties  Cognition Arousal: Lethargic, Suspect due to medications Behavior During Therapy: Flat affect   PT - Cognitive impairments: History of cognitive impairments    PT - Cognition Comments: Impaired awareness of deficits Following commands: Impaired Following commands impaired: Follows one step commands inconsistently    Cueing Cueing Techniques: Verbal cues, Tactile cues, Visual cues         Pertinent Vitals/Pain Pain Assessment Pain Assessment: PAINAD Breathing: noisy labored breathing, long periods of hyperventilation, Cheyne-Stokes respirations Negative Vocalization: occasional moan/groan, low speech, negative/disapproving quality Facial Expression: sad, frightened, frown Body Language: tense, distressed pacing, fidgeting Consolability: distracted or reassured by voice/touch PAINAD Score: 6 Pain Location: back and  BLE pain Pain Descriptors / Indicators: Discomfort,  Aching Pain Intervention(s): Limited activity within patient's tolerance, Monitored during session, Premedicated before session, Repositioned     PT Goals (current goals can now be found in the care plan section) Acute Rehab PT Goals Patient Stated Goal: none stated Progress towards PT goals: Not progressing toward goals - comment    Frequency    Min 2X/week       Co-evaluation     PT goals addressed during session: Mobility/safety with mobility;Balance;Proper use of DME;Strengthening/ROM        AM-PAC PT "6 Clicks" Mobility   Outcome Measure  Help needed turning from your back to your side while in a flat bed without using bedrails?: Total Help needed moving from lying on your back to sitting on the side of a flat bed without using bedrails?: Total Help needed moving to and from a bed to a chair (including a wheelchair)?: Total Help needed standing up from a chair using your arms (e.g., wheelchair or bedside chair)?: Total Help needed to walk in hospital room?: Total Help needed climbing 3-5 steps with a railing? : Total 6 Click Score: 6    End of Session   Activity Tolerance: Patient limited by fatigue;Patient limited by lethargy;Patient limited by pain Patient left: in bed;with call bell/phone within reach;with bed alarm set;with family/visitor present;Other (comment) (daughter present throughout sesison) Nurse Communication: Mobility status;Precautions PT Visit Diagnosis: Other abnormalities of gait and mobility (R26.89);History of falling (Z91.81);Other symptoms and signs involving the nervous system (R29.898);Pain     Time: 1914-7829 PT Time Calculation (min) (ACUTE ONLY): 12 min  Charges:    $Therapeutic Activity: 8-22 mins PT General Charges $$ ACUTE PT VISIT: 1 Visit                     Chester Costa PTA 06/30/23, 11:13 AM

## 2023-06-30 NOTE — Progress Notes (Addendum)
 INFECTIOUS DISEASE PROGRESS NOTE Date of Admission:  06/22/2023     ID: Billy Franco is a 83 y.o. male with  decub ulcer Principal Problem:   Pressure ulcer Active Problems:   Stage IV pressure ulcer of sacral region (HCC)   Pressure injury of skin with infection   Protein-calorie malnutrition, severe   Sepsis (HCC)   Subjective: No fevers Tolerating tube feeds. Picc placed  ROS  Eleven systems are reviewed and negative except per hpi  Medications:  Antibiotics Given (last 72 hours)     Date/Time Action Medication Dose Rate   06/27/23 1514 New Bag/Given   Ampicillin -Sulbactam (UNASYN ) 3 g in sodium chloride  0.9 % 100 mL IVPB 3 g 200 mL/hr   06/27/23 1632 New Bag/Given   vancomycin  (VANCOCIN ) IVPB 1000 mg/200 mL premix 1,000 mg 200 mL/hr   06/27/23 2332 New Bag/Given   Ampicillin -Sulbactam (UNASYN ) 3 g in sodium chloride  0.9 % 100 mL IVPB 3 g 200 mL/hr   06/28/23 0531 New Bag/Given   Ampicillin -Sulbactam (UNASYN ) 3 g in sodium chloride  0.9 % 100 mL IVPB 3 g 200 mL/hr   06/28/23 1237 New Bag/Given   Ampicillin -Sulbactam (UNASYN ) 3 g in sodium chloride  0.9 % 100 mL IVPB 3 g 200 mL/hr   06/28/23 1709 New Bag/Given   Ampicillin -Sulbactam (UNASYN ) 3 g in sodium chloride  0.9 % 100 mL IVPB 3 g 200 mL/hr   06/29/23 0029 New Bag/Given   Ampicillin -Sulbactam (UNASYN ) 3 g in sodium chloride  0.9 % 100 mL IVPB 3 g 200 mL/hr   06/29/23 0610 New Bag/Given   Ampicillin -Sulbactam (UNASYN ) 3 g in sodium chloride  0.9 % 100 mL IVPB 3 g 200 mL/hr   06/29/23 1213 New Bag/Given   Ampicillin -Sulbactam (UNASYN ) 3 g in sodium chloride  0.9 % 100 mL IVPB 3 g 200 mL/hr   06/29/23 1736 New Bag/Given   Ampicillin -Sulbactam (UNASYN ) 3 g in sodium chloride  0.9 % 100 mL IVPB 3 g 200 mL/hr   06/29/23 2320 New Bag/Given   Ampicillin -Sulbactam (UNASYN ) 3 g in sodium chloride  0.9 % 100 mL IVPB 3 g 200 mL/hr   06/30/23 0520 New Bag/Given   Ampicillin -Sulbactam (UNASYN ) 3 g in sodium chloride  0.9 % 100 mL IVPB  3 g 200 mL/hr   06/30/23 1103 New Bag/Given   Ampicillin -Sulbactam (UNASYN ) 3 g in sodium chloride  0.9 % 100 mL IVPB 3 g 200 mL/hr       vitamin C   500 mg Per Tube BID   aspirin   81 mg Per Tube Daily   Chlorhexidine  Gluconate Cloth  6 each Topical Q0600   dextrose   50 mL Intravenous Once   enoxaparin  (LOVENOX ) injection  40 mg Subcutaneous Q24H   free water   30 mL Per Tube Q4H   insulin  aspart  0-15 Units Subcutaneous TID WC   leptospermum manuka honey  1 Application Topical Daily   nutrition supplement (JUVEN)  1 packet Per Tube BID BM   sodium chloride  flush  10-40 mL Intracatheter Q12H    Objective: Vital signs in last 24 hours: Temp:  [98 F (36.7 C)-99.6 F (37.6 C)] 99.6 F (37.6 C) (06/10 0747) Pulse Rate:  [59-124] 124 (06/10 0747) Resp:  [16-20] 16 (06/10 0747) BP: (94-137)/(47-68) 95/47 (06/10 0747) SpO2:  [90 %-98 %] 97 % (06/10 0747) Weight:  [66 kg] 66 kg (06/10 0500) General: Alert, cooperative, pale frail.  Head: Normocephalic, without obvious abnormality, atraumatic. Eyes: Conjunctivae clear, anicteric sclerae. Pupils are equal ENT Nares normal. No drainage or sinus tenderness. Lips, mucosa,  and tongue normal. No Thrush Neck: anterior surgical scar healed well no carotid bruit and no JVD. Back: No CVA tenderness. Lungs: b/l air entry Heart: s1s2 Abdomen: Soft, non-tender,not distended. Bowel sounds normal. No masses Sacral wound   FU photo 6/10     Initial wounds     Extremities: atraumatic, no cyanosis. No edema. No clubbing  heels      Lab Results Recent Labs    06/29/23 0419 06/30/23 0424  WBC 8.7 10.4  HGB 9.0* 10.3*  HCT 27.7* 32.0*  NA 140 140  K 3.7 6.0*  CL 108 105  CO2 26 24  BUN 18 16  CREATININE 0.59* 0.76    Microbiology: Results for orders placed or performed during the hospital encounter of 06/22/23  Aerobic Culture w Gram Stain (superficial specimen)     Status: None   Collection Time: 06/23/23  5:22 PM    Specimen: Wound  Result Value Ref Range Status   Specimen Description   Final    WOUND Performed at [facility], [address], [place], [place] [address]    Special Requests   Final    NONE Performed at [facility], [address] [place], [place] [address]    Gram Stain   Final    RARE WBC PRESENT, PREDOMINANTLY PMN RARE GRAM POSITIVE RODS    Culture   Final    ABUNDANT ENTEROCOCCUS FAECALIS MODERATE CORYNEBACTERIUM STRIATUM Standardized susceptibility testing for this organism is not available. Performed at [facility], [address] [place], [place] [address]    Report Status 06/28/2023 FINAL  Final   Organism ID, Bacteria ENTEROCOCCUS FAECALIS  Final      Susceptibility   Enterococcus faecalis - MIC*    AMPICILLIN  <=2 SENSITIVE Sensitive     VANCOMYCIN  1 SENSITIVE Sensitive     GENTAMICIN SYNERGY SENSITIVE Sensitive     * ABUNDANT ENTEROCOCCUS FAECALIS    Studies/Results: No results found.   Assessment/Plan: Multiple pressure wounds Sacrum stage IV- pt had been on levaquin and bactrim for 2 weeks. Bone is exposed on exam. Wound appears relatively clean. Wound culture - only growing enterococcus and corynebacterium  ESR > 140 PICC placed Will need IV abx for several weeks if we plan to heal this wound as failing otpt therapy Will plan unasyn - see opat order Discussed with wife.  Cervical myelopathy and central cord syndrome with Acute neurological deficit in April following a fall necessitating ACDF C3-C5    Thank you very much for the consult. Will follow with you.  Billy Franco   06/30/2023, 2:49 PM

## 2023-06-30 NOTE — Plan of Care (Signed)
  Problem: Fluid Volume: Goal: Ability to maintain a balanced intake and output will improve Outcome: Progressing   Problem: Nutritional: Goal: Maintenance of adequate nutrition will improve Outcome: Progressing   Problem: Coping: Goal: Ability to adjust to condition or change in health will improve Outcome: Not Progressing   Problem: Skin Integrity: Goal: Risk for impaired skin integrity will decrease Outcome: Not Progressing

## 2023-06-30 NOTE — TOC Progression Note (Signed)
 Transition of Care [facility]) - Progression Note    Patient Details  Name: Billy Franco MRN: [id number] Date of Birth: 1940/01/31  [facility] of [facility]) CM/SW Contact  Loman Risk, RN Phone Number: 06/30/2023, 3:50 PM  Clinical Narrative:     At this time the only confirmed bed offer is for patient to return [facility].  Per Gena at [facility] they prefer IV antibiotics to be 4 times a day.  ID MD updated  Per MD not medically appropriate to start auth at this time       Expected Discharge Plan and Services                                               Social Determinants of Health (SDOH) Interventions SDOH Screenings   Food Insecurity: No Food Insecurity (06/22/2023)  Housing: Low Risk  (06/22/2023)  Transportation Needs: No Transportation Needs (06/22/2023)  Utilities: Not At Risk (06/22/2023)  Alcohol Screen: Low Risk  (01/24/2019)  Depression (PHQ2-9): Low Risk  (02/02/2023)  Financial Resource Strain: Low Risk  (01/24/2019)  Physical Activity: Sufficiently Active (01/24/2019)  Social Connections: Socially Integrated (06/22/2023)  Stress: No Stress Concern Present (01/24/2019)  Tobacco Use: Low Risk  (06/22/2023)    Readmission Risk Interventions     No data to display

## 2023-06-30 NOTE — Inpatient Diabetes Management (Signed)
 Inpatient Diabetes Program Recommendations  AACE/ADA: New Consensus Statement on Inpatient Glycemic Control (2015)  Target Ranges:  Prepandial:   less than 140 mg/dL      [facility] postprandial:   less than 180 mg/dL (1-2 hours)      Critically ill patients:  140 - 180 mg/dL   Lab Results  Component Value Date   GLUCAP 348 (H) 06/30/2023   HGBA1C 7.8 (A) 02/02/2023    Review of Glycemic Control  Latest Reference Range & Units 06/29/23 07:53 06/29/23 11:50 06/29/23 15:26 06/29/23 21:09 06/30/23 08:40  Glucose-Capillary 70 - 99 mg/dL 161 (H) 096 (H) 045 (H) 238 (H) 348 (H)  (H): Data is abnormally high  Diabetes history: DM2 Outpatient Diabetes medications: Glipizide  2.5 mg QAM, Metformin  1500 mg QAM  Current orders for Inpatient glycemic control: novolog  0-15 units TID, Osmolite @ 65 ml/hr   Inpatient Diabetes Program Recommendations:     Might consider:   Novolog  0-15 units Q4H with Osmolite @ 65 ml/hr.    Thank you, Hays Lipschutz, MSN, CDCES Diabetes Coordinator Inpatient Diabetes Program [phone] (team pager from 8a-5p)

## 2023-06-30 NOTE — Progress Notes (Addendum)
 Infectious Disease Long Term IV Antibiotic Orders [place] Colavito April 19, 1940  Diagnosis: Sacral decub with exposed bone  Culture results Results for orders placed or performed during the hospital encounter of 06/22/23  Aerobic Culture w Gram Stain (superficial specimen)     Status: None   Collection Time: 06/23/23  5:22 PM   Specimen: Wound  Result Value Ref Range Status   Specimen Description   Final    WOUND Performed at [facility], [address], [place], [place] [address]    Special Requests   Final    NONE Performed at [facility], [address] [place], [place] [address]    Gram Stain   Final    RARE WBC PRESENT, PREDOMINANTLY PMN RARE GRAM POSITIVE RODS    Culture   Final    ABUNDANT ENTEROCOCCUS FAECALIS MODERATE CORYNEBACTERIUM STRIATUM Standardized susceptibility testing for this organism is not available. Performed at [facility], [address], [place], [place] [address]    Report Status 06/28/2023 FINAL  Final   Organism ID, Bacteria ENTEROCOCCUS FAECALIS  Final      Susceptibility   Enterococcus faecalis - MIC*    AMPICILLIN  <=2 SENSITIVE Sensitive     VANCOMYCIN  1 SENSITIVE Sensitive     GENTAMICIN SYNERGY SENSITIVE Sensitive     * ABUNDANT ENTEROCOCCUS FAECALIS     LABS Lab Results  Component Value Date   CREATININE 0.76 06/30/2023   Lab Results  Component Value Date   WBC 10.4 06/30/2023   HGB 10.3 (L) 06/30/2023   HCT 32.0 (L) 06/30/2023   MCV 94.4 06/30/2023   PLT 378 06/30/2023   Lab Results  Component Value Date   ESRSEDRATE >140 (H) 06/24/2023   Lab Results  Component Value Date   CRP 8.1 (H) 06/24/2023    Allergies:  Allergies  Allergen Reactions   Codeine Rash and Other (See Comments)    Per pt "hard on my kidneys"     Lovastatin Rash and Other (See Comments)   Tape Rash    Discharge antibiotics Unasyn  3 gm q 6 hours  PICC Care per protocol Labs weekly while on IV antibiotics -FAX  weekly labs to [phone]  CBC w diff   Comprehensive met panel CRP   Planned duration of antibiotics 6 weeks  Stop date July 14th 2025 Follow up clinic date TBD  Eartha Gold, MD

## 2023-07-01 DIAGNOSIS — L089 Local infection of the skin and subcutaneous tissue, unspecified: Secondary | ICD-10-CM | POA: Diagnosis not present

## 2023-07-01 DIAGNOSIS — Z7189 Other specified counseling: Secondary | ICD-10-CM

## 2023-07-01 DIAGNOSIS — L8990 Pressure ulcer of unspecified site, unspecified stage: Secondary | ICD-10-CM

## 2023-07-01 LAB — GLUCOSE, CAPILLARY
Glucose-Capillary: 164 mg/dL — ABNORMAL HIGH (ref 70–99)
Glucose-Capillary: 172 mg/dL — ABNORMAL HIGH (ref 70–99)
Glucose-Capillary: 173 mg/dL — ABNORMAL HIGH (ref 70–99)
Glucose-Capillary: 219 mg/dL — ABNORMAL HIGH (ref 70–99)
Glucose-Capillary: 232 mg/dL — ABNORMAL HIGH (ref 70–99)

## 2023-07-01 LAB — CBC WITH DIFFERENTIAL/PLATELET
Abs Immature Granulocytes: 0.06 10*3/uL (ref 0.00–0.07)
Basophils Absolute: 0.1 10*3/uL (ref 0.0–0.1)
Basophils Relative: 1 %
Eosinophils Absolute: 0.4 10*3/uL (ref 0.0–0.5)
Eosinophils Relative: 4 %
HCT: 26.4 % — ABNORMAL LOW (ref 39.0–52.0)
Hemoglobin: 8.2 g/dL — ABNORMAL LOW (ref 13.0–17.0)
Immature Granulocytes: 1 %
Lymphocytes Relative: 17 %
Lymphs Abs: 1.5 10*3/uL (ref 0.7–4.0)
MCH: 30.1 pg (ref 26.0–34.0)
MCHC: 31.1 g/dL (ref 30.0–36.0)
MCV: 97.1 fL (ref 80.0–100.0)
Monocytes Absolute: 0.5 10*3/uL (ref 0.1–1.0)
Monocytes Relative: 6 %
Neutro Abs: 6 10*3/uL (ref 1.7–7.7)
Neutrophils Relative %: 71 %
Platelets: 341 10*3/uL (ref 150–400)
RBC: 2.72 MIL/uL — ABNORMAL LOW (ref 4.22–5.81)
RDW: 14.7 % (ref 11.5–15.5)
WBC: 8.5 10*3/uL (ref 4.0–10.5)
nRBC: 0 % (ref 0.0–0.2)

## 2023-07-01 LAB — BASIC METABOLIC PANEL WITH GFR
Anion gap: 4 — ABNORMAL LOW (ref 5–15)
BUN: 29 mg/dL — ABNORMAL HIGH (ref 8–23)
CO2: 31 mmol/L (ref 22–32)
Calcium: 8.4 mg/dL — ABNORMAL LOW (ref 8.9–10.3)
Chloride: 108 mmol/L (ref 98–111)
Creatinine, Ser: 0.66 mg/dL (ref 0.61–1.24)
GFR, Estimated: >60 mL/min (ref >60)
Glucose, Bld: 208 mg/dL — ABNORMAL HIGH (ref 70–99)
Potassium: 4.1 mmol/L (ref 3.5–5.1)
Sodium: 143 mmol/L (ref 135–145)

## 2023-07-01 LAB — PHOSPHORUS: Phosphorus: 2.6 mg/dL (ref 2.5–4.6)

## 2023-07-01 LAB — MAGNESIUM: Magnesium: 2 mg/dL (ref 1.7–2.4)

## 2023-07-01 MED ORDER — INSULIN ASPART 100 UNIT/ML IJ SOLN
0.0000 [IU] | Freq: Four times a day (QID) | INTRAMUSCULAR | Status: DC
  Administered 2023-07-01: 5 [IU] via SUBCUTANEOUS
  Administered 2023-07-01 (×2): 3 [IU] via SUBCUTANEOUS
  Administered 2023-07-02: 2 [IU] via SUBCUTANEOUS
  Administered 2023-07-02: 5 [IU] via SUBCUTANEOUS
  Administered 2023-07-02 – 2023-07-03 (×3): 3 [IU] via SUBCUTANEOUS
  Administered 2023-07-03: 2 [IU] via SUBCUTANEOUS
  Administered 2023-07-03: 5 [IU] via SUBCUTANEOUS
  Administered 2023-07-03: 2 [IU] via SUBCUTANEOUS
  Administered 2023-07-04: 5 [IU] via SUBCUTANEOUS
  Administered 2023-07-04 (×2): 3 [IU] via SUBCUTANEOUS
  Administered 2023-07-05 (×2): 5 [IU] via SUBCUTANEOUS
  Administered 2023-07-05: 2 [IU] via SUBCUTANEOUS
  Administered 2023-07-05 – 2023-07-06 (×2): 3 [IU] via SUBCUTANEOUS
  Administered 2023-07-07: 11 [IU] via SUBCUTANEOUS
  Filled 2023-07-01 (×22): qty 1

## 2023-07-01 NOTE — Consult Note (Addendum)
 Consultation Note Date: 07/01/2023   Patient Name: Billy Franco  DOB: 1940-03-13  MRN: [id number]  Age / Sex: 83 y.o., male  PCP: Franco, Krichna, MD Referring Physician: Melodi Sprung, DO  Reason for Consultation: Establishing goals of care  HPI/Patient Profile: PER H&P :Billy Franco is a 83 y.o. 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   Clinical Assessment and Goals of Care: Notes and labs reviewed.  In to see patient with pharmacy Billy Franco.  He is currently resting in bed with his sister at bedside.  Patient states he is amenable to speaking with her present.  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  He discusses that the last sermon they preached prior to his fall was on the book with Job.  He discusses that prior to his fall he was fully functional and still mowed his yard.  He discusses having surgery, going to rehab where his swallowing declined and his functional status declined, and then returning here to the hospital.  He states he has faith that God will heal him.  He states his arms have more strength than they have had.  He states he is hopeful and has faith that his swallowing will return.  He states he is amenable to continuing his PEG tube for another few months if needed, but would not want to live long-term with a feeding tube.   He initially states that he would want CPR, ventilator support and other measures to continue his life.  With further conversation, he states  if I am dead let me go.  He then shares he  has papers that were completed around 5 years ago that addressed end-of-life decision making and states his wife and daughter would know what decisions to make.  He states he is sure that God will heal him, and that God is not ready for him yet.  Discussed following up to speak with him and his family to discuss further.  Patient's sister shares that wife is currently at a doctor's appointment herself.  She discusses the need for a stress test for patient's wife and that she has been under a lot of stress.  PMT will continue to follow.  ADDENDUM: Attempted to reach wife and message received that VM box has not been set up.     SUMMARY OF RECOMMENDATIONS    Will need to discuss goals of care with wife, and daughter if possible present at bedside.      Primary Diagnoses: Present on Admission:  Pressure ulcer   I have reviewed the medical record, interviewed the patient and family, and examined the patient. The following aspects are pertinent.  Past Medical History:  Diagnosis Date   Arthritis  Lower back, hips   Atrial fibrillation (HCC)    CHF (congestive heart failure) (HCC)    CKD (chronic kidney disease), stage III (HCC)    Complication of anesthesia    has crooked airway  Diff breathing after extubated (after kidney stone procedure 07/2013)   Diabetes mellitus without complication (HCC)    GERD (gastroesophageal reflux disease)    Hypertension    Hypothyroidism    Nephrolithiasis    Social History   Socioeconomic History   Marital status: Married    Spouse name: Billy Franco    Number of children: 3   Years of education: Not on file   Highest education level: Not on file  Occupational History   Occupation: retired     Comment: IT trainer at a [employer]   Tobacco Use   Smoking status: Never   Smokeless tobacco: Never  Vaping Use   Vaping status: Never Used  Substance and Sexual Activity   Alcohol use: No    Alcohol/week: 0.0 standard drinks of alcohol   Drug  use: No   Sexual activity: Not Currently  Other Topics Concern   Not on file  Social History Narrative   Married, had 3 children, one died at a MVA at age 63   Geralynn Knife of a church    Social Drivers of Corporate investment banker Strain: Low Risk  (01/24/2019)   Overall Financial Resource Strain (CARDIA)    Difficulty of Paying Living Expenses: Not hard at all  Food Insecurity: No Food Insecurity (06/22/2023)   Hunger Vital Sign    Worried About Running Out of Food in the Last Year: Never true    Ran Out of Food in the Last Year: Never true  Transportation Needs: No Transportation Needs (06/22/2023)   PRAPARE - Administrator, Civil Service (Medical): No    Lack of Transportation (Non-Medical): No  Physical Activity: Sufficiently Active (01/24/2019)   Exercise Vital Sign    Days of Exercise per Week: 5 days    Minutes of Exercise per Session: 60 min  Stress: No Stress Concern Present (01/24/2019)   [employer] of Occupational Health - Occupational Stress Questionnaire    Feeling of Stress : Not at all  Social Connections: Socially Integrated (06/22/2023)   Social Connection and Isolation Panel [NHANES]    Frequency of Communication with Friends and Family: Three times a week    Frequency of Social Gatherings with Friends and Family: Three times a week    Attends Religious Services: More than 4 times per year    Active Member of Clubs or Organizations: Yes    Attends Engineer, structural: More than 4 times per year    Marital Status: Married   Family History  Problem Relation Age of Onset   Heart disease Father    Scheduled Meds:  vitamin C   500 mg Per Tube BID   aspirin   81 mg Per Tube Daily   Chlorhexidine  Gluconate Cloth  6 each Topical Q0600   dextrose   50 mL Intravenous Once   enoxaparin  (LOVENOX ) injection  40 mg Subcutaneous Q24H   free water   30 mL Per Tube Q4H   insulin  aspart  0-15 Units Subcutaneous QID   leptospermum manuka honey  1 Application  Topical Daily   midodrine  10 mg Oral TID WC   nutrition supplement (JUVEN)  1 packet Per Tube BID BM   sodium chloride  flush  10-40 mL Intracatheter Q12H   Continuous Infusions:  sodium chloride  75 mL/hr at 07/01/23 0406   ampicillin -sulbactam (UNASYN ) IV 3 g (07/01/23 1143)   feeding supplement (OSMOLITE 1.5 CAL) 65 mL/hr at 06/30/23 1802   PRN Meds:.acetaminophen  **OR** acetaminophen , bisacodyl , HYDROmorphone  (DILAUDID ) injection, ondansetron  **OR** ondansetron  (ZOFRAN ) IV, mouth rinse, polyethylene glycol, sodium chloride , sodium chloride  flush Medications Prior to Admission:  Prior to Admission medications   Medication Sig Start Date End Date Taking? Authorizing Provider  acetaminophen  (TYLENOL ) 650 MG CR tablet Take 650 mg by mouth every 8 (eight) hours as needed for pain.   Yes [provider]  aspirin  EC 81 MG tablet Take 1 tablet (81 mg total) by mouth daily. Swallow whole. 05/22/23  Yes Luna Salinas, MD  atorvastatin  (LIPITOR) 10 MG tablet Take 1 tablet (10 mg total) by mouth daily at 6 PM. 02/02/23  Yes Franco, Krichna, MD  Cholecalciferol (VITAMIN D -1000 MAX ST) 25 MCG (1000 UT) tablet Take 1 tablet by mouth daily.   Yes [provider]  clopidogrel  (PLAVIX ) 75 MG tablet Take 1 tablet (75 mg total) by mouth daily. Please resume from 05/25/2023 05/22/23  Yes Amin, Sumayya, MD  Cyanocobalamin (B-12) 1000 MCG TBCR Take 1,000 mcg by mouth every other day. 01/02/20  Yes [provider]  docusate sodium  (COLACE) 50 MG capsule Take 100 mg by mouth 2 (two) times daily as needed for mild constipation.   Yes [provider]  glipiZIDE  2.5 MG TABS Take 1 tablet by mouth daily with breakfast. 02/02/23  Yes Franco, Krichna, MD  levofloxacin (LEVAQUIN) 750 MG tablet Take 750 mg by mouth daily.   Yes [provider]  levothyroxine  (SYNTHROID ) 25 MCG tablet TAKE 1 TABLET (25 MCG TOTAL) BY MOUTH DAILY BEFORE BREAKFAST. ON SUNDAYS, TAKE 2 TABLETS BY MOUTH BEFORE  BREAKFAST. 04/27/23  Yes Franco, Krichna, MD  magnesium  oxide (MAG-OX) 400 MG tablet Take 400 mg by mouth daily.   Yes [provider]  metFORMIN  (GLUCOPHAGE -XR) 750 MG 24 hr tablet Take 2 tablets (1,500 mg total) by mouth daily with breakfast. 02/02/23  Yes Franco, Krichna, MD  midodrine (PROAMATINE) 10 MG tablet Take 10 mg by mouth 3 (three) times daily.   Yes [provider]  Multiple Vitamin (MULTIVITAMIN WITH MINERALS) TABS tablet Take 1 tablet by mouth daily. 05/22/23  Yes Luna Salinas, MD  Nutritional Supplements (THERALITH XR PO) Take 2 tablets by mouth 2 (two) times daily. 3.752-45-45-49.5mg . Take 2 tablets in the evening and 2 tablets at bedtime.   Yes [provider]  ondansetron  (ZOFRAN ) 4 MG tablet Take 1 tablet (4 mg total) by mouth every 6 (six) hours as needed for nausea or vomiting. 05/22/23  Yes Luna Salinas, MD  pantoprazole  (PROTONIX ) 40 MG tablet Take 1 tablet (40 mg total) by mouth daily. 11/10/22 11/05/23 Yes Brigitte Canard, PA-C  polyethylene glycol powder (GLYCOLAX /MIRALAX ) 17 GM/SCOOP powder Take 17 g by mouth daily. 11/10/22  Yes Brigitte Canard, PA-C  rOPINIRole  (REQUIP ) 2 MG tablet Take 1 tablet by mouth 2 (two) times daily. 05/12/22  Yes [provider]  senna (SENOKOT) 8.6 MG TABS tablet Take 1 tablet (8.6 mg total) by mouth 2 (two) times daily. 05/22/23  Yes Luna Salinas, MD  sodium hypochlorite (DAKIN'S FULL STRENGTH) 0.5 % SOLN Irrigate with 1 Application as directed 2 (two) times daily.   Yes [provider]  sulfamethoxazole-trimethoprim (BACTRIM DS) 800-160 MG tablet Take 1 tablet by mouth 2 (two) times daily.   Yes [provider]  tamsulosin  (FLOMAX ) 0.4 MG CAPS capsule TAKE  1 CAPSULE BY MOUTH EVERY EVENING 06/01/23  Yes Stoioff, Kizzie Perks, MD  traMADol  (ULTRAM ) 50 MG tablet Take 50 mg by mouth every 6 (six) hours as needed for moderate pain (pain score 4-6).   Yes [provider]  triamcinolone  (KENALOG ) 0.1 %  Apply 1 application topically 2 (two) times daily as needed (rash).   Yes [provider]  aluminum hydroxide-magnesium  carbonate (GAVISCON) 95-358 MG/15ML SUSP Take by mouth.    [provider]  blood glucose meter kit and supplies 1 each by Other route as directed. Dispense based on patient and insurance preference. Use up to four times daily as directed. (FOR ICD-10 E10.9, E11.9). 09/09/21   Franco, Krichna, MD  carvedilol  (COREG ) 3.125 MG tablet Take 3.125 mg by mouth 2 (two) times daily. Patient not taking: Reported on 06/22/2023 05/31/23   [provider]  furosemide  (LASIX ) 40 MG tablet Take 40 mg by mouth daily. Patient not taking: Reported on 06/22/2023 05/03/23   [provider]  Lancets (ONETOUCH DELICA PLUS [place]) MISC CHECK BLOOD SUGAR AT NOON 03/02/23   Franco, Krichna, MD  losartan  (COZAAR ) 25 MG tablet Take 25 mg by mouth daily. Patient not taking: Reported on 06/22/2023 05/31/23   [provider]  ONETOUCH ULTRA test strip USE AS DIRECTED TO CHECK BLOOD GLUCOSE ONCE DAILY. 05/26/23   Franco, Krichna, MD  spironolactone  (ALDACTONE ) 25 MG tablet Take 12.5 mg by mouth daily. Patient not taking: Reported on 06/22/2023    Burney Carter D, MD   Allergies  Allergen Reactions   Codeine Rash and Other (See Comments)    Per pt hard on my kidneys     Lovastatin Rash and Other (See Comments)   Tape Rash   Review of Systems  Neurological:  Positive for weakness.    Physical Exam Pulmonary:     Effort: Pulmonary effort is normal.  Skin:    General: Skin is warm and dry.  Neurological:     Mental Status: He is alert.     Vital Signs: BP (!) 113/55 (BP Location: Left Wrist)   Pulse 86   Temp 98.4 F (36.9 C) (Oral)   Resp 16   Ht 5' 7 (1.702 m)   Wt 66 kg   SpO2 100%   BMI 22.79 kg/m  Pain Scale: 0-10 POSS *See Group Information*: 1-Acceptable,Awake and alert Pain Score: 0-No pain   SpO2: SpO2: 100 % O2 Device:SpO2: 100  % O2 Flow Rate: .O2 Flow Rate (L/min): 2 L/min  IO: Intake/output summary:  Intake/Output Summary (Last 24 hours) at 07/01/2023 1212 Last data filed at 07/01/2023 1610 Gross per 24 hour  Intake 4063.76 ml  Output 775 ml  Net 3288.76 ml    LBM: Last BM Date : 06/30/23 Baseline Weight: Weight: 65.8 kg Most recent weight: Weight: 66 kg       Signed by: Meribeth Standard, NP   Please contact Palliative Medicine Team phone at [phone] for questions and concerns.  For individual provider: See Tilford Foley

## 2023-07-01 NOTE — Progress Notes (Signed)
 PHARMACY CONSULT NOTE FOR:  OUTPATIENT  PARENTERAL ANTIBIOTIC THERAPY (OPAT)  Indication: Sacral decubitus ulcer  Regimen: Unasyn  3 gm IV Q 6 hours  End date: 08/03/23   IV antibiotic discharge orders are pended. To discharging provider:  please sign these orders via discharge navigator,  Select New Orders & click on the button choice - Manage This Unsigned Work.     Thank you for allowing pharmacy to be a part of this patient's care.  Denson Flake, PharmD, BCPS, BCIDP Infectious Diseases Clinical Pharmacist Phone: [phone] 07/01/2023, 9:47 AM

## 2023-07-01 NOTE — Progress Notes (Signed)
 Occupational Therapy Treatment Patient Details Name: Billy Franco MRN: [id number] DOB: [birth year] Today's Date: 07/01/2023   History of present illness Pt is an 83 y.o. male presenting to hospital with N/V and dehydration; pt also with non-healing sacral wound.  Recent hospitalization s/p fall and s/p C3-5 ACDF 05/11/23 (pt with central cord syndrome).  Pt admitted with infected stage IV sacral ulcer, chronic osteomyelitis, worsening dysphagia, intractable N/V, unintentional weight loss, FTT, and chronic urinary retention.  PMH includes DM, CKD, htn, HLD, CHF, CAD, a-fib, chronic back pain, C3-5 ACDF.   OT comments  Pt seen for OT/PT co-tx this date. Bilateral TED hose and TOTAL A for ACE wraps applied prior to mobility. Pt continues to require MAX - TOTAL A for bed mobility, poor seated balance with R lateral lean and requires MOD with occasional MIN - CGA to orient to midline. Improved tolerance to upright posture this date. Pt returned to supine and rolled for pressure relief to opposite side. Discharge recommendations updated. OT will continue to follow for functional gains. Anticipate pt will continue to require +2 for all mobility and ADL efforts.   BP start of session 112/41 (60) BP sitting EOB 125/94 (99) BP end of session 118/69 (76)         If plan is discharge home, recommend the following:  Two people to help with walking and/or transfers;A lot of help with bathing/dressing/bathroom;Direct supervision/assist for medications management;Assist for transportation;Assistance with cooking/housework;Help with stairs or ramp for entrance;Assistance with feeding   Equipment Recommendations  Other (comment)       Precautions / Restrictions Precautions Precautions: Fall;Cervical Precaution Booklet Issued: No Recall of Precautions/Restrictions: Impaired Precaution/Restrictions Comments: Hard collar when OOB (apply in sitting) Required Braces or Orthoses: Cervical Brace Cervical Brace:  Hard collar Restrictions Weight Bearing Restrictions Per Provider Order: No Other Position/Activity Restrictions: PEG tube, sacral wound       Mobility Bed Mobility Overal bed mobility: Needs Assistance Bed Mobility: Supine to Sit, Sit to Supine, Rolling Rolling: Max assist, +2 for physical assistance, Used rails Sidelying to sit: Max assist, +2 for physical assistance, HOB elevated, Total assist Supine to sit: Total assist, HOB elevated, +2 for physical assistance, Max assist Sit to supine: Total assist, +2 for physical assistance   General bed mobility comments: Pt recieved in sidelying, transitions to seated EOB with TOTAL +2 (+3 present for lines/leads). BP assessed throughout.    Transfers Overall transfer level: Needs assistance                 General transfer comment: NT     Balance Overall balance assessment: Needs assistance Sitting-balance support: Single extremity supported, Feet supported, Bilateral upper extremity supported Sitting balance-Leahy Scale: Poor Sitting balance - Comments: Pt occassionaly able to self-correct to midline with CGA, R lateral lean with occassional posterior lean. Poor midline orientation, requires external support to maintain static balance. Attempted functional reaching tasks with pt shifting to anterior lean requiring correction Postural control: Posterior lean, Right lateral lean                                 ADL either performed or assessed with clinical judgement   ADL Overall ADL's : Needs assistance/impaired Eating/Feeding: NPO                                     General ADL  Comments: Session focused on static seated balance      Cognition Arousal: Alert Behavior During Therapy: Flat affect Cognition: Cognition impaired     Awareness: Online awareness impaired, Intellectual awareness impaired Memory impairment (select all impairments): Short-term memory, Working memory Attention  impairment (select first level of impairment): Focused attention, Sustained attention   OT - Cognition Comments: Question pt's insight into overall situation and deficits                                   General Comments Donned bilateral ACE wraps prior to mobility, BP assessed throughout. Hard c-collar donned supine prior to OOB.    Pertinent Vitals/ Pain       Pain Assessment Pain Assessment: Faces Faces Pain Scale: Hurts a little bit Pain Location: back and BLE pain Pain Descriptors / Indicators: Discomfort, Aching Pain Intervention(s): Limited activity within patient's tolerance, Monitored during session, Premedicated before session, Repositioned   Frequency  Min 2X/week        Progress Toward Goals  OT Goals(current goals can now be found in the care plan section)  Progress towards OT goals: Progressing toward goals  Acute Rehab OT Goals OT Goal Formulation: With patient Time For Goal Achievement: 07/07/23 Potential to Achieve Goals: Fair  Plan      Co-evaluation    PT/OT/SLP Co-Evaluation/Treatment: Yes Reason for Co-Treatment: Complexity of the patient's impairments (multi-system involvement);For patient/therapist safety;To address functional/ADL transfers PT goals addressed during session: Mobility/safety with mobility;Balance;Proper use of DME;Strengthening/ROM OT goals addressed during session: ADL's and self-care      AM-PAC OT 6 Clicks Daily Activity     Outcome Measure   Help from another person eating meals?: Total Help from another person taking care of personal grooming?: Total Help from another person toileting, which includes using toliet, bedpan, or urinal?: Total Help from another person bathing (including washing, rinsing, drying)?: Total Help from another person to put on and taking off regular upper body clothing?: Total Help from another person to put on and taking off regular lower body clothing?: Total 6 Click Score: 6     End of Session Equipment Utilized During Treatment: Oxygen  OT Visit Diagnosis: Other abnormalities of gait and mobility (R26.89);Hemiplegia and hemiparesis;Feeding difficulties (R63.3) Hemiplegia - Right/Left: Right Hemiplegia - dominant/non-dominant: Dominant Hemiplegia - caused by: Other cerebrovascular disease   Activity Tolerance Patient tolerated treatment well   Patient Left in bed;with call bell/phone within reach;with family/visitor present   Nurse Communication Mobility status        Time: 1610-9604 OT Time Calculation (min): 43 min  Charges: OT General Charges $OT Visit: 1 Visit OT Treatments $Self Care/Home Management : 23-37 mins  Doll Frazee L. Marquavius Scaife, OTR/L  07/01/23, 5:00 PM

## 2023-07-01 NOTE — Hospital Course (Addendum)
 Hospital course / significant events:   Billy Franco is a 83 y.o. 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 Upon SLP, barium swallow study- patient has severe pharyngeal musculature weakness and high risk of aspiration - PEG placed here.   Orthostatic hypotension impacting PT participation and was on multiple antihypertensives and midodrine  simultaneously at home. Holding all anti-HTN meds he is on borderline lower limits of normal BP and was able to participate with PT.   Complicated GOC discussions w/ patient, his wife, palliative care team, hospitalist team. See IPAL note and progress notes 07/02/23, 07/03/23. Patient and his wife would like to continue current care to give some time to speak with her children regarding next steps. Family meeting today 07/05/23 - see IPAL note and A/P below      Consultants:  General surgery Neurosurgery  Infectious disease Interventional radiology  Palliative care   Procedures/Surgeries: 6/6 G tube placement       ASSESSMENT & PLAN:   Goals of Care / Advanced Care Planning  See IPAL note 07/02/23, 07/05/23  Note pt does NOT have FULL decision making capacity, defer to wife re: complex decisions/plans. See previous notes for further comment on capacity.  Pt more alert/coherent past couple days and is clear he wants to go back to his house and die at home peacefully. Spoke w/ family at bedside today, daughter, son, and their spouses were present as well as patient's wife and patient. All  questions were answered. Hospice to follow. See IPAL note DNR Goals/plan are as follows: Home with hospice, need DME and need instructions on care for wounds, admin meds, cleaning, etc.  Meds to continue to potentially prolong life as long as patient is lucid: tube feeds, insulin , meds via PEG Meds to treat symptoms: leaving PEG in place for med admin at home.  Meds to d/c on discharge: antibiotics  Anticipate over time he will become more confused, in more pain - at this time would transition to comfort-ONLY based treatments, stop tube feeds    Stage IV sacral ulcer  sacral osteomyelitis wound care Educated family that wound care / pain control will be the goal, will not go home on antibiotics    Orthostatic hypotension Midodrine  to avoid dizziness/lightheadedness    Neurogenic Dysphagia, worsening Unintentional weight loss  Failure to thrive Severe malnutrition Patient underwent PEG tube placement on 06/26/2023 Continue tube feeding as long as pt/family desire I was clear w/ them that nutrition will maybe buy some time but will not result in recovery, it is maybe keeping things going for now but should eventually stop once he gets more confused/weak.  Continue to monitor electrolytes in-house  Speech therapy and dietitian on board Palliative care on board Considering for po feedings as desired w/ recognition of aspiration risk -  I was clear w/ family we will not give him food/drink here but they can do so at home if desired Continue Foley   DM2 Continue SSI and CBG monitoring  Added Semglee   Titrate as needed   Hyperkalemia monitor potassium    Anemia of chronic disease baseline hgb around 10.5  monitor CBC   CKD stage IIIb Monitor BMP for now    Chronic HFrEF Euvolemic Monitor fluid status    PAF In sinus rhythm Not on anticoagulation due to history of gross hematuria Consider beta blocker if palpitations become uncomfortable     No concerns based on BMI:  Body mass index is 23.18 kg/m.Aaron Aas Significantly low or high BMI is associated with higher medical risk. However he is still malnourished  Underweight - under 18  overweight - 25 to 29 obese - 30 or more Class 1 obesity: BMI of 30.0 to 34 Class 2 obesity: BMI of 35.0 to 39 Class 3 obesity: BMI of 40.0 to 49 Super Morbid Obesity: BMI 50-59 Super-super Morbid Obesity: BMI 60+ Healthy nutrition and physical activity advised as adjunct to other disease management and risk reduction treatments    DVT prophylaxis: lovenox  IV fluids: none at this time  Nutrition: tube feeds Central lines / other devices: PEG tube, Foley  Code Status: DNR ACP documentation reviewed:  none on file in [facility]  TOC needs: DME and home hospice Medical barriers to dispo: tain family to administer meds / wound care

## 2023-07-01 NOTE — Plan of Care (Signed)
  Problem: Coping: Goal: Ability to adjust to condition or change in health will improve Outcome: Progressing   Problem: Nutritional: Goal: Maintenance of adequate nutrition will improve Outcome: Progressing   Problem: Activity: Goal: Risk for activity intolerance will decrease Outcome: Progressing   Problem: Coping: Goal: Level of anxiety will decrease Outcome: Progressing   Problem: Pain Managment: Goal: General experience of comfort will improve and/or be controlled Outcome: Progressing   Problem: Safety: Goal: Ability to remain free from injury will improve Outcome: Progressing

## 2023-07-01 NOTE — Progress Notes (Signed)
 PROGRESS NOTE    Billy Franco   ONG:[id number] DOB: 1940-04-06  DOA: 06/22/2023 Date of Service: 07/01/23 which is hospital day 9  PCP: Sowles, Krichna, MD    Hospital course / significant events:   Billy Franco is a 83 y.o. 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 Upon SLP, barium swallow study- patient has severe pharyngeal musculature weakness and high risk of aspiration - PEG placed here.   Orthostatic hypotension impacting PT participation and was inexplicably on multiple antihypertensives and midodrine simultaneously. Holding all meds he is on borderline lower limits of normal BP and was able to participate with PT.       Consultants:  General surgery Neurosurgery  Infectious disease Interventional radiology  Palliative care   Procedures/Surgeries: 6/6 G tube placement       ASSESSMENT & PLAN:   Bleed from Gtube  Per RN report, no active bleeding noted but pt's gown was soaked bright red blood HH stable No blood in tube itself - on exam no bleeding Monitor through today into tomorrow CBC  Stage IV sacral ulcer  sacral osteomyelitis wound care Continue current antibiotics Infectious disease on board - recommends 6 weeks of antibiotics PT OT has recommended acute rehab placement   Severe malnutrition Dietitian on board Continue tube feeding via PEG tube   Orthostatic hypotension Midodrine    Neurogenic Dysphagia, worsening Patient underwent PEG tube placement on 06/26/2023 Continue tube  feeding Continue to monitor electrolytes to prevent refeeding syndrome Speech therapy and dietitian on board   Hyperkalemia Continue to monitor potassium closely   Unintentional weight loss  Failure to thrive in setting of malnutrition, dysphagia treat as above PT/OT, RD   Anemia of chronic disease baseline hgb around 10.5 Continue to monitor CBC   IIDM Continue SSI and CBG monitoring    Chronic urinary retention No acute concern   CKD stage IIIb Euvolemic, creatinine level stable Monitor BMP   Chronic HFrEF Euvolemic Continue home BP/CHF medications   PAF In sinus rhythm Not on anticoagulation due to history of gross hematuria      No concerns based on BMI: Body mass index is 22.79 kg/m.Aaron Aas Significantly low or high BMI is associated with higher medical risk. However he is still malnourished  Underweight - under 18  overweight - 25 to 29 obese - 30 or more Class 1 obesity: BMI of 30.0 to 34 Class 2 obesity: BMI of 35.0 to 39 Class 3 obesity: BMI of 40.0 to 49 Super Morbid Obesity: BMI 50-59 Super-super Morbid Obesity: BMI 60+ Healthy nutrition and physical activity advised as adjunct to other disease management and risk reduction treatments    DVT prophylaxis: lovenox  IV fluids: NS continuous IV fluids  Nutrition: tube feeds Central lines / other devices: G tube  Code Status: FULL CODE ACP documentation reviewed:  none on file in [facility]  TOC needs: placement  Medical barriers to dispo: bleeding today from G tube. Expected medical readiness for discharge possible tomorrow.              Subjective / Brief ROS:  Patient reports no concerns  Denies  CP/SOB.  Pain controlled.  Denies new weakness.  Tolerating tube feeds.  Reports no concerns w/ urination/defecation.   Family Communication: wife at bediside on rounds     Objective Findings:  Vitals:   07/01/23 0146 07/01/23 0545 07/01/23 0901 07/01/23 1458  BP: (!) 93/47 (!) 90/35 (!)  113/55 118/72  Pulse: 87 94 86 96  Resp: 20 20 16 16   Temp: 99.5 F (37.5 C) 99 F (37.2 C) 98.4 F (36.9 C) 98.6 F (37 C)  TempSrc:  Oral Oral   SpO2: 100% 100% 100% 100%  Weight:      Height:        Intake/Output Summary (Last 24 hours) at 07/01/2023 1746 Last data filed at 07/01/2023 1610 Gross per 24 hour  Intake 826.68 ml  Output 775 ml  Net 51.68 ml   Filed Weights   06/26/23 0433 06/26/23 1339 06/30/23 0500  Weight: 63 kg 63 kg 66 kg    Examination:  Physical Exam Constitutional:      General: He is not in acute distress.    Appearance: He is ill-appearing (thin, frail).  Cardiovascular:     Rate and Rhythm: Normal rate and regular rhythm.  Pulmonary:     Effort: Pulmonary effort is normal.     Breath sounds: Normal breath sounds.  Abdominal:     General: Abdomen is flat.     Palpations: Abdomen is soft.     Comments: G tube in place, no active bleedin from site or around it, suture in place   Skin:    General: Skin is warm and dry.  Neurological:     Mental Status: He is alert. Mental status is at baseline.  Psychiatric:        Mood and Affect: Mood normal.        Behavior: Behavior normal.          Scheduled Medications:   vitamin C   500 mg Per Tube BID   aspirin   81 mg Per Tube Daily   Chlorhexidine  Gluconate Cloth  6 each Topical Q0600   dextrose   50 mL Intravenous Once   enoxaparin  (LOVENOX ) injection  40 mg Subcutaneous Q24H   free water   30 mL Per Tube Q4H   insulin  aspart  0-15 Units Subcutaneous QID   leptospermum manuka honey  1 Application Topical Daily   midodrine  10 mg Oral TID WC   nutrition supplement (JUVEN)  1 packet Per Tube BID BM   sodium chloride  flush  10-40 mL Intracatheter Q12H    Continuous Infusions:  sodium chloride  75 mL/hr at 07/01/23 0406   ampicillin -sulbactam (UNASYN ) IV 3 g (07/01/23 1713)   feeding supplement (OSMOLITE 1.5 CAL) 65 mL/hr at 06/30/23 1802    PRN Medications:  acetaminophen  **OR**  acetaminophen , bisacodyl , HYDROmorphone  (DILAUDID ) injection, ondansetron  **OR** ondansetron  (ZOFRAN ) IV, mouth rinse, polyethylene glycol, sodium chloride , sodium chloride  flush  Antimicrobials from admission:  Anti-infectives (From admission, onward)    Start     Dose/Rate Route Frequency Ordered Stop   06/27/23 1400  vancomycin  (VANCOCIN ) IVPB 1000 mg/200 mL premix  Status:  Discontinued        1,000 mg 200 mL/hr over 60 Minutes Intravenous Every 24 hours 06/26/23 1322 06/28/23 1043   06/26/23 1700  Ampicillin -Sulbactam (UNASYN ) 3 g in sodium chloride  0.9 % 100 mL IVPB        3 g 200 mL/hr over 30 Minutes Intravenous Every 6 hours 06/26/23 1558     06/26/23 0000  ceFAZolin  (  ANCEF ) IVPB 2g/100 mL premix        2 g 200 mL/hr over 30 Minutes Intravenous To Radiology 06/24/23 1010 06/26/23 0348   06/23/23 1800  ceFEPIme  (MAXIPIME ) 2 g in sodium chloride  0.9 % 100 mL IVPB  Status:  Discontinued        2 g 200 mL/hr over 30 Minutes Intravenous Every 8 hours 06/23/23 1110 06/26/23 1558   06/23/23 1200  vancomycin  (VANCOREADY) IVPB 1250 mg/250 mL  Status:  Discontinued        1,250 mg 166.7 mL/hr over 90 Minutes Intravenous Every 24 hours 06/22/23 1608 06/23/23 1113   06/23/23 1200  vancomycin  (VANCOREADY) IVPB 1500 mg/300 mL  Status:  Discontinued        1,500 mg 150 mL/hr over 120 Minutes Intravenous Every 24 hours 06/23/23 1113 06/26/23 1322   06/22/23 2200  ceFEPIme  (MAXIPIME ) 2 g in sodium chloride  0.9 % 100 mL IVPB  Status:  Discontinued        2 g 200 mL/hr over 30 Minutes Intravenous Every 12 hours 06/22/23 1608 06/23/23 1110   06/22/23 1200  ceFEPIme  (MAXIPIME ) 2 g in sodium chloride  0.9 % 100 mL IVPB        2 g 200 mL/hr over 30 Minutes Intravenous  Once 06/22/23 1158 06/22/23 1302   06/22/23 1200  vancomycin  (VANCOREADY) IVPB 1500 mg/300 mL        1,500 mg 150 mL/hr over 120 Minutes Intravenous  Once 06/22/23 1158 06/22/23 1519           Data Reviewed:  I have  personally reviewed the following...  CBC: Recent Labs  Lab 06/27/23 0548 06/28/23 0441 06/29/23 0419 06/30/23 0424 07/01/23 0405  WBC 10.0 9.8 8.7 10.4 8.5  NEUTROABS 8.0* 7.4 6.3 8.1* 6.0  HGB 9.7* 8.8* 9.0* 10.3* 8.2*  HCT 29.8* 26.2* 27.7* 32.0* 26.4*  MCV 92.5 92.6 93.9 94.4 97.1  PLT 342 320 340 378 341   Basic Metabolic Panel: Recent Labs  Lab 06/27/23 0548 06/28/23 0441 06/29/23 0419 06/30/23 0424 06/30/23 1833 07/01/23 0405  NA 138 138 140 140  --  143  K 3.6 3.7 3.7 6.0* 4.6 4.1  CL 110 110 108 105  --  108  CO2 22 22 26 24   --  31  GLUCOSE 230* 235* 296* 310*  --  208*  BUN 16 28* 18 16  --  29*  CREATININE 0.61 0.62 0.59* 0.76  --  0.66  CALCIUM  8.8* 8.5* 8.4* 8.7*  --  8.4*  MG 1.8 1.7 1.6* 2.0  --  2.0  PHOS 1.8* 2.5 2.0* 2.7  --  2.6   GFR: Estimated Creatinine Clearance: 66.5 mL/min (by C-G formula based on SCr of 0.66 mg/dL). Liver Function Tests: Recent Labs  Lab 06/25/23 0525  ALBUMIN 2.0*   No results for input(s): LIPASE, AMYLASE in the last 168 hours. No results for input(s): AMMONIA in the last 168 hours. Coagulation Profile: Recent Labs  Lab 06/25/23 2340  INR 1.3*   Cardiac Enzymes: No results for input(s): CKTOTAL, CKMB, CKMBINDEX, TROPONINI in the last 168 hours. BNP (last 3 results) No results for input(s): PROBNP in the last 8760 hours. HbA1C: No results for input(s): HGBA1C in the last 72 hours. CBG: Recent Labs  Lab 06/30/23 1615 06/30/23 2143 06/30/23 2356 07/01/23 0603 07/01/23 1404  GLUCAP 196* 229* 224* 232* 173*   Lipid Profile: No results for input(s): CHOL, HDL, LDLCALC, TRIG, CHOLHDL, LDLDIRECT in the last 72 hours. Thyroid   Function Tests: No results for input(s): TSH, T4TOTAL, FREET4, T3FREE, THYROIDAB in the last 72 hours. Anemia Panel: No results for input(s): VITAMINB12, FOLATE, FERRITIN, TIBC, IRON, RETICCTPCT in the last 72 hours. Most Recent  Urinalysis On File:     Component Value Date/Time   COLORURINE YELLOW 06/22/2023 1010   APPEARANCEUR CLEAR 06/22/2023 1010   LABSPEC 1.020 06/22/2023 1010   PHURINE 5.5 06/22/2023 1010   GLUCOSEU 250 (A) 06/22/2023 1010   HGBUR MODERATE (A) 06/22/2023 1010   BILIRUBINUR NEGATIVE 06/22/2023 1010   KETONESUR NEGATIVE 06/22/2023 1010   PROTEINUR 30 (A) 06/22/2023 1010   UROBILINOGEN 0.2 04/16/2008 0202   NITRITE NEGATIVE 06/22/2023 1010   LEUKOCYTESUR SMALL (A) 06/22/2023 1010   Sepsis Labs: @LABRCNTIP (procalcitonin:4,lacticidven:4) Microbiology: Recent Results (from the past 240 hours)  Aerobic Culture w Gram Stain (superficial specimen)     Status: None   Collection Time: 06/23/23  5:22 PM   Specimen: Wound  Result Value Ref Range Status   Specimen Description   Final    WOUND Performed at [facility], [address], [place], [place] [address]    Special Requests   Final    NONE Performed at [facility], [address] [place], [place] [address]    Gram Stain   Final    RARE WBC PRESENT, PREDOMINANTLY PMN RARE GRAM POSITIVE RODS    Culture   Final    ABUNDANT ENTEROCOCCUS FAECALIS MODERATE CORYNEBACTERIUM STRIATUM Standardized susceptibility testing for this organism is not available. Performed at [facility], [address] [place], [place] [address]    Report Status 06/28/2023 FINAL  Final   Organism ID, Bacteria ENTEROCOCCUS FAECALIS  Final      Susceptibility   Enterococcus faecalis - MIC*    AMPICILLIN  <=2 SENSITIVE Sensitive     VANCOMYCIN  1 SENSITIVE Sensitive     GENTAMICIN SYNERGY SENSITIVE Sensitive     * ABUNDANT ENTEROCOCCUS FAECALIS      Radiology Studies last 3 days: No results found.      Zhane Donlan, DO [facility] 07/01/2023, 5:46 PM    Dictation software may have been used to generate the above note. Typos may occur and escape review in typed/dictated notes. Please contact Dr Authur Leghorn  directly for clarity if needed.  Staff may message me via secure chat in Epic  but this may not receive an immediate response,  please page me for urgent matters!  If 7PM-7AM, please contact night coverage [email or web address]

## 2023-07-01 NOTE — Plan of Care (Signed)
  Problem: Coping: Goal: Ability to adjust to condition or change in health will improve Outcome: Progressing   Problem: Nutritional: Goal: Maintenance of adequate nutrition will improve Outcome: Progressing   Problem: Nutrition: Goal: Adequate nutrition will be maintained Outcome: Progressing   Problem: Coping: Goal: Level of anxiety will decrease Outcome: Progressing   Problem: Pain Managment: Goal: General experience of comfort will improve and/or be controlled Outcome: Progressing   Problem: Safety: Goal: Ability to remain free from injury will improve Outcome: Progressing

## 2023-07-01 NOTE — Inpatient Diabetes Management (Signed)
 Inpatient Diabetes Program Recommendations  AACE/ADA: New Consensus Statement on Inpatient Glycemic Control (2015)  Target Ranges:  Prepandial:   less than 140 mg/dL      Peak postprandial:   less than 180 mg/dL (1-2 hours)      Critically ill patients:  140 - 180 mg/dL   Lab Results  Component Value Date   GLUCAP 232 (H) 07/01/2023   HGBA1C 7.8 (A) 02/02/2023    Review of Glycemic Control  Diabetes history: DM2 Outpatient Diabetes medications: Glipizide  2.5 mg QAM, Metformin  1500 mg QAM  Current orders for Inpatient glycemic control: novolog  0-15 units 4 times daily, Osmolite @ 65 ml/hr   Inpatient Diabetes Program Recommendations:     Might consider:   Novolog  0-15 units Q4H.  Thank you, Hays Lipschutz, MSN, CDCES Diabetes Coordinator Inpatient Diabetes Program [phone] (team pager from 8a-5p)

## 2023-07-01 NOTE — Progress Notes (Addendum)
 Physical Therapy Treatment Patient Details Name: Billy Franco MRN: [id number] DOB: Jun 18, 1940 Today's Date: 07/01/2023   History of Present Illness Pt is an 83 y.o. male presenting to hospital with N/V and dehydration; pt also with non-healing sacral wound.  Recent hospitalization s/p fall and s/p C3-5 ACDF 05/11/23 (pt with central cord syndrome).  Pt admitted with infected stage IV sacral ulcer, chronic osteomyelitis, worsening dysphagia, intractable N/V, unintentional weight loss, FTT, and chronic urinary retention.  PMH includes DM, CKD, htn, HLD, CHF, CAD, a-fib, chronic back pain, C3-5 ACDF.    PT Comments  PT/OT co-treatr 2/2 to pt's limited activity tolerance and need for +2 assistance for safety. PT is much more alert and conversational than observed earlier in the week. Author questions if this was due to pain medications given that day and not received this one.  He still continues to require total assistance (+2) to safely achieve EOB sitting and then to return to supine from EOB. BP start of session 112/41 (60),BP sitting EOB 125/94 (99),BP end of session 118/69 (76). Pt needs constant assistance in sitting to maintain sitting balance. Unsafe to attempt transfers. Cervical collar donned throughout. Dc recs remain appropriate to maximize independence  while decreasing caregiver burden.     If plan is discharge home, recommend the following: Two people to help with walking and/or transfers;Two people to help with bathing/dressing/bathroom;Assistance with cooking/housework;Assistance with feeding;Assist for transportation;Help with stairs or ramp for entrance;Supervision due to cognitive status     Equipment Recommendations  Other (comment) (Defer to next level of care)  hoyer lift, hospital bed,  and w/c + cushion if not going to rehab/LTC facility       Precautions / Restrictions Precautions Precautions: Fall;Cervical Precaution Booklet Issued: No Recall of Precautions/Restrictions:  Impaired Precaution/Restrictions Comments: Hard collar when OOB (apply in sitting) Required Braces or Orthoses: Cervical Brace Cervical Brace: Hard collar Restrictions Weight Bearing Restrictions Per Provider Order: No Other Position/Activity Restrictions: PEG tube, sacral wound     Mobility  Bed Mobility Overal bed mobility: Needs Assistance Bed Mobility: Supine to Sit, Sit to Supine, Rolling Rolling: Max assist, +2 for physical assistance, Used rails Sidelying to sit: Max assist, +2 for physical assistance, HOB elevated, Total assist Supine to sit: Total assist, HOB elevated, +2 for physical assistance, Max assist Sit to supine: Total assist, +2 for physical assistance Sit to sidelying: Max assist, +2 for physical assistance, HOB elevated General bed mobility comments: Pt continues to require +2 assist for all mobility    Transfers  General transfer comment: not tested, unsafe to even attempt at this time. would only be safe to attempt mechanical lift transfers      Balance Overall balance assessment: Needs assistance Sitting-balance support: Single extremity supported, Feet supported, Bilateral upper extremity supported Sitting balance-Leahy Scale: Poor Sitting balance - Comments: pt needs constant assistance to prevent LOB. Even with multiple pillows stacked up under pt's R arm, continues to have severe R lateral lean Postural control: Posterior lean, Right lateral lean     Communication Communication Communication: No apparent difficulties  Cognition Arousal: Alert Behavior During Therapy: WFL for tasks assessed/performed   PT - Cognitive impairments: Awareness, Attention, Safety/Judgement    PT - Cognition Comments: Pt was much more alert and cognitively sharp compared to previous session. Author feels its due to not having any pain medications recently. Pt doe showever have poor insight of situation and actual abilities. did state he wants to try walking however remains  far form being even  safe to attempt transfers. Following commands: Impaired Following commands impaired: Follows one step commands inconsistently    Cueing Cueing Techniques: Verbal cues, Tactile cues, Visual cues     General Comments General comments (skin integrity, edema, etc.): BLE ace wraps applied to BLEs prior to EOB sitting      Pertinent Vitals/Pain Pain Assessment Faces Pain Scale: Hurts a little bit Pain Location: back and BLE pain Pain Descriptors / Indicators: Discomfort, Aching     PT Goals (current goals can now be found in the care plan section) Acute Rehab PT Goals Patient Stated Goal: none stated Progress towards PT goals: Progressing toward goals    Frequency    Min 2X/week           Co-evaluation   Reason for Co-Treatment: Necessary to address cognition/behavior during functional activity;For patient/therapist safety PT goals addressed during session: Mobility/safety with mobility;Balance;Proper use of DME;Strengthening/ROM OT goals addressed during session: ADL's and self-care      AM-PAC PT 6 Clicks Mobility   Outcome Measure  Help needed turning from your back to your side while in a flat bed without using bedrails?: Total Help needed moving from lying on your back to sitting on the side of a flat bed without using bedrails?: Total Help needed moving to and from a bed to a chair (including a wheelchair)?: Total Help needed standing up from a chair using your arms (e.g., wheelchair or bedside chair)?: Total Help needed to walk in hospital room?: Total Help needed climbing 3-5 steps with a railing? : Total 6 Click Score: 6    End of Session Equipment Utilized During Treatment: Cervical collar Activity Tolerance: Patient tolerated treatment well Patient left: in bed;with call bell/phone within reach;with bed alarm set;with family/visitor present;Other (comment) Nurse Communication: Mobility status;Precautions PT Visit Diagnosis: Other  abnormalities of gait and mobility (R26.89);History of falling (Z91.81);Other symptoms and signs involving the nervous system (R29.898);Pain     Time: 9811-9147 PT Time Calculation (min) (ACUTE ONLY): 43 min  Charges:    $Therapeutic Activity: 8-22 mins PT General Charges $$ ACUTE PT VISIT: 1 Visit                     Chester Costa PTA 07/01/23, 5:42 PM

## 2023-07-01 NOTE — TOC Progression Note (Signed)
 Transition of Care [facility]) - Progression Note    Patient Details  Name: Billy Franco MRN: [id number] Date of Birth: 11-13-1940  [facility] CM/SW Contact  Loman Risk, RN Phone Number: 07/01/2023, 10:43 AM  Clinical Narrative:     Oncoming MD aware that patient will require auth for SNF. MD to notify [facility] if patient appropriate for auth to be initiated        Expected Discharge Plan and Services                                               Social Determinants of Health (SDOH) Interventions SDOH Screenings   Food Insecurity: No Food Insecurity (06/22/2023)  Housing: Low Risk  (06/22/2023)  Transportation Needs: No Transportation Needs (06/22/2023)  Utilities: Not At Risk (06/22/2023)  Alcohol Screen: Low Risk  (01/24/2019)  Depression (PHQ2-9): Low Risk  (02/02/2023)  Financial Resource Strain: Low Risk  (01/24/2019)  Physical Activity: Sufficiently Active (01/24/2019)  Social Connections: Socially Integrated (06/22/2023)  Stress: No Stress Concern Present (01/24/2019)  Tobacco Use: Low Risk  (06/22/2023)    Readmission Risk Interventions     No data to display

## 2023-07-02 DIAGNOSIS — Z7189 Other specified counseling: Secondary | ICD-10-CM | POA: Diagnosis not present

## 2023-07-02 LAB — GLUCOSE, CAPILLARY
Glucose-Capillary: 311 mg/dL — ABNORMAL HIGH (ref 70–99)
Glucose-Capillary: 157 mg/dL — ABNORMAL HIGH (ref 70–99)
Glucose-Capillary: 173 mg/dL — ABNORMAL HIGH (ref 70–99)
Glucose-Capillary: 226 mg/dL — ABNORMAL HIGH (ref 70–99)
Glucose-Capillary: 147 mg/dL — ABNORMAL HIGH (ref 70–99)
Glucose-Capillary: 91 mg/dL (ref 70–99)

## 2023-07-02 LAB — CBC
HCT: 26.4 % — ABNORMAL LOW (ref 39.0–52.0)
Hemoglobin: 8.2 g/dL — ABNORMAL LOW (ref 13.0–17.0)
MCH: 30.5 pg (ref 26.0–34.0)
MCHC: 31.1 g/dL (ref 30.0–36.0)
MCV: 98.1 fL (ref 80.0–100.0)
Platelets: 351 10*3/uL (ref 150–400)
RBC: 2.69 MIL/uL — ABNORMAL LOW (ref 4.22–5.81)
RDW: 14.6 % (ref 11.5–15.5)
WBC: 8.6 10*3/uL (ref 4.0–10.5)
nRBC: 0 % (ref 0.0–0.2)

## 2023-07-02 LAB — PHOSPHORUS: Phosphorus: 2.1 mg/dL — ABNORMAL LOW (ref 2.5–4.6)

## 2023-07-02 LAB — BASIC METABOLIC PANEL WITH GFR
Anion gap: 7 (ref 5–15)
BUN: 22 mg/dL (ref 8–23)
CO2: 29 mmol/L (ref 22–32)
Calcium: 8.3 mg/dL — ABNORMAL LOW (ref 8.9–10.3)
Chloride: 111 mmol/L (ref 98–111)
Creatinine, Ser: 0.57 mg/dL — ABNORMAL LOW (ref 0.61–1.24)
GFR, Estimated: >60 mL/min (ref >60)
Glucose, Bld: 272 mg/dL — ABNORMAL HIGH (ref 70–99)
Potassium: 4.1 mmol/L (ref 3.5–5.1)
Sodium: 147 mmol/L — ABNORMAL HIGH (ref 135–145)

## 2023-07-02 LAB — MAGNESIUM: Magnesium: 2 mg/dL (ref 1.7–2.4)

## 2023-07-02 MED ORDER — INSULIN ASPART 100 UNIT/ML IJ SOLN
15.0000 [IU] | Freq: Once | INTRAMUSCULAR | Status: AC
  Administered 2023-07-02: 15 [IU] via SUBCUTANEOUS
  Filled 2023-07-02: qty 1

## 2023-07-02 MED ORDER — INSULIN GLARGINE-YFGN 100 UNIT/ML ~~LOC~~ SOLN
5.0000 [IU] | Freq: Every day | SUBCUTANEOUS | Status: DC
  Administered 2023-07-02 – 2023-07-06 (×5): 5 [IU] via SUBCUTANEOUS
  Filled 2023-07-02 (×6): qty 0.05

## 2023-07-02 MED ORDER — INSULIN ASPART 100 UNIT/ML IJ SOLN: 5.0000 [IU] | INTRAMUSCULAR | Status: DC

## 2023-07-02 MED ORDER — INSULIN ASPART 100 UNIT/ML IJ SOLN
5.0000 [IU] | Freq: Four times a day (QID) | INTRAMUSCULAR | Status: DC
  Administered 2023-07-02 – 2023-07-07 (×18): 5 [IU] via SUBCUTANEOUS
  Filled 2023-07-02 (×20): qty 1

## 2023-07-02 MED ORDER — FREE WATER
100.0000 mL | Status: DC
  Administered 2023-07-02 – 2023-07-03 (×6): 100 mL

## 2023-07-02 NOTE — Inpatient Diabetes Management (Signed)
 Inpatient Diabetes Program Recommendations  AACE/ADA: New Consensus Statement on Inpatient Glycemic Control (2015)  Target Ranges:  Prepandial:   less than 140 mg/dL      Peak postprandial:   less than 180 mg/dL (1-2 hours)      Critically ill patients:  140 - 180 mg/dL    Latest Reference Range & Units 06/30/23 23:56 07/01/23 06:03 07/01/23 14:04 07/01/23 18:45 07/01/23 21:45  Glucose-Capillary 70 - 99 mg/dL 578 (H) 469 (H)  3 units Novolog  @0859  173 (H)  3 units Novolog   219 (H)  5 units Novolog   164 (H)  3 units Novolog    (H): Data is abnormally high  Latest Reference Range & Units 07/01/23 23:41 07/02/23 06:14  Glucose-Capillary 70 - 99 mg/dL 629 (H) 528 (H)  15 units Novolog    (H): Data is abnormally high     Home DM Meds: Glipizide  2.5 mg daily        Metformin  1500 mg daily  Current Orders: Novolog  Moderate Correction Scale/ SSI (0-15 units) 4 times daily (10am, 2pm, 6pm, 10pm)     MD- Note pt getting continuous tube feeds 65cc/hr  Please consider adjusting the Novolog  SSi to Q4 hour coverage    --Will follow patient during hospitalization--  Langston Pippins RN, MSN, CDCES Diabetes Coordinator Inpatient Glycemic Control Team Team Pager: [phone] (8a-5p)

## 2023-07-02 NOTE — Progress Notes (Signed)
 Nutrition Follow Up Note   DOCUMENTATION CODES:   Severe malnutrition in context of acute illness/injury  INTERVENTION:   Continue Osmolite 1.5@65ml /hr   Increase free water  flushes to 100ml q4 hours   Regimen provides 2340kcal/day, 98g/day protein and 1771ml/day of free water .   Pt remains at refeed risk; recommend monitor potassium, magnesium  and phosphorus labs daily until stable  Juven Fruit Punch BID via tube, each serving provides 95kcal and 2.5g of protein (amino acids glutamine and arginine)  Vitamin C  500mg  BID via tube  Daily weights   NUTRITION DIAGNOSIS:   Severe Malnutrition related to acute illness as evidenced by severe fat depletion, severe muscle depletion, 11 percent weight loss in 5 months. -ongoing   GOAL:   Patient will meet greater than or equal to 90% of their needs -met   MONITOR:   Labs, Weight trends, I & O's, Skin, Diet advancement, TF tolerance  ASSESSMENT:   83 y/o male with h/o GERD, chronic back pain, DM, HTN, Afib, RLS, CAD s/p PCI and stent, CKD III, CHF with EF 25-30%, refused AICD, ischemic cardiomyopathy, gout and recent admission for cervical stenosis and myelopathy after fall s/p C3-5 ACDF 4/21 complicated by acute metabolic encephalopathy with acute hypoxic respiratory failure due to presumed aspiration requiring intubation and mechanical ventilation for airway protection and who is now admitted for FTT (with dysphagia, nausea and vomiting) and osteomyeltitis sacral wound.  Pt tolerating tube feeds at goal rate. Pt noted to have some bleeding from around the G-tube site a couple of days ago but no further bleeding noted. Refeed labs improving. Pt with mild hypernatremia today; will increase free water . Hyperglycemia improving; diabetes coordinator following. Diarrhea improving. Per chart, pt appears weight stable since admit. Plan is for SNF at discharge.   Medications reviewed and include: vitamin C , aspirin , lovenox , insulin ,  midodrine, juven, unasyn   Labs reviewed: Na 147(H), K 4.1 wnl, creat 0.57(L), P 2.1(L), Mg 2.0 wnl Hgb 8.2(L), Hct 26.4(L) Cbgs- 157, 311 x 24 hrs   Diet Order:   Diet Order             Diet NPO time specified Except for: Sips with Meds  Diet effective midnight                  EDUCATION NEEDS:   Education needs have been addressed  Skin:  Skin Assessment: Reviewed RN Assessment  -Sacrum with chronic Stage 4 pressure injury -Left buttock wound is Unstageable -Left hip is Unstageable -Left heel Unstageable  -Right heel Unstageable   Last BM:  6/9- type 6  Height:   Ht Readings from Last 1 Encounters:  06/26/23 5' 7 (1.702 m)    Weight:   Wt Readings from Last 1 Encounters:  07/02/23 66 kg    Ideal Body Weight:  67.2 kg  BMI:  Body mass index is 22.79 kg/m.  Estimated Nutritional Needs:   Kcal:  1900-2200kcal/day  Protein:  95-110g/day  Fluid:  1.6-1.8L/day  Torrance Freestone MS, RD, LDN If unable to be reached, please send secure chat to RD inpatient available from 8:00a-4:00p daily

## 2023-07-02 NOTE — Progress Notes (Signed)
 PROGRESS NOTE    Billy Franco   RUE:[id number] DOB: [birth year]  DOA: 06/22/2023 Date of Service: 07/02/23 which is hospital day 10  PCP: Arleen Lacer, MD    Hospital course / significant events:   Tahjir Franco is a 83 y.o. 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 Upon SLP, barium swallow study- patient has severe pharyngeal musculature weakness and high risk of aspiration - PEG placed here.   Orthostatic hypotension impacting PT participation and was on multiple antihypertensives and midodrine simultaneously at home. Holding all anti-HTN meds he is on borderline lower limits of normal BP and was able to participate with PT.   Complicated GOC discussions w/ patient, his wife, palliative care team, hospitalist team. See IPAL note and progress note 07/02/23     Consultants:  General surgery Neurosurgery  Infectious disease Interventional radiology  Palliative care   Procedures/Surgeries: 6/6 G tube placement       ASSESSMENT & PLAN:   Bleed from Gtube - resolved  Per RN report, no active bleeding noted but pt's gown was soaked bright red blood HH stable No blood in tube itself - on exam no bleeding Monitor through today into tomorrow CBC  Stage IV sacral ulcer  sacral osteomyelitis wound care Continue current antibiotics Infectious disease on board - recommends 6 weeks of antibiotics PT OT has recommended acute rehab placement, family is considering for home potentially w/ hospice    Orthostatic  hypotension Midodrine    Neurogenic Dysphagia, worsening Patient underwent PEG tube placement on 06/26/2023 Continue tube feeding Continue to monitor electrolytes to prevent refeeding syndrome Speech therapy and dietitian on board Palliative care on board Patient has been inconsistent in expressing his wishes re feeding tube, at one point I don't want to live on this forever then whatever it takes to keep me here. See below re: capacity assessment but he does not seem to understand consequences of options re: leave PEG or remove it, he does not seem to understand that his swallowing function is not going to improve    Hyperkalemia Continue to monitor potassium closely   Unintentional weight loss  Failure to thrive Severe malnutrition in setting of malnutrition, dysphagia treat as above PT/OT, RD   Anemia of chronic disease baseline hgb around 10.5 Continue to monitor CBC   DM2 Continue SSI and CBG monitoring  Increased novolog   Added Semglee  today  Titrate as needed    Chronic urinary retention No acute concern   CKD stage IIIb Euvolemic, creatinine level stable Monitor BMP   Chronic HFrEF Euvolemic Continue home BP/CHF medications   PAF In sinus rhythm Not on anticoagulation due to history of gross hematuria   Goals of Care / Advanced Care Planning  See IPAL note Briefly, patient cannot voice clear understanding of his medical situation (particularly that the swallowing is not going to improve), his options and potential/likely outcomes of those options (leave PEG in place and expect this may prolong life and he has no way to receive nutrition without it, vs remove it and recognize this will eventually result in starvation and subsequent organ failure and death).  He has some capacity to voice his goals but he is not consistent with those goals and he cannot elucidate his reasoning which would support his choice. He does not have full decision-making capacity at this  time. Complex medical decisions will be up to patient's wife, which I communicated to her today.  Confounding the issue is the fact he seems avoidant to discussing death whatsoever, he seems avoidant to discussing very much with women including myself and palliative care NP, wife thinks he might respond more to male pastor.    No concerns based on BMI: Body mass index is 22.79 kg/m.Billy Franco Significantly low or high BMI is associated with higher medical risk. However he is still malnourished  Underweight - under 18  overweight - 25 to 29 obese - 30 or more Class 1 obesity: BMI of 30.0 to 34 Class 2 obesity: BMI of 35.0 to 39 Class 3 obesity: BMI of 40.0 to 49 Super Morbid Obesity: BMI 50-59 Super-super Morbid Obesity: BMI 60+ Healthy nutrition and physical activity advised as adjunct to other disease management and risk reduction treatments    DVT prophylaxis: lovenox  IV fluids: NS continuous IV fluids  Nutrition: tube feeds Central lines / other devices: G tube  Code Status: FULL CODE ACP documentation reviewed:  none on file in [facility]  TOC needs: placement  Medical barriers to dispo: bleeding yesterday from G tube. Expected medical readiness for discharge possible today however goals of care discussions are in process, getting Glc under control.              Subjective / Brief ROS:  Patient reports no concerns  He states initially he wants the feeding tube if it will help him live, later states he wants it out. He cannot or will not repeat back to me the options for feeding tube remain vs remove and what the outcome would be for either of those decisions  Denies CP/SOB.  Denies new weakness.  Tolerating tube feeds.   Family Communication: wife at bediside on rounds and she and I discussed his care separately as well see IPAL note     Objective Findings:  Vitals:   07/01/23 1951 07/02/23 0322 07/02/23 0806 07/02/23 1519  BP: (!) 137/59 (!) 103/52 133/71 (!) 113/52   Pulse: 70 79 79 84  Resp: 17 18 16 12   Temp: 98.7 F (37.1 C) 97.8 F (36.6 C) 98.3 F (36.8 C) 97.9 F (36.6 C)  TempSrc: Oral Oral Oral Oral  SpO2: 100% 100% 100% 100%  Weight:  66 kg    Height:        Intake/Output Summary (Last 24 hours) at 07/02/2023 1608 Last data filed at 07/02/2023 1100 Gross per 24 hour  Intake --  Output 1575 ml  Net -1575 ml   Filed Weights   06/26/23 1339 06/30/23 0500 07/02/23 0322  Weight: 63 kg 66 kg 66 kg    Examination:  Physical Exam Constitutional:      General: He is not in acute distress.    Appearance: He is ill-appearing (thin, frail).   Cardiovascular:     Rate and Rhythm: Normal rate and regular rhythm.  Pulmonary:     Effort: Pulmonary effort is normal.     Breath sounds: Normal breath sounds.  Abdominal:     General: Abdomen is flat.     Palpations: Abdomen is soft.     Comments: G tube in place, no active bleedin from site or around it, suture in place  Skin:    General: Skin is warm and dry.   Neurological:     Mental Status: He is alert. Mental status is at baseline.   Psychiatric:        Mood and Affect: Mood normal.        Behavior: Behavior normal.          Scheduled Medications:   vitamin C   500 mg Per Tube BID   aspirin   81 mg Per Tube Daily   Chlorhexidine  Gluconate Cloth  6 each Topical Q0600   dextrose   50 mL Intravenous Once   enoxaparin  (LOVENOX ) injection  40 mg Subcutaneous Q24H   free water   100 mL Per Tube Q4H   insulin  aspart  0-15 Units Subcutaneous QID   insulin  aspart  5 Units Subcutaneous QID   insulin  glargine-yfgn  5 Units Subcutaneous QHS   leptospermum manuka honey  1 Application Topical Daily   midodrine  10 mg Oral TID WC   nutrition supplement (JUVEN)  1 packet Per Tube BID BM   sodium chloride  flush  10-40 mL Intracatheter Q12H    Continuous Infusions:  ampicillin -sulbactam (UNASYN ) IV 3 g (07/02/23 1203)   feeding supplement (OSMOLITE 1.5 CAL) 65 mL/hr at 06/30/23  1802    PRN Medications:  acetaminophen  **OR** acetaminophen , bisacodyl , HYDROmorphone  (DILAUDID ) injection, ondansetron  **OR** ondansetron  (ZOFRAN ) IV, mouth rinse, polyethylene glycol, sodium chloride , sodium chloride  flush  Antimicrobials from admission:  Anti-infectives (From admission, onward)    Start     Dose/Rate Route Frequency Ordered Stop   06/27/23 1400  vancomycin  (VANCOCIN ) IVPB 1000 mg/200 mL premix  Status:  Discontinued        1,000 mg 200 mL/hr over 60 Minutes Intravenous Every 24 hours 06/26/23 1322 06/28/23 1043   06/26/23 1700  Ampicillin -Sulbactam (UNASYN ) 3 g in sodium chloride  0.9 % 100 mL IVPB        3 g 200 mL/hr over 30 Minutes Intravenous Every 6 hours 06/26/23 1558     06/26/23 0000  ceFAZolin  (ANCEF ) IVPB 2g/100 mL premix        2 g 200 mL/hr over 30 Minutes Intravenous To Radiology 06/24/23 1010 06/26/23 0348   06/23/23 1800  ceFEPIme  (MAXIPIME ) 2 g in sodium chloride  0.9 % 100 mL IVPB  Status:  Discontinued        2 g 200 mL/hr over 30 Minutes Intravenous Every 8 hours 06/23/23 1110 06/26/23 1558   06/23/23 1200  vancomycin  (VANCOREADY) IVPB 1250 mg/250 mL  Status:  Discontinued        1,250 mg 166.7 mL/hr over 90 Minutes Intravenous Every 24 hours 06/22/23 1608 06/23/23 1113   06/23/23 1200  vancomycin  (VANCOREADY) IVPB 1500 mg/300 mL  Status:  Discontinued        1,500 mg 150 mL/hr over 120 Minutes Intravenous Every 24 hours 06/23/23 1113 06/26/23 1322   06/22/23 2200  ceFEPIme  (MAXIPIME ) 2 g in sodium chloride  0.9 % 100 mL IVPB  Status:  Discontinued        2 g 200 mL/hr over 30 Minutes Intravenous Every 12 hours 06/22/23 1608 06/23/23 1110   06/22/23 1200  ceFEPIme  (MAXIPIME ) 2 g in sodium chloride  0.9 % 100 mL IVPB        2 g 200 mL/hr over 30 Minutes Intravenous  Once 06/22/23 1158 06/22/23 1302   06/22/23 1200  vancomycin  (VANCOREADY) IVPB 1500 mg/300 mL        1,500 mg 150 mL/hr over 120 Minutes Intravenous  Once 06/22/23 1158  06/22/23 1519            Data Reviewed:  I have personally reviewed the following...  CBC: Recent Labs  Lab 06/27/23 0548 06/28/23 0441 06/29/23 0419 06/30/23 0424 07/01/23 0405 07/02/23 0357  WBC 10.0 9.8 8.7 10.4 8.5 8.6  NEUTROABS 8.0* 7.4 6.3 8.1* 6.0  --   HGB 9.7* 8.8* 9.0* 10.3* 8.2* 8.2*  HCT 29.8* 26.2* 27.7* 32.0* 26.4* 26.4*  MCV 92.5 92.6 93.9 94.4 97.1 98.1  PLT 342 320 340 378 341 351   Basic Metabolic Panel: Recent Labs  Lab 06/28/23 0441 06/29/23 0419 06/30/23 0424 06/30/23 1833 07/01/23 0405 07/02/23 0357  NA 138 140 140  --  143 147*  K 3.7 3.7 6.0* 4.6 4.1 4.1  CL 110 108 105  --  108 111  CO2 22 26 24   --  31 29  GLUCOSE 235* 296* 310*  --  208* 272*  BUN 28* 18 16  --  29* 22  CREATININE 0.62 0.59* 0.76  --  0.66 0.57*  CALCIUM  8.5* 8.4* 8.7*  --  8.4* 8.3*  MG 1.7 1.6* 2.0  --  2.0 2.0  PHOS 2.5 2.0* 2.7  --  2.6 2.1*   GFR: Estimated Creatinine Clearance: 66.5 mL/min (A) (by C-G formula based on SCr of 0.57 mg/dL (L)). Liver Function Tests: No results for input(s): AST, ALT, ALKPHOS, BILITOT, PROT, ALBUMIN in the last 168 hours.  No results for input(s): LIPASE, AMYLASE in the last 168 hours. No results for input(s): AMMONIA in the last 168 hours. Coagulation Profile: Recent Labs  Lab 06/25/23 2340  INR 1.3*   Cardiac Enzymes: No results for input(s): CKTOTAL, CKMB, CKMBINDEX, TROPONINI in the last 168 hours. BNP (last 3 results) No results for input(s): PROBNP in the last 8760 hours. HbA1C: No results for input(s): HGBA1C in the last 72 hours. CBG: Recent Labs  Lab 07/01/23 2145 07/01/23 2341 07/02/23 0614 07/02/23 1052 07/02/23 1350  GLUCAP 164* 172* 311* 157* 173*   Lipid Profile: No results for input(s): CHOL, HDL, LDLCALC, TRIG, CHOLHDL, LDLDIRECT in the last 72 hours. Thyroid  Function Tests: No results for input(s): TSH, T4TOTAL, FREET4, T3FREE, THYROIDAB in the last 72  hours. Anemia Panel: No results for input(s): VITAMINB12, FOLATE, FERRITIN, TIBC, IRON, RETICCTPCT in the last 72 hours. Most Recent Urinalysis On File:     Component Value Date/Time   COLORURINE YELLOW 06/22/2023 1010   APPEARANCEUR CLEAR 06/22/2023 1010   LABSPEC 1.020 06/22/2023 1010   PHURINE 5.5 06/22/2023 1010   GLUCOSEU 250 (A) 06/22/2023 1010   HGBUR MODERATE (A) 06/22/2023 1010   BILIRUBINUR NEGATIVE 06/22/2023 1010   KETONESUR NEGATIVE 06/22/2023 1010   PROTEINUR 30 (A) 06/22/2023 1010   UROBILINOGEN 0.2 04/16/2008 0202   NITRITE NEGATIVE 06/22/2023 1010   LEUKOCYTESUR SMALL (A) 06/22/2023 1010   Sepsis Labs: @LABRCNTIP (procalcitonin:4,lacticidven:4) Microbiology: Recent Results (from the past 240 hours)  Aerobic Culture w Gram Stain (superficial specimen)     Status: None   Collection Time: 06/23/23  5:22 PM   Specimen: Wound  Result Value Ref Range Status   Specimen Description   Final    WOUND Performed at [facility], [address], [place], [place] [address]    Special Requests   Final    NONE Performed at [facility], [address] [place], [place] [address]    Gram Stain   Final    RARE WBC PRESENT, PREDOMINANTLY PMN RARE GRAM POSITIVE RODS  Culture   Final    ABUNDANT ENTEROCOCCUS FAECALIS MODERATE CORYNEBACTERIUM STRIATUM Standardized susceptibility testing for this organism is not available. Performed at [facility], [address], [place], [place] [address]    Report Status 06/28/2023 FINAL  Final   Organism ID, Bacteria ENTEROCOCCUS FAECALIS  Final      Susceptibility   Enterococcus faecalis - MIC*    AMPICILLIN  <=2 SENSITIVE Sensitive     VANCOMYCIN  1 SENSITIVE Sensitive     GENTAMICIN SYNERGY SENSITIVE Sensitive     * ABUNDANT ENTEROCOCCUS FAECALIS      Radiology Studies last 3 days: No results found.      Celeste Tavenner, DO [facility] 07/02/2023, 4:08 PM    Dictation  software may have been used to generate the above note. Typos may occur and escape review in typed/dictated notes. Please contact Dr Authur Leghorn directly for clarity if needed.  Staff may message me via secure chat in Epic  but this may not receive an immediate response,  please page me for urgent matters!  If 7PM-7AM, please contact night coverage [email or web address]
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
## 2023-07-02 NOTE — IPAL (Signed)
  Interdisciplinary Goals of Care Family Meeting   Date carried out: 07/02/2023  Location of the meeting: Bedside  Member's involved: Physician, Family Member or next of kin, and Other: palliative care for beginning of conversation, I later spoke w/ wife alone after more detailed assessment of capacity   Durable Power of Attorney or Environmental health practitioner: patient's wife    Discussion: We discussed goals of care for Billy Franco   See palliative care note for today as well for summary of that discussion.   Patient has been inconsistent in expressing his wishes re feeding tube, at one point I don't want to live on this forever then whatever it takes to keep me here. He does not seem to understand consequences of options re: leave PEG or remove it, he does not seem to understand or he will not admit that his swallowing function is not going to improve. Wife states he is hopeful for recovery if we stay the course, but she is not sure he would truly want aggressive and/or prolonged invasive treatments.   I laid out options as I see it, given that patient has limited decision-making capacity as he is not demonstrating understanding of his options and he is not communicating his reasoning to us , his wife will be the acting decision-maker which I communicated to her today.   1) continue full scope aggressive care including PEG, IV abx, labs, tests, hospitalization as needed, rehab as able. Even with full scope, anticipate he will continue to decline given how frail he has become and limited strength, especially if refusing to go to rehab  2) home with hospice or home health services, can do this under comfort measures only plan (see below) or can try to prolong life with treatment as able with IV abx and tube feeds. Even with these measures, anticipate he will continue to decline given how frail he has become and limited strength if refusing rehab. Plan should be made for if/when he is  actively dying and/or if family is not able to provide safe adequate care. May involve hospice or readmission to hospital.   3) comfort measures only to include removing PEG, stopping any treatments not aimed at comfort/dignity, goal for home w/ hospice and eventual transition to hospice facility if need for symptom control, but either way anticipate nature takes its course and patient will succumb to his illnesses.   Patient's wife would like to discuss options with family and plan for family meeting with me and her children this weekend    Code status:   Code Status: Full Code   Disposition: Continue current acute care  Time spent for the meeting: 30 min     Melodi Sprung, DO  07/02/2023, 4:10 PM

## 2023-07-03 DIAGNOSIS — Z7189 Other specified counseling: Secondary | ICD-10-CM | POA: Diagnosis not present

## 2023-07-03 LAB — BASIC METABOLIC PANEL WITH GFR
Anion gap: 8 (ref 5–15)
BUN: 22 mg/dL (ref 8–23)
CO2: 28 mmol/L (ref 22–32)
Calcium: 8.5 mg/dL — ABNORMAL LOW (ref 8.9–10.3)
Chloride: 112 mmol/L — ABNORMAL HIGH (ref 98–111)
Creatinine, Ser: 0.53 mg/dL — ABNORMAL LOW (ref 0.61–1.24)
GFR, Estimated: >60 mL/min (ref >60)
Glucose, Bld: 93 mg/dL (ref 70–99)
Potassium: 4.2 mmol/L (ref 3.5–5.1)
Sodium: 148 mmol/L — ABNORMAL HIGH (ref 135–145)

## 2023-07-03 LAB — GLUCOSE, CAPILLARY
Glucose-Capillary: 105 mg/dL — ABNORMAL HIGH (ref 70–99)
Glucose-Capillary: 123 mg/dL — ABNORMAL HIGH (ref 70–99)
Glucose-Capillary: 169 mg/dL — ABNORMAL HIGH (ref 70–99)
Glucose-Capillary: 203 mg/dL — ABNORMAL HIGH (ref 70–99)
Glucose-Capillary: 126 mg/dL — ABNORMAL HIGH (ref 70–99)

## 2023-07-03 LAB — PHOSPHORUS: Phosphorus: 2.8 mg/dL (ref 2.5–4.6)

## 2023-07-03 LAB — CBC
HCT: 27.5 % — ABNORMAL LOW (ref 39.0–52.0)
Hemoglobin: 8.3 g/dL — ABNORMAL LOW (ref 13.0–17.0)
MCH: 29.6 pg (ref 26.0–34.0)
MCHC: 30.2 g/dL (ref 30.0–36.0)
MCV: 98.2 fL (ref 80.0–100.0)
Platelets: 373 10*3/uL (ref 150–400)
RBC: 2.8 MIL/uL — ABNORMAL LOW (ref 4.22–5.81)
RDW: 14.5 % (ref 11.5–15.5)
WBC: 8.4 10*3/uL (ref 4.0–10.5)
nRBC: 0 % (ref 0.0–0.2)

## 2023-07-03 LAB — MAGNESIUM: Magnesium: 1.9 mg/dL (ref 1.7–2.4)

## 2023-07-03 MED ORDER — SODIUM CHLORIDE 0.9 % IV BOLUS
500.0000 mL | Freq: Once | INTRAVENOUS | Status: AC
  Administered 2023-07-03: 500 mL via INTRAVENOUS

## 2023-07-03 MED ORDER — FREE WATER
200.0000 mL | Status: DC
  Administered 2023-07-03 – 2023-07-07 (×16): 200 mL

## 2023-07-03 NOTE — Plan of Care (Signed)
   Problem: Coping: Goal: Ability to adjust to condition or change in health will improve Outcome: Progressing   Problem: Coping: Goal: Level of anxiety will decrease Outcome: Progressing   Problem: Pain Managment: Goal: General experience of comfort will improve and/or be controlled Outcome: Progressing   Problem: Safety: Goal: Ability to remain free from injury will improve Outcome: Progressing

## 2023-07-03 NOTE — Progress Notes (Signed)
 Physical Therapy Treatment Patient Details Name: Avon Mergenthaler MRN: [id number] DOB: 28-May-1940 Today's Date: 07/03/2023   History of Present Illness Pt is an 83 y.o. male presenting to hospital with N/V and dehydration; pt also with non-healing sacral wound.  Recent hospitalization s/p fall and s/p C3-5 ACDF 05/11/23 (pt with central cord syndrome).  Pt admitted with infected stage IV sacral ulcer, chronic osteomyelitis, worsening dysphagia, intractable N/V, unintentional weight loss, FTT, and chronic urinary retention.  PMH includes DM, CKD, htn, HLD, CHF, CAD, a-fib, chronic back pain, C3-5 ACDF.    PT Comments  Patient eager to sit up on edge of bed. He continues to require significant assistance with all mobility and to maintain sitting balance. Patient was able to sit around 8 minutes on edge of bed with no dizziness reported until towards the end. Attempted to facilitate upright trunk control and postural awareness as well as active movement of BLE while sitting. Slow progress overall. PT will continue to follow to maximize independence and decrease caregiver burden.    If plan is discharge home, recommend the following: Two people to help with walking and/or transfers;Two people to help with bathing/dressing/bathroom;Assistance with cooking/housework;Assistance with feeding;Assist for transportation;Help with stairs or ramp for entrance;Supervision due to cognitive status   Can travel by private vehicle     No  Equipment Recommendations   (to be determined)    Recommendations for Other Services       Precautions / Restrictions Precautions Precautions: Fall;Cervical Precaution Booklet Issued: No Recall of Precautions/Restrictions: Impaired Precaution/Restrictions Comments: Hard collar when OOB (apply in sitting) Required Braces or Orthoses: Cervical Brace Cervical Brace: Hard collar Restrictions Weight Bearing Restrictions Per Provider Order: No Other Position/Activity  Restrictions: PEG tube, sacral wound     Mobility  Bed Mobility Overal bed mobility: Needs Assistance Bed Mobility: Sidelying to Sit   Sidelying to sit: Total assist     Sit to sidelying: Total assist General bed mobility comments: verbal cues for task initiation, sequencing, and technique to facilitae independence with mobility.    Transfers                   General transfer comment: unable to safely try while sitting due to poor sitting balance    Ambulation/Gait                   Stairs             Wheelchair Mobility     Tilt Bed    Modified Rankin (Stroke Patients Only)       Balance Overall balance assessment: Needs assistance Sitting-balance support: Feet unsupported, Bilateral upper extremity supported, Single extremity supported Sitting balance-Leahy Scale: Zero Sitting balance - Comments: Total assistance initially, progressing to brief periods of Min A. cues for trunk activation. loss of balance posteriorly, to the right, and to the left Postural control: Posterior lean, Right lateral lean, Left lateral lean                                  Communication Communication Communication: Impaired Factors Affecting Communication: Reduced clarity of speech  Cognition Arousal: Alert Behavior During Therapy: WFL for tasks assessed/performed   PT - Cognitive impairments: Awareness, Attention, Safety/Judgement                       PT - Cognition Comments: patient is impulsive with activity and requires safety cues.  decreased awareness of deficits Following commands: Impaired Following commands impaired: Follows one step commands inconsistently    Cueing Cueing Techniques: Verbal cues, Tactile cues, Visual cues  Exercises General Exercises - Lower Extremity Ankle Circles/Pumps: AAROM, Strengthening, Both, 5 reps, Seated, PROM Long Arc Quad: AAROM, Both, 5 reps, Seated, PROM Other Exercises Other Exercises:  cues for muscle activation for LE strengthening    General Comments General comments (skin integrity, edema, etc.): patient sat edge of bed for ~ 8 minutes with mild complaint of dizziness towards end of sitting bout      Pertinent Vitals/Pain Pain Assessment Pain Assessment: Faces Faces Pain Scale: Hurts whole lot Pain Location: left leg Pain Descriptors / Indicators: Discomfort Pain Intervention(s): Limited activity within patient's tolerance, Monitored during session, Repositioned    Home Living                          Prior Function            PT Goals (current goals can now be found in the care plan section) Acute Rehab PT Goals Patient Stated Goal: none stated PT Goal Formulation: With patient Time For Goal Achievement: 07/07/23 Potential to Achieve Goals: Fair Progress towards PT goals: Progressing toward goals    Frequency    Min 2X/week      PT Plan      Co-evaluation              AM-PAC PT 6 Clicks Mobility   Outcome Measure  Help needed turning from your back to your side while in a flat bed without using bedrails?: Total Help needed moving from lying on your back to sitting on the side of a flat bed without using bedrails?: Total Help needed moving to and from a bed to a chair (including a wheelchair)?: Total Help needed standing up from a chair using your arms (e.g., wheelchair or bedside chair)?: Total Help needed to walk in hospital room?: Total Help needed climbing 3-5 steps with a railing? : Total 6 Click Score: 6    End of Session   Activity Tolerance: Patient limited by fatigue Patient left: in bed;with call bell/phone within reach;with family/visitor present Nurse Communication: Mobility status PT Visit Diagnosis: Other abnormalities of gait and mobility (R26.89);History of falling (Z91.81);Other symptoms and signs involving the nervous system (R29.898);Pain     Time: 1330-1400 PT Time Calculation (min) (ACUTE ONLY):  30 min  Charges:    $Therapeutic Activity: 23-37 mins PT General Charges $$ ACUTE PT VISIT: 1 Visit                     Ozie Bo, PT, MPT   Erlene Hawks 07/03/2023, 2:10 PM

## 2023-07-03 NOTE — TOC Progression Note (Signed)
 Transition of Care [facility]) - Progression Note    Patient Details  Name: Billy Franco MRN: [id number] Date of Birth: 02/07/1940  Transition of Care [facility]) CM/SW Contact  Loman Risk, RN Phone Number: 07/03/2023, 11:49 AM  Clinical Narrative:     Patient and wife are aware that if they decide to proceed with SNF that the only offer they have in [place] is to return to [facility].  They both decline to extend search       Expected Discharge Plan and Services                                               Social Determinants of Health (SDOH) Interventions SDOH Screenings   Food Insecurity: No Food Insecurity (06/22/2023)  Housing: Low Risk  (06/22/2023)  Transportation Needs: No Transportation Needs (06/22/2023)  Utilities: Not At Risk (06/22/2023)  Alcohol Screen: Low Risk  (01/24/2019)  Depression (PHQ2-9): Low Risk  (02/02/2023)  Financial Resource Strain: Low Risk  (01/24/2019)  Physical Activity: Sufficiently Active (01/24/2019)  Social Connections: Socially Integrated (06/22/2023)  Stress: No Stress Concern Present (01/24/2019)  Tobacco Use: Low Risk  (06/22/2023)    Readmission Risk Interventions     No data to display

## 2023-07-03 NOTE — Progress Notes (Addendum)
 [facility] LIAISON NOTE  Received request from Stafford Eagles, RN, Transitions of Care [facility]), for assessment for hospice InPatient Unit [facility]).  I met with patient and his wife at the bedside to initiate education related to hospice philosophy, services, and team approach to care.  [name] he wants to go home and die and be with his heavenly Father.  He does not want to have feeding through PEG and wants to stop IV meds and wants to eat and drink verbalizing understanding that he may choke.  He endorses acknowledgement of this and made his wishes known.  Spoke with them regarding hospice IPU and criteria for eligibility.  At this time, patient does not meet criteria for IPU.  After discussing what hospice is able to do in the home, wife says that is what they plan on doing- going home with hospice.  Billy Franco is in agreement.  Both son and daughter are currently unavailable and have not received any of the information/changes since yesterday (Thursday).  Billy Franco states Dr. Authur Franco plans on meeting with the family to explain changes in patient's medical condition. Patient/family verbalized understanding of information given. Per discussion, the plan is for discharge home by EMS once medically stable and when DME can be set up.     DME needs discussed.   Patient/family requests the following equipment for delivery:   hospital bed with air mattress, over bed table.    The address has been verified and is correct in the chart.  Billy Franco, spouse, is the family contact to arrange time of equipment delivery.  Please send signed and completed DNR home with patient/family if applicable.   Please provide prescriptions at discharge as needed to ensure ongoing symptom management.  [facility] information and contact numbers given to spouse, Billy Franco.  Above information shared with Billy Mackintosh, RN, [facility] and hospital medical care team.  Please call  with any hospice related questions or concerns.  Thank you for the opportunity to participate in this patient's care.    Billy Junker, MA, BSN, RN, FNE Nurse Liaison [phone]

## 2023-07-03 NOTE — TOC Progression Note (Signed)
 Transition of Care [facility]) - Progression Note    Patient Details  Name: Billy Franco MRN: [id number] Date of Birth: 01-01-1941  Transition of Care [facility]) CM/SW Contact  Loman Risk, RN Phone Number: 07/03/2023, 2:50 PM  Clinical Narrative:     Notified by MD that wife requesting to speak with hospice Wife requesting Civil engineer, contracting on [address]  I let her know that I could make the referral to [employer], however there is specific criteria to be eligible for inpatient hospice Referral made to Kara/Jack with [facility]        Expected Discharge Plan and Services                                               Social Determinants of Health (SDOH) Interventions SDOH Screenings   Food Insecurity: No Food Insecurity (06/22/2023)  Housing: Low Risk  (06/22/2023)  Transportation Needs: No Transportation Needs (06/22/2023)  Utilities: Not At Risk (06/22/2023)  Alcohol Screen: Low Risk  (01/24/2019)  Depression (PHQ2-9): Low Risk  (02/02/2023)  Financial Resource Strain: Low Risk  (01/24/2019)  Physical Activity: Sufficiently Active (01/24/2019)  Social Connections: Socially Integrated (06/22/2023)  Stress: No Stress Concern Present (01/24/2019)  Tobacco Use: Low Risk  (06/22/2023)    Readmission Risk Interventions     No data to display
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
## 2023-07-03 NOTE — TOC Progression Note (Signed)
 Transition of Care [facility]) - Progression Note    Patient Details  Name: Billy Franco MRN: [id number] Date of Birth: 01/01/1941  Transition of Care [facility]) CM/SW Contact  Loman Risk, RN Phone Number: 07/03/2023, 11:46 AM  Clinical Narrative:     Per MD further goals of care discussions to be had over the weekend.   Follow up with medical team on Monday to determine if plan will be to shift towards comfort care, or to start auth for [facility]         Expected Discharge Plan and Services                                               Social Determinants of Health (SDOH) Interventions SDOH Screenings   Food Insecurity: No Food Insecurity (06/22/2023)  Housing: Low Risk  (06/22/2023)  Transportation Needs: No Transportation Needs (06/22/2023)  Utilities: Not At Risk (06/22/2023)  Alcohol Screen: Low Risk  (01/24/2019)  Depression (PHQ2-9): Low Risk  (02/02/2023)  Financial Resource Strain: Low Risk  (01/24/2019)  Physical Activity: Sufficiently Active (01/24/2019)  Social Connections: Socially Integrated (06/22/2023)  Stress: No Stress Concern Present (01/24/2019)  Tobacco Use: Low Risk  (06/22/2023)    Readmission Risk Interventions     No data to display

## 2023-07-03 NOTE — Progress Notes (Addendum)
 PROGRESS NOTE    Billy Franco   ZOX:[id number] DOB: 1940/12/02  DOA: 06/22/2023 Date of Service: 07/03/23 which is hospital day 11  PCP: Sowles, Krichna, MD    Hospital course / significant events:   Billy Franco is a 83 y.o. 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 Upon SLP, barium swallow study- patient has severe pharyngeal musculature weakness and high risk of aspiration - PEG placed here.   Orthostatic hypotension impacting PT participation and was on multiple antihypertensives and midodrine  simultaneously at home. Holding all anti-HTN meds he is on borderline lower limits of normal BP and was able to participate with PT.   Complicated GOC discussions w/ patient, his wife, palliative care team, hospitalist team. See IPAL note and progress note 07/02/23. On 07/03/23 pt reports I want to go home wife asks home to our house or to your heavenly father? And he responds heaven  - Billy Franco would like to continue current care to give some time to speak with her children regarding next steps and possible dc PEG and transitioning to comfort only care / hospice. Would like to speak w/ hospice liaison for more information to make a decision     Consultants:  General surgery Neurosurgery  Infectious disease Interventional radiology  Palliative care   Procedures/Surgeries: 6/6 G tube placement       ASSESSMENT & PLAN:   Stage IV sacral ulcer  sacral osteomyelitis wound care Continue current antibiotics Infectious  disease on board - recommends 6 weeks of antibiotics PT OT has recommended acute rehab placement, family is considering for home or LTC potentially w/ hospice    Orthostatic hypotension Midodrine     Neurogenic Dysphagia, worsening Patient underwent PEG tube placement on 06/26/2023 Continue tube feeding Continue to monitor electrolytes to prevent refeeding syndrome Speech therapy and dietitian on board Palliative care on board Patient has been inconsistent in expressing his wishes re feeding tube, at one point I don't want to be on this forever then whatever it takes to keep me here. See below re: capacity assessment but he does not seem to understand consequences of options re: leave PEG or remove it, he does not seem to understand that his swallowing function is not going to improve / aspiration risk. I think this is what he means by Im not going to stay on this forever as in he will start swallowing okay again    Hyperkalemia Continue to monitor potassium closely   Unintentional weight loss  Failure to thrive Severe malnutrition in setting of malnutrition, dysphagia treat as above PT/OT, RD   Anemia of chronic disease baseline hgb around 10.5 Continue to monitor CBC   DM2 Continue SSI and CBG monitoring  Increased novolog   Added Semglee  today  Titrate as needed    Chronic urinary retention No acute concern   CKD stage IIIb Euvolemic, creatinine level stable Monitor BMP   Chronic HFrEF Euvolemic Continue home BP/CHF medications   PAF In sinus rhythm Not on anticoagulation due to history of gross hematuria   Goals of Care / Advanced Care Planning  See IPAL note 07/02/23  Briefly,  patient cannot voice clear understanding of his medical situation (particularly that the swallowing is not going to improve), his options and potential/likely outcomes of those options (leave PEG in place and expect this may prolong life and he has no way to receive nutrition without  it, vs remove it and recognize this will eventually result in starvation and subsequent organ failure and death). He has some capacity to voice his goals but he is not consistent with those goals and he cannot elucidate his reasoning which would support his choice. He does not have full decision-making capacity at this time and given worsening confusion today I do not expect him to regain capacity. Complex medical decisions will be up to patient's wife, which I communicated to her.  Confounding the issue is the fact he seems avoidant to discussing death whatsoever, he seems avoidant to discussing very much with women including myself and palliative care NP, wife thinks he might respond more to male pastor.  Billy Franco would like to continue current care to give some time to speak with her children regarding next steps and possible dc PEG and transitioning to comfort only care / hospice. Would like to speak w/ hospice liaison for more information to make a decision  Billy Franco is agreeable to DNR at this time - she and I discussed that he is not likely to survive resuscitation efforts should he arrest and I do not want him to experience pain without benefit. She agrees NO CPR OR INTUBATION   No concerns based on BMI: Body mass index is 22.79 kg/m.Billy Franco Significantly low or high BMI is associated with higher medical risk. However he is still malnourished  Underweight - under 18  overweight - 25 to 29 obese - 30 or more Class 1 obesity: BMI of 30.0 to 34 Class 2 obesity: BMI of 35.0 to 39 Class 3 obesity: BMI of 40.0 to 49 Super Morbid Obesity: BMI 50-59 Super-super Morbid Obesity: BMI 60+ Healthy nutrition and physical activity advised as adjunct to other disease management and risk reduction treatments    DVT prophylaxis: lovenox  IV fluids: NS continuous IV fluids  Nutrition: tube feeds Central lines / other devices: G tube  Code Status: FULL CODE ACP documentation reviewed:  none on file  in [facility]  TOC needs: placement  Medical barriers to dispo: bleeding yesterday from G tube. Expected medical readiness for discharge possible today however goals of care discussions are in process, getting Glc under control.              Subjective / Brief ROS:  Patient reports no pain right now  Denies CP/SOB.  Denies new weakness.  He still does not convey his wishes clearly   Family Communication: wife at bediside on rounds and she and I discussed his care separately as well see A/P    Objective Findings:  Vitals:   07/02/23 2051 07/03/23 0437 07/03/23 0821 07/03/23 1547  BP: (!) 142/64 117/70 (!) 88/67 (!) 120/44  Pulse: 76 72 94 80  Resp: 16 16 14 18   Temp: 98.4 F (36.9 C) 98.2 F (36.8 C) 97.9 F (36.6 C) 97.6 F (36.4 C)  TempSrc: Oral Axillary Oral   SpO2: 100% 100% 100% 100%  Weight:      Height:        Intake/Output Summary (Last 24 hours) at 07/03/2023 1850 Last data filed at 07/03/2023 0441 Gross per 24 hour  Intake --  Output 975 ml  Net -975 ml   [employer]  06/26/23 1339 06/30/23 0500 07/02/23 0322  Weight: 63 kg 66 kg 66 kg    Examination:  Physical Exam Constitutional:      General: He is not in acute distress.    Appearance: He is ill-appearing (thin, frail).   Cardiovascular:     Rate and Rhythm: Normal rate and regular rhythm.  Pulmonary:     Effort: Pulmonary effort is normal.     Breath sounds: Normal breath sounds.  Abdominal:     General: Abdomen is flat.     Palpations: Abdomen is soft.     Comments: G tube in place, no active bleedin from site or around it, suture in place    Skin:    General: Skin is warm and dry.   Neurological:     Mental Status: He is alert. Mental status is at baseline.   Psychiatric:        Mood and Affect: Mood normal.        Behavior: Behavior normal.          Scheduled Medications:   vitamin C   500 mg Per Tube BID   aspirin   81 mg Per Tube Daily   Chlorhexidine  Gluconate  Cloth  6 each Topical Q0600   dextrose   50 mL Intravenous Once   enoxaparin  (LOVENOX ) injection  40 mg Subcutaneous Q24H   free water   200 mL Per Tube Q4H   insulin  aspart  0-15 Units Subcutaneous QID   insulin  aspart  5 Units Subcutaneous QID   insulin  glargine-yfgn  5 Units Subcutaneous QHS   leptospermum manuka honey  1 Application Topical Daily   midodrine   10 mg Oral TID WC   nutrition supplement (JUVEN)  1 packet Per Tube BID BM   sodium chloride  flush  10-40 mL Intracatheter Q12H    Continuous Infusions:  ampicillin -sulbactam (UNASYN ) IV 3 g (07/03/23 1718)   feeding supplement (OSMOLITE 1.5 CAL) 65 mL/hr at 06/30/23 1802    PRN Medications:  acetaminophen  **OR** acetaminophen , bisacodyl , ondansetron  **OR** ondansetron  (ZOFRAN ) IV, mouth rinse, polyethylene glycol, sodium chloride , sodium chloride  flush  Antimicrobials from admission:  Anti-infectives (From admission, onward)    Start     Dose/Rate Route Frequency Ordered Stop   06/27/23 1400  vancomycin  (VANCOCIN ) IVPB 1000 mg/200 mL premix  Status:  Discontinued        1,000 mg 200 mL/hr over 60 Minutes Intravenous Every 24 hours 06/26/23 1322 06/28/23 1043   06/26/23 1700  Ampicillin -Sulbactam (UNASYN ) 3 g in sodium chloride  0.9 % 100 mL IVPB        3 g 200 mL/hr over 30 Minutes Intravenous Every 6 hours 06/26/23 1558     06/26/23 0000  ceFAZolin  (ANCEF ) IVPB 2g/100 mL premix        2 g 200 mL/hr over 30 Minutes Intravenous To Radiology 06/24/23 1010 06/26/23 0348   06/23/23 1800  ceFEPIme  (MAXIPIME ) 2 g in sodium chloride  0.9 % 100 mL IVPB  Status:  Discontinued        2 g 200 mL/hr over 30 Minutes Intravenous Every 8 hours 06/23/23 1110 06/26/23 1558   06/23/23 1200  vancomycin  (VANCOREADY) IVPB 1250 mg/250 mL  Status:  Discontinued        1,250 mg 166.7 mL/hr over 90 Minutes Intravenous Every 24 hours 06/22/23 1608 06/23/23 1113   06/23/23 1200  vancomycin  (VANCOREADY) IVPB 1500 mg/300 mL  Status:  Discontinued         1,500 mg 150 mL/hr over 120 Minutes Intravenous Every  24 hours 06/23/23 1113 06/26/23 1322   06/22/23 2200  ceFEPIme  (MAXIPIME ) 2 g in sodium chloride  0.9 % 100 mL IVPB  Status:  Discontinued        2 g 200 mL/hr over 30 Minutes Intravenous Every 12 hours 06/22/23 1608 06/23/23 1110   06/22/23 1200  ceFEPIme  (MAXIPIME ) 2 g in sodium chloride  0.9 % 100 mL IVPB        2 g 200 mL/hr over 30 Minutes Intravenous  Once 06/22/23 1158 06/22/23 1302   06/22/23 1200  vancomycin  (VANCOREADY) IVPB 1500 mg/300 mL        1,500 mg 150 mL/hr over 120 Minutes Intravenous  Once 06/22/23 1158 06/22/23 1519           Data Reviewed:  I have personally reviewed the following...  CBC: Recent Labs  Lab 06/27/23 0548 06/28/23 0441 06/29/23 0419 06/30/23 0424 07/01/23 0405 07/02/23 0357 07/03/23 0425  WBC 10.0 9.8 8.7 10.4 8.5 8.6 8.4  NEUTROABS 8.0* 7.4 6.3 8.1* 6.0  --   --   HGB 9.7* 8.8* 9.0* 10.3* 8.2* 8.2* 8.3*  HCT 29.8* 26.2* 27.7* 32.0* 26.4* 26.4* 27.5*  MCV 92.5 92.6 93.9 94.4 97.1 98.1 98.2  PLT 342 320 340 378 341 351 373   Basic Metabolic Panel: Recent Labs  Lab 06/29/23 0419 06/30/23 0424 06/30/23 1833 07/01/23 0405 07/02/23 0357 07/03/23 0425  NA 140 140  --  143 147* 148*  K 3.7 6.0* 4.6 4.1 4.1 4.2  CL 108 105  --  108 111 112*  CO2 26 24  --  31 29 28   GLUCOSE 296* 310*  --  208* 272* 93  BUN 18 16  --  29* 22 22  CREATININE 0.59* 0.76  --  0.66 0.57* 0.53*  CALCIUM  8.4* 8.7*  --  8.4* 8.3* 8.5*  MG 1.6* 2.0  --  2.0 2.0 1.9  PHOS 2.0* 2.7  --  2.6 2.1* 2.8   GFR: Estimated Creatinine Clearance: 66.5 mL/min (A) (by C-G formula based on SCr of 0.53 mg/dL (L)). Liver Function Tests: No results for input(s): AST, ALT, ALKPHOS, BILITOT, PROT, ALBUMIN in the last 168 hours.  No results for input(s): LIPASE, AMYLASE in the last 168 hours. No results for input(s): AMMONIA in the last 168 hours. Coagulation Profile: No results for input(s):  INR, PROTIME in the last 168 hours.  Cardiac Enzymes: No results for input(s): CKTOTAL, CKMB, CKMBINDEX, TROPONINI in the last 168 hours. BNP (last 3 results) No results for input(s): PROBNP in the last 8760 hours. HbA1C: Recent Labs    07/02/23 0357  HGBA1C 6.8*   CBG: Recent Labs  Lab 07/02/23 2320 07/03/23 0540 07/03/23 0824 07/03/23 1158 07/03/23 1549  GLUCAP 91 105* 123* 169* 203*   Lipid Profile: No results for input(s): CHOL, HDL, LDLCALC, TRIG, CHOLHDL, LDLDIRECT in the last 72 hours. Thyroid  Function Tests: No results for input(s): TSH, T4TOTAL, FREET4, T3FREE, THYROIDAB in the last 72 hours. Anemia Panel: No results for input(s): VITAMINB12, FOLATE, FERRITIN, TIBC, IRON, RETICCTPCT in the last 72 hours. Most Recent Urinalysis On File:     Component Value Date/Time   COLORURINE YELLOW 06/22/2023 1010   APPEARANCEUR CLEAR 06/22/2023 1010   LABSPEC 1.020 06/22/2023 1010   PHURINE 5.5 06/22/2023 1010   GLUCOSEU 250 (A) 06/22/2023 1010   HGBUR MODERATE (A) 06/22/2023 1010   BILIRUBINUR NEGATIVE 06/22/2023 1010   KETONESUR NEGATIVE 06/22/2023 1010   PROTEINUR 30 (A) 06/22/2023 1010   UROBILINOGEN 0.2 04/16/2008  0202   NITRITE NEGATIVE 06/22/2023 1010   LEUKOCYTESUR SMALL (A) 06/22/2023 1010   Sepsis Labs: @LABRCNTIP (procalcitonin:4,lacticidven:4) Microbiology: No results found for this or any previous visit (from the past 240 hours).     Radiology Studies last 3 days: No results found.      Saulo Anthis, DO [facility] 07/03/2023, 6:50 PM    Dictation software may have been used to generate the above note. Typos may occur and escape review in typed/dictated notes. Please contact Dr Authur Leghorn directly for clarity if needed.  Staff may message me via secure chat in Epic  but this may not receive an immediate response,  please page me for urgent matters!  If 7PM-7AM, please contact night  coverage [email or web address]

## 2023-07-04 DIAGNOSIS — Z7189 Other specified counseling: Secondary | ICD-10-CM | POA: Diagnosis not present

## 2023-07-04 LAB — GLUCOSE, CAPILLARY
Glucose-Capillary: 167 mg/dL — ABNORMAL HIGH (ref 70–99)
Glucose-Capillary: 189 mg/dL — ABNORMAL HIGH (ref 70–99)
Glucose-Capillary: 190 mg/dL — ABNORMAL HIGH (ref 70–99)
Glucose-Capillary: 216 mg/dL — ABNORMAL HIGH (ref 70–99)
Glucose-Capillary: 249 mg/dL — ABNORMAL HIGH (ref 70–99)
Glucose-Capillary: 250 mg/dL — ABNORMAL HIGH (ref 70–99)

## 2023-07-04 LAB — BASIC METABOLIC PANEL WITH GFR
Anion gap: 8 (ref 5–15)
BUN: 23 mg/dL (ref 8–23)
CO2: 27 mmol/L (ref 22–32)
Calcium: 8.4 mg/dL — ABNORMAL LOW (ref 8.9–10.3)
Chloride: 109 mmol/L (ref 98–111)
Creatinine, Ser: 0.61 mg/dL (ref 0.61–1.24)
GFR, Estimated: >60 mL/min (ref >60)
Glucose, Bld: 257 mg/dL — ABNORMAL HIGH (ref 70–99)
Potassium: 4.2 mmol/L (ref 3.5–5.1)
Sodium: 144 mmol/L (ref 135–145)

## 2023-07-04 LAB — CBC
HCT: 26.1 % — ABNORMAL LOW (ref 39.0–52.0)
Hemoglobin: 8 g/dL — ABNORMAL LOW (ref 13.0–17.0)
MCH: 29.9 pg (ref 26.0–34.0)
MCHC: 30.7 g/dL (ref 30.0–36.0)
MCV: 97.4 fL (ref 80.0–100.0)
Platelets: 373 10*3/uL (ref 150–400)
RBC: 2.68 MIL/uL — ABNORMAL LOW (ref 4.22–5.81)
RDW: 14.6 % (ref 11.5–15.5)
WBC: 7.1 10*3/uL (ref 4.0–10.5)
nRBC: 0 % (ref 0.0–0.2)

## 2023-07-04 LAB — PHOSPHORUS: Phosphorus: 2.6 mg/dL (ref 2.5–4.6)

## 2023-07-04 LAB — MAGNESIUM: Magnesium: 1.8 mg/dL (ref 1.7–2.4)

## 2023-07-04 NOTE — Progress Notes (Signed)
 PROGRESS NOTE    Billy Franco   UVO:[id number] DOB: 11-10-1940  DOA: 06/22/2023 Date of Service: 07/04/23 which is hospital day 12  PCP: Sowles, Krichna, MD    Hospital course / significant events:   Billy Franco is a 83 y.o. 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 Upon SLP, barium swallow study- patient has severe pharyngeal musculature weakness and high risk of aspiration - PEG placed here.   Orthostatic hypotension impacting PT participation and was on multiple antihypertensives and midodrine  simultaneously at home. Holding all anti-HTN meds he is on borderline lower limits of normal BP and was able to participate with PT.   Complicated GOC discussions w/ patient, his wife, palliative care team, hospitalist team. See IPAL note and progress notes 07/02/23, 07/03/23. Patient and his wife would like to continue current care to give some time to speak with her children regarding next steps and dc PEG and transitioning to comfort only care / hospice.      Consultants:  General surgery Neurosurgery  Infectious disease Interventional radiology  Palliative care   Procedures/Surgeries: 6/6 G tube placement       ASSESSMENT & PLAN:   Goals of Care / Advanced Care Planning  See IPAL note 07/02/23  Briefly, patient cannot or will not voice clear or thorough understanding of his medical situation (particularly that the swallowing is not going to improve), his options and potential/likely outcomes of those options (leave PEG in  place and expect this may prolong life and he has no way to receive nutrition without it, vs remove it and recognize this will eventually result in starvation and subsequent organ failure and death). He does NOT have full decision-making capacity at this time for complex decisions regarding his care, and given worsening confusion today I do not expect him to regain capacity. Complex medical decisions will be up to patient's wife, which I communicated to her. Mrs. Billy Franco would like to continue current care to give some time to speak with her children regarding next steps and dc PEG and transitioning to comfort only care / hospice.  Mrs Billy Franco is agreeable to DNR at this time - she and I discussed 06/13 that he is not likely to survive resuscitation efforts should he arrest and I do not want him to experience pain without benefit. She agrees NO CPR OR INTUBATION If/when transition to comfort-based care, will discontinue PEG, antibiotics, labs, oral/tube medications  Stage IV sacral ulcer  sacral osteomyelitis wound care Continue current antibiotics Infectious disease on board - recommends 6 weeks of antibiotics PT OT has recommended acute rehab placement, family is considering for home or LTC potentially w/ hospice    Orthostatic hypotension Midodrine     Neurogenic Dysphagia, worsening Patient underwent PEG tube placement on 06/26/2023 Continue tube feeding Continue to monitor electrolytes to prevent refeeding syndrome Speech therapy and dietitian on board Palliative care on board Considering for removal of tube and po feedings as desired w/ recognition of aspiration risk - patient appears to understand risks/benefits on basic level but he is inconsistent and unwilling/unable to elucidate his reasoning so therefore does have LIMITED  CAPACITY and his wife is primary decision-maker acting on his behalf    Hyperkalemia Continue to monitor potassium closely   Unintentional weight loss  Failure  to thrive Severe malnutrition in setting of malnutrition, dysphagia treat as above PT/OT, RD   Anemia of chronic disease baseline hgb around 10.5 Continue to monitor CBC   DM2 Continue SSI and CBG monitoring  Increased novolog   Added Semglee  today  Titrate as needed    Chronic urinary retention No acute concern   CKD stage IIIb Euvolemic, creatinine level stable Monitor BMP   Chronic HFrEF Euvolemic Continue home BP/CHF medications   PAF In sinus rhythm Not on anticoagulation due to history of gross hematuria    No concerns based on BMI: Body mass index is 22.79 kg/m.Billy Franco Significantly low or high BMI is associated with higher medical risk. However he is still malnourished  Underweight - under 18  overweight - 25 to 29 obese - 30 or more Class 1 obesity: BMI of 30.0 to 34 Class 2 obesity: BMI of 35.0 to 39 Class 3 obesity: BMI of 40.0 to 49 Super Morbid Obesity: BMI 50-59 Super-super Morbid Obesity: BMI 60+ Healthy nutrition and physical activity advised as adjunct to other disease management and risk reduction treatments    DVT prophylaxis: lovenox  IV fluids: NS continuous IV fluids  Nutrition: tube feeds Central lines / other devices: G tube  Code Status: FULL CODE ACP documentation reviewed:  none on file in [facility]  TOC needs: DME and home hospice Medical barriers to dispo: decision-making re: comfort measures, expect clear plan tomorrow once family meeting can take place              Subjective / Brief ROS:  Patient reports no pain right now  He reports desire to go home and die at home, he is tired of being incapacitated and not getting better  Denies CP/SOB.  Denies new weakness.   Family Communication: wife at bediside on rounds and she and I discussed his care separately as well see A/P    Objective Findings:  Vitals:   07/03/23 2058 07/04/23 0405 07/04/23 0500 07/04/23 0758  BP: 113/60 (!) 103/54  (!) 107/56  Pulse: 84 82  73   Resp: 15 16  17   Temp: 97.7 F (36.5 C) 98.2 F (36.8 C)  97.9 F (36.6 C)  TempSrc:      SpO2: 100% 100%  100%  Weight:   67.1 kg   Height:        Intake/Output Summary (Last 24 hours) at 07/04/2023 0910 Last data filed at 07/04/2023 0800 Gross per 24 hour  Intake 1815.08 ml  Output 1000 ml  Net 815.08 ml   Filed Weights   06/30/23 0500 07/02/23 0322 07/04/23 0500  Weight: 66 kg 66 kg 67.1 kg    Examination:  Physical Exam Constitutional:      General: He is not in acute distress.    Appearance: He is ill-appearing (thin, frail).   Cardiovascular:     Rate and Rhythm: Normal rate and regular rhythm.  Pulmonary:     Effort: Pulmonary effort is normal.     Breath sounds: Normal breath sounds.  Abdominal:     General: Abdomen is flat.     Palpations: Abdomen is soft.     Comments: G tube in place, no active bleedin from site or around it, suture in place    Skin:    General: Skin is warm and dry.   Neurological:  Mental Status: He is alert. Mental status is at baseline.   Psychiatric:        Mood and Affect: Mood normal.        Behavior: Behavior normal.          Scheduled Medications:   vitamin C   500 mg Per Tube BID   aspirin   81 mg Per Tube Daily   Chlorhexidine  Gluconate Cloth  6 each Topical Q0600   dextrose   50 mL Intravenous Once   enoxaparin  (LOVENOX ) injection  40 mg Subcutaneous Q24H   free water   200 mL Per Tube Q4H   insulin  aspart  0-15 Units Subcutaneous QID   insulin  aspart  5 Units Subcutaneous QID   insulin  glargine-yfgn  5 Units Subcutaneous QHS   leptospermum manuka honey  1 Application Topical Daily   midodrine   10 mg Oral TID WC   nutrition supplement (JUVEN)  1 packet Per Tube BID BM   sodium chloride  flush  10-40 mL Intracatheter Q12H    Continuous Infusions:  ampicillin -sulbactam (UNASYN ) IV 200 mL/hr at 07/04/23 0800   feeding supplement (OSMOLITE 1.5 CAL) 65 mL/hr at 07/04/23 0800    PRN Medications:   acetaminophen  **OR** acetaminophen , bisacodyl , ondansetron  **OR** ondansetron  (ZOFRAN ) IV, mouth rinse, polyethylene glycol, sodium chloride , sodium chloride  flush  Antimicrobials from admission:  Anti-infectives (From admission, onward)    Start     Dose/Rate Route Frequency Ordered Stop   06/27/23 1400  vancomycin  (VANCOCIN ) IVPB 1000 mg/200 mL premix  Status:  Discontinued        1,000 mg 200 mL/hr over 60 Minutes Intravenous Every 24 hours 06/26/23 1322 06/28/23 1043   06/26/23 1700  Ampicillin -Sulbactam (UNASYN ) 3 g in sodium chloride  0.9 % 100 mL IVPB        3 g 200 mL/hr over 30 Minutes Intravenous Every 6 hours 06/26/23 1558     06/26/23 0000  ceFAZolin  (ANCEF ) IVPB 2g/100 mL premix        2 g 200 mL/hr over 30 Minutes Intravenous To Radiology 06/24/23 1010 06/26/23 0348   06/23/23 1800  ceFEPIme  (MAXIPIME ) 2 g in sodium chloride  0.9 % 100 mL IVPB  Status:  Discontinued        2 g 200 mL/hr over 30 Minutes Intravenous Every 8 hours 06/23/23 1110 06/26/23 1558   06/23/23 1200  vancomycin  (VANCOREADY) IVPB 1250 mg/250 mL  Status:  Discontinued        1,250 mg 166.7 mL/hr over 90 Minutes Intravenous Every 24 hours 06/22/23 1608 06/23/23 1113   06/23/23 1200  vancomycin  (VANCOREADY) IVPB 1500 mg/300 mL  Status:  Discontinued        1,500 mg 150 mL/hr over 120 Minutes Intravenous Every 24 hours 06/23/23 1113 06/26/23 1322   06/22/23 2200  ceFEPIme  (MAXIPIME ) 2 g in sodium chloride  0.9 % 100 mL IVPB  Status:  Discontinued        2 g 200 mL/hr over 30 Minutes Intravenous Every 12 hours 06/22/23 1608 06/23/23 1110   06/22/23 1200  ceFEPIme  (MAXIPIME ) 2 g in sodium chloride  0.9 % 100 mL IVPB        2 g 200 mL/hr over 30 Minutes Intravenous  Once 06/22/23 1158 06/22/23 1302   06/22/23 1200  vancomycin  (VANCOREADY) IVPB 1500 mg/300 mL        1,500 mg 150 mL/hr over 120 Minutes Intravenous  Once 06/22/23 1158 06/22/23 1519           Data Reviewed:  I have personally reviewed  the following...  CBC: Recent Labs  Lab 06/28/23 0441 06/29/23 0419 06/30/23 0424 07/01/23 0405 07/02/23 0357 07/03/23 0425 07/04/23 0414  WBC 9.8 8.7 10.4 8.5 8.6 8.4 7.1  NEUTROABS 7.4 6.3 8.1* 6.0  --   --   --   HGB 8.8* 9.0* 10.3* 8.2* 8.2* 8.3* 8.0*  HCT 26.2* 27.7* 32.0* 26.4* 26.4* 27.5* 26.1*  MCV 92.6 93.9 94.4 97.1 98.1 98.2 97.4  PLT 320 340 378 341 351 373 373   Basic Metabolic Panel: Recent Labs  Lab 06/30/23 0424 06/30/23 1833 07/01/23 0405 07/02/23 0357 07/03/23 0425 07/04/23 0414  NA 140  --  143 147* 148* 144  K 6.0* 4.6 4.1 4.1 4.2 4.2  CL 105  --  108 111 112* 109  CO2 24  --  31 29 28 27   GLUCOSE 310*  --  208* 272* 93 257*  BUN 16  --  29* 22 22 23   CREATININE 0.76  --  0.66 0.57* 0.53* 0.61  CALCIUM  8.7*  --  8.4* 8.3* 8.5* 8.4*  MG 2.0  --  2.0 2.0 1.9 1.8  PHOS 2.7  --  2.6 2.1* 2.8 2.6   GFR: Estimated Creatinine Clearance: 66.6 mL/min (by C-G formula based on SCr of 0.61 mg/dL). Liver Function Tests: No results for input(s): AST, ALT, ALKPHOS, BILITOT, PROT, ALBUMIN in the last 168 hours.  No results for input(s): LIPASE, AMYLASE in the last 168 hours. No results for input(s): AMMONIA in the last 168 hours. Coagulation Profile: No results for input(s): INR, PROTIME in the last 168 hours.  Cardiac Enzymes: No results for input(s): CKTOTAL, CKMB, CKMBINDEX, TROPONINI in the last 168 hours. BNP (last 3 results) No results for input(s): PROBNP in the last 8760 hours. HbA1C: Recent Labs    07/02/23 0357  HGBA1C 6.8*   CBG: Recent Labs  Lab 07/03/23 1549 07/03/23 2112 07/04/23 0049 07/04/23 0407 07/04/23 0800  GLUCAP 203* 126* 216* 249* 250*   Lipid Profile: No results for input(s): CHOL, HDL, LDLCALC, TRIG, CHOLHDL, LDLDIRECT in the last 72 hours. Thyroid  Function Tests: No results for input(s): TSH, T4TOTAL, FREET4, T3FREE, THYROIDAB in the last 72 hours. Anemia  Panel: No results for input(s): VITAMINB12, FOLATE, FERRITIN, TIBC, IRON, RETICCTPCT in the last 72 hours. Most Recent Urinalysis On File:     Component Value Date/Time   COLORURINE YELLOW 06/22/2023 1010   APPEARANCEUR CLEAR 06/22/2023 1010   LABSPEC 1.020 06/22/2023 1010   PHURINE 5.5 06/22/2023 1010   GLUCOSEU 250 (A) 06/22/2023 1010   HGBUR MODERATE (A) 06/22/2023 1010   BILIRUBINUR NEGATIVE 06/22/2023 1010   KETONESUR NEGATIVE 06/22/2023 1010   PROTEINUR 30 (A) 06/22/2023 1010   UROBILINOGEN 0.2 04/16/2008 0202   NITRITE NEGATIVE 06/22/2023 1010   LEUKOCYTESUR SMALL (A) 06/22/2023 1010   Sepsis Labs: @LABRCNTIP (procalcitonin:4,lacticidven:4) Microbiology: No results found for this or any previous visit (from the past 240 hours).     Radiology Studies last 3 days: No results found.   Time spent 50 min      Melodi Sprung, DO [facility] 07/04/2023, 9:10 AM    Dictation software may have been used to generate the above note. Typos may occur and escape review in typed/dictated notes. Please contact Dr Authur Leghorn directly for clarity if needed.  Staff may message me via secure chat in Epic  but this may not receive an immediate response,  please page me for urgent matters!  If 7PM-7AM, please contact night coverage [email or web address]

## 2023-07-04 NOTE — Plan of Care (Signed)

## 2023-07-04 NOTE — Plan of Care (Signed)
   Problem: Education: Goal: Ability to describe self-care measures that may prevent or decrease complications (Diabetes Survival Skills Education) will improve Outcome: Progressing

## 2023-07-05 DIAGNOSIS — Z7189 Other specified counseling: Secondary | ICD-10-CM | POA: Diagnosis not present

## 2023-07-05 LAB — PHOSPHORUS: Phosphorus: 2.7 mg/dL (ref 2.5–4.6)

## 2023-07-05 LAB — GLUCOSE, CAPILLARY
Glucose-Capillary: 236 mg/dL — ABNORMAL HIGH (ref 70–99)
Glucose-Capillary: 225 mg/dL — ABNORMAL HIGH (ref 70–99)
Glucose-Capillary: 167 mg/dL — ABNORMAL HIGH (ref 70–99)
Glucose-Capillary: 133 mg/dL — ABNORMAL HIGH (ref 70–99)

## 2023-07-05 LAB — MAGNESIUM: Magnesium: 1.8 mg/dL (ref 1.7–2.4)

## 2023-07-05 MED ORDER — OXYCODONE HCL 5 MG PO TABS
5.0000 mg | ORAL_TABLET | ORAL | Status: DC | PRN
  Administered 2023-07-06 – 2023-07-07 (×3): 5 mg
  Filled 2023-07-05 (×3): qty 1

## 2023-07-05 MED ORDER — POLYETHYLENE GLYCOL 3350 17 G PO PACK: 17.0000 g | PACK | Freq: Every day | ORAL | Status: DC | PRN

## 2023-07-05 MED ORDER — MIDODRINE HCL 5 MG PO TABS
10.0000 mg | ORAL_TABLET | Freq: Three times a day (TID) | ORAL | Status: DC
  Administered 2023-07-05 – 2023-07-07 (×3): 10 mg
  Filled 2023-07-05 (×7): qty 2

## 2023-07-05 MED ORDER — ONDANSETRON HCL 4 MG PO TABS: 4.0000 mg | ORAL_TABLET | Freq: Four times a day (QID) | ORAL | Status: DC | PRN

## 2023-07-05 MED ORDER — ONDANSETRON HCL 4 MG/2ML IJ SOLN: 4.0000 mg | Freq: Four times a day (QID) | INTRAMUSCULAR | Status: DC | PRN

## 2023-07-05 NOTE — IPAL (Signed)
  Interdisciplinary Goals of Care Family Meeting   Date carried out: 07/05/2023  Location of the meeting: Bedside  Member's involved: Physician and Family Member or next of kin: patient's wife, patients daughter and her husband, patient's son and his wife    Durable Power of Pensions consultant or Environmental health practitioner: wife    Discussion: We discussed goals of care for [employer] .   Given goal gor peaceful death at home with hospice, while allowing time to hopefully maintain some alertness/lucitidity to spend with family. Recognizing abx are not likely to prolong life and would d/c IV abx/ other IV meds on discharge. Recognizing tube feeds are not likely to prolong life but are ok to continue for now as option. Recognizing aspiration risk vs eating/intake for pleasure as desired   Goals/plan are as follows: Home with hospice, need DME and need instructions on care for wounds, admin meds, cleaning, etc.  Meds to continue to potentially prolong life as long as patient is lucid: tube feeds, insulin , meds via PEG Meds to treat symptoms: leaving PEG in place for med admin at home.  Meds to d/c on discharge: antibiotics  Anticipate over time he will become more confused, in more pain - at this time would transition to comfort-ONLY based treatments, stop tube feeds and other meds not treating pain/anxiety/agitation.   Code status:   Code Status: Limited: Do not attempt resuscitation (DNR) -DNR-LIMITED -Do Not Intubate/DNI    Disposition: Home with Hospice  Time spent for the meeting: 30 min     Melodi Sprung, DO  07/05/2023, 9:35 AM

## 2023-07-05 NOTE — Plan of Care (Signed)

## 2023-07-05 NOTE — Progress Notes (Signed)
 [facility] Liaison Note  Follow up on new referral for hospice services at home post hospital discharge. Post GOC meeting today with MD and family.   Plan is to still go home with hospice services. DME needs:  hospital bed, air mattress and over bed table.  Will arrange for DME to be delivered Tuesday am with planned hospital d/c on Tuesday afternoon once DME is in place.  Family needs time to move furniture and make space for the hospital bed.  Family is meeting tomorrow evening to complete the task.  Patient will remain on tube feeds to gravity per wife.  Osmolite ordered.  Hospice Liaison will continue to follow through final disposition. Thank you for allowing participation in this patient's care.  Ambrosio Junker, MA, BSN, RN, FNE Nurse Liaison [phone]

## 2023-07-05 NOTE — Progress Notes (Signed)
 PROGRESS NOTE    Arad Burston   ZOX:[id number] DOB: 1940/07/22  DOA: 06/22/2023 Date of Service: 07/05/23 which is hospital day 13  PCP: Arleen Lacer, MD    Hospital course / significant events:   Cassian Torelli is a 83 y.o. 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 Upon SLP, barium swallow study- patient has severe pharyngeal musculature weakness and high risk of aspiration - PEG placed here.   Orthostatic hypotension impacting PT participation and was on multiple antihypertensives and midodrine  simultaneously at home. Holding all anti-HTN meds he is on borderline lower limits of normal BP and was able to participate with PT.   Complicated GOC discussions w/ patient, his wife, palliative care team, hospitalist team. See IPAL note and progress notes 07/02/23, 07/03/23. Patient and his wife would like to continue current care to give some time to speak with her children regarding next steps. Family meeting today 07/05/23 - see IPAL note and A/P below      Consultants:  General surgery Neurosurgery  Infectious disease Interventional radiology  Palliative care   Procedures/Surgeries: 6/6 G tube placement       ASSESSMENT & PLAN:   Goals of Care / Advanced Care Planning  See IPAL note 07/02/23, 07/05/23  Note pt does NOT have FULL decision making capacity, defer to wife re: complex decisions/plans. See previous notes for further comment on capacity.  Pt more alert/coherent past couple days and is clear he wants to go back  to his house and die at home peacefully. Spoke w/ family at bedside today, daughter, son, and their spouses were present as well as patient's wife and patient. All questions were answered. Hospice to follow. See IPAL note DNR Goals/plan are as follows: Home with hospice, need DME and need instructions on care for wounds, admin meds, cleaning, etc.  Meds to continue to potentially prolong life as long as patient is lucid: tube feeds, insulin , meds via PEG Meds to treat symptoms: leaving PEG in place for med admin at home.  Meds to d/c on discharge: antibiotics  Anticipate over time he will become more confused, in more pain - at this time would transition to comfort-ONLY based treatments, stop tube feeds    Stage IV sacral ulcer  sacral osteomyelitis wound care Educated family that wound care / pain control will be the goal, will not go home on antibiotics    Orthostatic hypotension Midodrine  to avoid dizziness/lightheadedness    Neurogenic Dysphagia, worsening Unintentional weight loss  Failure to thrive Severe malnutrition Patient underwent PEG tube placement on 06/26/2023 Continue tube feeding as long as pt/family desire I was clear w/ them that nutrition will maybe buy some time but will not result in recovery, it is maybe keeping things going for now but should eventually stop once he gets more confused/weak.  Continue to monitor electrolytes in-house  Speech therapy and dietitian on board Palliative care on board Considering for po feedings as desired w/ recognition of aspiration risk -  I was clear w/ family we will not give him food/drink here but they can do so at home if desired Continue  Foley   DM2 Continue SSI and CBG monitoring  Added Semglee   Titrate as needed   Hyperkalemia monitor potassium    Anemia of chronic disease baseline hgb around 10.5 monitor CBC   CKD stage IIIb Monitor BMP for now    Chronic HFrEF Euvolemic Monitor fluid status    PAF In  sinus rhythm Not on anticoagulation due to history of gross hematuria Consider beta blocker if palpitations become uncomfortable     No concerns based on BMI: Body mass index is 23.18 kg/m.Aaron Aas Significantly low or high BMI is associated with higher medical risk. However he is still malnourished  Underweight - under 18  overweight - 25 to 29 obese - 30 or more Class 1 obesity: BMI of 30.0 to 34 Class 2 obesity: BMI of 35.0 to 39 Class 3 obesity: BMI of 40.0 to 49 Super Morbid Obesity: BMI 50-59 Super-super Morbid Obesity: BMI 60+ Healthy nutrition and physical activity advised as adjunct to other disease management and risk reduction treatments    DVT prophylaxis: lovenox  IV fluids: none at this time  Nutrition: tube feeds Central lines / other devices: PEG tube, Foley  Code Status: DNR ACP documentation reviewed:  none on file in [facility]  TOC needs: DME and home hospice Medical barriers to dispo: tain family to administer meds / wound care              Subjective / Brief ROS:  Patient reports no pain right now  He reports desire to go home and die at home, he is tired of being incapacitated and not getting better  Denies CP/SOB.  Denies new weakness.   Family Communication: family meeting today on rounds see IPAL note     Objective Findings:  Vitals:   07/04/23 2028 07/05/23 0351 07/05/23 0355 07/05/23 0810  BP: (!) 113/53 (!) 91/43  (!) 103/58  Pulse: 68 94  91  Resp: 16 18  18   Temp: 98.1 F (36.7 C) 97.7 F (36.5 C)    TempSrc: Oral   Oral  SpO2: 100% 100%  100%  Weight:   68 kg   Height:        Intake/Output Summary (Last 24 hours) at 07/05/2023 0935 Last data filed at 07/05/2023 0800 Gross per 24 hour  Intake 2360 ml  Output 2600 ml  Net -240 ml   Filed Weights   07/02/23 0322 07/04/23 0500 07/05/23 0355  Weight: 66 kg 67.1 kg 68 kg    Examination:  Physical Exam Constitutional:      General: He is not in acute distress.    Appearance:  He is ill-appearing (thin, frail).   Cardiovascular:     Rate and Rhythm: Normal rate and regular rhythm.  Pulmonary:     Effort: Pulmonary effort is normal.     Breath sounds: Normal breath sounds.  Abdominal:     General: Abdomen is flat.     Palpations: Abdomen is soft.   Skin:    General: Skin is warm and dry.   Neurological:     Mental Status: He is alert. Mental status is at baseline.   Psychiatric:        Mood and Affect: Mood normal.        Behavior: Behavior normal.          Scheduled Medications:   vitamin C   500 mg Per Tube BID   Chlorhexidine  Gluconate Cloth  6 each Topical Q0600   enoxaparin  (LOVENOX ) injection  40 mg Subcutaneous Q24H  free water   200 mL Per Tube Q4H   insulin  aspart  0-15 Units Subcutaneous QID   insulin  aspart  5 Units Subcutaneous QID   insulin  glargine-yfgn  5 Units Subcutaneous QHS   leptospermum manuka honey  1 Application Topical Daily   midodrine   10 mg Per Tube TID WC   nutrition supplement (JUVEN)  1 packet Per Tube BID BM   sodium chloride  flush  10-40 mL Intracatheter Q12H    Continuous Infusions:  ampicillin -sulbactam (UNASYN ) IV Stopped (07/05/23 0617)   feeding supplement (OSMOLITE 1.5 CAL) 65 mL/hr at 07/05/23 0800    PRN Medications:  acetaminophen  **OR** acetaminophen , bisacodyl , ondansetron  **OR** ondansetron  (ZOFRAN ) IV, mouth rinse, oxyCODONE , polyethylene glycol, sodium chloride , sodium chloride  flush  Antimicrobials from admission:  Anti-infectives (From admission, onward)    Start     Dose/Rate Route Frequency Ordered Stop   06/27/23 1400  vancomycin  (VANCOCIN ) IVPB 1000 mg/200 mL premix  Status:  Discontinued        1,000 mg 200 mL/hr over 60 Minutes Intravenous Every 24 hours 06/26/23 1322 06/28/23 1043   06/26/23 1700  Ampicillin -Sulbactam (UNASYN ) 3 g in sodium chloride  0.9 % 100 mL IVPB        3 g 200 mL/hr over 30 Minutes Intravenous Every 6 hours 06/26/23 1558     06/26/23 0000  ceFAZolin   (ANCEF ) IVPB 2g/100 mL premix        2 g 200 mL/hr over 30 Minutes Intravenous To Radiology 06/24/23 1010 06/26/23 0348   06/23/23 1800  ceFEPIme  (MAXIPIME ) 2 g in sodium chloride  0.9 % 100 mL IVPB  Status:  Discontinued        2 g 200 mL/hr over 30 Minutes Intravenous Every 8 hours 06/23/23 1110 06/26/23 1558   06/23/23 1200  vancomycin  (VANCOREADY) IVPB 1250 mg/250 mL  Status:  Discontinued        1,250 mg 166.7 mL/hr over 90 Minutes Intravenous Every 24 hours 06/22/23 1608 06/23/23 1113   06/23/23 1200  vancomycin  (VANCOREADY) IVPB 1500 mg/300 mL  Status:  Discontinued        1,500 mg 150 mL/hr over 120 Minutes Intravenous Every 24 hours 06/23/23 1113 06/26/23 1322   06/22/23 2200  ceFEPIme  (MAXIPIME ) 2 g in sodium chloride  0.9 % 100 mL IVPB  Status:  Discontinued        2 g 200 mL/hr over 30 Minutes Intravenous Every 12 hours 06/22/23 1608 06/23/23 1110   06/22/23 1200  ceFEPIme  (MAXIPIME ) 2 g in sodium chloride  0.9 % 100 mL IVPB        2 g 200 mL/hr over 30 Minutes Intravenous  Once 06/22/23 1158 06/22/23 1302   06/22/23 1200  vancomycin  (VANCOREADY) IVPB 1500 mg/300 mL        1,500 mg 150 mL/hr over 120 Minutes Intravenous  Once 06/22/23 1158 06/22/23 1519           Data Reviewed:  I have personally reviewed the following...  CBC: Recent Labs  Lab 06/29/23 0419 06/30/23 0424 07/01/23 0405 07/02/23 0357 07/03/23 0425 07/04/23 0414  WBC 8.7 10.4 8.5 8.6 8.4 7.1  NEUTROABS 6.3 8.1* 6.0  --   --   --   HGB 9.0* 10.3* 8.2* 8.2* 8.3* 8.0*  HCT 27.7* 32.0* 26.4* 26.4* 27.5* 26.1*  MCV 93.9 94.4 97.1 98.1 98.2 97.4  PLT 340 378 341 351 373 373   Basic Metabolic Panel: Recent Labs  Lab 06/30/23 0424 06/30/23 1833 07/01/23 0405 07/02/23 0357 07/03/23 0425 07/04/23 0414  07/05/23 0601  NA 140  --  143 147* 148* 144  --   K 6.0* 4.6 4.1 4.1 4.2 4.2  --   CL 105  --  108 111 112* 109  --   CO2 24  --  31 29 28 27   --   GLUCOSE 310*  --  208* 272* 93 257*  --    BUN 16  --  29* 22 22 23   --   CREATININE 0.76  --  0.66 0.57* 0.53* 0.61  --   CALCIUM  8.7*  --  8.4* 8.3* 8.5* 8.4*  --   MG 2.0  --  2.0 2.0 1.9 1.8 1.8  PHOS 2.7  --  2.6 2.1* 2.8 2.6 2.7   GFR: Estimated Creatinine Clearance: 66.6 mL/min (by C-G formula based on SCr of 0.61 mg/dL). Liver Function Tests: No results for input(s): AST, ALT, ALKPHOS, BILITOT, PROT, ALBUMIN in the last 168 hours.  No results for input(s): LIPASE, AMYLASE in the last 168 hours. No results for input(s): AMMONIA in the last 168 hours. Coagulation Profile: No results for input(s): INR, PROTIME in the last 168 hours.  Cardiac Enzymes: No results for input(s): CKTOTAL, CKMB, CKMBINDEX, TROPONINI in the last 168 hours. BNP (last 3 results) No results for input(s): PROBNP in the last 8760 hours. HbA1C: No results for input(s): HGBA1C in the last 72 hours.  CBG: Recent Labs  Lab 07/04/23 0800 07/04/23 1152 07/04/23 1727 07/04/23 2226 07/05/23 0807  GLUCAP 250* 189* 167* 190* 236*   Lipid Profile: No results for input(s): CHOL, HDL, LDLCALC, TRIG, CHOLHDL, LDLDIRECT in the last 72 hours. Thyroid  Function Tests: No results for input(s): TSH, T4TOTAL, FREET4, T3FREE, THYROIDAB in the last 72 hours. Anemia Panel: No results for input(s): VITAMINB12, FOLATE, FERRITIN, TIBC, IRON, RETICCTPCT in the last 72 hours. Most Recent Urinalysis On File:     Component Value Date/Time   COLORURINE YELLOW 06/22/2023 1010   APPEARANCEUR CLEAR 06/22/2023 1010   LABSPEC 1.020 06/22/2023 1010   PHURINE 5.5 06/22/2023 1010   GLUCOSEU 250 (A) 06/22/2023 1010   HGBUR MODERATE (A) 06/22/2023 1010   BILIRUBINUR NEGATIVE 06/22/2023 1010   KETONESUR NEGATIVE 06/22/2023 1010   PROTEINUR 30 (A) 06/22/2023 1010   UROBILINOGEN 0.2 04/16/2008 0202   NITRITE NEGATIVE 06/22/2023 1010   LEUKOCYTESUR SMALL (A) 06/22/2023 1010   Sepsis  Labs: @LABRCNTIP (procalcitonin:4,lacticidven:4) Microbiology: No results found for this or any previous visit (from the past 240 hours).     Radiology Studies last 3 days: No results found.   Time spent 50 min      Melodi Sprung, DO [facility] 07/05/2023, 9:35 AM    Dictation software may have been used to generate the above note. Typos may occur and escape review in typed/dictated notes. Please contact Dr Authur Leghorn directly for clarity if needed.  Staff may message me via secure chat in Epic  but this may not receive an immediate response,  please page me for urgent matters!  If 7PM-7AM, please contact night coverage [email or web address]

## 2023-07-06 DIAGNOSIS — Z7189 Other specified counseling: Secondary | ICD-10-CM | POA: Diagnosis not present

## 2023-07-06 LAB — GLUCOSE, CAPILLARY
Glucose-Capillary: 251 mg/dL — ABNORMAL HIGH (ref 70–99)
Glucose-Capillary: 180 mg/dL — ABNORMAL HIGH (ref 70–99)
Glucose-Capillary: 184 mg/dL — ABNORMAL HIGH (ref 70–99)
Glucose-Capillary: 77 mg/dL (ref 70–99)

## 2023-07-06 LAB — PHOSPHORUS: Phosphorus: 2.7 mg/dL (ref 2.5–4.6)

## 2023-07-06 LAB — MAGNESIUM: Magnesium: 2.7 mg/dL — ABNORMAL HIGH (ref 1.7–2.4)

## 2023-07-06 MED ORDER — PHENOL 1.4 % MT LIQD
1.0000 | OROMUCOSAL | Status: AC | PRN
  Filled 2023-07-06: qty 177

## 2023-07-06 NOTE — Progress Notes (Signed)
 OT Cancellation Note  Patient Details Name: Billy Franco MRN: [id number] DOB: November 08, 1940   Cancelled Treatment:    Reason Eval/Treat Not Completed: Other (comment). Per chart, patient to transition home with hospice with comfort care measures. Necessary DME ordered for patient and family. No further needs from OT at this point. OT will complete orders at this time.   Skarlet Lyons R., MPH, MS, OTR/L ascom [phone] 07/06/23, 8:27 AM

## 2023-07-06 NOTE — TOC Progression Note (Signed)
 Transition of Care [facility]) - Progression Note    Patient Details  Name: Billy Franco MRN: [id number] Date of Birth: 1940/09/01  [facility] CM/SW Contact  Loman Risk, RN Phone Number: 07/06/2023, 8:51 AM  Clinical Narrative:    Noted plan to discharge home Tuesday with [facility].  [facility] to arrange hospital bed, air mattress, and over bed table   [facility] update       Expected Discharge Plan and Services                                               Social Determinants of Health (SDOH) Interventions SDOH Screenings   Food Insecurity: No Food Insecurity (06/22/2023)  Housing: Low Risk  (06/22/2023)  Transportation Needs: No Transportation Needs (06/22/2023)  Utilities: Not At Risk (06/22/2023)  Alcohol Screen: Low Risk  (01/24/2019)  Depression (PHQ2-9): Low Risk  (02/02/2023)  Financial Resource Strain: Low Risk  (01/24/2019)  Physical Activity: Sufficiently Active (01/24/2019)  Social Connections: Socially Integrated (06/22/2023)  Stress: No Stress Concern Present (01/24/2019)  Tobacco Use: Low Risk  (06/22/2023)    Readmission Risk Interventions     No data to display
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
## 2023-07-06 NOTE — Progress Notes (Signed)
 [facility] Liaison Note   Follow up on new referral for hospice services at home post hospital discharge.  DME will be delivered tomorrow am by 1230. Hospice admission visit set for 3pm.   Patient will need discharge from [facility] in time to arrive at home via EMS by 2:30pm  [facility] TOC, and hospital medical care team notified of above.   Patient will remain on tube feeds to gravity for comfort as patient request.   Hospice Liaison will continue to follow through final disposition. Thank you for allowing participation in this patient's care.   Ambrosio Junker, MA, BSN, RN, FNE Nurse Liaison [phone]

## 2023-07-06 NOTE — Progress Notes (Signed)
 PROGRESS NOTE    Billy Franco   WUJ:[id number] DOB: 1940-12-13  DOA: 06/22/2023 Date of Service: 07/06/23 which is hospital day 14  PCP: Arleen Lacer, MD    Hospital course / significant events:   Billy Franco is a 83 y.o. 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 Upon SLP, barium swallow study- patient has severe pharyngeal musculature weakness and high risk of aspiration - PEG placed here.   Orthostatic hypotension impacting PT participation and was on multiple antihypertensives and midodrine  simultaneously at home. Holding all anti-HTN meds he is on borderline lower limits of normal BP and was able to participate with PT.   Complicated GOC discussions w/ patient, his wife, palliative care team, hospitalist team. See IPAL note and progress notes 07/02/23, 07/03/23. Family meeting 07/05/23 - see IPAL note and A/P below - Plan now for home with hospice      Consultants:  General surgery Neurosurgery  Infectious disease Interventional radiology  Palliative care   Procedures/Surgeries: 6/6 G tube placement       ASSESSMENT & PLAN:   Goals of Care / Advanced Care Planning  See IPAL note 07/02/23, 07/05/23  Note pt does NOT have FULL decision making capacity d/t waxing/waning confusion though has been more coherent past few days, defer to wife re: complex decisions/plans. See previous notes for further comment on capacity.  Pt more alert/coherent past couple days and is clear he wants to go back to his house and die at  home peacefully. Spoke w/ family at bedside yesterday 06/15, daughter, son, and their spouses were present as well as patient's wife and patient. All questions were answered. Hospice to follow. See IPAL note DNR Goals/plan are as follows: Home with hospice, need DME and need instructions on care for wounds, admin meds, cleaning, etc.  Continue tube feeds prn hunger as long as patient is lucid  Meds to treat symptoms: leaving PEG in place for med admin at home.  d/c antibiotics on discharge  Anticipate over time he will become more confused, in more pain - at this time would transition to comfort-ONLY based treatments, stop tube feeds - this was discussed at length with family    Stage IV sacral ulcer  sacral osteomyelitis wound care Educated family that wound care / pain control will be the goal, will not go home on antibiotics    Orthostatic hypotension Midodrine  to avoid dizziness/lightheadedness    Neurogenic Dysphagia, worsening Unintentional weight loss  Failure to thrive Severe malnutrition Patient underwent PEG tube placement on 06/26/2023 Continue tube feeding as long as pt/family desire I was clear w/ them that nutrition will maybe buy some time but will not result in recovery, it is maybe keeping things going for now but should stop once he gets more confused/weak.  Speech therapy and dietitian on board Palliative care on board Considering for po feedings as desired w/ recognition of aspiration risk -  I was clear w/ family we will not give him food/drink here but they can do so at home if desired Continue Foley   DM2 Continue insulin  and CBG monitoring for now Would  stop on discharge w/ reduced/minimal tube feeds   Hyperkalemia No further labs    Anemia of chronic disease baseline hgb around 10.5 No further labs   CKD stage IIIb No further labs    Chronic HFrEF Euvolemic Monitor fluid status - if edema causes respiratory distress or discomfort would treat     PAF In sinus rhythm Not on anticoagulation due to history of gross hematuria Consider beta blocker if palpitations become uncomfortable     No concerns based on BMI: Body mass index is 23.18 kg/m.Aaron Aas Significantly low or high BMI is associated with higher medical risk. However he is still malnourished  Underweight - under 18  overweight - 25 to 29 obese - 30 or more Class 1 obesity: BMI of 30.0 to 34 Class 2 obesity: BMI of 35.0 to 39 Class 3 obesity: BMI of 40.0 to 49 Super Morbid Obesity: BMI 50-59 Super-super Morbid Obesity: BMI 60+ Healthy nutrition and physical activity advised as adjunct to other disease management and risk reduction treatments    DVT prophylaxis: lovenox  IV fluids: none at this time  Nutrition: tube feeds Central lines / other devices: PEG tube, Foley  Code Status: DNR ACP documentation reviewed:  none on file in [facility]  TOC needs: DME and home hospice Medical barriers to dispo: DME to house              Subjective / Brief ROS:  Patient reports no pain right now  No questions at this time w/ patient/wife other than few clarifying details re DME delivery and dc tomorrow  Denies CP/SOB.  Denies new weakness.   Family Communication: wife is at bedside on rounds    Objective Findings:  Vitals:   07/05/23 1603 07/05/23 1957 07/06/23 0433 07/06/23 0444  BP: (!) 105/47 133/68 98/66   Pulse: 81 78 78   Resp: 14     Temp:  98.3 F (36.8 C) 97.8 F (36.6 C)   TempSrc:  Oral Oral   SpO2: 100% 100% 100%   Weight:    68 kg  Height:        Intake/Output Summary (Last 24 hours) at 07/06/2023 1500 Last data filed at 07/06/2023 0500 Gross per 24 hour  Intake 581.87 ml  Output 1750 ml  Net -1168.13 ml   Filed Weights   07/04/23 0500 07/05/23 0355 07/06/23 0444  Weight: 67.1 kg 68 kg 68 kg    Examination:  Physical Exam Constitutional:      General: He is not in acute distress.    Appearance: He is ill-appearing (thin, frail).    Cardiovascular:     Rate and Rhythm: Normal rate and regular rhythm.  Pulmonary:     Effort: Pulmonary effort is normal.     Breath sounds: Normal breath sounds.  Abdominal:     General: Abdomen is flat.     Palpations: Abdomen is soft.   Skin:    General: Skin is warm and dry.   Neurological:     Mental Status: He is alert. Mental status is at baseline.   Psychiatric:        Mood and Affect: Mood normal.        Behavior: Behavior normal.          Scheduled Medications:   vitamin C   500 mg Per Tube BID   Chlorhexidine  Gluconate Cloth  6 each Topical Q0600   enoxaparin  (LOVENOX ) injection  40 mg Subcutaneous Q24H   free water   200 mL Per Tube Q4H  insulin  aspart  0-15 Units Subcutaneous QID   insulin  aspart  5 Units Subcutaneous QID   insulin  glargine-yfgn  5 Units Subcutaneous QHS   leptospermum manuka honey  1 Application Topical Daily   midodrine   10 mg Per Tube TID WC   nutrition supplement (JUVEN)  1 packet Per Tube BID BM   sodium chloride  flush  10-40 mL Intracatheter Q12H    Continuous Infusions:  ampicillin -sulbactam (UNASYN ) IV 3 g (07/06/23 1214)   feeding supplement (OSMOLITE 1.5 CAL) 65 mL/hr at 07/05/23 1800    PRN Medications:  acetaminophen  **OR** acetaminophen , bisacodyl , ondansetron  **OR** ondansetron  (ZOFRAN ) IV, mouth rinse, oxyCODONE , polyethylene glycol, sodium chloride , sodium chloride  flush  Antimicrobials from admission:  Anti-infectives (From admission, onward)    Start     Dose/Rate Route Frequency Ordered Stop   06/27/23 1400  vancomycin  (VANCOCIN ) IVPB 1000 mg/200 mL premix  Status:  Discontinued        1,000 mg 200 mL/hr over 60 Minutes Intravenous Every 24 hours 06/26/23 1322 06/28/23 1043   06/26/23 1700  Ampicillin -Sulbactam (UNASYN ) 3 g in sodium chloride  0.9 % 100 mL IVPB        3 g 200 mL/hr over 30 Minutes Intravenous Every 6 hours 06/26/23 1558     06/26/23 0000  ceFAZolin  (ANCEF ) IVPB 2g/100 mL premix        2  g 200 mL/hr over 30 Minutes Intravenous To Radiology 06/24/23 1010 06/26/23 0348   06/23/23 1800  ceFEPIme  (MAXIPIME ) 2 g in sodium chloride  0.9 % 100 mL IVPB  Status:  Discontinued        2 g 200 mL/hr over 30 Minutes Intravenous Every 8 hours 06/23/23 1110 06/26/23 1558   06/23/23 1200  vancomycin  (VANCOREADY) IVPB 1250 mg/250 mL  Status:  Discontinued        1,250 mg 166.7 mL/hr over 90 Minutes Intravenous Every 24 hours 06/22/23 1608 06/23/23 1113   06/23/23 1200  vancomycin  (VANCOREADY) IVPB 1500 mg/300 mL  Status:  Discontinued        1,500 mg 150 mL/hr over 120 Minutes Intravenous Every 24 hours 06/23/23 1113 06/26/23 1322   06/22/23 2200  ceFEPIme  (MAXIPIME ) 2 g in sodium chloride  0.9 % 100 mL IVPB  Status:  Discontinued        2 g 200 mL/hr over 30 Minutes Intravenous Every 12 hours 06/22/23 1608 06/23/23 1110   06/22/23 1200  ceFEPIme  (MAXIPIME ) 2 g in sodium chloride  0.9 % 100 mL IVPB        2 g 200 mL/hr over 30 Minutes Intravenous  Once 06/22/23 1158 06/22/23 1302   06/22/23 1200  vancomycin  (VANCOREADY) IVPB 1500 mg/300 mL        1,500 mg 150 mL/hr over 120 Minutes Intravenous  Once 06/22/23 1158 06/22/23 1519           Data Reviewed:  I have personally reviewed the following...  CBC: Recent Labs  Lab 06/30/23 0424 07/01/23 0405 07/02/23 0357 07/03/23 0425 07/04/23 0414  WBC 10.4 8.5 8.6 8.4 7.1  NEUTROABS 8.1* 6.0  --   --   --   HGB 10.3* 8.2* 8.2* 8.3* 8.0*  HCT 32.0* 26.4* 26.4* 27.5* 26.1*  MCV 94.4 97.1 98.1 98.2 97.4  PLT 378 341 351 373 373   Basic Metabolic Panel: Recent Labs  Lab 06/30/23 0424 06/30/23 1833 07/01/23 0405 07/02/23 0357 07/03/23 0425 07/04/23 0414 07/05/23 0601 07/06/23 0412  NA 140  --  143 147* 148* 144  --   --  K 6.0* 4.6 4.1 4.1 4.2 4.2  --   --   CL 105  --  108 111 112* 109  --   --   CO2 24  --  31 29 28 27   --   --   GLUCOSE 310*  --  208* 272* 93 257*  --   --   BUN 16  --  29* 22 22 23   --   --    CREATININE 0.76  --  0.66 0.57* 0.53* 0.61  --   --   CALCIUM  8.7*  --  8.4* 8.3* 8.5* 8.4*  --   --   MG 2.0  --  2.0 2.0 1.9 1.8 1.8 2.7*  PHOS 2.7  --  2.6 2.1* 2.8 2.6 2.7 2.7   GFR: Estimated Creatinine Clearance: 66.6 mL/min (by C-G formula based on SCr of 0.61 mg/dL). Liver Function Tests: No results for input(s): AST, ALT, ALKPHOS, BILITOT, PROT, ALBUMIN in the last 168 hours.  No results for input(s): LIPASE, AMYLASE in the last 168 hours. No results for input(s): AMMONIA in the last 168 hours. Coagulation Profile: No results for input(s): INR, PROTIME in the last 168 hours.  Cardiac Enzymes: No results for input(s): CKTOTAL, CKMB, CKMBINDEX, TROPONINI in the last 168 hours. BNP (last 3 results) No results for input(s): PROBNP in the last 8760 hours. HbA1C: No results for input(s): HGBA1C in the last 72 hours.  CBG: Recent Labs  Lab 07/05/23 1153 07/05/23 1602 07/05/23 2138 07/06/23 0923 07/06/23 1443  GLUCAP 225* 167* 133* 251* 180*   Lipid Profile: No results for input(s): CHOL, HDL, LDLCALC, TRIG, CHOLHDL, LDLDIRECT in the last 72 hours. Thyroid  Function Tests: No results for input(s): TSH, T4TOTAL, FREET4, T3FREE, THYROIDAB in the last 72 hours. Anemia Panel: No results for input(s): VITAMINB12, FOLATE, FERRITIN, TIBC, IRON, RETICCTPCT in the last 72 hours. Most Recent Urinalysis On File:     Component Value Date/Time   COLORURINE YELLOW 06/22/2023 1010   APPEARANCEUR CLEAR 06/22/2023 1010   LABSPEC 1.020 06/22/2023 1010   PHURINE 5.5 06/22/2023 1010   GLUCOSEU 250 (A) 06/22/2023 1010   HGBUR MODERATE (A) 06/22/2023 1010   BILIRUBINUR NEGATIVE 06/22/2023 1010   KETONESUR NEGATIVE 06/22/2023 1010   PROTEINUR 30 (A) 06/22/2023 1010   UROBILINOGEN 0.2 04/16/2008 0202   NITRITE NEGATIVE 06/22/2023 1010   LEUKOCYTESUR SMALL (A) 06/22/2023 1010   Sepsis  Labs: @LABRCNTIP (procalcitonin:4,lacticidven:4) Microbiology: No results found for this or any previous visit (from the past 240 hours).     Radiology Studies last 3 days: No results found.   Time spent 50 min      Melodi Sprung, DO [facility] 07/06/2023, 3:00 PM    Dictation software may have been used to generate the above note. Typos may occur and escape review in typed/dictated notes. Please contact Dr Authur Leghorn directly for clarity if needed.  Staff may message me via secure chat in Epic  but this may not receive an immediate response,  please page me for urgent matters!  If 7PM-7AM, please contact night coverage [email or web address]

## 2023-07-06 NOTE — Progress Notes (Signed)
 PT Cancellation Note  Patient Details Name: Billy Franco MRN: [id number] DOB: [birth year]   Cancelled Treatment:    Reason Eval/Treat Not Completed: Other (comment) Per chart, patient to transition home with hospice with comfort care measures. Necessary DME ordered for patient and family. No further needs from PT at this point. PT will complete orders at this time.   Janine Melbourne, PT, DPT Physical Therapist - [facility]    Jarmarcus Wambold A Leilanie Rauda 07/06/2023, 8:25 AM

## 2023-07-07 DIAGNOSIS — L8990 Pressure ulcer of unspecified site, unspecified stage: Secondary | ICD-10-CM | POA: Diagnosis not present

## 2023-07-07 DIAGNOSIS — L089 Local infection of the skin and subcutaneous tissue, unspecified: Secondary | ICD-10-CM | POA: Diagnosis not present

## 2023-07-07 LAB — GLUCOSE, CAPILLARY
Glucose-Capillary: 335 mg/dL — ABNORMAL HIGH (ref 70–99)
Glucose-Capillary: 238 mg/dL — ABNORMAL HIGH (ref 70–99)

## 2023-07-07 LAB — PHOSPHORUS: Phosphorus: 3.2 mg/dL (ref 2.5–4.6)

## 2023-07-07 LAB — MAGNESIUM: Magnesium: 1.8 mg/dL (ref 1.7–2.4)

## 2023-07-07 MED ORDER — BD LUER-LOK SYRINGE 10 ML MISC: 0 refills | Status: DC

## 2023-07-07 MED ORDER — ACETAMINOPHEN 160 MG/5ML PO SOLN: 240.0000 mg | ORAL | 0 refills | Status: DC | PRN

## 2023-07-07 MED ORDER — PROMETHAZINE HCL 6.25 MG/5ML PO SOLN: 6.2500 mg | ORAL | 0 refills | Status: DC | PRN

## 2023-07-07 MED ORDER — BACLOFEN 10 MG/5ML PO SOLN: 10.0000 mg | ORAL | 0 refills | Status: DC | PRN

## 2023-07-07 MED ORDER — LORAZEPAM 2 MG/ML PO CONC: 1.0000 mg | ORAL | 0 refills | Status: DC | PRN

## 2023-07-07 MED ORDER — MORPHINE SULFATE (CONCENTRATE) 20 MG/ML PO SOLN: 10.0000 mg | ORAL | 0 refills | Status: DC | PRN

## 2023-07-07 NOTE — Discharge Summary (Signed)
 Physician Discharge Summary   Patient: Billy Franco MRN: [id number]  DOB: [birth year]   Admit:     Date of Admission: 06/22/2023 Admitted from: home   Discharge: Date of discharge: 07/07/23 Disposition: home with hospice services Condition at discharge: stable  CODE STATUS: DNR - goldenrod form signed and on chart at time of discharge      Discharge Physician: Melodi Sprung, DO [facility]     PCP: Sowles, Krichna, MD  Recommendations for Outpatient Follow-up:  Follow up with hospice services    Discharge Instructions     Discharge wound care:   Complete by: As directed    Per hospice Hospital protocol has been:  1. Cleanse sacral wound with Vashe wound cleanser Timm Foot [phone]) do not rinse and allow to air dry.  Apply Vashe moistened gauze to wound bed 2 times daily using a Q tip applicator if needed to cover any areas of depth.  Cover with dry gauze and ABD pad or silicone foam whichever is preferred. 2. Apply Medihoney to left buttock and left trochanter and bilat heels Q day, then cover with foam dressings.  Change foam dressings Q 3 days or PRN soiling         Discharge Diagnoses: Principal Problem:   Pressure ulcer Active Problems:   Stage IV pressure ulcer of sacral region (HCC)   Pressure injury of skin with infection   Protein-calorie malnutrition, severe   Sepsis [facility])       Hospital course / significant events:   Billy Franco is a 83 y.o. 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   Underlying issue is sequelae from cervical nerve  compression. Upon SLP, barium swallow study- patient has severe pharyngeal musculature weakness and high risk of aspiration - PEG placed here.   Orthostatic hypotension impacting PT participation and was on multiple antihypertensives and midodrine  simultaneously at home. Holding all anti-HTN meds he is on borderline lower limits of normal BP and was able to participate with PT.   Over course of GOC discussions w/ patient, his wife, palliative care team, hospitalist team and family - See IPAL note and progress notes 07/02/23, 07/03/23. Family meeting 07/05/23 - Plan now for home with hospice      Consultants:  General surgery Neurosurgery  Infectious disease Interventional radiology  Palliative care   Procedures/Surgeries: 6/6 G tube placement       ASSESSMENT & PLAN:   Goals of Care / Advanced Care Planning  See IPAL note 07/02/23, 07/05/23  Note pt does NOT have FULL decision making capacity d/t waxing/waning confusion though has been more coherent past few days, defer to wife re: complex decisions/plans. See previous notes for further comment on capacity.  Pt more alert/coherent past couple days and is clear he wants to go back to his house and die at home peacefully. Spoke w/ family at bedside yesterday 06/15, daughter, son, and their spouses were present as well as patient's wife and patient. All questions were answered. Hospice to follow. See IPAL note DNR Goals/plan are as follows: Home with hospice, need DME and need instructions on care for wounds, admin meds, cleaning, etc.  Continue tube  feeds prn hunger as long as patient is lucid  Meds to treat symptoms: leaving PEG in place for med admin at home.  d/c antibiotics on discharge  Anticipate over time he will become more confused, in more pain - at this time would transition to comfort-ONLY based treatments, stop tube feeds - this was discussed at length with family   Continue Foley cath  Stage IV sacral ulcer  sacral  osteomyelitis wound care Educated family that wound care / pain control will be the goal, will not go home on antibiotics   Neurogenic Dysphagia, worsening Unintentional weight loss  Failure to thrive Severe malnutrition Patient underwent PEG tube placement on 06/26/2023 Continue tube feeding as long as pt/family desire I was clear w/ them that nutrition will maybe buy some time but will not result in recovery, it is maybe keeping things going for now but should stop once he gets more confused/weak.  Speech therapy and dietitian on board Palliative care on board Considering for po feedings as desired w/ recognition of aspiration risk -  I was clear w/ family we will not give him food/drink here but they can do so at home if desired   DM2 Dc meds  Hyperkalemia No further labs    Anemia of chronic disease baseline hgb around 10.5 No further labs   CKD stage IIIb No further labs    Chronic HFrEF Euvolemic Monitor fluid status - if edema causes respiratory distress or discomfort would treat    PAF In sinus rhythm Not on anticoagulation due to history of gross hematuria Consider beta blocker if palpitations become uncomfortable     No concerns based on BMI: Body mass index is 23.18 kg/m.Billy Franco Significantly low or high BMI is associated with higher medical risk. However he is still malnourished             Discharge Instructions  Allergies as of 07/07/2023       Reactions   Codeine Rash, Other (See Comments)   Per pt hard on my kidneys    Lovastatin Rash, Other (See Comments)   Tape Rash        Medication List     STOP taking these medications    acetaminophen  650 MG CR tablet Commonly known as: TYLENOL  Replaced by: acetaminophen  160 MG/5ML solution   aspirin  EC 81 MG tablet   atorvastatin  10 MG tablet Commonly known as: LIPITOR   B-12 1000 MCG Tbcr   blood glucose meter kit and supplies   carvedilol  3.125 MG tablet Commonly known as: COREG     clopidogrel  75 MG tablet Commonly known as: PLAVIX    docusate sodium  50 MG capsule Commonly known as: COLACE   furosemide  40 MG tablet Commonly known as: LASIX    Gaviscon 95-358 MG/15ML Susp Generic drug: aluminum hydroxide-magnesium  carbonate   glipiZIDE  2.5 MG Tabs   levofloxacin 750 MG tablet Commonly known as: LEVAQUIN   levothyroxine  25 MCG tablet Commonly known as: SYNTHROID    losartan  25 MG tablet Commonly known as: COZAAR    magnesium  oxide 400 MG tablet Commonly known as: MAG-OX   metFORMIN  750 MG 24 hr tablet Commonly known as: GLUCOPHAGE -XR   midodrine  10 MG tablet Commonly known as: PROAMATINE    multivitamin with minerals Tabs tablet   ondansetron  4 MG tablet Commonly known as: ZOFRAN    OneTouch Delica Plus Lancet33G Misc   OneTouch Ultra test strip Generic drug: glucose blood   pantoprazole  40 MG tablet Commonly known as: PROTONIX    polyethylene glycol powder 17 GM/SCOOP  powder Commonly known as: GLYCOLAX /MIRALAX    rOPINIRole  2 MG tablet Commonly known as: REQUIP    senna 8.6 MG Tabs tablet Commonly known as: SENOKOT   sodium hypochlorite 0.5 % Soln Commonly known as: DAKIN'S FULL STRENGTH   spironolactone  25 MG tablet Commonly known as: ALDACTONE    sulfamethoxazole-trimethoprim 800-160 MG tablet Commonly known as: BACTRIM DS   tamsulosin  0.4 MG Caps capsule Commonly known as: FLOMAX    THERALITH XR PO   traMADol  50 MG tablet Commonly known as: ULTRAM    triamcinolone  cream 0.1 % Commonly known as: KENALOG    Vitamin D -1000 Max St 25 MCG (1000 UT) tablet Generic drug: Cholecalciferol       TAKE these medications    acetaminophen  160 MG/5ML solution Commonly known as: TYLENOL  Place 7.5 mLs (240 mg total) into feeding tube every 4 (four) hours as needed for mild pain (pain score 1-3), moderate pain (pain score 4-6), fever or headache. Replaces: acetaminophen  650 MG CR tablet   Baclofen 10 MG/5ML Soln Place 10 mg into  feeding tube every 4 (four) hours as needed (muscle spasm).   BD Luer-Lok Syringe 10 ML Misc Generic drug: Syringe (Disposable) Use as directed to administer medications via PEG tube   LORazepam 2 MG/ML concentrated solution Commonly known as: ATIVAN Place 0.5 mLs (1 mg total) into feeding tube every 4 (four) hours as needed for anxiety, sedation or sleep.   morphine  20 MG/ML concentrated solution Commonly known as: ROXANOL Place 0.5 mLs (10 mg total) into feeding tube every 4 (four) hours as needed for severe pain (pain score 7-10). May give sublingually if needed.   promethazine 6.25 MG/5ML solution Commonly known as: PHENERGAN Place 5 mLs (6.25 mg total) into feeding tube every 4 (four) hours as needed for nausea or vomiting.               Discharge Care Instructions  (From admission, onward)           Start     Ordered   07/07/23 0000  Discharge wound care:       Comments: Per hospice Hospital protocol has been:  1. Cleanse sacral wound with Vashe wound cleanser Timm Foot [phone]) do not rinse and allow to air dry.  Apply Vashe moistened gauze to wound bed 2 times daily using a Q tip applicator if needed to cover any areas of depth.  Cover with dry gauze and ABD pad or silicone foam whichever is preferred. 2. Apply Medihoney to left buttock and left trochanter and bilat heels Q day, then cover with foam dressings.  Change foam dressings Q 3 days or PRN soiling   07/07/23 0955              Allergies  Allergen Reactions   Codeine Rash and Other (See Comments)    Per pt hard on my kidneys     Lovastatin Rash and Other (See Comments)   Tape Rash     Subjective: pt feeling sore this morning and asking for pain meds/repositioning but otherwise no concerns from patient or his wife.    Discharge Exam: BP 106/66 (BP Location: Left Wrist)   Pulse 78   Temp 98.2 F (36.8 C) (Oral)   Resp 16   Ht 5' 7 (1.702 m)   Wt 68 kg   SpO2 100%   BMI 23.48 kg/m   General: Pt is alert, awake, not in acute distress Cardiovascular: RRR, S1/S2 +, no rubs, no gallops Respiratory: CTA bilaterally, no wheezing, no rhonchi Abdominal: Soft,  NT, ND Extremities: no edema, no cyanosis     The results of significant diagnostics from this hospitalization (including imaging, microbiology, ancillary and laboratory) are listed below for reference.     Microbiology: No results found for this or any previous visit (from the past 240 hours).   Labs: BNP (last 3 results) Recent Labs    06/22/23 1018  BNP 203.8*   Basic Metabolic Panel: Recent Labs  Lab 06/30/23 1833 07/01/23 0405 07/01/23 0405 07/02/23 0357 07/03/23 0425 07/04/23 0414 07/05/23 0601 07/06/23 0412 07/07/23 0300  NA  --  143  --  147* 148* 144  --   --   --   K 4.6 4.1  --  4.1 4.2 4.2  --   --   --   CL  --  108  --  111 112* 109  --   --   --   CO2  --  31  --  29 28 27   --   --   --   GLUCOSE  --  208*  --  272* 93 257*  --   --   --   BUN  --  29*  --  22 22 23   --   --   --   CREATININE  --  0.66  --  0.57* 0.53* 0.61  --   --   --   CALCIUM   --  8.4*  --  8.3* 8.5* 8.4*  --   --   --   MG  --  2.0   < > 2.0 1.9 1.8 1.8 2.7* 1.8  PHOS  --  2.6   < > 2.1* 2.8 2.6 2.7 2.7 3.2   < > = values in this interval not displayed.   Liver Function Tests: No results for input(s): AST, ALT, ALKPHOS, BILITOT, PROT, ALBUMIN in the last 168 hours. No results for input(s): LIPASE, AMYLASE in the last 168 hours. No results for input(s): AMMONIA in the last 168 hours. CBC: Recent Labs  Lab 07/01/23 0405 07/02/23 0357 07/03/23 0425 07/04/23 0414  WBC 8.5 8.6 8.4 7.1  NEUTROABS 6.0  --   --   --   HGB 8.2* 8.2* 8.3* 8.0*  HCT 26.4* 26.4* 27.5* 26.1*  MCV 97.1 98.1 98.2 97.4  PLT 341 351 373 373   Cardiac Enzymes: No results for input(s): CKTOTAL, CKMB, CKMBINDEX, TROPONINI in the last 168 hours. BNP: Invalid input(s): POCBNP CBG: Recent Labs  Lab  07/06/23 0923 07/06/23 1443 07/06/23 1726 07/06/23 2134 07/07/23 0817  GLUCAP 251* 180* 184* 77 335*   D-Dimer No results for input(s): DDIMER in the last 72 hours. Hgb A1c No results for input(s): HGBA1C in the last 72 hours. Lipid Profile No results for input(s): CHOL, HDL, LDLCALC, TRIG, CHOLHDL, LDLDIRECT in the last 72 hours. Thyroid  function studies No results for input(s): TSH, T4TOTAL, T3FREE, THYROIDAB in the last 72 hours.  Invalid input(s): FREET3 Anemia work up No results for input(s): VITAMINB12, FOLATE, FERRITIN, TIBC, IRON, RETICCTPCT in the last 72 hours. Urinalysis    Component Value Date/Time   COLORURINE YELLOW 06/22/2023 1010   APPEARANCEUR CLEAR 06/22/2023 1010   LABSPEC 1.020 06/22/2023 1010   PHURINE 5.5 06/22/2023 1010   GLUCOSEU 250 (A) 06/22/2023 1010   HGBUR MODERATE (A) 06/22/2023 1010   BILIRUBINUR NEGATIVE 06/22/2023 1010   KETONESUR NEGATIVE 06/22/2023 1010   PROTEINUR 30 (A) 06/22/2023 1010   UROBILINOGEN 0.2 04/16/2008 0202   NITRITE NEGATIVE 06/22/2023 1010  LEUKOCYTESUR SMALL (A) 06/22/2023 1010   Sepsis Labs Recent Labs  Lab 07/01/23 0405 07/02/23 0357 07/03/23 0425 07/04/23 0414  WBC 8.5 8.6 8.4 7.1   Microbiology No results found for this or any previous visit (from the past 240 hours). Imaging DG Swallowing Func-Speech Pathology Result Date: 06/23/2023 Table formatting from the original result was not included. Modified Barium Swallow Study Patient Details Name: Cordae Mccarey MRN: [id number] Date of Birth: May 28, 1940 Today's Date: 06/23/2023 HPI/PMH: HPI: Karanvir Balderston is a 83 y.o. 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 Pt and his wife report that spent most of the last 5-6 weeks bedbound, was not able to participate in PT d/t low blood pressure but did participate in PT/OT. He also reported swallowing problems, his diet has been downgraded to soft however family has witnessed several episodes of choking and coughing after eating.  His appetite decreased significantly and estimated at least several pounds of weight loss compared to last month. Clinical Impression: Pt is known to ST services following his ACDF C3-5 (4/21) and intubation (4/23-4/15) and was discharged on 05/21/2023 consuming regular diet with thin liquids.      Upon readmission to [facility] on 06/22/2023 he is on a puree diet with liquid medicines.This writter called his wife to get interim history. She reports that he has something in his throat and tomorrow he was going to see a throat doctor. With more questions she reports that pt had an appt with Dr [facility]) tomorrow because something is wrong with his throat, we are not sure if it is where he had his incision (for ACDF) or where he had a blockage in 2015. She reports that he did work with a Human resources officer at [employer] who had downgraded his diet d/t something wrong with his throat.      Given the above report and recent surgical history, this writer felt instrumental swallow study would provide the most information.      This Clinical research associate met briefly with pt at bedside prior to transportation arriving. Education provided to pt on current plan. Pt was not able to tolerate sitting upright in bed d/t pain but was hopeful he could sit in fluro chair for short period of time.  Pt arrived to [employer] suite with [place] J hard collar in place and was able to tolerate sitting upright in fluro chair without complaint of increased pain.  Pt presents with profound sensorimotor pharyngeal phase dysphagia placing him at high risk for developing aspiraiton  pneumonia, dehydration and malnutrition.      Pt's sensory response to boluses is mildly delayed with overall initiation occuring as boluses fall from the vallecula into the pyriform sinuses. As a result, pt with intermittent laryngeal penetration and aspiration prior to the swallow as airway remained open.  Pt's pharyngeal motor response is also impaired. Pt with reduced epiglottic deflection resulting in the epiglottis abutting posterior pharyngeal wall during majority of boluses allowing for laryngeal penetration and silent aspiration during the swallow. In addition, pt with significantly reduced pharyngeal stripping and bolus passage thru the UES resulting in moderate amounts of pharyngeal residue especially with thicker consistencies remaining after the swallow. Pt was not sensate to residue. Pt largely unable to clear  residue despite cues for multiple swallows.  At this time, most conservative diet would be NPO with ice chips to facilitate use of swallow musculature.     Information provided to pt's treatment team, including neurosurgery, to aid in developing POC in accordance with pt's desires. ST to follow acutely. Factors that may increase risk of adverse event in presence of aspiration Roderick Civatte & Jessy Morocco 2021): Factors that may increase risk of adverse event in presence of aspiration Roderick Civatte & Jessy Morocco 2021): Reduced cognitive function; Frail or deconditioned; Dependence for feeding and/or oral hygiene; Inadequate oral hygiene; Weak cough; Poor general health and/or compromised immunity; Limited mobility Recommendations/Plan: Swallowing Evaluation Recommendations Swallowing Evaluation Recommendations Recommendations: NPO; Ice chips PRN after oral care Medication Administration: Via alternative means Supervision: Full supervision/cueing for swallowing strategies; Full assist for feeding (when consuming ice chips) Swallowing strategies  : Minimize environmental distractions; Slow rate; Small bites/sips  Postural changes: Position pt fully upright for meals Oral care recommendations: Oral care before ice chips/water ; Oral care QID (4x/day) Recommended consults: -- (Neurosurgery, possible Palliative Care) Caregiver Recommendations: Avoid jello, ice cream, thin soups, popsicles; Remove water  pitcher; Have oral suction available Treatment Plan Treatment Plan Treatment recommendations: Therapy as outlined in treatment plan below Follow-up recommendations: Skilled nursing-short term rehab (<3 hours/day) Functional status assessment: Patient has had a recent decline in their functional status and/or demonstrates limited ability to make significant improvements in function in a reasonable and predictable amount of time. Treatment frequency: Min 2x/week Treatment duration: 2 weeks Interventions: Patient/family education; Aspiration precaution training Recommendations Recommendations for follow up therapy are one component of a multi-disciplinary discharge planning process, led by the attending physician.  Recommendations may be updated based on patient status, additional functional criteria and insurance authorization. Assessment: Orofacial Exam: Orofacial Exam Oral Cavity: Oral Hygiene: WFL Oral Cavity - Dentition: Poor condition; Missing dentition Orofacial Anatomy: WFL Oral Motor/Sensory Function: WFL Anatomy: Anatomy: Presence of cervical hardware (ACDF hardware present) Boluses Administered: Boluses Administered Boluses Administered: Thin liquids (Level 0); Mildly thick liquids (Level 2, nectar thick); Puree  Oral Impairment Domain: Oral Impairment Domain Lip Closure: No labial escape Tongue control during bolus hold: Not tested Bolus preparation/mastication: Slow prolonged chewing/mashing with complete recollection Bolus transport/lingual motion: Slow tongue motion Oral residue: Trace residue lining oral structures Location of oral residue : Tongue Initiation of pharyngeal swallow : Valleculae; Pyriform sinuses   Pharyngeal Impairment Domain: Pharyngeal Impairment Domain Soft palate elevation: No bolus between soft palate (SP)/pharyngeal wall (PW) Laryngeal elevation: Minimal superior movement of thyroid  cartilage with minimal approximation of arytenoids to epiglottic petiole; Partial superior movement of thyroid  cartilage/partial approximation of arytenoids to epiglottic petiole Anterior hyoid excursion: Partial anterior movement Epiglottic movement: Partial inversion; No inversion Laryngeal vestibule closure: Incomplete, narrow column air/contrast in laryngeal vestibule Pharyngeal stripping wave : Present - diminished Pharyngoesophageal segment opening: Minimal distention/minimal duration, marked obstruction of flow Tongue base retraction: Trace column of contrast or air between tongue base and PPW Pharyngeal residue: Majority of contrast within or on pharyngeal structures Location of pharyngeal residue: Valleculae; Pharyngeal wall; Pyriform sinuses  Esophageal Impairment Domain: Esophageal Impairment Domain Esophageal clearance upright position: Esophageal retention Pill: No data recorded Penetration/Aspiration Scale Score: Penetration/Aspiration Scale Score 4.  Material enters airway, CONTACTS cords then ejected out: Thin liquids (Level 0); Puree; Mildly thick liquids (Level 2, nectar thick) 5.  Material enters airway, CONTACTS cords and not ejected out: Thin liquids (Level 0); Mildly thick liquids (Level 2, nectar thick); Puree 6.  Material enters airway, passes BELOW cords then  ejected out: Thin liquids (Level 0); Mildly thick liquids (Level 2, nectar thick); Puree 8.  Material enters airway, passes BELOW cords without attempt by patient to eject out (silent aspiration) : Thin liquids (Level 0); Mildly thick liquids (Level 2, nectar thick); Puree Compensatory Strategies: No data recorded  General Information: Caregiver present: No  Diet Prior to this Study: Dysphagia 1 (pureed); Thin liquids (Level 0)   Temperature :  Normal   Respiratory Status: WFL   Supplemental O2: None (Room air)   History of Recent Intubation: No  Behavior/Cognition: Alert; Cooperative; Pleasant mood Self-Feeding Abilities: Dependent for feeding Baseline vocal quality/speech: Normal Volitional Cough: Able to elicit Volitional Swallow: Able to elicit Exam Limitations: No limitations Goal Planning: Prognosis for improved oropharyngeal function: Guarded Barriers to Reach Goals: Time post onset; Severity of deficits; Overall medical prognosis (wound) No data recorded Patient/Family Stated Goal: are you going to figue out what is going on in my throat Consulted and agree with results and recommendations: Patient; Physician; Nurse; Dietitian (neurosurgery) Pain: Pain Assessment Pain Assessment: 0-10 Pain Score: 8 Pain Location: sacral wound Pain Descriptors / Indicators: Aching Pain Intervention(s): Limited activity within patient's tolerance; Monitored during session; Premedicated before session; Repositioned End of Session: Start Time:SLP Start Time (ACUTE ONLY): 1138 Stop Time: SLP Stop Time (ACUTE ONLY): 1152 Time Calculation:SLP Time Calculation (min) (ACUTE ONLY): 14 min Charges: SLP Evaluations $ SLP Speech Visit: 1 Visit SLP Evaluations $MBS Swallow: 1 Procedure SLP visit diagnosis: SLP Visit Diagnosis: Dysphagia, pharyngeal phase (R13.13) Past Medical History: Past Medical History: Diagnosis Date  Arthritis   Lower back, hips  Atrial fibrillation (HCC)   CHF (congestive heart failure) (HCC)   CKD (chronic kidney disease), stage III (HCC)   Complication of anesthesia   has crooked airway  Diff breathing after extubated (after kidney stone procedure 07/2013)  Diabetes mellitus without complication (HCC)   GERD (gastroesophageal reflux disease)   Hypertension   Hypothyroidism   Nephrolithiasis  Past Surgical History: Past Surgical History: Procedure Laterality Date  ANTERIOR CERVICAL DECOMP/DISCECTOMY FUSION N/A 05/11/2023  Procedure: ANTERIOR CERVICAL  DECOMPRESSION/DISCECTOMY FUSION 2 LEVELS;  Surgeon: Jodeen Munch, MD;  Location: [facility] ORS;  Service: Neurosurgery;  Laterality: N/A;  CATARACT EXTRACTION W/PHACO Right 05/07/2021  Procedure: CATARACT EXTRACTION PHACO AND INTRAOCULAR LENS PLACEMENT (IOC) RIGHT DIABETIC 8.63 00:56.5;  Surgeon: Clair Crews, MD;  Location: [facility];  Service: Ophthalmology;  Laterality: Right;  Diabetic  CATARACT EXTRACTION W/PHACO Left 05/21/2021  Procedure: CATARACT EXTRACTION PHACO AND INTRAOCULAR LENS PLACEMENT (IOC) LEFT DIABETIC 7.27 01:01.5;  Surgeon: Clair Crews, MD;  Location: [facility];  Service: Ophthalmology;  Laterality: Left;  Diabetic  CORONARY STENT INTERVENTION N/A 03/08/2020  Procedure: CORONARY STENT INTERVENTION;  Surgeon: Antonette Batters, MD;  Location: [facility] INVASIVE CV LAB;  Service: Cardiovascular;  Laterality: N/A;  LEFT HEART CATH AND CORONARY ANGIOGRAPHY Left 03/08/2020  Procedure: LEFT HEART CATH AND CORONARY ANGIOGRAPHY;  Surgeon: Antonette Batters, MD;  Location: [facility] INVASIVE CV LAB;  Service: Cardiovascular;  Laterality: Left;  NASAL FRACTURE SURGERY  1994  URETEROLITHOTOMY Right 2015 Happi Overton 06/23/2023, 2:11 PM  CT ABDOMEN PELVIS W CONTRAST Result Date: 06/22/2023 CLINICAL DATA:  Sacral ulcer with purulent discharge EXAM: CT ABDOMEN AND PELVIS WITH CONTRAST TECHNIQUE: Multidetector CT imaging of the abdomen and pelvis was performed using the standard protocol following bolus administration of intravenous contrast. RADIATION DOSE REDUCTION: This exam was performed according to the departmental dose-optimization program which includes automated exposure control, adjustment of the mA and/or kV  according to patient size and/or use of iterative reconstruction technique. CONTRAST:  OMNIPAQUE  IOHEXOL  300 MG/ML  SOLN COMPARISON:  October 08, 2022 FINDINGS: Lower chest: Minimal basilar right lower lobe hypoventilatory chronic atelectatic changes without  infiltrates or consolidations Hepatobiliary: No focal liver abnormality is seen. No gallstones, gallbladder wall thickening, or biliary dilatation. Pancreas: Unremarkable. No pancreatic ductal dilatation or surrounding inflammatory changes. Spleen: Normal in size without focal abnormality. Adrenals/Urinary Tract: No adrenal gland masses. Right kidney is unremarkable The left kidney demonstrates a 23 x 6 mm stone in the left renal pelvis and a 7 x 7 mm stone in the left kidney lower pole calices without hydronephrosis these findings are unchanged since prior. Stomach/Bowel: Stomach is within normal limits. Appendix appears normal. No evidence of bowel wall thickening, distention, or inflammatory changes. Abundant residual fecal material colon. Diffuse diverticulosis colon without diverticulitis. Vascular/Lymphatic: No significant vascular findings are present. No enlarged abdominal or pelvic lymph nodes. Reproductive: Prostate is unremarkable. Other: No abdominal wall hernia or abnormality. No abdominopelvic ascites. Musculoskeletal: Sacral ulcer with exposure of the tip of the Cox's correlate with chronic decubitus ulcer and associated osteomyelitis. Findings unchanged since prior examination no evidence of drainable abscesses or fluid collections. IMPRESSION: *Sacral ulcer with exposure of the tip of the Cox's correlate with chronic decubitus ulcer and associated osteomyelitis. Findings unchanged since prior examination no evidence of drainable abscesses or fluid collections. *Left nephrolithiasis without hydronephrosis. *Abundant residual fecal material colon. *Diffuse diverticulosis colon without diverticulitis. *Aortic atherosclerosis. Electronically Signed   By: Fredrich Jefferson M.D.   On: 06/22/2023 13:28   DG Chest 1 View Result Date: 06/22/2023 CLINICAL DATA:  Weakness. EXAM: CHEST  1 VIEW COMPARISON:  Radiographs 05/13/2023 and 05/10/2023.  CT 05/13/2023. FINDINGS: 1020 hours. Interval extubation. PICC line  has been removed. The heart size and mediastinal contours are stable. The lungs appear clear. No significant residual pleural effusions identified. The bones appear unchanged. IMPRESSION: Interval extubation. No evidence of acute cardiopulmonary process. Electronically Signed   By: Elmon Hagedorn M.D.   On: 06/22/2023 11:32      Time coordinating discharge: over 30 minutes  SIGNED:  Alvilda Mckenna DO [facility]

## 2023-07-07 NOTE — TOC Transition Note (Signed)
 Transition of Care [facility]) - Discharge Note   Patient Details  Name: Billy Franco MRN: [id number] Date of Birth: 20-Feb-1940  [facility] CM/SW Contact:  Loman Risk, RN Phone Number: 07/07/2023, 3:58 PM   Clinical Narrative:      Patient discharge home with [facility] today.  DME to be delivered at 1230.  Nurse visit schedule for 3pm  Marylou Sobers with [employer] aware Wife and patient in agreement [facility] arranged for 1pm        Patient Goals and CMS Choice            Discharge Placement                       Discharge Plan and Services Additional resources added to the After Visit Summary for                                       Social Drivers of Health (SDOH) Interventions SDOH Screenings   Food Insecurity: No Food Insecurity (06/22/2023)  Housing: Low Risk  (06/22/2023)  Transportation Needs: No Transportation Needs (06/22/2023)  Utilities: Not At Risk (06/22/2023)  Alcohol Screen: Low Risk  (01/24/2019)  Depression (PHQ2-9): Low Risk  (02/02/2023)  Financial Resource Strain: Low Risk  (01/24/2019)  Physical Activity: Sufficiently Active (01/24/2019)  Social Connections: Socially Integrated (06/22/2023)  Stress: No Stress Concern Present (01/24/2019)  Tobacco Use: Low Risk  (06/22/2023)     Readmission Risk Interventions     No data to display

## 2023-07-07 NOTE — Plan of Care (Signed)
  Problem: Coping: Goal: Ability to adjust to condition or change in health will improve Outcome: Progressing   Problem: Fluid Volume: Goal: Ability to maintain a balanced intake and output will improve Outcome: Progressing   Problem: Nutritional: Goal: Maintenance of adequate nutrition will improve Outcome: Progressing   Problem: Skin Integrity: Goal: Risk for impaired skin integrity will decrease Outcome: Not Progressing
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
## 2023-07-08 ENCOUNTER — Telehealth: Payer: Self-pay | Admitting: Neurosurgery

## 2023-07-08 NOTE — Telephone Encounter (Signed)
 Patient's wife called stating that her husband is under hospice care and will need to cancel all of the future post-op appointments.

## 2023-07-16 ENCOUNTER — Encounter: Admitting: Neurosurgery

## 2023-07-21 DEATH — deceased

## 2023-07-30 ENCOUNTER — Encounter: Admitting: Neurosurgery

## 2023-08-11 ENCOUNTER — Encounter: Admitting: Neurosurgery

## 2023-08-12 ENCOUNTER — Ambulatory Visit: Payer: Medicare Other | Admitting: Urology

## 2023-08-14 ENCOUNTER — Encounter: Payer: Self-pay | Admitting: Neurosurgery

## 2023-08-17 ENCOUNTER — Ambulatory Visit: Payer: Medicare Other | Admitting: Urology

## 2023-08-19 ENCOUNTER — Ambulatory Visit: Payer: Self-pay | Admitting: Urology
